# Patient Record
Sex: Female | Born: 1945 | Race: White | Hispanic: No | Marital: Married | Smoking: Former smoker
Health system: Southern US, Community
[De-identification: ages and names within clinical notes are randomized; demographics above are authoritative.]

## PROBLEM LIST (undated history)

## (undated) DIAGNOSIS — E1140 Type 2 diabetes mellitus with diabetic neuropathy, unspecified: Secondary | ICD-10-CM

## (undated) DIAGNOSIS — N390 Urinary tract infection, site not specified: Secondary | ICD-10-CM

## (undated) DIAGNOSIS — F419 Anxiety disorder, unspecified: Secondary | ICD-10-CM

## (undated) DIAGNOSIS — E785 Hyperlipidemia, unspecified: Secondary | ICD-10-CM

## (undated) DIAGNOSIS — E119 Type 2 diabetes mellitus without complications: Secondary | ICD-10-CM

## (undated) DIAGNOSIS — K0889 Other specified disorders of teeth and supporting structures: Secondary | ICD-10-CM

## (undated) DIAGNOSIS — E113591 Type 2 diabetes mellitus with proliferative diabetic retinopathy without macular edema, right eye: Secondary | ICD-10-CM

## (undated) DIAGNOSIS — Z89511 Acquired absence of right leg below knee: Secondary | ICD-10-CM

## (undated) DIAGNOSIS — W010XXA Fall on same level from slipping, tripping and stumbling without subsequent striking against object, initial encounter: Secondary | ICD-10-CM

## (undated) DIAGNOSIS — D649 Anemia, unspecified: Secondary | ICD-10-CM

## (undated) DIAGNOSIS — E049 Nontoxic goiter, unspecified: Secondary | ICD-10-CM

## (undated) DIAGNOSIS — I509 Heart failure, unspecified: Secondary | ICD-10-CM

## (undated) DIAGNOSIS — K5900 Constipation, unspecified: Secondary | ICD-10-CM

## (undated) DIAGNOSIS — K219 Gastro-esophageal reflux disease without esophagitis: Secondary | ICD-10-CM

## (undated) DIAGNOSIS — I10 Essential (primary) hypertension: Secondary | ICD-10-CM

## (undated) DIAGNOSIS — I7300 Raynaud's syndrome without gangrene: Secondary | ICD-10-CM

## (undated) DIAGNOSIS — R112 Nausea with vomiting, unspecified: Secondary | ICD-10-CM

## (undated) DIAGNOSIS — N200 Calculus of kidney: Secondary | ICD-10-CM

## (undated) DIAGNOSIS — K429 Umbilical hernia without obstruction or gangrene: Secondary | ICD-10-CM

## (undated) DIAGNOSIS — I6529 Occlusion and stenosis of unspecified carotid artery: Secondary | ICD-10-CM

## (undated) DIAGNOSIS — E1149 Type 2 diabetes mellitus with other diabetic neurological complication: Secondary | ICD-10-CM

## (undated) DIAGNOSIS — Z9289 Personal history of other medical treatment: Secondary | ICD-10-CM

## (undated) DIAGNOSIS — Z720 Tobacco use: Secondary | ICD-10-CM

## (undated) DIAGNOSIS — E559 Vitamin D deficiency, unspecified: Secondary | ICD-10-CM

## (undated) DIAGNOSIS — I219 Acute myocardial infarction, unspecified: Secondary | ICD-10-CM

## (undated) DIAGNOSIS — I739 Peripheral vascular disease, unspecified: Secondary | ICD-10-CM

## (undated) DIAGNOSIS — G458 Other transient cerebral ischemic attacks and related syndromes: Secondary | ICD-10-CM

## (undated) DIAGNOSIS — I2510 Atherosclerotic heart disease of native coronary artery without angina pectoris: Secondary | ICD-10-CM

## (undated) DIAGNOSIS — N209 Urinary calculus, unspecified: Secondary | ICD-10-CM

## (undated) DIAGNOSIS — Z9889 Other specified postprocedural states: Secondary | ICD-10-CM

## (undated) HISTORY — DX: Essential (primary) hypertension: I10

## (undated) HISTORY — DX: Other transient cerebral ischemic attacks and related syndromes: G45.8

## (undated) HISTORY — PX: CAROTID ENDARTERECTOMY: SUR193

## (undated) HISTORY — DX: Nontoxic goiter, unspecified: E04.9

## (undated) HISTORY — PX: ABDOMINAL HYSTERECTOMY: SHX81

## (undated) HISTORY — DX: Type 2 diabetes mellitus with diabetic neuropathy, unspecified: E11.40

## (undated) HISTORY — DX: Peripheral vascular disease, unspecified: I73.9

## (undated) HISTORY — PX: TONSILLECTOMY AND ADENOIDECTOMY: SUR1326

## (undated) HISTORY — DX: Type 2 diabetes mellitus with other diabetic neurological complication: E11.49

## (undated) HISTORY — DX: Vitamin D deficiency, unspecified: E55.9

## (undated) HISTORY — DX: Fall on same level from slipping, tripping and stumbling without subsequent striking against object, initial encounter: W01.0XXA

## (undated) HISTORY — DX: Raynaud's syndrome without gangrene: I73.00

## (undated) HISTORY — PX: CORONARY ANGIOPLASTY WITH STENT PLACEMENT: SHX49

## (undated) HISTORY — DX: Acquired absence of right leg below knee: Z89.511

## (undated) HISTORY — DX: Tobacco use: Z72.0

## (undated) HISTORY — DX: Hyperlipidemia, unspecified: E78.5

## (undated) HISTORY — DX: Occlusion and stenosis of unspecified carotid artery: I65.29

## (undated) HISTORY — PX: FEMORAL ARTERY - POPLITEAL ARTERY BYPASS GRAFT: SUR180

## (undated) HISTORY — DX: Atherosclerotic heart disease of native coronary artery without angina pectoris: I25.10

## (undated) HISTORY — PX: VESICOVAGINAL FISTULA CLOSURE W/ TAH: SUR271

## (undated) HISTORY — DX: Type 2 diabetes mellitus with proliferative diabetic retinopathy without macular edema, right eye: E11.3591

## (undated) HISTORY — PX: HERNIA REPAIR: SHX51

## (undated) HISTORY — PX: BREAST LUMPECTOMY: SHX2

---

## 1999-05-24 ENCOUNTER — Other Ambulatory Visit: Admission: RE | Admit: 1999-05-24 | Discharge: 1999-05-24 | Payer: Self-pay | Admitting: Obstetrics and Gynecology

## 2000-08-16 ENCOUNTER — Other Ambulatory Visit: Admission: RE | Admit: 2000-08-16 | Discharge: 2000-08-16 | Payer: Self-pay | Admitting: Obstetrics and Gynecology

## 2001-07-05 ENCOUNTER — Encounter: Payer: Self-pay | Admitting: Emergency Medicine

## 2001-07-05 ENCOUNTER — Inpatient Hospital Stay (HOSPITAL_COMMUNITY): Admission: EM | Admit: 2001-07-05 | Discharge: 2001-07-06 | Payer: Self-pay | Admitting: Emergency Medicine

## 2001-07-07 ENCOUNTER — Encounter: Payer: Self-pay | Admitting: Emergency Medicine

## 2001-07-07 ENCOUNTER — Inpatient Hospital Stay (HOSPITAL_COMMUNITY): Admission: EM | Admit: 2001-07-07 | Discharge: 2001-07-11 | Payer: Self-pay | Admitting: Emergency Medicine

## 2001-07-08 ENCOUNTER — Encounter: Payer: Self-pay | Admitting: Internal Medicine

## 2001-07-10 ENCOUNTER — Encounter: Payer: Self-pay | Admitting: Cardiology

## 2001-07-10 ENCOUNTER — Encounter: Payer: Self-pay | Admitting: Internal Medicine

## 2003-09-02 ENCOUNTER — Encounter (INDEPENDENT_AMBULATORY_CARE_PROVIDER_SITE_OTHER): Payer: Self-pay | Admitting: Specialist

## 2003-09-02 ENCOUNTER — Inpatient Hospital Stay (HOSPITAL_COMMUNITY): Admission: RE | Admit: 2003-09-02 | Discharge: 2003-09-03 | Payer: Self-pay | Admitting: Vascular Surgery

## 2004-02-03 ENCOUNTER — Encounter: Admission: RE | Admit: 2004-02-03 | Discharge: 2004-03-30 | Payer: Self-pay | Admitting: Endocrinology

## 2004-06-21 ENCOUNTER — Ambulatory Visit: Payer: Self-pay | Admitting: Internal Medicine

## 2004-06-29 ENCOUNTER — Ambulatory Visit: Payer: Self-pay | Admitting: Internal Medicine

## 2005-05-20 ENCOUNTER — Ambulatory Visit: Payer: Self-pay | Admitting: Internal Medicine

## 2005-06-03 ENCOUNTER — Ambulatory Visit: Payer: Self-pay

## 2005-06-20 ENCOUNTER — Ambulatory Visit: Payer: Self-pay | Admitting: Cardiology

## 2005-09-13 ENCOUNTER — Ambulatory Visit: Payer: Self-pay | Admitting: Cardiology

## 2006-06-28 ENCOUNTER — Ambulatory Visit: Payer: Self-pay | Admitting: Internal Medicine

## 2006-08-15 ENCOUNTER — Ambulatory Visit: Payer: Self-pay | Admitting: Internal Medicine

## 2006-09-04 ENCOUNTER — Ambulatory Visit: Payer: Self-pay | Admitting: Cardiology

## 2006-09-12 ENCOUNTER — Ambulatory Visit: Payer: Self-pay

## 2006-09-20 ENCOUNTER — Ambulatory Visit: Payer: Self-pay | Admitting: Cardiology

## 2006-09-20 LAB — CONVERTED CEMR LAB
BUN: 18 mg/dL (ref 6–23)
Basophils Absolute: 0 10*3/uL (ref 0.0–0.1)
Basophils Relative: 0.4 % (ref 0.0–1.0)
CO2: 32 meq/L (ref 19–32)
Calcium: 9.5 mg/dL (ref 8.4–10.5)
Chloride: 103 meq/L (ref 96–112)
Creatinine, Ser: 0.9 mg/dL (ref 0.4–1.2)
Eosinophils Absolute: 0.1 10*3/uL (ref 0.0–0.6)
Eosinophils Relative: 0.8 % (ref 0.0–5.0)
GFR calc Af Amer: 82 mL/min
GFR calc non Af Amer: 68 mL/min
Glucose, Bld: 141 mg/dL — ABNORMAL HIGH (ref 70–99)
HCT: 38.9 % (ref 36.0–46.0)
Hemoglobin: 13.9 g/dL (ref 12.0–15.0)
INR: 0.9 (ref 0.9–2.0)
Lymphocytes Relative: 23.2 % (ref 12.0–46.0)
MCHC: 35.6 g/dL (ref 30.0–36.0)
MCV: 87.9 fL (ref 78.0–100.0)
Monocytes Absolute: 0.4 10*3/uL (ref 0.2–0.7)
Monocytes Relative: 6.4 % (ref 3.0–11.0)
Neutro Abs: 4.7 10*3/uL (ref 1.4–7.7)
Neutrophils Relative %: 69.2 % (ref 43.0–77.0)
Platelets: 356 10*3/uL (ref 150–400)
Potassium: 3.9 meq/L (ref 3.5–5.1)
Prothrombin Time: 11.4 s (ref 10.0–14.0)
RBC: 4.43 M/uL (ref 3.87–5.11)
RDW: 13.9 % (ref 11.5–14.6)
Sodium: 143 meq/L (ref 135–145)
WBC: 6.8 10*3/uL (ref 4.5–10.5)
aPTT: 29.3 s (ref 26.5–36.5)

## 2006-09-27 ENCOUNTER — Ambulatory Visit: Payer: Self-pay | Admitting: Cardiology

## 2006-09-27 ENCOUNTER — Ambulatory Visit (HOSPITAL_COMMUNITY): Admission: RE | Admit: 2006-09-27 | Discharge: 2006-09-28 | Payer: Self-pay | Admitting: Cardiology

## 2006-10-05 ENCOUNTER — Ambulatory Visit: Payer: Self-pay | Admitting: Cardiology

## 2006-10-06 ENCOUNTER — Ambulatory Visit (HOSPITAL_COMMUNITY): Admission: RE | Admit: 2006-10-06 | Discharge: 2006-10-06 | Payer: Self-pay | Admitting: Cardiology

## 2006-10-11 ENCOUNTER — Ambulatory Visit: Payer: Self-pay

## 2006-10-11 ENCOUNTER — Ambulatory Visit: Payer: Self-pay | Admitting: Cardiology

## 2007-04-11 ENCOUNTER — Encounter (HOSPITAL_BASED_OUTPATIENT_CLINIC_OR_DEPARTMENT_OTHER): Admission: RE | Admit: 2007-04-11 | Discharge: 2007-05-02 | Payer: Self-pay | Admitting: Surgery

## 2007-04-18 ENCOUNTER — Ambulatory Visit: Payer: Self-pay

## 2007-04-19 ENCOUNTER — Ambulatory Visit: Payer: Self-pay | Admitting: Cardiovascular Disease

## 2007-04-25 ENCOUNTER — Encounter: Payer: Self-pay | Admitting: Internal Medicine

## 2007-04-25 ENCOUNTER — Ambulatory Visit: Payer: Self-pay | Admitting: Vascular Surgery

## 2007-05-30 ENCOUNTER — Ambulatory Visit: Payer: Self-pay | Admitting: Vascular Surgery

## 2007-06-04 ENCOUNTER — Ambulatory Visit: Payer: Self-pay | Admitting: Vascular Surgery

## 2007-06-05 ENCOUNTER — Inpatient Hospital Stay (HOSPITAL_COMMUNITY): Admission: RE | Admit: 2007-06-05 | Discharge: 2007-06-08 | Payer: Self-pay | Admitting: Vascular Surgery

## 2007-06-05 ENCOUNTER — Other Ambulatory Visit: Payer: Self-pay | Admitting: Vascular Surgery

## 2007-06-22 ENCOUNTER — Ambulatory Visit: Payer: Self-pay | Admitting: Vascular Surgery

## 2007-07-09 ENCOUNTER — Ambulatory Visit: Payer: Self-pay | Admitting: Vascular Surgery

## 2007-07-09 ENCOUNTER — Inpatient Hospital Stay (HOSPITAL_COMMUNITY): Admission: RE | Admit: 2007-07-09 | Discharge: 2007-07-11 | Payer: Self-pay | Admitting: Vascular Surgery

## 2007-07-10 ENCOUNTER — Encounter: Payer: Self-pay | Admitting: Vascular Surgery

## 2007-08-10 ENCOUNTER — Ambulatory Visit: Payer: Self-pay | Admitting: Vascular Surgery

## 2007-10-05 ENCOUNTER — Ambulatory Visit: Payer: Self-pay | Admitting: Vascular Surgery

## 2007-10-26 ENCOUNTER — Ambulatory Visit: Payer: Self-pay | Admitting: Vascular Surgery

## 2007-10-30 ENCOUNTER — Ambulatory Visit: Payer: Self-pay | Admitting: Vascular Surgery

## 2007-10-30 ENCOUNTER — Inpatient Hospital Stay (HOSPITAL_COMMUNITY): Admission: RE | Admit: 2007-10-30 | Discharge: 2007-11-01 | Payer: Self-pay | Admitting: Vascular Surgery

## 2007-10-31 ENCOUNTER — Encounter: Payer: Self-pay | Admitting: Vascular Surgery

## 2007-11-23 ENCOUNTER — Ambulatory Visit: Payer: Self-pay | Admitting: Vascular Surgery

## 2008-02-01 ENCOUNTER — Ambulatory Visit: Payer: Self-pay | Admitting: Vascular Surgery

## 2008-05-09 ENCOUNTER — Ambulatory Visit: Payer: Self-pay | Admitting: Vascular Surgery

## 2008-05-27 ENCOUNTER — Ambulatory Visit (HOSPITAL_BASED_OUTPATIENT_CLINIC_OR_DEPARTMENT_OTHER): Admission: RE | Admit: 2008-05-27 | Discharge: 2008-05-27 | Payer: Self-pay | Admitting: Orthopedic Surgery

## 2008-08-15 ENCOUNTER — Ambulatory Visit: Payer: Self-pay | Admitting: Vascular Surgery

## 2008-10-11 DIAGNOSIS — I739 Peripheral vascular disease, unspecified: Secondary | ICD-10-CM | POA: Insufficient documentation

## 2008-10-11 DIAGNOSIS — E1149 Type 2 diabetes mellitus with other diabetic neurological complication: Secondary | ICD-10-CM | POA: Insufficient documentation

## 2008-10-11 DIAGNOSIS — I10 Essential (primary) hypertension: Secondary | ICD-10-CM | POA: Insufficient documentation

## 2008-10-11 DIAGNOSIS — E785 Hyperlipidemia, unspecified: Secondary | ICD-10-CM

## 2009-02-25 ENCOUNTER — Ambulatory Visit: Payer: Self-pay | Admitting: *Deleted

## 2009-02-25 ENCOUNTER — Inpatient Hospital Stay (HOSPITAL_COMMUNITY): Admission: EM | Admit: 2009-02-25 | Discharge: 2009-02-27 | Payer: Self-pay | Admitting: Emergency Medicine

## 2009-03-12 ENCOUNTER — Ambulatory Visit: Payer: Self-pay | Admitting: Cardiology

## 2009-03-12 ENCOUNTER — Encounter: Payer: Self-pay | Admitting: Nurse Practitioner

## 2009-03-12 DIAGNOSIS — I2510 Atherosclerotic heart disease of native coronary artery without angina pectoris: Secondary | ICD-10-CM | POA: Insufficient documentation

## 2009-03-12 DIAGNOSIS — I6529 Occlusion and stenosis of unspecified carotid artery: Secondary | ICD-10-CM

## 2009-03-31 ENCOUNTER — Telehealth: Payer: Self-pay | Admitting: Cardiovascular Disease

## 2009-04-10 ENCOUNTER — Encounter: Payer: Self-pay | Admitting: Cardiovascular Disease

## 2009-04-10 ENCOUNTER — Ambulatory Visit: Payer: Self-pay | Admitting: Vascular Surgery

## 2009-05-23 ENCOUNTER — Inpatient Hospital Stay (HOSPITAL_COMMUNITY): Admission: EM | Admit: 2009-05-23 | Discharge: 2009-05-25 | Payer: Self-pay | Admitting: Emergency Medicine

## 2009-05-23 ENCOUNTER — Ambulatory Visit: Payer: Self-pay | Admitting: Cardiology

## 2009-06-04 ENCOUNTER — Encounter (INDEPENDENT_AMBULATORY_CARE_PROVIDER_SITE_OTHER): Payer: Self-pay | Admitting: *Deleted

## 2009-06-05 ENCOUNTER — Ambulatory Visit: Payer: Self-pay | Admitting: Cardiovascular Disease

## 2009-10-16 ENCOUNTER — Ambulatory Visit: Payer: Self-pay | Admitting: Cardiovascular Disease

## 2009-11-04 ENCOUNTER — Inpatient Hospital Stay (HOSPITAL_COMMUNITY): Admission: EM | Admit: 2009-11-04 | Discharge: 2009-11-05 | Payer: Self-pay | Admitting: Emergency Medicine

## 2009-11-04 ENCOUNTER — Encounter: Payer: Self-pay | Admitting: Cardiovascular Disease

## 2009-11-04 ENCOUNTER — Ambulatory Visit: Payer: Self-pay | Admitting: Internal Medicine

## 2009-12-10 ENCOUNTER — Ambulatory Visit: Payer: Self-pay | Admitting: Cardiovascular Disease

## 2010-02-12 ENCOUNTER — Ambulatory Visit: Payer: Self-pay | Admitting: Vascular Surgery

## 2010-03-23 ENCOUNTER — Ambulatory Visit: Payer: Self-pay | Admitting: Vascular Surgery

## 2010-03-30 ENCOUNTER — Ambulatory Visit (HOSPITAL_COMMUNITY): Admission: RE | Admit: 2010-03-30 | Discharge: 2010-03-30 | Payer: Self-pay | Admitting: Surgery

## 2010-03-30 ENCOUNTER — Ambulatory Visit: Payer: Self-pay | Admitting: Surgery

## 2010-04-22 ENCOUNTER — Ambulatory Visit: Payer: Self-pay | Admitting: Vascular Surgery

## 2010-05-04 ENCOUNTER — Ambulatory Visit: Payer: Self-pay | Admitting: Vascular Surgery

## 2010-06-09 ENCOUNTER — Encounter: Payer: Self-pay | Admitting: Cardiovascular Disease

## 2010-06-09 ENCOUNTER — Ambulatory Visit: Payer: Self-pay | Admitting: Cardiovascular Disease

## 2010-06-17 ENCOUNTER — Telehealth (INDEPENDENT_AMBULATORY_CARE_PROVIDER_SITE_OTHER): Payer: Self-pay | Admitting: *Deleted

## 2010-07-06 ENCOUNTER — Ambulatory Visit
Admission: RE | Admit: 2010-07-06 | Discharge: 2010-07-06 | Payer: Self-pay | Source: Home / Self Care | Attending: Vascular Surgery | Admitting: Vascular Surgery

## 2010-07-09 LAB — URINALYSIS, ROUTINE W REFLEX MICROSCOPIC
Bilirubin Urine: NEGATIVE
Hemoglobin, Urine: NEGATIVE
Ketones, ur: NEGATIVE mg/dL
Nitrite: NEGATIVE
Protein, ur: NEGATIVE mg/dL
Specific Gravity, Urine: 1.013 (ref 1.005–1.030)
Urine Glucose, Fasting: NEGATIVE mg/dL
Urobilinogen, UA: 0.2 mg/dL (ref 0.0–1.0)
pH: 5.5 (ref 5.0–8.0)

## 2010-07-09 LAB — COMPREHENSIVE METABOLIC PANEL
ALT: 14 U/L (ref 0–35)
AST: 19 U/L (ref 0–37)
Albumin: 3.5 g/dL (ref 3.5–5.2)
Alkaline Phosphatase: 73 U/L (ref 39–117)
BUN: 19 mg/dL (ref 6–23)
CO2: 30 mEq/L (ref 19–32)
Calcium: 9.6 mg/dL (ref 8.4–10.5)
Chloride: 101 mEq/L (ref 96–112)
Creatinine, Ser: 0.92 mg/dL (ref 0.4–1.2)
GFR calc Af Amer: >60 mL/min (ref >60)
GFR calc non Af Amer: >60 mL/min (ref >60)
Glucose, Bld: 159 mg/dL — ABNORMAL HIGH (ref 70–99)
Potassium: 4.9 mEq/L (ref 3.5–5.1)
Sodium: 138 mEq/L (ref 135–145)
Total Bilirubin: 0.3 mg/dL (ref 0.3–1.2)
Total Protein: 6.4 g/dL (ref 6.0–8.3)

## 2010-07-09 LAB — PROTIME-INR
INR: 0.89 (ref 0.00–1.49)
Prothrombin Time: 12.3 seconds (ref 11.6–15.2)

## 2010-07-09 LAB — CBC
HCT: 35.1 % — ABNORMAL LOW (ref 36.0–46.0)
Hemoglobin: 10.9 g/dL — ABNORMAL LOW (ref 12.0–15.0)
MCH: 25.8 pg — ABNORMAL LOW (ref 26.0–34.0)
MCHC: 31.1 g/dL (ref 30.0–36.0)
MCV: 83 fL (ref 78.0–100.0)
Platelets: 269 10*3/uL (ref 150–400)
RBC: 4.23 MIL/uL (ref 3.87–5.11)
RDW: 15.6 % — ABNORMAL HIGH (ref 11.5–15.5)
WBC: 5.8 10*3/uL (ref 4.0–10.5)

## 2010-07-09 LAB — TYPE AND SCREEN
ABO/RH(D): O POS
Antibody Screen: NEGATIVE

## 2010-07-09 LAB — APTT: aPTT: 29 seconds (ref 24–37)

## 2010-07-12 ENCOUNTER — Encounter: Payer: Self-pay | Admitting: Vascular Surgery

## 2010-07-12 ENCOUNTER — Inpatient Hospital Stay (HOSPITAL_COMMUNITY)
Admission: RE | Admit: 2010-07-12 | Discharge: 2010-07-13 | Payer: Self-pay | Source: Home / Self Care | Attending: Vascular Surgery | Admitting: Vascular Surgery

## 2010-07-19 LAB — GLUCOSE, CAPILLARY
Glucose-Capillary: 156 mg/dL — ABNORMAL HIGH (ref 70–99)
Glucose-Capillary: 134 mg/dL — ABNORMAL HIGH (ref 70–99)
Glucose-Capillary: 148 mg/dL — ABNORMAL HIGH (ref 70–99)
Glucose-Capillary: 183 mg/dL — ABNORMAL HIGH (ref 70–99)
Glucose-Capillary: 250 mg/dL — ABNORMAL HIGH (ref 70–99)
Glucose-Capillary: 213 mg/dL — ABNORMAL HIGH (ref 70–99)

## 2010-07-19 LAB — SURGICAL PCR SCREEN
MRSA, PCR: NEGATIVE
Staphylococcus aureus: POSITIVE — AB

## 2010-07-27 ENCOUNTER — Ambulatory Visit: Admit: 2010-07-27 | Payer: Self-pay | Admitting: Vascular Surgery

## 2010-07-27 ENCOUNTER — Ambulatory Visit
Admission: RE | Admit: 2010-07-27 | Discharge: 2010-07-27 | Payer: Self-pay | Source: Home / Self Care | Attending: Vascular Surgery | Admitting: Vascular Surgery

## 2010-07-28 NOTE — Assessment & Plan Note (Signed)
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  This was done on 07/13/2010.  She did well in the hospital and was discharged home on postoperative day #1.  She reports this has resolved her calf claudication.  Her groin incision has healed quite nicely.  Her ankle-arm index remains stable at 0.76 on the left and 0.55 on the right.  She will continue her walking program and will see [place] again in 3 months for continued follow-up.  Again, I talked with patient and her husband present explaining that she has had diffuse extensive peripheral vascular occlusive disease, and unfortunately I would predict that this may continue to be a lifelong problem.  We will continue her active surveillance.    Larina Earthly, M.D. Electronically Signed  TFE/MEDQ  D:  07/27/2010  T:  07/28/2010  Job:  [id number]

## 2010-08-05 NOTE — Progress Notes (Signed)
Summary: refil meds  Phone Note Refill Request Call back at Home Phone [phone] Message from:  Patient on June 17, 2010 12:41 PM  Refills Requested: Medication #1:  NITROGLYCERIN 0.4 MG SUBL One tablet under tongue every 5 minutes as needed for chest pain---may repeat times three [facility] [address]   Caller: Patient Reason for Call: Talk to Nurse Summary of Call: does pt have any refills left on this meds. Initial call taken by: Lorne Skeens,  June 17, 2010 12:42 PM  Follow-up for Phone Call        Rx faxed to pharmacy. Vikki Ports  June 17, 2010 3:26 PM     Prescriptions: NITROGLYCERIN 0.4 MG SUBL (NITROGLYCERIN) One tablet under tongue every 5 minutes as needed for chest pain---may repeat times three  #25 x 2   Entered by:   Vikki Ports   Authorized by:   Norva Karvonen, MD   Signed by:   Vikki Ports on 06/17/2010   Method used:   Faxed to ...       [facility]  [phone]* (retail)       [address] [place], [place]  [address]       Ph: [phone] or [phone]       Fax: [phone]   RxID:   [id number]

## 2010-08-05 NOTE — Assessment & Plan Note (Signed)
Summary: [name]   Visit Type:  Follow-up Primary [name]:  Dr Evlyn Kanner  CC:  6 month ROV; No complaints; last stents x4 on Sept. 27 and 2011.  History of Present Illness: This is a 65 year old woman with coronary and peripheral arterial disease. She presented with unstable angina and 2010 and underwent PCI with a drug-eluting stent to the left circumflex. She had chest pain later in the year and had a relook catheterization at that time. This demonstrated patency of her stent and no change in her coronary anatomy.  She is doing well at present. No chest pain, dyspnea, palps, edema, or other cardiac complaints. She is limited by left leg claudication. She underwent extensive peripheral stenting by Dr Myra Gianotti and is scheduled to have a left femoral endarterectomy by Dr Arbie Cookey next month. She denies right leg claudication at present.        Current Medications (verified): 1)  Ramipril 2.5 Mg Caps (Ramipril) .... Take One Capsule By Mouth Daily 2)  Dexilant 60 Mg Cpdr (Dexlansoprazole) .... Take 1 By Mouth Two Times A Day 3)  Plavix 75 Mg Tabs (Clopidogrel Bisulfate) .... Take One Tablet By Mouth Daily 4)  Nitroglycerin 0.4 Mg Subl (Nitroglycerin) .... One Tablet Under Tongue Every 5 Minutes As Needed For Chest Pain---May Repeat Times Three 5)  Toprol Xl 25 Mg Xr24h-Tab (Metoprolol Succinate) .... Take 1 Tablet By Mouth Once A Day 6)  Alprazolam 0.25 Mg Tabs (Alprazolam) .... Take 1 Tablet By Mouth Once A Day 7)  Aspirin Ec 325 Mg Tbec (Aspirin) .... Take One Tablet By Mouth Daily 8)  Glyburide 2.5 Mg Tabs (Glyburide) .... Take 1 Tablet By Mouth Once A Day 9)  Hydrochlorothiazide 25 Mg Tabs (Hydrochlorothiazide) .... Take One Tablet By Mouth Daily. 10)  Januvia 100 Mg Tabs (Sitagliptin Phosphate) .... Take 1 Tablet By Mouth Once A Day 11)  Lovaza 1 Gm Caps (Omega-3-Acid Ethyl Esters) .... 2 Capsules Twice A Day 12)  Actoplus Met 15-500 Mg Tabs (Pioglitazone Hcl-Metformin Hcl) .... Take 1 Tablet  By Mouth Once A Day 13)  Isosorbide Mononitrate Cr 30 Mg Xr24h-Tab (Isosorbide Mononitrate) .... Take One-Half  Tablet By Mouth Every Morning and Every Evening 14)  Tums 500 Mg Chew (Calcium Carbonate Antacid) .... As Needed  Allergies: 1)  ! Codeine 2)  ! Erythromycin 3)  ! * Propoxyphene Hcl 4)  ! * Statins Drugs 5)  ! * Hydromorphone 6)  ! * Acetaminophen  Past History:  Past medical history reviewed for relevance to current acute and chronic problems.  Past Medical History: Reviewed history from 03/12/2009 and no changes required. CAD      a.  s/p NSTEMI 02/2009 - PCI LCX with Xience DES.  Otherwise branch vessel and Dist. RCA dzs.  NL EF. HYPERLIPIDEMIA-MIXED (ICD-272.4) HYPERTENSION, UNSPECIFIED (ICD-401.9) DIABETES MELLITUS (ICD-250.00) UNSPECIFIED PERIPHERAL VASCULAR DISEASE (ICD-443.9)      a. s/p L CEA      b. s/p B fem-pop bypass REMOTE TOB ABUSE  Review of Systems       Negative except as per HPI   Vital Signs:  Patient profile:   65 year old female Height:      64.25 inches Weight:      170.75 pounds BMI:     29.19 Pulse rate:   66 / minute Pulse rhythm:   regular Resp:     14 per minute BP sitting:   110 / 54  (left arm) Cuff size:   regular  Vitals Entered By:  Stanton Kidney, EMT-P (June 09, 2010 3:48 PM)  Physical Exam  General:  Pt is alert and oriented, in no acute distress. HEENT: normal Neck: normal carotid upstrokes with bilateral bruits, JVP normal Lungs: CTA CV: RRR without murmur or gallop Abd: soft, NT, positive BS, no bruit, no organomegaly Ext: trace bilateral edema.  Skin: warm and dry without rash    EKG  Procedure date:  06/09/2010  Findings:      NSR within normal limits 69 bpm  Impression & Recommendations:  Problem # 1:  CORONARY ATHEROSCLEROSIS NATIVE CORONARY ARTERY (ICD-414.01) Stable without angina. Reduce ASA to 81 mg in the setting of long-term plavix. Continue ACE and Beta blocker.  Her updated medication  list for this problem includes:    Ramipril 2.5 Mg Caps (Ramipril) .Marland Kitchen... Take one capsule by mouth daily    Plavix 75 Mg Tabs (Clopidogrel bisulfate) .Marland Kitchen... Take one tablet by mouth daily    Nitroglycerin 0.4 Mg Subl (Nitroglycerin) ..... One tablet under tongue every 5 minutes as needed for chest pain---may repeat times three    Toprol Xl 25 Mg Xr24h-tab (Metoprolol succinate) .Marland Kitchen... Take 1 tablet by mouth once a day    Aspirin 81 Mg Tbec (Aspirin) .Marland Kitchen... Take one tablet by mouth daily    Isosorbide Mononitrate Cr 30 Mg Xr24h-tab (Isosorbide mononitrate) .Marland Kitchen... Take one-half  tablet by mouth every morning and every evening  Orders: EKG w/ Interpretation (93000)  Problem # 2:  UNSPECIFIED PERIPHERAL VASCULAR DISEASE (ICD-443.9) Followed by [facility] with upcoming femoral endarterectomy.  Problem # 3:  HYPERTENSION, UNSPECIFIED (ICD-401.9) Well-controlled.  Her updated medication list for this problem includes:    Ramipril 2.5 Mg Caps (Ramipril) .Marland Kitchen... Take one capsule by mouth daily    Toprol Xl 25 Mg Xr24h-tab (Metoprolol succinate) .Marland Kitchen... Take 1 tablet by mouth once a day    Aspirin 81 Mg Tbec (Aspirin) .Marland Kitchen... Take one tablet by mouth daily    Hydrochlorothiazide 25 Mg Tabs (Hydrochlorothiazide) .Marland Kitchen... Take one tablet by mouth daily.  Orders: EKG w/ Interpretation (93000)  BP today: 110/54 Prior BP: 117/73 (12/10/2009)  Labs Reviewed: K+: 3.9 (09/20/2006) Creat: : 0.9 (09/20/2006)     Problem # 4:  HYPERLIPIDEMIA-MIXED (ICD-272.4) Statin-intolerant. Followed by Dr Evlyn Kanner.  Her updated medication list for this problem includes:    Lovaza 1 Gm Caps (Omega-3-acid ethyl esters) .Marland Kitchen... 2 capsules twice a day  Patient Instructions: 1)  Your physician has recommended you make the following change in your medication: DECREASE Aspirin to 81mg  once a day 2)  Your physician wants you to follow-up in:   6 MONTHS. You will receive a reminder letter in the mail two months in advance. If you don't  receive a letter, please call our office to schedule the follow-up appointment.

## 2010-08-05 NOTE — Assessment & Plan Note (Signed)
Summary: eph/ gd   Visit Type:  Follow-up Primary Provider:  Dr Evlyn Kanner  CC:  check up.  History of Present Illness: This is a 65 year old woman with coronary and peripheral arterial disease. She presented with unstable angina and 2010 and underwent PCI with a drug-eluting stent to the left circumflex. She had chest pain later in the year and had a relook catheterization at that time. This demonstrated patency of her stent and no change in her coronary anatomy. She developed recurrent chest pain and I tried to manage her medically, but she again was hospitalized and underwent repeat cath. This showed stable coronary anatomy and documented stent patency.  She feels great over the past month. States chest pains have resolved and denies exeritonal dyspnea. No other complaints at present.    Current Medications (verified): 1)  Ramipril 2.5 Mg Caps (Ramipril) .... Take One Capsule By Mouth Daily 2)  Protonix 40 Mg Tbec (Pantoprazole Sodium) .... Take One Tablet By Mouth Daily 3)  Plavix 75 Mg Tabs (Clopidogrel Bisulfate) .... Take One Tablet By Mouth Daily 4)  Nitroglycerin 0.4 Mg Subl (Nitroglycerin) .... One Tablet Under Tongue Every 5 Minutes As Needed For Chest Pain---May Repeat Times Three 5)  Toprol Xl 25 Mg Xr24h-Tab (Metoprolol Succinate) .... Take 1 Tablet By Mouth Once A Day 6)  Alprazolam 0.25 Mg Tabs (Alprazolam) .... Take 1 Tablet By Mouth Once A Day 7)  Aspirin Ec 325 Mg Tbec (Aspirin) .... Take One Tablet By Mouth Daily 8)  Glyburide 2.5 Mg Tabs (Glyburide) .... Take 1 Tablet By Mouth Once A Day 9)  Hydrochlorothiazide 25 Mg Tabs (Hydrochlorothiazide) .... Take One Tablet By Mouth Daily. 10)  Januvia 100 Mg Tabs (Sitagliptin Phosphate) .... Take 1 Tablet By Mouth Once A Day 11)  Lovaza 1 Gm Caps (Omega-3-Acid Ethyl Esters) .... 2 Capsules Twice A Day 12)  Actoplus Met 15-500 Mg Tabs (Pioglitazone Hcl-Metformin Hcl) .... Take 1 Tablet By Mouth Once A Day 13)  Isosorbide Mononitrate  Cr 30 Mg Xr24h-Tab (Isosorbide Mononitrate) .... Take One-Half  Tablet By Mouth Every Morning and Every Evening 14)  Tums 500 Mg Chew (Calcium Carbonate Antacid) .... As Needed  Allergies: 1)  ! Codeine 2)  ! Erythromycin 3)  ! * Propoxyphene Hcl 4)  ! * Statins Drugs 5)  ! * Hydromorphone 6)  ! * Acetaminophen  Past History:  Past Surgical History: Last updated: 10/11/2008 CABG 2004 Hysterectomy  lumpectomy  Past medical history reviewed for relevance to current acute and chronic problems.  Past Medical History: Reviewed history from 03/12/2009 and no changes required. CAD      a.  s/p NSTEMI 02/2009 - PCI LCX with Xience DES.  Otherwise branch vessel and Dist. RCA dzs.  NL EF. HYPERLIPIDEMIA-MIXED (ICD-272.4) HYPERTENSION, UNSPECIFIED (ICD-401.9) DIABETES MELLITUS (ICD-250.00) UNSPECIFIED PERIPHERAL VASCULAR DISEASE (ICD-443.9)      a. s/p L CEA      b. s/p B fem-pop bypass REMOTE TOB ABUSE  Vital Signs:  Patient profile:   65 year old female Height:      64 inches Weight:      170 pounds BMI:     29.29 Pulse rate:   86 / minute Resp:     18 per minute BP sitting:   117 / 73  (left arm)  Vitals Entered By: Kem Parkinson (December 10, 2009 2:33 PM)  Physical Exam  General:  Pt is alert and oriented, in no acute distress. HEENT: normal Neck: normal carotid upstrokes with  bilateral bruits, JVP normal Lungs: CTA CV: RRR without murmur or gallop Abd: soft, NT, positive BS, no bruit, no organomegaly Ext: trace bilateral edema.  Skin: warm and dry without rash    Impression & Recommendations:  Problem # 1:  CORONARY ATHEROSCLEROSIS NATIVE CORONARY ARTERY (ICD-414.01) Stable without angina. tolerating her current medical therapy well. Continue ASA and plavix. No change in meds today. Will f/u in 6 months.  Her updated medication list for this problem includes:    Ramipril 2.5 Mg Caps (Ramipril) .Marland Kitchen... Take one capsule by mouth daily    Plavix 75 Mg Tabs  (Clopidogrel bisulfate) .Marland Kitchen... Take one tablet by mouth daily    Nitroglycerin 0.4 Mg Subl (Nitroglycerin) ..... One tablet under tongue every 5 minutes as needed for chest pain---may repeat times three    Toprol Xl 25 Mg Xr24h-tab (Metoprolol succinate) .Marland Kitchen... Take 1 tablet by mouth once a day    Aspirin Ec 325 Mg Tbec (Aspirin) .Marland Kitchen... Take one tablet by mouth daily    Isosorbide Mononitrate Cr 30 Mg Xr24h-tab (Isosorbide mononitrate) .Marland Kitchen... Take one-half  tablet by mouth every morning and every evening  Problem # 2:  HYPERLIPIDEMIA-MIXED (ICD-272.4) Followed by Dr Evlyn Kanner. Statin-intolerant.  Her updated medication list for this problem includes:    Lovaza 1 Gm Caps (Omega-3-acid ethyl esters) .Marland Kitchen... 2 capsules twice a day  Problem # 3:  HYPERTENSION, UNSPECIFIED (ICD-401.9) BP well-controlled.  Her updated medication list for this problem includes:    Ramipril 2.5 Mg Caps (Ramipril) .Marland Kitchen... Take one capsule by mouth daily    Toprol Xl 25 Mg Xr24h-tab (Metoprolol succinate) .Marland Kitchen... Take 1 tablet by mouth once a day    Aspirin Ec 325 Mg Tbec (Aspirin) .Marland Kitchen... Take one tablet by mouth daily    Hydrochlorothiazide 25 Mg Tabs (Hydrochlorothiazide) .Marland Kitchen... Take one tablet by mouth daily.  BP today: 117/73 Prior BP: 150/72 (10/16/2009)  Labs Reviewed: K+: 3.9 (09/20/2006) Creat: : 0.9 (09/20/2006)

## 2010-08-05 NOTE — Assessment & Plan Note (Signed)
Summary: pt having shoulder pain/night sweats/tingling in left hand/lg   Visit Type:  Follow-up Primary Provider:  Dr Evlyn Kanner  CC:  some chest discomfort (xanax helps) / night sweats / some tingling in left hand.  History of Present Illness: This is a 65 year old woman with coronary and peripheral arterial disease. She presented with unstable angina and 2010 and underwent PCI with a drug-eluting stent to the left circumflex. She had chest pain later in the year and had a relook catheterization at that time. This demonstrated patency of her stent and no change in her coronary anatomy.  The patient reports some left-sided chest discomfort at present. She also has tingling in her left shoulder and tingling in her left fingers. She has been under a good deal of stress recently and is worried about taxes. She denies exertional symptoms. Her symptoms over the past few weeks are unlike those when she presented with unstable angina last summer. She denies dyspnea, edema, orthopnea, or PND.  Current Medications (verified): 1)  Ramipril 2.5 Mg Caps (Ramipril) .... Take One Capsule By Mouth Daily 2)  Protonix 40 Mg Tbec (Pantoprazole Sodium) .... Take One Tablet By Mouth Daily 3)  Plavix 75 Mg Tabs (Clopidogrel Bisulfate) .... Take One Tablet By Mouth Daily 4)  Nitroglycerin 0.4 Mg Subl (Nitroglycerin) .... One Tablet Under Tongue Every 5 Minutes As Needed For Chest Pain---May Repeat Times Three 5)  Toprol Xl 25 Mg Xr24h-Tab (Metoprolol Succinate) .... Take 1 Tablet By Mouth Once A Day 6)  Alprazolam 0.25 Mg Tabs (Alprazolam) .... Take 1 Tablet By Mouth Once A Day 7)  Aspirin Ec 325 Mg Tbec (Aspirin) .... Take One Tablet By Mouth Daily 8)  Glyburide 2.5 Mg Tabs (Glyburide) .... Take 1 Tablet By Mouth Once A Day 9)  Hydrochlorothiazide 25 Mg Tabs (Hydrochlorothiazide) .... Take One Tablet By Mouth Daily. 10)  Januvia 100 Mg Tabs (Sitagliptin Phosphate) .... Take 1 Tablet By Mouth Once A Day 11)  Lovaza 1  Gm Caps (Omega-3-Acid Ethyl Esters) .... 2 Capsules Twice A Day 12)  Actoplus Met 15-500 Mg Tabs (Pioglitazone Hcl-Metformin Hcl) .... Take 1 Tablet By Mouth Once A Day 13)  Isosorbide Mononitrate Cr 30 Mg Xr24h-Tab (Isosorbide Mononitrate) .... Take One-Half  Tablet By Mouth Every Morning and Every Evening  Allergies: 1)  ! Codeine 2)  ! Erythromycin 3)  ! * Propoxyphene Hcl 4)  ! * Statins Drugs 5)  ! * Hydromorphone 6)  ! * Acetaminophen  Past History:  Past medical history reviewed for relevance to current acute and chronic problems.  Past Medical History: Reviewed history from 03/12/2009 and no changes required. CAD      a.  s/p NSTEMI 02/2009 - PCI LCX with Xience DES.  Otherwise branch vessel and Dist. RCA dzs.  NL EF. HYPERLIPIDEMIA-MIXED (ICD-272.4) HYPERTENSION, UNSPECIFIED (ICD-401.9) DIABETES MELLITUS (ICD-250.00) UNSPECIFIED PERIPHERAL VASCULAR DISEASE (ICD-443.9)      a. s/p L CEA      b. s/p B fem-pop bypass REMOTE TOB ABUSE  Review of Systems       Positive for night sweats and lateral calf claudication. Otherwise negative except as per history of present illness.  Vital Signs:  Patient profile:   65 year old female Height:      64 inches Weight:      173 pounds BMI:     29.80 Pulse rate:   76 / minute BP sitting:   150 / 72  (left arm) Cuff size:   regular  Vitals  Entered By: Hardin Negus, RMA (October 16, 2009 2:34 PM)  Physical Exam  General:  Pt is alert and oriented, in no acute distress. HEENT: normal Neck: normal carotid upstrokes with bilateral bruits, JVP normal Lungs: CTA CV: RRR without murmur or gallop Abd: soft, NT, positive BS, no bruit, no organomegaly Ext: trace bilateral pretibial edema.  Skin: warm and dry without rash    EKG  Procedure date:  10/16/2009  Findings:      Normal sinus rhythm, heart rate 76 beats per minute, within normal limits  Impression & Recommendations:  Problem # 1:  CORONARY ATHEROSCLEROSIS NATIVE  CORONARY ARTERY (ICD-414.01) The patient has chest pain with both typical and atypical features.  She had a re-look cardiac catheterization last Fall after developing recurrent chest pain. She is going through a period of very high stress and I think her pain symptoms are likely related to this. She expressed a good deal of relief with seeing her EKG is normal.  I would favor watching her for the next few weeks as the stress of tax season decreases and if she continues to have chest discomfort, and would move forward with a stress Myoview scan. If symptoms improve or resolve, would continue her current medical program. She will contact the office if she has continued symptoms. I'll plan to see her back in followup in 3 months for Her updated medication list for this problem includes:    Ramipril 2.5 Mg Caps (Ramipril) .Marland Kitchen... Take one capsule by mouth daily    Plavix 75 Mg Tabs (Clopidogrel bisulfate) .Marland Kitchen... Take one tablet by mouth daily    Nitroglycerin 0.4 Mg Subl (Nitroglycerin) ..... One tablet under tongue every 5 minutes as needed for chest pain---may repeat times three    Toprol Xl 25 Mg Xr24h-tab (Metoprolol succinate) .Marland Kitchen... Take 1 tablet by mouth once a day    Aspirin Ec 325 Mg Tbec (Aspirin) .Marland Kitchen... Take one tablet by mouth daily    Isosorbide Mononitrate Cr 30 Mg Xr24h-tab (Isosorbide mononitrate) .Marland Kitchen... Take one-half  tablet by mouth every morning and every evening  Orders: EKG w/ Interpretation (93000)  Patient Instructions: 1)  Your physician recommends that you schedule a follow-up appointment in: 3 months--ms Bord--if you have another episode of chest pain, please call us --[phone]

## 2010-08-10 ENCOUNTER — Other Ambulatory Visit: Payer: Self-pay

## 2010-08-10 ENCOUNTER — Ambulatory Visit: Payer: Self-pay | Admitting: Vascular Surgery

## 2010-08-31 ENCOUNTER — Other Ambulatory Visit: Payer: Self-pay

## 2010-09-01 ENCOUNTER — Encounter (INDEPENDENT_AMBULATORY_CARE_PROVIDER_SITE_OTHER): Payer: Medicare Other

## 2010-09-01 ENCOUNTER — Ambulatory Visit (INDEPENDENT_AMBULATORY_CARE_PROVIDER_SITE_OTHER): Payer: Medicare Other | Admitting: Vascular Surgery

## 2010-09-01 DIAGNOSIS — I739 Peripheral vascular disease, unspecified: Secondary | ICD-10-CM

## 2010-09-01 DIAGNOSIS — Z48812 Encounter for surgical aftercare following surgery on the circulatory system: Secondary | ICD-10-CM

## 2010-09-01 DIAGNOSIS — M79609 Pain in unspecified limb: Secondary | ICD-10-CM

## 2010-09-06 ENCOUNTER — Inpatient Hospital Stay (HOSPITAL_COMMUNITY)
Admission: RE | Admit: 2010-09-06 | Discharge: 2010-09-08 | DRG: 254 | Disposition: A | Discharge status: Home / Self Care | Payer: Medicare Other | Source: Ambulatory Visit | Attending: Vascular Surgery | Admitting: Vascular Surgery

## 2010-09-06 ENCOUNTER — Inpatient Hospital Stay (HOSPITAL_COMMUNITY): Payer: Medicare Other

## 2010-09-06 DIAGNOSIS — I70219 Atherosclerosis of native arteries of extremities with intermittent claudication, unspecified extremity: Secondary | ICD-10-CM

## 2010-09-06 DIAGNOSIS — E119 Type 2 diabetes mellitus without complications: Secondary | ICD-10-CM | POA: Diagnosis present

## 2010-09-06 DIAGNOSIS — Z7982 Long term (current) use of aspirin: Secondary | ICD-10-CM

## 2010-09-06 DIAGNOSIS — I2510 Atherosclerotic heart disease of native coronary artery without angina pectoris: Secondary | ICD-10-CM | POA: Diagnosis present

## 2010-09-06 DIAGNOSIS — I10 Essential (primary) hypertension: Secondary | ICD-10-CM | POA: Diagnosis present

## 2010-09-06 DIAGNOSIS — E7800 Pure hypercholesterolemia, unspecified: Secondary | ICD-10-CM | POA: Diagnosis present

## 2010-09-06 DIAGNOSIS — I70229 Atherosclerosis of native arteries of extremities with rest pain, unspecified extremity: Principal | ICD-10-CM | POA: Diagnosis present

## 2010-09-06 DIAGNOSIS — Z9861 Coronary angioplasty status: Secondary | ICD-10-CM

## 2010-09-06 DIAGNOSIS — Z87891 Personal history of nicotine dependence: Secondary | ICD-10-CM

## 2010-09-06 DIAGNOSIS — D649 Anemia, unspecified: Secondary | ICD-10-CM | POA: Diagnosis not present

## 2010-09-06 DIAGNOSIS — K219 Gastro-esophageal reflux disease without esophagitis: Secondary | ICD-10-CM | POA: Diagnosis present

## 2010-09-06 DIAGNOSIS — Z7902 Long term (current) use of antithrombotics/antiplatelets: Secondary | ICD-10-CM

## 2010-09-06 LAB — COMPREHENSIVE METABOLIC PANEL
ALT: 14 U/L (ref 0–35)
Alkaline Phosphatase: 71 U/L (ref 39–117)
BUN: 19 mg/dL (ref 6–23)
Chloride: 103 mEq/L (ref 96–112)
Glucose, Bld: 185 mg/dL — ABNORMAL HIGH (ref 70–99)
Potassium: 3.9 mEq/L (ref 3.5–5.1)
Sodium: 139 mEq/L (ref 135–145)
Total Bilirubin: 0.4 mg/dL (ref 0.3–1.2)
Total Protein: 6.2 g/dL (ref 6.0–8.3)

## 2010-09-06 LAB — URINALYSIS, ROUTINE W REFLEX MICROSCOPIC
Bilirubin Urine: NEGATIVE
Leukocytes, UA: NEGATIVE
Nitrite: NEGATIVE
Specific Gravity, Urine: 1.011 (ref 1.005–1.030)
Urobilinogen, UA: 0.2 mg/dL (ref 0.0–1.0)
pH: 5.5 (ref 5.0–8.0)

## 2010-09-06 LAB — URINE MICROSCOPIC-ADD ON

## 2010-09-06 LAB — CBC
HCT: 31.7 % — ABNORMAL LOW (ref 36.0–46.0)
Hemoglobin: 10 g/dL — ABNORMAL LOW (ref 12.0–15.0)
MCV: 77.9 fL — ABNORMAL LOW (ref 78.0–100.0)
RBC: 4.07 MIL/uL (ref 3.87–5.11)
RDW: 15.6 % — ABNORMAL HIGH (ref 11.5–15.5)
WBC: 4.4 10*3/uL (ref 4.0–10.5)

## 2010-09-06 LAB — GLUCOSE, CAPILLARY: Glucose-Capillary: 171 mg/dL — ABNORMAL HIGH (ref 70–99)

## 2010-09-06 LAB — SURGICAL PCR SCREEN: Staphylococcus aureus: NEGATIVE

## 2010-09-06 LAB — TYPE AND SCREEN

## 2010-09-06 LAB — PROTIME-INR: Prothrombin Time: 12.9 seconds (ref 11.6–15.2)

## 2010-09-06 NOTE — Assessment & Plan Note (Signed)
OFFICE VISIT  [name], [name] DOB:  19-Oct-1945                                       09/01/2010 EAVWU#:[id number]  The patient presents today for right foot pain.  She has an extensive past history, dating back many years, of lower extremity revascularizations.  She underwent a redo right fem-pop bypass in April 2009.  She subsequently had recurrent ischemic symptoms in September 2011.  Had arteriogram by Dr. Myra Gianotti.  Had irregular tight stenoses in her popliteal artery and had stenting of this.  She has had drop-off in her ankle-arm index since that time, but was tolerating her level of claudication.  She comes in today and has been having rest pain for the last 2 nights and blanched, pale right great toe.  She does have an ankle-arm index of 0.2 now.  She does have patency throughout her graft and has apparently re-occluded or narrowed the below-knee popliteal area.  I have recommended an above-knee graft to below-knee popliteal bypass.  I explained that we would image this with ultrasound to see if she has small saphenous vein, acceptable caliber for jump graft.  If not, we would put Propaten Gore-Tex graft extension to the below-knee position.  She and her husband are going out of town tomorrow to the [employer] and will return Sunday, and then are leaving 2 days later for [place], [place]. for another trip, will return on March 11th.  I explained that with her having rest pain that she may or may not be able to tolerate this level of ischemia.  I do not feel that she has imminent threatened limb loss.  They will contact us should she have worsening claudication.  We have tentatively scheduled her for surgery for 3/12 unless she has worsening symptoms and we will proceed sooner than that.    Larina Earthly, M.D. Electronically Signed  TFE/MEDQ  D:  09/01/2010  T:  09/02/2010  Job:  [id number]

## 2010-09-07 LAB — GLUCOSE, CAPILLARY
Glucose-Capillary: 196 mg/dL — ABNORMAL HIGH (ref 70–99)
Glucose-Capillary: 186 mg/dL — ABNORMAL HIGH (ref 70–99)

## 2010-09-07 LAB — CBC
HCT: 25.4 % — ABNORMAL LOW (ref 36.0–46.0)
Hemoglobin: 8.1 g/dL — ABNORMAL LOW (ref 12.0–15.0)
MCHC: 31.9 g/dL (ref 30.0–36.0)
MCV: 79.4 fL (ref 78.0–100.0)
WBC: 7.5 10*3/uL (ref 4.0–10.5)

## 2010-09-07 LAB — BASIC METABOLIC PANEL
BUN: 10 mg/dL (ref 6–23)
CO2: 28 mEq/L (ref 19–32)
Glucose, Bld: 186 mg/dL — ABNORMAL HIGH (ref 70–99)
Potassium: 3.9 mEq/L (ref 3.5–5.1)
Sodium: 135 mEq/L (ref 135–145)

## 2010-09-07 NOTE — Op Note (Addendum)
NAMEKAMARIA, LUCIA             ACCOUNT NO.:  [id number]  MEDICAL RECORD NO.:  [id number]           PATIENT TYPE:  I  LOCATION:  3309                         FACILITY:  [facility]  PHYSICIAN:  Larina Earthly, M.D.    DATE OF BIRTH:  Feb 25, 1946  DATE OF PROCEDURE:  09/06/2010 DATE OF DISCHARGE:                              OPERATIVE REPORT   PREOPERATIVE DIAGNOSIS:  Right foot ischemia.  POSTOPERATIVE DIAGNOSIS:  Right foot ischemia.  PROCEDURE:  Intraoperative arteriogram and bypass from the right femoral to above-knee popliteal bypass.  The proximal anastomosis to the below- knee popliteal artery is the distal anastomosis with 6-mm thin wall Propaten Gore-Tex graft.  SURGEON:  Larina Earthly, MD  ASSISTANT:  Di Kindle. Edilia Bo, MD  ANESTHESIA:  General endotracheal.  COMPLICATIONS:  None.  DISPOSITION:  Recovery room, stable.  PROCEDURE IN DETAIL:  The patient was taken to the operating room and placed supine in position where the right leg was prepped and draped in usual sterile fashion.  Incision was made from the medial aspect of the above-knee popliteal to the prior scar, carried down to isolate the prior graft.  The graft did have a pulse at this level.  Intraoperative arteriogram was obtained.  This showed the patency of the below-knee popliteal artery and runoff into the peroneal artery.  There was a narrowing at the anastomosis of the prior above-knee popliteal bypass and also in the popliteal artery.  The below-knee popliteal artery was exposed through a separate incision at the medial approach to an old scar from a vein harvest.  The artery was of good caliber, had mild posterior plaquing and had no pulse.  Tunnel was created from the below- knee popliteal artery to the area of the prior above-knee popliteal bypass.  The patient was given 7000 units of intravenous heparin and after adequate circulation time, the Gore-Tex popliteal artery bypass was occluded  above the prior anastomosis and was opened with 11 blade an ellipse of graft was removed.  A new interposition graft of 6-mm Propaten Gore-Tex was brought onto the field, was spatulated and sewn end-to-side to the graft with a running 6-0 Prolene suture.  This was then brought through the prior subcutaneous tunnel down to the level of the below-knee popliteal artery.  The below-knee popliteal artery was occluded proximally and distally, and was opened with an 11 blade and extended longitudinally with Pott scissors.  The patient did have plaque in the posterior popliteal artery but was of good caliber.  A three dilator passed through this without resistance.  The graft was cut to appropriate length, was spatulated and sewn end-to-side to the below- knee popliteal artery with a running 6-0 Prolene suture.  Clamps were removed and a good flow was noted in the below-knee popliteal artery and also peroneal signal was noted at the foot.  This was a graft dependent. The patient was given 100 mg of protamine to reverse the heparin.  The wound was irrigated with saline.  Hemostasis with electrocautery.  The fascia was closed with 2-0 Vicryl suture, and the skin was closed with 3-0 subcuticular Vicryl stitch.  Sterile  dressing was applied.  The patient was taken to the recovery room in stable condition.     Larina Earthly, M.D.     TFE/MEDQ  D:  09/06/2010  T:  09/07/2010  Job:  [id number]  Electronically Signed by Travia Onstad Leavy Heatherly M.D. on 09/07/2010 10:02:20 AM Electronically Signed by Phelan Goers Geet Hosking M.D. on 09/07/2010 10:08:50 AM Electronically Signed by Tawanna Cooler Deyon Chizek M.D. on 09/07/2010 10:17:47 AM Electronically Signed by Tawanna Cooler Kelvon Giannini M.D. on 09/07/2010 10:26:56 AM Electronically Signed by Tawanna Cooler Samariah Hokenson M.D. on 09/07/2010 10:35:52 AM Electronically Signed by Tawanna Cooler Kartel Wolbert M.D. on 09/07/2010 10:45:01 AM Electronically Signed by Tawanna Cooler Ahmoni Edge M.D. on 09/07/2010 10:55:04 AM Electronically Signed by Tawanna Cooler Heena Woodbury M.D. on  09/07/2010 11:05:07 AM Electronically Signed by Tawanna Cooler Stefany Starace M.D. on 09/07/2010 11:15:25 AM Electronically Signed by Tawanna Cooler Kamrynn Melott M.D. on 09/07/2010 11:27:31 AM Electronically Signed by Tawanna Cooler Lendon George M.D. on 09/07/2010 11:40:45 AM Electronically Signed by Tawanna Cooler Landy Mace M.D. on 09/07/2010 11:54:13 AM Electronically Signed by Tawanna Cooler Jyaire Koudelka M.D. on 09/07/2010 12:08:46 PM Electronically Signed by Tawanna Cooler Arleigh Odowd M.D. on 09/07/2010 12:24:16 PM Electronically Signed by Tawanna Cooler Michella Detjen M.D. on 09/07/2010 12:40:31 PM Electronically Signed by Tawanna Cooler Taylan Marez M.D. on 09/07/2010 12:58:40 PM Electronically Signed by Tawanna Cooler Lars Jeziorski M.D. on 09/07/2010 01:17:28 PM Electronically Signed by Tawanna Cooler Najee Manninen M.D. on 09/07/2010 01:39:19 PM Electronically Signed by Tawanna Cooler Tivon Lemoine M.D. on 09/07/2010 01:59:14 PM Electronically Signed by Tawanna Cooler Kanai Hilger M.D. on 09/07/2010 02:19:31 PM Electronically Signed by Tawanna Cooler Fatima Fedie M.D. on 09/07/2010 02:41:41 PM Electronically Signed by Tawanna Cooler Flonnie Wierman M.D. on 09/07/2010 03:05:09 PM Electronically Signed by Tawanna Cooler Kylee Nardozzi M.D. on 09/07/2010 03:29:56 PM Electronically Signed by Tawanna Cooler Stephfon Bovey M.D. on 09/07/2010 03:56:06 PM Electronically Signed by Tawanna Cooler Taym Twist M.D. on 09/07/2010 04:03:53 PM Electronically Signed by Tawanna Cooler Bertice Risse M.D. on 09/07/2010 04:35:10 PM Electronically Signed by Tawanna Cooler Mychal Decarlo M.D. on 09/07/2010 05:06:37 PM Electronically Signed by Tawanna Cooler Curties Conigliaro M.D. on 09/07/2010 05:06:37 PM Electronically Signed by Tawanna Cooler Koi Yarbro M.D. on 09/07/2010 05:34:31 PM Electronically Signed by Tawanna Cooler Tauri Ethington M.D. on 09/07/2010 06:24:20 PM Electronically Signed by Tawanna Cooler Jozlin Bently M.D. on 09/07/2010 07:43:20 PM

## 2010-09-08 LAB — GLUCOSE, CAPILLARY: Glucose-Capillary: 142 mg/dL — ABNORMAL HIGH (ref 70–99)

## 2010-09-08 NOTE — Procedures (Unsigned)
BYPASS GRAFT EVALUATION  INDICATION:  Follow up, right bypass graft.  HISTORY: Diabetes:  Yes. Cardiac:  No. Hypertension:  Yes. Smoking:  Previous. Previous Surgery:  Bilateral common iliac, right external iliac artery, popliteal stents.  Re-do of a right femoral-to-popliteal bypass graft, 10/30/07.  Left femoral-to-popliteal bypass graft, 07/09/07.  A left femoral endarterectomy and Dacron patch angioplasty, 07/13/2010.  SINGLE LEVEL ARTERIAL EXAM                              RIGHT              LEFT Brachial:                    138                121 Anterior tibial:             Inaudible          20 Posterior tibial:            28                 127 Peroneal: Ankle/brachial index:        0.20               0.92  PREVIOUS ABI:  Date: 07/27/10  RIGHT:  0.55  LEFT:  0.76  LOWER EXTREMITY BYPASS GRAFT DUPLEX EXAM:  DUPLEX:  Patent right femoral-to-popliteal bypass graft with elevated velocities at the right inflow at 2.51 m/s.  The remaining body of the graft had monophasic waveforms with decreased velocities.  IMPRESSION: 1. A drop in the ankle brachial index on the right.  Left increased     from previous study. 2. Patent right femoral-to-popliteal bypass graft, as described above.  I discussed these findings with Dr. Arbie Cookey at 1600.  ___________________________________________ Larina Earthly, M.D.  OD/MEDQ  D:  09/02/2010  T:  09/02/2010  Job:  [id number]

## 2010-09-09 NOTE — Discharge Summary (Addendum)
[name], [name]             ACCOUNT NO.:  [id number]  MEDICAL RECORD NO.:  [id number]           PATIENT TYPE:  I  LOCATION:  2021                         FACILITY:  [facility]  PHYSICIAN:  Larina Earthly, M.D.    DATE OF BIRTH:  Jul 07, 1945  DATE OF ADMISSION:  09/06/2010 DATE OF DISCHARGE:  09/08/2010                              DISCHARGE SUMMARY   CHIEF COMPLAINT:  Right foot ischemia.  HISTORY OF PRESENT ILLNESS:  [name] is a 65 year old woman who has had multiple revascularizations in the right lower extremity.  She had a redo right fem-pop bypass in April 2009 and is subsequently having recurrent ischemia symptoms since September 2011.  Angiogram by Dr. Myra Gianotti, she had no regular tight stenosis in her popliteal artery and this was stented.  Post stenting, she is on some claudication, but she was tolerating this well.  However, by late February, she suddenly had some rest pain with a blanched, pale right great toe.  Her ankle brachial index decreased to 0.2.  She had patency throughout her graft and it is apparently re-occluded __________ below-knee popliteal artery. It was recommended that she have an above-knee to below-knee popliteal graft bypass and she was admitted to the hospital for this purpose.  PAST MEDICAL HISTORY: 1. Type 2 diabetes. 2. Peripheral vascular disease. 3. Hypercholesterolemia. 4. Hypertension. 5. Coronary artery disease.  HOSPITAL COURSE:  The patient was taken to the operating room on September 06, 2010, for right femoral to above-knee popliteal bypass with 6-mm thin- walled Propaten Gore-Tex graft.  Postoperatively, the patient did well. Foot was warm and pink.  She had palpable PT pulse in the right lower extremity.  She had some postop anemia with a hematocrit of 25.4 and was started on some iron.  Pain was well controlled with her Demerol p.o. that she takes at home and she was discharged on September 07, 2010, to follow up with Dr. Arbie Cookey in 2-3  weeks.  FINAL DIAGNOSIS:  Right leg ischemia with rest pain status post right femoral to below-knee popliteal artery bypass grafting with return palpable posterior tibial pulse in the right foot.  All of her other chronic medical issues were stable while in-house and controlled with her preoperative medications.  DISPOSITION:  The patient will be discharged to home and she will follow up with Dr. Arbie Cookey in 2-3 weeks.  DISCHARGE MEDICATIONS: 1. Actoplus Met 15/500 mg daily at bedtime often every 4 hours as     needed for congestion. 2. Alprazolam 0.25 mg daily. 3. Altace 2.5 mg daily. 4. Aspirin 325 mg daily. 5. Compazine 10 mg every 6 hours as needed for nausea and prescription     was given for 30 tablets. 6. Demerol 25 mg 1-2 tablets every 4 hours as needed for pain. 7. Dexilant 60 mg twice daily. 8. Glyburide 2.5 mg every morning. 9. Hydrochlorothiazide 25 mg daily. 10.Imdur 30 mg twice daily. 11.Januvia 100 mg daily at bedtime. 12.Lovaza 1 g twice daily. 13.Nitroglycerin 0.4 mg as needed for chest pain. 14.Plavix 75 mg daily. 15.An application daily of triamcinolone ointment as needed. 16.Tums as needed. 17.Toprol-XL 25 mg daily.  Della Goo, PA-C   ______________________________ Larina Earthly, M.D.    RR/MEDQ  D:  09/08/2010  T:  09/08/2010  Job:  [id number]  Electronically Signed by TODD EARLY M.D. on 09/08/2010 02:40:19 PM Electronically Signed by Della Goo PA on 09/11/2010 09:02:11 AM

## 2010-09-16 LAB — GLUCOSE, CAPILLARY
Glucose-Capillary: 115 mg/dL — ABNORMAL HIGH (ref 70–99)
Glucose-Capillary: 146 mg/dL — ABNORMAL HIGH (ref 70–99)
Glucose-Capillary: 155 mg/dL — ABNORMAL HIGH (ref 70–99)

## 2010-09-16 LAB — POCT I-STAT, CHEM 8
Calcium, Ion: 1.18 mmol/L (ref 1.12–1.32)
Chloride: 103 mEq/L (ref 96–112)
HCT: 42 % (ref 36.0–46.0)
Potassium: 3.5 mEq/L (ref 3.5–5.1)
Sodium: 139 mEq/L (ref 135–145)

## 2010-09-21 LAB — URINALYSIS, ROUTINE W REFLEX MICROSCOPIC
Hgb urine dipstick: NEGATIVE
Nitrite: NEGATIVE
Protein, ur: NEGATIVE mg/dL
Urobilinogen, UA: 0.2 mg/dL (ref 0.0–1.0)

## 2010-09-21 LAB — POCT CARDIAC MARKERS
CKMB, poc: <1 ng/mL — ABNORMAL LOW (ref 1.0–8.0)
Myoglobin, poc: 34.2 ng/mL (ref 12–200)
Myoglobin, poc: 43.4 ng/mL (ref 12–200)
Troponin i, poc: <0.05 ng/mL (ref 0.00–0.09)

## 2010-09-21 LAB — COMPREHENSIVE METABOLIC PANEL
ALT: 12 U/L (ref 0–35)
AST: 14 U/L (ref 0–37)
Albumin: 3.3 g/dL — ABNORMAL LOW (ref 3.5–5.2)
Alkaline Phosphatase: 60 U/L (ref 39–117)
CO2: 31 mEq/L (ref 19–32)
Calcium: 9.6 mg/dL (ref 8.4–10.5)
GFR calc Af Amer: >60 mL/min (ref >60)
Glucose, Bld: 169 mg/dL — ABNORMAL HIGH (ref 70–99)
Potassium: 3.6 mEq/L (ref 3.5–5.1)
Sodium: 138 mEq/L (ref 135–145)
Total Protein: 6.5 g/dL (ref 6.0–8.3)

## 2010-09-21 LAB — GLUCOSE, CAPILLARY: Glucose-Capillary: 108 mg/dL — ABNORMAL HIGH (ref 70–99)

## 2010-09-21 LAB — DIFFERENTIAL
Basophils Relative: 0 % (ref 0–1)
Eosinophils Absolute: 0.1 10*3/uL (ref 0.0–0.7)
Eosinophils Relative: 1 % (ref 0–5)
Lymphs Abs: 1 10*3/uL (ref 0.7–4.0)
Monocytes Absolute: 0.4 10*3/uL (ref 0.1–1.0)
Monocytes Relative: 5 % (ref 3–12)

## 2010-09-21 LAB — URINE CULTURE

## 2010-09-21 LAB — CK TOTAL AND CKMB (NOT AT ARMC)
CK, MB: 0.7 ng/mL (ref 0.3–4.0)
Total CK: 33 U/L (ref 7–177)

## 2010-09-21 LAB — CBC
Hemoglobin: 13.2 g/dL (ref 12.0–15.0)
MCHC: 34.8 g/dL (ref 30.0–36.0)
RDW: 14.9 % (ref 11.5–15.5)

## 2010-09-21 LAB — PROTIME-INR: INR: 1 (ref 0.00–1.49)

## 2010-09-28 ENCOUNTER — Ambulatory Visit (INDEPENDENT_AMBULATORY_CARE_PROVIDER_SITE_OTHER): Payer: Medicare Other | Admitting: Vascular Surgery

## 2010-09-28 ENCOUNTER — Encounter (INDEPENDENT_AMBULATORY_CARE_PROVIDER_SITE_OTHER): Payer: Medicare Other

## 2010-09-28 DIAGNOSIS — I70219 Atherosclerosis of native arteries of extremities with intermittent claudication, unspecified extremity: Secondary | ICD-10-CM

## 2010-09-28 DIAGNOSIS — I739 Peripheral vascular disease, unspecified: Secondary | ICD-10-CM

## 2010-09-28 DIAGNOSIS — Z48812 Encounter for surgical aftercare following surgery on the circulatory system: Secondary | ICD-10-CM

## 2010-09-28 NOTE — Assessment & Plan Note (Signed)
OFFICE VISIT  [name], [name] DOB:  1945/08/16                                       09/28/2010 ZOXWR#:[id number]  Patient presents today for follow-up of her recent bypass from her above- knee to below-knee popliteal artery with 6 mm Propaten graft.  This was on 09/06/10.  She had critical limb ischemia prior to this with a very ischemic foot and severe rest pain.  She has completely resolved the rest pain, and her incisions are all healing quite nicely.  She has a well-perfused foot.  I do not palpate pulses.  She does have significant swelling.  Her ankle-arm index is improved to 0.65 on the right and is stable at 0.99 on the left.  She will continue her walking program.  We will see her again in 3 months with repeat ankle-arm indices.    Larina Earthly, M.D. Electronically Signed  TFE/MEDQ  D:  09/28/2010  T:  09/28/2010  Job:  [id number]

## 2010-10-06 LAB — DIFFERENTIAL
Basophils Relative: 0 % (ref 0–1)
Eosinophils Absolute: 0 10*3/uL (ref 0.0–0.7)
Eosinophils Relative: 1 % (ref 0–5)
Monocytes Absolute: 0.4 10*3/uL (ref 0.1–1.0)
Monocytes Relative: 8 % (ref 3–12)

## 2010-10-06 LAB — GLUCOSE, CAPILLARY
Glucose-Capillary: 118 mg/dL — ABNORMAL HIGH (ref 70–99)
Glucose-Capillary: 123 mg/dL — ABNORMAL HIGH (ref 70–99)
Glucose-Capillary: 144 mg/dL — ABNORMAL HIGH (ref 70–99)
Glucose-Capillary: 160 mg/dL — ABNORMAL HIGH (ref 70–99)
Glucose-Capillary: 164 mg/dL — ABNORMAL HIGH (ref 70–99)
Glucose-Capillary: 190 mg/dL — ABNORMAL HIGH (ref 70–99)

## 2010-10-06 LAB — CK TOTAL AND CKMB (NOT AT ARMC)
CK, MB: 1.2 ng/mL (ref 0.3–4.0)
Relative Index: INVALID (ref 0.0–2.5)
Total CK: 42 U/L (ref 7–177)

## 2010-10-06 LAB — LIPID PANEL
Cholesterol: 210 mg/dL — ABNORMAL HIGH (ref 0–200)
HDL: 52 mg/dL (ref >39)
LDL Cholesterol: 136 mg/dL — ABNORMAL HIGH (ref 0–99)
Triglycerides: 109 mg/dL (ref <150)

## 2010-10-06 LAB — URINALYSIS, ROUTINE W REFLEX MICROSCOPIC
Bilirubin Urine: NEGATIVE
Hgb urine dipstick: NEGATIVE
Ketones, ur: NEGATIVE mg/dL
Nitrite: NEGATIVE
Specific Gravity, Urine: 1.02 (ref 1.005–1.030)
Urobilinogen, UA: 0.2 mg/dL (ref 0.0–1.0)

## 2010-10-06 LAB — BASIC METABOLIC PANEL
CO2: 31 mEq/L (ref 19–32)
Calcium: 9.3 mg/dL (ref 8.4–10.5)
Creatinine, Ser: 0.86 mg/dL (ref 0.4–1.2)
GFR calc Af Amer: >60 mL/min (ref >60)
GFR calc non Af Amer: >60 mL/min (ref >60)

## 2010-10-06 LAB — HEMOGLOBIN A1C: Mean Plasma Glucose: 146 mg/dL

## 2010-10-06 LAB — CARDIAC PANEL(CRET KIN+CKTOT+MB+TROPI)
CK, MB: 1.2 ng/mL (ref 0.3–4.0)
CK, MB: 1.3 ng/mL (ref 0.3–4.0)
Relative Index: INVALID (ref 0.0–2.5)
Relative Index: INVALID (ref 0.0–2.5)
Total CK: 45 U/L (ref 7–177)
Troponin I: 0.08 ng/mL — ABNORMAL HIGH (ref 0.00–0.06)
Troponin I: 0.09 ng/mL — ABNORMAL HIGH (ref 0.00–0.06)

## 2010-10-06 LAB — CBC
Hemoglobin: 13.9 g/dL (ref 12.0–15.0)
MCHC: 34.4 g/dL (ref 30.0–36.0)
Platelets: 267 10*3/uL (ref 150–400)
Platelets: 269 10*3/uL (ref 150–400)
RBC: 4.33 MIL/uL (ref 3.87–5.11)
RBC: 4.79 MIL/uL (ref 3.87–5.11)
RDW: 15.6 % — ABNORMAL HIGH (ref 11.5–15.5)
WBC: 4.1 10*3/uL (ref 4.0–10.5)
WBC: 5.5 10*3/uL (ref 4.0–10.5)
WBC: 6.8 10*3/uL (ref 4.0–10.5)

## 2010-10-06 LAB — COMPREHENSIVE METABOLIC PANEL
ALT: 16 U/L (ref 0–35)
Albumin: 3.6 g/dL (ref 3.5–5.2)
Alkaline Phosphatase: 64 U/L (ref 39–117)
Chloride: 99 mEq/L (ref 96–112)
Potassium: 4.6 mEq/L (ref 3.5–5.1)
Sodium: 136 mEq/L (ref 135–145)
Total Bilirubin: 1.5 mg/dL — ABNORMAL HIGH (ref 0.3–1.2)
Total Protein: 6.8 g/dL (ref 6.0–8.3)

## 2010-10-06 LAB — POCT CARDIAC MARKERS
CKMB, poc: <1 ng/mL — ABNORMAL LOW (ref 1.0–8.0)
Troponin i, poc: <0.05 ng/mL (ref 0.00–0.09)

## 2010-10-06 LAB — HEPARIN LEVEL (UNFRACTIONATED)
Heparin Unfractionated: 0.58 IU/mL (ref 0.30–0.70)
Heparin Unfractionated: 0.62 IU/mL (ref 0.30–0.70)

## 2010-10-06 LAB — APTT: aPTT: 96 seconds — ABNORMAL HIGH (ref 24–37)

## 2010-10-09 LAB — BASIC METABOLIC PANEL
BUN: 6 mg/dL (ref 6–23)
BUN: 7 mg/dL (ref 6–23)
CO2: 24 mEq/L (ref 19–32)
Calcium: 8.7 mg/dL (ref 8.4–10.5)
Calcium: 9.3 mg/dL (ref 8.4–10.5)
Chloride: 98 mEq/L (ref 96–112)
Creatinine, Ser: 0.64 mg/dL (ref 0.4–1.2)
Creatinine, Ser: 0.98 mg/dL (ref 0.4–1.2)
GFR calc Af Amer: >60 mL/min (ref >60)
GFR calc Af Amer: >60 mL/min (ref >60)
GFR calc non Af Amer: >60 mL/min (ref >60)
Potassium: 3.6 mEq/L (ref 3.5–5.1)
Sodium: 139 mEq/L (ref 135–145)

## 2010-10-09 LAB — CBC
HCT: 41.2 % (ref 36.0–46.0)
Hemoglobin: 13.9 g/dL (ref 12.0–15.0)
MCHC: 33.8 g/dL (ref 30.0–36.0)
MCHC: 34.2 g/dL (ref 30.0–36.0)
MCV: 88 fL (ref 78.0–100.0)
Platelets: 278 10*3/uL (ref 150–400)
Platelets: 291 10*3/uL (ref 150–400)
Platelets: 296 10*3/uL (ref 150–400)
RBC: 3.97 MIL/uL (ref 3.87–5.11)
RDW: 15.1 % (ref 11.5–15.5)
RDW: 15.1 % (ref 11.5–15.5)
RDW: 15.3 % (ref 11.5–15.5)
WBC: 5.5 10*3/uL (ref 4.0–10.5)

## 2010-10-09 LAB — POCT I-STAT, CHEM 8
Creatinine, Ser: 1 mg/dL (ref 0.4–1.2)
Glucose, Bld: 176 mg/dL — ABNORMAL HIGH (ref 70–99)
Hemoglobin: 15 g/dL (ref 12.0–15.0)
Potassium: 4 mEq/L (ref 3.5–5.1)

## 2010-10-09 LAB — GLUCOSE, CAPILLARY
Glucose-Capillary: 128 mg/dL — ABNORMAL HIGH (ref 70–99)
Glucose-Capillary: 129 mg/dL — ABNORMAL HIGH (ref 70–99)
Glucose-Capillary: 140 mg/dL — ABNORMAL HIGH (ref 70–99)
Glucose-Capillary: 172 mg/dL — ABNORMAL HIGH (ref 70–99)
Glucose-Capillary: 175 mg/dL — ABNORMAL HIGH (ref 70–99)
Glucose-Capillary: 97 mg/dL (ref 70–99)

## 2010-10-09 LAB — POCT CARDIAC MARKERS
Myoglobin, poc: 32 ng/mL (ref 12–200)
Myoglobin, poc: 32.7 ng/mL (ref 12–200)
Troponin i, poc: <0.05 ng/mL (ref 0.00–0.09)

## 2010-10-09 LAB — MAGNESIUM: Magnesium: 2 mg/dL (ref 1.5–2.5)

## 2010-10-09 LAB — HEPARIN LEVEL (UNFRACTIONATED)
Heparin Unfractionated: 0.56 IU/mL (ref 0.30–0.70)
Heparin Unfractionated: 0.62 IU/mL (ref 0.30–0.70)

## 2010-10-09 LAB — CK TOTAL AND CKMB (NOT AT ARMC): Relative Index: INVALID (ref 0.0–2.5)

## 2010-10-09 LAB — DIFFERENTIAL
Basophils Absolute: 0 10*3/uL (ref 0.0–0.1)
Basophils Relative: 1 % (ref 0–1)
Eosinophils Relative: 1 % (ref 0–5)
Monocytes Absolute: 0.4 10*3/uL (ref 0.1–1.0)
Neutro Abs: 3.6 10*3/uL (ref 1.7–7.7)

## 2010-10-09 LAB — PROTIME-INR: Prothrombin Time: 12.3 seconds (ref 11.6–15.2)

## 2010-10-09 LAB — CARDIAC PANEL(CRET KIN+CKTOT+MB+TROPI): CK, MB: 2.7 ng/mL (ref 0.3–4.0)

## 2010-11-02 ENCOUNTER — Ambulatory Visit: Payer: Self-pay | Admitting: Vascular Surgery

## 2010-11-02 ENCOUNTER — Other Ambulatory Visit: Payer: Self-pay

## 2010-11-02 HISTORY — PX: COLONOSCOPY: SHX174

## 2010-11-02 HISTORY — PX: UPPER GASTROINTESTINAL ENDOSCOPY: SHX188

## 2010-11-15 ENCOUNTER — Ambulatory Visit (HOSPITAL_COMMUNITY)
Admission: RE | Admit: 2010-11-15 | Discharge: 2010-11-15 | Disposition: A | Discharge status: Home / Self Care | Payer: Medicare Other | Source: Ambulatory Visit | Attending: Gastroenterology | Admitting: Gastroenterology

## 2010-11-16 NOTE — Op Note (Signed)
[name], [name]             ACCOUNT NO.:  [id number]   MEDICAL RECORD NO.:  [id number]          PATIENT TYPE:  AMB   LOCATION:  SDS                          FACILITY:  [facility]   PHYSICIAN:  Larina Earthly, M.D.    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   PROCEDURE:  Right femoral to above-knee popliteal bypass with composite  saphenous vein graft.   SURGEON:  Larina Earthly, M.D.   ASSISTANT:  Jerold Coombe, P.A.-C   ANESTHESIA:  General endotracheal.   COMPLICATIONS:  None.   DISPOSITION:  To recovery room stable.   PROCEDURE IN DETAIL:  The patient was taken to the operating room,  placed in supine position where the area of the right leg and right  groin were prepped and draped in the usual sterile fashion.  Incision  was made over the femoral pulse and carried down to isolate the common,  superficial femoral, and profunda femoris arteries.  The patient had  extensive calcification of the common femoral artery, but it was widely  patent.  The saphenous vein was exposed at the saphenofemoral junction  and it was of excellent caliber for the first several centimeters.  Separate incisions were made for unroofing the vein.  In the proximal  thigh, the vein branched into three different branches.  All of three of  these were traced and all three of them were of very poor quality.  The  largest was tracked all the way down to the below-knee position and did  remain small.  The vein was divided and attempted to be dilated and was  unusable at this segment.  The other two branches were also traced and  the largest of these three were used and was acceptable to the level of  mid thigh, but then became  unusable.  For this reason, the vein was  harvested from the calf and again was of marginal caliber.  It was 2.5  to 3-cm in diameter.  This vein was harvested and decision was made to  splice this with the usable portion of the thigh vein and go to the  above-knee popliteal artery.  The above-knee popliteal artery was  exposed to the medial approach through the same vein harvest incision  and was of good caliber.  The above-knee popliteal artery had some  calcification, but had a widely patent caliber vessel.  A tunnel was  created from the level of the SFA to the groin.  The saphenofemoral  junction was occluded with a Cooley clamp and the vein was divided from  the saphenofemoral junction.  The saphenofemoral junction was oversewn  with a 5-0 Prolene suture.  Patient was given 8000 units of intravenous  heparin and after adequate circulation time, the common, superficial  femoral and profunda femoris arteries were occluded.  The common femoral  artery was opened  at the superficial femoral artery junction.  The  artery was opened longitudinally with Potts scissors.  The graft was  spatulated and sewn end-to-side to the artery with a running 6-0 Prolene  suture.  The anastomosis was tested and found to be adequate.  Next,  using a Mills valvulotome, the vein valves were lysed, giving flow to  this graft.  Finally, the portion of the vein that had been harvested  from the calf was brought onto the field.  The vein was spatulated  proximally and distally and was sewn end-to-end with a running 6-0  Prolene suture.  This anastomosis was tested and found to be adequate.  The spliced vein graft was then brought through the subcutaneous tunnel  down to the level of the superficial femoral artery.  The artery was  occluded proximally and distally and was opened with a #11 blade  longitudinally with Potts scissors.  The vein graft was cut to the  appropriate length.  It was spatulated and sewn  end-to-side to the  artery with a running 6-0 Prolene suture.  Clamps were removed and graft  dependent Doppler flow was noted to the foot.  The wound was irrigated  with saline.  Hemostasis was with electrocautery.  The patient was given  50 mg of protamine to reverse the heparin.  The wounds were closed with  2-0 Vicryl in several layers in the groin and popliteal incision.  The  skin was closed with a 4-0 subcuticular Vicryl stitch.  The small vein  harvest incisions were closed with 3-0 and 4-0 Vicryl sutures.  A  sterile dressing was applied and the patient was taken to the recovery  room in stable condition.      Larina Earthly, M.D.  Electronically Signed     TFE/MEDQ  D:  06/05/2007  T:  06/06/2007  Job:  [id number]

## 2010-11-16 NOTE — Assessment & Plan Note (Signed)
OFFICE VISIT   [name], [name]  DOB:  Sep 02, 1945                                       03/23/2010  ZOXWR#:[id number]   The patient returns today having been seen by Dr. Arbie Cookey on August 12.  She has a right femoral-popliteal Gore-Tex graft which is a redo ans a  left femoral-popliteal vein graft function.  Over the last several days  she has had some ulceration develop in her right first toe with some  erythema.  She has had no chills and fever or drainage.  She states that  the right calf has also become more symptomatic although it was becoming  symptomatic when she saw Dr. Arbie Cookey in early August.  At that time, the  ABI in the right leg was .042 and the left leg was  0.62.   PAST MEDICAL HISTORY:  Chronic medical problems:  1. Diabetes, non-insulin-dependent.  2. Hypertension.  3. Hyperlipidemia.  4. Negative for coronary artery disease.   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 She is  married,  has not smoked since 2003 and drinks occasional alcohol.   REVIEW OF SYSTEMS:  Negative for chest pain, dyspnea on exertion,  decreased visual acuity discomfort in the legs.  All other systems  negative.   PHYSICAL EXAMINATION:  VITAL SIGNS:  Blood pressure 142/83, heart rate  73, temperature 98.  GENERAL:  Well developed, well nourished female in no apparent distress,  alert and oriented  x3.  HEENT:  Exam is normal.  EOMs intact.  LUNGS:  Clear to auscultation.  ABDOMEN:  Soft, nontender with no masses.  MUSCULOSKELETAL:  Free of major deformities.  Lower extremity exam  reveals 2 to 3+ femoral pulses bilaterally.  Popliteal pulses are  difficult to palpate.  Both feet are adequately perfused.  Right leg has  some mild erythema with an opening about 1.5 cm in length over the first  toe with no purulent drainage.  No fluctuance.  She does have intact  motion sensation.   Today I ordered lower extremity duplex scan of her graft as well  as  ABIs.  ABI on the right leg is 0.50 which is essentially unchanged.  Her  femoral-popliteal Gore-Tex graft on the right is patent although there  is an area of high velocity in the iliac artery proximally at 296 cm per  second and also an area of high velocity distal to the graft in the  popliteal artery at 413 cm per second.   I suspect that her graft is in jeopardy from progressive disease  distally and we will schedule her for angiogram on Tuesday, September 27  by Dr. Myra Gianotti to see what our options are.  If her symptoms worsen she  will be in touch with [place].  In the interim I will start her on Keflex 500  mg t.i.d.  If she develops any drainage or fever, she will be in touch  with [place].     Quita Skye Hart [place], M.D.  Electronically Signed   JDL/MEDQ  D:  03/23/2010  T:  03/24/2010  Job:  [id number]

## 2010-11-16 NOTE — Discharge Summary (Signed)
NAMECHLORIS, MARCOUX             ACCOUNT NO.:  [id number]   MEDICAL RECORD NO.:  [id number]          PATIENT TYPE:  INP   LOCATION:  2003                         FACILITY:  [facility]   PHYSICIAN:  Larina Earthly, M.D.    DATE OF BIRTH:  Jan 18, 1946   DATE OF ADMISSION:  07/09/2007  DATE OF DISCHARGE:  07/11/2007                               DISCHARGE SUMMARY   ADMISSION DIAGNOSIS:  Left-foot ischemia with nonhealing ulceration of  the left toes.   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   SECONDARY DISCHARGE DIAGNOSES:  1. Noninsulin-dependent diabetes.  2. Hypertension.  3. Elevated cholesterol.  4. Daily alcohol consumption.  5. Right femoral popliteal bypass, June 04, 2007.   CONSULTATIONS:  Diabetes treatment program recommendation on July 10, 2007.   PROCEDURES:  Left femoral to above knee popliteal bypass with  translocated, nonreversed saphenous vein on July 09, 2007, by Dr. Gretta Began.   BRIEF HISTORY AND PHYSICAL:  This patient is a 65 year old white female,  who recently underwent right femoral and popliteal bypass with saphenous  vein on June 04, 2007.  She had nonhealing ulcerations, limited to  both toes, and prior failed SFA endovascular treatment by Dr. Geralynn Rile in March 2008.  She had sparing of both superficial arteries and  underwent subsequent arteriograph in the last month, showing occlusion.  She did well with a right femoral bypass from June 04, 2007, and is  now going to be admitted on July 09, 2007, for staged left femoral  popliteal bypass.  The impression of the left foot:  She has some  ischemia and patient will be admitted to have a left fem-pop bypass on  July 09, 2007.  Patient was admitted to [facility] to proceed with  procedure after any consultations.  Procedure had been explained to  patient and patient agreed that she understood all these risks and was  willing  to sign consent for elective surgery.  Patient was scheduled for  elective surgery on July 09, 2007.   HOSPITAL COURSE:  Patient was taken to the OR to proceed with procedure  of a left femoral to popliteal above-knee bypass graft, done by Dr.  Arbie Cookey on July 09, 2007, after signing consent to proceed with  procedure.  Patient, following procedure, remained stable and was  transferred to PACU.  Patient was then transferred from PACU to floor  2000, where patient continued to remain stable overnight.  On July 10, 2007, postoperative day #1, patient continued to remain stable and  afebrile.  Patient did receive labs that day because she said that they  always had trouble getting blood.  Patient states she is feeling great  with some mild soreness of the left extremity.  Patient wants to take  medications from home and we agreed to doing that instead of changing to  what facility had available.  Patient stated that she was ready to go  home.  After discussing this with patient, thought it would be best for  her to wait one more day  after we discontinued the PCA pump.   On physical examination, patient was alert and oriented in bed, normal  rate, regular rhythm of heart.  Lungs were clear bilaterally.  Abdomen  was soft, nontender, nondistended, active bowel sounds.  The left  extremity dressings were intact, dry, with mild drainage.  Plan was to  remove dressing the next day, postoperative day #2.  There was no edema.  The foot was warm and pink and there was Doppler flow.   Assessment and plan was to start ambulating, to consult PT if needed.  Discontinue PCA pump, start all meds, __________ Dilaudid and Demerol  were available to patient as needed for pain.  Go ahead and discontinue  the Foley once the patient was able to ambulate and plan for discharge  to home if pain controlled and able to ambulate without difficulty on  July 11, 2007.   Postoperative day #2, on July 11, 2007, the patient's vitals:  Blood  pressure was 110/60, heart rate 90, respirations 20, temperature 98.3,  and O2 was 95 on room air.  Patient stated that she had no complaints,  that she was ready to go home.  Patient stated that her pain was  controlled with all meds, Demerol.  On physical examination, patient was  alert and oriented times three.  She has normal rate, regular rhythm of  heart.  Lungs are clear bilaterally and abdomen was soft, nontender,  nondistended, active bowel sounds.  The patient's left extremity  incisions, after removing the dressings, were clean and dry with no  signs of infection, edema or redness and they looked great and healing.  Left foot is pink and warm with positive Doppler flow.  Assessment and  plan at this time:  Patient's Foley has been discontinued.  Once she  urinates she is going to be discharged to home.  Patient does have a  walker at home.  The patient and her husband both agreed with this plan  and stated that she is ready to go home as long as her vitals remain  stable and she remains afebrile and patient is able to urinate since  Foley has been discontinued.   DISCHARGE MEDICATIONS:  1. Ramipril 2.5 mg.  2. Zetia 10 mg.  3. Hydrochlorothiazide 25 mg.  4. Glyburide 5 mg.  5. Aspirin 325 mg.  6. Actos Plus.  7. Januvia 100 mg.  8. Pepcid 30 mg.  9. __________  10.Alprazolam.  11.Demerol 100 mg one tablet every 4 hours.  Patient was given Demerol      50 mg and she can take up to two tablets every 4 hours as needed      for pain.   DISCHARGE INSTRUCTIONS:  Patient is going to be discharged on July 11, 2007, if her vitals continue to remain stable and patient remains  afebrile.  Patient agreed with this plan.  I instructed the patient to  clean wounds with soap and water.  Patient is to increase activity  slowly and walk with assistance and no lifting or driving for two weeks.  Patient may shower.  Patient was also instructed to  contact our office  or ER if any redness, drainage or swelling from incisions, any fever  greater than 101, any numbness, coldness or pain to the left extremity.  Patient also was instructed that she would return to Dr. Arbie Cookey in three  weeks for a followup visit and ABIs would be scheduled.  Patient is to  continue home medications per home medication reconciliation sheet.  Patient also was  given a prescription for Demerol 50 mg and she would take up to two  tablets orally every four hours as needed for pain.  Patient stated that  she understood these instructions and stated that she agreed with these  instructions and patient was discharged on July 11, 2007.      Cyndy Freeze, PA      Larina Earthly, M.D.  Electronically Signed    ALW/MEDQ  D:  07/11/2007  T:  07/11/2007  Job:  [id number]

## 2010-11-16 NOTE — Assessment & Plan Note (Signed)
OFFICE VISIT   MILAINA, SHER  DOB:  [birth year]                                       05/09/2008  [id number]#:[id number]   The patient presents today for followup of her extensive lower extremity  peripheral vascular occlusive disease.  She has had an extensive past  with angioplasty of her iliac arteries bilaterally by Dr. Samule Ohm and  also bilateral superficial femoral revascularization and recannulization  with Dr. Samule Ohm as well.  These had early failure and she is undergoing  subsequent staged femoral to popliteal bypasses.  This is with Propaten  on the right above knee fem-pop bypass with Propaten graft on April of  2009 and her left saphenous vein graft femoral to above knee on  07/09/2007.  She is doing extremely well.  She has no claudication  symptoms.  All of the ulcerations on both feet have completely healed.  She reports that she is having re-growth of hair, some of which has not  been present in the past.  Overall, she is quite pleased with her  result.   PHYSICAL EXAMINATION:  She has 2+ femoral pulses and does have palpable  posterior tibial pulses bilaterally.  She has no open ulcerations.  Her  feet are warm and well perfused.   She underwent noninvasive vascular laboratory studies in our office  today.  This revealed a drop in her right ankle/arm index from 0.98 in  July this year to 0.67.  On the left it was 0.98 and has dropped to 75.  Scanning of her graft revealed no evidence of stenoses throughout her  grafts or at her proximal or distal anastamosis.  She did have  suggestion of possible inflow occlusive disease on the right.  She did  have a prior right iliac common iliac artery angioplasty.  I reviewed  her most recent arteriogram in December 2008, and at that time, she did  not have any evidence of stenosis.  I discussed options with the patient  and her husband present.  Since she is having no symptoms related to  this and no  evidence of graft issues, I am comfortable looking at this  again in 3 months.  I explained that she had a very slight risk of  putting her graft at risk for any potential inflow occlusive disease.  With her extremely extensive prior procedures they are quite comfortable  with observation only.  She will notify should she develop any new  clinical difficulties.  Otherwise, we will see her in 3 months for  repeat ankle/arm indices and graft scans.   Larina Earthly, M.D.  Electronically Signed   TFE/MEDQ  D:  05/09/2008  T:  05/12/2008  Job:  [id number]   cc:   Jeannett Senior A. Evlyn Kanner, M.D.  Veverly Fells. Excell Seltzer, MD

## 2010-11-16 NOTE — H&P (Signed)
HISTORY AND PHYSICAL EXAMINATION   June 22, 2007   Re:  RUHI, KOPKE               DOB:  1945-09-16   CHIEF COMPLAINT:  Left leg arterial insufficiency with nonhealing toe  ulcerations.   HISTORY OF PRESENT ILLNESS:  Patient is a 65 year old white female  recently underwent right femoral and popliteal bypass with saphenous  vein on June 04, 2007.  She had nonhealing ulcerations on the tips of  both toes and had prior failed SFA endovascular treatment by Dr. Geralynn Rile in March, 2008.  She had stenting of both superficial arteries  and underwent subsequent arteriography in the last month showing  occlusion.  She did well with her right fem-pop bypass from June 04, 2007 and is now admitted on July 09, 2007 for staged left fem-pop  bypass.   PAST MEDICAL HISTORY:  1. Non-insulin-dependent diabetes.  2. Hypertension.  3. Elevated cholesterol.   She does not have any history of prior myocardial infarction or  congestive heart failure.  She is status post carotid endarterectomy.   FAMILY HISTORY:  Significant for premature atherosclerotic disease in  mother, father, and brother.   SOCIAL HISTORY:  She is married, a homemaker, quit smoking in 2003.  She  does have daily alcohol consumption.   REVIEW OF SYSTEMS:  Negative for weight gain or weight loss.  GI:  Positive for reflux.  PULMONARY:  No difficulties.  GU:  No  difficulties.   MEDICATION ALLERGIES:  DARVON, CODEINE, and MYCIN DRUGS.   MEDICATIONS:  1. Altace 2.5 mg daily.  2. Hydrochlorothiazide 25 mg daily.  3. Zetia 10 mg daily.  4. Glyburide 5 mg daily.  5. Alprazolam 0.25 mg daily.  6. Aspirin 325 mg daily.  7. Actoplus/Metformin 15/500 daily.  8. Januvia 100 mg daily.  9. Prevacid 30 mg daily.   PHYSICAL EXAMINATION:  General:  A well-developed and well-nourished  white female appearing her stated age of 66.  Blood pressure 151/92,  pulse 100, respirations 18.  Carotids  are without bruits bilaterally.  She does have a well-healed left carotid incision.  Her radial pulses  are 2+.  She has 2+ femoral pulses.  She does have well-healing right  leg fem-pop bypass incisions.  She has ulcerations over the clips of  both toes on both feet.   IMPRESSION:  Left foot ischemia, status post right femoral-popliteal  bypass.   PLAN:  Patient will be admitted on January 5 for left fem-pop bypass.   Larina Earthly, M.D.  Electronically Signed   TFE/MEDQ  D:  06/22/2007  T:  06/25/2007  Job:  [id number]

## 2010-11-16 NOTE — Discharge Summary (Signed)
NAMEKELLEY, POLINSKY             ACCOUNT NO.:  [id number]   MEDICAL RECORD NO.:  [id number]          PATIENT TYPE:  INP   LOCATION:  2504                         FACILITY:  [facility]   PHYSICIAN:  Veverly Fells. Excell Seltzer, MD  DATE OF BIRTH:  August 18, 1945   DATE OF ADMISSION:  02/25/2009  DATE OF DISCHARGE:  02/27/2009                               DISCHARGE SUMMARY   PRIMARY CARDIOLOGIST:  Veverly Fells. Excell Seltzer, MD   DISCHARGE DIAGNOSIS:  Non-ST-segment elevation myocardial infarction.   SECONDARY DIAGNOSES:  1. Coronary artery disease, status post successful percutaneous      coronary intervention and stenting of the left circumflex with      Xience V drug-eluting stent, this admission.  2. Peripheral vascular disease, status post left-sided carotid      endarterectomy as well as history of fem-pop bypass.  3. Hypertension.  4. Hyperlipidemia.  5. History of congestive heart failure.  6. Remote tobacco abuse.  7. Raynaud phenomenon.  8. Diabetes mellitus.   ALLERGIES:  1. STATIN cause myalgias.  She is allergic to:  1. CODEINE.  2. DIOVAN.  3. ERYTHROMYCIN.   PROCEDURES:  Left heart rate catheterization on February 26, 2009,  revealing a 90% stenosis in the proximal to mid-left circumflex along  with significant diffuse small vessel branch and the distal right  coronary artery disease.  Otherwise, nonobstructive disease.  Ejection  fraction was approximately 50% with mild inferior hypokinesis.   PROCEDURE:  Successful percutaneous coronary intervention and stenting  of the left circumflex placement of a 3.0- x 23-mm Xience V drug-eluting  stent.   HISTORY OF PRESENT ILLNESS:  A 65 year old Caucasian female with  significant history of peripheral vascular disease who was in her usual  state of health until 12:15 a.m. on February 25, 2009, when she awoke with  7/10 chest pain associated with diaphoresis.  EMS was activated and she  was given nitroglycerin with some relief.  She was taken  to the [facility].  There, she has slight T-wave flattening in D5 and T-wave  inversion in V6.  Her first set of cardiac markers were negative.  She  was admitted for further evaluation.   HOSPITAL COURSE:  The patient ruled in for non-ST-elevation MI with a  troponin of 0.22.  Her CKs and MBs were negative.  She underwent left  heart rate catheterization on February 26, 2009, revealing significant  left circumflex disease as outlined above.  This was successfully  stented with 3.0- x 23-mm Xience V drug-eluting stent.  She otherwise  had significant branch vessel and distal RCA disease as well as  nonobstructive disease in her left coronary tree.  EF was 50% with mild  inferior hypokinesis.  Postprocedure, the patient has not had any  recurrent chest discomfort.  She has been ambulating without symptoms or  limitations.  Plan to discharge her home today in good condition.   DISCHARGE LABORATORIES:  Hemoglobin 11.9, hematocrit 34.7, WBC 5.6, and  platelets 278.  Sodium 142, potassium 3.4, chloride 110, CO2 of 24, BUN  6, creatinine 0.64, glucose 142, and calcium 8.7.  CK  52 and MB 2.7.  Troponin I 0.22.   DISPOSITION:  The patient will be discharged home today in good  condition.   FOLLOWUP PLANS AND APPOINTMENTS:  We have arranged for followup with Dr.  Earmon Phoenix nurse practitioner on March 12, 2009, at 8:45 a.m.   DISCHARGE MEDICATIONS:  1. Plavix 75 mg daily.  2. Nitroglycerin 0.4 mg sublingual p.r.n. chest pain.  3. Pepcid 20 mg b.i.d.  4. Toprol-XL 25 mg daily.  5. Alprazolam 0.25 mg daily.  6. Altace 2.5 mg daily.  7. Enteric-coated aspirin 325 mg daily.  8. Glyburide 5 mg daily.  9. Hydrochlorothiazide 25 mg daily.  10.Januvia 100 mg daily.  11.Lovaza 1 g 2 caps b.i.d.  12.MiraLax 17 g daily.  13.Over-the-counter steroid cream for her feet as previously used.  14.ActoPlus Met 15/500 mg daily.   OUTSTANDING LAB STUDIES:  None.   DURATION OF DISCHARGE ENCOUNTER:   60 minutes including physician time.      Nicolasa Ducking, ANP      Veverly Fells. Excell Seltzer, MD  Electronically Signed    CB/MEDQ  D:  02/27/2009  T:  02/28/2009  Job:  [id number]

## 2010-11-16 NOTE — H&P (Signed)
HISTORY AND PHYSICAL EXAMINATION   May 30, 2007   Re:  [name], [name]               DOB:  1946/06/29   CHIEF COMPLAINT:  Bilateral lower extremity arterial insufficiency with  bilateral tissue loss.   HISTORY OF PRESENT ILLNESS:  The patient is a 65 year old white female  with nonhealing ulcerations on both toes of both feet.  She has an  extensive history of prior peripheral vascular occlusive disease and  treatment.  She is known to me from a prior left carotid end-arterectomy  in 09/2003.  She has not had difficulty since that time.  She underwent  extensive bilateral lower extremity endovascular treatment for SFA  disease by Dr. Geralynn Rile in 09/2006.  She had stenting of both  superficial femoral arteries, and also stenting of her right common  iliac artery.  She suffered early recurrence of disease, and continues  to have ulcerations over her left great toe and right great, and 3rd  toe.  She does have significant pain associated with this, and also has  left calf claudication.  She is seeing me for further discussion.   PAST MEDICAL HISTORY:  Diabetes, non-insulin-dependent.  She is  hypertensive.  Has elevated cholesterol.  Does not have any history of  prior myocardial infarction.  Does have history of congestive failure.   FAMILY HISTORY:  Significant for premature atherosclerotic disease in  mother, father and brothers.   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  She quit  smoking in 2003.  Does have daily alcohol consumption.   REVIEW OF SYSTEMS:  Negative for weight gain or weight loss.  She does  have reflux.  PULMONARY:  No medical problems.  GU:  No medical problems.   MEDICATION ALLERGIES:  Darvon, codeine and mycins.   MEDICATIONS:  1. Altace 2.5 mg daily.  2. Hydrochlorothiazide 25 mg daily.  3. Zetia 10 mg daily.  4. Glyburide 5 mg daily.  5. Alprazolam 0.25 mg daily.  6. Aspirin 325 mg daily.  7.  Actoplus/metformin 15/500 daily.  8. Januvia 100 mg daily.  9. Prevacid 30 mg daily.   PHYSICAL EXAMINATION:  Well-developed, well-nourished white female  appearing stated age, 74.  Blood pressure 143/80, pulse 94, respirations  18.  Her carotid reveals left well-healed incision.  No bruits bilaterally.  She has 2+ radial pulses and 2+ femoral pulses.  She has absent  popliteal distal pulses.  Chest is clear bilaterally.  Heart regular  rate and rhythm.  Abdomen is benign with no masses.  Her lower  extremities were noted for full-thickness ulceration over the tip of her  right and left great toe, more significant on the right than on the  left.  She does have ulceration over the right 3rd toe as well.   I had a long discussion with Ms. Goffredo and her husband present.  She  currently is having intolerable pain and aggressive tissue loss.  She  will undergo arteriography as an outpatient on December 1, with plans  for a right femoral to popliteal bypass on 12/02.  She understands the  procedure including saphenous vein harvest, the incisions involved,  expected postoperative swelling, and also possible failure of the bypass  with expected 5-year patency of 70%.  She will then undergo staged left  fem-pop bypass after recovery from her right bypass on 12/02.   Larina Earthly, M.D.  Electronically Signed   TFE/MEDQ  D:  05/30/2007  T:  06/04/2007  Job:  [id number]

## 2010-11-16 NOTE — Op Note (Signed)
[name], [name]             ACCOUNT NO.:  [id number]   MEDICAL RECORD NO.:  [id number]          PATIENT TYPE:  INP   LOCATION:  3312                         FACILITY:  [facility]   PHYSICIAN:  Larina Earthly, M.D.    DATE OF BIRTH:  [birth year]   DATE OF PROCEDURE:  10/30/2007  DATE OF DISCHARGE:                               OPERATIVE REPORT   PREOPERATIVE DIAGNOSIS:  Right foot ischemia with nonhealing toe  ulceration.   POSTOPERATIVE DIAGNOSIS:  Right foot ischemia with nonhealing toe  ulceration.   PROCEDURE:  Redo right femoral-to-above-knee popliteal bypass with  Propaten Gore-Tex graft, 6-mm ringed.   SURGEON:  Larina Earthly, M.D.   ASSISTANT:  Nurse.   ANESTHESIA:  General endotracheal.   COMPLICATIONS:  None.   DISPOSITION:  To recovery room in stable.   PROCEDURE IN DETAIL:  The patient was taken to the operating room and  placed supine position where the right groin and right leg were prepped  and draped in usual sterile fashion.  Incision was made through the  prior groin incision, carried down to isolate the common superficial  femoral and profunda femoris arteries.  The superficial femoral artery  was chronically occluded.  The patient had 2 large fundal branches and  these were controlled with vessel loops.  The common carotid artery had  a dense posterior plaque and the artery was encircled at the level of  the inguinal ligament and controlled with vessel loops.  The patient had  a prior vein fem-pop and the vein was actually patent with pulse in the  proximal anastomosis.  Next, a separate incision was made over the prior  medial incision at the above-knee popliteal approach, carried down to  isolate the popliteal artery and the old venous anastomosis.  The vein  was very sclerotic at this area.  The artery was controlled proximal and  further distally.  The artery was patent, but it again had diffuse  calcification and disease.  The tunnel was created  from level of the  above-knee popliteal artery to the groin.  The patient was given 7000  units of intravenous heparin.  After adequate circulation time, the  common superficial femoral and profunda femoris arteries were occluded  with vessel loops.  The old vein was excised.  The artery was spatulated  further proximally and a 3-1/2 dilator passed with mild resistance due  to the plaque in the common femoral artery.  The superior femoral artery  was chronically occluded and was ligated and divided.  A 6-mm Propaten  Gore-Tex graft with rings was brought onto the field, it was spatulated,  and sewn into the side to the junction of the common femoral and  profunda femoris artery.  This was a running 6-0 Prolene suture.  After  the usual flushing maneuvers, the anastomosis was completed and the  graft was clamped and flow was restored to the profundus femoris artery.  The graft was then tunneled down to the level of the above-knee  popliteal artery.  The above-knee popliteal artery was occluded  proximally and distally.  The old venous anastomosis  was ligated and the  artery was opened just distal to this.  The arteriotomy was created with  Potts scissors.  A 3-dilator passed through the popliteal artery.  The  graft was cut to the appropriate length, was spatulated and was sewn  into side to the above-knee popliteal artery with a running 6-0 Prolene  suture.  After the usual flush maneuvers, anastomosis was completed.  Intraoperative arteriogram was obtained with a 21-gauge butterfly needle  through the graft near the groin.  This showed technically good distal  anastomosis.  The patient had extensive disease in the runoff with a  runoff primarily through the peroneal artery.  The patient did have  graft-dependent Doppler signal in the foot.  The patient was given 100  mg of protamine to reverse the heparin.  The wound was irrigated with  saline.  Hemostasis was achieved with  electrocautery.  Wounds were  closed with 2-0 Vicryl in the subcutaneous tissue, and the skin was  closed with a 3-0 subcuticular Vicryl stitch.  Sterile dressing was  applied and the patient was taken to the recovery room in stable  condition.      Larina Earthly, M.D.  Electronically Signed     TFE/MEDQ  D:  10/30/2007  T:  10/31/2007  Job:  [id number]

## 2010-11-16 NOTE — Procedures (Signed)
CAROTID DUPLEX EXAM   INDICATION:  Left carotid endarterectomy.   HISTORY:  Diabetes:  Yes.  Cardiac:  No.  Hypertension:  Yes.  Smoking:  Previous.  Previous Surgery:  Left carotid endarterectomy on 09/02/2003.  CV History:  Asymptomatic.  Amaurosis Fugax No, Paresthesias No, Hemiparesis No                                       RIGHT             LEFT  Brachial systolic pressure:         139               125  Brachial Doppler waveforms:         Normal            Normal  Vertebral direction of flow:        Antegrade         Not visualized  DUPLEX VELOCITIES (cm/sec)  CCA peak systolic                   139               134  ECA peak systolic                   386               218  ICA peak systolic                   211               107  ICA end diastolic                   52                33  PLAQUE MORPHOLOGY:                  Mixed             Mixed  PLAQUE AMOUNT:                      Moderate / severe Mild  PLAQUE LOCATION:                    ICA/ECA/CCA       ECA/CCA   IMPRESSION:  1. 60%-79% stenosis of the right internal carotid artery.  2. Patent left carotid endarterectomy site with no evidence of left      internal carotid artery stenosis.  3. Increased velocities of the right internal carotid artery noted      when compared to the previous exam on 04/01/2004 with the left      internal carotid artery remaining stable.   ___________________________________________  Larina Earthly, M.D.   CH/MEDQ  D:  04/10/2009  T:  04/11/2009  Job:  [id number]

## 2010-11-16 NOTE — Consult Note (Signed)
NEW PATIENT CONSULTATION   [name], [name]  DOB:  10/04/1945                                       04/25/2007  [id number]#:[id number]   Patient presents today for discussion of her bilateral lower extremity  arterial insufficiency.  She is well known to me from a prior left  carotid endarterectomy and Dacron patch angioplasty in March, 2005.  I  have not seen her since 2005.  She had developed progressive lower  extremity arterial insufficiency and was treated by Dr. Geralynn Rile of  [facility] in the spring of 2008.  She had nonhealing  ulcerations of her right toes and underwent aortogram revealing diffuse  disease bilaterally.  In March, 2008, she underwent stenting of her  right superficial femoral artery, and in April, 2008, she underwent  stenting of her left superficial femoral artery and also of her right  common iliac artery.  She now has had recurrence of disease.  She has  ulcerations on her left great toe and on her right first, third, and  fifth toes.  She does have mild pain.  This is not intolerable to her.   Past medical history is significant for diabetes.  She is not on  insulin.  She has hypertension, elevated cholesterol.  Does not have any  prior history of heart attack but does have a history of congestive  failure in the past.   FAMILY HISTORY:  Significant for premature atherosclerotic disease in  her mother, father, and brothers.   SOCIAL HISTORY:  She is married.  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  She does not smoke, having quit in 2003.  Does have 4 ounces of alcohol  per day.   REVIEW OF SYSTEMS:  No weight loss or weight gain.  GI:  Significant for reflux.  No pulmonary issues.  No GU issues.  VASCULAR:  With pain and nonhealing ulcers.  NEURO:  No difficulties.   MEDICATION ALLERGIES:  DARVON, CODEINE, MYCINS.   MEDICATIONS:  1. Altace 2.5 mg daily.  2. Hydrochlorothiazide 25 mg daily.  3. Zetia 10 mg daily.  4.  Glyburide 5 mg daily.  5. Alprazolam 0.25 mg daily.  6. Aspirin 325 mg daily.  7. Actoplus Met 15/500 daily.  8. Jenuvia 100 mg daily.  9. Prevacid 30 mg daily.   PHYSICAL EXAMINATION:  A well-developed and well-nourished white female  appearing her stated age of 75.  Her blood pressure is 122/80.  Pulse  64.  Respirations 16.  Her left neck incision is well healed.  She is  grossly intact neurologically.  She has 2+ radial pulses and 2+ femoral  pulses.  She has absent popliteal and distal pulses.  I have a copy of a  recent noninvasive vascular laboratory study done at [facility]  showing ankle-arm index of 2.5 to 0.29 bilaterally.   I had a long discussion with patient and her husband present.  They are  quite upset about the fact that she had this recent treatment in March  and April with stenting of her superficial femoral arteries and has had  an early recurrence.  They do not have insurance and apparently had a  $60,000 bill with this procedure.  I have told them that I was not going  to dwell on what was done in March and April of this year.  I did  explain to them that there is a very high recurrence rate of stenting of  the superficial femoral artery, and this is an expected common outcome.  I explained to them the indications for intervention are progressive  pain or tissue loss.  She currently is not having significant pain and  does have stable tissue loss on both feet.  I explained that her next  option would be arteriography to determine options.  She in all  likelihood would be a candidate for fem-pop bypass, hopefully above-  knee.  I explained that this would require staged procedures in both  lower extremities.  I also explained certain significant failure rate  with fem-pop bypasses at approximately 5% per year.  With the  approximately 5-year primary patency for fem-pop at 70%.  I explained  that with two bypasses, she would have potential failure on either  side.  I also explained that bypass in large part, variability would be due to  the quality of her saphenous vein at the time of bypass.   Both patient and her husband have a great deal of anger regarding this  issue and also understandable financial concerns.  I explained to them  that they would have to talk to [facility] for an estimate of  what staged femoral-to-popliteal bypass graft with associated  hospitalization would cost.  I provided to them information of what our  charge for vein fem-pop bypasses would be.  I again reinforced that the  indications for procedure would be progressive pain or tissue loss.  They had questions regarding whether this was urgent to do at this  point, and I stated as long as her ulcerations are stable and she is not  having any intolerable pain, it is safe to continue to treat these  conservatively.  They will consider their options and notify us if they  wish to proceed with arteriography.   Larina Earthly, M.D.  Electronically Signed   TFE/MEDQ  D:  04/25/2007  T:  04/26/2007  Job:  [id number]   cc:   Veverly Fells. Excell Seltzer, MD  [name]. [name], MD,FACP,FCCP  Theresia Majors Tanda Rockers, M.D.  Maxwell Caul, M.D.  Dr. Rema Fendt

## 2010-11-16 NOTE — Assessment & Plan Note (Signed)
OFFICE VISIT   [name], [name]  DOB:  06/03/1946                                       07/06/2010  [id number]#:[id number]   The patient presents today for discussion of her upcoming surgery on  07/12/2010.  She is here today with her husband.  I again reviewed her  arteriogram from September of 2011.  She does report continued  claudication in her left calf.  No claudication in her right leg.  I  again explained the procedure for the eccentric plaque in her common  femoral artery above the fem-pop bypass and expected 1 day  hospitalization.  I did explain potential risk of infection which would  be quite unlikely.  She understands and wishes to proceed with surgery  as scheduled on 07/12/2010.     Larina Earthly, M.D.  Electronically Signed   TFE/MEDQ  D:  07/06/2010  T:  07/06/2010  Job:  [id number]

## 2010-11-16 NOTE — H&P (Signed)
[name], [name]             ACCOUNT NO.:  [id number]   MEDICAL RECORD NO.:  [id number]          PATIENT TYPE:  EMS   LOCATION:  [facility]                         FACILITY:  [facility]   PHYSICIAN:  Glennie Isle, MD   DATE OF BIRTH:  05-22-1946   DATE OF ADMISSION:  02/25/2009  DATE OF DISCHARGE:                              HISTORY & PHYSICAL   PRIMARY CARDIOLOGIST:  Dr. Excell Seltzer.   CHIEF COMPLAINT:  Chest pain.   HISTORY OF PRESENT ILLNESS:  The patient is a 65 year old with a history  of extensive peripheral vascular disease, diabetes, history of tobacco,  hyperlipidemia, hypertension who currently presents with chest pain.  The patient states that she woke up at 12:15 with chest pain that was  7/10 associated with diaphoresis.  She has also noted intermittent chest  pain for the past 2-3 weeks and treated with aspirin.  She states that  her pain is somewhat relieved with nitroglycerin given by EMS.  The  patient states that the pain could be at rest or with activity.  She  does not check her blood pressure at home.  She endorses pain that lasts  about 5-10 minutes, and relieved with relaxation.  The pain has  increased with frequency and severity, and the patient very concerned  and decided to come in today.   The patient is very worried about the hospital costs as well given that  she did not have insurance.   PAST MEDICAL HISTORY:  1. Diabetes.  2. Peripheral vascular disease status post carotid endarterectomy on      the left side as well as bilateral claudication as well status post      fem-pop bypass.  The patient's most recent ABIs were 0.67 on the      right and 0.65 the left.  3. History of tobacco use.  4. History of congestive heart failure.  5. Hypertension.  6. Hyperlipidemia.  7. Coronary artery disease.  8. Raynaud's phenomenon.   CURRENT MEDICATIONS:  1. Actoplus Met 15/500 mg p.o. daily.  2. Alprazolam 0.25 mg p.o. daily.  3. Altace 2.5 mg p.o.  daily.  4. Aspirin enteric-coated 325 mg p.o. daily.  5. Glyburide 500 mg p.o. daily.  6. Hydrochlorothiazide 25 mg p.o. daily.  7. Januvia 800 mg p.o. daily.  8. Prevacid 30 mg p.o. daily.  9. Lovaza 2 pills p.o. b.i.d.   SOCIAL HISTORY:  Again, the patient has history of tobacco use, two  packs per day.  She denies any IV drug use.  Occasional alcohol.   FAMILY HISTORY:  Father died of MI at age 88.  Brother at age 59 of  coronary artery disease.   ALLERGIES:  QUESTIONABLE BETA-BLOCKER ADVERSE REACTION.  STATINS CAUSE  MYALGIAS.  THE PATIENT IS ALSO ALLERGIC TO CODEINE, DARVON AND  ERYTHROMYCIN.   REVIEW OF SYSTEMS:  Thorough 14-point review of systems was performed  and was negative except as noted per HPI.  Again, the patient endorses  chest pain, shortness of breath, claudication, diaphoresis.   PHYSICAL EXAMINATION:  VITAL SIGNS:  Pulse 92, respirations 18, blood  pressure 150/82.  GENERAL APPEARANCE:  The patient looks anxious.  HEENT:  Normocephalic, atraumatic.  Pupils equal, round, reactive to  light.  Extraocular was intact.  Oropharynx is clear.  NECK:  Supple.  No thyromegaly.  There is a right carotid bruit and a  left carotid bruit appreciated.  CARDIOVASCULAR:  S1, S2 are normal.  The patient is slightly  tachycardiac.  There is a systolic ejection murmur 2/6 at the right  upper sternal border.  SKIN:  No rash.  ABDOMEN:  Soft, nontender.  EXTREMITIES:  No clubbing, cyanosis or edema is noted.  The patient has  1+ dorsalis pedis and posterior tibial pulses bilaterally.  MUSCULOSKELETAL:  No joint deformities.  NEUROLOGIC:  Alert and oriented x3.  Cranial nerves II-XII are intact.  Strength is 5/5 upper and lower extremities bilaterally.   STUDIES:  EKG in the field showed T-wave inversions in one and AVL.  EKG  here, in the ER, the patient has slight T-wave flattening in V5 and T-  wave version in V6.  There is trivial 0.5 mm ST depression in AVL.    LABORATORY DATA:  CK-MB and troponin are negative.  BUN and creatinine  of 19 and 1.0, hemoglobin 13.9.   ASSESSMENT/PLAN:  Unstable angina.  The patient has multiple risk  factors, and given the fact that the patient had a left heart cath in  July 2003 showing some coronary disease, it is likely the patient does  have obstructive coronary artery disease now.  Given her collateral  presentation, will start heparin.  I will continue her aspirin.  I will  also start low dose metoprolol as the patient does state that she had an  adverse reaction in the past.  We will challenge her with a low dose at  this time.  Will hold off on statin as she develops severe myalgias.  We  will discuss catheterization with Dr. Excell Seltzer.  Will hold off on  metformin and the patient is n.p.o.  We will use sliding scale insulin  for diabetes for now.      Glennie Isle, MD  Electronically Signed     SS/MEDQ  D:  02/25/2009  T:  02/25/2009  Job:  [id number]

## 2010-11-16 NOTE — Procedures (Signed)
BYPASS GRAFT EVALUATION   INDICATION:  Bilateral lower extremity bypass grafts.   HISTORY:  Diabetes:  Yes.  Cardiac:  No.  Hypertension:  Yes.  Smoking:  Previous.  Previous Surgery:  Left fem-pop bypass graft on 07/09/2007, right fem-  pop bypass graft on 10/30/2007.   SINGLE LEVEL ARTERIAL EXAM                               RIGHT              LEFT  Brachial:                    139                125  Anterior tibial:             119                124  Posterior tibial:            112                139  Peroneal:  Ankle/brachial index:        0.86               1.0   PREVIOUS ABI:  Date:  08/15/2008  RIGHT:  0.67  LEFT:  0.65   LOWER EXTREMITY BYPASS GRAFT DUPLEX EXAM:   DUPLEX:  Biphasic Doppler waveforms noted throughout the bilateral lower  extremity bypass grafts and their native vessels with increased  velocities of greater than 200 cm/s noted near the right proximal  anastomosis.   IMPRESSION:  1. Patent bilateral femoral-popliteal bypass grafts with increased      velocities noted, as described above.  2. Stable bilateral ankle brachial indices noted when compared to the      previous exams.         ___________________________________________  Larina Earthly, M.D.   CH/MEDQ  D:  04/10/2009  T:  04/11/2009  Job:  [id number]

## 2010-11-16 NOTE — Procedures (Signed)
CAROTID DUPLEX EXAM   INDICATION:  Follow-up known carotid artery disease.   HISTORY:  Diabetes:  Yes  Cardiac:  No  Hypertension:  Yes  Smoking:  Previous  Previous Surgery:  Left carotid endarterectomy on September 02, 2003  CV History:  Asymptomatic  Amaurosis Fugax No, Paresthesias No, Hemiparesis No                                       RIGHT             LEFT  Brachial systolic pressure:         147               130  Brachial Doppler waveforms:         Triphasic         Triphasic  Vertebral direction of flow:        Antegrade         Not visualized  DUPLEX VELOCITIES (cm/sec)  CCA peak systolic                   139               97  ECA peak systolic                   274               184  ICA peak systolic                   211               120  ICA end diastolic                   62                33  PLAQUE MORPHOLOGY:                  Heterogeneous  PLAQUE AMOUNT:                      Moderate  PLAQUE LOCATION:                    ICA/ECA   IMPRESSION:  1. Right internal carotid artery suggesting 60%-79% stenosis.  2. Patent left carotid endarterectomy site with no evidence of      restenosis.  3. Left vertebral not visualized.   ___________________________________________  Larina Earthly, M.D.   CB/MEDQ  D:  02/16/2010  T:  02/16/2010  Job:  [id number]

## 2010-11-16 NOTE — Assessment & Plan Note (Signed)
OFFICE VISIT   ANGENI, CHAUDHURI  DOB:  July 07, 1945                                       06/22/2007  [id number]#:[id number]   The patient presents today for followup of her right fem-pop bypass.  Her incisions are all healing quite nicely.  She does have a well-  perfused foot and has stable gangrenous changes over the tips of her  toes on her right foot.  Her ankle/arm index is improved to 0.85 up from  0.29 preoperatively.  She has the usual amount of peri-incisional  numbness and mild swelling.  I am quite pleased with her initial  results, and she will continue her activity.  She does have severe  ischemia on her left foot with non-healing ulceration on her toes.  She  wishes to proceed with left fem-pop bypass, and we will schedule this  for July 09, 2007.  We underwent imaging of her veins and does have a  better caliber saphenous vein on the left compared to what was found  operatively on the right.   Larina Earthly, M.D.  Electronically Signed   TFE/MEDQ  D:  06/22/2007  T:  06/25/2007  Job:  [id number]   cc:   Veverly Fells. Excell Seltzer, MD

## 2010-11-16 NOTE — H&P (Signed)
HISTORY AND PHYSICAL EXAMINATION   October 26, 2007   Re:  [name], [name]               DOB:  Sep 23, 1945   CHIEF COMPLAINT:  Nonhealing toe ulcerations on right great toe with  arterial insufficiency.   HISTORY OF PRESENT ILLNESS:  The patient is a 65 year old white female  with extensive past peripheral vascular history.  She has had extensive  bilateral superficial artery angioplasty and stenting bilaterally with  Dr. Geralynn Rile.  Did have early failure and she subsequently underwent  studies, right fem-pop bypass on 07-04-07 and left fem-pop bypass on  07/09/2007.  At the time of her right leg bypass, she had a very  marginal vein and actually had slicing of her greater saphenous vein  from 2 segments that allowed her to have an above knee fem-pop bypass.  I saw her on 10/05/2007 with occlusion of her right fem-pop bypass.  At  that time, she was having some discomfort and she was not having any  difficulty with her right foot.  Her ankle/arm index had dropped to 0.3,  and we discussed that, in all likelihood, she would require revision.  She presents today on 04/24 and has had worsening of her ulcerations on  her right great toe.  Her left foot continues to remain stable with  healing of a very small punctate area on the tip of her left great toe.  She does have significant dependent rubor.  She is admitted on 04/28 for  a prosthetic GORE-TEX fem-pop bypass.   PAST MEDICAL HISTORY:  Non-insulin dependent diabetes.  Hypertension.  Elevated cholesterol.  She does not have history of myocardial  infarction, congestive heart failure.   SOCIAL HISTORY:  Married.  Quit smoking in 2003.  Does have daily  alcohol consumption.   FAMILY HISTORY:  Significant for premature atherosclerotic disease in  her mother, father, and brother.   REVIEW OF SYSTEMS:  Negative for weight gain or weight loss.  She does have reflux.  No pulmonary, GI, or GU difficulties.   MEDICATION ALLERGIES:  Darvon, codeine, and Mycin drugs.   MEDICATIONS:  Ramipril 2.5 mg daily.  Zetia 10 mg daily.  Hydrochlorothiazide 25 mg daily.  Glyburide 5 mg daily.  Aspirin 325 mg  daily.  Actoplus Met 15/500 one daily.  Januvia 100 mg daily.  Prevacid  30 mg daily.   PHYSICAL EXAM:  Well-developed, well-nourished white female appearing  stated age of 59.  Blood pressure is 96/66, pulse 100, respirations 16.  Her radial pulses are 2+.  She has 2+ femoral pulses.  I do not palpate  pedal pulses bilaterally.  Her heart is regular rate and rhythm.  Chest  is clear bilaterally.  Abdominal exam reveals no tenderness and no  masses.  Her right foot is noted for dependent rubor.  She does have an  ulcer on the tip of her great toe on the right and also on the plantar  aspect with poor healing and progression in size.   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   PLAN:  The patient will be admitted for prosthetic femoral to above knee  popliteal bypass.  I again discussed the long-term durability and  expectations of prosthetic graft.  I explained that the above knee  position is comparable to vein.  She has a very disadvantaged vein,  which I suspect is the cause of failure for her initial  right fem-pop,  and hopefully will add durability with prosthetic graft.  She will be  admitted on 04/28 for admission day surgery.   Larina Earthly, M.D.  Electronically Signed   TFE/MEDQ  D:  10/26/2007  T:  10/29/2007  Job:  [id number]

## 2010-11-16 NOTE — Discharge Summary (Signed)
[name], [name]             ACCOUNT NO.:  [id number]   MEDICAL RECORD NO.:  [id number]          PATIENT TYPE:  INP   LOCATION:  2023                         FACILITY:  [facility]   PHYSICIAN:  Larina Earthly, M.D.    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   DISCHARGE/SECONDARY DIAGNOSES:  1. 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  2. Diabetes mellitus type 2.  3. Hypertension.  4. Hypercholesterolemia.  5. History of congestive heart failure.  6. History of ECCVOD status post left carotid endarterectomy in 2004.  7. History of total hysterectomy.  8. History of benign cyst removal from right breast.  9. History of gastroesophageal reflux disease.  10.History of nephrolithiasis.  11.History of stenting of the left superficial femoral artery and      right common iliac artery in April 2008 by Dr. Randa Evens.  12.Stenting of two lesions of the right superficial femoral artery in      March 2008 by Dr. Samule Ohm.  13.History of tonsillectomy.  14.ALLERGY TO ERYTHROMYCIN, CODEINE AND DARVON.   PROCEDURES:  On June 05, 2007 right femoral to above-knee popliteal  artery bypass with positive saphenous vein graft in the right lower  extremity by Dr. Gretta Began.   BRIEF HISTORY:  [name] is a 65 year old Caucasian female with  nonhealing ulcerations in both of her feet.  She has an extensive  history of prior peripheral vascular occlusive disease and treatment.  She is status post left carotid endarterectomy in March 2005 by Dr. Tawanna Cooler  Early and has no difficulty from that aspect.  She underwent extensive  bilateral lower extremity  endovascular treatment for SFA disease by Dr.  Randa Evens in March and April of 2008 as described above.  Unfortunately, she has since suffered recurrence of her disease and  continued to have ulcerations over her left great toe and right great  and third toes.  She reports significant pain associated with this and  was reported left calf claudication.  She was referred to Dr. Tawanna Cooler Early  for further evaluation and treatment.  She had ABIs done on the April 18, 2007 which showed 0.29 on the right and 0.26 on the left.  Dr. Arbie Cookey  recommended that she undergo an arteriogram which was done June 04, 2007 which showed bilateral superficial femoral artery occlusion,  previous stented areas occluded and two-vessel runoff bilaterally.  Subsequently, he recommended that she undergo right femoral popliteal  artery bypass grafting.  Would anticipate that she will also eventually  need the contralateral leg done as well.   HOSPITAL COURSE:  [name] was electively admitted to [facility] June 05, 2007.  She underwent the previously mentioned  procedure.  Postoperatively, she was extubated neurologically intact in  the short-stay recovery unit.  After stay in recovery unit was  transferred to the to telemetry, unit 2000, where she remained until  discharge.  She had a relatively uneventful postoperative course.  However, she did have some emesis initially which subsided soon after.  She also was slow to mobilize initially secondary to pain.  She required  Dilaudid PCA initially, but by postoperative day #3, this was  discontinued and pain control started with Demerol p.o.  She was seen by  physical therapy and felt she had no acute needs other than a need for a  rolling walker.  The patient reported that she felt ability had improved  significantly since initial postoperative period.  Her postoperative  ABIs were 0.65 on the right and 0.26 on the left.  By June 08, 2007,   she was felt appropriate for discharge.  Again, she felt she was  mobilizing better.  Her vitals were stable.  She is afebrile.  She was  weaned from supplemental oxygen.  Her blood glucose levels had fair  control on her home regimen and NovoLog insulin sliding scale and have  ranged between 139 and 198 over the last 24 hours.  She will continue  routine monitoring at home and routine follow-up with Dr. Adrian Prince.  She has been able to void following Foley catheter removal, and bowel  function has returned as well.  Incisions have been healing well without  signs of infection.  She subsequently felt appropriate for discharge  home on postoperative day #3, June 08, 2007.  Currently, she remains  in stable condition.   LABORATORY DATA:  Her most recent labs show sodium 135, potassium 4.7,  chloride 99, CO2 27, blood glucose 139, BUN 7, creatinine 0.78, calcium  8.4.  White count 5.9, hemoglobin 11.9, hematocrit 35.1, platelet count  289.  The liver function tests were normal preoperatively.   DISCHARGE MEDICATIONS:  1. Altace 2.5 mg daily.  2. Hydrochlorothiazide 25 mg daily (she has run out of this medication      but is going to follow up with Dr. Evlyn Kanner next week to determine if      she is supposed to remain on this).  3. Zetia 10 mg daily.  4. Glyburide 5 mg daily.  5. Alprazolam 0.25 mg daily.  6. Aspirin 325 mg daily.  7. Actos plus metformin 15/500 mg daily.  8. Januvia 100 mg daily.  9. Prevacid 30 mg daily.  10.Lovaza 2 tablets daily.  11.Acidophilus as needed.  12.Demerol 100 mg 1 tablet p.o. q.4h. p.r.n. pain.   DISCHARGE INSTRUCTION SHEET:  Continue a diabetic appropriate diet with  routine monitoring of her blood sugars.  Increase her activity slowly.  May shower and clean incisions gently with soap and water.  She should  cover incisions as needed with a gauze dressing if she develops drainage  but should call if she develops purulent drainage  or erythema  from her incisions, if she has increased pain or fever  greater than 101.  She will see Dr. Arbie Cookey in the vascular vein  specialist office in approximately 2-3 weeks with postoperative ABIs.  Our office will contact her regarding specific appointment date and  time.      Jerold Coombe, P.A.      Larina Earthly, M.D.  Electronically Signed    AWZ/MEDQ  D:  06/08/2007  T:  06/08/2007  Job:  [id number]   cc:   Larina Earthly, M.D.  [facility]  Tera Mater. Evlyn Kanner, M.D.  Loel Dubonnet, MD  Veverly Fells. Excell Seltzer, MD  [name]. [name], MD,FACP,FCCP  Maxwell Caul, M.D.  Rema Fendt, M.D.

## 2010-11-16 NOTE — Op Note (Signed)
[name], [name]             ACCOUNT NO.:  [id number]   MEDICAL RECORD NO.:  [id number]          PATIENT TYPE:  INP   LOCATION:  2550                         FACILITY:  [facility]   PHYSICIAN:  Larina Earthly, M.D.    DATE OF BIRTH:  Jul 08, 1945   DATE OF PROCEDURE:  07/09/2007  DATE OF DISCHARGE:                               OPERATIVE REPORT   PREOPERATIVE DIAGNOSIS:  Left foot ischemia with a nonhealing ulceration  of the left toes.   POSTOPERATIVE DIAGNOSIS:  Left foot ischemia with a nonhealing  ulceration of the left toes.   PROCEDURE:  Left femoral to above knee popliteal bypass with  translocated nonreversed saphenous vein.   ASSISTANT:  Cyndy Freeze, PA-C   ANESTHESIA:  General endotracheal.   COMPLICATIONS:  None.   DISPOSITION:  To recovery room stable.   PROCEDURE IN DETAIL:  The patient was taken to the operating room and  placed in supine position where the left leg and left groin were prepped  and draped in the usual sterile fashion.  An incision was made over the  left femoral pulse and carried down to isolate the common, superficial,  femoral and profunda femoris arteries.  The patient had extensive  posterior plaque in the common femoral artery.  The saphenous vein was  identified at the saphenofemoral junction and was exposed down towards  the knee with a separate incision in the mid thigh for exposure.  There  was branching in the above-knee position and both of these were traced  and the larger of the two was used for bypass.  The vein was of adequate  caliber.  The above-knee popliteal artery was exposed through the distal  vein harvest incision.  The artery was adequate for anastomosis.  A  tunnel was created between the level of the above-knee popliteal artery  and the groin.  The vein was ligated distally and divided.  The  tributary branches of the saphenous vein were ligated with 3-0 and 4-0  silk ties and divided.  The saphenofemoral junction  was occluded with a  Cooley clamp and the saphenous vein was excised from the saphenofemoral  junction.  The saphenofemoral junction was oversewn with a 5-0 Prolene  suture.  The vein was gently dilated and was found to be of good  caliber.   The patient was then given 8,000 units of intravenous heparin.  After  adequate circulation time, the common, superficial, femoral and profunda  femoris arteries were occluded.  The common femoral artery was opened  with an 11 blade and extended longitudinally with Potts scissors.  The  vein was brought into the field, was spatulated and sewn end-to-side to  the common femoral artery with a running 6-0 Prolene suture.  Anastomosis was tested and found to be adequate.  Next, the vein valves  were lysed with a [employer] valvulotome.  This gave excellent flow through  the vein graft.  The vein had been marked to prevent twisting and was  brought through the subcutaneous prior created tunnel.  The tunnel was  below the level of the sartorius muscle.  The above-knee popliteal  artery was occluded proximally and distally and was opened with an 11  blade and extended longitudinally with Potts scissors.  The vein was cut  to appropriate length, was spatulated and sewn end-to-side to the artery  with a running 6-0 Prolene suture.  Three dilators passed without  resistance through the distal anastomosis.  The anastomosis was  completed and flow was restored with excellent graft to __________  Doppler flow in the foot.   The patient was given 50 mg of Promethazine to reverse heparin.  The  wounds were irrigated saline.  Hemostasis with electrocautery.  The  wounds were closed with 2-0 Vicryl in several layers in the groin and  popliteal incision.  The skin was closed with 3-0 subcuticular Vicryl  stitch.  Sterile dressing was applied.   The patient was taken to the recovery room in stable condition.      Larina Earthly, M.D.  Electronically Signed      TFE/MEDQ  D:  07/09/2007  T:  07/09/2007  Job:  [id number]

## 2010-11-16 NOTE — Procedures (Signed)
BYPASS GRAFT EVALUATION   INDICATION:  Followup evaluation of bilateral lower extremity bypass  grafts.   HISTORY:  Diabetes:  Yes.  Cardiac:  No.  Hypertension:  Yes.  Smoking:  Former smoker.  Previous Surgery:  Left fem-pop bypass graft with nonreverse saphenous  vein on 07/09/2007.  Right fem-pop bypass graft with Propaten on  10/30/2007.   SINGLE LEVEL ARTERIAL EXAM                               RIGHT              LEFT  Brachial:                    126                130  Anterior tibial:             128                116  Posterior tibial:            118                128  Peroneal:  Ankle/brachial index:        0.98               0.98   PREVIOUS ABI:  Date:  11/23/2007  RIGHT:  0.95  LEFT:  0.95   LOWER EXTREMITY BYPASS GRAFT DUPLEX EXAM:   DUPLEX:  Doppler arterial waveforms are triphasic proximal to, within  and distal to the bypass grafts bilaterally.  In the distal native right popliteal artery just past the anastomosis of  the graft there is some calcified plaque with an elevated peak systolic  velocity of 229 cm/second.   IMPRESSION:  1. ABIs are stable from previous study bilaterally.  2. Patent fem-pop bypass grafts bilaterally.   ___________________________________________  Larina Earthly, M.D.   MC/MEDQ  D:  02/01/2008  T:  02/01/2008  Job:  [id number]

## 2010-11-16 NOTE — Discharge Summary (Signed)
[name], [name]             ACCOUNT NO.:  [id number]   MEDICAL RECORD NO.:  [id number]          PATIENT TYPE:  INP   LOCATION:  2001                         FACILITY:  [facility]   PHYSICIAN:  Larina Earthly, M.D.    DATE OF BIRTH:  June 08, 1946   DATE OF ADMISSION:  10/30/2007  DATE OF DISCHARGE:  11/01/2007                               DISCHARGE SUMMARY   DISCHARGE DIAGNOSES:  1. 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  2. Non-insulin dependent diabetes mellitus.  3. Hypertension.  4. Elevated cholesterol.   PROCEDURE PERFORMED:  Redo femoral popliteal bypass grafting with  intraoperative angiogram by Dr. Arbie Cookey, October 30, 2007.   COMPLICATIONS:  None.   DISCHARGE MEDICATIONS:  1. Alprazolam 0.25 mg p.o. p.r.n. anxiety.  2. Altace 2.5 mg p.o. daily.  3. Hydrochlorothiazide 25 mg p.o. daily.  4. Aspirin 325 mg p.o. daily.  5. Glyburide 5 mg p.o. daily.  6. Actos plus metformin 15/500 1 p.o. daily.  7. Januvia 10/100 mg p.o. daily.  8. Prevacid 30 mg p.o. daily.  9. Lovaza p.o. b.i.d.  10.TriCor 145 mg p.o. daily.  11.Demerol 100 mg p.o. q.4 h p.r.n. pain, total #40 given.   DISPOSITION:  She has been discharged home in stable condition with her  wounds healing well.  She is instructed to clean the wounds with soap  and warm water.  She is to observe the wounds for increasing drainage,  swelling, redness, pain, fever greater than 101.2.  She is instructed to  increase her activity slowly.  She can walk with assistance.  She may  walk upstairs.  She may use crutches or a walker.  She may shower on Nov 02, 2007.  She is not to drive for 3 weeks or left objects for 3 weeks.  She is to see Dr. Arbie Cookey in 3 weeks with ABIs.  Office will call with the  appointment.   BRIEF IDENTIFYING STATEMENT:  Complete details, please refer to the  typed history and physical.  Briefly, this very pleasant 65 year old  woman has been followed by Dr. Arbie Cookey for peripheral vascular  disease.  She most recently presented with nonhealing ulcerations on the right  great toe consistent with arterial insufficiency.  Dr. Arbie Cookey evaluated  her and found that she had an occluded femoral popliteal bypass.  Her  ABIs in the extremities were 0.3.  Dr. Arbie Cookey felt that she should  undergo revision of the fem-pop bypass.  She was informed of the risks  and benefits of the procedure and after careful consideration, elected  to proceed with the surgery.   HOSPITAL COURSE:  Preoperative workup was completed as an outpatient.  She was admitted to the same-day surgery and underwent the  aforementioned redo femoral popliteal bypass grafting with  intraoperative angiography.  Complete details, please refer to the typed  operative report.  The procedure was without complications.  She was  returned to the postanesthesia care unit, extubated.  On stabilization,  she was transferred to a bed in a surgical stepdown unit.  She was  observed overnight.  Following morning, she was transferred  to a bed on  the surgical convalescent floor.  She was mobilized.  She was walking  and improving with physical therapy.  Her wounds were healing well.  She  felt stable and was discharged home.      Wilmon Arms, PA      Larina Earthly, M.D.  Electronically Signed    KEL/MEDQ  D:  11/01/2007  T:  11/02/2007  Job:  [id number]

## 2010-11-16 NOTE — Procedures (Signed)
BYPASS GRAFT EVALUATION   INDICATION:  Known PVD with new right lower extremity ulcer.   HISTORY:  Diabetes:  Yes.  Cardiac:  Hypertension:  Yes.  Smoking:  Previous.  Previous Surgery:  Status post right fem-pop artery bypass graft,  06/04/2007.  Left fem-pop artery bypass 07/09/2007.  Re-do right fem-pop  bypass graft with Gore-Tex, 10/30/2007.   SINGLE LEVEL ARTERIAL EXAM                               RIGHT              LEFT  Brachial:                    167                160  Anterior tibial:             79                 101  Posterior tibial:            83                 107  Peroneal:  Ankle/brachial index:        0.50               0.64   PREVIOUS ABI:  Date: 02/12/2010  RIGHT:  0.42  LEFT:  0.62   LOWER EXTREMITY BYPASS GRAFT DUPLEX EXAM:   DUPLEX:  1. Patent right femoropopliteal artery bypass graft.  2. Elevated velocities of 272 in the proximal anastomosis.  3. Elevated velocities of 296 cm/s in the inflow artery and 413 cm/s      in the outflow artery.   IMPRESSION:  1. Stable ankle brachial indices bilaterally.  2. Patent right lower extremity bypass graft with areas of elevated      velocities suggestive of >50% at inflow and proximal anastomosis      and >75% in the native artery distal to bypass graft.   ___________________________________________  Quita Skye. Hart Rochester, M.D.   AS/MEDQ  D:  03/23/2010  T:  03/23/2010  Job:  [id number]

## 2010-11-16 NOTE — Procedures (Signed)
BYPASS GRAFT EVALUATION   INDICATION:  Complaining of right foot feeling cold.   HISTORY:  Diabetes:  Yes.  Cardiac:  Congestive heart failure.  Hypertension:  Yes.  Smoking:  No.  Previous Surgery:  Left carotid endarterectomy with DPA on 09/03/2003 by  Dr. Arbie Cookey.  Right femoral-to-popliteal artery (above-the-knee), bypass  graft with composite vein graft, on 06/04/2007.  Left femoral-to-  popliteal artery bypass graft (above-the-knee) with translocated  nonreversed saphenous vein on 07/09/2007 by Dr. Arbie Cookey.   SINGLE LEVEL ARTERIAL EXAM                               RIGHT              LEFT  Brachial:                    120                110  Anterior tibial:             Inaudible          Inaudible  Posterior tibial:            20                 78  Peroneal:                    38                 60  Ankle/brachial index:        0.32               0.65   PREVIOUS ABI:  Date: 08/10/2007  RIGHT:  0.88  LEFT:  >1.0   LOWER EXTREMITY BYPASS GRAFT DUPLEX EXAM:   DUPLEX:  1. Right femoral-to-popliteal artery bypass graft is occluded in the      mid-thigh portion.  2. Doppler arterial waveforms in the left graft are brisk and      monophasic with elevated velocities noted throughout.  Graft is      patent on the left.   IMPRESSION:  1. Occluded right bypass graft.  2. Patent left bypass graft.  3. ABIs are decreased bilaterally.   ___________________________________________  Larina Earthly, M.D.   DP/MEDQ  D:  10/05/2007  T:  10/05/2007  Job:  [id number]

## 2010-11-16 NOTE — Op Note (Signed)
[name], [name]             ACCOUNT NO.:  [id number]   MEDICAL RECORD NO.:  [id number]          PATIENT TYPE:  AMB   LOCATION:  DSC                          FACILITY:  [facility]   PHYSICIAN:  Katy Fitch. Sypher, M.D. DATE OF BIRTH:  [birth year]   DATE OF PROCEDURE:  05/27/2008  DATE OF DISCHARGE:                               OPERATIVE REPORT   PREOPERATIVE DIAGNOSIS:  A 5-day history of pain, rubor, swelling, and  stiffness of right index finger with background medical problems of  diabetes, severe peripheral vascular disease, and Raynaud phenomenon.   POSTOPERATIVE DIAGNOSIS:  Probable subungual abscess, rule out subungual  and apical infarct with significant small-vessel peripheral vascular  disease and background Raynaud phenomenon, rule out septic embolus, rule  out ischemic finger.   OPERATION:  1. Right index finger nail removal, unroofing a subungual abscess with      identification of a probable nail matrix infarct that was debrided      and cultured for aerobic and anaerobic growth.  2. Drainage of apical abscess.  3. Drainage of pulp to be certain that there was not a run around      felon-type infection followed by placement of a Penrose drain in      the pulp.   OPERATIONS:  Katy Fitch. Sypher, MD   ASSISTANT:  Marveen Reeks Dasnoit, PA-C   ANESTHESIA:  2% lidocaine metacarpal head level block of right index  finger.  This was performed in a minor operating room setting.   INDICATIONS:  [name] is a 65 year old woman referred through  the courtesy of Dr. Adrian Prince for evaluation of a difficult finger  infection predicament.   [name] began to experience pain in her right index finger with rubor  of the nail pulp and periungual regions on Friday, May 23, 2008.  She was seen by Dr. Ace Gins of [facility] and  was thought to have an early perionychial-type infection.  She was  placed on Augmentin 875 mg p.o. b.i.d.   Ms.  Bartolome has severe peripheral vascular disease and status post  multiple bypass procedures by Dr. Gretta Began [facility].  She has background type 2 diabetes, hypertension, and  other medical predicaments.   We were contacted earlier today by Dr. Evlyn Kanner for an urgent hand surgery  consult as she was noted have severe pain of a throbbing nature, marked  swelling of the distal and middle phalangeal segments of the finger,  poor capillary refill, and the concern that she had a very aggressive  infection.   [name] was seen on an urgent basis at the [facility].  Her  examination revealed a pale swollen purplish-appearing fingertip with  clear periungual inflammation, severe turgor increase in the pulp,  throbbing pain, and signs of a probable clinical felon and possible  subungual abscess.  There was greenish-yellow discharge from beneath the  nail plate.   She had been treated by Dr. Larina Bras for 5 days with Augmentin without  relief of her pain, swelling, or discoloration.   My clinical impression was that  this finger had borderline vascularity,  possible peripheral vascular disease due to diabetes and small vessel  disease, and a poorly characterized infectious process.   I advised nail plate removal culture and drainage of the pulp to be  certain she did not have an untreated periapical abscess or a untreated  felon.   After informed consent, she is brought to the operating at this time.   In the holding area and in the OR, Ms. Barhorst requested reassurance that  she would not have tissue loss of her finger.   I clearly and transparently pointed out to her with a history of severe  peripheral vascular disease and the nature of her finger's appearance  that indeed she might have peripheral vascular disease that would place  her finger viability in jeopardy.  I could not provide reassurance that  this would not be a disabling predicament.   At the  present time, we simply did not have enough information as to the  nature of her disease process and to date no culture results are  available to my knowledge.   We advised that we would proceed with a proper incision and drainage  process, proper culture, and pain management as this process evolved.   If this was primarily a infectious process, she should have significant  improvement.  If there is a significant component of small vessel  peripheral vascular disease, this could turn out to be the beginning of  a long march towards a shorter digit.   Questions were invited and answered in detail.   PROCEDURE:  [name] was brought to the operating room and placed  in the supine position upon the operating table.   Following routine time-out, 2% lidocaine was infiltrated at the base of  the right index finger to obtain a digital block.  After 10 minutes,  excellent anesthesia was achieved.  The right arm was prepped with  Betadine soap solution and sterilely draped.  The right index finger was  gently wrapped with a gauze wrap and a 0.5-inch Penrose drain was placed  with limited tension at the base of the fingers with digital tourniquet.  Procedure was commenced with testing for anesthesia, which was noted to  be in satisfactory followed by gentle removal of the nail plate with a  Therapist, nutritional.  Upon removal of nail plate, purulent material was  identified and a whitish area of avascular nail matrix was identified  measuring approximately 6 mm in length, 3-4 mm in width consistent with  a possible infarct.  This was soft and undergoing liquefactive necrosis.  This tissue was curetted to the distal phalanx with a microcurette and  tissue samples and pus material sent for aerobic and anaerobic growth as  well as Gram stain.   The apical abscess area was then opened with a small pair of scissors  and a microcurette was used to debride the apical area of the fingertip.  A  small incision was then fashioned along the ulnar aspect of the finger  tip in the mid lateral line to avoid the ulnar proper digital nerve and  tenotomy scissors was used to spread the pulp.  Serous fluid was  evacuated from the pulp.  The pulp was then drained with a small  fragment of Penrose drain.  The nail plate was not replaced.   On a gross basis, this appears to be a possible infarct or subungual  abscess, leading to liquefactive necrosis.  A disturbing predicament is  the general swelling of the entire finger which could be due to small  vascular ischemia.  The tourniquet was released with immediate capillary  refill to the proximal phalangeal segment of the finger.  However, it  took approximately 2 minutes to have normal filling of the pulp.  This  again reinforced with my concern that there might be small the vessel  disease in this finger.   We will obtain an x-ray of the finger to look for signs of vascular  calcification.   The finger was dressed with Adaptic nonadherent dressing, sterile gauze,  and a loose Coban dressing.   Ms. Poth was advised to elevate her hand to heart level, followed by  use of Demerol as needed for pain.  At discharge, she was awake and  alert with stable vital signs.  Her temperature preoperatively was noted  to be 97.7, pulse 80, respirations 20, and blood pressure 115/60.  There  were no apparent complications.      Katy Fitch Sypher, M.D.  Electronically Signed     RVS/MEDQ  D:  05/27/2008  T:  05/28/2008  Job:  [id number]   cc:   Jeannett Senior A. Evlyn Kanner, M.D.

## 2010-11-16 NOTE — Progress Notes (Signed)
[facility]                        PERIPHERAL VASCULAR OFFICE NOTE   KERYN, NESSLER                      MRN:          [id number]  DATE:04/19/2007                            DOB:          1946/06/04    SUBJECTIVE:  [name] was seen back in followup at the [facility]  Peripheral Vascular Office on April 19, 2007.  Ms. Cheetham is a  delightful 65 year old woman with peripheral arterial disease who has  been previously followed by Dr. Samule Ohm.  She underwent lower extremity  ABI's and duplex scanning yesterday and was found to have ABI's in the  severe range.  I am seeing her in followup today and will be assuming  her care from here forward as Dr. Samule Ohm has left the practice.  Ms.  Liskey initially presented with severe claudication and ischemic  ulcerations on the feet.  Her initial ABI's were 0.38 and 0.31 on the  right and left respectively.  She underwent endovascular treatment by  Dr. Samule Ohm back in March of this year. She had overlapping stents placed  in the left superficial femoral artery as well as separate stents in the  right superficial femoral artery at two sites and a stent placed in the  right common iliac artery as well.  She initially had an excellent  response and her ABI's improved to the normal range.  Her symptoms  improved as well.  Over the past few months she has developed recurrent  severe claudication in both calves and continues to have trouble with  ulcerations on the toes as well as difficulty healing.   Her lower extremities noninvasive study yesterday showed an ABI of 0.29  on the right and 0.26 on the left.  There was no demonstrable flow by  PPG in the toes bilaterally.  There was severe stenosis noted in the  distal superficial femoral artery stent with high velocities and string  sign flow.  On the left there also appeared to be stenosis of the mid  superficial femoral artery stent, although the velocities  were not  severely elevated.   CURRENT MEDICATIONS:  1. Altace 2.5 mg daily.  2. Hydrochlorothiazide 25 mg daily.  3. Alprazolam 0.25 mg daily.  4. Actos plus metformin 15/500 mg daily.  5. Cilostazol 100 mg daily.  6. Januvia 100 mg daily.  7. Prevacid 30 mg daily.  8. Aspirin 325 mg daily.  9. Zetia 10 mg daily.  10.Glyburide 2.5 mg daily.  11.Lovaza daily.   ALLERGIES:  DARVON and CODEINE.   PHYSICAL EXAMINATION:  GENERAL APPEARANCE:  On exam she is alert and  oriented and in no acute distress.  VITAL SIGNS:  Weight 168.  Blood pressure 114/66 on the right and 106/70  on the left.  Heart rate 64.  HEENT:  Normal.  NECK:  Normal carotid upstrokes without bruits.  There is a left carotid  endarterectomy scar that is well healed.  LUNGS:  Clear to auscultation bilaterally.  HEART:  Regular rate and rhythm without murmurs or gallops.  ABDOMEN:  Soft, nontender.  No organomegaly.  No abdominal bruits.  EXTREMITIES:  Femoral  pulses are 2+ and equal.  Dorsalis pedis pulse and  posterior tibial pulses are not palpable.  There is dependent rubor of  the feet.  There are dressings on the right toes and there are some  fissures of the distal toes on the right.  The left foot does not have  any significant ulcerations.   ASSESSMENT:  Ms. Carolan is a 65 year old woman with severe lower  extremity peripheral arterial disease and bilateral superficial femoral  artery restenosis.  Her ankle brachial indices are in the severe range  and she does have evidence of critical limb ischemia with nonhealing  foot ulcerations.  While she does not have rest pain, she certainly is  severely limited with claudication symptoms as well.  This is a very  difficult situation in the setting of the patient's financial situation.  She and her husband inform me that they have no insurance and that their  cumulative hospital bill from these endovascular procedures was greater  than $60,000.00.  Now, within  six months, she has severe restenosis and  clearly requires revascularization once again.  I am afraid that long  term durability in this setting with further endovascular treatment is  clearly questionable at best.  She certainly could be treated with  atherectomy or even with covered stenting, but with her early  restenosis, I think she has a high risk of recurrent restenosis.  Factoring in cost makes this even a less desirable approach.  I am going  to review the situation with Dr. Arbie Cookey who has performed Ms. Vold's  carotid endarterectomy.  I think that surgical bypass, especially if she  can have an above knee bypass with venous conduit, would give her much  better long term patency and likely would be more cost effective,  although I don't know the cost to the patient of the surgical procedure.  I will be in contact with Ms. Joanne Gavel after I review the situation with  Dr. Arbie Cookey.  I have recommended that she continue on her current medical  therapy for now.     Veverly Fells. Excell Seltzer, MD  Electronically Signed    MDC/MedQ  DD: 04/19/2007  DT: 04/20/2007  Job #: [id number]   cc:   [name]. [name], MD,FACP,FCCP  Larina Earthly, M.D.  Maxwell Caul, M.D.

## 2010-11-16 NOTE — Procedures (Signed)
BYPASS GRAFT EVALUATION   INDICATION:  Follow up bilateral lower extremity bypass graft   HISTORY:  Diabetes:  Yes  Cardiac:  No  Hypertension:  Yes  Smoking:  Previous  Previous Surgery:  Left fem-pop, above knee, 07/09/2007; right fem-pop  above knee, 10/30/2007.   SINGLE LEVEL ARTERIAL EXAM                               RIGHT              LEFT  Brachial:                    147                130  Anterior tibial:             62                 86  Posterior tibial:            60                 91  Peroneal:  Ankle/brachial index:        0.42               0.62   PREVIOUS ABI:  Date: 04/10/2009  RIGHT:  0.86  LEFT:  1.0   LOWER EXTREMITY BYPASS GRAFT DUPLEX EXAM:   DUPLEX:  Patent bilateral lower extremity bypass graft.  There are  elevated velocities of 228 cm/sec in the right common femoral artery and  207 cm at right proximal anastomosis.  There are elevated velocities of  282 cm/sec at left proximal anastomosis.   IMPRESSION:  1. Bilateral ankle-brachial indices have dropped.  2. These ankle-brachial indices performed today are more consistent      with the study performed on 08/15/2008.  3. Right ankle-brachial index suggests severe arterial disease, and      left ankle-brachial index suggests moderate arterial disease.  4. Patent bilateral femoral-popliteal bypass graft with elevated      velocities as described above.       ___________________________________________  Larina Earthly, M.D.   CB/MEDQ  D:  02/16/2010  T:  02/16/2010  Job:  [id number]

## 2010-11-16 NOTE — Assessment & Plan Note (Signed)
OFFICE VISIT   TRULY, STANKIEWICZ  DOB:  Nov 09, 1945                                       02/12/2010  WUXLK#:[id number]   [name] presents today for follow-up of her bilateral fem-pops  and also her asymptomatic carotid disease.  She requested to be seen  since she was having increasing right leg claudication symptoms.  She  does not have any rest and she reports that the claudication level is  tolerable.  She underwent noninvasive vascular studies which I have  ordered and reviewed with her.  This shows a slight drop in her ankle  arm indices bilaterally to the 40-60% range.  She does have palpable  popliteal pulses.  Her feet are well-perfused with no tissue loss.  Her  carotid duplexes are unchanged with moderate carotid disease  bilaterally.  She has had no symptoms.  I have recommended continued  vascular lab follow-up.  She does have some chronic changes of narrowing  in her common femoral artery on the right above the bypass.  This is not  changed.  I explained that if she had either rest pain, tissue loss or  intolerable claudication that we would recommend arteriography for  evaluation at that time.  She understands and will see [place] on our  vascular lab protocol.     Larina Earthly, M.D.  Electronically Signed   TFE/MEDQ  D:  02/12/2010  T:  02/15/2010  Job:  [id number]

## 2010-11-16 NOTE — Assessment & Plan Note (Signed)
OFFICE VISIT   [name], [name]  DOB:  Jul 09, 1945                                       10/05/2007  OZHYQ#:[id number]   [name] presents today for concern regarding discomfort in her  right great toe.  She has a protracted history of lower extremity  arterial insufficiency, having undergone extensive endovascular  treatment by Dr. Geralynn Rile.  She had early failure of this and is  undergoing staged bilateral fem-pops by me for limb salvage.  The right  fem-pop was on 06/04/2007, left fem-pop was on 07/09/2007.  Her right  fem-pop was compromised by poor quality of her saphenous vein.  She had  early branching and had harvesting of her saphenous vein from her groin  to her ankle.  I did do a composite saphenous vein graft and at our last  visit with her on 08/10/2007 this was patent.  She reports that over the  past several weeks she had noticed recurrent claudication symptoms and  some pain in her right great toe.  Fortunately, she has nearly  completely  healed the multiple ulcerations over both feet.  She  continues to have a palpable pulse in her left popliteal space with a  patent graft.  On repeat vascular lab studies today she does have  occlusion of her right fem-pop graft and patency of her left fem-pop  graft.  Her ankle arm index has dropped to 0.3 on the right.  I had a  very long discussion with [name], her husband present.  I explained  that the indications for reintervention would be progressive rest pain  or any new tissue loss.  I explained that with her age and activity I do  not suspect that she will be able to tolerate a 0.3 ABI with her current  level of ischemia.  I explained that in all likelihood her failure was  related to qualities of vein.  I explained that she would require  prosthetic graft and that a good saphenous graft is better than  prosthetic but a disadvantaged vein is not appropriate to chase this  to try to  reopen it.  This is especially in light of the fact that it  has been occluded for several weeks.  She understands, does not wish to  proceed with surgery at this time.  She will see me again in 3-4 weeks  and will notify should she develop any new difficulty in the interim.   Larina Earthly, M.D.  Electronically Signed   TFE/MEDQ  D:  10/05/2007  T:  10/08/2007  Job:  [id number]   cc:   Veverly Fells. Excell Seltzer, MD

## 2010-11-16 NOTE — Cardiovascular Report (Signed)
NAMEALLEN, [name]             ACCOUNT NO.:  [id number]   MEDICAL RECORD NO.:  [id number]          PATIENT TYPE:  INP   LOCATION:  2504                         FACILITY:  [facility]   PHYSICIAN:  Arturo Morton. Riley Kill, MD, FACCDATE OF BIRTH:  1946-07-02   DATE OF PROCEDURE:  02/26/2009  DATE OF DISCHARGE:                            CARDIAC CATHETERIZATION   Ms. [name] is a very nice 65 year old patient who has been followed by  Dr. Excell Seltzer.  She quit smoking in 2003.  She has diabetes mellitus.  She  presented with symptoms suggestive of unstable angina.  Enzymes were  borderline.  She was seen by Dr. Excell Seltzer and set up for cardiac  catheterization.  She has known peripheral vascular disease with prior  carotid endarterectomy and also fem-pop bypasses bilaterally with failed  stent in SFA.  The current study was done to assess coronary anatomy.   PROCEDURE:  1. Left heart catheterization.  2. Selective coronary arteriography.  3. Selective left ventriculography.  4. Percutaneous stenting of the circumflex coronary artery.   DESCRIPTION OF PROCEDURE:  The patient was brought to the  catheterization laboratory and prepped and draped in usual fashion.  After careful analysis of the groins, we elected to use the left femoral  which had a slightly better pulse.  Using a Smart needle, we were able  to gain access to the femoral artery, and a 5-French sheath was placed.  Following this, views of the left and right coronary arteries were  obtained.  Central aortic and left ventricular pressures were measured  with pigtail.  Ventriculography was performed in the RAO projection.  I  then reviewed the case with Dr. Excell Seltzer.  The patient has some challenged  finances, but she has diabetes.  He has spoken with the patient and her  husband in some detail, and the consensus opinion was that a drug-  eluting stent would be the optimal given the patient's underlying  diabetes.  With this, the patient was  agreeable.  We then proceeded to  plan for percutaneous intervention of a high-grade mid circumflex that  supplies a dominant territory.  The 5-French sheath was upgraded to a 6-  [place] sheath.  Heparin and Integrilin were given largely secondary to  the clopidogrel navie state.  The patient was given double bolus  Integrilin and heparin.  ACT was checked and appropriate for  percutaneous intervention.  An XB 3.5 guide was utilized.  Predilatation  was done with 2.5 x 15 apex balloon over a [place] wire.  Stenting was  performed with a 3.0 x 23 drug-eluting stent to 14-15 atmospheres.  Post  dilatation was then done with a 3.5 Quantum apex balloon.  There was  marked improvement in the appearance of the artery with no evidence of  obvious edge tear.  The procedure was completed.  The femoral sheath was  sewn into place.  She was not felt to be appropriate for closure.  She  was taken to the holding area in satisfactory clinical condition.   HEMODYNAMIC DATA:  1. Central aortic pressure was 178/78, mean 117.  2. LV pressure 180/23.  3. No gradient on pullback across aortic valve.   ANGIOGRAPHIC DATA:  1. On plain fluoroscopy, there is fairly heavily calcified coronaries.      The left main itself is calcified at the ostium.  2. Ventriculography in the RAO projection reveals overall ejection      fraction slightly in excess of 50%.  There may be some inferobasal      hypokinesis.  3. The left main is without significant focal narrowing, although      there is heavy calcification at the ostium and some mild luminal      irregularity.  4. The LAD courses to the apex.  This also demonstrates a lot of heavy      calcification throughout.  There is a tiny first diagonal with 80%      narrowing and a moderate-sized second diagonal with about 50%      narrowing.  There are scattered areas of irregularity with about      40% in luminal reduction in the both the mid and mid distal       vessels.  No critical stenoses are noted.  5. The circumflex is a dominant vessel.  There is a first marginal      branch which appears largely unchanged from the previous study and      represents an intermediate distribution and has about 60% ostial      narrowing and tandem stenoses of about 50%.  Just after the takeoff      of this vessel in the midvessel is a focal eccentric 90% hazy      stenosis compatible with the acute presentation.  This stenosis      ends proximal to the takeoff of the large second marginal which is      a bifurcating vessel.  There is a posterolateral branch with about      40% ostial narrowing and the posterior descending branch bifurcates      distally.  There is luminal irregularity, but a high-grade stenosis      is not visualized, although visualization is somewhat difficult in      this distal territory.  6. The right coronary artery is a nondominant vessel.  It is tiny in      caliber.  The RV branch has an 80-90% stenosis in the distal      portion of the vessel which supplies a small tiny inferior branch      has 90% narrowing.  This vessel constitutes about 1-1.5 mm artery      at tops.   CONCLUSION:  1. Preserved overall LV function with mild inferior wall motion      abnormality.  2. Critical stenosis of the mid circumflex with successful stenting      percutaneously using Xience V drug-eluting platform with      appropriate post dilatation.  3. Scattered diabetic coronary artery disease as noted above.   DISPOSITION:  The patient presented with an acute coronary syndrome.  She has multiple cardiac problems.  Fortunately, she is a non-insulin  requiring diabetic with low SYNTAX score, and no evidence of critical  LAD disease.  The circumflex territory is in the midvessel after the  first marginal.  The lesion is focal.  The vessel was large.  We elected  to use a drug-eluting stent despite social situation, and this has been  discussed  preprocedurally with the patient and her family by Dr. Excell Seltzer,  and we were in agreement prior to  placement of a drug-eluting platform.  The patient has borderline elevation in troponins, and as such we  utilized IIb/IIIa strategy as she is not clopidogrel naive.  She will  need close followup in the Cardiology Clinic and adherence to strict  guidelines.      Arturo Morton. Riley Kill, MD, [facility]: [facility] Rhonda W  Electronically Signed     TDS/MEDQ  D:  02/26/2009  T:  02/26/2009  Job:  [id number]   cc:   Veverly Fells. Excell Seltzer, MD  CV Laboratory

## 2010-11-16 NOTE — Assessment & Plan Note (Signed)
OFFICE VISIT   Kaylee, [name]  DOB:  Aug 17, 1945                                       11/23/2007  [id number]#:[id number]   The patient presents today for followup of her bilateral femoral  popliteal bypasses.  Most recently this was with right redo fem-pop  bypass with a prosthetic Gore-Tex graft on 10/30/2007.  She has done  quite well, she has no claudication type symptoms.  Her left toe  ulcerations have completely healed.  She does have some continued  healing of her right great toe ulceration but all other ulcerations have  healed.  She does have well perfused feet bilaterally.  She does have a  moderate amount of swelling bilaterally.  She has biphasic tibial  waveforms bilaterally with an ankle arm index of 0.95 bilaterally.  I am  quite pleased with her ongoing progress and plan to see her again in 3  months for continued duplex followup.  She will notify us should she  develop any difficulty in the interim.   Larina Earthly, M.D.  Electronically Signed   TFE/MEDQ  D:  11/23/2007  T:  11/27/2007  Job:  [id number]   cc:   Veverly Fells. Excell Seltzer, MD

## 2010-11-16 NOTE — Assessment & Plan Note (Signed)
OFFICE VISIT   [name], [name]  DOB:  1946/02/25                                       05/04/2010  WNUUV#:[id number]   The patient presents today for continued discussion regarding her  extensive diffuse peripheral vascular occlusive disease.  She has had  multiple endovascular and open bypass procedures.  Most recently she has  presented with a new ulcer on her right great toe and went to the  peripheral vascular suite with Dr. [name] where she underwent an  arteriogram and multiple interventions.  Her arteriogram revealed that  she did have 2 focal eccentric areas of narrowing, one in her external  and one in her right common.  She underwent kissing stents in her aortic  bifurcation for addressing the area in her right common iliac artery  which was proximal.  She also underwent stenting of her external iliac.  She did have a patent right femoral to above-knee popliteal bypass with  prosthetic graft but did have a diffuse extensive  high-grade stenosis  throughout her popliteal artery.  She underwent a self-expanding  stenosis in her popliteal artery on the right with a good cosmetic  result.  She does have an area in her common femoral artery on the left  above her left femoral to popliteal bypass with a very focal,  eccentric  appearing stenosis in this area as well.   PHYSICAL EXAMINATION:  Today she does have healing of her right toe  ulceration.  She does have palpable popliteal pulse on the right.  Her  ankle arm index today is 0.88 on the right up from 0.50 on the left.  It  has dropped slightly from 0.64 down to 0.50.   I had a long discussion with this patient and her husband present.  I  explained that she has clearly had recurrent episodes of progressive  disease despite initially very aggressive endovascular and subsequently  open surgical treatment.  I explained that her popliteal stent would  hopefully at least be temporizing but  that there would be a predictable  moderate risk for restenosis or occlusion of this and she may require  further treatment should this occur.  She does have a left leg  claudication throughout her leg and does have the area of high-grade  stenosis and a focal area in her proximal common femoral artery.  I  discussed endovascular and open treatment options.  I feel that since  her left femoral to popliteal bypass is at risk from  limited flow from  this, I would recommend open repair for better long-term durability.  She understands this and wishes to proceed with left common femoral  endarterectomy.  From a scheduling standpoint, she wishes to wait until  the first of January.  I explained that I felt that there was a slight  risk of occlusion of her fem-pop if we waited this long, but also there  would be a slight risk for surgical treatment.  Therefore I feel that it  is acceptable to schedule her surgery for left common femoral  endarterectomy on 07/12/2009.  She will  notify us should she develop  any difficulty in the interim.     Larina Earthly, M.D.  Electronically Signed   TFE/MEDQ  D:  05/04/2010  T:  05/05/2010  Job:  [id number]   cc:  Tera Mater. Evlyn Kanner, M.D.  Jorge Ny, MD

## 2010-11-16 NOTE — Assessment & Plan Note (Signed)
[facility]   NAME:  Kaylee, [name]             ACCOUNT NO.:  [id number]   MEDICAL RECORD NO.:  [id number]      DATE OF BIRTH:  17-Jul-1945   PHYSICIAN:  Maxwell Caul, M.D. VISIT DATE:  04/13/2007                                   OFFICE VISIT   HISTORY:  [name] is a lady who came here for a consultation with  regard to wounds on her right foot and toes.  She would not sign the  consent for treatment form provided by the clinic.  Therefore, she could  not actually physically be treated there, and so this is strictly a  consultation note.  She tells me she did this out of concern for cost as  she is uninsured.   She tells me that she has had ulcers on her right toe for the last 7  months.  She has been treating these herself with topical antibiotics  and Band-Aids.  She states these started as her skin simply split open.  More recently she has been using nonstick pads, Polysporin.  She  describes these as episodically quite painful.  However, at some points  in time, she is not in any pain.  She has noted to mild drainage.  She  has been on doxycycline for the last 20 days prescribed by her primary  physician.   Also, she was walking in her bear feet 3 months ago at the beach.  She  developed a splinter.  This was removed by her husband but has left her  with a nonhealing wound on the right third toe as well.  This has no  pain and only a mild amount of drainage.  She has been treating this  with the same way as she is treating her right first toe.   The patient is a diabetic for the last 7 years.  She states her control  is good.  Most recently, in the early part of this year, she had several  stents placed by Dr. Randa Evens in both her legs for peripheral  vascular disease.  I have no details on this.  She did see a podiatrist  in the summer who did serial debridements of the wounds on her right  toes.  However, the eschar simply returned and I  believe she stopped  seeing him.  I do not have any of these records either.   EXAMINATION:  Her cardiorespiratory exam is really unremarkable.  In her extremities her peripheral pulses are impossible to feel below  the popliteal.  She has delayed capillary refill time.  She tells me she  has recently, over the last month, redeveloped claudication type  symptoms in her legs.   WOUND EXAM:  There are 3 areas of concern, 2 on the right first toe are  small wounds, 1 at the tip, 1 on the plantar surface laterally.  Both of  these are covered with a white eschar.  There is no obvious drainage.  There is some tenderness to palpation.  On the lateral aspect of her  right third toe is a similar wound.  All of these are less than 0.5 cm  in diameter.  However, they are all covered with the same eschar.   IMPRESSION:  Diabetic foot wounds.  The patient was interested in an  approach to these and some estimate of cost.  I have tried my best to  outline this.  This would include debridement of the existing wounds.  Would probably include some screen for osteomyelitis, probably a plain x-  ray to start, CBC diff, and a sedimentation rate.  She may need serial  debridements, and would definitely need a healing sandal to reduce  pressure.  I also think she will need a further vascular evaluation as  she has developed claudication-type symptoms in both her legs occurring  in the setting of peripheral vascular disease status post stent  placements.  I have told her that I cannot give her exact cost  estimates.  However, I do not think that what she is looking to start is  terribly expensive.  If we needed to do MRIs and/or she needed prolonged  antibiotic treatment for osteomyelitis, the expense could increase.  She  was given the option of making an appointment here again so we could  begin the treatment program outlined above.  Hopefully,  she will give this some serious thought.  I did ask her to  weigh the  risks and expense of non treatment, including more serious infections,  hospitalizations, amputations, and even sepsis, et Karie Soda.  She seemed  all to well aware of this.           ______________________________  Maxwell Caul, M.D.     MGR/MEDQ  D:  04/13/2007  T:  04/13/2007  Job:  [id number]   cc:   Rema Fendt, FNP

## 2010-11-16 NOTE — Assessment & Plan Note (Signed)
OFFICE VISIT   [name], [name]  DOB:  [birth year]                                       04/10/2009  ZOXWR#:[id number]   The patient presents today for evaluation of her diffuse peripheral  vascular occlusive disease.  She is status post staged bilateral femoral  popliteal bypasses for nonhealing lesions of the lower extremities, left  leg in January 2009, right leg in April 2009.  She is also status post  left carotid endarterectomy in March 2005.  She looks quite good today.  She did have a recent coronary stent in August 2010.  She had had  episodes of chest and neck fullness and this is resolved following her  stenting.   PHYSICAL EXAMINATION:  She does have a palpable popliteal pulses  bilaterally.  Carotid arteries without bruits.  She does have a well-  healed neck incision.   She reports that over the past several weeks, she has a feeling of  swallowing difficulty and a hang-up in her throat.  She will discuss  this further with Dr. Evlyn Kanner.   Noninvasive vascular laboratory studies today reveal widely patent left  carotid endarterectomy.  She does have moderate stenosis in her right  carotid.  Will follow this with noninvasives.  Her ankle-arm index is  stable bilaterally at 1.0 on the left and 0.86 on the right.   I am quite pleased with her ongoing progress.  She will continue her  walking program and will see [place] again in routine vascular lab follow-up.   Larina Earthly, M.D.  Electronically Signed   TFE/MEDQ  D:  04/10/2009  T:  04/14/2009  Job:  [id number]   cc:   Jeannett Senior A. Evlyn Kanner, M.D.  Veverly Fells. Excell Seltzer, MD  Arturo Morton. Riley Kill, MD, [facility]

## 2010-11-16 NOTE — Assessment & Plan Note (Signed)
[facility]   NAME:  [name], [name]             ACCOUNT NO.:  [id number]   MEDICAL RECORD NO.:  [id number]      DATE OF BIRTH:  1945/09/26   PHYSICIAN:  Theresia Majors. Tanda Rockers, M.D. VISIT DATE:  04/23/2007                                   OFFICE VISIT   SUBJECTIVE:  [name] is a 65 year old lady who was seen a week ago by  Dr. Leanord Hawking for bilateral ulcerations in her feet.  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  Due to well-documented  reasons per Dr. Leanord Hawking, the patient elected not to be treated at that  time.   She returns for follow-up.  In the interim, she reports that she has  developed an additional ulceration on the right hallux.  Her current  symptomatology includes claudication at less than a block.  She is also  complaining of rest pain at night, which is relieved with sitting and  dangling her legs.  Her diabetes is under reasonable control.  She  denies fever.  She denies chest pain, syncope, transient paralysis or  visual changes.   The patient relates a history of 6 months ago having undergone multiple  stents in the lower extremities which were associated with improvement  in her exercise tolerance.  Over the last several weeks however her  exercise tolerance has decreased to become actually worse than her pre-  stenting symptoms.  In addition, she has developed the ulcerations.  There has been no interim fever, malodorous drainage or trauma.   OBJECTIVE:  Blood pressure is 129/76, respirations 16, pulse rate 93,  temperature 98.6.  Capillary blood glucose is 129 mg percent.  Inspection of the lower extremity shows bilateral dependent rubor with  punched out dry hyper-perfused appearing ulcerations on both great toes.  There are areas of breakdown on the volar aspect of the foot on the  right at the metatarsophalangeal joint.  There is associated 2+ edema  bilaterally.  The pedal pulses are indeterminate.  There  is decreased  sensation to the Wienstein filament.   ASSESSMENT:  Significant occlusive peripheral vascular disease.   PLAN:  The patient is being referred to the vein and vascular surgeons  Dr. Tawanna Cooler Early and associates.  She will be discharged from active  follow-up in the [facility].  We have indicated to the patient that we  would be most happy to reevaluate her once her vascular disposition has  been completed.  We have encouraged her to keep her appointment with Dr.  Arbie Cookey at the earliest convenience.  We have given the patient an  opportunity to ask questions.  She seems to understand our clinical  impression and recommendations and indicates that she will be compliant.      Harold A. Tanda Rockers, M.D.  Electronically Signed     HAN/MEDQ  D:  04/23/2007  T:  04/23/2007  Job:  [id number]   cc:   Larina Earthly, M.D.

## 2010-11-16 NOTE — Assessment & Plan Note (Signed)
OFFICE VISIT   MUSKAAN, SMET  DOB:  Jul 23, 1945                                       08/15/2008  KYHCW#:[id number]   The patient comes in today for routine follow-up of her extensive  peripheral vascular occlusive disease.  She has fortunately healed all  lesions on both of her feet.  She is walking and reports that her usual  activities around the house, grocery stores and usual activities she has  no claudication.  She does report that if she tries to walk quickly,  especially uphill, she will have bilateral calf claudication.  She has  been stable from a medical standpoint.  She claims to be treated for non-  insulin-dependent diabetes, hypertension, elevated cholesterol.  She has  not had any new cardiac difficulties.  She continues to not smoke,  having quit in 2003.   On physical exam her femoral pulses are 2+.  I do not palpate popliteal  or distal pulses.  She does have mild swelling in her feet and has  completely healed the toe ulcerations that she had present bilaterally.   She underwent noninvasive vascular for studies in our office today.  Compared with 3 months ago she has a stable weight ABI on the right at  0.67.  On the left she has dropped from 0.85 to 0.65.  On imaging she  does not have any evidence of stenosis in her grafts bilaterally.  She  does have a question of inflow disease.  I had a long discussion with  the patient and her husband.  I explained that I am pleased with her  healing of her tissue loss and also her claudication only.  I did  explain the option of arteriography to determine the source for inflow  potentially of her disease and I do not feel that she has high risk for  loss of progress related to this.  They have a difficult financial  situation in that they have to pay full cash for all interventions due  to non-insurance.  They wish to defer any active treatment currently and  I am comfortable with this.  We  will see her in 6 months with repeat  vascular lab protocol follow-up.   Larina Earthly, M.D.  Electronically Signed   TFE/MEDQ  D:  08/15/2008  T:  08/19/2008  Job:  [id number]   cc:   Jeannett Senior A. Evlyn Kanner, M.D.  Veverly Fells. Excell Seltzer, MD

## 2010-11-16 NOTE — Assessment & Plan Note (Signed)
OFFICE VISIT   KIELY, COUSAR  DOB:  11-28-1945                                       08/10/2007  GNFAO#:[id number]   The patient presents today for staged bilateral femoral-popliteal  bypasses.  Right leg was on December 2008, left leg January 2009.  She  has good healing on all of her vein harvest and bypass incisions.  She  has 2+ popliteal pulses bilaterally.  She does have some moderate  swelling bilaterally.  I cannot palpate pedal pulses.  Her noninvasive  laboratory studies reveal ankle/arm index of 0.88 on the right and 1.0  on the left.  Her foot ulcerations are healing quite nicely.  She is  pleased with her initial result, as am I.  I will see her again in 3  months with protocol followup.   Larina Earthly, M.D.  Electronically Signed   TFE/MEDQ  D:  08/10/2007  T:  08/13/2007  Job:  [id number]

## 2010-11-16 NOTE — Assessment & Plan Note (Signed)
OFFICE VISIT   [name], [name]  DOB:  1945-11-23                                       04/22/2010  ZOXWR#:[id number]   The patient presented to the office today complaining of worsening of an  ulcer on her right first toe.  She is a patient of Dr. Bosie Helper who has  previously had a right fem above knee pop bypass as well as a left fem-  pop bypass.  She most recently had a right external iliac and right  popliteal artery stent placed by Dr. Myra Gianotti on September 27.  Primary  runoff vessel was the peroneal.   The patient was concerned today that she thought that the ulcer had a  greenish appearance and had gotten larger.   She has some pain on palpation of the ulcer.   She is currently applying triamcinolone cream to this once daily.   PHYSICAL EXAM:  Today blood pressure is 135/77 in the left arm, heart  rate 78 and regular.  She has a 1+ right femoral pulse.  She has a 2+  left femoral pulse.  Popliteal and pedal pulses were difficult to  palpate in the right leg.  Both feet are pink, warm and well-perfused.  She has a 1.5 x 0.5 cm ulceration on the medial aspect of her right  first toe.  This has a neuropathic appearance.  It has a good  granulation base.  There is no surrounding erythema.  There is no  cellulitis.  There is no purulent drainage.  There is no exposed bone.   In summary, the patient states that she feels her foot is much warmer  and improved since her recent stenting procedure.  Her concerns are  primarily of the ulceration on the right foot.  I reassured her  regarding this today.  I told her to stop the triamcinolone cream and we  will place antibiotic ointment on the open ulceration and change this  once daily with soap and water washing between dressing changes.  I also  counseled her today in trying to get some pads to off load the pressure  on her right first toe.  She seemed agreeable to this.  I also offered  her today the  possibility of some diabetic prescription shoes as I am  sure neuropathy is contributing to her ulcerations.  However, she did  not wish to have these at this time.  She will follow up with her  regular scheduled appointment with Dr. Arbie Cookey in early November and she  has a protocol scan scheduled at that time as well.     Janetta Hora. Fields, MD  Electronically Signed   CEF/MEDQ  D:  04/22/2010  T:  04/23/2010  Job:  [id number]

## 2010-11-16 NOTE — Procedures (Signed)
BYPASS GRAFT EVALUATION   INDICATION:  Followup evaluation of bilateral lower extremity bypass  grafts.  The patient complains of bilateral claudication with effort.   HISTORY:  Diabetes:  Yes.  Cardiac:  No.  Hypertension:  Yes.  Smoking:  Former smoker.  Previous Surgery:  Left fem-pop bypass graft on 07/09/2007.  Right fem-  pop bypass graft 10/30/2007.   SINGLE LEVEL ARTERIAL EXAM                               RIGHT              LEFT  Brachial:                    138                110  Anterior tibial:             92  Posterior tibial:            86                 90  Peroneal:                                       88  Ankle/brachial index:        0.67               0.65   PREVIOUS ABI:  Date:  05/09/2008  RIGHT:  0.67  LEFT:  0.85   LOWER EXTREMITY BYPASS GRAFT DUPLEX EXAM:   DUPLEX:  On the right Doppler arterial waveforms are monophasic in the  proximal external iliac artery consistent with proximal obstruction.  Doppler arterial waveforms are biphasic within and distal to the bypass  graft.  On the left Doppler arterial waveforms are biphasic proximal to, within  and distal to the bypass graft with an elevated peak systolic velocity  in the proximal left external iliac artery and the distal native  popliteal artery.   IMPRESSION:  1. Right ABI stable from previous study.  2. Left ABI is lower than previously recorded.  3. Patent fem-pop bypass grafts bilaterally.  4. Right iliac artery occlusive disease.  5. 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  6. Left external iliac artery occlusive disease.   ___________________________________________  Larina Earthly, M.D.   MC/MEDQ  D:  08/15/2008  T:  08/15/2008  Job:  [id number]

## 2010-11-16 NOTE — Op Note (Signed)
[name], [name]             ACCOUNT NO.:  [id number]   MEDICAL RECORD NO.:  [id number]          PATIENT TYPE:  AMB   LOCATION:  SDS                          FACILITY:  [facility]   PHYSICIAN:  Di Kindle. Edilia Bo, M.D.DATE OF BIRTH:  Nov 11, 1945   DATE OF PROCEDURE:  06/04/2007  DATE OF DISCHARGE:                               OPERATIVE REPORT   PREOPERATIVE DIAGNOSIS:  Bilateral infra-inguinal arterial occlusive  disease   POSTOPERATIVE DIAGNOSIS:  Bilateral infra-inguinal arterial occlusive  disease   PROCEDURE:  1. Aortogram.  2. Bilateral iliac arteriogram.  3. Bilateral lower extremity run-off.   ANESTHESIA:  Local with sedation.   PROCEDURE IN DETAIL:  The patient was taken to the PV lab at [facility] and  sedated with 1 milligram of Versed and 50 mcg of fentanyl.  Both groins  were prepped and draped in the usual sterile fashion.  After the skin  was infiltrated with 1% lidocaine, the right common femoral artery was  cannulated and a guidewire introduced into the infrarenal aorta under  fluoroscopic control.  A 5-French sheath was introduced over the wire.  Pigtail catheter was positioned at the L1 vertebral body and flush  aortogram obtained.  The catheter was then repositioned above the aortic  bifurcation and oblique iliac projections were obtained.  The bilateral  lower extremity runoff films were then obtained with spot films of the  popliteal arteries.  Catheter was then removed over wire.  Additional  spot films were obtained in the right leg through the right femoral  sheath.   FINDINGS:  There is accessory renal artery on the right and also  accessory renal artery on the left.  No significant renal artery  stenosis identified.  There is no significant occlusive disease or  stenosis within the infrarenal aorta.  Bilateral common iliac arteries  and external iliac arteries are widely patent.  The previously stented  right common iliac artery proximally is patent.   Hypogastric arteries  are patent bilaterally.   On the left side, there is severe diffuse disease of the proximal  superficial femoral artery.  The previously stented area is occluded and  there is reconstitution of the popliteal artery above the knee.  The  common femoral and deep femoral artery are patent.  There is two-vessel  runoff on the left via the peroneal and posterior tibial arteries.  The  anterior tibial artery is occluded.   On the right side,  the superficial femoral artery is occluded  proximally.  There is reconstitution of the above-knee popliteal artery  with single-vessel runoff via the two-vessel runoff via the peroneal  artery and diseased posterior tibial artery.  The anterior tibial artery  is occluded on the right.   CONCLUSIONS:  1. Bilateral superficial femoral artery occlusions.  Previous stented      areas are occluded.  2. Two-vessel runoff bilaterally as described above.      Di Kindle. Edilia Bo, M.D.  Electronically Signed     CSD/MEDQ  D:  06/04/2007  T:  06/04/2007  Job:  [id number]   cc:   Jake Shark A. Tanda Rockers, M.D.  Casimiro Needle  Karren Burly, MD  [name]. [name], MD,FACP,FCCP  Maxwell Caul, M.D.  Rema Fendt, Dr

## 2010-11-16 NOTE — Procedures (Signed)
BYPASS GRAFT EVALUATION   INDICATION:  Followup evaluation of bilateral lower extremity bypass  grafts.   HISTORY:  Diabetes:  Yes.  Cardiac:  No.  Hypertension:  Yes.  Smoking:  Former smoker.  Previous Surgery:  Left fem-pop bypass graft with nonreverse saphenous  vein, right fem-pop bypass graft with Propaten on 10/30/2007.   SINGLE LEVEL ARTERIAL EXAM                               RIGHT              LEFT  Brachial:                    130                117  Anterior tibial:             69                 98  Posterior tibial:            87                 111  Peroneal:  Ankle/brachial index:        0.67               0.85   PREVIOUS ABI:  Date:  02/01/2008  RIGHT:  0.98  LEFT:  0.98   LOWER EXTREMITY BYPASS GRAFT DUPLEX EXAM:   DUPLEX:  On the right common femoral artery Doppler waveform is  monophasic suggestive of inflow obstruction or stenosis.  Doppler arterial waveforms are biphasic within and distal to the right  fem-pop bypass graft.  On the left Doppler arterial waveforms are biphasic proximal to, within  and distal to the bypass graft with no evidence of vein graft stenosis.   IMPRESSION:  1. Right ABI is lower than previously recorded.  2. Left ABI is stable compared to previous study.  3. Patent fem-pop bypass grafts bilaterally.  4. Right iliac occlusive disease.   ___________________________________________  Larina Earthly, M.D.   MC/MEDQ  D:  05/09/2008  T:  05/09/2008  Job:  [id number]

## 2010-11-17 ENCOUNTER — Ambulatory Visit (HOSPITAL_COMMUNITY)
Admission: RE | Admit: 2010-11-17 | Discharge: 2010-11-17 | Disposition: A | Discharge status: Home / Self Care | Payer: Medicare Other | Source: Ambulatory Visit | Attending: Gastroenterology | Admitting: Gastroenterology

## 2010-11-17 ENCOUNTER — Other Ambulatory Visit: Payer: Self-pay | Admitting: Gastroenterology

## 2010-11-17 DIAGNOSIS — I739 Peripheral vascular disease, unspecified: Secondary | ICD-10-CM | POA: Insufficient documentation

## 2010-11-17 DIAGNOSIS — Z9861 Coronary angioplasty status: Secondary | ICD-10-CM | POA: Insufficient documentation

## 2010-11-17 DIAGNOSIS — I7300 Raynaud's syndrome without gangrene: Secondary | ICD-10-CM | POA: Insufficient documentation

## 2010-11-17 DIAGNOSIS — D509 Iron deficiency anemia, unspecified: Secondary | ICD-10-CM | POA: Insufficient documentation

## 2010-11-17 DIAGNOSIS — I10 Essential (primary) hypertension: Secondary | ICD-10-CM | POA: Insufficient documentation

## 2010-11-17 DIAGNOSIS — I2510 Atherosclerotic heart disease of native coronary artery without angina pectoris: Secondary | ICD-10-CM | POA: Insufficient documentation

## 2010-11-17 DIAGNOSIS — K5730 Diverticulosis of large intestine without perforation or abscess without bleeding: Secondary | ICD-10-CM | POA: Insufficient documentation

## 2010-11-17 DIAGNOSIS — Z01812 Encounter for preprocedural laboratory examination: Secondary | ICD-10-CM | POA: Insufficient documentation

## 2010-11-17 DIAGNOSIS — Z5309 Procedure and treatment not carried out because of other contraindication: Secondary | ICD-10-CM | POA: Insufficient documentation

## 2010-11-17 DIAGNOSIS — E119 Type 2 diabetes mellitus without complications: Secondary | ICD-10-CM | POA: Insufficient documentation

## 2010-11-17 DIAGNOSIS — Z79899 Other long term (current) drug therapy: Secondary | ICD-10-CM | POA: Insufficient documentation

## 2010-11-17 DIAGNOSIS — I252 Old myocardial infarction: Secondary | ICD-10-CM | POA: Insufficient documentation

## 2010-11-17 LAB — GLUCOSE, CAPILLARY

## 2010-11-18 ENCOUNTER — Ambulatory Visit (HOSPITAL_COMMUNITY)
Admission: RE | Admit: 2010-11-18 | Discharge: 2010-11-18 | Disposition: A | Discharge status: Home / Self Care | Payer: Medicare Other | Source: Ambulatory Visit | Attending: Gastroenterology | Admitting: Gastroenterology

## 2010-11-18 DIAGNOSIS — I2510 Atherosclerotic heart disease of native coronary artery without angina pectoris: Secondary | ICD-10-CM | POA: Insufficient documentation

## 2010-11-18 DIAGNOSIS — I739 Peripheral vascular disease, unspecified: Secondary | ICD-10-CM | POA: Insufficient documentation

## 2010-11-18 DIAGNOSIS — K5730 Diverticulosis of large intestine without perforation or abscess without bleeding: Secondary | ICD-10-CM | POA: Insufficient documentation

## 2010-11-18 DIAGNOSIS — Z01812 Encounter for preprocedural laboratory examination: Secondary | ICD-10-CM | POA: Insufficient documentation

## 2010-11-18 DIAGNOSIS — I7300 Raynaud's syndrome without gangrene: Secondary | ICD-10-CM | POA: Insufficient documentation

## 2010-11-18 DIAGNOSIS — E119 Type 2 diabetes mellitus without complications: Secondary | ICD-10-CM | POA: Insufficient documentation

## 2010-11-18 DIAGNOSIS — I10 Essential (primary) hypertension: Secondary | ICD-10-CM | POA: Insufficient documentation

## 2010-11-18 DIAGNOSIS — D509 Iron deficiency anemia, unspecified: Secondary | ICD-10-CM | POA: Insufficient documentation

## 2010-11-18 DIAGNOSIS — I252 Old myocardial infarction: Secondary | ICD-10-CM | POA: Insufficient documentation

## 2010-11-18 LAB — GLUCOSE, CAPILLARY: Glucose-Capillary: 180 mg/dL — ABNORMAL HIGH (ref 70–99)

## 2010-11-19 NOTE — Progress Notes (Signed)
[facility]                        PERIPHERAL VASCULAR OFFICE NOTE   [name], [name]                      MRN:          [id number]  DATE:09/20/2006                            DOB:          [birth year]    PRIMARY CARE PHYSICIAN:  [name]. [name], MD,FACP,FCCP.   HISTORY OF PRESENT ILLNESS:  Ms. [name] is a 65 year old lady I have  been following for what was previously minimally symptomatic  claudication despite abnormal ABIs.  ABIs in December 2006 were 0.73 on  the right and 0.45 on the left.  At that time, I recommended exercise  therapy and Pletal.  With that, she was able to walk 30 minutes at 1.5  miles per hour on the treadmill.  Unfortunately, over the past several  months her exercise tolerance has diminished substantially.  In  addition, she has developed two small ulcers on the distal portion of  her right great toe.  I saw her for this 2 weeks ago.  We obtained  repeat lower extremity vascular studies.  These demonstrated a drop in  her ABI bilaterally, now at 0.38 on the right and 0.31 on the left.  No  demonstrable flow could be found in any toes by PPG.  She appears to  have multilevel arterial disease, with monophasic flow in both common  femoral arteries, suggesting inflow disease, as well as probable  occlusion of the right SFA and a distal occlusion of the left SFA.   She has seen some progress in the healing of her right great toe ulcers.  However, they are clearly not entirely healed.  In addition, she  continues to have very disabling short-distance claudication.   PAST MEDICAL HISTORY:  1. Status post left carotid endarterectomy by Dr. Arbie Cookey in 2005.  2. Congestive heart failure, with preserved systolic function, in the      setting of hypertensive crisis.  3. Hypertension.  4. Status post hysterectomy in 1995.  5. Status post tonsillectomy.  6. Status post removal of benign masses from her right breast.  7. Diabetes  mellitus.   ALLERGIES:  1. DARVON.  2. CODEINE.  3. ERYTHROMYCIN.  4. MORPHINE.   CURRENT MEDICATIONS:  1. Altace 2.5 mg daily.  2. HCTZ 25 mg daily.  3. Alprazolam 25 mg daily.  4. Actos plus metformin 15/500, 1 daily.  5. Pletal 100 mg daily.  6. Januvia 100 mg daily.  7. Prevacid 30 mg daily.  8. Aspirin 325 mg daily.  9. Zetia 10 mg daily.  10.Glyburide 2.5 mg daily.   SOCIAL HISTORY:  The patient works as a Futures trader.  She is married, with  two grown children.  Quit smoking in 2003, after 40 years of a 3 pack  per day habit.  Drinks 2 bourbons per day.  Denies illicit drug use.   FAMILY HISTORY:  Father died of a myocardial infarction at 110.  Mother  died of an MI at 62.  A brother has coronary disease and hypertension.  A half-brother has coronary disease.  Both of these brothers developed  the disease in their 55s.   REVIEW OF  SYSTEMS:  Wears glasses.  Frequent symptoms of heartburn.  Chronic knee pain, and occasional edema in both ankles.  Review of  systems is otherwise negative in detail, except as above.   PHYSICAL EXAMINATION:  GENERAL:  She is generally well appearing, in no  distress.  VITAL SIGNS:  Heart rate 100, blood pressure 134/79, weight 166 pounds.  She is 5 feet 4 inches tall.  HEENT:  Normal.  SKIN:  Normal.  MUSCULOSKELETAL:  Normal.  NECK:  She has no jugular venous distention, thyromegaly, or  lymphadenopathy.  Scar from prior left carotid endarterectomy is well  healed.  LUNGS:  Respiratory effort is normal.  Lungs are clear to auscultation.  CARDIAC:  She has a nondisplaced point of maximal cardiac impulse.  There is a regular rate and rhythm, with normal S1 and S2.  There is no  murmur or S3.  There is a soft S4.  ABDOMEN:  Soft, nondistended, nontender.  There is no abdominal bruit.  There is no hepatosplenomegaly and no mass.  Bowel sounds are normal.  EXTREMITIES:  The feet are cool, without clubbing, cyanosis, or edema.  There is  some dependent rubor, and two small ulcers on the distal  portion of the right great toe.  These look improved from 2 weeks ago.  PULSES:  Carotid pulses are 2+ bilaterally, without bruit.  Radial  pulses are 2+ bilaterally.  Femoral pulses are 1+ bilaterally, without  bruit.  Popliteal, DP, and PT pulses are not palpable on either side.   IMPRESSION/RECOMMENDATIONS:  1. Peripheral arterial disease.  Marked progression of her disease,      with probable aortoiliac disease and certain SFA disease      bilaterally.  Will proceed to angiography.  Right leg has the      ulcerations.  Will therefore plan on proceeding with access from      the left.  She clearly needs revascularization.  The only question      is whether this should better be accomplished with endovascular or      open vascular techniques.  Will continue her aspirin and initiate      Plavix.  2. Diabetes mellitus.  Hold Actos and metformin on the day prior to      the procedure and the day      of the procedure.  Hold Januvia and glyburide on the day of the      procedure.  3. Hypertension.  Continue antihypertensives through the procedure.     Salvadore Farber, MD  Electronically Signed    WED/MedQ  DD: 09/20/2006  DT: 09/21/2006  Job #: [id number]   cc:   [name]. [name], MD,FACP,FCCP

## 2010-11-19 NOTE — Progress Notes (Signed)
[name]                        PERIPHERAL VASCULAR OFFICE NOTE   [name], [name]                      MRN:          [id number]  DATE:10/11/2006                            DOB:          [name], [name]    PRIMARY CARE PHYSICIAN:  [name]. [name], MD,FACP,FCCP   HISTORY OF PRESENT ILLNESS:  [name] is a 65 year old lady, now  status post bilateral SFA stenting.  She is now asymptomatic with  respect to her claudication.  Her feet are much warmer.  Right ABI is  improved from 0.38 to 1.0.  Left ABI is improved from 0.31 to 0.92.  Her  only complaint is of some brief paresthesia in her right groin,  occurring less than once a day for under a second.   CURRENT MEDICATIONS:  1. Aspirin 325 mg daily.  2. Plavix 75 mg daily.  3. Altace 2.5 mg daily.  4. HCTZ 25 mg daily.  5. Alprazolam 25 mg daily.  6. Actos plus Metformin 15/500 one daily.  7. Januvia 100 mg daily.  8. Prevacid 30 mg daily.  9. Zetia 10 mg daily.  10.Glyburide 2.5 mg daily.   PHYSICAL EXAMINATION:  She is generally well-appearing, in no distress,  with heart rate 74, blood pressure 136/80 and equal bilaterally.  She  has no jugular venous distention, thyromegaly or lymphadenopathy.  Lungs  are clear to auscultation.  She has a nondisplaced point of maximal  cardiac impulse.  There is a regular rate and rhythm without murmur, rub  or gallop.  Extremities are warm.  The ulcer on her right great toe is  healing nicely.  There is no surrounding erythema and no purulence.   IMPRESSION AND RECOMMENDATIONS:  Doing nicely after bilateral SFA  stenting.  Continue Plavix for a total of 30 days after the most recent  procedure.  We will plan on repeat duplex study in six months.     Salvadore Farber, MD  Electronically Signed    WED/MedQ  DD: 10/11/2006  DT: 10/11/2006  Job #: [id number]   cc:   [name]. [name], MD,FACP,FCCP

## 2010-11-19 NOTE — Op Note (Signed)
NAMESHADDAI, SHAPLEY             ACCOUNT NO.:  [id number]   MEDICAL RECORD NO.:  [id number]          PATIENT TYPE:  AMB   LOCATION:  SDS                          FACILITY:  [facility]   PHYSICIAN:  Salvadore Farber, MD  DATE OF BIRTH:  1946/05/29   DATE OF PROCEDURE:  10/06/2006  DATE OF DISCHARGE:                               OPERATIVE REPORT   PROCEDURE:  1. Stenting of the left superficial femoral artery.  2. Stenting of right common iliac artery.   INDICATIONS:  Ms. Lavallie is a 65 year old woman who presented with  ischemic ulcer on her right great toe, and bilateral lower extremity  short distance claudication.  ABIs were 0.38 on the right and 0.31 on  the left.  Last week, we brought her to angiography.  At that time I  stented the right superficial femoral artery.  She was also noted to  have a right common iliac artery stenosis, and a left SFA stenosis.  She  returns today for treatment of both of those.  In the interim, her right  leg claudication has improved; and she is now limited by the left.   PROCEDURAL TECHNIQUE:  Informed consent was obtained.  Under 1%  lidocaine local anesthesia, a 6-French Terumo glide sheath was placed in  the right common femoral artery using the modified Seldinger technique.  Using a crossover catheter, a Wholey wire was advanced across the aortic  bifurcation; and down into the left superficial femoral artery.  Sheath  was advanced over the dilator into the left common femoral artery.  I  then advanced an angled Glidewire across into the below-knee popliteal.  I attempted to advance an end-hole catheter, but could not advance it  beyond the most distal of the lesions.  I, therefore, removed this wire  and advanced a whisper wire across.   I then predilated over the whisper wire using a 4 x 20 mm Maverick at 10  atmospheres distally and 6 atmospheres slightly more proximally.  I then  exchanged an end-hole catheter for a Wholey wire.  Over  this I proceeded  to stenting of the SFA.  I placed a 7 x 100 mm Xceed stent, distally;  and a 7 x 80 mm Xceed stent more proximally, overlapping the other stent  by approximately 1 cm.  I then postdilated the entirety of the stented  segment using an 6 x 100 mm Agile tract balloon at 8 atmospheres at both  margins; and 10 atmospheres throughout with the exception of the most  distal and proximal 1 cm.  Repeat angiography demonstrated no residual  stenosis, no dissection, and normal flow.  She remained with a 2-vessel  runoff at the completion of the procedure, as she has had previously.   Attention was then turned to the right common iliac artery.  I pulled  the sheath back across into the aorta, and then performed the  angiography.  I then performed pressure pullback after the  administration of intra-arterial nitroglycerin.  That demonstrated a 20  mmHg peak-to-peak gradient.  I, therefore, decided to treat it with  optimize inflow into  the previously stented portion of the right SFA.  I  predilated using an 8 x 20 mm Agile tract balloon for two inflations at  6 atmospheres.  I then stented using a 10 x 38 mm OMNILINK stent  deployed a 10 atmospheres.  Final angiography demonstrated no residual  stenosis, no dissection, and normal flow distally.   The arteriotomy was then closed using an 8-French Angio-Seal device.  Complete hemostasis was obtained.  The patient was then transferred to  the holding room in stable condition having tolerated the procedure  well.   COMPLICATIONS:  None.   IMPRESSION/RECOMMENDATIONS:  Successful stenting of both the left  superficial femoral artery, and the right common iliac artery.  She will  be maintained on Plavix for another 30 days and aspirin is to be  continued indefinitely.      Salvadore Farber, MD  Electronically Signed     WED/MEDQ  D:  10/06/2006  T:  10/06/2006  Job:  [id number]   cc:   [name]. [name], MD,FACP,FCCP

## 2010-11-19 NOTE — Assessment & Plan Note (Signed)
[facility]                            CARDIOLOGY OFFICE NOTE   [name], [name]                      MRN:          [id number]  DATE:09/04/2006                            DOB:          August 02, 1945    PRIMARY CARE PHYSICIAN:  Marga Melnick.   HISTORY OF PRESENT ILLNESS:  [name] is a 65 year old lady who has  had previously minimally symptomatic claudication. Lower extremity  duplex examination in December 2006, demonstrated an AB on the right of  0.73, and on the left at 0.45. We recommended exercise therapy and  Pletal. With that she was able to walk at mile and a half for 30 minutes  on a treadmill. Unfortunately over the past several months her exercise  has weaned off. She is unclear whether her claudication is worse because  the exercise has backed off or visa versa. In addition, over the past 2  weeks she has noted the development of small ulcers on the distal  portion of her right great toe. She thinks these began after cleaning  her shower with Comet while barefoot. There has been no purulence. She  has been treating them with topical antibiotics and has seen some  improvement. She has not had any fever or surrounding erythema.   CURRENT MEDICATIONS:  1. Aspirin 81 mg daily.  2. Zetia 10 mg daily.  3. Altace 2.5 mg daily.  4. Glyburide 5 mg daily.  5. Hydrochlorothiazide 25 mg daily.  6. Alprazolam 2.5 mg daily.  7. Actos plus metformin 15/500 one daily.  8. Pletal 100 mg once daily.  9. Januvia 100 mg once daily.  10.Prevacid 30 mg once daily.   PHYSICAL EXAMINATION:  She is generally well-appearing in no distress,  heart rate 56, blood pressure 100/60 and equal bilaterally. Weight is  164 pounds which is stable from a year ago, but up 20 pounds from 2003.  She had no jugular venous distension, thyromegaly, or lymphadenopathy.  LUNGS: Clear to auscultation. Respiratory effort is normal. She has a  nondisplaced point of  maximal cardiac impulse. There is a regular rate  and rhythm with normal S1 and S2. There is no murmur or S3. There is an  S4.  ABDOMEN: Soft, nondistended, nontender. There is no hepatosplenomegaly.  There are no abdominal bruits and no pulsatile abdominal mass.  EXTREMITIES: Warm without clubbing, or edema. There is some cyanosis of  both distal feet. Less than 5 mm ulcers on the distal portion of the  right great toe. There are 3 of these. No associated purulence. No  erythema of the toe.  Carotid pulses 2+ bilaterally without bruits. Femoral pulses 2+ on  right, and 1+ on the left without bruits. Popliteal pulses not palpable  on either side. DP and PT pulses not palpable on either side.   IMPRESSION/RECOMMENDATIONS:  Claudication with tissue loss on the right  great toe. Not clear to me that the tissue loss is related to ischemia.  Nonetheless we need to consider this. We will recheck ABIs with toe  brachial index on the right great toe. We will then see  her back in 2  weeks to discuss these results. I have told her that if it appears that  she has significant aortoiliac disease we would consider  revascularization. Otherwise would probable leave below the inguinal  ligament disease alone depending upon the course of the ulcers on her  toe.     Salvadore Farber, MD  Electronically Signed    WED/MedQ  DD: 09/04/2006  DT: 09/04/2006  Job #: [id number]   cc:   [name]. [name], MD,FACP,FCCP

## 2010-11-19 NOTE — Op Note (Signed)
[name], KASPAREK                         ACCOUNT NO.:  [id number]   MEDICAL RECORD NO.:  [id number]                   PATIENT TYPE:  INP   LOCATION:  2899                                 FACILITY:  [facility]   PHYSICIAN:  Larina Earthly, M.D.                 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   PROCEDURE:  Left carotid endarterectomy with Dacron patch angioplasty.   SURGEON:  Larina Earthly, M.D.   ASSISTANT:  Pecola Leisure, P.A.-C.   ANESTHESIA:  General endotracheal.   COMPLICATIONS:  None.   DISPOSITION:  To the recovery room neurologically intact.   DESCRIPTION OF PROCEDURE:  The patient was taken to the operating room and  placed in a position where the area of the left neck was prepped and draped  in the usual sterile fashion.  An incision was made in the anterior  sternocleidomastoid and carried down through the platysma with  electrocautery.  The sternocleidomastoid was reflected posteriorly and the  carotid sheath was opened.  The facial vein was ligated with #2-0 silk ties  and divided.  The common carotid artery was encircled with an umbilical tape  and a Rummel tourniquet.  Dissection extended on to the bifurcation.  The  external carotid was encircled with a blue Vesi-loop.  The superior thyroid  artery was encircled with a #2-0 silk Potts tie, and the internal carotid  was encircled with an umbilical tape and the Rumel tourniquet.  The vagus  and hypoglossal nerves were identified and preserved.  The patient was given  7000 units of intravenous heparin.  After an adequate circulation time, the  internal, external and common carotid arteries were occluded.  The common  carotid artery was opened  with a #11 blade and extended onto Pott scissors.  A #10 shunt was passed up the internal carotid and allowed to backbleed, and  down the common carotid where it was secured with a Rumel tourniquet.  The  endarterectomy was begun on the common carotid artery and the plaque was  divided proximally with Pott scissors.  The endarterectomy was extended on  to the bifurcation.  The external carotid was then endarterectomized with  the eversion technique, and the internal carotid was endarterectomized in an  open fashion.  The remaining atheromatous debris was removed from the  endarterectomy plane.  The Finesse Hemashield Dacron patch was brought onto  the field and was sewn as a patch angioplasty with a running #6-0 Prolene  suture.  Prior to the completion of the anastomosis, the shunt was removed  and the usual flushing maneuvers were undertaken.  The anastomosis was then  completed.  The external, followed  by the common, followed by the internal  carotid artery occlusion clamps were removed.  Excellent flow  characteristics were noted with the hand-held Doppler in the internal and  the external carotid arteries.  The patient was given 50 mg of Protamine to  reverse the heparin.  The wounds were irrigated with saline.  Hemostasis  with electrocautery.  The wounds were closed with several #3-0 Vicryl  sutures to reapproximate the sternocleidomastoid over the carotid sheath.  Next the platysma was closed with running #3-0 Vicryl suture, and finally  the skin was closed with #4-0  subcuticular Vicryl stitch.  Benzoin and Steri-Strips were applied.  The patient was awakened in the operating room neurologically intact, and  was transferred to the recovery room in stable condition.                                               Larina Earthly, M.D.    TFE/MEDQ  D:  09/02/2003  T:  09/02/2003  Job:  [id number]   cc:   [name]. [name], M.D. [facility]

## 2010-11-19 NOTE — H&P (Signed)
NAMEKEYUNA, CUTHRELL                         ACCOUNT NO.:  [id number]   MEDICAL RECORD NO.:  [id number]                   PATIENT TYPE:  INP   LOCATION:  NA                                   FACILITY:  [facility]   PHYSICIAN:  Larina Earthly, M.D.                 DATE OF BIRTH:  09-Apr-1946   DATE OF ADMISSION:  09/02/2003  DATE OF DISCHARGE:                                HISTORY & PHYSICAL   PRIMARY CARE PHYSICIAN:  [name]. [name], M.D.   CHIEF COMPLAINT:  Carotid artery disease.   HISTORY OF PRESENT ILLNESS:  This is a 65 year old female referred by Dr.  [name] for evaluation of carotid artery disease.  The patient was evaluated  for a carotid bruit that included a carotid duplex at Dr. Frederik Pear office on  August 08, 2003.  This revealed severe stenosis in the left internal  carotid artery and mild stenosis in the right system.  The patient has been  asymptomatic with no prior neurological events.  The patient specifically  denies amaurosis fugax.  After evaluation by Dr. Arbie Cookey, it was his opinion  that the patient should have a left carotid endarterectomy for reduction of  stroke risk.  The patient had ABI's performed today at the CVTS office.  The  patient denies headache, nausea, vomiting, vertigo, history of fall,  seizures, numbness and tingling, muscle weakness, speech impairment,  dysphagia, and syncope as well as memory loss or confusion.  The patient  presented to the CVTS office today for history and physical examination  prior to surgery.   PAST MEDICAL HISTORY:  1. Carotid artery disease.  2. Non-insulin dependent diabetes mellitus.  3. Hypertension.  4. Hyperlipidemia.  5. Congestive heart failure in January 2003.  6. Reflux.  7. History of kidney stones.  8. History of lower extremity claudication type symptoms.  9. Peripheral vascular occlusive disease.   PAST SURGICAL HISTORY:  1. Hysterectomy 12 years ago.  2. Lumpectomy x2 in the right breast, both of  which were benign     approximately 20 years ago.  3. Tonsillectomy.   MEDICATIONS:  1. Altace 2.5 mg one p.o. b.i.d.  2. Glyburide 5 mg p.o. daily.  3. HCTZ 25 mg p.o. daily.  4. Alprazolam 0.25 mg one to two p.o. daily.   ALLERGIES:  1. ERYTHROMYCIN TYPE ANTIBIOTICS.  2. CODEINE.  3. DARVON.   REVIEW OF SYSTEMS:  Please see HPI for significant positives and negatives.   FAMILY HISTORY:  Mother:  Myocardial infarction x2.  Father:  Myocardial  infarction.  Brother:  Hypertension.   SOCIAL HISTORY:  This is a married female with two children.  She is  retired.  The patient does drink approximately two bourbons per day.  The  patient quit smoking in January 2003, after smoking 2 to 3 packs per day for  29 years.   PHYSICAL EXAMINATION:  VITAL SIGNS:  Blood  pressure 138/82, pulse 100,  respirations 12.  GENERAL:  This is a 65 year old white female in no acute distress.  She is  alert and oriented x3.  HEENT:  Normocephalic, atraumatic.  Pupils equal, round, reactive to light  and accommodation.  Extraocular movements were intact.  There are no  cataracts, glaucoma, or macular degeneration.  NECK:  Supple.  There is no JVD.  There is a left bruit auscultated, but no  bruit on the right.  There is no lymphadenopathy.  CHEST:  Symmetrical inspiration.  LUNGS:  No wheezes, rhonchi, or rales.  CARDIAC:  Regular rate and rhythm, no murmurs, rubs, or gallops.  ABDOMEN:  Soft, nontender, nondistended, there are bowel sounds x4, no  masses.  GENITOURINARY:  Deferred.  RECTAL:  Deferred.  EXTREMITIES:  No cyanosis, clubbing, edema, or ulcerations.  Temperature is  warm.  PERIPHERAL PULSES:  Carotids 2+ bilaterally, femorals 2+ bilaterally,  popliteal and pedal pulses were not palpable.  NEUROLOGIC:  Nonfocal.  Gait is steady.  Deep tendon reflexes are 2+.  Motor  strength is 5/5.   ASSESSMENT:  Left internal carotid artery stenosis.   PLAN:  Left carotid endarterectomy by Dr.  Arbie Cookey on September 02, 2003, at [facility].  Dr. Arbie Cookey has seen and evaluated the patient prior to this  admission, and has explained the risks and benefits of the procedure, and  the patient has agreed to continue.      Pecola Leisure, [place]                      Larina Earthly, M.D.    AY/MEDQ  D:  08/29/2003  T:  08/29/2003  Job:  [id number]

## 2010-11-19 NOTE — Discharge Summary (Signed)
[facility]  Patient:    [name], [name] Visit Number: [id number] MRN: [id number]          Service Type: MED Location: [phone] Attending Physician:  [name] Dictated by:   [name], M.D. Admit Date:  07/07/2001 Discharge Date: 07/11/2001   CC:         [name]. [name], M.D.  [name]. [name], M.D. [facility]  [name]. [name], M.D. [facility], cardiology   Discharge Summary  DISCHARGE DIAGNOSES:  1. Flash pulmonary edema/congestive heart failure secondary to poorly     controlled hypertension.  No evidence of ischemia by adenosine Cardiolite     this admission.  2. Nonobstructive coronary artery disease.  Coronary angiogram on     July 09, 2001, revealing multivessel diffuse disease with ejection     fraction mildly diminished at 45 to 50% with mild global hypokinesis, no     evidence of renal artery stenosis, cardiac index of 2.2, wedge of 20.     Coronary arteries normal left main, left anterior descending 25 to 30%     stenoses, diagonal I mid 80%, diagonal II mid 70%, circumflex diffuse     disease 60 to 70% stenosis before posterolateral artery, obtuse marginal I     proximal 70%, mid 70%, obtuse marginal II proximal long 50%, right     coronary artery mid 70%, right ventricular branch 80% stenosis.  Adenosine     Cardiolite again confirming an ejection fraction of 45% with no evidence     of ischemia.  3. Poorly controlled hypertension, long-standing with a supposed history of     intolerance to multiple blood pressure medications.  No evidence of     renal artery disease by coronary angiogram this admission.  4. Diabetes mellitus poorly controlled with a recent A1C of 7.9 diagnosed     five years ago, initiated on oral hypoglycemics this admission.  5. Dyslipidemia with a total cholesterol of 218, triglycerides 275, and LDL     of 117.  Initiated on statin this admission.  6. Tobacco use, recent cessation.  7. Anxiety  disorder not otherwise specified.  8. Alcohol use, suspect abuse. No evidence of withdrawal this admission.  9. Medical noncompliance. 10. History of recent bilateral lower lobe pneumonia on Tequin. 11. Status post bilateral breast biopsies. 12. Status post hysterectomy.  DISCHARGE MEDICATIONS: 1. Hydrochlorothiazide 25 mg p.o. q.d. 2. Aspirin 81 mg p.o. q.d. 3. Toprol XL 100 mg p.o. q.d. 4. Altace 2.5 mg p.o. b.i.d. 5. Glyburide 5 mg p.o. q.d. 6. Simvastatin 10 mg p.o. q.d. 7. Xanax 0.25 mg t.i.d. x4 days, then b.i.d. x4 days, then q.d. x4 days,    then off.  DISCHARGE DIET:  The patient instructed to follow a 2 g sodium diet, low fat, ADA, 2000 calorie diet.  DISCHARGE INSTRUCTIONS:  Limit alcohol to one serving a day.  Encouraged to stop smoking.  She was instructed to check CBGs q.a.c. and h.s. and to provide [name]. [name], M.D., with her log.  Also to check blood pressure three times a week and to notify the M.D. if it is persistently greater than 180/100.  FOLLOW-UP:  She will follow up with [name]. [name], M.D., in two to three weeks, and establish care with [name] in two weeks. The patient interested in transitioning care from [name]. [name], M.D., to [name] for insurance reasons.  Would recommend in her follow-up to review her diabetic control and long-term  management issues.  Review blood pressure readings, check BMET and LFTs given recent initiation of ACE inhibitors and statin.  Also will need long-term lipid management.  CONDITION ON DISCHARGE:  The patient is stable, in no acute distress.  Weight is 131 pounds.  Blood pressure in the 150 to 160/80 range. Vitals are otherwise stable.  CONSULTANTS:  [name]. [name], M.D., of cardiology.  REASON FOR ADMISSION:  The patient is a 65 year old white female with a past medical history significant for poorly controlled hypertension and diabetes, she was readmitted to the hospital on July 07, 2001, after  just being discharged for recurrent shortness of breath/pulmonary edema felt secondary to poorly controlled hypertension.  Prior to her discharge, the patient was advised to obtain an inpatient cardiology evaluation; however, the patient declined at the time and had another episode of shortness of breath consistent with acute CHF exacerbation, bringing her back to the hospital for a repeat evaluation.  HOSPITAL COURSE:  #1 - HYPERTENSIVE EMERGENCY/LONGSTANDING POORLY CONTROLLED HYPERTENSION:  With initial blood pressure readings of 270/160, the patient was initially controlled with labetalol and nitroglycerin drips, subsequently transitioned to oral medications.  Her target blood pressure is less than 130/80 and she was still slightly above this target at the time of discharge; however, it was felt that she was in a safe range and further modification in her blood pressure regimen can be made by her primary care [name]. She did report an intolerance to multiple blood pressure medications but could not recall names and records from [name] office were not yet received to review any specific drug intolerance.  So far, there is no evidence of intolerance to any medications that she is currently on including the Toprol, Altace, and hydrochlorothiazide which preceded her admission.  Of note, there was no evidence of renal artery stenosis during her coronary angiogram.  #2 -  FLASH PULMONARY EDEMA/CONGESTIVE HEART FAILURE:  Resolved at the time of discharge. Felt to be secondary to hypertensive emergency.  The patient had no recurrent symptoms after admission.  A 2-D echo was pending at the time of this dictation; however, the patient did undergo coronary angiogram with results noted above.  Diffuse multivessel disease, however, no critical or obstructive lesions.  Additionally, the patient underwent an adenosine  Cardiolite study on July 10, 2001, which did not reveal any areas  of ischemia, confirming that her pulmonary edema was most likely due to her hypertensive emergency.  #3 - DYSLIPIDEMIA:  The patients LDL is 117 which is above her target of less than 100.  I would expect that as her glycemic control improves, that her lipid profile will also improve; however, in the interim given the presence of atherosclerotic disease, I will initiate a statin. She will need further monitoring of her lipids and LFTs as an outpatient.  #4 -  TOBACCO USE:  The patient was strongly and repeatedly encouraged to stop smoking and she seems motivated to do so. She deferred patches.  #5 - DIABETES MELLITUS:  Hemoglobin A1C of 7.9 this month.  The patient initiated on glyburide. The patient was instructed to monitor her CBGs and to review her diary with her primary care Tyquisha Sharps.  #6 - RECENT BILATERAL LOWER LOBE PNEUMONIA:  Treated with Tequin, resolved.  #7 - HISTORY OF ALCOHOL USE, QUESTION DEPENDENCY OR ABUSE:  No evidence of withdrawal this admission; however, she did manifest frequent episodes of anxiety responding to benzodiazepines.  She was strongly encouraged to limit alcohol intake to one drink  a day.  If she was unable to do so, she should seek help for alcohol dependency. She was also provided with a prescription for benzodiazepine taper informing her that it was not my intention to provide a longstanding prescription for these medications.  #8 - ANXIETY DISORDER NOT OTHERWISE SPECIFIED:  See above.  #9 - HISTORY OF MEDICAL NONCOMPLIANCE. Dictated by:   [name], M.D. Attending Physician:  [name] DD:  07/11/01 TD:  07/11/01 Job: [id number]

## 2010-11-19 NOTE — Discharge Summary (Signed)
[name], [name]                         ACCOUNT NO.:  [id number]   MEDICAL RECORD NO.:  [id number]                   PATIENT TYPE:  INP   LOCATION:  3313                                 FACILITY:  [facility]   PHYSICIAN:  Pecola Leisure, PA                DATE OF BIRTH:  [birth year]   DATE OF ADMISSION:  09/02/2003  DATE OF DISCHARGE:  09/03/2003                                 DISCHARGE SUMMARY   ADMISSION DIAGNOSIS:  Carotid artery disease.   PAST MEDICAL HISTORY:  1. Carotid artery disease.  2. Non-insulin-dependent diabetes mellitus.  3. Hypertension.  4. Hyperlipidemia.  5. Congestive heart failure in January of 2003.  6. Reflux.  7. History of kidney stones.  8. History of lower extremity claudication-type symptoms.  9. Peripheral vascular occlusive disease.   PAST SURGICAL HISTORY:  1. Hysterectomy 12 years ago.  2. Lumpectomy x2 in the right breast both of which were benign approximately     20 years ago.  3. Tonsillectomy.   ALLERGIES:  ERYTHROMYCIN TYPE ANTIBIOTICS, CODEINE, DARVON.   DISCHARGE DIAGNOSIS:  Left carotid artery stenosis status post left carotid  endarterectomy with Dacron patch angioplasty.   BRIEF HISTORY:  This is a 65 year old female referred by [name] for  evaluation of carotid artery disease.  The patient was evaluated for a left  carotid bruit that included a carotid duplex at Dr. Frederik Pear office on  August 08, 2003.  This revealed severe stenosis in the left internal  carotid artery and mild stenosis in the right system.  The patient has been  asymptomatic with no prior neurological event.  The patient denies amaurosis  fugax.  After evaluation by Dr. Arbie Cookey it was his opinion that the patient  should have a left carotid endarterectomy for reduction of stroke risk.  The  patient denied symptoms of TIA, CVA, cardiac symptoms, and GI symptoms.   HOSPITAL COURSE:  The patient was admitted and taken to the OR on September 02, 2003 for a  left carotid endarterectomy with Dacron patch angioplasty.  The  patient tolerated the procedure well.  The patient was hemodynamically  stable immediately postoperatively and was taken to the post-anesthesia care  unit in stable condition.  The patient was extubated without problems and  awoke from anesthesia neurologically intact.  The patient's postoperative  course has progressed as expected.  On postoperative day one the patient was  without complaint, however, she was apprehensive about going home.  The  patient was afebrile with a temperature of 99.4, her vital signs were stable  with a blood pressure of 110/65, heart rate of 90 and O2 saturation of 95%  on room air.   On physical exam cardiac regular rate and rhythm with a 1/6 systolic murmur.  Lungs were clear to auscultation bilaterally.  The abdomen was soft,  nontender, nondistended with positive bowel sounds.  The incision was clean,  dry and  intact with no evidence of hematoma, and the patient was  neurologically intact.  The patient needs to ambulate and eat prior to  discharge.  Pending that she is able to do these things without complication  it is felt that the patient will be stable for discharge at this time.   LABORATORY DATA:  On September 03, 2003 CBC revealed a white count of 7200,  hemoglobin 11.9, hematocrit 33.7, platelets 274,000.  On September 03, 2003 BMP  revealed a sodium of 134, potassium 3.5, BUN 9, creatinine 0.8, glucose 236.   CONDITION ON DISCHARGE:  Improved.   DISCHARGE INSTRUCTIONS:  The patient is to resume her home medications which  include:  1. Altace 2.5 mg p.o. b.i.d.  2. Glyburide 5 mg p.o. daily.  3. Hydrochlorothiazide 25 mg p.o. daily.  4. Alprazolam 0.25 mg 1-2 p.o. daily.  5. Demerol 50 mg one p.o. q.3-4h p.r.n. pain.   ACTIVITY:  No driving. No strenuous activity.  1. The patient is to continue daily breathing, walking exercises.   DIET:  Low salt, fat and cholesterol with diabetic  restrictions.   WOUND CARE:  The patient may shower daily and clean the incision with soap  and water.  If the incision becomes red, swollen or painful or drains, or if  the patient has a fever of 101 degrees Fahrenheit she is to call the CVTS  office at [phone].   FOLLOW UP:  The patient has a follow up appointment with Dr. Arbie Cookey on September 25, 2003 at 10:30 a.m.                                                Pecola Leisure, PA    AY/MEDQ  D:  09/03/2003  T:  09/03/2003  Job:  [id number]

## 2010-11-19 NOTE — Discharge Summary (Signed)
Kaylee, [name]             ACCOUNT NO.:  [id number]   MEDICAL RECORD NO.:  [id number]          PATIENT TYPE:  OIB   LOCATION:  6526                         FACILITY:  [facility]   PHYSICIAN:  Salvadore Farber, MD  DATE OF BIRTH:  22-Aug-1945   DATE OF ADMISSION:  09/27/2006  DATE OF DISCHARGE:  09/28/2006                               DISCHARGE SUMMARY   BRIEF HOSPITAL COURSE:  [name] presented on September 27, 2006 for  planned lower extremity angiography and underwent stenting of the right  superficial artery in two places.  We had planned for discharge later  that same day.  However, after 6 hours at bedrest she had rebleed from  her left groin access site.  She was therefore placed in observation  overnight for continued bedrest. She had no additional bleeding after  additional bedrest was able to ambulate without further bleeding.  She  had no hematoma or bruit.  Hematocrit dropped from 39 to 33 which likely  represents a combination of blood loss and dilution with her substantial  hydration prior to and after the procedure.  She has an easily  dopplerable and biphasic posterior tibial pulse on the right.   DISCHARGE MEDICATIONS:  1. Aspirin 325 mg daily.  2. Plavix 75 mg daily.  3. Altace 2.5 mg daily.  4. HCTZ 25 mg daily.  5. Xanax 0.25 mg daily.  6. Pletal 100 mg twice daily.  7. Januvia 100 mg once daily.  8. Glyburide 2.5 mg once daily.  9. Prevacid 30 mg once daily.  10.Zetia 10 mg once daily.   She is not to resume the Actos and metformin as we plan repeat procedure  for revascularization of the left leg next week.  Will resume it  thereafter.   ACTIVITY:  No lifting greater than 10 pounds.  Groin access site  discharge instructions given in writing.   FOLLOW UP:  The patient will present on October 06, 2006 at 6:00 a.m. for  percutaneous revascularization of the left SFA.      Salvadore Farber, MD  Electronically Signed     WED/MEDQ  D:  09/28/2006   T:  09/28/2006  Job:  [id number]

## 2010-11-19 NOTE — Consult Note (Signed)
[place]. [facility]  Patient:    [name], [name] Visit Number: [id number] MRN: [id number]          Service Type: MED Location: [facility] [id number] Attending Physician:  Gracelyn Nurse Dictated by:   [name], M.D. [facility] Proc. Date: 07/07/01 Admit Date:  07/07/2001   CC:         Gracelyn Nurse, M.D., [facility]. [name], M.D. [facility]   Consultation Report  INDICATION FOR CONSULTATION:  Evaluation of pulmonary edema in the setting of a hypertensive emergency.  HISTORY OF PRESENT ILLNESS:  The patient is a very pleasant 65 year old woman with a history of fairly uncontrolled hypertension as well as diabetes.  She has a history of hyperlipidemia as well.  There is a history of medical noncompliance as well.  She was in her usual state of health until two days ago when she awoke and felt suddenly short of breath and eventually went to the emergency department.  She does not remember the events resulting in her coming to the ER.  She was quite hypertensive, and her blood pressure and dyspnea resolved, and she was sent home.  She did well until this a.m. when she awoke at 4 in the morning with worsening shortness of breath, nausea, diaphoresis, and sensation of diarrhea.  She subsequently defecated but continued to feel shortness of breath and diaphoretic and presented to the emergency room.  At that time, she was in sinus tachycardia with a blood pressure by report of 270/130.  She was treated with IV Lasix and IV nitrates with improvement in her symptoms.  The patient specifically denies chest pain. She denies any fevers or chills.  She states that she felt like she was dying.  PAST MEDICAL HISTORY:  As previously noted.  There is a history of hysterectomy as well.  SOCIAL HISTORY:  The patient is married and lives in [place] with two children.  She drinks two drinks a day.  She smokes two packs per day and has done so for nearly 40  years.  FAMILY HISTORY:  Notable for her mother dying of MI at the age of 60 and father dying of MI at the age of 72.  She has a brother who is 64 with coronary artery disease.  MEDICATIONS: 1. Hydrochlorothiazide 25 mg a day. 2. Tequin 400 mg a day. 3. Phenergan. 4. Aspirin.  REVIEW OF SYSTEMS:  Notable for history of chills and sweats with her shortness of breath.  There is no adenopathy.  She denies any vision changes or hearing changes.  She denies any skin problems except for dry skin in the winter time.  She denies chest pain, orthopnea, or syncope. There is no claudication.  She denies any urinary frequency or dysuria.  She denies any focal weakness.  She denies any vomiting or diarrhea.  She does have nausea. She denies any abdominal discomfort.  The rest of her Review of Systems is negative.  PHYSICAL EXAMINATION:  GENERAL:  She is a pleasant, well-appearing, middle-aged woman in no distress.  VITAL SIGNS:  Blood pressure now was 150/60, pulse 87 and regular, respirations 20.  Oxygen saturation was 98% on 2 liters.  Weight was 136 pounds.  HEENT:  Normocephalic, atraumatic.  Pupils equal and round.  Oropharynx was moist.  NECK:  No bruits or thyromegaly.  She had 7 to 8 cm of jugular venous distension.  CARDIOVASCULAR:  Regular rate and rhythm with a grade 2/6 systolic murmur  heard best at the right upper sternal border.  There was an S4 gallop present.  LUNGS:  Rare basilar rales.  ABDOMEN:  Soft, nontender, nondistended.  EXTREMITIES:  No peripheral edema.  There was no cyanosis or clubbing.  NEUROLOGIC:  She was grossly intact with no obvious focal findings.  LABORATORY DATA:  Chest x-ray showed pulmonary edema.  EKG demonstrates sinus tachycardia with LVH and poor R wave progression, nonspecific inferior and lateral ST-T changes.  Initial ABG demonstrated a pH of 7.3, PCO2 15, and a PO2 of 83 on 1005 ventimask.  Her initial troponin was 0.06.  The CK  and MB were 51 and 1.9 and 47 and 2.2, respectively.  Her BNP was 1040.  Hemoglobin A1C was 7.9.  The cholesterol was 218, and the LDL was 117.  IMPRESSION: 1. Congestive heart failure. 2. Severe hypertension. 3. Hyperlipidemia. 4. Diabetes mellitus. 5. Tobacco abuse.  DISCUSSION:  The patients pulmonary edema may be secondary to a hypertensive emergency.  However, it may also be related to coronary disease.  I have recommended that she undergo left and right heart catheterization to clearly define her coronary anatomy, LV function, and right and left heart pressures. In addition, I think she needs to be initiated on diabetic therapy, and this can be done as an inpatient.  We will also plan to adjust her medications for control of blood pressure.  In addition, we will plan on obtaining D-dimer and continue her on IV nitrates and Lasix and low-dose beta blocker. Dictated by:   [name], M.D. [facility] Attending Physician:  Marcelino Duster D DD:  07/07/01 TD:  07/07/01 Job: [id number]

## 2010-11-19 NOTE — Cardiovascular Report (Signed)
[place]. [facility]  Patient:    [name], [name] Visit Number: [id number] MRN: [id number]          Service Type: MED Location: [phone] Attending Physician:  Gracelyn Nurse Dictated by:   Rollene Rotunda, M.D. [facility] Proc. Date: 07/09/01 Admit Date:  07/07/2001   CC:         [name], M.D., [facility]   Cardiac Catheterization  DATE OF BIRTH:  Dec 19, 1945  PROCEDURE:  Left and right heart catheterization/coronary arteriography.  CARDIOLOGIST:  Rollene Rotunda, M.D. [facility]  INDICATION:  Evaluate patient with rapid onset of pulmonary edema.  PROCEDURAL NOTE:  Left heart catheterization was performed via the right femoral artery.  Right heart catheterization was performed via the right femoral vein.   Both vessels were cannulated using an anterior wall puncture. A #6 French arterial sheath and a #8 French venous sheath were inserted via the modified Seldinger technique.  A Swan-Ganz catheter and preformed Judkins catheters were utilized as well as a pigtail catheter.  The patient tolerated the procedure well and left the lab in stable condition.  RESULTS:  HEMODYNAMICS:  LV: 143/17, AO: 142/64.  CORONARIES:  The left main was normal.  Left anterior descending artery:  The LAD had diffuse luminal irregularities with scattered 25 to 30% lesions.  The first diagonal was small with mid 80% stenosis.  A second diagonal was medium size with ostial 70% followed by mid 70% stenosis.  Circumflex:  The circumflex was a large dominant vessel.  It had diffuse luminal irregularities in the A-V groove.  There was a distal 60 to 70% stenosis in the A-V groove before a small to medium size posterolateral.  The first obtuse marginal was a large branching vessel.  It had tandem proximal and mid 70% lesions.  The OM-3 was large with proximal large 50% stenosis.  Right coronary artery:  The right coronary artery was a small  nondominant vessel.  It had diffuse proximal disease with mid 70% stenosis.  The RV branch was small and had a focal 80% stenosis.  PRESSURES:  Right atrium 14, RV 39/16, pulmonary capillary wedge pressure 20, PA 37/23, cardiac output/cardiac index (thermodilution) 3.6/2.2.  LEFT VENTRICULOGRAM:  A left ventriculogram was obtained in the RAO projection.  The EF was approximately 50% with mild global hypokinesis.  DISTAL AORTOGRAM:  A distal aortogram was obtained secondary to the patients refractory hypertension.  There was no significant atherosclerotic disease. In particular, the right renal artery was free of high-grade disease with an ostial 30% stenosis; the left renal artery had luminal irregularities.  CONCLUSION: 1. Mildly reduced left ventricular function. 2. Mildly elevated pulmonary capillary wedge pressure and pulmonary artery    pressure.  Cardiac output and index are lower than I expected based on    the left ventriculogram. 3. Coronary artery disease is moderate, although it could be getting ischemic    in the diagonal or circumflex  distribution.  PLAN:  The patient will have adenosine Cardiolite to look further for ischemia in the distribution of the first obtuse marginal or the second diagonal.  I think this is unlikely.  She will have an echocardiogram to further quantify her LV function and in particular to look at her LV filling for diastolic dysfunction as well to measure the degree of ventricular hypertrophy. Dictated by:   Rollene Rotunda, M.D. [facility] Attending Physician:  Marcelino Duster D DD:  07/09/01 TD:  07/09/01 Job: [id number]

## 2010-11-19 NOTE — Op Note (Signed)
[name], [name]             ACCOUNT NO.:  [id number]   MEDICAL RECORD NO.:  [id number]          PATIENT TYPE:  AMB   LOCATION:  SDS                          FACILITY:  [facility]   PHYSICIAN:  Salvadore Farber, MD  DATE OF BIRTH:  08/27/1945   DATE OF PROCEDURE:  09/27/2006  DATE OF DISCHARGE:                               OPERATIVE REPORT   PROCEDURES:  1. Abdominal aortography with bilateral lower extremity runoff.  2. Selective catheterization of the right popliteal artery via left      common femoral arterial access.  3. Stenting of two lesions in the right superficial femoral artery.   INDICATIONS:  [name] is a 65 year old lady who has had longstanding  peripheral arterial disease.  She presents with very short-segment  claudication which is substantially worse over the past 3 months.  She  has also developed ulcerations on her right great toe which have been  very slow to heal.  She presents for angiography with an eye to  revascularization.   PROCEDURAL TECHNIQUE:  Informed consent was obtained.  Under 1%  lidocaine local anesthesia, a 5-French sheath was placed in the left  common femoral artery using the modified Seldinger technique.  A pigtail  catheter was advanced to the suprarenal abdominal aorta.  Abdominal  aortography was performed by power injection.  The catheter was then  pulled back to the infrarenal abdominal aorta.  Abdominal aortography  with bilateral lower extremity angiography was performed using digital  subtraction and step-table technique.  I then used a Crossover catheter  to engage the right common iliac artery.  I advanced a Wholey wire into  the right common femoral.  I then exchanged for a 6 French Terumo glide  sheath, which I advanced into the right common femoral artery over the  [facility] wire.  I then performed additional angiography via the sheath.   We decided to proceed to percutaneous revascularization of the two  stenoses within  the right superficial femoral artery.  I initially tried  using a stiff angled Glidewire with 5-French end-hole catheter for  backup.  We were unable to penetrate the proximal cap.  I then  reattempted with a Whisper wire but again was unable to penetrate the  proximal cap.  I then made another attempt with the stiff angled  Glidewire and was able to penetrate the proximal cap.  Wire returned to  the true lumen immediately.  I advanced it across the diseased segment  into the below-the-knee popliteal.  I then advanced the end-hole  catheter over this and exchanged for a Wholey wire.  Intraluminal  position was confirmed by angiography.  I then proceeded to balloon  angioplasty of the distal SFA lesion using a 5 x 80 mm Powerflex for two  inflations at 6 and 10 atmospheres.  I then stented this distal lesion  using a 7 x 120 mm Protege self-expanding stent.  I postdilated this  stent using a 5 x 80 mm Powerflex for three at 10 atmospheres.  I then  repeated angiography of the below-the-knee vessels, which demonstrated  improvement in distal flow  with no evidence of embolization.  I then  assessed the lesion of the proximal SFA, pressure pullback after the  administration of intra-arterial nitroglycerin.  This demonstrated a  greater than 20 mm translesional gradient.  I therefore proceeded to  stenting using a 7 x 40 mm Protege stent.  I postdilated this stent  using a 5 x 40 mm Powerflex at 8 atmospheres.  Final angiography  demonstrated 10% residual stenosis in the proximal lesion and 10%  residual stenosis in the distal lesion with excellent flow, no  dissection, and no evidence of embolization.  The patient tolerated the  procedure well and was transferred to the holding room in stable  condition.  Sheaths will be removed there.   COMPLICATIONS:  None.   FINDINGS:  1. Abdominal aorta:  No stenosis or aneurysm.  2. Renal arteries:  There is a single left renal artery which is       normal.  There are two right renal arteries, both of which are      normal.  3. Right leg:  The common iliac has a proximal 60% stenosis which has      30 mm translesional gradient as assessed via 6-French catheter      after the administration of nitroglycerin.  The internal iliac is      patent.  The external iliac and common femoral are normal.  The      profunda has a focal 90% stenosis in a large branch.  The SFA had      an 80% stenosis proximally and total occlusion distally.  These      were both stented to 10% residual.  The popliteal is normal.  The      anterior tibial is occluded along its course.  There is excellent      runoff via a large peroneal.  The posterior tibial is diffusely      diseased with a short-segment occlusion just below the ankle.  4. Left leg:  Normal common iliac, internal iliac, external iliac,      common femoral, and profunda.  The SFA is diffusely diseased with      focal 60, 80, and 100% stenoses.  The total occlusion appears to be      very short in the distal SFA.  The distal popliteal has a stenosis      of questionable severity.  The anterior tibial is totally occluded      along its course.  There is two-vessel runoff to the foot.   IMPRESSION/RECOMMENDATIONS:  Successful revascularization of the right  superficial femoral artery using two self-expanding stents.  Plan will  be for her to return for revascularization of the left leg next week.      Salvadore Farber, MD  Electronically Signed     WED/MEDQ  D:  09/27/2006  T:  09/27/2006  Job:  [id number]

## 2010-12-09 ENCOUNTER — Encounter: Payer: Self-pay | Admitting: Cardiovascular Disease

## 2011-01-11 ENCOUNTER — Ambulatory Visit: Payer: Medicare Other | Admitting: Vascular Surgery

## 2011-01-13 ENCOUNTER — Encounter: Payer: Self-pay | Admitting: Cardiovascular Disease

## 2011-01-18 ENCOUNTER — Ambulatory Visit (INDEPENDENT_AMBULATORY_CARE_PROVIDER_SITE_OTHER): Payer: Medicare Other | Admitting: Cardiovascular Disease

## 2011-01-18 ENCOUNTER — Encounter: Payer: Self-pay | Admitting: Cardiovascular Disease

## 2011-01-18 VITALS — BP 137/63 | HR 88 | Resp 14 | Ht 64.0 in | Wt 165.0 lb

## 2011-01-18 DIAGNOSIS — I2510 Atherosclerotic heart disease of native coronary artery without angina pectoris: Secondary | ICD-10-CM

## 2011-01-18 DIAGNOSIS — I739 Peripheral vascular disease, unspecified: Secondary | ICD-10-CM

## 2011-01-18 DIAGNOSIS — I10 Essential (primary) hypertension: Secondary | ICD-10-CM

## 2011-01-18 NOTE — Progress Notes (Signed)
HPI:  This is a 65 year old woman presenting for followup evaluation. He has extensive coronary and vascular disease. She has undergone prior PCI of the left circumflex with a drug-eluting stent platform. She has had carotid endarterectomy and multiple leg revascularization procedures. Her legs been treated with both stenting and several bypasses. Her most recent bypass was a right leg above knee to below knee popliteal bypass for treatment of critical limb ischemia back in March of this year. Overall she is doing well at present. She denies rest pain in her legs. She has stable claudication symptoms. She denies chest pain or pressure. She denies palpitations, lightheadedness, or syncope.  Outpatient Encounter Prescriptions as of 01/18/2011  Medication Sig Dispense Refill  . ALPRAZolam (XANAX) 0.25 MG tablet Take 0.25 mg by mouth daily.        Marland Kitchen aspirin 81 MG tablet Take 81 mg by mouth daily.        . calcium carbonate (TUMS) 500 MG chewable tablet Chew by mouth as needed.        . clopidogrel (PLAVIX) 75 MG tablet Take 75 mg by mouth daily.        Marland Kitchen dexlansoprazole (DEXILANT) 60 MG capsule Take 60 mg by mouth 2 (two) times daily.        Marland Kitchen glyBURIDE (DIABETA) 2.5 MG tablet Take 2.5 mg by mouth daily.        . hydrochlorothiazide 25 MG tablet Take 25 mg by mouth daily.        . iron polysaccharides (NIFEREX) 150 MG capsule Take 150 mg by mouth 2 (two) times daily.        . isosorbide mononitrate (IMDUR) 30 MG 24 hr tablet Take 1/2 tablet by mouth every morning and every evening       . metoprolol succinate (TOPROL-XL) 25 MG 24 hr tablet Take 25 mg by mouth daily.        . nitroGLYCERIN (NITROSTAT) 0.4 MG SL tablet Place 0.4 mg under the tongue every 5 (five) minutes as needed.        Marland Kitchen omega-3 acid ethyl esters (LOVAZA) 1 G capsule Take 2 g by mouth 2 (two) times daily.        . pioglitazone-metformin (ACTOPLUS MET) 15-500 MG per tablet Take 1 tablet by mouth daily.        . ramipril (ALTACE) 2.5 MG  capsule Take 2.5 mg by mouth daily.        . sitaGLIPtan (JANUVIA) 100 MG tablet Take 100 mg by mouth daily.        Marland Kitchen DISCONTD: Homeopathic Products (FERRUM HOMACCORD PO) Take 1 tablet by mouth 2 (two) times daily.          Allergies  Allergen Reactions  . Acetaminophen   . Codeine   . Erythromycin   . Propoxyphene Hcl   . Statins     Past Medical History  Diagnosis Date  . Hypertension     Unspecified  . Coronary artery disease     a. s/p NSTEMI 02/2009 - PCI LCX with Xience DES. Otherwise branch vessel and Dist. RCA dzs. NL EF.   Marland Kitchen Hyperlipidemia     Mixed  . Diabetes mellitus   . Tobacco abuse     Remote  . Peripheral vascular disease     unspecified, a. s/p L CEA b. s/p B fem-pop bypass    ROS: Negative except as per HPI  BP 137/63  Pulse 88  Resp 14  Ht 5\' 4"  (1.626 m)  Wt 165 lb (74.844 kg)  BMI 28.32 kg/m2  PHYSICAL EXAM: Pt is alert and oriented, NAD HEENT: normal Neck: JVP - normal, carotids 2+= with bilateral bruits Lungs: CTA bilaterally CV: RRR without murmur or gallop Abd: soft, NT, Positive BS, no hepatomegaly Ext: no C/C/E Skin: warm/dry no rash  EKG:  Normal Sinus 88 bpm, nonspecific T wave abnormality.  ASSESSMENT AND PLAN:

## 2011-01-18 NOTE — Patient Instructions (Signed)
Your physician wants you to follow-up in: 1 year with Dr. Cooper.  You will receive a reminder letter in the mail two months in advance. If you don't receive a letter, please call our office to schedule the follow-up appointment.  

## 2011-02-02 ENCOUNTER — Encounter (HOSPITAL_COMMUNITY): Payer: Medicare Other

## 2011-02-07 ENCOUNTER — Encounter: Payer: Self-pay | Admitting: Cardiovascular Disease

## 2011-02-07 NOTE — Assessment & Plan Note (Signed)
The patient is stable without angina. Continue dual antiplatelet therapy with aspirin and Plavix. She is on a good risk reduction program with the use of an ACE inhibitor and beta blocker. The patient is statin intolerant and has been tried on multiple agents.

## 2011-02-07 NOTE — Assessment & Plan Note (Signed)
Appears stable. Followed closely by Dr. Arbie Cookey.

## 2011-02-07 NOTE — Assessment & Plan Note (Signed)
Controlled with current medical therapy.

## 2011-02-08 ENCOUNTER — Ambulatory Visit (INDEPENDENT_AMBULATORY_CARE_PROVIDER_SITE_OTHER): Payer: Medicare Other | Admitting: Vascular Surgery

## 2011-02-08 ENCOUNTER — Encounter (INDEPENDENT_AMBULATORY_CARE_PROVIDER_SITE_OTHER): Payer: Medicare Other

## 2011-02-08 ENCOUNTER — Other Ambulatory Visit (INDEPENDENT_AMBULATORY_CARE_PROVIDER_SITE_OTHER): Payer: Medicare Other

## 2011-02-08 ENCOUNTER — Encounter: Payer: Self-pay | Admitting: Vascular Surgery

## 2011-02-08 VITALS — BP 143/69 | HR 87 | Resp 20 | Ht 64.0 in | Wt 164.3 lb

## 2011-02-08 DIAGNOSIS — I739 Peripheral vascular disease, unspecified: Secondary | ICD-10-CM

## 2011-02-08 DIAGNOSIS — L98499 Non-pressure chronic ulcer of skin of other sites with unspecified severity: Secondary | ICD-10-CM

## 2011-02-08 DIAGNOSIS — Z48812 Encounter for surgical aftercare following surgery on the circulatory system: Secondary | ICD-10-CM

## 2011-02-08 DIAGNOSIS — I70219 Atherosclerosis of native arteries of extremities with intermittent claudication, unspecified extremity: Secondary | ICD-10-CM

## 2011-02-08 DIAGNOSIS — I6529 Occlusion and stenosis of unspecified carotid artery: Secondary | ICD-10-CM

## 2011-02-08 NOTE — Progress Notes (Signed)
Subjective:     Patient ID: [name], female   DOB: May 11, 1946, 65 y.o.   MRN: [id number]  HPI The patient presents today for followup of her left carotid endarterectomy from 2005 and multiple iliac stents bilateral and lower surety bypasses. She looks quite good today and reports that her general health has been very good. She does report bilateral claudication on walking quickly or upstairs. She has no tissue loss. She has no new cardiac difficulties. She does denies any strokes or TIA.s. Past Medical History  Diagnosis Date  . Hypertension     Unspecified  . Coronary artery disease     a. s/p NSTEMI 02/2009 - PCI LCX with Xience DES. Otherwise branch vessel and Dist. RCA dzs. NL EF.   Marland Kitchen Hyperlipidemia     Mixed  . Diabetes mellitus   . Tobacco abuse     Remote  . Peripheral vascular disease     unspecified, a. s/p L CEA b. s/p B fem-pop bypass    History  Substance Use Topics  . Smoking status: Former Smoker    Quit date: 07/05/2001  . Smokeless tobacco: Not on file  . Alcohol Use: 7.0 oz/week    14 drink(s) per week    Family History  Problem Relation Age of Onset  . Heart attack Mother   . Heart attack Father   . Hypertension Brother   . Diabetes Brother   . Hypertension Brother     Allergies  Allergen Reactions  . Acetaminophen   . Codeine   . Erythromycin   . Hydromorphone   . Ibuprofen   . Propoxyphene Hcl   . Statins     Current outpatient prescriptions:ALPRAZolam (XANAX) 0.25 MG tablet, Take 0.25 mg by mouth daily.  , Disp: , Rfl: ;  aspirin 81 MG tablet, Take 81 mg by mouth daily. , Disp: , Rfl: ;  calcium carbonate (TUMS) 500 MG chewable tablet, Chew by mouth as needed.  , Disp: , Rfl: ;  clopidogrel (PLAVIX) 75 MG tablet, Take 75 mg by mouth daily.  , Disp: , Rfl:  dexlansoprazole (DEXILANT) 60 MG capsule, Take 60 mg by mouth 2 (two) times daily.  , Disp: , Rfl: ;  glyBURIDE (DIABETA) 2.5 MG tablet, Take 2.5 mg by mouth daily.  , Disp: , Rfl: ;   hydrochlorothiazide 25 MG tablet, Take 25 mg by mouth daily.  , Disp: , Rfl: ;  iron polysaccharides (NIFEREX) 150 MG capsule, Take 150 mg by mouth 2 (two) times daily.  , Disp: , Rfl:  isosorbide mononitrate (IMDUR) 30 MG 24 hr tablet, Take 1/2 tablet by mouth every morning and every evening , Disp: , Rfl: ;  metoprolol succinate (TOPROL-XL) 25 MG 24 hr tablet, Take 25 mg by mouth daily.  , Disp: , Rfl: ;  nitroGLYCERIN (NITROSTAT) 0.4 MG SL tablet, Place 0.4 mg under the tongue every 5 (five) minutes as needed.  , Disp: , Rfl:  omega-3 acid ethyl esters (LOVAZA) 1 G capsule, Take 2 g by mouth 2 (two) times daily.  , Disp: , Rfl: ;  pioglitazone-metformin (ACTOPLUS MET) 15-500 MG per tablet, Take 1 tablet by mouth daily.  , Disp: , Rfl: ;  ramipril (ALTACE) 2.5 MG capsule, Take 2.5 mg by mouth daily.  , Disp: , Rfl: ;  sitaGLIPtan (JANUVIA) 100 MG tablet, Take 100 mg by mouth daily.  , Disp: , Rfl:   Filed Vitals:   02/08/11 1229  Height: 5\' 4"  (1.626 m)  Weight: 164 lb 4.8 oz (74.526 kg)    Body mass index is 28.20 kg/(m^2).       Review of Systems    no chain Objective:   Physical Exam    well-developed well-nourished white female in no acute distress. Well healed left carotid incision. Both feet are well perfused with no tissue loss. Palpable bilateral femoral pulses with absent distal pulses.  Carotid duplex: Widely patent left endarterectomy and moderate 60-79% stenosis and right internal carotid artery  Lower extremity Doppler: Duplex showing the iliac artery stenoses bilaterally, patent bilateral femoral-popliteal bypasses. Assessment:     Stable carotid artery disease. Evidence of recurrent iliac stenoses.    Plan:     The patient has a minimal lower extremity claudication symptoms. She wishes to continue observation only and will be seen in our office for a followup duplex in 3 months

## 2011-02-21 NOTE — Procedures (Unsigned)
AORTA-ILIAC DUPLEX EVALUATION  INDICATION:  Bilateral common iliac and left external iliac artery stents.  HISTORY: Diabetes:  Yes. Cardiac:  No. Hypertension:  Yes. Smoking:  Previous. Previous Surgery:  Bilateral common iliac, right external iliac, and right popliteal artery stents on 03/30/2010, re-do of right fem-pop bypass graft on 10/30/2007, left fem-pop bypass graft on 07/09/2007, left femoral endarterectomy/angioplasty on 07/13/2010, right fem-pop bypass graft on 09/06/2010.              SINGLE LEVEL ARTERIAL EXAM                             RIGHT                  LEFT Brachial:                  108                    82 Anterior tibial:           Not detected           Not detected Posterior tibial:          36                     91 Peroneal:                  46                     80 Ankle/brachial index:      0.43                   0.81 Previous ABI/date:         09/28/2010, 0.65       09/28/2010, 0.99  AORTA-ILIAC DUPLEX EXAM Aorta - Proximal     81 cm/s Aorta - Mid          60 cm/s Aorta - Distal       92 cm/s  RIGHT                                   LEFT 105 cm/s          CIA-PROXIMAL          167 cm/s 91 cm/s           CIA-DISTAL            179 cm/s Not visualized    HYPOGASTRIC           Not visualized 112 cm/s          EIA-PROXIMAL          193 cm/s 108 cm/s          EIA-MID               149 cm/s 466 cm/s          EIA-DISTAL            176 cm/s  IMPRESSION: 1. Patent bilateral common iliac and left external iliac artery stents     with a velocity of 466 cm/s noted in the right distal external     iliac artery.  Velocities of 264 cm/s and 331 cm/s noted in the     right and left common femoral arteries, respectively. 2. Bilateral ankle brachial indices and  Doppler waveforms suggest     moderate-to-severely decreased perfusion of the right lower     extremity and mildly decreased perfusion of the left lower     extremity.   Significant decrease of the bilateral ankle brachial     indices noted when compared to the previous examination.  ___________________________________________ Larina Earthly, M.D.  CH/MEDQ  D:  02/09/2011  T:  02/09/2011  Job:  [id number]

## 2011-02-21 NOTE — Procedures (Unsigned)
CAROTID DUPLEX EXAM  INDICATION:  Right carotid endarterectomy.  HISTORY: Diabetes:  Yes. Cardiac:  No. Hypertension:  Yes. Smoking:  Previous. Previous Surgery:  Left carotid endarterectomy on 09/02/2003. CV History:  Currently asymptomatic. Amaurosis Fugax No, Paresthesias No, Hemiparesis No.                                      RIGHT             LEFT Brachial systolic pressure:         108               82 Brachial Doppler waveforms:         Normal            Abnormal Vertebral direction of flow:        Antegrade         Not visualized DUPLEX VELOCITIES (cm/sec) CCA peak systolic                   156               82 ECA peak systolic                   287               191 ICA peak systolic                   207               123 ICA end diastolic                   70                36 PLAQUE MORPHOLOGY:                  Mixed             Heterogenous PLAQUE AMOUNT:                      Moderate          Mild PLAQUE LOCATION:                    ICA/ECA/CCA       CCA/ECA  IMPRESSION: 1. Doppler velocities suggest a 60% to 79% stenosis of the right     proximal internal carotid artery. 2. Patent left carotid endarterectomy site with no evidence of left     internal carotid artery stenosis. 3. Monophasic flow is noted in the left subclavian artery, which is     suggestive of a more proximal stenosis.  The left vertebral artery     was not adequately visualized. 4. Significant difference in the bilateral brachial artery pressures     noted. 5. No significant change in the bilateral internal carotid arteries     noted when compared to the previous examination on 02/12/2010.  ___________________________________________ Larina Earthly, M.D.  CH/MEDQ  D:  02/09/2011  T:  02/09/2011  Job:  [id number]

## 2011-02-21 NOTE — Procedures (Unsigned)
BYPASS GRAFT EVALUATION  INDICATION:  Bilateral lower extremity bypass grafts.  HISTORY: Diabetes:  Yes. Cardiac:  No. Hypertension:  Yes. Smoking:  Previous. Previous Surgery:  Bilateral common iliac, right external iliac, and right popliteal artery stents on 03/30/2010.  Re-do of right fem-pop bypass graft on 10/30/2007, left fem-pop bypass graft on 07/09/2007, a left femoral endarterectomy/angioplasty on 07/13/2010, right fem-pop bypass graft on 09/06/2010.  SINGLE LEVEL ARTERIAL EXAM                              RIGHT              LEFT Brachial:                    108                82 Anterior tibial:             Not detected       Not detected Posterior tibial:            36                 91 Peroneal:                    46                 80 Ankle/brachial index:        0.43               0.84  PREVIOUS ABI:  Date: 09/28/2010  RIGHT:  0.65  LEFT:  0.99  LOWER EXTREMITY BYPASS GRAFT DUPLEX EXAM:  DUPLEX: 1. Monophasic Doppler waveforms noted throughout the right lower     extremity bypass graft with velocities of 264 cm/s and 466 cm/s     noted in the right common femoral and distal external iliac     arteries respectively. 2. Biphasic Doppler waveforms noted throughout the left lower     extremity bypass graft with a velocity of 331 cm/s noted in the     left common femoral artery.  IMPRESSION: 1. Patent bilateral femoropopliteal bypass grafts with no evidence of     stenosis. 2. Elevated velocities of the bilateral common femoral arteries noted,     as described above. 3. The bilateral ankle brachial indices and Doppler waveforms suggest     moderate-to-severely decreased perfusion of the right lower     extremity and mildly decreased perfusion of the left lower     extremity. Significant decrease of the bilateral ankle brachial     indices noted when compared to the previous  examination.  ___________________________________________ Larina Earthly, M.D.  CH/MEDQ  D:  02/09/2011  T:  02/09/2011  Job:  [id number]

## 2011-03-24 LAB — TYPE AND SCREEN
ABO/RH(D): O POS
Antibody Screen: NEGATIVE

## 2011-03-29 LAB — COMPREHENSIVE METABOLIC PANEL
Albumin: 3.7
Alkaline Phosphatase: 52
BUN: 22
Creatinine, Ser: 1.14
Glucose, Bld: 138 — ABNORMAL HIGH
Total Bilirubin: 0.6
Total Protein: 7

## 2011-03-29 LAB — URINALYSIS, ROUTINE W REFLEX MICROSCOPIC
Bilirubin Urine: NEGATIVE
Hgb urine dipstick: NEGATIVE
Ketones, ur: NEGATIVE
Nitrite: NEGATIVE
Specific Gravity, Urine: 1.013
pH: 6

## 2011-03-29 LAB — APTT: aPTT: 29

## 2011-03-29 LAB — CBC
HCT: 38.1
Hemoglobin: 12.7
MCV: 85.7
Platelets: 379
RDW: 14.3

## 2011-03-29 LAB — TYPE AND SCREEN: Antibody Screen: NEGATIVE

## 2011-03-29 LAB — PROTIME-INR
INR: 1
Prothrombin Time: 13

## 2011-04-06 LAB — TISSUE CULTURE

## 2011-04-06 LAB — ANAEROBIC CULTURE: Gram Stain: NONE SEEN

## 2011-04-08 LAB — COMPREHENSIVE METABOLIC PANEL
AST: 16
CO2: 26
Chloride: 97
Creatinine, Ser: 0.74
GFR calc Af Amer: >60
GFR calc non Af Amer: >60
Glucose, Bld: 139 — ABNORMAL HIGH
Total Bilirubin: 0.3

## 2011-04-08 LAB — URINALYSIS, ROUTINE W REFLEX MICROSCOPIC
Bilirubin Urine: NEGATIVE
Glucose, UA: NEGATIVE
Hgb urine dipstick: NEGATIVE
Specific Gravity, Urine: 1.021

## 2011-04-08 LAB — BLOOD GAS, ARTERIAL
Acid-Base Excess: 2.8 — ABNORMAL HIGH
Bicarbonate: 26.5 — ABNORMAL HIGH
TCO2: 27.6
pCO2 arterial: 37.9
pH, Arterial: 7.458 — ABNORMAL HIGH
pO2, Arterial: 93.4

## 2011-04-08 LAB — PROTIME-INR: Prothrombin Time: 12.8

## 2011-04-08 LAB — CBC
HCT: 37.7
Hemoglobin: 12.8
MCHC: 33.9
MCV: 87.1
RBC: 4.33
WBC: 6.5

## 2011-04-08 LAB — URINE MICROSCOPIC-ADD ON

## 2011-04-11 LAB — CBC
HCT: 35.1 — ABNORMAL LOW
MCHC: 34
MCV: 89.2
Platelets: 289
RDW: 14.7
WBC: 5.9

## 2011-04-11 LAB — TYPE AND SCREEN
ABO/RH(D): O POS
Antibody Screen: NEGATIVE

## 2011-04-11 LAB — BASIC METABOLIC PANEL
BUN: 7
CO2: 27
Chloride: 99
Creatinine, Ser: 0.78
Glucose, Bld: 139 — ABNORMAL HIGH
Potassium: 4.7

## 2011-04-12 LAB — COMPREHENSIVE METABOLIC PANEL
AST: 18
Albumin: 3.5
Alkaline Phosphatase: 86
BUN: 17
GFR calc Af Amer: >60
Potassium: 3.5
Total Protein: 6.7

## 2011-04-12 LAB — URINALYSIS, ROUTINE W REFLEX MICROSCOPIC
Hgb urine dipstick: NEGATIVE
Nitrite: NEGATIVE
Specific Gravity, Urine: 1.025
Urobilinogen, UA: 0.2

## 2011-04-12 LAB — CBC
HCT: 39.7
Platelets: 320
RDW: 14.1

## 2011-04-12 LAB — PROTIME-INR: INR: 0.9

## 2011-04-12 LAB — URINE MICROSCOPIC-ADD ON

## 2011-04-12 LAB — APTT: aPTT: 30

## 2011-04-29 ENCOUNTER — Encounter (HOSPITAL_COMMUNITY): Payer: Medicare Other | Attending: Endocrinology

## 2011-04-29 DIAGNOSIS — D649 Anemia, unspecified: Secondary | ICD-10-CM | POA: Insufficient documentation

## 2011-05-03 ENCOUNTER — Encounter (HOSPITAL_COMMUNITY): Payer: Medicare Other

## 2011-05-12 ENCOUNTER — Ambulatory Visit (INDEPENDENT_AMBULATORY_CARE_PROVIDER_SITE_OTHER): Payer: Medicare Other | Admitting: Vascular Surgery

## 2011-05-12 ENCOUNTER — Other Ambulatory Visit: Payer: Self-pay | Admitting: *Deleted

## 2011-05-12 ENCOUNTER — Encounter: Payer: Self-pay | Admitting: Vascular Surgery

## 2011-05-12 ENCOUNTER — Encounter (INDEPENDENT_AMBULATORY_CARE_PROVIDER_SITE_OTHER): Payer: Medicare Other | Admitting: *Deleted

## 2011-05-12 ENCOUNTER — Other Ambulatory Visit (INDEPENDENT_AMBULATORY_CARE_PROVIDER_SITE_OTHER): Payer: Medicare Other | Admitting: *Deleted

## 2011-05-12 VITALS — BP 120/69 | HR 80 | Resp 18 | Ht 64.0 in | Wt 163.0 lb

## 2011-05-12 DIAGNOSIS — Z48812 Encounter for surgical aftercare following surgery on the circulatory system: Secondary | ICD-10-CM

## 2011-05-12 DIAGNOSIS — I739 Peripheral vascular disease, unspecified: Secondary | ICD-10-CM

## 2011-05-12 DIAGNOSIS — I70219 Atherosclerosis of native arteries of extremities with intermittent claudication, unspecified extremity: Secondary | ICD-10-CM

## 2011-05-12 NOTE — Progress Notes (Signed)
The patient presents today for followup of her extensive diffuse peripheral vascular occlusive disease. He said that recent difficulty with iron deficiency anemia and is having iron infusions. She does have a small open ulceration over her great toe. He has not had any rest pain and has stable claudication.  Past Medical History  Diagnosis Date  . Hypertension     Unspecified  . Coronary artery disease     a. s/p NSTEMI 02/2009 - PCI LCX with Xience DES. Otherwise branch vessel and Dist. RCA dzs. NL EF.   . Hyperlipidemia     Mixed  . Diabetes mellitus   . Tobacco abuse     Remote  . Peripheral vascular disease     unspecified, a. s/p L CEA b. s/p B fem-pop bypass    History  Substance Use Topics  . Smoking status: Former Smoker    Types: Cigarettes    Quit date: 07/05/2001  . Smokeless tobacco: Not on file  . Alcohol Use: 7.0 oz/week    14 drink(s) per week    Family History  Problem Relation Age of Onset  . Heart attack Mother   . Heart attack Father   . Hypertension Brother   . Diabetes Brother   . Hypertension Brother     Allergies  Allergen Reactions  . Acetaminophen   . Codeine   . Erythromycin   . Hydromorphone   . Ibuprofen   . Propoxyphene Hcl   . Statins     Current outpatient prescriptions:ALPRAZolam (XANAX) 0.25 MG tablet, Take 0.25 mg by mouth daily.  , Disp: , Rfl: ;  aspirin 81 MG tablet, Take 81 mg by mouth daily. , Disp: , Rfl: ;  calcium carbonate (TUMS) 500 MG chewable tablet, Chew by mouth as needed.  , Disp: , Rfl: ;  clopidogrel (PLAVIX) 75 MG tablet, Take 75 mg by mouth daily.  , Disp: , Rfl:  dexlansoprazole (DEXILANT) 60 MG capsule, Take 60 mg by mouth 2 (two) times daily.  , Disp: , Rfl: ;  glyBURIDE (DIABETA) 2.5 MG tablet, Take 2.5 mg by mouth daily.  , Disp: , Rfl: ;  hydrochlorothiazide 25 MG tablet, Take 25 mg by mouth daily.  , Disp: , Rfl: ;  iron polysaccharides (NIFEREX) 150 MG capsule, Take 150 mg by mouth 2 (two) times daily.  ,  Disp: , Rfl:  isosorbide mononitrate (IMDUR) 30 MG 24 hr tablet, Take 1/2 tablet by mouth every morning and every evening , Disp: , Rfl: ;  metoprolol succinate (TOPROL-XL) 25 MG 24 hr tablet, Take 25 mg by mouth daily.  , Disp: , Rfl: ;  nitroGLYCERIN (NITROSTAT) 0.4 MG SL tablet, Place 0.4 mg under the tongue every 5 (five) minutes as needed.  , Disp: , Rfl:  omega-3 acid ethyl esters (LOVAZA) 1 G capsule, Take 2 g by mouth 2 (two) times daily.  , Disp: , Rfl: ;  pioglitazone-metformin (ACTOPLUS MET) 15-500 MG per tablet, Take 1 tablet by mouth daily.  , Disp: , Rfl: ;  ramipril (ALTACE) 2.5 MG capsule, Take 2.5 mg by mouth daily.  , Disp: , Rfl: ;  sitaGLIPtan (JANUVIA) 100 MG tablet, Take 100 mg by mouth daily.  , Disp: , Rfl:   BP 120/69  Pulse 80  Resp 18  Ht 5' 4" (1.626 m)  Wt 163 lb (73.936 kg)  BMI 27.98 kg/m2  Body mass index is 27.98 kg/(m^2).       Physical exam well-developed white female no acute   distress. HEENT normal. She has absent distal pulsation bilaterally. She does have a small clean ulcer on her right great toe.  Noninvasive vascular lab. Drop in her ankle arm index on the right 0.26 down from 0.43 in August of 2012. Stable left ankle arm index at 0.84. She had graft imaging and this revealed patency of her right femoropopliteal bypass but extensive narrowing in her iliac system.  Impression and plan: Aggressive ischemia in her right foot. I have recommended arteriography for further evaluation. She has known iliac disease and has undergone prior iliac stenting. If she has lesions amenable to restenting this would be accomplished if not we would recommend consideration of aortofemoral bypass. She is scheduled for outpatient arteriogram on November 13. 

## 2011-05-13 ENCOUNTER — Encounter (HOSPITAL_COMMUNITY): Payer: Self-pay | Admitting: Pharmacy Technician

## 2011-05-16 MED ORDER — SODIUM CHLORIDE 0.9 % IV SOLN
INTRAVENOUS | Status: DC
  Administered 2011-05-17: 11:00:00 via INTRAVENOUS

## 2011-05-17 ENCOUNTER — Ambulatory Visit (HOSPITAL_COMMUNITY)
Admission: RE | Admit: 2011-05-17 | Discharge: 2011-05-17 | Disposition: A | Discharge status: Home / Self Care | Payer: Medicare Other | Source: Ambulatory Visit | Attending: Surgery | Admitting: Surgery

## 2011-05-17 ENCOUNTER — Encounter (HOSPITAL_COMMUNITY): Payer: Self-pay

## 2011-05-17 ENCOUNTER — Encounter (HOSPITAL_COMMUNITY)
Admission: RE | Disposition: A | Discharge status: Home / Self Care | Payer: Self-pay | Source: Ambulatory Visit | Attending: Surgery

## 2011-05-17 DIAGNOSIS — I739 Peripheral vascular disease, unspecified: Secondary | ICD-10-CM

## 2011-05-17 DIAGNOSIS — L98499 Non-pressure chronic ulcer of skin of other sites with unspecified severity: Secondary | ICD-10-CM

## 2011-05-17 HISTORY — PX: PERCUTANEOUS STENT INTERVENTION: SHX5500

## 2011-05-17 HISTORY — PX: ABDOMINAL AORTAGRAM: SHX5454

## 2011-05-17 HISTORY — PX: LOWER EXTREMITY ANGIOGRAM: SHX5508

## 2011-05-17 SURGERY — ABDOMINAL AORTAGRAM
Anesthesia: LOCAL | Laterality: Right

## 2011-05-17 MED ORDER — MIDAZOLAM HCL 2 MG/2ML IJ SOLN
INTRAMUSCULAR | Status: AC
  Filled 2011-05-17: qty 2

## 2011-05-17 MED ORDER — FENTANYL CITRATE 0.05 MG/ML IJ SOLN
25.0000 ug | INTRAMUSCULAR | Status: DC | PRN
  Administered 2011-05-17: 25 ug via INTRAVENOUS

## 2011-05-17 MED ORDER — FENTANYL CITRATE 0.05 MG/ML IJ SOLN
INTRAMUSCULAR | Status: AC
Start: 2011-05-17 — End: 2011-05-17
  Filled 2011-05-17: qty 2

## 2011-05-17 MED ORDER — HEPARIN (PORCINE) IN NACL 2-0.9 UNIT/ML-% IJ SOLN
INTRAMUSCULAR | Status: AC
  Filled 2011-05-17: qty 1000

## 2011-05-17 MED ORDER — HEPARIN SODIUM (PORCINE) 1000 UNIT/ML IJ SOLN
INTRAMUSCULAR | Status: AC
  Filled 2011-05-17: qty 1

## 2011-05-17 MED ORDER — FENTANYL CITRATE 0.05 MG/ML IJ SOLN
INTRAMUSCULAR | Status: AC
Start: 2011-05-17 — End: 2011-05-17
  Administered 2011-05-17: 25 ug via INTRAVENOUS
  Filled 2011-05-17: qty 2

## 2011-05-17 MED ORDER — LIDOCAINE HCL (PF) 1 % IJ SOLN
INTRAMUSCULAR | Status: AC
  Filled 2011-05-17: qty 30

## 2011-05-17 MED ORDER — SODIUM CHLORIDE 0.9 % IV SOLN: 1.0000 mL/kg/h | INTRAVENOUS | Status: AC

## 2011-05-17 MED ORDER — ONDANSETRON HCL 4 MG/2ML IJ SOLN: 4.0000 mg | Freq: Four times a day (QID) | INTRAMUSCULAR | Status: DC | PRN

## 2011-05-17 NOTE — H&P (View-Only) (Signed)
The patient presents today for followup of her extensive diffuse peripheral vascular occlusive disease. He said that recent difficulty with iron deficiency anemia and is having iron infusions. She does have a small open ulceration over her great toe. He has not had any rest pain and has stable claudication.  Past Medical History  Diagnosis Date  . Hypertension     Unspecified  . Coronary artery disease     a. s/p NSTEMI 02/2009 - PCI LCX with Xience DES. Otherwise branch vessel and Dist. RCA dzs. NL EF.   Marland Kitchen Hyperlipidemia     Mixed  . Diabetes mellitus   . Tobacco abuse     Remote  . Peripheral vascular disease     unspecified, a. s/p L CEA b. s/p B fem-pop bypass    History  Substance Use Topics  . Smoking status: Former Smoker    Types: Cigarettes    Quit date: 07/05/2001  . Smokeless tobacco: Not on file  . Alcohol Use: 7.0 oz/week    14 drink(s) per week    Family History  Problem Relation Age of Onset  . Heart attack Mother   . Heart attack Father   . Hypertension Brother   . Diabetes Brother   . Hypertension Brother     Allergies  Allergen Reactions  . Acetaminophen   . Codeine   . Erythromycin   . Hydromorphone   . Ibuprofen   . Propoxyphene Hcl   . Statins     Current outpatient prescriptions:ALPRAZolam (XANAX) 0.25 MG tablet, Take 0.25 mg by mouth daily.  , Disp: , Rfl: ;  aspirin 81 MG tablet, Take 81 mg by mouth daily. , Disp: , Rfl: ;  calcium carbonate (TUMS) 500 MG chewable tablet, Chew by mouth as needed.  , Disp: , Rfl: ;  clopidogrel (PLAVIX) 75 MG tablet, Take 75 mg by mouth daily.  , Disp: , Rfl:  dexlansoprazole (DEXILANT) 60 MG capsule, Take 60 mg by mouth 2 (two) times daily.  , Disp: , Rfl: ;  glyBURIDE (DIABETA) 2.5 MG tablet, Take 2.5 mg by mouth daily.  , Disp: , Rfl: ;  hydrochlorothiazide 25 MG tablet, Take 25 mg by mouth daily.  , Disp: , Rfl: ;  iron polysaccharides (NIFEREX) 150 MG capsule, Take 150 mg by mouth 2 (two) times daily.  ,  Disp: , Rfl:  isosorbide mononitrate (IMDUR) 30 MG 24 hr tablet, Take 1/2 tablet by mouth every morning and every evening , Disp: , Rfl: ;  metoprolol succinate (TOPROL-XL) 25 MG 24 hr tablet, Take 25 mg by mouth daily.  , Disp: , Rfl: ;  nitroGLYCERIN (NITROSTAT) 0.4 MG SL tablet, Place 0.4 mg under the tongue every 5 (five) minutes as needed.  , Disp: , Rfl:  omega-3 acid ethyl esters (LOVAZA) 1 G capsule, Take 2 g by mouth 2 (two) times daily.  , Disp: , Rfl: ;  pioglitazone-metformin (ACTOPLUS MET) 15-500 MG per tablet, Take 1 tablet by mouth daily.  , Disp: , Rfl: ;  ramipril (ALTACE) 2.5 MG capsule, Take 2.5 mg by mouth daily.  , Disp: , Rfl: ;  sitaGLIPtan (JANUVIA) 100 MG tablet, Take 100 mg by mouth daily.  , Disp: , Rfl:   BP 120/69  Pulse 80  Resp 18  Ht 5\' 4"  (1.626 m)  Wt 163 lb (73.936 kg)  BMI 27.98 kg/m2  Body mass index is 27.98 kg/(m^2).       Physical exam well-developed white female no acute  distress. HEENT normal. She has absent distal pulsation bilaterally. She does have a small clean ulcer on her right great toe.  Noninvasive vascular lab. Drop in her ankle arm index on the right 0.26 down from 0.43 in August of 2012. Stable left ankle arm index at 0.84. She had graft imaging and this revealed patency of her right femoropopliteal bypass but extensive narrowing in her iliac system.  Impression and plan: Aggressive ischemia in her right foot. I have recommended arteriography for further evaluation. She has known iliac disease and has undergone prior iliac stenting. If she has lesions amenable to restenting this would be accomplished if not we would recommend consideration of aortofemoral bypass. She is scheduled for outpatient arteriogram on November 13.

## 2011-05-17 NOTE — Interval H&P Note (Signed)
History and Physical Interval Note:   05/17/2011   12:44 PM   [name]  has presented today for surgery, with the diagnosis of pvd  The various methods of treatment have been discussed with the patient and family. After consideration of risks, benefits and other options for treatment, the patient has consented to  Procedure(s): ABDOMINAL AORTAGRAM, possible intervention as a surgical intervention .  The patients' history has been reviewed, patient examined, no change in status, stable for surgery.  I have reviewed the patients' chart and labs.  Questions were answered to the patient's satisfaction.     Jorge Ny  MD

## 2011-05-17 NOTE — Brief Op Note (Signed)
05/17/2011  2:39 PM  PATIENT:  [name]  65 y.o. female  PRE-OPERATIVE DIAGNOSIS:  pvd  POST-OPERATIVE DIAGNOSIS:  * No post-op diagnosis entered *  PROCEDURE:  Procedure(s): ABDOMINAL AORTAGRAM LOWER EXTREMITY ANGIOGRAM PERCUTANEOUS STENT INTERVENTION  SURGEON:  Surgeon(s): Nada Libman, MD  PHYSICIAN ASSISTANT:   ASSISTANTS: none   ANESTHESIA:   none  EBL:     BLOOD ADMINISTERED:none  DRAINS: none   LOCAL MEDICATIONS USED:  LIDOCAINE 6CC  SPECIMEN:  No Specimen  DISPOSITION OF SPECIMEN:  N/A  COUNTS:  YES  TOURNIQUET:  * No tourniquets in log *  DICTATION: .Note written in EPIC  PLAN OF CARE: Discharge to home after PACU  PATIENT DISPOSITION:  PACU - hemodynamically stable.   Delay start of Pharmacological VTE agent (>24hrs) due to surgical blood loss or risk of bleeding:  {YES/NO/NOT APPLICABLE:20182

## 2011-05-17 NOTE — Procedures (Signed)
IV THERAPY ATTEMPTED X3 , DR. Myra Gianotti NOTIFIED

## 2011-05-18 LAB — POCT I-STAT, CHEM 8
BUN: 16 mg/dL (ref 6–23)
Calcium, Ion: 1.12 mmol/L (ref 1.12–1.32)
Creatinine, Ser: 0.9 mg/dL (ref 0.50–1.10)
Glucose, Bld: 114 mg/dL — ABNORMAL HIGH (ref 70–99)
Hemoglobin: 13.3 g/dL (ref 12.0–15.0)
Sodium: 139 mEq/L (ref 135–145)
TCO2: 26 mmol/L (ref 0–100)

## 2011-05-19 NOTE — Procedures (Unsigned)
AORTA-ILIAC DUPLEX EVALUATION  INDICATION:  Follow up right CIA and EIA stents.  HISTORY: Diabetes:  Yes. Cardiac:  Yes. Hypertension:  Yes. Smoking:  Previous. Previous Surgery:  Multiple bilateral lower extremity revascularizations.   AORTA-ILIAC DUPLEX EXAM Aorta - Proximal Aorta - Mid Aorta - Distal  RIGHT                                   LEFT 85                CIA-PROXIMAL 110               CIA-DISTAL                   HYPOGASTRIC 400 - 97          EIA-PROXIMAL 484               EIA-MID 169               EIA-DISTAL  IMPRESSION: 1. Evidence of significant stenosis of right iliac stents. 2. The remainder of the right lower extremity is detailed on a     separated report.  ___________________________________________ Larina Earthly, M.D.  LT/MEDQ  D:  05/12/2011  T:  05/12/2011  Job:  [id number]

## 2011-05-19 NOTE — Procedures (Unsigned)
BYPASS GRAFT EVALUATION  INDICATION:  Followup right lower extremity graft, history of multiple bilateral lower extremity revascularizations.  HISTORY: Diabetes:  Yes Cardiac:  Yes Hypertension:  Yes Smoking:  Previous Previous Surgery:  Right fem-pop revision 10/30/2007, right above-knee to below-knee popliteal jump graft 09/16/2010, left fem-pop graft 07/09/2007, left femoral endarterectomy 07/13/2010, bilateral CIA, right EIA, right popliteal stents 03/30/2010.  SINGLE LEVEL ARTERIAL EXAM                              RIGHT              LEFT Brachial: Anterior tibial: Posterior tibial: Peroneal: Ankle/brachial index:  PREVIOUS ABI:  Date: 02/08/2011  RIGHT:  0.43  LEFT:  0.81  LOWER EXTREMITY BYPASS GRAFT DUPLEX EXAM:  DUPLEX: 1. Widely patent right femoral popliteal graft and jump graft to the     below-knee popliteal artery. 2. Waveforms are monophasic and dampened within the graft. 3. There is significant inflow disease observed in the common femoral     artery.  The right iliac system is detailed on separate report. 4. Please see attached worksheet for velocity details.  IMPRESSION:  Widely patent right lower extremity grafts with significant inflow disease observed.  ___________________________________________ Larina Earthly, M.D.  LT/MEDQ  D:  05/12/2011  T:  05/12/2011  Job:  [id number]

## 2011-05-21 NOTE — Op Note (Signed)
[facility]  Patient name: [name] MRN: [id number] DOB: 20-Jun-1946 Sex: female  05/17/2011 Pre-operative Diagnosis: Right toe ulcer Post-operative diagnosis:  Same Surgeon:  Jorge Ny Procedure Performed:  1.  ultrasound access left femoral artery  2.  abdominal aortogram  3.  bilateral lower extremity runoff  4.  second order catheterization  5.  stent right external iliac artery (Abbott 7 x 40)    Indications:  The patient comes in today for arteriogram she has had multiple interventions in the past she now has a new sore on her right toe  Procedure:  The patient was identified in the holding area and taken to room 8.  The patient was then placed supine on the table and prepped and draped in the usual sterile fashion.  A time out was called.  Ultrasound was used to evaluate the left common femoral artery.  It was patent .  A digital ultrasound image was acquired.  A micropuncture needle was used to access the left common femoral artery under ultrasound guidance.  An 018 wire was advanced without resistance and a micropuncture sheath was placed.  The 018 wire was removed and a benson wire was placed.  The micropuncture sheath was exchanged for a 5 french sheath.  An omniflush catheter was advanced over the wire to the level of L-1.  An abdominal angiogram was obtained. Next the cath was pulled to the aortic bifurcation and pelvic antrum was performed. This was followed by bilateral lower extremity runoff Findings:   Aortogram:  The visualized portions of the suprarenal abdominal aorta showed no significant disease. There is no evidence of renal artery stenosis. The infrarenal abdominal aorta is small but patent. Bilateral common iliac arteries are widely patent. The left external iliac artery is patent. The right external iliac artery is patent the stent is patent however there is new disease within the distal portion of the stent as well as in  the native external iliac artery distal to the stent approximate stenosis is 80%  Right Lower Extremity:  There is approximately 40% stenosis within the right common femoral artery the right femoral-popliteal bypass is patent. There is a jump graft that is also patent down to the below knee popliteal artery there are areas of calcification within the below knee popliteal artery the dominant runoff into the peroneal artery. The posterior tibial artery is occluded there is a large collateral because down to the ankle.   Left Lower Extremity:   there is significant narrowing in the left common femoral artery. The left profunda femoral artery is diseased but patent without definitive stenosis there is a left femoral to above-knee popliteal artery bypass graft which is patent. The below knee popliteal artery is patent however at its distalmost extent there is focal stenosis of approximately 60% two-vessel runoff via the peroneal and posterior tibial artery is observed.   Intervention:  at this point the decision was made to intervene on the right external iliac artery. Using the Omni flush catheter and a Bentson wire the aortic bifurcation was crossed the wire was placed in the right external iliac artery. A sheath exchange was performed and a 7 [place] cancer 1 sheath was placed into the right external iliac artery. The patient was given systemic heparinization. A long 035 wire was advanced across the stenosis into the common femoral artery. I elected to primarily stent this area. An Abbott 7 x 40 self-expanding stent was deployed within the previous stent extending  distally. The stent was employed embolic confirmation of the 6 mm balloon. A completion arteriogram revealed resolution of the stenosis within the right external iliac artery. At this point the sheath was withdrawn to the left external iliac artery and the wire was removed. The patient taken to the holding area for sheath pull once her coagulation profile  corrects.  Impression:  #1  Successful stenting of a right external iliac artery using an Abbott 7 x 40 self-expanding stent  #2  Bilateral common femoral artery stenosis   #3   distal left below knee popliteal artery stenosis and two-vessel runoff via the posterior tibial and peroneal artery   #4  single-vessel runoff on the right via the peroneal artery      Myra Gianotti IV, Lala Lund [facility] Office: [phone] Pager:  [phone]

## 2011-06-06 ENCOUNTER — Encounter: Payer: Self-pay | Admitting: Vascular Surgery

## 2011-06-07 ENCOUNTER — Encounter: Payer: Self-pay | Admitting: Vascular Surgery

## 2011-06-07 ENCOUNTER — Ambulatory Visit (INDEPENDENT_AMBULATORY_CARE_PROVIDER_SITE_OTHER): Payer: Medicare Other | Admitting: Vascular Surgery

## 2011-06-07 VITALS — BP 101/70 | HR 90 | Resp 16 | Ht 64.0 in | Wt 164.0 lb

## 2011-06-07 DIAGNOSIS — I70219 Atherosclerosis of native arteries of extremities with intermittent claudication, unspecified extremity: Secondary | ICD-10-CM

## 2011-06-07 NOTE — Progress Notes (Signed)
The patient presents today for followup of her right external iliac artery angioplasty and stenting 2 weeks ago. She had no immediate complications. She does have improved healing in her great toe ulceration. The base of this is or superficial there is no drainage and it does have a healing base. She does have a palpable popliteal pulse.  Past Medical History  Diagnosis Date  . Hypertension     Unspecified  . Coronary artery disease     a. s/p NSTEMI 02/2009 - PCI LCX with Xience DES. Otherwise branch vessel and Dist. RCA dzs. NL EF.   Marland Kitchen Hyperlipidemia     Mixed  . Diabetes mellitus   . Tobacco abuse     Remote  . Peripheral vascular disease     unspecified, a. s/p L CEA b. s/p B fem-pop bypass    History  Substance Use Topics  . Smoking status: Former Smoker    Types: Cigarettes    Quit date: 07/05/2001  . Smokeless tobacco: Not on file  . Alcohol Use: 7.0 oz/week    14 drink(s) per week    Family History  Problem Relation Age of Onset  . Heart attack Mother   . Heart attack Father   . Hypertension Brother   . Diabetes Brother   . Hypertension Brother     Allergies  Allergen Reactions  . Acetaminophen Other (See Comments)    Does not tolerate well  . Codeine Nausea And Vomiting  . Erythromycin Nausea And Vomiting  . Hydromorphone Nausea And Vomiting  . Ibuprofen Other (See Comments)    Does not tolerate well  . Propoxyphene Hcl Nausea And Vomiting  . Statins Other (See Comments)    Leg cramps    Current outpatient prescriptions:ALPRAZolam (XANAX) 0.25 MG tablet, Take 0.25 mg by mouth daily. , Disp: , Rfl: ;  aspirin 81 MG tablet, Take 81 mg by mouth daily. , Disp: , Rfl: ;  calcium carbonate (TUMS) 500 MG chewable tablet, Chew 1-4 tablets by mouth as needed. For acid reflux, Disp: , Rfl: ;  clopidogrel (PLAVIX) 75 MG tablet, Take 75 mg by mouth daily. , Disp: , Rfl:  dexlansoprazole (DEXILANT) 60 MG capsule, Take 60 mg by mouth 2 (two) times daily. , Disp: , Rfl: ;   glyBURIDE (DIABETA) 2.5 MG tablet, Take 2.5 mg by mouth daily. , Disp: , Rfl: ;  hydrochlorothiazide 25 MG tablet, Take 25 mg by mouth daily. , Disp: , Rfl: ;  iron polysaccharides (NIFEREX) 150 MG capsule, Take 150 mg by mouth 2 (two) times daily. , Disp: , Rfl:  isosorbide mononitrate (IMDUR) 30 MG 24 hr tablet, Take 15 mg by mouth 2 (two) times daily. Take 1/2 tablet by mouth every morning and every evening, Disp: , Rfl: ;  metoprolol succinate (TOPROL-XL) 25 MG 24 hr tablet, Take 25 mg by mouth daily. , Disp: , Rfl: ;  nitroGLYCERIN (NITROSTAT) 0.4 MG SL tablet, Place 0.4 mg under the tongue every 5 (five) minutes as needed. For chest pain, Disp: , Rfl:  omega-3 acid ethyl esters (LOVAZA) 1 G capsule, Take 2 g by mouth 2 (two) times daily. , Disp: , Rfl: ;  pioglitazone-metformin (ACTOPLUS MET) 15-500 MG per tablet, Take 1 tablet by mouth daily. , Disp: , Rfl: ;  ramipril (ALTACE) 2.5 MG capsule, Take 2.5 mg by mouth daily. , Disp: , Rfl: ;  sitaGLIPtan (JANUVIA) 100 MG tablet, Take 100 mg by mouth daily. , Disp: , Rfl:   BP 101/70  Pulse 90  Resp 16  Ht 5\' 4"  (1.626 m)  Wt 164 lb (74.39 kg)  BMI 28.15 kg/m2  SpO2 97%  Body mass index is 28.15 kg/(m^2).       Review of systems: No change.  Physical exam: Healing right medial toe ulcer no surrounding erythema palpable right popliteal pulse.  Impression and plan : Continued observation. I discussed her arteriogram at length with the patient. She does have extensive common femoral disease bilaterally. He does have small aortoiliac segments. I explained but she and all likelihood will eventually require aortobifemoral bypass but there is certainly no indication currently. Partially her arteriogram did show excellent patency with no problems with her bilateral femoropopliteal bypasses. We will see her again in 6 months with ankle arm index, graft scan in the office visit.

## 2011-07-20 ENCOUNTER — Telehealth: Payer: Self-pay | Admitting: Cardiovascular Disease

## 2011-07-20 MED ORDER — PANTOPRAZOLE SODIUM 40 MG PO TBEC: 40.0000 mg | DELAYED_RELEASE_TABLET | Freq: Every day | ORAL | Status: DC

## 2011-07-20 NOTE — Telephone Encounter (Signed)
Per Dr Excell Seltzer the pt can try Protonix 40mg  daily in the place of Dexilant.  The pt needs to follow-up with PCP for GERD.  I left a message for the pt to call back.

## 2011-07-20 NOTE — Telephone Encounter (Signed)
F/U  Patient returning nurse LB call. She can be reached at hm#

## 2011-07-20 NOTE — Telephone Encounter (Signed)
I spoke with the pt and made her aware that she could try Protonix in the place of Dexilant.  The pt said that she had side effects from Dexilant.  I made the pt aware that I would send in a 30 day supply of Protonix to see if this helps her GERD.  The pt will follow-up with Dr Evlyn Kanner for future prescriptions and treatment for GERD. The pt could also try Pepcid OTC if Protonix does not work.

## 2011-07-20 NOTE — Telephone Encounter (Signed)
Pt calling re acid reflux med and taking plavix which makes it worse, ok to take previsid, or change to xarelto , pls call

## 2011-07-26 ENCOUNTER — Other Ambulatory Visit: Payer: Self-pay | Admitting: Nurse Practitioner

## 2011-08-08 ENCOUNTER — Encounter: Payer: Self-pay | Admitting: Vascular Surgery

## 2011-08-09 ENCOUNTER — Ambulatory Visit (INDEPENDENT_AMBULATORY_CARE_PROVIDER_SITE_OTHER): Payer: Medicare Other | Admitting: Vascular Surgery

## 2011-08-09 ENCOUNTER — Encounter (INDEPENDENT_AMBULATORY_CARE_PROVIDER_SITE_OTHER): Payer: Medicare Other | Admitting: *Deleted

## 2011-08-09 ENCOUNTER — Encounter: Payer: Self-pay | Admitting: Vascular Surgery

## 2011-08-09 ENCOUNTER — Other Ambulatory Visit (INDEPENDENT_AMBULATORY_CARE_PROVIDER_SITE_OTHER): Payer: Medicare Other | Admitting: *Deleted

## 2011-08-09 VITALS — BP 111/65 | HR 78 | Resp 16 | Ht 64.0 in | Wt 165.0 lb

## 2011-08-09 DIAGNOSIS — I6529 Occlusion and stenosis of unspecified carotid artery: Secondary | ICD-10-CM

## 2011-08-09 DIAGNOSIS — I70219 Atherosclerosis of native arteries of extremities with intermittent claudication, unspecified extremity: Secondary | ICD-10-CM

## 2011-08-09 DIAGNOSIS — Z48812 Encounter for surgical aftercare following surgery on the circulatory system: Secondary | ICD-10-CM

## 2011-08-09 DIAGNOSIS — L97509 Non-pressure chronic ulcer of other part of unspecified foot with unspecified severity: Secondary | ICD-10-CM

## 2011-08-09 DIAGNOSIS — I739 Peripheral vascular disease, unspecified: Secondary | ICD-10-CM

## 2011-08-09 NOTE — Progress Notes (Signed)
The patient presents today for a followup of extensive peripheral vascular occlusive disease. She does report stable bilateral lower extremity claudication with no rest pain. She has a ulceration over her right toe which is nearly healed. She denies any neurologic deficits. She is able to walk to the level of claudication reports this is worse in walking uphill or fast. [place] she is able to continue trips with her husband and her husband is present with her today.  Past Medical History  Diagnosis Date  . Hypertension     Unspecified  . Coronary artery disease     a. s/p NSTEMI 02/2009 - PCI LCX with Xience DES. Otherwise branch vessel and Dist. RCA dzs. NL EF.   Marland Kitchen Hyperlipidemia     Mixed  . Diabetes mellitus   . Tobacco abuse     Remote  . Peripheral vascular disease     unspecified, a. s/p L CEA b. s/p B fem-pop bypass    History  Substance Use Topics  . Smoking status: Former Smoker    Types: Cigarettes    Quit date: 07/05/2001  . Smokeless tobacco: Not on file  . Alcohol Use: 7.0 oz/week    14 drink(s) per week    Family History  Problem Relation Age of Onset  . Heart attack Mother   . Heart attack Father   . Hypertension Brother   . Diabetes Brother   . Hypertension Brother     Allergies  Allergen Reactions  . Acetaminophen Other (See Comments)    Does not tolerate well  . Codeine Nausea And Vomiting  . Erythromycin Nausea And Vomiting  . Hydromorphone Nausea And Vomiting  . Ibuprofen Other (See Comments)    Does not tolerate well  . Propoxyphene Hcl Nausea And Vomiting  . Statins Other (See Comments)    Leg cramps    Current outpatient prescriptions:ALPRAZolam (XANAX) 0.25 MG tablet, Take 0.25 mg by mouth daily. , Disp: , Rfl: ;  aspirin 81 MG tablet, Take 81 mg by mouth daily. , Disp: , Rfl: ;  calcium carbonate (TUMS) 500 MG chewable tablet, Chew 1-4 tablets by mouth as needed. For acid reflux, Disp: , Rfl: ;  dexlansoprazole (DEXILANT) 60 MG capsule, Take 60  mg by mouth 2 (two) times daily., Disp: , Rfl:  glyBURIDE (DIABETA) 2.5 MG tablet, Take 2.5 mg by mouth daily. , Disp: , Rfl: ;  hydrochlorothiazide 25 MG tablet, Take 25 mg by mouth daily. , Disp: , Rfl: ;  iron polysaccharides (NIFEREX) 150 MG capsule, Take 150 mg by mouth 2 (two) times daily. , Disp: , Rfl: ;  isosorbide mononitrate (IMDUR) 30 MG 24 hr tablet, Take 15 mg by mouth 2 (two) times daily. Take 1/2 tablet by mouth every morning and every evening, Disp: , Rfl:  metoprolol succinate (TOPROL-XL) 25 MG 24 hr tablet, Take 25 mg by mouth daily. , Disp: , Rfl: ;  nitroGLYCERIN (NITROSTAT) 0.4 MG SL tablet, Place 0.4 mg under the tongue every 5 (five) minutes as needed. For chest pain, Disp: , Rfl: ;  omega-3 acid ethyl esters (LOVAZA) 1 G capsule, Take 2 g by mouth 2 (two) times daily. , Disp: , Rfl: ;  pioglitazone-metformin (ACTOPLUS MET) 15-500 MG per tablet, Take 1 tablet by mouth daily. , Disp: , Rfl:  PLAVIX 75 MG tablet, TAKE 1 TABLET BY MOUTH EVERY DAY WITH A MEAL, Disp: 30 tablet, Rfl: 6;  ramipril (ALTACE) 2.5 MG capsule, Take 1.25 mg by mouth daily. , Disp: ,  Rfl: ;  sitaGLIPtan (JANUVIA) 100 MG tablet, Take 100 mg by mouth daily. , Disp: , Rfl: ;  Vitamin D, Ergocalciferol, (DRISDOL) 50000 UNITS CAPS, Take 50,000 Units by mouth 3 (three) times a week., Disp: , Rfl:  pantoprazole (PROTONIX) 40 MG tablet, Take 1 tablet (40 mg total) by mouth daily., Disp: 30 tablet, Rfl: 0  BP 111/65  Pulse 78  Resp 16  Ht 5\' 4"  (1.626 m)  Wt 165 lb (74.844 kg)  BMI 28.32 kg/m2  Body mass index is 28.32 kg/(m^2).        This exam well-developed well-nourished white female no acute distress. While carotid incision with no evidence of bruits bilaterally. She does have palpable femoral pulses bilaterally. There is a very small 1-2 mm open ulcer on the medial aspect of the pad of her right great toe. There was some callus present which I debrided in the office.  Vascular lab: Stable ankle arm index  at 0.36 on the right and 0.89 on the left  Carotid duplex with widely patent left carotid endarterectomy site and moderate 40-59% stenosis in the right internal carotid artery  Impression and plan: Stable situation in her extensive peripheral vascular occlusive disease. I reviewed her most recent arteriogram where she underwent stenting in her right external iliac artery. She does have some stenosis in the common femoral external iliac junction on the right. She has patent femoropopliteal bypasses bilaterally. I discussed options with Mr. and Ms. Joanne Gavel. I have recommended continued observation. I do not feel that this level of narrowing puts her at increased risk for bypass especially in light of her multiple prior procedures. We will continue graft surveillance with repeat ankle arm indices and graft scan in 6 months and plan to repeat her carotid duplex in one year. She'll notify should she develop any new symptoms

## 2011-08-11 ENCOUNTER — Other Ambulatory Visit: Payer: Medicare Other

## 2011-08-16 ENCOUNTER — Ambulatory Visit: Payer: Medicare Other | Admitting: Vascular Surgery

## 2011-08-16 ENCOUNTER — Other Ambulatory Visit: Payer: Medicare Other

## 2011-08-16 NOTE — Procedures (Unsigned)
CAROTID DUPLEX EXAM  INDICATION:  Carotid endarterectomy.  HISTORY: Diabetes:  Yes. Cardiac:  No. Hypertension:  Yes. Smoking:  Previous. Previous Surgery:  Left carotid endarterectomy on 09/02/2003. CV History:  Currently asymptomatic. Amaurosis Fugax No, Paresthesias No, Hemiparesis No                                      RIGHT             LEFT Brachial systolic pressure:         139               106 Brachial Doppler waveforms:         Normal            Abnormal Vertebral direction of flow:        Antegrade         Antegrade DUPLEX VELOCITIES (cm/sec) CCA peak systolic                   180               87 ECA peak systolic                   150               198 ICA peak systolic                   165               113 ICA end diastolic                   45                25 PLAQUE MORPHOLOGY:                  Mixed             Mixed PLAQUE AMOUNT:                      Moderate          Mild PLAQUE LOCATION:                    ICA/ECA/CCA       ICA/ECA/CCA  IMPRESSION: 1. Doppler velocity suggested 40-59% of the right proximal internal     carotid artery.  There is an elevated velocity of 180 cm/s noted in     the right mid common carotid artery. 2. Patent left carotid endarterectomy site with no hemodynamically     significant stenosis of the left internal carotid artery. 3. There is a significant difference in the bilateral brachial     pressures.  The left vertebral artery appears to have antegrade     flow; however, this could be a patent collateral branch based on     limited visualization. 4. Velocities of the right internal carotid artery appear less than     previously recorded when compared to the previous exam on     02/08/2011.  No significant change in the left internal carotid     artery.  ___________________________________________ Larina Earthly, M.D.  CH/MEDQ  D:  08/11/2011  T:  08/11/2011  Job:  [id number]

## 2011-08-22 ENCOUNTER — Encounter (HOSPITAL_COMMUNITY): Payer: Self-pay | Admitting: Anesthesiology

## 2011-08-22 ENCOUNTER — Other Ambulatory Visit: Payer: Medicare Other

## 2011-08-22 ENCOUNTER — Emergency Department (HOSPITAL_COMMUNITY): Payer: Medicare Other

## 2011-08-22 ENCOUNTER — Encounter (HOSPITAL_COMMUNITY): Payer: Self-pay

## 2011-08-22 ENCOUNTER — Inpatient Hospital Stay (HOSPITAL_COMMUNITY)
Admission: EM | Admit: 2011-08-22 | Discharge: 2011-08-24 | DRG: 238 | Disposition: A | Discharge status: Home / Self Care | Payer: Medicare Other | Attending: Vascular Surgery | Admitting: Vascular Surgery

## 2011-08-22 ENCOUNTER — Other Ambulatory Visit: Payer: Self-pay

## 2011-08-22 ENCOUNTER — Emergency Department (HOSPITAL_COMMUNITY): Payer: Medicare Other | Admitting: Anesthesiology

## 2011-08-22 ENCOUNTER — Encounter (HOSPITAL_COMMUNITY)
Admission: EM | Disposition: A | Discharge status: Home / Self Care | Payer: Self-pay | Source: Home / Self Care | Attending: Vascular Surgery

## 2011-08-22 ENCOUNTER — Encounter (HOSPITAL_COMMUNITY): Payer: Self-pay | Admitting: Certified Registered Nurse Anesthetist

## 2011-08-22 ENCOUNTER — Other Ambulatory Visit: Payer: Self-pay | Admitting: Vascular Surgery

## 2011-08-22 DIAGNOSIS — Z01818 Encounter for other preprocedural examination: Secondary | ICD-10-CM

## 2011-08-22 DIAGNOSIS — I2510 Atherosclerotic heart disease of native coronary artery without angina pectoris: Secondary | ICD-10-CM | POA: Diagnosis present

## 2011-08-22 DIAGNOSIS — Z01810 Encounter for preprocedural cardiovascular examination: Secondary | ICD-10-CM

## 2011-08-22 DIAGNOSIS — I252 Old myocardial infarction: Secondary | ICD-10-CM

## 2011-08-22 DIAGNOSIS — I7092 Chronic total occlusion of artery of the extremities: Secondary | ICD-10-CM | POA: Diagnosis present

## 2011-08-22 DIAGNOSIS — I743 Embolism and thrombosis of arteries of the lower extremities: Secondary | ICD-10-CM

## 2011-08-22 DIAGNOSIS — Z87891 Personal history of nicotine dependence: Secondary | ICD-10-CM

## 2011-08-22 DIAGNOSIS — T82898A Other specified complication of vascular prosthetic devices, implants and grafts, initial encounter: Secondary | ICD-10-CM

## 2011-08-22 DIAGNOSIS — I70209 Unspecified atherosclerosis of native arteries of extremities, unspecified extremity: Secondary | ICD-10-CM

## 2011-08-22 DIAGNOSIS — I70409 Unspecified atherosclerosis of autologous vein bypass graft(s) of the extremities, unspecified extremity: Principal | ICD-10-CM | POA: Diagnosis present

## 2011-08-22 DIAGNOSIS — Z951 Presence of aortocoronary bypass graft: Secondary | ICD-10-CM

## 2011-08-22 DIAGNOSIS — E785 Hyperlipidemia, unspecified: Secondary | ICD-10-CM | POA: Diagnosis present

## 2011-08-22 DIAGNOSIS — Z01812 Encounter for preprocedural laboratory examination: Secondary | ICD-10-CM

## 2011-08-22 DIAGNOSIS — E119 Type 2 diabetes mellitus without complications: Secondary | ICD-10-CM | POA: Diagnosis present

## 2011-08-22 HISTORY — DX: Other specified disorders of teeth and supporting structures: K08.89

## 2011-08-22 HISTORY — PX: INTRAOPERATIVE ARTERIOGRAM: SHX5157

## 2011-08-22 HISTORY — PX: FEMORAL-POPLITEAL BYPASS GRAFT: SHX937

## 2011-08-22 LAB — DIFFERENTIAL
Basophils Absolute: 0 10*3/uL (ref 0.0–0.1)
Basophils Relative: 0 % (ref 0–1)
Eosinophils Absolute: 0.1 10*3/uL (ref 0.0–0.7)
Eosinophils Relative: 1 % (ref 0–5)
Monocytes Absolute: 0.3 10*3/uL (ref 0.1–1.0)

## 2011-08-22 LAB — CBC
HCT: 38.6 % (ref 36.0–46.0)
MCH: 30.1 pg (ref 26.0–34.0)
MCHC: 34.2 g/dL (ref 30.0–36.0)
MCV: 87.9 fL (ref 78.0–100.0)
Platelets: 220 10*3/uL (ref 150–400)
RDW: 13.8 % (ref 11.5–15.5)
WBC: 5.7 10*3/uL (ref 4.0–10.5)

## 2011-08-22 LAB — PROTIME-INR
INR: 0.91 (ref 0.00–1.49)
Prothrombin Time: 12.5 seconds (ref 11.6–15.2)

## 2011-08-22 LAB — BASIC METABOLIC PANEL
CO2: 26 mEq/L (ref 19–32)
Calcium: 9.7 mg/dL (ref 8.4–10.5)
Creatinine, Ser: 0.66 mg/dL (ref 0.50–1.10)
GFR calc non Af Amer: >90 mL/min (ref >90)
Sodium: 142 mEq/L (ref 135–145)

## 2011-08-22 LAB — GLUCOSE, CAPILLARY: Glucose-Capillary: 107 mg/dL — ABNORMAL HIGH (ref 70–99)

## 2011-08-22 SURGERY — BYPASS GRAFT FEMORAL-POPLITEAL ARTERY
Anesthesia: General | Site: Leg Upper | Laterality: Right | Wound class: Clean

## 2011-08-22 MED ORDER — DOCUSATE SODIUM 100 MG PO CAPS
100.0000 mg | ORAL_CAPSULE | Freq: Every day | ORAL | Status: DC
  Filled 2011-08-22: qty 1

## 2011-08-22 MED ORDER — HYDROCHLOROTHIAZIDE 25 MG PO TABS
25.0000 mg | ORAL_TABLET | Freq: Every day | ORAL | Status: DC
  Filled 2011-08-22 (×2): qty 1

## 2011-08-22 MED ORDER — METFORMIN HCL 500 MG PO TABS
500.0000 mg | ORAL_TABLET | Freq: Every day | ORAL | Status: DC
  Filled 2011-08-22 (×3): qty 1

## 2011-08-22 MED ORDER — ACETAMINOPHEN 650 MG RE SUPP: 325.0000 mg | RECTAL | Status: DC | PRN

## 2011-08-22 MED ORDER — HYDROMORPHONE HCL PF 1 MG/ML IJ SOLN: 0.2500 mg | INTRAMUSCULAR | Status: DC | PRN

## 2011-08-22 MED ORDER — FENTANYL CITRATE 0.05 MG/ML IJ SOLN
INTRAMUSCULAR | Status: DC | PRN
  Administered 2011-08-22: 100 ug via INTRAVENOUS
  Administered 2011-08-22: 50 ug via INTRAVENOUS
  Administered 2011-08-22: 150 ug via INTRAVENOUS

## 2011-08-22 MED ORDER — ASPIRIN 81 MG PO TABS: 81.0000 mg | ORAL_TABLET | Freq: Every day | ORAL | Status: DC

## 2011-08-22 MED ORDER — GUAIFENESIN-DM 100-10 MG/5ML PO SYRP: 15.0000 mL | ORAL_SOLUTION | ORAL | Status: DC | PRN

## 2011-08-22 MED ORDER — PROMETHAZINE HCL 25 MG/ML IJ SOLN
6.2500 mg | INTRAMUSCULAR | Status: AC | PRN
  Administered 2011-08-22 (×2): 6.25 mg via INTRAVENOUS

## 2011-08-22 MED ORDER — POTASSIUM CHLORIDE CRYS ER 20 MEQ PO TBCR: 20.0000 meq | EXTENDED_RELEASE_TABLET | Freq: Once | ORAL | Status: AC | PRN

## 2011-08-22 MED ORDER — ACETAMINOPHEN 325 MG PO TABS: 325.0000 mg | ORAL_TABLET | ORAL | Status: DC | PRN

## 2011-08-22 MED ORDER — GLYCOPYRROLATE 0.2 MG/ML IJ SOLN
INTRAMUSCULAR | Status: DC | PRN
  Administered 2011-08-22: .8 mg via INTRAVENOUS

## 2011-08-22 MED ORDER — MAGNESIUM SULFATE 40 MG/ML IJ SOLN
2.0000 g | Freq: Once | INTRAMUSCULAR | Status: AC | PRN
  Filled 2011-08-22: qty 50

## 2011-08-22 MED ORDER — HETASTARCH-ELECTROLYTES 6 % IV SOLN
INTRAVENOUS | Status: DC | PRN
  Administered 2011-08-22: 18:00:00 via INTRAVENOUS

## 2011-08-22 MED ORDER — DEXTROSE 5 % IV SOLN
1.5000 g | INTRAVENOUS | Status: AC
  Administered 2011-08-22: 1.5 g via INTRAVENOUS
  Filled 2011-08-22: qty 1.5

## 2011-08-22 MED ORDER — GLYBURIDE 2.5 MG PO TABS
2.5000 mg | ORAL_TABLET | Freq: Every day | ORAL | Status: DC
  Administered 2011-08-24: 2.5 mg via ORAL
  Filled 2011-08-22 (×3): qty 1

## 2011-08-22 MED ORDER — POTASSIUM CHLORIDE IN NACL 20-0.9 MEQ/L-% IV SOLN
INTRAVENOUS | Status: DC
  Administered 2011-08-22: via INTRAVENOUS
  Filled 2011-08-22 (×4): qty 1000

## 2011-08-22 MED ORDER — MORPHINE SULFATE 4 MG/ML IJ SOLN
4.0000 mg | Freq: Once | INTRAMUSCULAR | Status: AC
  Administered 2011-08-22: 4 mg via INTRAVENOUS

## 2011-08-22 MED ORDER — ALPRAZOLAM 0.25 MG PO TABS
0.2500 mg | ORAL_TABLET | Freq: Every day | ORAL | Status: DC
  Administered 2011-08-22: 0.25 mg via ORAL
  Filled 2011-08-22: qty 1

## 2011-08-22 MED ORDER — HEPARIN SODIUM (PORCINE) 1000 UNIT/ML IJ SOLN
INTRAMUSCULAR | Status: DC | PRN
  Administered 2011-08-22: 2000 [IU] via INTRAVENOUS
  Administered 2011-08-22: 7000 [IU] via INTRAVENOUS

## 2011-08-22 MED ORDER — ROCURONIUM BROMIDE 100 MG/10ML IV SOLN
INTRAVENOUS | Status: DC | PRN
  Administered 2011-08-22 (×3): 10 mg via INTRAVENOUS
  Administered 2011-08-22: 20 mg via INTRAVENOUS
  Administered 2011-08-22: 50 mg via INTRAVENOUS

## 2011-08-22 MED ORDER — POLYSACCHARIDE IRON 150 MG PO CAPS
150.0000 mg | ORAL_CAPSULE | Freq: Two times a day (BID) | ORAL | Status: DC
  Administered 2011-08-23: 150 mg via ORAL
  Filled 2011-08-22 (×6): qty 1

## 2011-08-22 MED ORDER — PROPOFOL 10 MG/ML IV EMUL
INTRAVENOUS | Status: DC | PRN
  Administered 2011-08-22: 70 mg via INTRAVENOUS

## 2011-08-22 MED ORDER — MIDAZOLAM HCL 2 MG/2ML IJ SOLN: 1.0000 mg | INTRAMUSCULAR | Status: DC | PRN

## 2011-08-22 MED ORDER — ISOSORBIDE MONONITRATE 15 MG HALF TABLET
15.0000 mg | ORAL_TABLET | Freq: Two times a day (BID) | ORAL | Status: DC
  Administered 2011-08-22 – 2011-08-23 (×2): 15 mg via ORAL
  Filled 2011-08-22 (×6): qty 1

## 2011-08-22 MED ORDER — SODIUM CHLORIDE 0.9 % IV SOLN: 500.0000 mL | Freq: Once | INTRAVENOUS | Status: AC | PRN

## 2011-08-22 MED ORDER — LORAZEPAM 2 MG/ML IJ SOLN: 1.0000 mg | Freq: Once | INTRAMUSCULAR | Status: DC | PRN

## 2011-08-22 MED ORDER — IOHEXOL 300 MG/ML  SOLN
INTRAMUSCULAR | Status: DC | PRN
  Administered 2011-08-22: 18 mL

## 2011-08-22 MED ORDER — 0.9 % SODIUM CHLORIDE (POUR BTL) OPTIME
TOPICAL | Status: DC | PRN
  Administered 2011-08-22: 1000 mL

## 2011-08-22 MED ORDER — LACTATED RINGERS IV SOLN
INTRAVENOUS | Status: DC | PRN
  Administered 2011-08-22 (×3): via INTRAVENOUS

## 2011-08-22 MED ORDER — PIOGLITAZONE HCL 15 MG PO TABS
15.0000 mg | ORAL_TABLET | Freq: Every day | ORAL | Status: DC
  Filled 2011-08-22 (×3): qty 1

## 2011-08-22 MED ORDER — FENTANYL CITRATE 0.05 MG/ML IJ SOLN: 50.0000 ug | INTRAMUSCULAR | Status: DC | PRN

## 2011-08-22 MED ORDER — LABETALOL HCL 5 MG/ML IV SOLN
10.0000 mg | INTRAVENOUS | Status: DC | PRN
  Filled 2011-08-22: qty 4

## 2011-08-22 MED ORDER — VITAMIN D (ERGOCALCIFEROL) 1.25 MG (50000 UNIT) PO CAPS
50000.0000 [IU] | ORAL_CAPSULE | ORAL | Status: DC
  Filled 2011-08-22: qty 1

## 2011-08-22 MED ORDER — OXYCODONE HCL 5 MG PO TABS: 5.0000 mg | ORAL_TABLET | ORAL | Status: DC | PRN

## 2011-08-22 MED ORDER — PHENOL 1.4 % MT LIQD: 1.0000 | OROMUCOSAL | Status: DC | PRN

## 2011-08-22 MED ORDER — IOHEXOL 350 MG/ML SOLN
100.0000 mL | Freq: Once | INTRAVENOUS | Status: AC | PRN
  Administered 2011-08-22: 100 mL via INTRAVENOUS

## 2011-08-22 MED ORDER — FAMOTIDINE IN NACL 20-0.9 MG/50ML-% IV SOLN
20.0000 mg | Freq: Two times a day (BID) | INTRAVENOUS | Status: DC
  Administered 2011-08-22 – 2011-08-23 (×2): 20 mg via INTRAVENOUS
  Filled 2011-08-22 (×6): qty 50

## 2011-08-22 MED ORDER — MORPHINE SULFATE 4 MG/ML IJ SOLN
4.0000 mg | Freq: Once | INTRAMUSCULAR | Status: AC
  Administered 2011-08-22: 4 mg via INTRAVENOUS
  Filled 2011-08-22: qty 1

## 2011-08-22 MED ORDER — METOPROLOL TARTRATE 1 MG/ML IV SOLN: 2.0000 mg | INTRAVENOUS | Status: DC | PRN

## 2011-08-22 MED ORDER — PHENYLEPHRINE HCL 10 MG/ML IJ SOLN
10.0000 mg | INTRAVENOUS | Status: DC | PRN
  Administered 2011-08-22: 40 ug/min via INTRAVENOUS

## 2011-08-22 MED ORDER — PROTAMINE SULFATE 10 MG/ML IV SOLN
INTRAVENOUS | Status: DC | PRN
  Administered 2011-08-22 (×2): 50 mg via INTRAVENOUS

## 2011-08-22 MED ORDER — ASPIRIN EC 81 MG PO TBEC
81.0000 mg | DELAYED_RELEASE_TABLET | Freq: Every day | ORAL | Status: DC
  Administered 2011-08-22: 81 mg via ORAL
  Filled 2011-08-22 (×3): qty 1

## 2011-08-22 MED ORDER — METOPROLOL SUCCINATE ER 25 MG PO TB24
25.0000 mg | ORAL_TABLET | Freq: Every day | ORAL | Status: DC
  Administered 2011-08-23: 12.5 mg via ORAL
  Filled 2011-08-22 (×2): qty 1

## 2011-08-22 MED ORDER — CLOPIDOGREL BISULFATE 75 MG PO TABS
75.0000 mg | ORAL_TABLET | Freq: Every day | ORAL | Status: DC
  Administered 2011-08-23 – 2011-08-24 (×2): 75 mg via ORAL
  Filled 2011-08-22 (×3): qty 1

## 2011-08-22 MED ORDER — CALCIUM CARBONATE ANTACID 500 MG PO CHEW
1.0000 | CHEWABLE_TABLET | ORAL | Status: DC | PRN
  Administered 2011-08-23: 400 mg via ORAL
  Filled 2011-08-22 (×2): qty 4

## 2011-08-22 MED ORDER — PANTOPRAZOLE SODIUM 40 MG PO TBEC
40.0000 mg | DELAYED_RELEASE_TABLET | Freq: Every day | ORAL | Status: DC
  Administered 2011-08-22: 40 mg via ORAL
  Filled 2011-08-22: qty 1

## 2011-08-22 MED ORDER — RAMIPRIL 1.25 MG PO CAPS
1.2500 mg | ORAL_CAPSULE | Freq: Every day | ORAL | Status: DC
Start: 2011-08-23 — End: 2011-08-24
  Administered 2011-08-23: 1.25 mg via ORAL
  Filled 2011-08-22 (×2): qty 1

## 2011-08-22 MED ORDER — MORPHINE SULFATE 4 MG/ML IJ SOLN
INTRAMUSCULAR | Status: AC
  Filled 2011-08-22: qty 1

## 2011-08-22 MED ORDER — HYDRALAZINE HCL 20 MG/ML IJ SOLN
10.0000 mg | INTRAMUSCULAR | Status: DC | PRN
  Filled 2011-08-22: qty 0.5

## 2011-08-22 MED ORDER — MIDAZOLAM HCL 5 MG/5ML IJ SOLN
INTRAMUSCULAR | Status: DC | PRN
  Administered 2011-08-22 (×2): 1 mg via INTRAVENOUS

## 2011-08-22 MED ORDER — NITROGLYCERIN 0.4 MG SL SUBL: 0.4000 mg | SUBLINGUAL_TABLET | SUBLINGUAL | Status: DC | PRN

## 2011-08-22 MED ORDER — DOPAMINE-DEXTROSE 3.2-5 MG/ML-% IV SOLN: 3.0000 ug/kg/min | INTRAVENOUS | Status: DC

## 2011-08-22 MED ORDER — NEOSTIGMINE METHYLSULFATE 1 MG/ML IJ SOLN
INTRAMUSCULAR | Status: DC | PRN
  Administered 2011-08-22: 4 mg via INTRAVENOUS

## 2011-08-22 MED ORDER — LINAGLIPTIN 5 MG PO TABS
5.0000 mg | ORAL_TABLET | Freq: Every day | ORAL | Status: DC
  Filled 2011-08-22 (×2): qty 1

## 2011-08-22 MED ORDER — SODIUM CHLORIDE 0.9 % IV SOLN
Freq: Once | INTRAVENOUS | Status: AC
  Administered 2011-08-22: 11:00:00 via INTRAVENOUS

## 2011-08-22 MED ORDER — MORPHINE SULFATE 2 MG/ML IJ SOLN
2.0000 mg | INTRAMUSCULAR | Status: DC | PRN
  Administered 2011-08-22 – 2011-08-24 (×11): 2 mg via INTRAVENOUS
  Filled 2011-08-22 (×11): qty 1

## 2011-08-22 MED ORDER — ONDANSETRON HCL 4 MG/2ML IJ SOLN
INTRAMUSCULAR | Status: DC | PRN
  Administered 2011-08-22 (×2): 4 mg via INTRAVENOUS

## 2011-08-22 MED ORDER — ONDANSETRON HCL 4 MG/2ML IJ SOLN
4.0000 mg | Freq: Four times a day (QID) | INTRAMUSCULAR | Status: DC | PRN
  Administered 2011-08-23: 4 mg via INTRAVENOUS
  Filled 2011-08-22: qty 2

## 2011-08-22 MED ORDER — OMEGA-3-ACID ETHYL ESTERS 1 G PO CAPS
2.0000 g | ORAL_CAPSULE | Freq: Two times a day (BID) | ORAL | Status: DC
  Administered 2011-08-22: 2 g via ORAL
  Filled 2011-08-22 (×5): qty 2

## 2011-08-22 MED ORDER — PIOGLITAZONE HCL-METFORMIN HCL 15-500 MG PO TABS: 1.0000 | ORAL_TABLET | Freq: Every day | ORAL | Status: DC

## 2011-08-22 MED ORDER — INSULIN ASPART 100 UNIT/ML ~~LOC~~ SOLN
0.0000 [IU] | Freq: Three times a day (TID) | SUBCUTANEOUS | Status: DC
  Administered 2011-08-23: 5 [IU] via SUBCUTANEOUS
  Administered 2011-08-24 (×2): 3 [IU] via SUBCUTANEOUS
  Filled 2011-08-22: qty 3

## 2011-08-22 MED ORDER — DEXTROSE 5 % IV SOLN
1.5000 g | Freq: Two times a day (BID) | INTRAVENOUS | Status: AC
  Administered 2011-08-23: 1.5 g via INTRAVENOUS
  Filled 2011-08-22 (×2): qty 1.5

## 2011-08-22 MED ORDER — SODIUM CHLORIDE 0.9 % IR SOLN
Status: DC | PRN
  Administered 2011-08-22: 16:00:00

## 2011-08-22 SURGICAL SUPPLY — 62 items
APL SKNCLS STERI-STRIP NONHPOA (GAUZE/BANDAGES/DRESSINGS) ×3
BANDAGE ESMARK 6X9 LF (GAUZE/BANDAGES/DRESSINGS) IMPLANT
BENZOIN TINCTURE PRP APPL 2/3 (GAUZE/BANDAGES/DRESSINGS) ×4 IMPLANT
BNDG CMPR 9X6 STRL LF SNTH (GAUZE/BANDAGES/DRESSINGS)
BNDG ESMARK 6X9 LF (GAUZE/BANDAGES/DRESSINGS)
CANISTER SUCTION 2500CC (MISCELLANEOUS) ×2 IMPLANT
CANNULA VESSEL W/WING WO/VALVE (CANNULA) ×2 IMPLANT
CATH EMB 4FR 80CM (CATHETERS) ×1 IMPLANT
CLIP LIGATING EXTRA MED SLVR (CLIP) ×2 IMPLANT
CLIP LIGATING EXTRA SM BLUE (MISCELLANEOUS) ×2 IMPLANT
CLOSURE STERI STRIP 1/2 X4 (GAUZE/BANDAGES/DRESSINGS) ×2 IMPLANT
CLOTH BEACON ORANGE TIMEOUT ST (SAFETY) ×2 IMPLANT
COVER SURGICAL LIGHT HANDLE (MISCELLANEOUS) ×4 IMPLANT
CUFF TOURNIQUET SINGLE 34IN LL (TOURNIQUET CUFF) IMPLANT
CUFF TOURNIQUET SINGLE 44IN (TOURNIQUET CUFF) IMPLANT
DRAIN SNY 10X20 3/4 PERF (WOUND CARE) IMPLANT
DRAPE WARM FLUID 44X44 (DRAPE) ×2 IMPLANT
DRAPE X-RAY CASS 24X20 (DRAPES) ×1 IMPLANT
DRSG COVADERM 4X10 (GAUZE/BANDAGES/DRESSINGS) ×1 IMPLANT
DRSG COVADERM 4X6 (GAUZE/BANDAGES/DRESSINGS) ×1 IMPLANT
DRSG COVADERM 4X8 (GAUZE/BANDAGES/DRESSINGS) ×2 IMPLANT
ELECT REM PT RETURN 9FT ADLT (ELECTROSURGICAL) ×2
ELECTRODE REM PT RTRN 9FT ADLT (ELECTROSURGICAL) ×1 IMPLANT
EVACUATOR SILICONE 100CC (DRAIN) IMPLANT
GLOVE BIO SURGEON STRL SZ 6.5 (GLOVE) ×3 IMPLANT
GLOVE BIO SURGEON STRL SZ7 (GLOVE) ×2 IMPLANT
GLOVE BIOGEL PI IND STRL 7.0 (GLOVE) IMPLANT
GLOVE BIOGEL PI INDICATOR 7.0 (GLOVE) ×1
GLOVE ECLIPSE 6.5 STRL STRAW (GLOVE) ×1 IMPLANT
GLOVE SS BIOGEL STRL SZ 7.5 (GLOVE) ×1 IMPLANT
GLOVE SUPERSENSE BIOGEL SZ 7.5 (GLOVE) ×1
GOWN STRL NON-REIN LRG LVL3 (GOWN DISPOSABLE) ×5 IMPLANT
GRAFT GORETEX STRT 7X10 (Vascular Products) ×1 IMPLANT
INSERT FOGARTY SM (MISCELLANEOUS) IMPLANT
KIT BASIN OR (CUSTOM PROCEDURE TRAY) ×2 IMPLANT
KIT ROOM TURNOVER OR (KITS) ×2 IMPLANT
NS IRRIG 1000ML POUR BTL (IV SOLUTION) ×4 IMPLANT
PACK PERIPHERAL VASCULAR (CUSTOM PROCEDURE TRAY) ×2 IMPLANT
PACK UNIVERSAL I (CUSTOM PROCEDURE TRAY) ×1 IMPLANT
PAD ARMBOARD 7.5X6 YLW CONV (MISCELLANEOUS) ×4 IMPLANT
PADDING CAST COTTON 6X4 STRL (CAST SUPPLIES) IMPLANT
SET COLLECT BLD 21X3/4 12 (NEEDLE) ×1 IMPLANT
SPONGE GAUZE 4X4 12PLY (GAUZE/BANDAGES/DRESSINGS) ×2 IMPLANT
SPONGE GAUZE 4X4 FOR O.R. (GAUZE/BANDAGES/DRESSINGS) ×2 IMPLANT
SPONGE LAP 18X18 X RAY DECT (DISPOSABLE) ×1 IMPLANT
STAPLER VISISTAT 35W (STAPLE) IMPLANT
STOPCOCK 4 WAY LG BORE MALE ST (IV SETS) ×2 IMPLANT
STRIP CLOSURE SKIN 1/2X4 (GAUZE/BANDAGES/DRESSINGS) ×3 IMPLANT
SUT ETHILON 3 0 PS 1 (SUTURE) IMPLANT
SUT PROLENE 5 0 C 1 24 (SUTURE) ×2 IMPLANT
SUT PROLENE 6 0 CC (SUTURE) ×6 IMPLANT
SUT SILK 2 0 SH (SUTURE) ×2 IMPLANT
SUT VIC AB 2-0 CTX 36 (SUTURE) ×4 IMPLANT
SUT VIC AB 3-0 SH 27 (SUTURE) ×4
SUT VIC AB 3-0 SH 27X BRD (SUTURE) ×2 IMPLANT
SYR 3ML LL SCALE MARK (SYRINGE) ×1 IMPLANT
TOWEL OR 17X24 6PK STRL BLUE (TOWEL DISPOSABLE) ×5 IMPLANT
TOWEL OR 17X26 10 PK STRL BLUE (TOWEL DISPOSABLE) ×4 IMPLANT
TRAY FOLEY CATH 14FRSI W/METER (CATHETERS) ×2 IMPLANT
TUBING EXTENTION W/L.L. (IV SETS) ×1 IMPLANT
UNDERPAD 30X30 INCONTINENT (UNDERPADS AND DIAPERS) ×2 IMPLANT
WATER STERILE IRR 1000ML POUR (IV SOLUTION) ×2 IMPLANT

## 2011-08-22 NOTE — Preoperative (Signed)
Beta Blockers   Reason not to administer Beta Blockers:Hold beta blocker due to other concerns of hypotension.

## 2011-08-22 NOTE — ED Notes (Signed)
Consent signed for central line placement by Dr. Arbie Cookey and patient

## 2011-08-22 NOTE — Anesthesia Postprocedure Evaluation (Signed)
  Anesthesia Post-op Note  Patient: [name]  Procedure(s) Performed: Procedure(s) (LRB): BYPASS GRAFT FEMORAL-POPLITEAL ARTERY (Right) INTRA OPERATIVE ARTERIOGRAM (Right) ENDARTERECTOMY FEMORAL (Right)  Patient Location: PACU  Anesthesia Type: General  Level of Consciousness: awake, alert  and oriented  Airway and Oxygen Therapy: Patient Spontanous Breathing  Post-op Pain: none  Post-op Assessment: Post-op Vital signs reviewed, Patient's Cardiovascular Status Stable, Respiratory Function Stable, Patent Airway, No signs of Nausea or vomiting and Pain level controlled  Post-op Vital Signs: Reviewed and stable  Complications: No apparent anesthesia complications

## 2011-08-22 NOTE — ED Provider Notes (Signed)
History     CSN: [id number]  Arrival date & time 08/22/11  0808   First MD Initiated Contact with Patient 08/22/11 [phone]      Chief Complaint  Patient presents with  . 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 The patient got up and that in a recliner with her leg hanging down which improved her symptoms briefly. She also had improvement when she took a warm shower. She was last seen in Dr. Bosie Helper office for this in the past 2 weeks. At that time she was told that she had bad readings on a Doppler done but they did not want to do surgery at that time. Patient denies any other symptoms. She reports that she did not need anything for pain at this time. Patient does have popliteal pulse intact. There no other associated or modifying factors. Past Medical History  Diagnosis Date  . Hypertension     Unspecified  . Coronary artery disease     a. s/p NSTEMI 02/2009 - PCI LCX with Xience DES. Otherwise branch vessel and Dist. RCA dzs. NL EF.   Marland Kitchen Hyperlipidemia     Mixed  . Diabetes mellitus   . Tobacco abuse     Remote  . Peripheral vascular disease     unspecified, a. s/p L CEA b. s/p B fem-pop bypass  . Loose, teeth     has loose bridge and two loose teeth holding it    Past Surgical History  Procedure Date  . Coronary artery bypass graft 2004  . Vesicovaginal fistula closure w/ tah   . Breast lumpectomy   . Colonoscopy 11/2010  . Upper gastrointestinal endoscopy 11/2010  . Femoral artery - popliteal artery bypass graft   . Tonsillectomy   . Abdominal hysterectomy     complete  . Coronary angioplasty with stent placement     Family History  Problem Relation Age of Onset  . Heart  attack Mother   . Heart attack Father   . Hypertension Brother   . Diabetes Brother   . Hypertension Brother     History  Substance Use Topics  . Smoking status: Former Smoker    Types: Cigarettes    Quit date: 07/05/2001  . Smokeless tobacco: Not on file  . Alcohol Use: 7.0 oz/week    14 drink(s) per week    OB History    Grav Para Term Preterm Abortions TAB SAB Ect Mult Living                  Review of Systems  Constitutional: Negative.   HENT: Negative.   Eyes: Negative.   Respiratory: Negative.   Cardiovascular: Negative.   Gastrointestinal: Negative.   Genitourinary: Negative.   Musculoskeletal: Negative.   Skin: Negative.   Neurological: Positive for numbness.  Hematological: Negative.   Psychiatric/Behavioral: Negative.   All other systems reviewed and are negative.    Allergies  Acetaminophen; Codeine; Erythromycin; Hydromorphone; Ibuprofen; Propoxyphene hcl; and Statins  Home Medications   Current Outpatient Rx  Name Route Sig Dispense Refill  . CLOPIDOGREL BISULFATE 75 MG PO TABS Oral Take 75 mg by mouth daily.    Marland Kitchen PANTOPRAZOLE SODIUM 40 MG PO TBEC Oral Take 40 mg by mouth daily.    Marland Kitchen  ALPRAZOLAM 0.25 MG PO TABS Oral Take 0.25 mg by mouth daily.     . ASPIRIN 81 MG PO TABS Oral Take 81 mg by mouth daily.     Marland Kitchen CALCIUM CARBONATE ANTACID 500 MG PO CHEW Oral Chew 1-4 tablets by mouth as needed. For acid reflux    . DEXLANSOPRAZOLE 60 MG PO CPDR Oral Take 60 mg by mouth 2 (two) times daily.    . GLYBURIDE 2.5 MG PO TABS Oral Take 2.5 mg by mouth daily.     Marland Kitchen HYDROCHLOROTHIAZIDE 25 MG PO TABS Oral Take 25 mg by mouth daily.     Marland Kitchen POLYSACCHARIDE IRON COMPLEX 150 MG PO CAPS Oral Take 150 mg by mouth 2 (two) times daily.     . ISOSORBIDE MONONITRATE ER 30 MG PO TB24 Oral Take 15 mg by mouth 2 (two) times daily. Take 1/2 tablet by mouth every morning and every evening    . METOPROLOL SUCCINATE ER 25 MG PO TB24 Oral Take 25 mg by mouth daily.     Marland Kitchen  NITROGLYCERIN 0.4 MG SL SUBL Sublingual Place 0.4 mg under the tongue every 5 (five) minutes as needed. For chest pain    . OMEGA-3-ACID ETHYL ESTERS 1 G PO CAPS Oral Take 2 g by mouth 2 (two) times daily.     Marland Kitchen PIOGLITAZONE HCL-METFORMIN HCL 15-500 MG PO TABS Oral Take 1 tablet by mouth daily.     Marland Kitchen RAMIPRIL 2.5 MG PO CAPS Oral Take 1.25 mg by mouth daily.     Marland Kitchen SITAGLIPTIN PHOSPHATE 100 MG PO TABS Oral Take 100 mg by mouth daily.     Marland Kitchen VITAMIN D (ERGOCALCIFEROL) 50000 UNITS PO CAPS Oral Take 50,000 Units by mouth 3 (three) times a week.      BP 169/42  Pulse 91  Temp(Src) 98.2 F (36.8 C) (Oral)  Resp 21  Ht 5\' 4"  (1.626 m)  Wt 165 lb (74.844 kg)  BMI 28.32 kg/m2  SpO2 93%  Physical Exam  Nursing note and vitals reviewed. GEN: Well-developed, well-nourished female in no distress HEENT: Atraumatic, normocephalic.  EYES: PERRLA BL, no scleral icterus. NECK: Trachea midline, no meningismus CV: regular rate and rhythm. No murmurs, rubs, or gallops PULM: No respiratory distress.  No crackles, wheezes, or rales. Neuro: cranial nerves 2-12 intact, no abnormalities of strength, A and O x 3 MSK: Patient moves all 4 extremities symmetrically, right foot with absent dorsalis pedis pulse even with Doppler. Posterior tibialis also is difficult to identify. Patient's right foot is cool and pale to the touch compared to the left foot. Psych: no abnormality of mood   ED Course  Procedures (including critical care time)   Labs Reviewed  CBC  DIFFERENTIAL  BASIC METABOLIC PANEL  PROTIME-INR   No results found.   1. Arterial occlusion, lower extremity       MDM  Patient and her family called Dr. Arbie Cookey prior to presentation today. Upon arrival patient was to have basic labs ordered. These were performed. Dr. Arbie Cookey was paged and notified in the OR. Patient did not require pain medication at this time. She will be seen by Dr. Arbie Cookey.  10:32 AM Patient was very difficult vascular  access. She was seen by Dr. Arbie Cookey here in the emergency department. He placed a central line for access and lab draws. Patient will have a CT angiogram performed here and then go directly to the OR with Dr. Arbie Cookey. She remained hemodynamically stable while in the emergency department.  Cyndra Numbers, MD 08/22/11 [phone]

## 2011-08-22 NOTE — Transfer of Care (Signed)
Immediate Anesthesia Transfer of Care Note  Patient: [name]  Procedure(s) Performed: Procedure(s) (LRB): BYPASS GRAFT FEMORAL-POPLITEAL ARTERY (Right) INTRA OPERATIVE ARTERIOGRAM (Right) ENDARTERECTOMY FEMORAL (Right)  Patient Location: PACU  Anesthesia Type: General  Level of Consciousness: awake, alert , oriented and patient cooperative  Airway & Oxygen Therapy: Patient Spontanous Breathing and Patient connected to nasal cannula oxygen  Post-op Assessment: Report given to PACU RN, Post -op Vital signs reviewed and stable and Patient moving all extremities X 4  Post vital signs: Reviewed and stable  Complications: No apparent anesthesia complications

## 2011-08-22 NOTE — ED Notes (Signed)
Attempted to call report and nurse on floor unable to take report at this time.

## 2011-08-22 NOTE — H&P (Signed)
[facility] History and Physical    Patient name: [name] MRN: [id number] DOB: [birth year] Sex: female     Reason for referral: Foot ischemia right leg  HISTORY OF PRESENT ILLNESS: The patient presents today with onset this morning of coolness numbness and pain in her right foot. She has an extensive past history with a 8 year history of superficial femoral artery stenting and subsequent bypass. 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 This was treated on the right with stenting she did have narrowing in the common femoral artery. She had been walking with typical claudication pain until Deshea Pooley this morning when she was awakened with numbness and pain  Past Medical History  Diagnosis Date  . Hypertension     Unspecified  . Coronary artery disease     a. s/p NSTEMI 02/2009 - PCI LCX with Xience DES. Otherwise branch vessel and Dist. RCA dzs. NL EF.   Marland Kitchen Hyperlipidemia     Mixed  . Diabetes mellitus   . Tobacco abuse     Remote  . Peripheral vascular disease     unspecified, a. s/p L CEA b. s/p B fem-pop bypass  . Loose, teeth     has loose bridge and two loose teeth holding it    Past Surgical History  Procedure Date  . Coronary artery bypass graft 2004  . Vesicovaginal fistula closure w/ tah   . Breast lumpectomy   . Colonoscopy 11/2010  . Upper gastrointestinal endoscopy 11/2010  . Femoral artery - popliteal artery bypass graft   . Tonsillectomy   . Abdominal hysterectomy     complete  . Coronary angioplasty with stent placement     History   Social History  . Marital Status: Married    Spouse Name: N/A    Number of Children: N/A  . Years of Education: N/A   Occupational History  . Not on file.   Social History Main Topics  . Smoking status: Former Smoker    Types: Cigarettes    Quit date: 07/05/2001  . Smokeless tobacco: Not on file  . Alcohol Use: 7.0 oz/week    14 drink(s) per  week  . Drug Use: No  . Sexually Active: Not on file   Other Topics Concern  . Not on file   Social History Narrative   Does not get regular exercise    Family History  Problem Relation Age of Onset  . Heart attack Mother   . Heart attack Father   . Hypertension Brother   . Diabetes Brother   . Hypertension Brother     Allergies  Allergen Reactions  . Acetaminophen Other (See Comments)    Does not tolerate well  . Codeine Nausea And Vomiting  . Erythromycin Nausea And Vomiting  . Hydromorphone Nausea And Vomiting  . Ibuprofen Other (See Comments)    Does not tolerate well  . Propoxyphene Hcl Nausea And Vomiting  . Statins Other (See Comments)    Leg cramps    Prior to Admission medications   Medication Sig Start Date End Date Taking? Authorizing Provider  ALPRAZolam (XANAX) 0.25 MG tablet Take 0.25 mg by mouth daily.    Yes Historical Provider, MD  aspirin 81 MG tablet Take 81 mg by mouth daily.    Yes Historical Provider, MD  calcium carbonate (TUMS) 500 MG chewable tablet Chew 1-4 tablets by mouth as needed. For acid reflux   Yes Historical Provider,  MD  clopidogrel (PLAVIX) 75 MG tablet Take 75 mg by mouth daily.   Yes Historical Provider, MD  dexlansoprazole (DEXILANT) 60 MG capsule Take 60 mg by mouth 2 (two) times daily.   Yes Historical Provider, MD  glyBURIDE (DIABETA) 2.5 MG tablet Take 2.5 mg by mouth daily.    Yes Historical Provider, MD  hydrochlorothiazide 25 MG tablet Take 25 mg by mouth daily.    Yes Historical Provider, MD  iron polysaccharides (NIFEREX) 150 MG capsule Take 150 mg by mouth 2 (two) times daily.    Yes Historical Provider, MD  isosorbide mononitrate (IMDUR) 30 MG 24 hr tablet Take 15 mg by mouth 2 (two) times daily. Take 1/2 tablet by mouth every morning and every evening   Yes Historical Provider, MD  metoprolol succinate (TOPROL-XL) 25 MG 24 hr tablet Take 25 mg by mouth daily.    Yes Historical Provider, MD  nitroGLYCERIN (NITROSTAT) 0.4  MG SL tablet Place 0.4 mg under the tongue every 5 (five) minutes as needed. For chest pain   Yes Historical Provider, MD  omega-3 acid ethyl esters (LOVAZA) 1 G capsule Take 2 g by mouth 2 (two) times daily.    Yes Historical Provider, MD  pioglitazone-metformin (ACTOPLUS MET) 15-500 MG per tablet Take 1 tablet by mouth daily. Brand name only-does not work if components are given separately per patient   Yes Historical Provider, MD  ramipril (ALTACE) 2.5 MG capsule Take 1.25 mg by mouth daily.    Yes Historical Provider, MD  sitaGLIPtan (JANUVIA) 100 MG tablet Take 100 mg by mouth daily.    Yes Historical Provider, MD  Vitamin D, Ergocalciferol, (DRISDOL) 50000 UNITS CAPS Take 50,000 Units by mouth every Monday, Wednesday, and Friday.     Historical Provider, MD     REVIEW OF SYSTEMS: No change from prior as in history of present illness  PHYSICAL EXAMINATION:  Filed Vitals:   08/22/11 1115  BP: 148/59  Pulse: 83  Temp:   Resp: 18    General: The patient appears their stated age. Pulmonary: There is a good air exchange bilaterally  Abdomen: Soft and non-tender with normal pitch bowel sounds. Musculoskeletal: There are no major deformities.   Neurologic: No focal weakness or paresthesias are detected, Skin: There are no ulcer or rashes noted. Psychiatric: The patient has normal affect. Extremities: Right foot pale and blanched. Diminished motor and sensory function. 1+ right femoral pulse 2+ left femoral pulse.  Diagnostic Studies: CT angiogram reveals occlusion of right femoropopliteal bypass  Outside Studies/Documentation  None none  Medication Changes: None  Assessment:  Right foot ischemia or related to occlusion of right femoropopliteal bypass  Plan: The patient be taken to the operating room urgently for right femoral endarterectomy and thrombectomy and possible redo right femoropopliteal bypass. Of note she has very difficult peripheral access and while in the  emergency department after obtaining consent she did have placement of a right internal jugular vein catheter by myself with ultrasound visualization  Marcellino Fidalgo 2/18/20132:40 PM

## 2011-08-22 NOTE — ED Notes (Signed)
Pt back from IR with PICC line placement in right arm.

## 2011-08-22 NOTE — Progress Notes (Signed)
ANTIBIOTIC CONSULT NOTE - INITIAL  Pharmacy Consult for Antibiotics to be renally adjusted Indication: post-op  Allergies  Allergen Reactions  . Acetaminophen Other (See Comments)    Does not tolerate well  . Codeine Nausea And Vomiting  . Erythromycin Nausea And Vomiting  . Hydromorphone Nausea And Vomiting  . Ibuprofen Other (See Comments)    Does not tolerate well  . Propoxyphene Hcl Nausea And Vomiting  . Statins Other (See Comments)    Leg cramps    Patient Measurements: Height: 5\' 4"  (162.6 cm) Weight: 165 lb (74.844 kg) IBW/kg (Calculated) : 54.7    Vital Signs: Temp: 98.1 F (36.7 C) (02/18 2050) BP: 93/38 mmHg (02/18 2050) Pulse Rate: 66  (02/18 2050) Intake/Output from previous day:   Intake/Output from this shift: Total I/O In: 800 [I.V.:800] Out: 150 [Urine:150]  Labs:  [facility] 08/22/11 0935  WBC 5.7  HGB 13.2  PLT 220  LABCREA --  CREATININE 0.66   Estimated Creatinine Clearance: 68.5 ml/min (by C-G formula based on Cr of 0.66). No results found for this basename: VANCOTROUGH:2,VANCOPEAK:2,VANCORANDOM:2,GENTTROUGH:2,GENTPEAK:2,GENTRANDOM:2,TOBRATROUGH:2,TOBRAPEAK:2,TOBRARND:2,AMIKACINPEAK:2,AMIKACINTROU:2,AMIKACIN:2, in the last 72 hours   Microbiology: No results found for this or any previous visit (from the past 720 hour(s)).  Medical History: Past Medical History  Diagnosis Date  . Hypertension     Unspecified  . Coronary artery disease     a. s/p NSTEMI 02/2009 - PCI LCX with Xience DES. Otherwise branch vessel and Dist. RCA dzs. NL EF.   Marland Kitchen Hyperlipidemia     Mixed  . Diabetes mellitus   . Tobacco abuse     Remote  . Peripheral vascular disease     unspecified, a. s/p L CEA b. s/p B fem-pop bypass  . Loose, teeth     has loose bridge and two loose teeth holding it    Medications:  See med rec  Assessment: 66 yo F s/p thrombectomy of right femoral to popliteal bypass, right external iliac and femoral endarterectomy, and  revision of proximal anastomosis to receive Zinacef post-op.  Renal function adequate for current dosing.  Plan:  Continue Zinacef 1.5gm IV q 12hr x 2 doses as ordered Pharmacy will sign-off, please re-consult for any other medication needs.  Elonda Husky 08/22/2011,9:31 PM

## 2011-08-22 NOTE — ED Notes (Signed)
Dr. Alto Denver using doppler and unable to find pedal pulse in right foot.

## 2011-08-22 NOTE — ED Notes (Signed)
OR desk called and made aware pt just now going over for CT scan

## 2011-08-22 NOTE — ED Notes (Signed)
IV attempt x1 unsuccessful. Dr. Alto Denver made aware. Charge nurse in to attempt and IV team paged.

## 2011-08-22 NOTE — Anesthesia Preprocedure Evaluation (Addendum)
Anesthesia Evaluation  Patient identified by MRN, date of birth, ID band Patient awake    Reviewed: Allergy & Precautions, H&P , NPO status , Patient's Chart, lab work & pertinent test results  Airway Mallampati: II TM Distance: >3 FB Neck ROM: Full    Dental   Pulmonary    Pulmonary exam normal       Cardiovascular hypertension, + CAD     Neuro/Psych    GI/Hepatic   Endo/Other  Diabetes mellitus-  Renal/GU      Musculoskeletal   Abdominal   Peds  Hematology   Anesthesia Other Findings   Reproductive/Obstetrics                           Anesthesia Physical Anesthesia Plan  ASA: III and Emergent  Anesthesia Plan: General   Post-op Pain Management:    Induction: Intravenous  Airway Management Planned: Oral ETT  Additional Equipment:   Intra-op Plan:   Post-operative Plan: Extubation in OR  Informed Consent: I have reviewed the patients History and Physical, chart, labs and discussed the procedure including the risks, benefits and alternatives for the proposed anesthesia with the patient or authorized representative who has indicated his/her understanding and acceptance.     Plan Discussed with: CRNA and Surgeon  Anesthesia Plan Comments:         Anesthesia Quick Evaluation

## 2011-08-22 NOTE — ED Notes (Signed)
Dr. Arbie Cookey at bedside to place central line

## 2011-08-22 NOTE — Consults (Addendum)
[facility] [place] ADMIT/CONSULT NOTE 08/22/2011 [id number]  CC:This is a 67 year old female with an extensive history of PAD and multiple by pass surgeries.  She woke up this am with right lower extremity pain with activity and at rest.. Referring Physician ER History of Present Illness: This is a 66 year old female with an extensive history of PAD and multiple by pass surgeries.  She woke up this am with right lower extremity pain with activity and at rest.  She has reported decreased motion and decreased sensation of the right foot.  Her toes and foot are cold and white in color.  She took a full strength Asprin this morning and her regular medications with a few sips of water at 7am.     Past Medical History  Diagnosis Date  . Hypertension     Unspecified  . Coronary artery disease     a. s/p NSTEMI 02/2009 - PCI LCX with Xience DES. Otherwise branch vessel and Dist. RCA dzs. NL EF.   Marland Kitchen Hyperlipidemia     Mixed  . Diabetes mellitus   . Tobacco abuse     Remote  . Peripheral vascular disease     unspecified, a. s/p L CEA b. s/p B fem-pop bypass  . Loose, teeth     has loose bridge and two loose teeth holding it    Past Surgical History  Procedure Date  . Coronary artery bypass graft 2004  . Vesicovaginal fistula closure w/ tah   . Breast lumpectomy   . Colonoscopy 11/2010  . Upper gastrointestinal endoscopy 11/2010  . Femoral artery - popliteal artery bypass graft   . Tonsillectomy   . Abdominal hysterectomy     complete  . Coronary angioplasty with stent placement      ROS: [x]  Positive  [ ]  Denies    General: [ ]  Weight loss, [ ]  Fever, [ ]  chills Neurologic: [ ]  Dizziness, [ ]  Blackouts, [ ]  Seizure [ ]  Stroke, [ ]  "Mini stroke", [ ]  Slurred speech, [ ]  Temporary blindness; [ ]  weakness in arms or legs, [ ]  Hoarseness Cardiac: [ ]  Chest pain/pressure, [ ]  Shortness of breath at rest [ ]  Shortness of breath with exertion, [ ]  Atrial  fibrillation or irregular heartbeat Vascular: [ ]  Pain in legs with walking, [ ]  Pain in legs at rest, [ ]  Pain in legs at night,  [ ]  Non-healing ulcer, [ ]  Blood clot in vein/DVT,   Pulmonary: [ ]  Home oxygen, [ ]  Productive cough, [ ]  Coughing up blood, [ ]  Asthma,  [ ]  Wheezing Musculoskeletal:  [ ]  Arthritis, [ ]  Low back pain, [ ]  Joint pain Hematologic: [ ]  Easy Bruising, [ ]  Anemia; [ ]  Hepatitis Gastrointestinal: [ ]  Blood in stool, [ ]  Gastroesophageal Reflux/heartburn, [ ]  Trouble swallowing Urinary: [ ]  chronic Kidney disease, [ ]  on HD - [ ]  MWF or [ ]  TTHS, [ ]  Burning with urination, [ ]  Difficulty urinating Skin: [ ]  Rashes, [ ]  Wounds Psychological: [ ]  Anxiety, [ ]  Depression  Social History History  Substance Use Topics  . Smoking status: Former Smoker    Types: Cigarettes    Quit date: 07/05/2001  . Smokeless tobacco: Not on file  . Alcohol Use: 7.0 oz/week    14 drink(s) per week    Family History Family History  Problem Relation Age of Onset  . Heart attack Mother   . Heart attack Father   .  Hypertension Brother   . Diabetes Brother   . Hypertension Brother     Allergies  Allergen Reactions  . Acetaminophen Other (See Comments)    Does not tolerate well  . Codeine Nausea And Vomiting  . Erythromycin Nausea And Vomiting  . Hydromorphone Nausea And Vomiting  . Ibuprofen Other (See Comments)    Does not tolerate well  . Propoxyphene Hcl Nausea And Vomiting  . Statins Other (See Comments)    Leg cramps    No current facility-administered medications for this encounter.   Current Outpatient Prescriptions  Medication Sig Dispense Refill  . ALPRAZolam (XANAX) 0.25 MG tablet Take 0.25 mg by mouth daily.       Marland Kitchen aspirin 81 MG tablet Take 81 mg by mouth daily.       . calcium carbonate (TUMS) 500 MG chewable tablet Chew 1-4 tablets by mouth as needed. For acid reflux      . clopidogrel (PLAVIX) 75 MG tablet Take 75 mg by mouth daily.      Marland Kitchen  dexlansoprazole (DEXILANT) 60 MG capsule Take 60 mg by mouth 2 (two) times daily.      Marland Kitchen glyBURIDE (DIABETA) 2.5 MG tablet Take 2.5 mg by mouth daily.       . hydrochlorothiazide 25 MG tablet Take 25 mg by mouth daily.       . iron polysaccharides (NIFEREX) 150 MG capsule Take 150 mg by mouth 2 (two) times daily.       . isosorbide mononitrate (IMDUR) 30 MG 24 hr tablet Take 15 mg by mouth 2 (two) times daily. Take 1/2 tablet by mouth every morning and every evening      . metoprolol succinate (TOPROL-XL) 25 MG 24 hr tablet Take 25 mg by mouth daily.       . nitroGLYCERIN (NITROSTAT) 0.4 MG SL tablet Place 0.4 mg under the tongue every 5 (five) minutes as needed. For chest pain      . omega-3 acid ethyl esters (LOVAZA) 1 G capsule Take 2 g by mouth 2 (two) times daily.       . pioglitazone-metformin (ACTOPLUS MET) 15-500 MG per tablet Take 1 tablet by mouth daily. Brand name only-does not work if components are given separately per patient      . ramipril (ALTACE) 2.5 MG capsule Take 1.25 mg by mouth daily.       . sitaGLIPtan (JANUVIA) 100 MG tablet Take 100 mg by mouth daily.       . Vitamin D, Ergocalciferol, (DRISDOL) 50000 UNITS CAPS Take 50,000 Units by mouth every Monday, Wednesday, and Friday.          Imaging: No results found.  Significant Diagnostic Studies: CBC Lab Results  Component Value Date   WBC 7.5 09/07/2010   HGB 13.3 05/17/2011   HCT 39.0 05/17/2011   MCV 79.4 09/07/2010   PLT 229 09/07/2010    BMET    Component Value Date/Time   NA 139 05/17/2011 1109   K 3.8 05/17/2011 1109   CL 103 05/17/2011 1109   CO2 28 09/07/2010 0518   GLUCOSE 114* 05/17/2011 1109   BUN 16 05/17/2011 1109   CREATININE 0.90 05/17/2011 1109   CALCIUM 8.0* 09/07/2010 0518   GFRNONAA >60 09/07/2010 0518   GFRAA  Value: >60        The eGFR has been calculated using the MDRD equation. This calculation has not been validated in all clinical situations. eGFR's persistently <60 mL/min signify possible  Chronic Kidney  Disease. 09/07/2010 0518    COAG Lab Results  Component Value Date   INR 0.95 09/06/2010   INR 0.89 07/09/2010   INR 1.00 11/04/2009   No results found for this basename: PTT     Physical Examination  Patient Vitals for the past 24 hrs:  BP Temp Temp src Pulse Resp SpO2 Height Weight  08/22/11 0815 169/42 mmHg 98.2 F (36.8 C) Oral 91  21  93 % 5\' 4"  (1.626 m) 165 lb (74.844 kg)   Pulse Readings from Last 3 Encounters:  08/22/11 91  08/09/11 78  06/07/11 90    General:  WDWN in NAD Gait: Normal HENT: WNL Eyes: Pupils equal Pulmonary: normal non-labored breathing , without Rales, rhonchi,  wheezing Cardiac: RRRAbdomen: soft, NT, no masses Skin: no rashes, ulcers noted Vascular Exam/Pulses: Palp femoral left pulses and doppler PT left pulse.   Right LE non palp femoral pulse, unable to doppler femoral, popliteal nor DP/PT pulses.  Skin is warm to touch from the knee and up. Right toes and foot are cold to touch with no cap refill.  Decreased motor of toes, intact ankle motor equal bil.   Musculoskeletal: no muscle wasting or atrophy  Neurologic: A&O X 3; Appropriate Affect ;   Speech is fluent/normal  Non-Invasive Vascular Imaging: Labs are pending. ASSESSMENT:  Will discuss operative plan with Dr. Arbie Cookey. PLAN:

## 2011-08-22 NOTE — ED Notes (Addendum)
Pt sts woke up this morning at 0200 and felt numbness in the right leg from knee down to the foot. Hx of 4 fem-pops, doppler 10 days ago in office and sts the numbers were not good but wanted to wait. Talked to Dr. Arbie Cookey

## 2011-08-22 NOTE — Op Note (Signed)
OPERATIVE REPORT  DATE OF SURGERY: 08/22/2011  PATIENT: [name], 66 y.o. female MRN: [id number]  DOB: 12/02/1945  PRE-OPERATIVE DIAGNOSIS: Right foot ischemia with occluded right femoropopliteal bypass  POST-OPERATIVE DIAGNOSIS:  Same  PROCEDURE: Thrombectomy of right femoral to popliteal bypass, right external iliac and femoral endarterectomy, revision of proximal anastomosis  SURGEON:  Gretta Began, M.D.  PHYSICIAN ASSISTANT: [place]  ANESTHESIA:  Gen.  EBL: 300 ml     BLOOD ADMINISTERED: None  DRAINS: None  SPECIMEN: Endarterectomy specimen  COUNTS CORRECT:  YES  PLAN OF CARE: PACU   PATIENT DISPOSITION:  PACU - hemodynamically stable  PROCEDURE DETAILS: Indication for the procedure: Patient is a 66 year old female with multiple prior bilateral revascularizations. She presented the morning of the procedure with profound ischemia of her right foot. She had prior right femoral-popliteal bypass with vein that occluded as we underwent a right femoral to above-knee popliteal bypass. She had recurrent ischemia after that time arteriogram revealed occlusion of her popliteal artery. She underwent revision with a jump femoral bypass to the below-knee popliteal artery. His also had bilateral iliac stenting with extending with extensive stenting in her right common and external iliac. She had known severe plaque in her common femoral artery on the right. She had a diminished right femoral pulse 2+ left femoral pulse. CT angiogram revealed patency of her iliac system but occlusion of her femoropopliteal bypass. She is taken to the operating room for revision.  Procedure in detail: The patient was taken to the operating room placed supine position where the area of the right groin and right leg were prepped in the sterile fashion. An incision was made in the prior scar in the popliteal below knee exposure on the medial aspect. This is carried down to isolate the graft. The incision  was made to the prior groin incision is well and carried down to isolate the common femoral artery. This was extensively calcified and dissection was continued up onto the external iliac above the inguinal ligament. The superficial femoral had been ligated in the past and the profundus femoris was isolated. There were 2 large profunda vessels and these were both isolated. The patient had collateral vessels coming off the common and external iliac artery these worse circle with 20 Potts tie and preserved. The graft was also exposed at the femoral anastomosis. The graft was then opened transversely near the below-knee popliteal anastomosis. A Fogarty catheter was passed up to the level of the groin anastomosis and the clot was removed. There was inflow with arterialized red blood. The patient had been given 7000 units of heparin. The graft was flushed with heparinized and reoccluded. The popliteal anastomosis was thrombectomized as well. To have dilator passed through the anastomosis. The patient had single-vessel runoff via peroneal artery by prior arteriogram. The graft was reoccluded in the incision the graft was closed with a running 6-0 Prolene suture. Clamps were removed from the graft and it was restored. Next attention was turned to the groin incision again. The internal iliac was isolated as far as could be reached under the inguinal ligament was occluded with a Hanley clamp. It was extensively calcified at this level. The old anastomosis and the groin was taken down. There was extensive plaque throughout the common femoral artery extending into the profundus femoris artery. This was endarterectomized including an eversion endarterectomy up under the inguinal ligament. There still was plaque present and therefore a Fogarty catheter was passed with a stopcock. The balloon was inflated and the stopcock  was locked to get control further plaque was removed. Next endarterectomy was accomplished. A 7 mm Gore-Tex  graft was brought onto the field and was spatulated using this as a long patch. This was sewn as a patch angioplasty over the resulting endarterectomy. The toe of the graft was then the profundus and it up onto the external iliac artery. After this was completed the anastomosis completed and found to be adequate with the lesion of the distal graft. This was then brought into approximation with the prior femoropopliteal bypass. This interposition graft was cut to appropriate length and sewn end-to-end to the old femoropopliteal bypass with a running 6-0 Prolene suture. Clamps were removed and Doppler dependent graft dependent flow was noted at the peroneal artery at the ankle. The patient was given 100 mg of protamine to reverse the heparin. Hemostasis was obtained with electrocautery. The wounds were irrigated with saline. Wounds were closed with 2-0 Vicryl in the subcutaneous tissue and fascia the skin was closed with 3-0 subcuticular Vicryl stitch. The patient was extubated and transferred to the recovery room in stable condition   Gretta Began, M.D. 08/22/2011 7:27 PM

## 2011-08-22 NOTE — ED Notes (Signed)
MD at bedside. 

## 2011-08-22 NOTE — Procedures (Signed)
DL power PICC Inserted without complication  34 cm in length Tip in SVC/RA jxn  Ready to use

## 2011-08-23 ENCOUNTER — Encounter (HOSPITAL_COMMUNITY): Payer: Self-pay | Admitting: Vascular Surgery

## 2011-08-23 LAB — GLUCOSE, CAPILLARY
Glucose-Capillary: 146 mg/dL — ABNORMAL HIGH (ref 70–99)
Glucose-Capillary: 233 mg/dL — ABNORMAL HIGH (ref 70–99)
Glucose-Capillary: 210 mg/dL — ABNORMAL HIGH (ref 70–99)

## 2011-08-23 LAB — BASIC METABOLIC PANEL
BUN: 10 mg/dL (ref 6–23)
CO2: 28 mEq/L (ref 19–32)
Chloride: 108 mEq/L (ref 96–112)
Glucose, Bld: 128 mg/dL — ABNORMAL HIGH (ref 70–99)
Potassium: 3.8 mEq/L (ref 3.5–5.1)
Sodium: 142 mEq/L (ref 135–145)

## 2011-08-23 LAB — CBC
HCT: 28.8 % — ABNORMAL LOW (ref 36.0–46.0)
Hemoglobin: 9.4 g/dL — ABNORMAL LOW (ref 12.0–15.0)
RBC: 3.18 MIL/uL — ABNORMAL LOW (ref 3.87–5.11)

## 2011-08-23 LAB — HEMOGLOBIN A1C: Mean Plasma Glucose: 148 mg/dL — ABNORMAL HIGH (ref <117)

## 2011-08-23 MED ORDER — PNEUMOCOCCAL VAC POLYVALENT 25 MCG/0.5ML IJ INJ
0.5000 mL | INJECTION | INTRAMUSCULAR | Status: DC
  Filled 2011-08-23: qty 0.5

## 2011-08-23 MED ORDER — SODIUM CHLORIDE 0.9 % IJ SOLN
INTRAMUSCULAR | Status: AC
  Administered 2011-08-23: 10 mL
  Filled 2011-08-23: qty 20

## 2011-08-23 MED ORDER — PROMETHAZINE HCL 25 MG/ML IJ SOLN: 6.2500 mg | INTRAMUSCULAR | Status: DC | PRN

## 2011-08-23 MED ORDER — TRAMADOL HCL 50 MG PO TABS: 50.0000 mg | ORAL_TABLET | Freq: Four times a day (QID) | ORAL | Status: DC | PRN

## 2011-08-23 NOTE — Progress Notes (Signed)
Dr. Arbie Cookey at pt's bedside. R peroneal and R posterior tibial pulse heard with doppler. Will continue to monitor.  Ralston Venus, [place] Men[place] RN

## 2011-08-23 NOTE — Progress Notes (Signed)
Unable to doppler Pt's R pedal pulse. Dr. Arbie Cookey called. No new orders received. Will continue to monitor.  Leasha Goldberger, [place] Men[place] RN

## 2011-08-23 NOTE — Clinical Documentation Improvement (Signed)
Anemia Blood Loss Clarification  THIS DOCUMENT IS NOT A PERMANENT PART OF THE MEDICAL RECORD  RESPOND TO THE THIS QUERY, FOLLOW THE INSTRUCTIONS BELOW:  1. If needed, update documentation for the patient's encounter via the notes activity.  2. Access this query again and click edit on the In [employer].  3. After updating, or not, click F2 to complete all highlighted (required) fields concerning your review. Select "additional documentation in the medical record" OR "no additional documentation provided".  4. Click Sign note button.  5. The deficiency will fall out of your In Basket *Please let us know if you are not able to complete this workflow by phone or e-mail (listed below).        08/23/11  Dear [place] Marton Redwood  In an effort to better capture your patient's severity of illness, reflect appropriate length of stay and utilization of resources, a review of the patient medical record has revealed the following indicators.    Based on your clinical judgment, please clarify and document in a progress note and/or discharge summary the clinical condition associated with the following supporting information:   Possible Clinical Conditions?   " Expected Acute Blood Loss Anemia  " Acute Blood Loss Anemia  " Acute on chronic blood loss anemia  " Chronic blood loss anemia  " Precipitous drop in Hematocrit  " Other Condition________________  " Cannot Clinically Determine    Supporting Information: EBL: per Anesthesia record.  Pre-OP H&H: 2/18: 13.2/38.6 Post OP H&H: 2/19:  9.4/28.8  IV fluids / plasma expanders: 2/18:  Hetastarch 6% in Lactacted electrolytes(Hextend)  Reviewed:   No additional documentation noted.        Thank You,  Marciano Sequin,  Clinical Documentation Specialist:  Pager: [phone]  Health Information Management [place]

## 2011-08-23 NOTE — Progress Notes (Signed)
Subjective: Interval History: none.Marland Kitchen Up in chair. No pain or numbness Objective: Vital signs in last 24 hours: Temp:  [97.4 F (36.3 C)-98.2 F (36.8 C)] 97.6 F (36.4 C) (02/18 2315) Pulse Rate:  [66-92] 80  (02/18 2146) Resp:  [14-22] 16  (02/19 0449) BP: (93-169)/(38-99) 116/68 mmHg (02/19 0449) SpO2:  [92 %-99 %] 94 % (02/19 0449) Weight:  [165 lb (74.844 kg)-173 lb 8 oz (78.7 kg)] 173 lb 8 oz (78.7 kg) (02/18 2146)  Intake/Output from previous day: 02/18 0701 - 02/19 0700 In: 4780 [P.O.:360; I.V.:3920; IV Piggyback:500] Out: 1650 [Urine:1300; Blood:350] Intake/Output this shift:    General appearance: alert and mild distress Extremities: audible right peroneal,at and pt signal.  Foot pink and warm. Normal motor and sensory  Lab Results:  Basename 08/23/11 0447 08/22/11 0935  WBC 6.2 5.7  HGB 9.4* 13.2  HCT 28.8* 38.6  PLT 190 220   BMET  Basename 08/23/11 0447 08/22/11 0935  NA 142 142  K 3.8 3.8  CL 108 104  CO2 28 26  GLUCOSE 128* 140*  BUN 10 17  CREATININE 0.61 0.66  CALCIUM 8.3* 9.7    Studies/Results: Ct Angio Ao+bifem W/cm &/or Wo/cm  08/22/2011  *RADIOLOGY REPORT*  Clinical Data:  Decreased circulation and severe pain in the right foot.  History of vascular surgery.  CT ANGIOGRAPHY OF ABDOMINAL AORTA WITH ILIOFEMORAL RUNOFF  Technique:  Multidetector CT imaging of the abdomen, pelvis and lower extremities was performed using the standard protocol during bolus administration of intravenous contrast.  Multiplanar CT image reconstructions including MIPs were obtained to evaluate the vascular anatomy.  Contrast: OMNIPAQUE IOHEXOL 350 MG/ML IV SOLN  Comparison:  Abdominal ultrasound 11/04/2009  Findings:  Aorta:  The abdominal aorta is diffusely diseased with calcified and noncalcified plaque.  Plaque and narrowing involving the renal arteries bilaterally.  There is an inferior accessory left renal artery.  Abdominal aorta measures 1.9 cm without  aneurysmal dilatation or dissection.  The inferior mesenteric artery is patent.  The common iliac arteries are heavily calcified but patent.  There is close to 50% narrowing in the left common iliac artery.  Right Lower Extremity:  The right common iliac artery is patent. The right external iliac artery is heavily calcified and there is a stent in the distal external iliac artery. The right external iliac artery stent is patent but narrowed.   The right common femoral artery is heavily calcified and diseased.  The origin of the right femoral bypass graft is patent but it occludes just beyond the origin.  There is flow within the deep femoral arteries.  Limited evaluation of the right runoff vessels due to calcified vessels. There is flow in the narrowed distal popliteal artery and peroneal artery.  Peroneal artery appears to be only runoff in the right lower extremity.  Left Lower Extremity:  Close to 50% stenosis in the left common iliac artery.  The left external iliac artery is heavily calcified and there may be a focal stenosis in the lower pelvis.  There is stenosis involving the left common femoral artery.  The patient has a left femoral bypass graft which is patent.  The native superficial femoral artery is occluded.  There is collateral flow into the deep left femoral branches.  The appears to be stenosis in the popliteal artery near the graft anastomosis.  Limited evaluation of the left runoff vessels due to venous contamination. The left posterior tibial artery appears be patent. Probable flow within the peroneal  artery.  Anterior tibial artery is heavily calcified.  There is flow in the dorsalis pedis artery and posterior tibial artery at the ankle.  Nonvascular structures:  Lung bases are clear.  No evidence of free air.   9 mm low density structure in the lateral left hepatic lobe is nonspecific but could represent a cyst.  No gross abnormality of the liver, spleen, pancreas and right kidney. There is  massive dilatation of the left renal pelvis and collecting system.  This finding is similar to the prior ultrasound.  The left ureter is not dilated and suggestive for a chronic UPJ obstruction.  There are multiple calcifications in the kidney most compatible with vascular calcifications.  There is a 4 mm calcification in the left kidney suggestive for a stone.  The patient has a ventral hernia containing fat.  Uterus has been removed.  Diverticulosis of the colon without acute inflammatory change.  Normal appearance of the appendix. No acute bony abnormalities.  Review of the MIP images confirms the above findings.  IMPRESSION: Diffuse atherosclerotic disease involving the inflow, outflow or runoff vessels bilaterally. The right femoral- popliteal bypass graft is occluded.  There appears to be single vessel runoff in the right calf.   Concern for severe inflow disease involving the right external iliac artery and right common femoral artery.  Left femoral-popliteal graft is patent with segmental areas of stenosis in the left lower extremity.  Atherosclerotic disease in the aorta with stenosis and plaque in the renal arteries.  Chronic dilatation of the left renal collecting system consistent with a UPJ obstruction or stenosis.  4 mm left kidney stone.  Original Report Authenticated By: Richarda Overlie, M.D.   Dg Chest Port 1 View  08/22/2011  *RADIOLOGY REPORT*  Clinical Data: Right-sided central line placement.  PORTABLE CHEST - 1 VIEW  Comparison: 09/06/2010.  Findings: Trachea is midline.  Heart size normal.  Right IJ central line tip projects over the SVC. There may be a calcified granuloma in the right midlung zone.  Lungs are otherwise clear.  No pleural fluid.  No pneumothorax.  IMPRESSION: No acute findings.  Original Report Authenticated By: Reyes Ivan, M.D.   Anti-infectives: Anti-infectives     Start     Dose/Rate Route Frequency Ordered Stop   08/22/11 2200   cefUROXime (ZINACEF) 1.5 g in  dextrose 5 % 50 mL IVPB        1.5 g 100 mL/hr over 30 Minutes Intravenous Every 12 hours 08/22/11 2126 08/23/11 2159   08/22/11 1039   cefUROXime (ZINACEF) 1.5 g in dextrose 5 % 50 mL IVPB        1.5 g 100 mL/hr over 30 Minutes Intravenous On call to O.R. 08/22/11 1041 08/22/11 1529          Assessment/Plan: s/p Procedure(s) (LRB): BYPASS GRAFT FEMORAL-POPLITEAL ARTERY (Right) INTRA OPERATIVE ARTERIOGRAM (Right) ENDARTERECTOMY FEMORAL (Right) Transfer to 2000. dc foley. Keep central line. Mobilize. Possible dc in am   LOS: 1 day   Adrijana Haros 08/23/2011, 7:19 AM

## 2011-08-24 ENCOUNTER — Other Ambulatory Visit: Payer: Self-pay | Admitting: *Deleted

## 2011-08-24 ENCOUNTER — Other Ambulatory Visit: Payer: Self-pay | Admitting: Vascular Surgery

## 2011-08-24 DIAGNOSIS — Z95828 Presence of other vascular implants and grafts: Secondary | ICD-10-CM

## 2011-08-24 DIAGNOSIS — I70219 Atherosclerosis of native arteries of extremities with intermittent claudication, unspecified extremity: Secondary | ICD-10-CM

## 2011-08-24 DIAGNOSIS — Z48812 Encounter for surgical aftercare following surgery on the circulatory system: Secondary | ICD-10-CM

## 2011-08-24 LAB — POCT I-STAT GLUCOSE: Operator id: 173792

## 2011-08-24 MED ORDER — MEPERIDINE HCL 50 MG PO TABS: 50.0000 mg | ORAL_TABLET | Freq: Four times a day (QID) | ORAL | Status: AC | PRN

## 2011-08-24 MED ORDER — MEPERIDINE HCL 50 MG PO TABS: 50.0000 mg | ORAL_TABLET | ORAL | Status: DC | PRN

## 2011-08-24 MED ORDER — PANTOPRAZOLE SODIUM 40 MG PO TBEC: 40.0000 mg | DELAYED_RELEASE_TABLET | Freq: Two times a day (BID) | ORAL | Status: DC

## 2011-08-24 NOTE — Progress Notes (Signed)
Picc dc'cd  Tip intact.  Pressure held x 15 min .  No active bleeding from site.  Vaseline guaze pressure drsg applied.  Pt told to lay flat for 30 min and to leave drsg in place for 24 hr.

## 2011-08-24 NOTE — Progress Notes (Signed)
*  PRELIMINARY RESULTS* Vascular Ultrasound Lower Extremity Arterial Doppler has been completed.    VASCULAR LAB PRELIMINARY  PRELIMINARY  PRELIMINARY  PRELIMINARY  ARTERIAL  ABI completed: Right ABI shows severe disease. Left ABI is in normal range.    RIGHT    LEFT    PRESSURE WAVEFORM  PRESSURE WAVEFORM  BRACHIAL Not obtained due to IV Tri BRACHIAL 155 Tri  DP   DP    PT 71 Dampened mono PT 169 Bi  AT 66 Mono AT 129 Mono  PER   PER    GREAT TOE 50 Abnormal GREAT TOE      RIGHT LEFT  ABI 0.46 1.09     Farrel Demark RDMS 08/24/2011, 11:37 AM

## 2011-08-24 NOTE — Progress Notes (Signed)
Nurse attempted to administer patient's 0700 Metformin and Actos. Patient refused the 0700 dose. Harmon Pier

## 2011-08-24 NOTE — Progress Notes (Signed)
Utilization review completed. Yared Susan, RN, BSN. 08/24/11 

## 2011-08-26 NOTE — Discharge Planning (Signed)
[facility] Discharge Summary   Patient ID:  [name] MRN: [id number] DOB/AGE: July 08, 1945 66 y.o.  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 Hypertension     Unspecified  . Coronary artery disease     a. s/p NSTEMI 02/2009 - PCI LCX with Xience DES. Otherwise branch vessel and Dist. RCA dzs. NL EF.   Marland Kitchen Hyperlipidemia     Mixed  . Diabetes mellitus   . Tobacco abuse     Remote  . Peripheral vascular disease     unspecified, a. s/p L CEA b. s/p B fem-pop bypass  . Loose, teeth     has loose bridge and two loose teeth holding it    Procedure(s): BYPASS GRAFT FEMORAL-POPLITEAL ARTERY INTRA OPERATIVE ARTERIOGRAM ENDARTERECTOMY FEMORAL  Discharged Condition: good  HPI: The patient presents today with onset this morning of coolness numbness and pain in her right foot. She has an extensive past history with a 8 year history of superficial femoral artery stenting and subsequent bypass. 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 This was treated on the right with stenting she did have narrowing in the common femoral artery. She had been walking with typical claudication pain until early this morning when she was awakened with numbness and pain     Hospital Course:  [name] is a 66 y.o. female is S/P Right Procedure(s): BYPASS GRAFT FEMORAL-POPLITEAL ARTERY INTRA OPERATIVE ARTERIOGRAM ENDARTERECTOMY FEMORAL Extubated: POD # 0 Post-op wounds clean, dry, intact or healing well Pt. Ambulating, voiding and taking PO diet without difficulty. Pt pain controlled with PO pain meds. Labs as  below Complications:none  Consults:  Treatment Team:  Gretta Began, MD  Significant Diagnostic Studies: CBC Lab Results  Component Value Date   WBC 6.2 08/23/2011   HGB 9.4* 08/23/2011   HCT 28.8* 08/23/2011   MCV 90.6 08/23/2011   PLT 190 08/23/2011    BMET    Component Value Date/Time   NA 142 08/23/2011 0447   K 3.8 08/23/2011 0447   CL 108 08/23/2011 0447   CO2 28 08/23/2011 0447   GLUCOSE 128* 08/23/2011 0447   BUN 10 08/23/2011 0447   CREATININE 0.61 08/23/2011 0447   CALCIUM 8.3* 08/23/2011 0447   GFRNONAA >90 08/23/2011 0447   GFRAA >90 08/23/2011 0447   COAG Lab Results  Component Value Date   INR 0.91 08/22/2011   INR 0.95 09/06/2010   INR 0.89 07/09/2010     Disposition:  Discharge to :Home Discharge Orders    Future Appointments: Provider: Department: Dept Phone: Center:   09/13/2011 10:30 AM [facility] [phone] [facility]   09/13/2011 11:00 AM [facility] [phone] [facility]   09/13/2011 11:45 AM Gretta Began, MD [facility] [phone] [facility]   01/17/2012 9:30 AM [facility] [phone] [facility]   01/17/2012 10:30 AM [facility] [phone] [facility]   01/17/2012 11:00 AM [facility] [phone] [facility]   01/17/2012 11:30 AM Gretta Began, MD [facility] [phone] [facility]     Future Orders Please Complete By Expires   Resume previous diet      Driving Restrictions      Comments:   No driv4ing for 4  weeks   Lifting restrictions      Comments:   No lifting for 4 weeks   Call MD for:  temperature >100.5      Call MD for:  redness, tenderness, or signs of infection (pain, swelling, bleeding, redness, odor or green/yellow discharge around incision site)      Call MD for:  severe or increased pain, loss or decreased feeling  in affected limb(s)      Discharge instructions      Comments:   Tooth extraction is ok at any time and the patient needs antibiotic coverage for endocarditis type coverage.       [name], [name]  Home Medication Instructions ZOX:[id number]   Printed on:08/26/11 1203  Medication Information                    pioglitazone-metformin (ACTOPLUS MET) 15-500 MG per tablet Take 1 tablet by mouth daily. Brand name only-does not work if components are given separately per patient           ALPRAZolam (XANAX) 0.25 MG tablet Take 0.25 mg by mouth daily.            aspirin 81 MG tablet Take 81 mg by mouth daily.            glyBURIDE (DIABETA) 2.5 MG tablet Take 2.5 mg by mouth daily.            hydrochlorothiazide 25 MG tablet Take 25 mg by mouth daily.            isosorbide mononitrate (IMDUR) 30 MG 24 hr tablet Take 15 mg by mouth 2 (two) times daily. Take 1/2 tablet by mouth every morning and every evening           sitaGLIPtan (JANUVIA) 100 MG tablet Take 100 mg by mouth daily.            omega-3 acid ethyl esters (LOVAZA) 1 G capsule Take 2 g by mouth 2 (two) times daily.            nitroGLYCERIN (NITROSTAT) 0.4 MG SL tablet Place 0.4 mg under the tongue every 5 (five) minutes as needed. For chest pain           ramipril (ALTACE) 2.5 MG capsule Take 1.25 mg by mouth daily.            metoprolol succinate (TOPROL-XL) 25 MG 24 hr tablet Take 25 mg by mouth daily.            calcium carbonate (TUMS) 500 MG chewable tablet Chew 1-4 tablets by mouth as needed. For acid reflux           iron polysaccharides (NIFEREX) 150 MG capsule Take 150 mg by mouth 2 (two) times daily.            dexlansoprazole (DEXILANT) 60 MG capsule Take 60 mg by mouth 2 (two) times daily.           Vitamin D, Ergocalciferol, (DRISDOL) 50000 UNITS CAPS Take 50,000 Units by mouth every Monday, Wednesday, and Friday.            clopidogrel (PLAVIX) 75 MG tablet Take 75 mg by mouth daily.           meperidine (DEMEROL) 50 MG tablet Take 1 tablet (50 mg total) by mouth every 6 (six) hours as needed for pain.            Verbal and written Discharge instructions given to the  patient. Wound  care per Discharge AVS Follow-up Information    Follow up with Julian Hy, MD .         Signed: Clinton Gallant [facility] 08/26/2011, 12:03 PM

## 2011-08-29 HISTORY — PX: MULTIPLE TOOTH EXTRACTIONS: SHX2053

## 2011-09-06 ENCOUNTER — Ambulatory Visit (INDEPENDENT_AMBULATORY_CARE_PROVIDER_SITE_OTHER): Payer: Medicare Other | Admitting: Vascular Surgery

## 2011-09-06 ENCOUNTER — Encounter: Payer: Self-pay | Admitting: Vascular Surgery

## 2011-09-06 VITALS — BP 166/57 | HR 75 | Resp 16 | Ht 64.0 in | Wt 160.0 lb

## 2011-09-06 DIAGNOSIS — I70219 Atherosclerosis of native arteries of extremities with intermittent claudication, unspecified extremity: Secondary | ICD-10-CM | POA: Insufficient documentation

## 2011-09-06 DIAGNOSIS — I6529 Occlusion and stenosis of unspecified carotid artery: Secondary | ICD-10-CM

## 2011-09-06 NOTE — Progress Notes (Signed)
Subjective:     Patient ID: [name], female   DOB: 09-12-1945, 66 y.o.   MRN: [id number]  HPI this 66 year old female had thrombectomy and revision of right femoral-popliteal bypass graft by Dr. early on February 18. Today she noted some slight bloody drainage from the right popliteal wound was seen today for evaluation. She denies any chills fever or purulent drainage.  Review of Systems     Objective:   Physical ExamBP 166/57  Pulse 75  Resp 16  Ht 5\' 4"  (1.626 m)  Wt 160 lb (72.576 kg)  BMI 27.46 kg/m2  SpO2 100% General alert and oriented x3 in no apparent distress Right leg has 3+ femoral and 2+ popliteal graft pulse palpable. Right foot is well-perfused. The right popliteal wound with a subcuticular closure has an area about 1 cm in length where there was a very slight amount of superficial clot but no erythema or purulent drainage. There is no tenderness. This does not appear infected.    Assessment:     Slight wound separation right popliteal wound status post femoral-popliteal thrombectomy and revision    Plan:     Keflex 500 mg by mouth 3 times a day for one week and return to see Dr. early next week as scheduled. If patient develops redness, chills, fever, or purulent drainage she will be in touch with [place]

## 2011-09-12 ENCOUNTER — Encounter: Payer: Self-pay | Admitting: Vascular Surgery

## 2011-09-13 ENCOUNTER — Encounter (INDEPENDENT_AMBULATORY_CARE_PROVIDER_SITE_OTHER): Payer: Medicare Other | Admitting: *Deleted

## 2011-09-13 ENCOUNTER — Encounter: Payer: Self-pay | Admitting: Vascular Surgery

## 2011-09-13 ENCOUNTER — Ambulatory Visit (INDEPENDENT_AMBULATORY_CARE_PROVIDER_SITE_OTHER): Payer: Medicare Other | Admitting: Vascular Surgery

## 2011-09-13 VITALS — BP 172/53 | HR 77 | Resp 16 | Ht 64.0 in | Wt 161.6 lb

## 2011-09-13 DIAGNOSIS — Z95828 Presence of other vascular implants and grafts: Secondary | ICD-10-CM

## 2011-09-13 DIAGNOSIS — I70219 Atherosclerosis of native arteries of extremities with intermittent claudication, unspecified extremity: Secondary | ICD-10-CM

## 2011-09-13 DIAGNOSIS — I6529 Occlusion and stenosis of unspecified carotid artery: Secondary | ICD-10-CM

## 2011-09-13 DIAGNOSIS — Z48812 Encounter for surgical aftercare following surgery on the circulatory system: Secondary | ICD-10-CM

## 2011-09-13 DIAGNOSIS — I739 Peripheral vascular disease, unspecified: Secondary | ICD-10-CM

## 2011-09-13 NOTE — Progress Notes (Signed)
Patient has today for followup of thrombectomy revision of her right femoral to popliteal bypass on 08/22/2011. He reports no rest pain in her right foot. The pain with acute occlusion of her femoropopliteal has resolved. She did have an ulceration on the medial aspect of her right great toe and this is somewhat worse following surgery.  Physical exam: Well-healed right groin incision and popliteal wound. Cannot palpate a popliteal pulse. She does have audible signal at her posterior tibial and peroneal artery. I do not hear an anterior tibial signal.  Ankle arm index 0.33 on the right and 0.8 on the left. Graft scanning reveals patent graft with normal velocities. Difficult to image her proximal anastomosis to 2 scar.  Past Medical History  Diagnosis Date  . Hypertension     Unspecified  . Coronary artery disease     a. s/p NSTEMI 02/2009 - PCI LCX with Xience DES. Otherwise branch vessel and Dist. RCA dzs. NL EF.   Marland Kitchen Hyperlipidemia     Mixed  . Diabetes mellitus   . Tobacco abuse     Remote  . Peripheral vascular disease     unspecified, a. s/p L CEA b. s/p B fem-pop bypass  . Loose, teeth     has loose bridge and two loose teeth holding it    History  Substance Use Topics  . Smoking status: Former Smoker    Types: Cigarettes    Quit date: 07/05/2001  . Smokeless tobacco: Not on file  . Alcohol Use: 7.0 oz/week    14 drink(s) per week    Family History  Problem Relation Age of Onset  . Heart attack Mother   . Heart attack Father   . Hypertension Brother   . Diabetes Brother   . Hypertension Brother     Allergies  Allergen Reactions  . Acetaminophen Other (See Comments)    Does not tolerate well  . Codeine Nausea And Vomiting  . Erythromycin Nausea And Vomiting  . Hydromorphone Nausea And Vomiting  . Ibuprofen Other (See Comments)    Does not tolerate well  . Propoxyphene Hcl Nausea And Vomiting  . Statins Other (See Comments)    Leg cramps    Current  outpatient prescriptions:ALPRAZolam (XANAX) 0.25 MG tablet, Take 0.25 mg by mouth daily. , Disp: , Rfl: ;  aspirin 81 MG tablet, Take 81 mg by mouth daily. , Disp: , Rfl: ;  calcium carbonate (TUMS) 500 MG chewable tablet, Chew 1-4 tablets by mouth as needed. For acid reflux, Disp: , Rfl: ;  clopidogrel (PLAVIX) 75 MG tablet, Take 75 mg by mouth daily., Disp: , Rfl:  dexlansoprazole (DEXILANT) 60 MG capsule, Take 60 mg by mouth 2 (two) times daily., Disp: , Rfl: ;  glyBURIDE (DIABETA) 2.5 MG tablet, Take 2.5 mg by mouth daily. , Disp: , Rfl: ;  hydrochlorothiazide 25 MG tablet, Take 25 mg by mouth daily. , Disp: , Rfl: ;  iron polysaccharides (NIFEREX) 150 MG capsule, Take 150 mg by mouth 2 (two) times daily. , Disp: , Rfl:  isosorbide mononitrate (IMDUR) 30 MG 24 hr tablet, Take 15 mg by mouth 2 (two) times daily. Take 1/2 tablet by mouth every morning and every evening, Disp: , Rfl: ;  metoprolol succinate (TOPROL-XL) 25 MG 24 hr tablet, Take 25 mg by mouth daily. , Disp: , Rfl: ;  nitroGLYCERIN (NITROSTAT) 0.4 MG SL tablet, Place 0.4 mg under the tongue every 5 (five) minutes as needed. For chest pain, Disp: , Rfl:  omega-3 acid ethyl esters (LOVAZA) 1 G capsule, Take 2 g by mouth 2 (two) times daily. , Disp: , Rfl: ;  pioglitazone-metformin (ACTOPLUS MET) 15-500 MG per tablet, Take 1 tablet by mouth daily. Brand name only-does not work if components are given separately per patient, Disp: , Rfl: ;  ramipril (ALTACE) 2.5 MG capsule, Take 1.25 mg by mouth daily. , Disp: , Rfl: ;  sitaGLIPtan (JANUVIA) 100 MG tablet, Take 100 mg by mouth daily. , Disp: , Rfl:  Vitamin D, Ergocalciferol, (DRISDOL) 50000 UNITS CAPS, Take 50,000 Units by mouth every Monday, Wednesday, and Friday. , Disp: , Rfl:   BP 172/53  Pulse 77  Resp 16  Ht 5\' 4"  (1.626 m)  Wt 161 lb 9.6 oz (73.301 kg)  BMI 27.74 kg/m2  Body mass index is 27.74 kg/(m^2).       Impression and plan patent right femoropopliteal bypass at  persistent poor flow. Her recent thrombectomy included endarterectomy of her external iliac and common femoral artery with agent from this onto her prior femoropopliteal bypass. She had an above-knee femoral-popliteal bypass and a vein bypass from the above-knee graft to the below-knee popliteal artery. This has been after occlusion of her popliteal artery with a patent possible graft. I have recommended formal arteriography for further evaluation the 2 the ongoing ischemia neck incision that she may have proximal cause for her poor flow. She is extremely difficult IV access and difficulty with blood draws. The fascia Z. have either a PICC line in her central line simply for IV access. We will be prepared for a PICC line if needed at the time of her arteriography. This is been scheduled for March 20 and she understands that we'll be Dr. Myra Gianotti or myself performing the procedure. They also understand that if percutaneous treatment is indicated will be accomplished at this same setting

## 2011-09-14 ENCOUNTER — Other Ambulatory Visit: Payer: Self-pay

## 2011-09-20 ENCOUNTER — Encounter (HOSPITAL_COMMUNITY): Payer: Self-pay | Admitting: Pharmacy Technician

## 2011-09-21 ENCOUNTER — Encounter (HOSPITAL_COMMUNITY)
Admission: RE | Disposition: A | Discharge status: Home / Self Care | Payer: Self-pay | Source: Ambulatory Visit | Attending: Surgery

## 2011-09-21 ENCOUNTER — Ambulatory Visit (HOSPITAL_COMMUNITY)
Admission: RE | Admit: 2011-09-21 | Discharge: 2011-09-21 | Disposition: A | Discharge status: Home / Self Care | Payer: Medicare Other | Source: Ambulatory Visit | Attending: Surgery | Admitting: Surgery

## 2011-09-21 ENCOUNTER — Other Ambulatory Visit: Payer: Self-pay | Admitting: *Deleted

## 2011-09-21 DIAGNOSIS — I739 Peripheral vascular disease, unspecified: Secondary | ICD-10-CM

## 2011-09-21 DIAGNOSIS — I10 Essential (primary) hypertension: Secondary | ICD-10-CM | POA: Insufficient documentation

## 2011-09-21 DIAGNOSIS — Y831 Surgical operation with implant of artificial internal device as the cause of abnormal reaction of the patient, or of later complication, without mention of misadventure at the time of the procedure: Secondary | ICD-10-CM | POA: Insufficient documentation

## 2011-09-21 DIAGNOSIS — E785 Hyperlipidemia, unspecified: Secondary | ICD-10-CM | POA: Insufficient documentation

## 2011-09-21 DIAGNOSIS — E119 Type 2 diabetes mellitus without complications: Secondary | ICD-10-CM | POA: Insufficient documentation

## 2011-09-21 DIAGNOSIS — F17200 Nicotine dependence, unspecified, uncomplicated: Secondary | ICD-10-CM | POA: Insufficient documentation

## 2011-09-21 DIAGNOSIS — L98499 Non-pressure chronic ulcer of skin of other sites with unspecified severity: Secondary | ICD-10-CM

## 2011-09-21 DIAGNOSIS — L97509 Non-pressure chronic ulcer of other part of unspecified foot with unspecified severity: Secondary | ICD-10-CM | POA: Insufficient documentation

## 2011-09-21 DIAGNOSIS — I2510 Atherosclerotic heart disease of native coronary artery without angina pectoris: Secondary | ICD-10-CM | POA: Insufficient documentation

## 2011-09-21 DIAGNOSIS — T82898A Other specified complication of vascular prosthetic devices, implants and grafts, initial encounter: Secondary | ICD-10-CM | POA: Insufficient documentation

## 2011-09-21 HISTORY — PX: ABDOMINAL ANGIOGRAM: SHX5499

## 2011-09-21 HISTORY — PX: LOWER EXTREMITY ANGIOGRAM: SHX5508

## 2011-09-21 LAB — POCT I-STAT, CHEM 8
Calcium, Ion: 1.1 mmol/L — ABNORMAL LOW (ref 1.12–1.32)
Chloride: 103 mEq/L (ref 96–112)
Glucose, Bld: 199 mg/dL — ABNORMAL HIGH (ref 70–99)
HCT: 31 % — ABNORMAL LOW (ref 36.0–46.0)
Hemoglobin: 10.5 g/dL — ABNORMAL LOW (ref 12.0–15.0)
TCO2: 25 mmol/L (ref 0–100)

## 2011-09-21 LAB — POCT ACTIVATED CLOTTING TIME
Activated Clotting Time: 232 seconds
Activated Clotting Time: 210 seconds
Activated Clotting Time: 177 seconds

## 2011-09-21 LAB — GLUCOSE, CAPILLARY
Glucose-Capillary: 133 mg/dL — ABNORMAL HIGH (ref 70–99)
Glucose-Capillary: 111 mg/dL — ABNORMAL HIGH (ref 70–99)

## 2011-09-21 SURGERY — ANGIOGRAM, LOWER EXTREMITY
Anesthesia: LOCAL

## 2011-09-21 MED ORDER — HEPARIN SODIUM (PORCINE) 1000 UNIT/ML IJ SOLN
INTRAMUSCULAR | Status: AC
  Filled 2011-09-21: qty 1

## 2011-09-21 MED ORDER — ONDANSETRON HCL 4 MG/2ML IJ SOLN: 4.0000 mg | Freq: Four times a day (QID) | INTRAMUSCULAR | Status: DC | PRN

## 2011-09-21 MED ORDER — MORPHINE SULFATE 4 MG/ML IJ SOLN: 2.0000 mg | INTRAMUSCULAR | Status: DC | PRN

## 2011-09-21 MED ORDER — ACETAMINOPHEN 325 MG PO TABS: 650.0000 mg | ORAL_TABLET | ORAL | Status: DC | PRN

## 2011-09-21 MED ORDER — HEPARIN (PORCINE) IN NACL 2-0.9 UNIT/ML-% IJ SOLN
INTRAMUSCULAR | Status: AC
  Filled 2011-09-21: qty 1000

## 2011-09-21 MED ORDER — FENTANYL CITRATE 0.05 MG/ML IJ SOLN
INTRAMUSCULAR | Status: AC
  Filled 2011-09-21: qty 2

## 2011-09-21 MED ORDER — LIDOCAINE HCL (PF) 1 % IJ SOLN
INTRAMUSCULAR | Status: AC
  Filled 2011-09-21: qty 30

## 2011-09-21 MED ORDER — SODIUM CHLORIDE 0.9 % IV SOLN: 1.0000 mL/kg/h | INTRAVENOUS | Status: DC

## 2011-09-21 MED ORDER — SODIUM CHLORIDE 0.9 % IV SOLN
INTRAVENOUS | Status: DC
  Administered 2011-09-21: 07:00:00 via INTRAVENOUS

## 2011-09-21 MED ORDER — MIDAZOLAM HCL 2 MG/2ML IJ SOLN
INTRAMUSCULAR | Status: AC
  Filled 2011-09-21: qty 2

## 2011-09-21 NOTE — Progress Notes (Signed)
[place], [place] returned call and instructed to hold pressure to left groin x 20 minutes

## 2011-09-21 NOTE — Progress Notes (Signed)
Pressure held to left groin x 20 minutes, no further oozing noted, will monitor.

## 2011-09-21 NOTE — Progress Notes (Signed)
D/C instructions given, pt verbalized understanding 

## 2011-09-21 NOTE — Progress Notes (Signed)
Pt had additional oozing on left groin dressing. [place], [place] paged.

## 2011-09-21 NOTE — Progress Notes (Signed)
Pt up and walked in hallway, left groin stable, no further oozing.

## 2011-09-21 NOTE — Op Note (Signed)
[facility] [place]  Patient name: [name] MRN: [id number] DOB: June 21, 1946 Sex: female  09/21/2011 Pre-operative Diagnosis: Right foot ulcer Post-operative diagnosis:  Same Surgeon:  Jorge Ny Procedure Performed:  1.  ultrasound access left femoral artery  2.  abdominal aortogram  3.  right leg runoff  4.  second order catheterization  5.  angioplasty right external iliac artery  6.  angioplasty right femoral artery     Indications:  The patient has had multiple percutaneous and surgical interventions to restore blood flow to her right leg. She recently underwent thrombectomy of a occluded right femoral-popliteal bypass graft. Postoperatively her ABI is still diminished and she has a foot wound that is not improving. She comes in today for angiogram and possible intervention.  Procedure:  The patient was identified in the holding area and taken to room 8.  The patient was then placed supine on the table and prepped and draped in the usual sterile fashion.  A time out was called.  Ultrasound was used to evaluate the left common femoral artery.  It was patent .  A digital ultrasound image was acquired.  A micropuncture needle was used to access the left common femoral artery under ultrasound guidance.  An 018 wire was advanced without resistance and a micropuncture sheath was placed.  The 018 wire was removed and a benson wire was placed.  The micropuncture sheath was exchanged for a 5 french sheath.  An omniflush catheter was advanced over the wire to the level of L-1.  An abdominal angiogram was obtained.  Next, using the omniflush catheter and a benson wire, the aortic bifurcation was crossed and the catheter was placed into theright external iliac artery and right runoff was obtained.   Findings:   Aortogram:  The visualized portions of the suprarenal aorta showed no significant disease. There is no evidence of renal artery stenosis. The infrarenal  aorta is widely patent. The right common iliac artery is widely patent. The right hypogastric artery is patent there is a stent within the right external iliac artery with a high-grade in-stent stenosis. There is also disease extending down into the proximal common femoral artery. Left common external and internal iliac arteries are patent throughout their course.  Right Lower Extremity:  There is a stenosis within the proximal right common femoral artery. The femoral-popliteal bypass graft is widely patent. The single-vessel runoff is via the peroneal artery. There is significant disease up onto the foot.  Left Lower Extremity:  Severe stenosis and calcification within the left common femoral artery on the order of 60%. Formal evaluation of the left leg was not performed today do to the volume of contrast used for the intervention.  Intervention:  After the above images were obtained the decision was made to proceed with intervention. Over a bursa core wire a 6 [place] Ansel 1 sheath was placed. This was advanced into the right external iliac artery. The patient was fully heparinized. I proceeded with balloon angioplasty of the in-stent stenosis using a 6 x 40 balloon. This was taken to the radial pressure which was 16 atmospheres and held for 90 seconds. I then performed primary balloon angioplasty within the native proximal common femoral artery. I was concerned about the possibility of dissection and therefore just took this balloon to profile and held for 90 seconds. Completion angiogram revealed significantly improved results with him the external iliac stent as well as improve flow across the area of common femoral disease.  At this point the decision was made to terminate the procedure. Catheters and wires were removed. The patient taken the holding area for sheath pull.  Impression:  #1  successful balloon angioplasty of right external iliac and in stent stenosis with a 6 mm balloon  #2  successful  balloon angioplasty of proximal right common femoral artery stenosis with a 6 mm balloon  #3  left common femoral artery plaque and narrowing of approximately 60%  #4  formal left leg runoff was not performed due to to contrast restriction     V. [name], M.D. [facility] [place] Office: [phone] Pager:  [phone]

## 2011-09-21 NOTE — Discharge Instructions (Signed)

## 2011-09-21 NOTE — H&P (View-Only) (Signed)
Patient has today for followup of thrombectomy revision of her right femoral to popliteal bypass on 08/22/2011. He reports no rest pain in her right foot. The pain with acute occlusion of her femoropopliteal has resolved. She did have an ulceration on the medial aspect of her right great toe and this is somewhat worse following surgery.  Physical exam: Well-healed right groin incision and popliteal wound. Cannot palpate a popliteal pulse. She does have audible signal at her posterior tibial and peroneal artery. I do not hear an anterior tibial signal.  Ankle arm index 0.33 on the right and 0.8 on the left. Graft scanning reveals patent graft with normal velocities. Difficult to image her proximal anastomosis to 2 scar.  Past Medical History  Diagnosis Date  . Hypertension     Unspecified  . Coronary artery disease     a. s/p NSTEMI 02/2009 - PCI LCX with Xience DES. Otherwise branch vessel and Dist. RCA dzs. NL EF.   . Hyperlipidemia     Mixed  . Diabetes mellitus   . Tobacco abuse     Remote  . Peripheral vascular disease     unspecified, a. s/p L CEA b. s/p B fem-pop bypass  . Loose, teeth     has loose bridge and two loose teeth holding it    History  Substance Use Topics  . Smoking status: Former Smoker    Types: Cigarettes    Quit date: 07/05/2001  . Smokeless tobacco: Not on file  . Alcohol Use: 7.0 oz/week    14 drink(s) per week    Family History  Problem Relation Age of Onset  . Heart attack Mother   . Heart attack Father   . Hypertension Brother   . Diabetes Brother   . Hypertension Brother     Allergies  Allergen Reactions  . Acetaminophen Other (See Comments)    Does not tolerate well  . Codeine Nausea And Vomiting  . Erythromycin Nausea And Vomiting  . Hydromorphone Nausea And Vomiting  . Ibuprofen Other (See Comments)    Does not tolerate well  . Propoxyphene Hcl Nausea And Vomiting  . Statins Other (See Comments)    Leg cramps    Current  outpatient prescriptions:ALPRAZolam (XANAX) 0.25 MG tablet, Take 0.25 mg by mouth daily. , Disp: , Rfl: ;  aspirin 81 MG tablet, Take 81 mg by mouth daily. , Disp: , Rfl: ;  calcium carbonate (TUMS) 500 MG chewable tablet, Chew 1-4 tablets by mouth as needed. For acid reflux, Disp: , Rfl: ;  clopidogrel (PLAVIX) 75 MG tablet, Take 75 mg by mouth daily., Disp: , Rfl:  dexlansoprazole (DEXILANT) 60 MG capsule, Take 60 mg by mouth 2 (two) times daily., Disp: , Rfl: ;  glyBURIDE (DIABETA) 2.5 MG tablet, Take 2.5 mg by mouth daily. , Disp: , Rfl: ;  hydrochlorothiazide 25 MG tablet, Take 25 mg by mouth daily. , Disp: , Rfl: ;  iron polysaccharides (NIFEREX) 150 MG capsule, Take 150 mg by mouth 2 (two) times daily. , Disp: , Rfl:  isosorbide mononitrate (IMDUR) 30 MG 24 hr tablet, Take 15 mg by mouth 2 (two) times daily. Take 1/2 tablet by mouth every morning and every evening, Disp: , Rfl: ;  metoprolol succinate (TOPROL-XL) 25 MG 24 hr tablet, Take 25 mg by mouth daily. , Disp: , Rfl: ;  nitroGLYCERIN (NITROSTAT) 0.4 MG SL tablet, Place 0.4 mg under the tongue every 5 (five) minutes as needed. For chest pain, Disp: , Rfl:    omega-3 acid ethyl esters (LOVAZA) 1 G capsule, Take 2 g by mouth 2 (two) times daily. , Disp: , Rfl: ;  pioglitazone-metformin (ACTOPLUS MET) 15-500 MG per tablet, Take 1 tablet by mouth daily. Brand name only-does not work if components are given separately per patient, Disp: , Rfl: ;  ramipril (ALTACE) 2.5 MG capsule, Take 1.25 mg by mouth daily. , Disp: , Rfl: ;  sitaGLIPtan (JANUVIA) 100 MG tablet, Take 100 mg by mouth daily. , Disp: , Rfl:  Vitamin D, Ergocalciferol, (DRISDOL) 50000 UNITS CAPS, Take 50,000 Units by mouth every Monday, Wednesday, and Friday. , Disp: , Rfl:   BP 172/53  Pulse 77  Resp 16  Ht 5' 4" (1.626 m)  Wt 161 lb 9.6 oz (73.301 kg)  BMI 27.74 kg/m2  Body mass index is 27.74 kg/(m^2).       Impression and plan patent right femoropopliteal bypass at  persistent poor flow. Her recent thrombectomy included endarterectomy of her external iliac and common femoral artery with agent from this onto her prior femoropopliteal bypass. She had an above-knee femoral-popliteal bypass and a vein bypass from the above-knee graft to the below-knee popliteal artery. This has been after occlusion of her popliteal artery with a patent possible graft. I have recommended formal arteriography for further evaluation the 2 the ongoing ischemia neck incision that she may have proximal cause for her poor flow. She is extremely difficult IV access and difficulty with blood draws. The fascia Z. have either a PICC line in her central line simply for IV access. We will be prepared for a PICC line if needed at the time of her arteriography. This is been scheduled for March 20 and she understands that we'll be Dr. Brabham or myself performing the procedure. They also understand that if percutaneous treatment is indicated will be accomplished at this same setting 

## 2011-09-21 NOTE — Progress Notes (Signed)
Oozing from left groin site noted. Dressing changed. Will monitor

## 2011-09-21 NOTE — Interval H&P Note (Signed)
History and Physical Interval Note:  09/21/2011 8:15 AM  [name]  has presented today for surgery, with the diagnosis of r/o PVD  The various methods of treatment have been discussed with the patient and family. After consideration of risks, benefits and other options for treatment, the patient has consented to  Procedure(s) (LRB): LOWER EXTREMITY ANGIOGRAM (N/A) as a surgical intervention .  The patients' history has been reviewed, patient examined, no change in status, stable for surgery.  I have reviewed the patients' chart and labs.  Questions were answered to the patient's satisfaction.     Kristofer Schaffert IV, V. WELLS

## 2011-09-22 NOTE — Procedures (Unsigned)
BYPASS GRAFT EVALUATION  INDICATION:  Followup most recent intervention  HISTORY: Diabetes:  yes Cardiac:  no Hypertension:  yes Smoking:  previous Previous Surgery:  Bilateral CIA, right EIA, and right popliteal stents, 03/30/2010. Left femoral to popliteal bypass graft 07/09/2007, left femoral endarterectomy 07/13/2010. Right femoral to popliteal graft revision 10/30/2007, with below knee jump graft 09/16/2010. Thrombectomy and revision of right femoral popliteal bypass graft 08/22/2011.  SINGLE LEVEL ARTERIAL EXAM                              RIGHT              LEFT Brachial:                    139                106 Anterior tibial:             Inaudible          89 Posterior tibial:            86                 111 Peroneal: Ankle/brachial index:        0.33               0.80  PREVIOUS ABI:  Date: 08/09/2011  RIGHT:  0.33  LEFT:  0.80  LOWER EXTREMITY BYPASS GRAFT DUPLEX EXAM:  DUPLEX:  Widely patent femoral to popliteal bypass graft. Wave forms are monophasic and dampened within the graft. Turbulent flow noted in the right groin with a velocity of 176 cm/sec noted in the distal external iliac artery. Visualization in the right groin is somewhat limited due to overlying scar tissue from most recent surgery.  IMPRESSION:  Widely patent right lower extremity graft with probable inflow disease present. Evidence of critical arterial insufficiency in the right lower extremity. Evidence of moderate arterial insufficiency in the left lower extremity at rest which is stable.  ___________________________________________ Larina Earthly, M.D.  EM/MEDQ  D:  09/14/2011  T:  09/14/2011  Job:  [id number]

## 2011-10-10 ENCOUNTER — Other Ambulatory Visit: Payer: Self-pay | Admitting: *Deleted

## 2011-10-10 ENCOUNTER — Encounter: Payer: Self-pay | Admitting: Vascular Surgery

## 2011-10-10 DIAGNOSIS — Z48812 Encounter for surgical aftercare following surgery on the circulatory system: Secondary | ICD-10-CM

## 2011-10-10 DIAGNOSIS — I739 Peripheral vascular disease, unspecified: Secondary | ICD-10-CM

## 2011-10-11 ENCOUNTER — Encounter (INDEPENDENT_AMBULATORY_CARE_PROVIDER_SITE_OTHER): Payer: Medicare Other | Admitting: *Deleted

## 2011-10-11 ENCOUNTER — Ambulatory Visit: Payer: Medicare Other | Admitting: Vascular Surgery

## 2011-10-11 ENCOUNTER — Ambulatory Visit (INDEPENDENT_AMBULATORY_CARE_PROVIDER_SITE_OTHER): Payer: Medicare Other | Admitting: Vascular Surgery

## 2011-10-11 ENCOUNTER — Encounter: Payer: Self-pay | Admitting: Vascular Surgery

## 2011-10-11 VITALS — BP 94/61 | HR 83 | Resp 16 | Ht 64.0 in | Wt 161.0 lb

## 2011-10-11 DIAGNOSIS — I739 Peripheral vascular disease, unspecified: Secondary | ICD-10-CM

## 2011-10-11 DIAGNOSIS — L98499 Non-pressure chronic ulcer of skin of other sites with unspecified severity: Secondary | ICD-10-CM

## 2011-10-11 DIAGNOSIS — Z48812 Encounter for surgical aftercare following surgery on the circulatory system: Secondary | ICD-10-CM

## 2011-10-11 NOTE — Progress Notes (Signed)
The patient presents today for followup of her recent re\re angioplasty of right external iliac stenosis by Dr. Myra Gianotti on 09/21/2011. She's had a good result with markedly improved walking ability. She was able to go on a cruise with her family and grandchildren atgood time  Past Medical History  Diagnosis Date  . Hypertension     Unspecified  . Coronary artery disease     a. s/p NSTEMI 02/2009 - PCI LCX with Xience DES. Otherwise branch vessel and Dist. RCA dzs. NL EF.   Marland Kitchen Hyperlipidemia     Mixed  . Diabetes mellitus   . Tobacco abuse     Remote  . Peripheral vascular disease     unspecified, a. s/p L CEA b. s/p B fem-pop bypass  . Loose, teeth     has loose bridge and two loose teeth holding it    History  Substance Use Topics  . Smoking status: Former Smoker    Types: Cigarettes    Quit date: 07/05/2001  . Smokeless tobacco: Not on file  . Alcohol Use: 7.0 oz/week    14 drink(s) per week    Family History  Problem Relation Age of Onset  . Heart attack Mother   . Heart attack Father   . Hypertension Brother   . Diabetes Brother   . Hypertension Brother     Allergies  Allergen Reactions  . Acetaminophen Other (See Comments)    Does not tolerate well  . Codeine Nausea And Vomiting  . Erythromycin Nausea And Vomiting  . Hydrocodone-Acetaminophen Nausea And Vomiting  . Hydromorphone Nausea And Vomiting  . Ibuprofen Other (See Comments)    Does not tolerate well  . Propoxyphene Hcl Nausea And Vomiting  . Statins Other (See Comments)    Leg cramps    Current outpatient prescriptions:ALPRAZolam (XANAX) 0.25 MG tablet, Take 0.25 mg by mouth daily. , Disp: , Rfl: ;  aspirin 81 MG tablet, Take 81 mg by mouth daily. , Disp: , Rfl: ;  calcium carbonate (TUMS) 500 MG chewable tablet, Chew 1-4 tablets by mouth daily as needed. For acid reflux. Pt can take 1 to 4 tabs as needed for reflux, Disp: , Rfl:  ciprofloxacin (CIPRO) 250 MG tablet, Take 250 mg by mouth 2 (two) times  daily. For 7 days started on 09-19-11, Disp: , Rfl: ;  clopidogrel (PLAVIX) 75 MG tablet, Take 75 mg by mouth daily., Disp: , Rfl: ;  dexlansoprazole (DEXILANT) 60 MG capsule, Take 60 mg by mouth 2 (two) times daily., Disp: , Rfl: ;  glyBURIDE (DIABETA) 2.5 MG tablet, Take 2.5 mg by mouth daily. , Disp: , Rfl:  hydrochlorothiazide 25 MG tablet, Take 25 mg by mouth daily. , Disp: , Rfl: ;  iron polysaccharides (NIFEREX) 150 MG capsule, Take 150 mg by mouth 2 (two) times daily. , Disp: , Rfl: ;  isosorbide mononitrate (IMDUR) 30 MG 24 hr tablet, Take 15 mg by mouth 2 (two) times daily. , Disp: , Rfl: ;  metoprolol succinate (TOPROL-XL) 25 MG 24 hr tablet, Take 25 mg by mouth daily. , Disp: , Rfl:  nitroGLYCERIN (NITROSTAT) 0.4 MG SL tablet, Place 0.4 mg under the tongue every 5 (five) minutes as needed. For chest pain, Disp: , Rfl: ;  omega-3 acid ethyl esters (LOVAZA) 1 G capsule, Take 2 g by mouth 2 (two) times daily. , Disp: , Rfl: ;  pioglitazone-metformin (ACTOPLUS MET) 15-500 MG per tablet, Take 1 tablet by mouth daily. Brand name only-does not work  if components are given separately per patient, Disp: , Rfl:  prochlorperazine (COMPAZINE) 10 MG tablet, Take 10 mg by mouth every 8 (eight) hours as needed. For nausea, Disp: , Rfl: ;  ramipril (ALTACE) 1.25 MG capsule, Take 1.25 mg by mouth daily., Disp: , Rfl: ;  sitaGLIPtan (JANUVIA) 100 MG tablet, Take 100 mg by mouth daily. , Disp: , Rfl: ;  Vitamin D, Ergocalciferol, (DRISDOL) 50000 UNITS CAPS, Take 50,000 Units by mouth every Monday, Wednesday, and Friday. , Disp: , Rfl:   BP 94/61  Pulse 83  Resp 16  Ht 5\' 4"  (1.626 m)  Wt 161 lb (73.029 kg)  BMI 27.64 kg/m2  SpO2 99%  Body mass index is 27.64 kg/(m^2).       Physical exam: 2+ femoral pulses bilaterally. She does have palpable popliteal pulses bilaterally. I do not palpate pedal pulses. She does have good healing of the ulcer on the medial aspect of her right great toe.  Vascular lab  study today ankle arm index is improved from 0.33 to 0.65 on the right  Impression and plan: Stable lower extremity arterial situation with diffuse peripheral vascular occlusive disease. She will continue her walking program we will see her again in 3 months for continued vascular lab followup

## 2011-10-11 NOTE — Progress Notes (Signed)
Addended by: Sharee Pimple on: 10/11/2011 01:51 PM   Modules accepted: Orders

## 2011-11-11 ENCOUNTER — Ambulatory Visit (INDEPENDENT_AMBULATORY_CARE_PROVIDER_SITE_OTHER): Payer: Medicare Other | Admitting: Vascular Surgery

## 2011-11-11 ENCOUNTER — Telehealth: Payer: Self-pay

## 2011-11-11 ENCOUNTER — Encounter: Payer: Self-pay | Admitting: Vascular Surgery

## 2011-11-11 VITALS — BP 133/65 | HR 79 | Temp 98.6°F | Ht 64.0 in | Wt 162.0 lb

## 2011-11-11 DIAGNOSIS — I7025 Atherosclerosis of native arteries of other extremities with ulceration: Secondary | ICD-10-CM | POA: Insufficient documentation

## 2011-11-11 DIAGNOSIS — L0390 Cellulitis, unspecified: Secondary | ICD-10-CM

## 2011-11-11 DIAGNOSIS — I739 Peripheral vascular disease, unspecified: Secondary | ICD-10-CM

## 2011-11-11 DIAGNOSIS — L98499 Non-pressure chronic ulcer of skin of other sites with unspecified severity: Secondary | ICD-10-CM | POA: Insufficient documentation

## 2011-11-11 MED ORDER — SULFAMETHOXAZOLE-TMP DS 800-160 MG PO TABS: 1.0000 | ORAL_TABLET | Freq: Two times a day (BID) | ORAL | Status: DC

## 2011-11-11 NOTE — Progress Notes (Signed)
[facility] [place]  Postoperative Visit  History of Present Illness  [name] is a 66 y.o. year old female who presents for postoperative follow-up for: Thrombectomy of right femoral to popliteal bypass, right external iliac and femoral endarterectomy, revision of proximal anastomosis By Dr. Arbie Cookey (Date: 08/22/11).  The patient's notes her right popliteal wound has opened up further and is painful.  The patient denies fever or chills.  She notes serosang. drainage from a new "hole" in her popliteal exposure incision.  Her previous sx are improved otherwise  Physical Examination  Filed Vitals:   11/11/11 1551  BP: 133/65  Pulse: 79  Temp: 98.6 F (37 C)   RLE: popliteal incision with superficial breakdown, 3 punctate holes: most superior is draining serosang. Fluid, +TTP light touch with erythema, R foot warm and well perfused  Medical Decision Making  Chistina [name] is a 66 y.o. year old female who presents s/p Thrombectomy of right femoral to popliteal bypass, right external iliac and femoral endarterectomy, revision of proximal anastomosis  The patient's exam is c/w w/ spont. decompressing seroma/hematoma and cellulitis I prescribed Bactrim DS 1 po BID x 10 days I gave her local wound care instructions: wash the wound twice a day with soap and water and applied new bandages twice a day.  The patient was scheduled to follow up w/ Dr. Arbie Cookey in the next two weeks.  Leonides Sake, MD [facility] [place] Office: [phone] Pager: [phone]

## 2011-11-11 NOTE — Telephone Encounter (Signed)
Pt. Called with c/o increased inflammation, and episode of bleeding from right lower extremity medial incision.  States it never has completely healed-up.  Denies fever/ chills.  C/o of this area being sore and tender.  Requests to have incision checked today.  States at [facility] and cannot get to office prior to 2:00pm.  Appt. given for pt. to be seen at 4:00 PM.

## 2011-11-21 ENCOUNTER — Encounter: Payer: Self-pay | Admitting: Vascular Surgery

## 2011-11-22 ENCOUNTER — Ambulatory Visit (INDEPENDENT_AMBULATORY_CARE_PROVIDER_SITE_OTHER): Payer: Medicare Other | Admitting: Vascular Surgery

## 2011-11-22 ENCOUNTER — Encounter: Payer: Self-pay | Admitting: Vascular Surgery

## 2011-11-22 VITALS — BP 154/53 | HR 74 | Resp 18 | Ht 64.0 in | Wt 158.8 lb

## 2011-11-22 DIAGNOSIS — I739 Peripheral vascular disease, unspecified: Secondary | ICD-10-CM

## 2011-11-22 DIAGNOSIS — L98499 Non-pressure chronic ulcer of skin of other sites with unspecified severity: Secondary | ICD-10-CM

## 2011-11-22 NOTE — Progress Notes (Signed)
Patient here today for followup of the right medial popliteal incision. She had undergone redo right femoral-popliteal bypass on February of 2013 and successfully underwent a recurrent dilatation of the external iliac stenosis above this several months later. She is quite good today she reports that she is walking without claudication symptoms. Her foot is well-perfused and the toe ulcer is now completely healed. She does have a small punctate area in the medial aspect of her popliteal incision. This is does not track and is not erythematous. She has been on Bactrim for 2 weeks and will continue this for one additional week and then discontinue this. We will see her again in 3 months for a routine followup vascular lab

## 2012-01-09 ENCOUNTER — Other Ambulatory Visit: Payer: Self-pay | Admitting: *Deleted

## 2012-01-09 ENCOUNTER — Telehealth: Payer: Self-pay

## 2012-01-09 DIAGNOSIS — Z48812 Encounter for surgical aftercare following surgery on the circulatory system: Secondary | ICD-10-CM

## 2012-01-09 DIAGNOSIS — I70219 Atherosclerosis of native arteries of extremities with intermittent claudication, unspecified extremity: Secondary | ICD-10-CM

## 2012-01-09 NOTE — Telephone Encounter (Signed)
Pt. Called with c/o right foot turning red and stated the ball of right foot, and the right heel are very sore to walk on.  Also reports that the right foot feels cool.  States she also has some soreness on ball of left foot and noticed that area starting to get cool.  Stated that the right foot symptoms are worse than the left.  Denies rest pain.  Discussed w/ Dr. Myra Gianotti.  Rec'd v.o. For bilateral lower arterial duplex and ABI's, and add to nurse practitioner schedule on 01/10/12.  Pt. Will be scheduled and notified per the front desk schedulers.

## 2012-01-10 ENCOUNTER — Encounter (INDEPENDENT_AMBULATORY_CARE_PROVIDER_SITE_OTHER): Payer: Medicare Other | Admitting: *Deleted

## 2012-01-10 ENCOUNTER — Ambulatory Visit (INDEPENDENT_AMBULATORY_CARE_PROVIDER_SITE_OTHER): Payer: Medicare Other | Admitting: *Deleted

## 2012-01-10 ENCOUNTER — Encounter: Payer: Self-pay | Admitting: Neurosurgery

## 2012-01-10 ENCOUNTER — Ambulatory Visit (INDEPENDENT_AMBULATORY_CARE_PROVIDER_SITE_OTHER): Payer: Medicare Other | Admitting: Neurosurgery

## 2012-01-10 ENCOUNTER — Other Ambulatory Visit: Payer: Self-pay | Admitting: Cardiovascular Disease

## 2012-01-10 VITALS — BP 82/62 | HR 80 | Resp 14 | Ht 64.0 in | Wt 158.0 lb

## 2012-01-10 DIAGNOSIS — I739 Peripheral vascular disease, unspecified: Secondary | ICD-10-CM

## 2012-01-10 DIAGNOSIS — Z48812 Encounter for surgical aftercare following surgery on the circulatory system: Secondary | ICD-10-CM

## 2012-01-10 DIAGNOSIS — I70219 Atherosclerosis of native arteries of extremities with intermittent claudication, unspecified extremity: Secondary | ICD-10-CM

## 2012-01-10 NOTE — Progress Notes (Signed)
[facility] [place] [facility] Office Note  CC: [name] with complaints of right lower extremity/foot coolness Referring Physician: Early  History of Present Illness: 66-year-old female [name] of Dr. Early that is an extensive peripheral vascular history. The [name] called stating that her right foot is beginning to hurt and feels "cool" and over the past few days she's starting to have the same symptoms on the left side. The ABIs today have dropped on the right from her previous study in April. The [name] does not indicate any particular event that may have been a precursor to her current problems, she states the symptoms have slowly worsened over the past 4-5 weeks.  Past Medical History  Diagnosis Date  . Hypertension     Unspecified  . Coronary artery disease     a. s/p NSTEMI 02/2009 - PCI LCX with Xience DES. Otherwise branch vessel and Dist. RCA dzs. NL EF.   . Hyperlipidemia     Mixed  . Diabetes mellitus   . Tobacco abuse     Remote  . Peripheral vascular disease     unspecified, a. s/p L CEA b. s/p B fem-pop bypass  . Loose, teeth     has loose bridge and two loose teeth holding it    ROS: [x] Positive   [ ] Denies    General: [ ] Weight loss, [ ] Fever, [ ] chills Neurologic: [ ] Dizziness, [ ] Blackouts, [ ] Seizure [ ] Stroke, [ ] "Mini stroke", [ ] Slurred speech, [ ] Temporary blindness; [ ] weakness in arms or legs, [ ] Hoarseness Cardiac: [ ] Chest pain/pressure, [ ] Shortness of breath at rest [ ] Shortness of breath with exertion, [ ] Atrial fibrillation or irregular heartbeat Vascular: [ ] Pain in legs with walking, [ ] Pain in legs at rest, [ ] Pain in legs at night,  [ ] Non-healing ulcer, [ ] Blood clot in vein/DVT,   Pulmonary: [ ] Home oxygen, [ ] Productive cough, [ ] Coughing up blood, [ ] Asthma,  [ ] Wheezing Musculoskeletal:  [ ] Arthritis, [ ] Low back pain, [ ] Joint pain Hematologic: [ ] Easy Bruising, [ ] Anemia; [ ]  Hepatitis Gastrointestinal: [ ] Blood in stool, [ ] Gastroesophageal Reflux/heartburn, [ ] Trouble swallowing Urinary: [ ] chronic Kidney disease, [ ] on HD - [ ] MWF or [ ] TTHS, [ ] Burning with urination, [ ] Difficulty urinating Skin: [ ] Rashes, [ ] Wounds Psychological: [ ] Anxiety, [ ] Depression   Social History History  Substance Use Topics  . Smoking status: Former Smoker    Types: Cigarettes    Quit date: 07/05/2001  . Smokeless tobacco: Not on file  . Alcohol Use: 7.0 oz/week    14 drink(s) per week    Family History Family History  Problem Relation Age of Onset  . Heart attack Mother   . Heart attack Father   . Hypertension Brother   . Diabetes Brother   . Hypertension Brother     Allergies  Allergen Reactions  . Acetaminophen Other (See Comments)    Does not tolerate well  . Codeine Nausea And Vomiting  . Erythromycin Nausea And Vomiting  . Hydrocodone-Acetaminophen Nausea And Vomiting  . Hydromorphone Nausea And Vomiting  . Ibuprofen Other (See Comments)    Does not tolerate well  . Propoxyphene Hcl Nausea And Vomiting  . Statins Other (See Comments)      Leg cramps    Current Outpatient Prescriptions  Medication Sig Dispense Refill  . ALPRAZolam (XANAX) 0.25 MG tablet Take 0.25 mg by mouth daily.       . aspirin 81 MG tablet Take 81 mg by mouth daily.       . calcium carbonate (TUMS) 500 MG chewable tablet Chew 1-4 tablets by mouth daily as needed. For acid reflux. Pt can take 1 to 4 tabs as needed for reflux      . clopidogrel (PLAVIX) 75 MG tablet Take 75 mg by mouth daily.      . dexlansoprazole (DEXILANT) 60 MG capsule Take 60 mg by mouth 2 (two) times daily.      . glyBURIDE (DIABETA) 2.5 MG tablet Take 2.5 mg by mouth daily.       . hydrochlorothiazide 25 MG tablet Take 25 mg by mouth daily.       . isosorbide mononitrate (IMDUR) 30 MG 24 hr tablet Take 15 mg by mouth 2 (two) times daily.       . metoprolol succinate (TOPROL-XL) 25 MG 24 hr  tablet Take 25 mg by mouth daily.       . nitroGLYCERIN (NITROSTAT) 0.4 MG SL tablet Place 0.4 mg under the tongue every 5 (five) minutes as needed. For chest pain      . omega-3 acid ethyl esters (LOVAZA) 1 G capsule Take 2 g by mouth 2 (two) times daily.       . pioglitazone-metformin (ACTOPLUS MET) 15-500 MG per tablet Take 1 tablet by mouth daily. Brand name only-does not work if components are given separately per [name]      . ramipril (ALTACE) 1.25 MG capsule Take 1.25 mg by mouth daily.      . sitaGLIPtan (JANUVIA) 100 MG tablet Take 100 mg by mouth daily.       . amoxicillin (AMOXIL) 500 MG capsule       . cephALEXin (KEFLEX) 500 MG capsule       . ciprofloxacin (CIPRO) 250 MG tablet Take 250 mg by mouth 2 (two) times daily. For 7 days started on 09-19-11      . iron polysaccharides (NIFEREX) 150 MG capsule Take 150 mg by mouth 2 (two) times daily.       . prochlorperazine (COMPAZINE) 10 MG tablet Take 10 mg by mouth every 8 (eight) hours as needed. For nausea      . sulfamethoxazole-trimethoprim (BACTRIM DS) 800-160 MG per tablet Take 2 tablets by mouth 2 (two) times daily.      . Vitamin D, Ergocalciferol, (DRISDOL) 50000 UNITS CAPS Take 50,000 Units by mouth every Monday, Wednesday, and Friday.         Physical Examination  Filed Vitals:   01/10/12 1318  BP: 82/62  Pulse: 80  Resp: 14    Body mass index is 27.12 kg/(m^2).  General:  WDWN in NAD Gait: Normal HEENT: WNL Eyes: Pupils equal Pulmonary: normal non-labored breathing , without Rales, rhonchi,  wheezing Cardiac: RRR, without  Murmurs, rubs or gallops; No carotid bruits Abdomen: soft, NT, no masses Skin: no rashes, ulcers noted Vascular Exam/Pulses: 1+ femoral pulse on the right, 3+ femoral pulse on the left lower extremity pulses on the right are not palpable  Extremities without ischemic changes, no Gangrene , no cellulitis; no open wounds;  Musculoskeletal: no muscle wasting or atrophy  Neurologic: A&O X  3; Appropriate Affect ; SENSATION: normal; MOTOR FUNCTION:  moving all extremities equally. Speech is fluent/normal  Non-Invasive   Vascular Imaging: ABI today is 0.30 monophasic PT on the right, no wave on the DP. The [name] is 0.72 and biphasic on the left which is unchanged from April  ASSESSMENT/PLAN: I discussed current situation with Dr. Lawson who recommends the [name] undergo an aortic iliac study with runoff and possible intervention with Dr. Brabham or Dr. Early next week, the [name]'s husband has requested Dr. Brabham to do the study sooner than next week, Carol our triage nurse is working with them to get this scheduled soon as possible. The [name]'s followup after the study will be pending which physician  can do the procedure and when.  Juanmanuel Marohl ANP  Clinic M.D.: [facility]  

## 2012-01-12 ENCOUNTER — Encounter (HOSPITAL_COMMUNITY): Payer: Self-pay | Admitting: Pharmacy Technician

## 2012-01-13 ENCOUNTER — Other Ambulatory Visit: Payer: Self-pay

## 2012-01-17 ENCOUNTER — Ambulatory Visit (HOSPITAL_COMMUNITY)
Admission: RE | Admit: 2012-01-17 | Discharge: 2012-01-18 | Disposition: A | Discharge status: Home / Self Care | Payer: Medicare Other | Source: Ambulatory Visit | Attending: Surgery | Admitting: Surgery

## 2012-01-17 ENCOUNTER — Other Ambulatory Visit: Payer: Medicare Other

## 2012-01-17 ENCOUNTER — Other Ambulatory Visit: Payer: Self-pay | Admitting: *Deleted

## 2012-01-17 ENCOUNTER — Ambulatory Visit: Payer: Medicare Other | Admitting: Neurosurgery

## 2012-01-17 ENCOUNTER — Encounter (HOSPITAL_COMMUNITY): Payer: Self-pay | Admitting: General Practice

## 2012-01-17 ENCOUNTER — Telehealth: Payer: Self-pay | Admitting: Vascular Surgery

## 2012-01-17 ENCOUNTER — Encounter (HOSPITAL_COMMUNITY)
Admission: RE | Disposition: A | Discharge status: Home / Self Care | Payer: Self-pay | Source: Ambulatory Visit | Attending: Surgery

## 2012-01-17 DIAGNOSIS — I70219 Atherosclerosis of native arteries of extremities with intermittent claudication, unspecified extremity: Secondary | ICD-10-CM

## 2012-01-17 DIAGNOSIS — I10 Essential (primary) hypertension: Secondary | ICD-10-CM | POA: Insufficient documentation

## 2012-01-17 DIAGNOSIS — Z48812 Encounter for surgical aftercare following surgery on the circulatory system: Secondary | ICD-10-CM

## 2012-01-17 DIAGNOSIS — E785 Hyperlipidemia, unspecified: Secondary | ICD-10-CM | POA: Insufficient documentation

## 2012-01-17 DIAGNOSIS — K219 Gastro-esophageal reflux disease without esophagitis: Secondary | ICD-10-CM | POA: Insufficient documentation

## 2012-01-17 DIAGNOSIS — I70229 Atherosclerosis of native arteries of extremities with rest pain, unspecified extremity: Secondary | ICD-10-CM | POA: Insufficient documentation

## 2012-01-17 DIAGNOSIS — E119 Type 2 diabetes mellitus without complications: Secondary | ICD-10-CM | POA: Insufficient documentation

## 2012-01-17 DIAGNOSIS — I2510 Atherosclerotic heart disease of native coronary artery without angina pectoris: Secondary | ICD-10-CM | POA: Insufficient documentation

## 2012-01-17 HISTORY — DX: Type 2 diabetes mellitus without complications: E11.9

## 2012-01-17 HISTORY — PX: ABDOMINAL AORTAGRAM: SHX5454

## 2012-01-17 HISTORY — DX: Other specified postprocedural states: R11.2

## 2012-01-17 HISTORY — DX: Anxiety disorder, unspecified: F41.9

## 2012-01-17 HISTORY — PX: OTHER SURGICAL HISTORY: SHX169

## 2012-01-17 HISTORY — DX: Other specified postprocedural states: Z98.890

## 2012-01-17 HISTORY — DX: Gastro-esophageal reflux disease without esophagitis: K21.9

## 2012-01-17 LAB — GLUCOSE, CAPILLARY: Glucose-Capillary: 189 mg/dL — ABNORMAL HIGH (ref 70–99)

## 2012-01-17 LAB — POCT I-STAT, CHEM 8
BUN: 14 mg/dL (ref 6–23)
Calcium, Ion: 1.09 mmol/L — ABNORMAL LOW (ref 1.13–1.30)
Chloride: 103 mEq/L (ref 96–112)
Glucose, Bld: 162 mg/dL — ABNORMAL HIGH (ref 70–99)
TCO2: 25 mmol/L (ref 0–100)

## 2012-01-17 LAB — POCT ACTIVATED CLOTTING TIME: Activated Clotting Time: 180 seconds

## 2012-01-17 SURGERY — ABDOMINAL AORTAGRAM
Anesthesia: LOCAL

## 2012-01-17 MED ORDER — ALUM & MAG HYDROXIDE-SIMETH 200-200-20 MG/5ML PO SUSP: 15.0000 mL | ORAL | Status: DC | PRN

## 2012-01-17 MED ORDER — MIDAZOLAM HCL 2 MG/2ML IJ SOLN
INTRAMUSCULAR | Status: AC
  Filled 2012-01-17: qty 2

## 2012-01-17 MED ORDER — MORPHINE SULFATE 10 MG/ML IJ SOLN: 2.0000 mg | INTRAMUSCULAR | Status: DC | PRN

## 2012-01-17 MED ORDER — LABETALOL HCL 5 MG/ML IV SOLN
10.0000 mg | INTRAVENOUS | Status: DC | PRN
  Filled 2012-01-17: qty 4

## 2012-01-17 MED ORDER — FENTANYL CITRATE 0.05 MG/ML IJ SOLN
INTRAMUSCULAR | Status: AC
  Filled 2012-01-17: qty 2

## 2012-01-17 MED ORDER — ONDANSETRON HCL 4 MG/2ML IJ SOLN: 4.0000 mg | Freq: Four times a day (QID) | INTRAMUSCULAR | Status: DC | PRN

## 2012-01-17 MED ORDER — SODIUM CHLORIDE 0.9 % IV SOLN: 1.0000 mL/kg/h | INTRAVENOUS | Status: DC

## 2012-01-17 MED ORDER — HEPARIN SODIUM (PORCINE) 1000 UNIT/ML IJ SOLN
INTRAMUSCULAR | Status: AC
  Filled 2012-01-17: qty 1

## 2012-01-17 MED ORDER — METOPROLOL TARTRATE 1 MG/ML IV SOLN: 2.0000 mg | INTRAVENOUS | Status: DC | PRN

## 2012-01-17 MED ORDER — MEPERIDINE HCL 50 MG PO TABS
50.0000 mg | ORAL_TABLET | ORAL | Status: DC | PRN
  Administered 2012-01-17 – 2012-01-18 (×3): 50 mg via ORAL
  Filled 2012-01-17 (×3): qty 1

## 2012-01-17 MED ORDER — PROCHLORPERAZINE MALEATE 10 MG PO TABS
10.0000 mg | ORAL_TABLET | Freq: Three times a day (TID) | ORAL | Status: DC | PRN
  Filled 2012-01-17: qty 1

## 2012-01-17 MED ORDER — PHENOL 1.4 % MT LIQD
1.0000 | OROMUCOSAL | Status: DC | PRN
  Filled 2012-01-17: qty 177

## 2012-01-17 MED ORDER — HYDRALAZINE HCL 20 MG/ML IJ SOLN
10.0000 mg | INTRAMUSCULAR | Status: DC | PRN
  Filled 2012-01-17: qty 0.5

## 2012-01-17 MED ORDER — SODIUM CHLORIDE 0.9 % IV SOLN: INTRAVENOUS | Status: DC

## 2012-01-17 MED ORDER — GUAIFENESIN-DM 100-10 MG/5ML PO SYRP: 15.0000 mL | ORAL_SOLUTION | ORAL | Status: DC | PRN

## 2012-01-17 MED ORDER — HEPARIN (PORCINE) IN NACL 2-0.9 UNIT/ML-% IJ SOLN
INTRAMUSCULAR | Status: AC
  Filled 2012-01-17: qty 1000

## 2012-01-17 MED ORDER — LIDOCAINE HCL (PF) 1 % IJ SOLN
INTRAMUSCULAR | Status: AC
  Filled 2012-01-17: qty 30

## 2012-01-17 NOTE — H&P (View-Only) (Signed)
[facility] [place] [facility] Office Note  CC: Patient with complaints of right lower extremity/foot coolness Referring Physician: Early  History of Present Illness: 66 year old female patient of Dr. Arbie Cookey that is an extensive peripheral vascular history. The patient called stating that her right foot is beginning to hurt and feels "cool" and over the past few days she's starting to have the same symptoms on the left side. The ABIs today have dropped on the right from her previous study in April. The patient does not indicate any particular event that may have been a precursor to her current problems, she states the symptoms have slowly worsened over the past 4-5 weeks.  Past Medical History  Diagnosis Date  . Hypertension     Unspecified  . Coronary artery disease     a. s/p NSTEMI 02/2009 - PCI LCX with Xience DES. Otherwise branch vessel and Dist. RCA dzs. NL EF.   Marland Kitchen Hyperlipidemia     Mixed  . Diabetes mellitus   . Tobacco abuse     Remote  . Peripheral vascular disease     unspecified, a. s/p L CEA b. s/p B fem-pop bypass  . Loose, teeth     has loose bridge and two loose teeth holding it    ROS: [x]  Positive   [ ]  Denies    General: [ ]  Weight loss, [ ]  Fever, [ ]  chills Neurologic: [ ]  Dizziness, [ ]  Blackouts, [ ]  Seizure [ ]  Stroke, [ ]  "Mini stroke", [ ]  Slurred speech, [ ]  Temporary blindness; [ ]  weakness in arms or legs, [ ]  Hoarseness Cardiac: [ ]  Chest pain/pressure, [ ]  Shortness of breath at rest [ ]  Shortness of breath with exertion, [ ]  Atrial fibrillation or irregular heartbeat Vascular: [ ]  Pain in legs with walking, [ ]  Pain in legs at rest, [ ]  Pain in legs at night,  [ ]  Non-healing ulcer, [ ]  Blood clot in vein/DVT,   Pulmonary: [ ]  Home oxygen, [ ]  Productive cough, [ ]  Coughing up blood, [ ]  Asthma,  [ ]  Wheezing Musculoskeletal:  [ ]  Arthritis, [ ]  Low back pain, [ ]  Joint pain Hematologic: [ ]  Easy Bruising, [ ]  Anemia; [ ]   Hepatitis Gastrointestinal: [ ]  Blood in stool, [ ]  Gastroesophageal Reflux/heartburn, [ ]  Trouble swallowing Urinary: [ ]  chronic Kidney disease, [ ]  on HD - [ ]  MWF or [ ]  TTHS, [ ]  Burning with urination, [ ]  Difficulty urinating Skin: [ ]  Rashes, [ ]  Wounds Psychological: [ ]  Anxiety, [ ]  Depression   Social History History  Substance Use Topics  . Smoking status: Former Smoker    Types: Cigarettes    Quit date: 07/05/2001  . Smokeless tobacco: Not on file  . Alcohol Use: 7.0 oz/week    14 drink(s) per week    Family History Family History  Problem Relation Age of Onset  . Heart attack Mother   . Heart attack Father   . Hypertension Brother   . Diabetes Brother   . Hypertension Brother     Allergies  Allergen Reactions  . Acetaminophen Other (See Comments)    Does not tolerate well  . Codeine Nausea And Vomiting  . Erythromycin Nausea And Vomiting  . Hydrocodone-Acetaminophen Nausea And Vomiting  . Hydromorphone Nausea And Vomiting  . Ibuprofen Other (See Comments)    Does not tolerate well  . Propoxyphene Hcl Nausea And Vomiting  . Statins Other (See Comments)  Leg cramps    Current Outpatient Prescriptions  Medication Sig Dispense Refill  . ALPRAZolam (XANAX) 0.25 MG tablet Take 0.25 mg by mouth daily.       Marland Kitchen aspirin 81 MG tablet Take 81 mg by mouth daily.       . calcium carbonate (TUMS) 500 MG chewable tablet Chew 1-4 tablets by mouth daily as needed. For acid reflux. Pt can take 1 to 4 tabs as needed for reflux      . clopidogrel (PLAVIX) 75 MG tablet Take 75 mg by mouth daily.      Marland Kitchen dexlansoprazole (DEXILANT) 60 MG capsule Take 60 mg by mouth 2 (two) times daily.      Marland Kitchen glyBURIDE (DIABETA) 2.5 MG tablet Take 2.5 mg by mouth daily.       . hydrochlorothiazide 25 MG tablet Take 25 mg by mouth daily.       . isosorbide mononitrate (IMDUR) 30 MG 24 hr tablet Take 15 mg by mouth 2 (two) times daily.       . metoprolol succinate (TOPROL-XL) 25 MG 24 hr  tablet Take 25 mg by mouth daily.       . nitroGLYCERIN (NITROSTAT) 0.4 MG SL tablet Place 0.4 mg under the tongue every 5 (five) minutes as needed. For chest pain      . omega-3 acid ethyl esters (LOVAZA) 1 G capsule Take 2 g by mouth 2 (two) times daily.       . pioglitazone-metformin (ACTOPLUS MET) 15-500 MG per tablet Take 1 tablet by mouth daily. Brand name only-does not work if components are given separately per patient      . ramipril (ALTACE) 1.25 MG capsule Take 1.25 mg by mouth daily.      . sitaGLIPtan (JANUVIA) 100 MG tablet Take 100 mg by mouth daily.       Marland Kitchen amoxicillin (AMOXIL) 500 MG capsule       . cephALEXin (KEFLEX) 500 MG capsule       . ciprofloxacin (CIPRO) 250 MG tablet Take 250 mg by mouth 2 (two) times daily. For 7 days started on 09-19-11      . iron polysaccharides (NIFEREX) 150 MG capsule Take 150 mg by mouth 2 (two) times daily.       . prochlorperazine (COMPAZINE) 10 MG tablet Take 10 mg by mouth every 8 (eight) hours as needed. For nausea      . sulfamethoxazole-trimethoprim (BACTRIM DS) 800-160 MG per tablet Take 2 tablets by mouth 2 (two) times daily.      . Vitamin D, Ergocalciferol, (DRISDOL) 50000 UNITS CAPS Take 50,000 Units by mouth every Monday, Wednesday, and Friday.         Physical Examination  Filed Vitals:   01/10/12 1318  BP: 82/62  Pulse: 80  Resp: 14    Body mass index is 27.12 kg/(m^2).  General:  WDWN in NAD Gait: Normal HEENT: WNL Eyes: Pupils equal Pulmonary: normal non-labored breathing , without Rales, rhonchi,  wheezing Cardiac: RRR, without  Murmurs, rubs or gallops; No carotid bruits Abdomen: soft, NT, no masses Skin: no rashes, ulcers noted Vascular Exam/Pulses: 1+ femoral pulse on the right, 3+ femoral pulse on the left lower extremity pulses on the right are not palpable  Extremities without ischemic changes, no Gangrene , no cellulitis; no open wounds;  Musculoskeletal: no muscle wasting or atrophy  Neurologic: A&O X  3; Appropriate Affect ; SENSATION: normal; MOTOR FUNCTION:  moving all extremities equally. Speech is fluent/normal  Non-Invasive  Vascular Imaging: ABI today is 0.30 monophasic PT on the right, no wave on the DP. The patient is 0.72 and biphasic on the left which is unchanged from April  ASSESSMENT/PLAN: I discussed current situation with Dr. Hart Rochester who recommends the patient undergo an aortic iliac study with runoff and possible intervention with Dr. Myra Gianotti or Dr. Arbie Cookey next week, the patient's husband has requested Dr. Myra Gianotti to do the study sooner than next week, Okey Regal our triage nurse is working with them to get this scheduled soon as possible. The patient's followup after the study will be pending which physician  can do the procedure and when.  Lauree Chandler ANP  Clinic M.D.: Hart Rochester

## 2012-01-17 NOTE — Progress Notes (Signed)
Dr. Darrick Penna aware of patient refusal for IV start.

## 2012-01-17 NOTE — Progress Notes (Addendum)
REPORT CALLED TO TINA RN FOR [phone] AND TRANSFERRED VIA BED

## 2012-01-17 NOTE — Progress Notes (Signed)
Patient arrived via bed from short stay for overnight observation.Patient refusing to have IV started and patient asking that demerol be given every 4 hours.Explained to patient and husband that IV needed in event of emergency .Patient continues to refuse .Explained to patient order for demerol is every 4 hours as needed and patient would need to ask for medication and that this nurse would have to get medication from pharmacy not kept on floor.Patient husband yelled at me then go get it then.Explained that I had to call pharmacy to get it ready and that they would give me a pick up time.  me

## 2012-01-17 NOTE — Interval H&P Note (Signed)
History and Physical Interval Note:  01/17/2012 8:05 AM  [name]  has presented today for surgery, with the diagnosis of PVD  The various methods of treatment have been discussed with the patient and family. After consideration of risks, benefits and other options for treatment, the patient has consented to  Procedure(s) (LRB): ABDOMINAL AORTAGRAM (N/A) as a surgical intervention .  The patient's history has been reviewed, patient examined, no change in status, stable for surgery.  I have reviewed the patients' chart and labs.  Questions were answered to the patient's satisfaction.     [name] IV, V. WELLS

## 2012-01-17 NOTE — Telephone Encounter (Addendum)
Message copied by Rosalyn Charters on Tue Jan 17, 2012  3:40 PM ------      Message from: Sharee Pimple      Created: Tue Jan 17, 2012  2:32 PM      Regarding: schedule                   ----- Message -----         From: Melene Plan, RN         Sent: 01/17/2012   2:17 PM           To: Sharee Pimple, CMA, [facility]                        ----- Message -----         From: Nada Libman, MD         Sent: 01/17/2012  12:51 PM           To: Reuel Derby, Melene Plan, RN            01/17/2012, the patient had the following procedures:       1.  ultrasound access and placement of  Left common                       femoral vein central line       2.  ultrasound access left common femoral artery       3.  abdominal aortogram       4.  bilateral lower extremity runoff       5.  Angioplasty right external iliac artery       6.  angioplasty right common femoral artery             Please have the patient come back to see Dr. Arbie Cookey in one month with a duplex ultrasound and ankle-brachial indices.  notified patient of fu appt. with dr. Myra Gianotti on 02-28-12 9:30 l/v/m

## 2012-01-17 NOTE — Progress Notes (Signed)
DR FIELDS NOTIFIED OF CLIENT'S GROIN SOFTER AFTER PRESSURE HELD AND BRUITS BILAT GROINS; ORDERS NOTED

## 2012-01-17 NOTE — Progress Notes (Signed)
C/O LEFT GROIN HURTING 6/10 AND PRESSURE DRESSING REMOVED AND 3X5CM AREA OF FIRMNESS NOTED AND DR FIELDS NOTIFIED AND ORDER TO KEEP CLIENT OVERNIGHT; PRESSURE HELD LEFT GROIN FOR AND GROIN SOFT AFTER HOLDING PRESSURE; CLIENT STATES PAIN RELIEVED LEFT GROIN AFTER PRESSURE HELD; BRUIT HEARD BILAT GROINS

## 2012-01-17 NOTE — Op Note (Signed)
[facility]  Patient name: [name] MRN: [id number] DOB: 1946-06-27 Sex: female  01/17/2012 Pre-operative Diagnosis: Right leg pain Post-operative diagnosis:  Same Surgeon:  Jorge Ny Procedure Performed:  1.  ultrasound access and placement of  Left common femoral vein central line  2.  ultrasound access left common femoral artery  3.  abdominal aortogram  4.  bilateral lower extremity runoff  5.  Angioplasty right external iliac artery  6.  angioplasty right common femoral artery     Indications:  The patient undergone multiple percutaneous and surgical revascularization procedures in the past. She comes in with a several day history of redness and pain in her right foot. Ultrasound revealed decreased ankle brachial indices. She is here today for further evaluation and intervention  Procedure:  The patient did not have IV access and therefore I elected to place a left femoral vein central line. The left common femoral vein was evaluated with ultrasound and found to be widely patent and easily compressible. It was accessed under ultrasound guidance with an 18-gauge needle. The 035 wire was advanced into the inferior vena cava under fluoroscopic visualization and a 5 French sheath was placed as a central venous catheter.   The patient was identified in the holding area and taken to room 8.  The patient was then placed supine on the table and prepped and draped in the usual sterile fashion.  A time out was called.  Ultrasound was used to evaluate the left common femoral artery.  It was patent .  A digital ultrasound image was acquired.  A micropuncture needle was used to access the left common femoral artery under ultrasound guidance.  An 018 wire was advanced without resistance and a micropuncture sheath was placed.  The 018 wire was removed and a benson wire was placed.  The micropuncture sheath was exchanged for a 5 french sheath.  An omniflush  catheter was advanced over the wire to the level of L-1.  An abdominal angiogram was obtained.  Next, using the omniflush catheter and a benson wire, the aortic bifurcation was crossed and the catheter was placed into theright external iliac artery and right runoff was obtained.  left runoff was performed via retrograde sheath injections.  Findings:   Aortogram:  The visualized portions of the suprarenal abdominal aorta showed no significant disease. There is no evidence of renal artery stenosis. The infrarenal abdominal aorta is widely patent the bilateral common iliac arteries are patent throughout her course. There is a high-grade stenosis within the right external iliac stent which extends into the right common femoral artery.  Right Lower Extremity:  There is a patulous dilatation of the right femoral artery in its midportion correlating with previous surgical repair. The proximal common femoral artery has diffuse disease and narrowing of approximately 60%. The profunda femoral artery is patent. The bypass graft in the is patent without evidence of stenosis. They're single vessel runoff via the peroneal artery. Collaterals opacify the foot.right leg  Left Lower Extremity:   the left common femoral artery has diffuse stenosis proximally which is nearly sheath occlusive. There is a high-grade stenosis at the origin of the left profunda femoral artery. The left femoral to above-knee popliteal artery bypass graft is widely patent. There is two-vessel runoff via the posterior tibial and peroneal artery.   Intervention:   based on the above images the decision was made to proceed with intervention. Over a Amplatz superstiff wire a 5 [place] Ansel  1 sheath was advanced into the right external iliac artery. The patient was then fully heparinized. I then used a 014 wire to cross the stenosis. A 5 x 40 balloon was used to perform balloon angioplasty of the in-stent stenosis in the right external iliac artery. The  balloon was taken 14 atmospheres for 2 minutes. A followup study revealed significantly and proved results across the stenosis. There was a segment of the common femoral artery distal to the iliac stent and proximal to the patch repair the common femoral artery which also was stenotic. I elected to proceed with angioplasty of the common femoral artery. A 5 x 40 balloon was taken to profile across this area which was 6 atmospheres. It was held up for 2 minutes. A completion study revealed significantly improved blood flow to the right external iliac and common femoral artery. At this point the decision was made to terminate the procedure. The sheath was withdrawn to the left external iliac artery and the patient will be taken to the holding area for sheath pull once her correct relation profile corrects.   Impression:  #1  successful balloon angioplasty of in-stent stenosis within the right external iliac artery using a 5 mm balloon.  #2  successful angioplasty of the right common femoral artery using a 5 mm balloon.  #3  high-grade left proximal profundofemoral artery stenosis    V. [name], M.D. [facility] [place] [facility] Office: [phone] Pager:  [phone]

## 2012-01-18 MED ORDER — MEPERIDINE HCL 50 MG PO TABS: 50.0000 mg | ORAL_TABLET | Freq: Four times a day (QID) | ORAL | Status: AC | PRN

## 2012-01-18 NOTE — Progress Notes (Signed)
[facility]  Subjective  - POD # 1  Procedure Performed: Right leg 1. ultrasound access and placement of Left common femoral vein central line  2. ultrasound access left common femoral artery  3. abdominal aortogram  4. bilateral lower extremity runoff  5. Angioplasty right external iliac artery  6. angioplasty right common femoral artery She had post-op hematoma formation and was admitted for observation.  Objective 144/49 72 97.7 F (36.5 C) (Oral) 16 95%  Intake/Output Summary (Last 24 hours) at 01/18/12 0834 Last data filed at 01/18/12 0500  Gross per 24 hour  Intake      0 ml  Output   1475 ml  Net  -1475 ml      Assessment/Planning: POD #1  Left groin is soft without active bleeding or hematoma. Doppler PT bilateral monophasic  Skin is clean, warm and dry bilateral lower extremities.  [facility] 01/18/2012 8:34 AM --  Laboratory Lab Results:  Basename 01/17/12 1112  WBC --  HGB 13.3  HCT 39.0  PLT --   BMET  Basename 01/17/12 1112  NA 138  K 3.9  CL 103  CO2 --  GLUCOSE 162*  BUN 14  CREATININE 0.80  CALCIUM --    COAG Lab Results  Component Value Date   INR 0.91 08/22/2011   INR 0.95 09/06/2010   INR 0.89 07/09/2010   No results found for this basename: PTT    Antibiotics Anti-infectives    None

## 2012-01-18 NOTE — Discharge Summary (Signed)
[facility] Discharge Summary   Patient ID:  [name] MRN: [id number] DOB/AGE: 02/24/1946 66 y.o.  Admit date: 01/17/2012 Discharge date: 01/18/2012 Date of Surgery: 01/17/2012 Surgeon: Surgeon(s): Nada Libman, MD  Admission Diagnosis: PVD  Discharge Diagnoses:  PVD  Secondary Diagnoses: Past Medical History  Diagnosis Date  . Hypertension     Unspecified  . Coronary artery disease     a. s/p NSTEMI 02/2009 - PCI LCX with Xience DES. Otherwise branch vessel and Dist. RCA dzs. NL EF.   Marland Kitchen Hyperlipidemia     Mixed  . Tobacco abuse     Remote  . Peripheral vascular disease     unspecified, a. s/p L CEA b. s/p B fem-pop bypass  . Loose, teeth     has loose bridge and two loose teeth holding it  . Raynaud disease   . PONV (postoperative nausea and vomiting)   . Type II diabetes mellitus   . GERD (gastroesophageal reflux disease)   . Anxiety     Procedure(s): ABDOMINAL AORTAGRAM PTA PERIPHERAL ARTERY  Discharged Condition: good  HPI:  CC: Patient with complaints of right lower extremity/foot coolness  Referring Physician: Early  History of Present Illness: 66 year old female patient of Dr. Arbie Cookey that is an extensive peripheral vascular history. The patient called stating that her right foot is beginning to hurt and feels "cool" and over the past few days she's starting to have the same symptoms on the left side. The ABIs today have dropped on the right from her previous study in April. The patient does not indicate any particular event that may have been a precursor to her current problems, she states the symptoms have slowly worsened over the past 4-5 weeks.   Hospital Course:  [name] is a 66 y.o. female is S/P Right Procedure(s): ABDOMINAL AORTAGRAM PTA PERIPHERAL ARTERY Extubated: POD # 0 Post-op wounds healing well Pt. Ambulating, voiding and taking PO diet without difficulty. Pt pain controlled with PO pain meds. Labs as  below Complications:Hematoma formation  Consults:     Significant Diagnostic Studies: CBC Lab Results  Component Value Date   WBC 6.2 08/23/2011   HGB 13.3 01/17/2012   HCT 39.0 01/17/2012   MCV 90.6 08/23/2011   PLT 190 08/23/2011    BMET    Component Value Date/Time   NA 138 01/17/2012 1112   K 3.9 01/17/2012 1112   CL 103 01/17/2012 1112   CO2 28 08/23/2011 0447   GLUCOSE 162* 01/17/2012 1112   BUN 14 01/17/2012 1112   CREATININE 0.80 01/17/2012 1112   CALCIUM 8.3* 08/23/2011 0447   GFRNONAA >90 08/23/2011 0447   GFRAA >90 08/23/2011 0447   COAG Lab Results  Component Value Date   INR 0.91 08/22/2011   INR 0.95 09/06/2010   INR 0.89 07/09/2010     Disposition:  Discharge to :Home Discharge Orders    Future Appointments: Provider: Department: Dept Phone: Center:   02/28/2012 9:30 AM [facility] [phone] [facility]   02/28/2012 10:30 AM [facility] [phone] [facility]   02/28/2012 11:00 AM Larina Earthly, MD [facility] [phone] [facility]      [place], [place] [facility] Medication Instructions [id number]   Printed on:01/18/12 0840  Medication Information                    pioglitazone-metformin (ACTOPLUS MET) 15-500 MG per tablet Take 1 tablet by mouth daily. Brand name only-does not work if components are given  separately per patient           ALPRAZolam (XANAX) 0.25 MG tablet Take 0.25 mg by mouth daily.            aspirin 81 MG tablet Take 81 mg by mouth daily.            glyBURIDE (DIABETA) 2.5 MG tablet Take 2.5 mg by mouth daily.            hydrochlorothiazide 25 MG tablet Take 25 mg by mouth daily.            sitaGLIPtan (JANUVIA) 100 MG tablet Take 100 mg by mouth daily.            omega-3 acid ethyl esters (LOVAZA) 1 G capsule Take 2 g by mouth 2 (two) times daily.            nitroGLYCERIN (NITROSTAT) 0.4 MG SL tablet Place 0.4 mg under the tongue every 5 (five) minutes as needed. For chest pain           metoprolol succinate  (TOPROL-XL) 25 MG 24 hr tablet Take 25 mg by mouth daily.            calcium carbonate (TUMS) 500 MG chewable tablet Chew 1-4 tablets by mouth daily as needed. For acid reflux. Pt can take 1 to 4 tabs as needed for reflux           dexlansoprazole (DEXILANT) 60 MG capsule Take 60 mg by mouth 2 (two) times daily.           clopidogrel (PLAVIX) 75 MG tablet Take 75 mg by mouth daily.           ramipril (ALTACE) 1.25 MG capsule Take 1.25 mg by mouth daily.           prochlorperazine (COMPAZINE) 10 MG tablet Take 10 mg by mouth every 8 (eight) hours as needed. For nausea           Cholecalciferol (VITAMIN D) 2000 UNITS CAPS Take by mouth. Takes 3 caps in the morning and 2 at night           Probiotic Product (ACIDOPHILUS PEARLS PO) Take 1 capsule by mouth daily.           isosorbide mononitrate (IMDUR) 30 MG 24 hr tablet Take 15 mg by mouth 2 (two) times daily.            Verbal and written Discharge instructions given to the patient. Wound care per Discharge AVS Call office for appointment with Dr. Arbie Cookey  Signed: Thomasena Edis, EMMA [facility] 01/18/2012, 8:40 AM

## 2012-01-20 NOTE — Procedures (Unsigned)
BYPASS GRAFT EVALUATION  INDICATION:  Coldness and numbness at the bottom of feet.  HISTORY: Diabetes:  Yes Cardiac:  No Hypertension:  Yes Smoking:  Previous Previous Surgery:  Bilateral CIA, right EIA, and right popliteal stents on 03/30/2010. Left femoral to popliteal bypass graft, 07/09/2007; left femoral endarterectomy, 07/23/2010. Right femoral to popliteal graft revision, 10/30/2007, with BK jump graft, 09/15/2010. Thrombectomy and revision of right femoral to popliteal BPG, August 22, 2011. Angioplasty of right iliac, 09/21/2011.  SINGLE LEVEL ARTERIAL EXAM                              RIGHT              LEFT Brachial: Anterior tibial: Posterior tibial: Peroneal: Ankle/brachial index:        0.30               0.72  PREVIOUS ABI:  Date: 10/11/2011  RIGHT:  0.65  LEFT:  0.69  LOWER EXTREMITY BYPASS GRAFT DUPLEX EXAM:  DUPLEX:  Stenosis with elevated velocities noted in the right external iliac artery. Patent right femoral to popliteal bypass graft with below-knee jump graft with no evidence of stenosis within the graft. Patent left femoral to popliteal bypass graft without evidence of stenosis within the graft. Elevated velocities noted in the left external iliac artery.  IMPRESSION: 1. Patent right and left femoral to popliteal grafts. 2. Elevated velocities with turbulent flow noted in the bilateral     external iliac arteries. 3. Right ankle/brachial indices are significantly declined in     comparison to the previous exam while the left ABI remains stable.  ___________________________________________ Larina Earthly, M.D.  EM/MEDQ  D:  01/10/2012  T:  01/10/2012  Job:  [id number]

## 2012-01-23 NOTE — Discharge Summary (Signed)
Agree with above  [name] 

## 2012-01-31 ENCOUNTER — Telehealth: Payer: Self-pay | Admitting: Cardiovascular Disease

## 2012-01-31 ENCOUNTER — Other Ambulatory Visit: Payer: Self-pay | Admitting: Cardiovascular Disease

## 2012-01-31 NOTE — Telephone Encounter (Signed)
Scheduled 1 year appointment for patient

## 2012-01-31 NOTE — Telephone Encounter (Signed)
New msg °Pt wants to discuss her meds. Please call °

## 2012-02-14 ENCOUNTER — Ambulatory Visit: Payer: Medicare Other | Admitting: Vascular Surgery

## 2012-02-20 ENCOUNTER — Other Ambulatory Visit: Payer: Self-pay | Admitting: *Deleted

## 2012-02-20 ENCOUNTER — Encounter: Payer: Self-pay | Admitting: Vascular Surgery

## 2012-02-20 ENCOUNTER — Encounter (INDEPENDENT_AMBULATORY_CARE_PROVIDER_SITE_OTHER): Payer: Medicare Other | Admitting: *Deleted

## 2012-02-20 ENCOUNTER — Other Ambulatory Visit: Payer: Self-pay | Admitting: Cardiovascular Disease

## 2012-02-20 DIAGNOSIS — I70219 Atherosclerosis of native arteries of extremities with intermittent claudication, unspecified extremity: Secondary | ICD-10-CM

## 2012-02-20 DIAGNOSIS — I739 Peripheral vascular disease, unspecified: Secondary | ICD-10-CM

## 2012-02-20 DIAGNOSIS — Z48812 Encounter for surgical aftercare following surgery on the circulatory system: Secondary | ICD-10-CM

## 2012-02-21 ENCOUNTER — Encounter: Payer: Self-pay | Admitting: Vascular Surgery

## 2012-02-21 ENCOUNTER — Ambulatory Visit (INDEPENDENT_AMBULATORY_CARE_PROVIDER_SITE_OTHER): Payer: Medicare Other | Admitting: Vascular Surgery

## 2012-02-21 VITALS — BP 99/74 | HR 84 | Ht 64.0 in | Wt 158.0 lb

## 2012-02-21 DIAGNOSIS — Z48812 Encounter for surgical aftercare following surgery on the circulatory system: Secondary | ICD-10-CM

## 2012-02-21 DIAGNOSIS — I70219 Atherosclerosis of native arteries of extremities with intermittent claudication, unspecified extremity: Secondary | ICD-10-CM

## 2012-02-21 MED ORDER — CLOPIDOGREL BISULFATE 75 MG PO TABS: 75.0000 mg | ORAL_TABLET | Freq: Every day | ORAL | Status: DC

## 2012-02-21 NOTE — Progress Notes (Signed)
Patient has today as an add-on after duplex followup showed up in her right ankle arm index. He does have an extensive past history to include the lateral femoral popliteal bypasses and multiple lateral iliac artery stents. Most recently she had the stenting of her right iliac artery in March of this year. She does have right calf claudication but no tissue loss and no rest pain. She is leaving for a trip to [place] in approximately one week. She is here today with her husband.  Past Medical History  Diagnosis Date  . Hypertension     Unspecified  . Coronary artery disease     a. s/p NSTEMI 02/2009 - PCI LCX with Xience DES. Otherwise branch vessel and Dist. RCA dzs. NL EF.   Marland Kitchen Hyperlipidemia     Mixed  . Tobacco abuse     Remote  . Peripheral vascular disease     unspecified, a. s/p L CEA b. s/p B fem-pop bypass  . Loose, teeth     has loose bridge and two loose teeth holding it  . Raynaud disease   . PONV (postoperative nausea and vomiting)   . Type II diabetes mellitus   . GERD (gastroesophageal reflux disease)   . Anxiety     History  Substance Use Topics  . Smoking status: Former Smoker -- 2.0 packs/day for 39 years    Types: Cigarettes    Quit date: 07/05/2001  . Smokeless tobacco: Never Used  . Alcohol Use: 7.0 oz/week    14 drink(s) per week    Family History  Problem Relation Age of Onset  . Heart attack Mother   . Heart attack Father   . Hypertension Brother   . Diabetes Brother   . Hypertension Brother     Allergies  Allergen Reactions  . Codeine Nausea And Vomiting  . Erythromycin Nausea And Vomiting  . Hydrocodone-Acetaminophen Nausea And Vomiting  . Hydromorphone Nausea And Vomiting  . Propoxyphene Hcl Nausea And Vomiting  . Statins Other (See Comments)    Leg cramps  . Acetaminophen Other (See Comments)    Does not tolerate well  . Ibuprofen Other (See Comments)    Does not tolerate well    Current outpatient prescriptions:ALPRAZolam (XANAX) 0.25  MG tablet, Take 0.25 mg by mouth daily. , Disp: , Rfl: ;  aspirin 81 MG tablet, Take 81 mg by mouth daily. , Disp: , Rfl: ;  calcium carbonate (TUMS) 500 MG chewable tablet, Chew 1-4 tablets by mouth daily as needed. For acid reflux. Pt can take 1 to 4 tabs as needed for reflux, Disp: , Rfl:  Cholecalciferol (VITAMIN D) 2000 UNITS CAPS, Take by mouth. Takes 3 caps in the morning and 2 at night, Disp: , Rfl: ;  dexlansoprazole (DEXILANT) 60 MG capsule, Take 60 mg by mouth 2 (two) times daily., Disp: , Rfl: ;  glyBURIDE (DIABETA) 2.5 MG tablet, Take 2.5 mg by mouth daily. , Disp: , Rfl: ;  hydrochlorothiazide 25 MG tablet, Take 25 mg by mouth daily. , Disp: , Rfl:  isosorbide mononitrate (IMDUR) 30 MG 24 hr tablet, Take 15 mg by mouth 2 (two) times daily., Disp: , Rfl: ;  metoprolol succinate (TOPROL-XL) 25 MG 24 hr tablet, TAKE 1 TABLET EVERY DAY, Disp: 30 tablet, Rfl: 1;  nitroGLYCERIN (NITROSTAT) 0.4 MG SL tablet, Place 0.4 mg under the tongue every 5 (five) minutes as needed. For chest pain, Disp: , Rfl:  omega-3 acid ethyl esters (LOVAZA) 1 G capsule, Take 2  g by mouth 2 (two) times daily. , Disp: , Rfl: ;  pioglitazone-metformin (ACTOPLUS MET) 15-500 MG per tablet, Take 1 tablet by mouth daily. Brand name only-does not work if components are given separately per patient, Disp: , Rfl: ;  Probiotic Product (ACIDOPHILUS PEARLS PO), Take 1 capsule by mouth daily., Disp: , Rfl:  prochlorperazine (COMPAZINE) 10 MG tablet, Take 10 mg by mouth every 8 (eight) hours as needed. For nausea, Disp: , Rfl: ;  ramipril (ALTACE) 1.25 MG capsule, Take 1.25 mg by mouth daily., Disp: , Rfl: ;  sitaGLIPtan (JANUVIA) 100 MG tablet, Take 100 mg by mouth daily. , Disp: , Rfl: ;  clopidogrel (PLAVIX) 75 MG tablet, Take 1 tablet (75 mg total) by mouth daily., Disp: 30 tablet, Rfl: 9  BP 99/74  Pulse 84  Ht 5\' 4"  (1.626 m)  Wt 158 lb (71.668 kg)  BMI 27.12 kg/m2  Body mass index is 27.12 kg/(m^2).       Physical exam  well-developed white female no acute distress Feet are warm and well perfused bilaterally She does have palpable femoral pulses bilaterally  Vascular lab shows reduction in her right side down to 0.35 ankle arm index where it had been 0.6 in April  Impression and plan: Recurrent narrowing of her iliac angioplasty site. I had a long discussion with the patient and her husband present. I really would not personally cancel upcoming trip [place] based on the noninvasive studies. She does not have any increase in her claudication does not have any rest pain does not have any tissue loss. I did explain that certainly outside chance that she could have occlusion of her thigh graft while on a trip but this could be treated if it occurs. I did explain options of observation versus recurrent angioplasty versus aortofemoral bypass grafting. They're comfortable with this discussion will see me again in 3 months for repeat ankle arm index in reevaluation

## 2012-02-22 ENCOUNTER — Encounter: Payer: Self-pay | Admitting: Cardiovascular Disease

## 2012-02-22 ENCOUNTER — Ambulatory Visit (INDEPENDENT_AMBULATORY_CARE_PROVIDER_SITE_OTHER): Payer: Medicare Other | Admitting: Cardiovascular Disease

## 2012-02-22 VITALS — BP 156/56 | HR 83 | Ht 64.0 in | Wt 156.0 lb

## 2012-02-22 DIAGNOSIS — I6529 Occlusion and stenosis of unspecified carotid artery: Secondary | ICD-10-CM

## 2012-02-22 DIAGNOSIS — E785 Hyperlipidemia, unspecified: Secondary | ICD-10-CM

## 2012-02-22 DIAGNOSIS — I10 Essential (primary) hypertension: Secondary | ICD-10-CM

## 2012-02-22 DIAGNOSIS — I2510 Atherosclerotic heart disease of native coronary artery without angina pectoris: Secondary | ICD-10-CM

## 2012-02-22 NOTE — Assessment & Plan Note (Signed)
Statin intolerant. Followed by Dr Evlyn Kanner.

## 2012-02-22 NOTE — Assessment & Plan Note (Signed)
Surveillance duplex studies per [facility].

## 2012-02-22 NOTE — Patient Instructions (Addendum)
Your physician wants you to follow-up in: 1 year with Dr. Cooper.  You will receive a reminder letter in the mail two months in advance. If you don't receive a letter, please call our office to schedule the follow-up appointment.  

## 2012-02-22 NOTE — Assessment & Plan Note (Addendum)
Stable without anginal symptoms. Remains on DAPT with ASA and plavix, reasonable considering her extensive PAD. Will continue same Rx and follow-up in 12 months.  Addendum 03/20/2012: Chart reviewed and patient is scheduled for aortobifemoral bypass surgery. She was just recently seen in the office and was stable from a cardiac perspective. She has had no anginal symptoms since her PCI procedure a few years ago. Her 12-lead EKG is normal. There is no indication for preoperative stress testing. She can proceed with surgery with continuation of her medical program. She is greater than 12 months out from PCI so is acceptable to hold Plavix prior to surgery. I would be happy to see her if any problems arise postoperatively.  Tonny Bollman 03/20/2012 1:39 PM

## 2012-02-22 NOTE — Progress Notes (Signed)
HPI:  66 year old woman presenting for followup evaluation. She is followed for coronary disease. She has extensive vascular disease and is followed closely by Dr. early. She has a history of carotid endarterectomy and multiple lower extremity revascularization procedures. Her last coronary procedure was in 2010 when she underwent stenting of the left circumflex with a drug-eluting stent after presenting with non-ST elevation infarction. Her left ventricular ejection fraction has been normal. Her comorbidities include diabetes, hyperlipidemia, and hypertension.  Overall she is doing well. She denies chest pain or pressure. No dyspnea, edema, orthopnea, or PND. She has stable symptoms of intermittent calf claudication and continues to follow closely with Dr Arbie Cookey.  Outpatient Encounter Prescriptions as of 02/22/2012  Medication Sig Dispense Refill  . ALPRAZolam (XANAX) 0.25 MG tablet Take 0.25 mg by mouth daily.       Marland Kitchen aspirin 81 MG tablet Take 81 mg by mouth daily.       . calcium carbonate (TUMS) 500 MG chewable tablet Chew 1-4 tablets by mouth daily as needed. For acid reflux. Pt can take 1 to 4 tabs as needed for reflux      . Cholecalciferol (VITAMIN D) 2000 UNITS CAPS Take by mouth. Takes 3 caps in the morning and 2 at night      . clopidogrel (PLAVIX) 75 MG tablet Take 1 tablet (75 mg total) by mouth daily.  30 tablet  9  . dexlansoprazole (DEXILANT) 60 MG capsule Take 60 mg by mouth 2 (two) times daily.      Marland Kitchen glyBURIDE (DIABETA) 2.5 MG tablet Take 2.5 mg by mouth daily.       . hydrochlorothiazide 25 MG tablet Take 25 mg by mouth daily.       . isosorbide mononitrate (IMDUR) 30 MG 24 hr tablet Take 15 mg by mouth 2 (two) times daily.      . metoprolol succinate (TOPROL-XL) 25 MG 24 hr tablet TAKE 1 TABLET EVERY DAY  30 tablet  1  . nitroGLYCERIN (NITROSTAT) 0.4 MG SL tablet Place 0.4 mg under the tongue every 5 (five) minutes as needed. For chest pain      . omega-3 acid ethyl esters  (LOVAZA) 1 G capsule Take 2 g by mouth 2 (two) times daily.       . pioglitazone-metformin (ACTOPLUS MET) 15-500 MG per tablet Take 1 tablet by mouth daily. Brand name only-does not work if components are given separately per patient      . Probiotic Product (ACIDOPHILUS PEARLS PO) Take 1 capsule by mouth daily.      . prochlorperazine (COMPAZINE) 10 MG tablet Take 10 mg by mouth every 8 (eight) hours as needed. For nausea      . ramipril (ALTACE) 1.25 MG capsule Take 1.25 mg by mouth daily.      . sitaGLIPtan (JANUVIA) 100 MG tablet Take 100 mg by mouth daily.         Allergies  Allergen Reactions  . Codeine Nausea And Vomiting  . Erythromycin Nausea And Vomiting  . Hydrocodone-Acetaminophen Nausea And Vomiting  . Hydromorphone Nausea And Vomiting  . Propoxyphene Hcl Nausea And Vomiting  . Statins Other (See Comments)    Leg cramps  . Acetaminophen Other (See Comments)    Does not tolerate well  . Ibuprofen Other (See Comments)    Does not tolerate well    Past Medical History  Diagnosis Date  . Hypertension     Unspecified  . Coronary artery disease  a. s/p NSTEMI 02/2009 - PCI LCX with Xience DES. Otherwise branch vessel and Dist. RCA dzs. NL EF.   Marland Kitchen Hyperlipidemia     Mixed  . Tobacco abuse     Remote  . Peripheral vascular disease     unspecified, a. s/p L CEA b. s/p B fem-pop bypass  . Loose, teeth     has loose bridge and two loose teeth holding it  . Raynaud disease   . PONV (postoperative nausea and vomiting)   . Type II diabetes mellitus   . GERD (gastroesophageal reflux disease)   . Anxiety     ROS: Negative except as per HPI  BP 156/56  Pulse 83  Ht 5\' 4"  (1.626 m)  Wt 70.761 kg (156 lb)  BMI 26.78 kg/m2  PHYSICAL EXAM: Pt is alert and oriented, NAD HEENT: normal Neck: JVP - normal, carotids 2+= with bilateral bruits Lungs: CTA bilaterally CV: RRR without murmur or gallop Abd: soft, NT, Positive BS, no hepatomegaly Ext: no C/C/E Skin: warm/dry  no rash  EKG:  NSR 83 bpm, within nl limits  ASSESSMENT AND PLAN:

## 2012-02-22 NOTE — Assessment & Plan Note (Signed)
BP increased today but was normal in Dr Bosie Helper office yesterday and she reports good readings at home. Continue to follow.

## 2012-02-23 ENCOUNTER — Encounter: Payer: Self-pay | Admitting: Cardiovascular Disease

## 2012-02-23 NOTE — Telephone Encounter (Signed)
Pt was seen yesterday, [facility] will  not pay for a vacation rx for plavix, will be going out of the country and afraid if something happens will run out  Before she gets back, requesting samples to take with her , pls call [phone] by two if poss

## 2012-02-23 NOTE — Telephone Encounter (Signed)
This encounter was created in error - please disregard.

## 2012-02-23 NOTE — Telephone Encounter (Signed)
Left message to call back  

## 2012-02-28 ENCOUNTER — Ambulatory Visit: Payer: Medicare Other | Admitting: Vascular Surgery

## 2012-03-16 ENCOUNTER — Telehealth: Payer: Self-pay | Admitting: *Deleted

## 2012-03-16 NOTE — Telephone Encounter (Signed)
Patient [place] while at beach c/o worsening calf pain and tingling of limb. Stated she will be travelling back to [place] today and wanted to see Dr Arbie Cookey as soon as possible. An appointment was made for Tuesday, Sept 17, 2013 @ 9:30 and explained to her she may have a little wait due to working her into the schedule. Also she was told to go to ED with severe pain, coldness of limb or discoloration. I also explained to patient the need for elevation of legs when possible and walking at intervals while travelling. Patient understood these instructions.

## 2012-03-19 ENCOUNTER — Encounter: Payer: Self-pay | Admitting: Vascular Surgery

## 2012-03-20 ENCOUNTER — Telehealth: Payer: Self-pay | Admitting: Cardiovascular Disease

## 2012-03-20 ENCOUNTER — Encounter: Payer: Self-pay | Admitting: Vascular Surgery

## 2012-03-20 ENCOUNTER — Encounter: Payer: Self-pay | Admitting: *Deleted

## 2012-03-20 ENCOUNTER — Encounter (HOSPITAL_COMMUNITY): Payer: Self-pay | Admitting: Pharmacy Technician

## 2012-03-20 ENCOUNTER — Ambulatory Visit (INDEPENDENT_AMBULATORY_CARE_PROVIDER_SITE_OTHER): Payer: Medicare Other

## 2012-03-20 ENCOUNTER — Other Ambulatory Visit: Payer: Self-pay | Admitting: *Deleted

## 2012-03-20 ENCOUNTER — Ambulatory Visit (INDEPENDENT_AMBULATORY_CARE_PROVIDER_SITE_OTHER): Payer: Medicare Other | Admitting: Vascular Surgery

## 2012-03-20 VITALS — BP 120/77 | HR 89 | Resp 18 | Ht 64.0 in | Wt 156.7 lb

## 2012-03-20 DIAGNOSIS — M79609 Pain in unspecified limb: Secondary | ICD-10-CM

## 2012-03-20 DIAGNOSIS — Z01810 Encounter for preprocedural cardiovascular examination: Secondary | ICD-10-CM

## 2012-03-20 DIAGNOSIS — I739 Peripheral vascular disease, unspecified: Secondary | ICD-10-CM

## 2012-03-20 NOTE — Progress Notes (Signed)
The patient presents today for continued discussion of her lower surety arterial insufficiency. She has an extremely extensive past history. She has undergone staged bilateral femoral-popliteal bypasses and multiple interventions in her iliac arteries with the angioplasty and stenting bilaterally. She had a right external iliac artery plasty and stenting in March and July of this year. She's had very Brigett Estell recurrent stenoses in these. He underwent noninvasive studies and my last visit with her in our office on 02/20/2012 showing systolic velocities at in the 161 cm/s and greater in both external iliac arteries above her femoropopliteal bypasses. That time her right arm index on the right was 0.38 and was 0.65 on the left. She has returned from a trip with her husband to [place] and reports she is having progressively severe difficulty with walking and has severe right leg rest pain. She does have some very superficial ulceration on her right great toe but no other tissue loss.  Past Medical History  Diagnosis Date  . Hypertension     Unspecified  . Coronary artery disease     a. s/p NSTEMI 02/2009 - PCI LCX with Xience DES. Otherwise branch vessel and Dist. RCA dzs. NL EF.   Marland Kitchen Hyperlipidemia     Mixed  . Tobacco abuse     Remote  . Peripheral vascular disease     unspecified, a. s/p L CEA b. s/p B fem-pop bypass  . Loose, teeth     has loose bridge and two loose teeth holding it  . Raynaud disease   . PONV (postoperative nausea and vomiting)   . Type II diabetes mellitus   . GERD (gastroesophageal reflux disease)   . Anxiety     History  Substance Use Topics  . Smoking status: Former Smoker -- 2.0 packs/day for 39 years    Types: Cigarettes    Quit date: 07/05/2001  . Smokeless tobacco: Never Used  . Alcohol Use: 7.0 oz/week    14 drink(s) per week    Family History  Problem Relation Age of Onset  . Heart attack Mother   . Heart attack Father   . Hypertension Brother   . Diabetes  Brother   . Hypertension Brother     Allergies  Allergen Reactions  . Codeine Nausea And Vomiting  . Erythromycin Nausea And Vomiting  . Hydrocodone-Acetaminophen Nausea And Vomiting  . Hydromorphone Nausea And Vomiting  . Propoxyphene Hcl Nausea And Vomiting  . Statins Other (See Comments)    Leg cramps  . Acetaminophen Other (See Comments)    Does not tolerate well  . Ibuprofen Other (See Comments)    Does not tolerate well    Current outpatient prescriptions:ALPRAZolam (XANAX) 0.25 MG tablet, Take 0.25 mg by mouth daily. , Disp: , Rfl: ;  aspirin 81 MG tablet, Take 81 mg by mouth daily. , Disp: , Rfl: ;  calcium carbonate (TUMS) 500 MG chewable tablet, Chew 1-4 tablets by mouth daily as needed. For acid reflux. Pt can take 1 to 4 tabs as needed for reflux, Disp: , Rfl:  Cholecalciferol (VITAMIN D) 2000 UNITS CAPS, Take by mouth. Takes 25,000 units three times weekly, Disp: , Rfl: ;  clopidogrel (PLAVIX) 75 MG tablet, Take 1 tablet (75 mg total) by mouth daily., Disp: 30 tablet, Rfl: 9;  dexlansoprazole (DEXILANT) 60 MG capsule, Take 60 mg by mouth 2 (two) times daily., Disp: , Rfl: ;  glyBURIDE (DIABETA) 2.5 MG tablet, Take 2.5 mg by mouth daily. , Disp: , Rfl:  hydrochlorothiazide  25 MG tablet, Take 25 mg by mouth daily. , Disp: , Rfl: ;  iron polysaccharides (NIFEREX) 150 MG capsule, Take 150 mg by mouth 2 (two) times daily., Disp: , Rfl: ;  isosorbide mononitrate (IMDUR) 30 MG 24 hr tablet, Take 15 mg by mouth 2 (two) times daily., Disp: , Rfl: ;  metoprolol succinate (TOPROL-XL) 25 MG 24 hr tablet, TAKE 1 TABLET EVERY DAY, Disp: 30 tablet, Rfl: 1 nitroGLYCERIN (NITROSTAT) 0.4 MG SL tablet, Place 0.4 mg under the tongue every 5 (five) minutes as needed. For chest pain, Disp: , Rfl: ;  omega-3 acid ethyl esters (LOVAZA) 1 G capsule, Take 2 g by mouth 2 (two) times daily. , Disp: , Rfl: ;  pioglitazone-metformin (ACTOPLUS MET) 15-500 MG per tablet, Take 1 tablet by mouth daily. Brand name  only-does not work if components are given separately per patient, Disp: , Rfl:  PLAVIX 75 MG tablet, TAKE 1 TABLET BY MOUTH EVERY DAY WITH A MEAL, Disp: 30 tablet, Rfl: 11;  Probiotic Product (ACIDOPHILUS PEARLS PO), Take 1 capsule by mouth daily., Disp: , Rfl: ;  prochlorperazine (COMPAZINE) 10 MG tablet, Take 10 mg by mouth every 8 (eight) hours as needed. For nausea, Disp: , Rfl: ;  ramipril (ALTACE) 1.25 MG capsule, Take 1.25 mg by mouth daily., Disp: , Rfl:  sitaGLIPtan (JANUVIA) 100 MG tablet, Take 100 mg by mouth daily. , Disp: , Rfl:   BP 120/77  Pulse 89  Resp 18  Ht 5\' 4"  (1.626 m)  Wt 156 lb 11.2 oz (71.079 kg)  BMI 26.90 kg/m2  Body mass index is 26.90 kg/(m^2).       Physical exam: Well-developed well-nourished white female appearing stated age Heart regular rate and rhythm Chest clear bilaterally Radial pulses 2+ radial she has a 1-2+ left femoral and absent right femoral pulses. I do not palpate distal pulses bilaterally. She does have dependent rubor in her right foot and no significant tissue loss of the and superficial ulceration the tip of her right great toe Neurologically she is grossly intact  I reviewed her most recent angiograms and discuss these length again with the patient and her husband present. I explained the options of stenting versus bifemoral bypass. I have been telling her for approximately one to 2 years felt that she would be best served with her to femoral bypass due to her Starleen Trussell recurrent disease or external iliac. 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 I discussed the magnitude of aortofemoral bypass grafting including a 2-3% chance of mortality and a 10-15% major morbidity to include cardiac pulmonary renal or other. I explained the low risk of infection and a devastating consequences of this. They understand and wish to proceed with surgery. We have scheduled this for 03/26/2012. She will see  Dr. Excell Seltzer for cardiac clearance prior to this. I discussed the typical 1-2 day intensive care unit stay and 5-7 day total hospitalization assuming no complications

## 2012-03-20 NOTE — Telephone Encounter (Signed)
I spoke with [name] and made her aware that Dr Excell Seltzer did an addendum on 02/22/12 office note in regards to surgical clearance.

## 2012-03-20 NOTE — Telephone Encounter (Signed)
Aorta Bifemoral Bypass Graft Surgery scheduled on 03/26/12.  I will forward this message to Dr Excell Seltzer to address surgical clearance.

## 2012-03-20 NOTE — Telephone Encounter (Signed)
New Problem:    Called in needing a surgical clearance for the patient for Aorta Bifem surgery on Monday.  Please call back.

## 2012-03-21 ENCOUNTER — Encounter (HOSPITAL_COMMUNITY): Payer: Self-pay

## 2012-03-21 ENCOUNTER — Encounter (HOSPITAL_COMMUNITY)
Admission: RE | Admit: 2012-03-21 | Discharge: 2012-03-21 | Disposition: A | Discharge status: Home / Self Care | Payer: Medicare Other | Source: Ambulatory Visit | Attending: Vascular Surgery | Admitting: Vascular Surgery

## 2012-03-21 ENCOUNTER — Ambulatory Visit (HOSPITAL_COMMUNITY)
Admission: RE | Admit: 2012-03-21 | Discharge: 2012-03-21 | Disposition: A | Discharge status: Home / Self Care | Payer: Medicare Other | Source: Ambulatory Visit | Attending: Vascular Surgery | Admitting: Vascular Surgery

## 2012-03-21 DIAGNOSIS — Z01812 Encounter for preprocedural laboratory examination: Secondary | ICD-10-CM | POA: Insufficient documentation

## 2012-03-21 DIAGNOSIS — Z01818 Encounter for other preprocedural examination: Secondary | ICD-10-CM | POA: Insufficient documentation

## 2012-03-21 DIAGNOSIS — J8410 Pulmonary fibrosis, unspecified: Secondary | ICD-10-CM | POA: Insufficient documentation

## 2012-03-21 HISTORY — DX: Urinary calculus, unspecified: N20.9

## 2012-03-21 HISTORY — DX: Urinary tract infection, site not specified: N39.0

## 2012-03-21 HISTORY — DX: Anemia, unspecified: D64.9

## 2012-03-21 LAB — URINALYSIS, ROUTINE W REFLEX MICROSCOPIC
Leukocytes, UA: NEGATIVE
Nitrite: NEGATIVE
Specific Gravity, Urine: 1.011 (ref 1.005–1.030)
Urobilinogen, UA: 0.2 mg/dL (ref 0.0–1.0)
pH: 5.5 (ref 5.0–8.0)

## 2012-03-21 LAB — CBC
HCT: 33.4 % — ABNORMAL LOW (ref 36.0–46.0)
Hemoglobin: 10.6 g/dL — ABNORMAL LOW (ref 12.0–15.0)
RDW: 16.7 % — ABNORMAL HIGH (ref 11.5–15.5)
WBC: 6.1 10*3/uL (ref 4.0–10.5)

## 2012-03-21 LAB — COMPREHENSIVE METABOLIC PANEL
ALT: 9 U/L (ref 0–35)
Albumin: 3.5 g/dL (ref 3.5–5.2)
Alkaline Phosphatase: 71 U/L (ref 39–117)
BUN: 16 mg/dL (ref 6–23)
Chloride: 98 mEq/L (ref 96–112)
Glucose, Bld: 142 mg/dL — ABNORMAL HIGH (ref 70–99)
Potassium: 3.7 mEq/L (ref 3.5–5.1)
Sodium: 138 mEq/L (ref 135–145)
Total Bilirubin: 0.1 mg/dL — ABNORMAL LOW (ref 0.3–1.2)

## 2012-03-21 LAB — SURGICAL PCR SCREEN: MRSA, PCR: NEGATIVE

## 2012-03-21 LAB — BLOOD GAS, ARTERIAL
Bicarbonate: 25.2 mEq/L — ABNORMAL HIGH (ref 20.0–24.0)
TCO2: 26.4 mmol/L (ref 0–100)
pCO2 arterial: 38 mmHg (ref 35.0–45.0)
pH, Arterial: 7.438 (ref 7.350–7.450)

## 2012-03-21 NOTE — Pre-Procedure Instructions (Addendum)
[address] [name]  03/21/2012   Your procedure is scheduled on:  03/26/12  Report to [facility] at530 AM.  Call this number if you have problems the morning of surgery: [phone]   Remember:   Do not eat food or drink:After Midnight.  .  Take these medicines the morning of surgery with A SIP OF WATER: xanax, dexilant, imdur, metoprolol, STOP plavix, omega3   Do not wear jewelry, make-up or nail polish.  Do not wear lotions, powders, or perfumes.   Do not shave 48 hours prior to surgery. Men may shave face and neck.  Do not bring valuables to the hospital.  Contacts, dentures or bridgework may not be worn into surgery.  Leave suitcase in the car. After surgery it may be brought to your room.  For patients admitted to the hospital, checkout time is 11:00 AM the day of discharge.   Patients discharged the day of surgery will not be allowed to drive home.  Name and phone number of your driver: alan [phone]  Special Instructions: CHG Shower Use Special Wash: 1/2 bottle night before surgery and 1/2 bottle morning of surgery.   Please read over the following fact sheets that you were given: Pain Booklet, Coughing and Deep Breathing, Blood Transfusion Information, MRSA Information and Surgical Site Infection Prevention

## 2012-03-21 NOTE — Progress Notes (Signed)
Note from dr cooper 8/13, ekg/8/13, coronary stent 2010, echo 03

## 2012-03-22 ENCOUNTER — Encounter (HOSPITAL_COMMUNITY): Payer: Self-pay | Admitting: Vascular Surgery

## 2012-03-22 NOTE — Consult Note (Signed)
Anesthesia Chart Review:  Patient 66 year old female scheduled for AFBG on 03/26/12 by Dr. Arbie Cookey.  History includes former smoker, CAD/NSTEMI s/p DES LCx '10, HTN, HLD, GERD, PAD, anemia, Raynaud's disease, DM2, post-operative N/V.  She is s/p staged bilateral FPBG (last on 08/22/11) and multiple percutaneous intervention in her iliac arteries.  PCP is Dr. Evlyn Kanner.  Her Cardiologist is Dr. Excell Seltzer.  He is aware of her upcoming surgery.  According to his most recent note, "Chart reviewed and patient is scheduled for aortobifemoral bypass surgery. She was just recently seen in the office and was stable from a cardiac perspective. She has had no anginal symptoms since her PCI procedure a few years ago. Her 12-lead EKG [from 02/22/12] is normal. There is no indication for preoperative stress testing. She can proceed with surgery with continuation of her medical program. She is greater than 12 months out from PCI so is acceptable to hold Plavix prior to surgery. I would be happy to see her if any problems arise postoperatively."  Cardiac cath on 02/26/09 showed (See Notes tab): 1. Preserved overall LV function with mild inferior wall motion  abnormality.  2. Critical stenosis of the mid circumflex with successful stenting percutaneously using Xience V drug-eluting platform with appropriate post dilatation.  3. Scattered diabetic coronary artery disease as noted above.   The last echo I see is from 2003.  CXR on 03/21/12 showed no active cardiopulmonary disease. Old granulomatous disease.  Labs noted.  If not new CV symptoms then anticipate she can proceed as planned.  Shonna Chock, PA-C

## 2012-03-25 MED ORDER — DEXTROSE 5 % IV SOLN
1.5000 g | INTRAVENOUS | Status: AC
  Administered 2012-03-26: 1.5 g via INTRAVENOUS
  Administered 2012-03-26: .75 g via INTRAVENOUS
  Filled 2012-03-25: qty 1.5

## 2012-03-26 ENCOUNTER — Encounter (HOSPITAL_COMMUNITY)
Admission: RE | Disposition: A | Discharge status: Home / Self Care | Payer: Self-pay | Source: Ambulatory Visit | Attending: Vascular Surgery

## 2012-03-26 ENCOUNTER — Encounter (HOSPITAL_COMMUNITY): Payer: Self-pay | Admitting: Vascular Surgery

## 2012-03-26 ENCOUNTER — Inpatient Hospital Stay (HOSPITAL_COMMUNITY)
Admission: RE | Admit: 2012-03-26 | Discharge: 2012-04-09 | DRG: 237 | Disposition: A | Discharge status: Home / Self Care | Payer: Medicare Other | Source: Ambulatory Visit | Attending: Vascular Surgery | Admitting: Vascular Surgery

## 2012-03-26 ENCOUNTER — Encounter (HOSPITAL_COMMUNITY): Payer: Self-pay | Admitting: *Deleted

## 2012-03-26 ENCOUNTER — Inpatient Hospital Stay (HOSPITAL_COMMUNITY): Payer: Medicare Other

## 2012-03-26 ENCOUNTER — Ambulatory Visit (HOSPITAL_COMMUNITY): Payer: Medicare Other | Admitting: Vascular Surgery

## 2012-03-26 DIAGNOSIS — K219 Gastro-esophageal reflux disease without esophagitis: Secondary | ICD-10-CM | POA: Diagnosis present

## 2012-03-26 DIAGNOSIS — J95821 Acute postprocedural respiratory failure: Secondary | ICD-10-CM

## 2012-03-26 DIAGNOSIS — L0291 Cutaneous abscess, unspecified: Secondary | ICD-10-CM

## 2012-03-26 DIAGNOSIS — K429 Umbilical hernia without obstruction or gangrene: Secondary | ICD-10-CM

## 2012-03-26 DIAGNOSIS — I70219 Atherosclerosis of native arteries of extremities with intermittent claudication, unspecified extremity: Secondary | ICD-10-CM

## 2012-03-26 DIAGNOSIS — I739 Peripheral vascular disease, unspecified: Secondary | ICD-10-CM

## 2012-03-26 DIAGNOSIS — L0390 Cellulitis, unspecified: Secondary | ICD-10-CM

## 2012-03-26 DIAGNOSIS — I5043 Acute on chronic combined systolic (congestive) and diastolic (congestive) heart failure: Secondary | ICD-10-CM | POA: Diagnosis not present

## 2012-03-26 DIAGNOSIS — E876 Hypokalemia: Secondary | ICD-10-CM | POA: Diagnosis not present

## 2012-03-26 DIAGNOSIS — I70229 Atherosclerosis of native arteries of extremities with rest pain, unspecified extremity: Principal | ICD-10-CM | POA: Diagnosis present

## 2012-03-26 DIAGNOSIS — I214 Non-ST elevation (NSTEMI) myocardial infarction: Secondary | ICD-10-CM

## 2012-03-26 DIAGNOSIS — E119 Type 2 diabetes mellitus without complications: Secondary | ICD-10-CM

## 2012-03-26 DIAGNOSIS — I498 Other specified cardiac arrhythmias: Secondary | ICD-10-CM | POA: Diagnosis present

## 2012-03-26 DIAGNOSIS — L98499 Non-pressure chronic ulcer of skin of other sites with unspecified severity: Secondary | ICD-10-CM

## 2012-03-26 DIAGNOSIS — J9589 Other postprocedural complications and disorders of respiratory system, not elsewhere classified: Secondary | ICD-10-CM

## 2012-03-26 DIAGNOSIS — I2510 Atherosclerotic heart disease of native coronary artery without angina pectoris: Secondary | ICD-10-CM

## 2012-03-26 DIAGNOSIS — I509 Heart failure, unspecified: Secondary | ICD-10-CM | POA: Diagnosis present

## 2012-03-26 DIAGNOSIS — D649 Anemia, unspecified: Secondary | ICD-10-CM | POA: Diagnosis present

## 2012-03-26 DIAGNOSIS — I5041 Acute combined systolic (congestive) and diastolic (congestive) heart failure: Secondary | ICD-10-CM

## 2012-03-26 DIAGNOSIS — B370 Candidal stomatitis: Secondary | ICD-10-CM | POA: Diagnosis not present

## 2012-03-26 DIAGNOSIS — I7409 Other arterial embolism and thrombosis of abdominal aorta: Secondary | ICD-10-CM | POA: Diagnosis present

## 2012-03-26 DIAGNOSIS — J9600 Acute respiratory failure, unspecified whether with hypoxia or hypercapnia: Secondary | ICD-10-CM | POA: Diagnosis not present

## 2012-03-26 DIAGNOSIS — J810 Acute pulmonary edema: Secondary | ICD-10-CM

## 2012-03-26 DIAGNOSIS — J9601 Acute respiratory failure with hypoxia: Secondary | ICD-10-CM

## 2012-03-26 DIAGNOSIS — T82898A Other specified complication of vascular prosthetic devices, implants and grafts, initial encounter: Secondary | ICD-10-CM

## 2012-03-26 DIAGNOSIS — I10 Essential (primary) hypertension: Secondary | ICD-10-CM

## 2012-03-26 HISTORY — PX: AORTA - BILATERAL FEMORAL ARTERY BYPASS GRAFT: SHX1175

## 2012-03-26 HISTORY — PX: UMBILICAL HERNIA REPAIR: SHX196

## 2012-03-26 HISTORY — PX: FEMORAL ARTERY EXPLORATION: SHX5160

## 2012-03-26 LAB — POCT I-STAT 7, (LYTES, BLD GAS, ICA,H+H)
Bicarbonate: 25.5 mEq/L — ABNORMAL HIGH (ref 20.0–24.0)
Bicarbonate: 26.3 mEq/L — ABNORMAL HIGH (ref 20.0–24.0)
O2 Saturation: 100 %
O2 Saturation: 100 %
Patient temperature: 36.4
Patient temperature: 37.3
TCO2: 27 mmol/L (ref 0–100)
TCO2: 28 mmol/L (ref 0–100)
pCO2 arterial: 42.6 mmHg (ref 35.0–45.0)
pCO2 arterial: 49.4 mmHg — ABNORMAL HIGH (ref 35.0–45.0)
pH, Arterial: 7.382 (ref 7.350–7.450)
pH, Arterial: 7.336 — ABNORMAL LOW (ref 7.350–7.450)
pO2, Arterial: 200 mmHg — ABNORMAL HIGH (ref 80.0–100.0)

## 2012-03-26 LAB — CBC
HCT: 29.1 % — ABNORMAL LOW (ref 36.0–46.0)
MCH: 28.4 pg (ref 26.0–34.0)
MCHC: 34 g/dL (ref 30.0–36.0)
MCV: 83.4 fL (ref 78.0–100.0)
Platelets: 172 10*3/uL (ref 150–400)
RDW: 16.1 % — ABNORMAL HIGH (ref 11.5–15.5)

## 2012-03-26 LAB — GLUCOSE, CAPILLARY
Glucose-Capillary: 149 mg/dL — ABNORMAL HIGH (ref 70–99)
Glucose-Capillary: 161 mg/dL — ABNORMAL HIGH (ref 70–99)

## 2012-03-26 LAB — POCT I-STAT 4, (NA,K, GLUC, HGB,HCT)
Glucose, Bld: 166 mg/dL — ABNORMAL HIGH (ref 70–99)
HCT: 28 % — ABNORMAL LOW (ref 36.0–46.0)
Potassium: 4.1 mEq/L (ref 3.5–5.1)
Sodium: 137 mEq/L (ref 135–145)

## 2012-03-26 LAB — BASIC METABOLIC PANEL
BUN: 12 mg/dL (ref 6–23)
Calcium: 7.6 mg/dL — ABNORMAL LOW (ref 8.4–10.5)
Creatinine, Ser: 0.66 mg/dL (ref 0.50–1.10)
GFR calc Af Amer: >90 mL/min (ref >90)
GFR calc non Af Amer: 89 mL/min — ABNORMAL LOW (ref >90)

## 2012-03-26 LAB — POCT I-STAT 3, ART BLOOD GAS (G3+)
Bicarbonate: 24.4 mEq/L — ABNORMAL HIGH (ref 20.0–24.0)
Patient temperature: 37.6
TCO2: 26 mmol/L (ref 0–100)
pH, Arterial: 7.335 — ABNORMAL LOW (ref 7.350–7.450)

## 2012-03-26 LAB — MAGNESIUM: Magnesium: 1.2 mg/dL — ABNORMAL LOW (ref 1.5–2.5)

## 2012-03-26 SURGERY — CREATION, BYPASS, ARTERIAL, AORTA TO FEMORAL, BILATERAL, USING GRAFT
Anesthesia: General | Site: Leg Upper | Laterality: Right | Wound class: Clean

## 2012-03-26 MED ORDER — ROCURONIUM BROMIDE 100 MG/10ML IV SOLN
INTRAVENOUS | Status: DC | PRN
  Administered 2012-03-26: 50 mg via INTRAVENOUS

## 2012-03-26 MED ORDER — PHENYLEPHRINE HCL 10 MG/ML IJ SOLN
10.0000 mg | INTRAVENOUS | Status: DC | PRN
  Administered 2012-03-26: 25 ug/min via INTRAVENOUS

## 2012-03-26 MED ORDER — SODIUM CHLORIDE 0.9 % IJ SOLN: 9.0000 mL | INTRAMUSCULAR | Status: DC | PRN

## 2012-03-26 MED ORDER — POTASSIUM CHLORIDE CRYS ER 20 MEQ PO TBCR: 20.0000 meq | EXTENDED_RELEASE_TABLET | Freq: Once | ORAL | Status: AC | PRN

## 2012-03-26 MED ORDER — PROTAMINE SULFATE 10 MG/ML IV SOLN
INTRAVENOUS | Status: DC | PRN
  Administered 2012-03-26: 50 mg via INTRAVENOUS

## 2012-03-26 MED ORDER — INSULIN ASPART 100 UNIT/ML ~~LOC~~ SOLN
0.0000 [IU] | Freq: Three times a day (TID) | SUBCUTANEOUS | Status: DC
  Administered 2012-03-26: 3 [IU] via SUBCUTANEOUS

## 2012-03-26 MED ORDER — MORPHINE SULFATE 10 MG/ML IJ SOLN
INTRAMUSCULAR | Status: DC | PRN
  Administered 2012-03-26 (×2): 5 mg via INTRAVENOUS

## 2012-03-26 MED ORDER — NEOSTIGMINE METHYLSULFATE 1 MG/ML IJ SOLN
INTRAMUSCULAR | Status: DC | PRN
  Administered 2012-03-26: 4 mg via INTRAVENOUS

## 2012-03-26 MED ORDER — MANNITOL 25 % IV SOLN
INTRAVENOUS | Status: DC | PRN
  Administered 2012-03-26: 25 g via INTRAVENOUS

## 2012-03-26 MED ORDER — POTASSIUM CHLORIDE IN NACL 20-0.9 MEQ/L-% IV SOLN
INTRAVENOUS | Status: DC
  Administered 2012-03-26 – 2012-03-28 (×3): via INTRAVENOUS
  Filled 2012-03-26 (×4): qty 1000

## 2012-03-26 MED ORDER — INSULIN ASPART 100 UNIT/ML ~~LOC~~ SOLN
0.0000 [IU] | SUBCUTANEOUS | Status: DC
  Administered 2012-03-26: 2 [IU] via SUBCUTANEOUS
  Administered 2012-03-27 (×3): 1 [IU] via SUBCUTANEOUS
  Administered 2012-03-27: 2 [IU] via SUBCUTANEOUS
  Administered 2012-03-28: 1 [IU] via SUBCUTANEOUS
  Administered 2012-03-28: 2 [IU] via SUBCUTANEOUS
  Administered 2012-03-28 – 2012-03-31 (×8): 1 [IU] via SUBCUTANEOUS
  Administered 2012-03-31 (×3): 2 [IU] via SUBCUTANEOUS
  Administered 2012-03-31: 1 [IU] via SUBCUTANEOUS
  Administered 2012-04-01: 2 [IU] via SUBCUTANEOUS
  Administered 2012-04-01 (×2): 1 [IU] via SUBCUTANEOUS
  Administered 2012-04-01: 3 [IU] via SUBCUTANEOUS
  Administered 2012-04-01: 1 [IU] via SUBCUTANEOUS
  Administered 2012-04-02 (×2): 2 [IU] via SUBCUTANEOUS
  Administered 2012-04-02 (×2): 1 [IU] via SUBCUTANEOUS
  Administered 2012-04-03 (×2): 3 [IU] via SUBCUTANEOUS
  Administered 2012-04-03 (×2): 2 [IU] via SUBCUTANEOUS
  Administered 2012-04-03 (×2): 3 [IU] via SUBCUTANEOUS
  Administered 2012-04-04: 2 [IU] via SUBCUTANEOUS
  Administered 2012-04-04: 1 [IU] via SUBCUTANEOUS
  Administered 2012-04-04 (×3): 2 [IU] via SUBCUTANEOUS
  Administered 2012-04-05: 1 [IU] via SUBCUTANEOUS
  Administered 2012-04-06 (×2): 3 [IU] via SUBCUTANEOUS
  Administered 2012-04-06 (×3): 2 [IU] via SUBCUTANEOUS

## 2012-03-26 MED ORDER — PROMETHAZINE HCL 25 MG/ML IJ SOLN: 6.2500 mg | INTRAMUSCULAR | Status: DC | PRN

## 2012-03-26 MED ORDER — HEPARIN SODIUM (PORCINE) 1000 UNIT/ML IJ SOLN
INTRAMUSCULAR | Status: AC
  Filled 2012-03-26: qty 1

## 2012-03-26 MED ORDER — PHENOL 1.4 % MT LIQD
1.0000 | OROMUCOSAL | Status: DC | PRN
  Filled 2012-03-26: qty 177

## 2012-03-26 MED ORDER — GUAIFENESIN-DM 100-10 MG/5ML PO SYRP: 15.0000 mL | ORAL_SOLUTION | ORAL | Status: DC | PRN

## 2012-03-26 MED ORDER — 0.9 % SODIUM CHLORIDE (POUR BTL) OPTIME
TOPICAL | Status: DC | PRN
  Administered 2012-03-26: 3000 mL

## 2012-03-26 MED ORDER — HYDRALAZINE HCL 20 MG/ML IJ SOLN
10.0000 mg | INTRAMUSCULAR | Status: DC | PRN
  Filled 2012-03-26: qty 0.5

## 2012-03-26 MED ORDER — CEFUROXIME SODIUM 1.5 G IJ SOLR
1.5000 g | Freq: Two times a day (BID) | INTRAMUSCULAR | Status: AC
  Administered 2012-03-26 – 2012-03-27 (×2): 1.5 g via INTRAVENOUS
  Filled 2012-03-26 (×2): qty 1.5

## 2012-03-26 MED ORDER — DOPAMINE-DEXTROSE 3.2-5 MG/ML-% IV SOLN: 3.0000 ug/kg/min | INTRAVENOUS | Status: DC

## 2012-03-26 MED ORDER — EPHEDRINE SULFATE 50 MG/ML IJ SOLN
INTRAMUSCULAR | Status: DC | PRN
Start: 2012-03-26 — End: 2012-03-26
  Administered 2012-03-26: 5 mg via INTRAVENOUS

## 2012-03-26 MED ORDER — DIPHENHYDRAMINE HCL 50 MG/ML IJ SOLN: 12.5000 mg | Freq: Four times a day (QID) | INTRAMUSCULAR | Status: DC | PRN

## 2012-03-26 MED ORDER — NALOXONE HCL 0.4 MG/ML IJ SOLN: 0.4000 mg | INTRAMUSCULAR | Status: DC | PRN

## 2012-03-26 MED ORDER — SCOPOLAMINE 1 MG/3DAYS TD PT72
MEDICATED_PATCH | TRANSDERMAL | Status: DC | PRN
  Administered 2012-03-26: 1 via TRANSDERMAL

## 2012-03-26 MED ORDER — FENTANYL CITRATE 0.05 MG/ML IJ SOLN
50.0000 ug | INTRAMUSCULAR | Status: DC | PRN
  Administered 2012-03-27 – 2012-03-29 (×2): 100 ug via INTRAVENOUS
  Filled 2012-03-26 (×3): qty 2

## 2012-03-26 MED ORDER — LABETALOL HCL 5 MG/ML IV SOLN
10.0000 mg | INTRAVENOUS | Status: DC | PRN
  Filled 2012-03-26: qty 4

## 2012-03-26 MED ORDER — HYDROMORPHONE HCL PF 1 MG/ML IJ SOLN: 0.2500 mg | INTRAMUSCULAR | Status: DC | PRN

## 2012-03-26 MED ORDER — GLYCOPYRROLATE 0.2 MG/ML IJ SOLN
INTRAMUSCULAR | Status: DC | PRN
  Administered 2012-03-26: .6 mg via INTRAVENOUS

## 2012-03-26 MED ORDER — SODIUM CHLORIDE 0.9 % IV SOLN: INTRAVENOUS | Status: DC

## 2012-03-26 MED ORDER — DEXTROSE 5 % IV SOLN
750.0000 mg | Freq: Once | INTRAVENOUS | Status: DC
  Filled 2012-03-26: qty 750

## 2012-03-26 MED ORDER — MAGNESIUM SULFATE 40 MG/ML IJ SOLN
2.0000 g | Freq: Once | INTRAMUSCULAR | Status: AC | PRN
  Filled 2012-03-26: qty 100

## 2012-03-26 MED ORDER — ACETAMINOPHEN 650 MG RE SUPP: 325.0000 mg | RECTAL | Status: DC | PRN

## 2012-03-26 MED ORDER — PROCHLORPERAZINE EDISYLATE 5 MG/ML IJ SOLN
10.0000 mg | Freq: Four times a day (QID) | INTRAMUSCULAR | Status: DC | PRN
  Filled 2012-03-26 (×2): qty 2

## 2012-03-26 MED ORDER — METOPROLOL TARTRATE 1 MG/ML IV SOLN
2.0000 mg | INTRAVENOUS | Status: DC | PRN
  Filled 2012-03-26 (×5): qty 5

## 2012-03-26 MED ORDER — MIDAZOLAM HCL 2 MG/2ML IJ SOLN: 0.5000 mg | Freq: Once | INTRAMUSCULAR | Status: DC | PRN

## 2012-03-26 MED ORDER — PROPOFOL 10 MG/ML IV EMUL
INTRAVENOUS | Status: DC | PRN
  Administered 2012-03-26: 30 mg via INTRAVENOUS
  Administered 2012-03-26: 90 mg via INTRAVENOUS

## 2012-03-26 MED ORDER — VECURONIUM BROMIDE 10 MG IV SOLR
INTRAVENOUS | Status: DC | PRN
  Administered 2012-03-26 (×5): 2 mg via INTRAVENOUS

## 2012-03-26 MED ORDER — MIDAZOLAM HCL 5 MG/5ML IJ SOLN
INTRAMUSCULAR | Status: DC | PRN
  Administered 2012-03-26 (×2): 2 mg via INTRAVENOUS

## 2012-03-26 MED ORDER — ONDANSETRON HCL 4 MG/2ML IJ SOLN
INTRAMUSCULAR | Status: DC | PRN
  Administered 2012-03-26: 4 mg via INTRAVENOUS

## 2012-03-26 MED ORDER — PROPOFOL 10 MG/ML IV EMUL
5.0000 ug/kg/min | INTRAVENOUS | Status: DC
  Administered 2012-03-26: 10 ug/kg/min via INTRAVENOUS
  Administered 2012-03-27: 20 ug/kg/min via INTRAVENOUS
  Filled 2012-03-26 (×2): qty 100

## 2012-03-26 MED ORDER — DIPHENHYDRAMINE HCL 12.5 MG/5ML PO ELIX
12.5000 mg | ORAL_SOLUTION | Freq: Four times a day (QID) | ORAL | Status: DC | PRN
  Filled 2012-03-26: qty 5

## 2012-03-26 MED ORDER — FENTANYL CITRATE 0.05 MG/ML IJ SOLN
INTRAMUSCULAR | Status: DC | PRN
  Administered 2012-03-26: 100 ug via INTRAVENOUS
  Administered 2012-03-26 (×3): 50 ug via INTRAVENOUS
  Administered 2012-03-26 (×2): 100 ug via INTRAVENOUS
  Administered 2012-03-26 (×3): 50 ug via INTRAVENOUS
  Administered 2012-03-26: 400 ug via INTRAVENOUS

## 2012-03-26 MED ORDER — CHLORHEXIDINE GLUCONATE 0.12 % MT SOLN
15.0000 mL | Freq: Two times a day (BID) | OROMUCOSAL | Status: DC
  Administered 2012-03-26 – 2012-03-27 (×3): 15 mL via OROMUCOSAL
  Filled 2012-03-26 (×5): qty 15

## 2012-03-26 MED ORDER — ALBUMIN HUMAN 5 % IV SOLN
INTRAVENOUS | Status: DC | PRN
  Administered 2012-03-26 (×2): via INTRAVENOUS

## 2012-03-26 MED ORDER — SODIUM CHLORIDE 0.9 % IV SOLN
INTRAVENOUS | Status: DC | PRN
  Administered 2012-03-26: 12:00:00 via INTRAVENOUS

## 2012-03-26 MED ORDER — MAGNESIUM SULFATE 50 % IJ SOLN
4.0000 g | Freq: Once | INTRAVENOUS | Status: AC
  Administered 2012-03-26: 4 g via INTRAVENOUS
  Filled 2012-03-26: qty 8

## 2012-03-26 MED ORDER — DOCUSATE SODIUM 100 MG PO CAPS: 100.0000 mg | ORAL_CAPSULE | Freq: Every day | ORAL | Status: DC

## 2012-03-26 MED ORDER — SODIUM CHLORIDE 0.9 % IV SOLN: 500.0000 mL | Freq: Once | INTRAVENOUS | Status: AC | PRN

## 2012-03-26 MED ORDER — MEPERIDINE HCL 25 MG/ML IJ SOLN: 6.2500 mg | INTRAMUSCULAR | Status: DC | PRN

## 2012-03-26 MED ORDER — HEPARIN SODIUM (PORCINE) 1000 UNIT/ML IJ SOLN
INTRAMUSCULAR | Status: DC | PRN
  Administered 2012-03-26: 8000 [IU] via INTRAVENOUS
  Administered 2012-03-26: 5000 [IU] via INTRAVENOUS

## 2012-03-26 MED ORDER — MORPHINE SULFATE (PF) 1 MG/ML IV SOLN: INTRAVENOUS | Status: DC

## 2012-03-26 MED ORDER — LACTATED RINGERS IV SOLN
INTRAVENOUS | Status: DC | PRN
  Administered 2012-03-26 (×6): via INTRAVENOUS

## 2012-03-26 MED ORDER — SCOPOLAMINE 1 MG/3DAYS TD PT72
MEDICATED_PATCH | TRANSDERMAL | Status: AC
  Filled 2012-03-26: qty 1

## 2012-03-26 MED ORDER — METOPROLOL TARTRATE 1 MG/ML IV SOLN
2.5000 mg | Freq: Four times a day (QID) | INTRAVENOUS | Status: DC
  Administered 2012-03-27: 2.5 mg via INTRAVENOUS
  Filled 2012-03-26 (×8): qty 5

## 2012-03-26 MED ORDER — PANTOPRAZOLE SODIUM 40 MG IV SOLR: 40.0000 mg | INTRAVENOUS | Status: DC

## 2012-03-26 MED ORDER — SODIUM CHLORIDE 0.9 % IR SOLN
Status: DC | PRN
  Administered 2012-03-26: 09:00:00

## 2012-03-26 MED ORDER — ALUM & MAG HYDROXIDE-SIMETH 200-200-20 MG/5ML PO SUSP: 15.0000 mL | ORAL | Status: DC | PRN

## 2012-03-26 MED ORDER — PANTOPRAZOLE SODIUM 40 MG PO TBEC: 40.0000 mg | DELAYED_RELEASE_TABLET | Freq: Every day | ORAL | Status: DC

## 2012-03-26 SURGICAL SUPPLY — 73 items
APL SKNCLS STERI-STRIP NONHPOA (GAUZE/BANDAGES/DRESSINGS) ×8
BAG ISL DRAPE 18X18 STRL (DRAPES) ×2
BAG ISOLATION DRAPE 18X18 (DRAPES) IMPLANT
BENZOIN TINCTURE PRP APPL 2/3 (GAUZE/BANDAGES/DRESSINGS) ×4 IMPLANT
CANISTER SUCTION 2500CC (MISCELLANEOUS) ×3 IMPLANT
CANNULA VESSEL W/WING WO/VALVE (CANNULA) ×3 IMPLANT
CATH EMB 3FR 40CM (CATHETERS) ×1 IMPLANT
CATH EMB 4FR 80CM (CATHETERS) ×2 IMPLANT
CLIP LIGATING EXTRA MED SLVR (CLIP) ×3 IMPLANT
CLIP LIGATING EXTRA SM BLUE (MISCELLANEOUS) ×3 IMPLANT
CLOTH BEACON ORANGE TIMEOUT ST (SAFETY) ×3 IMPLANT
CLSR STERI-STRIP ANTIMIC 1/2X4 (GAUZE/BANDAGES/DRESSINGS) ×4 IMPLANT
COVER PROBE W GEL 5X96 (DRAPES) ×1 IMPLANT
COVER SURGICAL LIGHT HANDLE (MISCELLANEOUS) ×3 IMPLANT
COVER TABLE BACK 60X90 (DRAPES) ×3 IMPLANT
DRAPE BILATERAL SPLIT (DRAPES) IMPLANT
DRAPE CV SPLIT W-CLR ANES SCRN (DRAPES) IMPLANT
DRAPE INCISE IOBAN 85X60 (DRAPES) ×1 IMPLANT
DRAPE ISOLATION BAG 18X18 (DRAPES) ×1
DRAPE WARM FLUID 44X44 (DRAPE) ×3 IMPLANT
DRSG COVADERM 4X6 (GAUZE/BANDAGES/DRESSINGS) ×2 IMPLANT
DRSG COVADERM 4X8 (GAUZE/BANDAGES/DRESSINGS) ×2 IMPLANT
ELECT BLADE 4.0 EZ CLEAN MEGAD (MISCELLANEOUS)
ELECT REM PT RETURN 9FT ADLT (ELECTROSURGICAL) ×3
ELECTRODE BLDE 4.0 EZ CLN MEGD (MISCELLANEOUS) IMPLANT
ELECTRODE REM PT RTRN 9FT ADLT (ELECTROSURGICAL) ×2 IMPLANT
GLOVE BIO SURGEON STRL SZ 6.5 (GLOVE) ×4 IMPLANT
GLOVE BIOGEL PI IND STRL 6.5 (GLOVE) IMPLANT
GLOVE BIOGEL PI IND STRL 7.0 (GLOVE) IMPLANT
GLOVE BIOGEL PI INDICATOR 6.5 (GLOVE) ×3
GLOVE BIOGEL PI INDICATOR 7.0 (GLOVE) ×5
GLOVE ECLIPSE 7.0 STRL STRAW (GLOVE) ×1 IMPLANT
GLOVE SS BIOGEL STRL SZ 7.5 (GLOVE) ×2 IMPLANT
GLOVE SUPERSENSE BIOGEL SZ 7.5 (GLOVE) ×2
GLOVE SURG SS PI 6.0 STRL IVOR (GLOVE) ×3 IMPLANT
GOWN STRL NON-REIN LRG LVL3 (GOWN DISPOSABLE) ×16 IMPLANT
GRAFT GORETEX T/W 6X10 (Vascular Products) ×1 IMPLANT
GRAFT HEMASHIELD 14X8MM (Vascular Products) ×1 IMPLANT
INSERT FOGARTY 61MM (MISCELLANEOUS) IMPLANT
INSERT FOGARTY SM (MISCELLANEOUS) IMPLANT
KIT BASIN OR (CUSTOM PROCEDURE TRAY) ×3 IMPLANT
KIT ROOM TURNOVER OR (KITS) ×3 IMPLANT
NS IRRIG 1000ML POUR BTL (IV SOLUTION) ×8 IMPLANT
PACK AORTA (CUSTOM PROCEDURE TRAY) ×3 IMPLANT
PACK UNIVERSAL I (CUSTOM PROCEDURE TRAY) ×1 IMPLANT
PAD ARMBOARD 7.5X6 YLW CONV (MISCELLANEOUS) ×6 IMPLANT
PENCIL BUTTON HOLSTER BLD 10FT (ELECTRODE) ×1 IMPLANT
PUNCH AORTIC ROT 5.0MM RCL 50 (MISCELLANEOUS) ×1 IMPLANT
SPECIMEN JAR MEDIUM (MISCELLANEOUS) ×2 IMPLANT
SPONGE LAP 18X18 X RAY DECT (DISPOSABLE) ×1 IMPLANT
STAPLER VISISTAT 35W (STAPLE) ×4 IMPLANT
SUT ETHIBOND 5 LR DA (SUTURE) IMPLANT
SUT PDS AB 1 TP1 54 (SUTURE) ×6 IMPLANT
SUT PROLENE 3 0 SH1 36 (SUTURE) ×4 IMPLANT
SUT PROLENE 5 0 C 1 24 (SUTURE) ×8 IMPLANT
SUT PROLENE 5 0 C 1 36 (SUTURE) IMPLANT
SUT PROLENE 6 0 CC (SUTURE) ×6 IMPLANT
SUT SILK 2 0 SH CR/8 (SUTURE) ×3 IMPLANT
SUT SILK 2 0 TIES 17X18 (SUTURE)
SUT SILK 2-0 18XBRD TIE BLK (SUTURE) ×2 IMPLANT
SUT SILK 3 0 TIES 17X18 (SUTURE)
SUT SILK 3-0 18XBRD TIE BLK (SUTURE) ×2 IMPLANT
SUT VIC AB 2-0 CT1 36 (SUTURE) ×9 IMPLANT
SUT VIC AB 3-0 SH 27 (SUTURE) ×12
SUT VIC AB 3-0 SH 27X BRD (SUTURE) ×4 IMPLANT
SUT VICRYL 4-0 PS2 18IN ABS (SUTURE) ×2 IMPLANT
SYR 3ML LL SCALE MARK (SYRINGE) ×1 IMPLANT
TOWEL BLUE STERILE X RAY DET (MISCELLANEOUS) ×6 IMPLANT
TOWEL OR 17X24 6PK STRL BLUE (TOWEL DISPOSABLE) ×7 IMPLANT
TOWEL OR 17X26 10 PK STRL BLUE (TOWEL DISPOSABLE) ×7 IMPLANT
TRAY FOLEY CATH 14FRSI W/METER (CATHETERS) ×3 IMPLANT
TUBE CONNECTING 12X1/4 (SUCTIONS) ×1 IMPLANT
WATER STERILE IRR 1000ML POUR (IV SOLUTION) ×6 IMPLANT

## 2012-03-26 NOTE — Progress Notes (Signed)
Vent changes made per ABG, awaiting CCM orders.

## 2012-03-26 NOTE — OR Nursing (Signed)
Dr. Arbie Cookey asked [name] to update the family in the surgical waiting room @[phone] 

## 2012-03-26 NOTE — Transfer of Care (Signed)
Immediate Anesthesia Transfer of Care Note  Patient: [name]  Procedure(s) Performed: Procedure(s) (LRB) with comments: AORTA BIFEMORAL BYPASS GRAFT (N/A) - Aortic-bifemoral bypass using 14x65mm Hemashield graft .  HERNIA REPAIR UMBILICAL ADULT (N/A) - Removal of Umbilical hernia sac FEMORAL ARTERY EXPLORATION (Right) - with Revision of Popliteal-Peroneal bypass graft using 6mm x 10cm thin wall goretex graft  Patient Location: SICU  Anesthesia Type: General  Level of Consciousness: Patient remains intubated per anesthesia plan  Airway & Oxygen Therapy: Patient remains intubated per anesthesia plan and Patient placed on Ventilator (see vital sign flow sheet for setting)  Post-op Assessment: Report given to PACU RN and Post -op Vital signs reviewed and stable  Post vital signs: Reviewed and stable  Complications: No apparent anesthesia complications

## 2012-03-26 NOTE — Op Note (Signed)
OPERATIVE REPORT  DATE OF SURGERY: 03/26/2012  PATIENT: [name], 66 y.o. female MRN: [id number]  DOB: Jun 01, 1946  PRE-OPERATIVE DIAGNOSIS: Bilateral lower extremity arterial insufficiency  POST-OPERATIVE DIAGNOSIS:  Same  PROCEDURE: 1. Aortobifemoral bypass with 14 x 18 the shield graft with reimplantation of accessory left renal artery 2. Thrombectomy of right femoral to below knee popliteal Gore-Tex graft extension to the peroneal artery with 6 mm Gore-Tex 3. Umbilical hernia repair SURGEON:  Gretta Began, M.D.  PHYSICIAN ASSISTANT: Rhyne  ANESTHESIA:  Gen.  EBL: 1000 ml  Total I/O In: 8250 [I.V.:6500; Blood:1000; IV Piggyback:750] Out: 2100 [Urine:1100; Blood:1000]  BLOOD ADMINISTERED: Cell saver and one unit packed cells  DRAINS: None  SPECIMEN: Umbilical hernia sac  COUNTS CORRECT:  YES  PLAN OF CARE: SICU intubated   PATIENT DISPOSITION:  PACU - hemodynamically stable  PROCEDURE DETAILS: Patient is a 66 year old white female with an extensive past history of peripheral vascular occlusive disease. She has multiple bilateral infrainguinal and iliac angioplasties. She is also status post bilateral femoral-popliteal bypasses with multiple revisions of both of these. 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 She is extremely small external iliac arteries. She is having rest pain more so on the right and left. I discussed options. I do not feel that ongoing interventions in her external iliac artery going to be durable. I have recommended aortobifemoral bypass for improvement of her inflow issues. Her most recent arteriogram in July revealed patent femoral popliteal bypasses bilaterally. This is a Gore-Tex graft on the right and the vein graft on the left. She's had patch angioplasty revision of the  proximal anastomosis on the left. She has single-vessel peroneal runoff on the right.  Procedure in detail the patient was taken up replacing that is where the area of the abdomen and both groins were prepped and draped in usual sterile fashion. A right groin incision was made through prior scar. There was extensive scarring and this was carried down to isolate the prior hood of the prosthetic femoropopliteal bypass. There was minimal pulsation at this level. The external iliac artery was encircled at the level of the inguinal ligament. This was extremely small and completely calcified. The stent a large medial profundus branches and this was preserved. The lateral main profunda was densely scarred and this was not exposed. The Gore-Tex graft was encircled.  Attention was then turned to the left groin. An incision was made through prior scar and carried down to isolate the external iliac artery up under the inguinal ligament. Again there was dense scarring. The external iliac was extremely calcified as well and was encircled with a vessel loop. There was a markedly diminished pulse in this side as well. The profundus femoris artery was exposed for control. The vein femoropopliteal bypass was encircled with a vessel loop. The patient had had a prior Finesse Dacron patch angioplasty over this area and had an extremely calcified common femoral artery.   Next an incision was made from the level of the xiphoid to below the umbilicus. The incision was deepened down to the level of the linea alba. The midline fascia was opened electrocautery and was extended from the xiphoid to below the umbilicus. The patient had a large umbilical hernia. The hernia sac was dissected free and was sent for pathology. The fascia was freshened up this area for  eventual closure. The peritoneum was entered the stomach small bowel colon and liver were all normal. The patient great deal of stool in the colon. The Omni-Tract retractor was  used for exposure. The aorta was small and extremely calcified. The aorta was encircled below the level of renal arteries and was adequate first anastomosis. The patient had a large lower pole accessory renal artery on the left. This was encircled and controlled for eventual re\re anastomosis. The artery was circumferentially calcified at the bifurcation but was somewhat softer above this. The intermesenteric artery appeared to be patent , as on the iliac vessels down towards the groins taking care to pass behind the level of the ureter bilaterally. The tunnel was created from the level of the groin to the bifurcation. Umbilical tapes were placed in these 4 eventual graft passage. The patient was given 25 g of mannitol and 6000 units of intravenous heparin. After adequate circulation time the aorta was occluded below the level of the renal arteries with a Harken clamp. The distal aorta was occluded with a DeBakey clamp. The left lower pole accessory renal artery was occluded with a serrefine clamp. The aorta was transected below the level of clamp and then a segment of aorta was removed for closure. The distal aorta was endarterectomized and was oversewn with 3-0 Prolene suture. A DeBakey clamp was removed and the 14 x 8 Hemashield graft was brought onto the field cut to appropriate length and was using a felt strip for reinforcement was sewn into into the aorta below the level renal arteries with a running 3-0 Prolene suture. Anastomosis was tested and found to be adequate. The right and left limbs were brought to the respective groins due to prior created tunnel. The ellipse of graft was removed on the left lateral edge of the aortic graft and the lower pole accessory renal artery was reimplanted with a running 6-0 Prolene suture. Anastomosis was tested and found to be adequate. Next the right external iliac artery was occluded with a Hanley clamp and the distal common femoral artery was occluded to include the  hood of the prior existing right femoral-popliteal bypass. The hood of the old femoropopliteal was opened and the lumen was widely patent at this area. The right limb of the graft was brought onto this area and was cut to appropriate length and spatulated and sewn end-to-side onto the junction of the hood of the prior right femoral-popliteal bypass. After the usual flushing maneuvers anastomosis completed and was adequate. Next attention was turned to the left groin. The external iliac artery was occluded with a Hanley clamp the profundus femoris artery was occluded with DeBakey clamp and the vein graft was occluded with a vessel loop. The prior Dacron patch was opened and extended longitudinally. There was extensive calcification posteriorly but this was adequate for anastomosis. The left limb of the graft was spatulated and sewn end-to-side to the junction of this old patch with a running 5-0 Prolene suture. After complete ablation anastomosis and the usual flushing maneuvers anastomosis was completed. Good flows noted through the vein graft. The patient was given 50 mg of protamine to reverse heparin. Wound irrigated with saline. Hemostasis obtained electrocautery. The groin was occluded closed with several layers of 2-0 Vicryl the subcutaneous tissue the skin was closed through subcutaneous to right stitch. The abdominal contents were explored the small bowel was run its entirety and be without injury was placed back in the pelvis. The transverse colon omentum were placed over this.  Midline fascia was closed with #1 PDS suture beginning proximally and distally. This was including the closure of the prior umbilical hernia. The skin was closed with 30 septic Vicryl stitch. Sterile dressings were applied. The left fever then examined. The left foot was well-perfused with a biphasic posterior tibial signal and a palpable popliteal pulse. On the right there was popliteal signal but no pulse and very poor signal at  the level of the foot with a faintly audible peroneal signal. For this reason decision was made to reexplore the right groin with presumed graft occlusion.    of note the patient had had duplex 6 days earlier suggesting patency of both femoropopliteal bypass. This was [place] the patient had such severe ischemia. On every opening the groin the Gore-Tex graft was opened transversely and there was organized thrombus present. Sclerae this was not fresh clot. The junction to the femoral artery was thrombectomized and an organized arterialized plug was removed with good inflow. This was reoccluded. The patient had been given 5000 units with this additional heparin. A Fogarty catheter was passed down the Gore-Tex graft. Organized clot and intimal was removed the TA-30 would not pass to the distal anastomosis. For this reason the below knee popliteal anastomosis was exposed. The popliteal artery was extremely small and extensively calcified. The graft was opened after exposing the below knee popliteal artery. The graft was opened transversely near the distal arterial anastomosis. Again the third cath was attempted be passed through this anastomosis and this was not possible. The graft was opened onto the below-knee popliteal artery. There was no backbleeding and a 3 Fogarty would not pass through any lumen. The artery was exposed down onto the anterior tibial takeoff of the junction with the tibioperoneal trunk. Again was extremely calcified and did not have an adequate lumen for sewing. For this reason the artery was exposed further down onto the junction of the posterior tibial and peroneal arteries. The artery was opened longitudinally onto the tibioperoneal trunk and indeed there was no acceptable lumen patent and no backbleeding. As was extended down to the junction of the posterior tibial and peroneal. The posterior tibial was chronically occluded. There was some backbleeding from the very diseased small peroneal  artery this was a subtle runoff vessel. The distal peroneal was controlled with a red vessel loop Potts tie. We didn't damage to see if there was any small saphenous vein for jumped from the existing Gore-Tex graft. Small saphenous was visualized and was extremely small and not adequate for a bypass. For this reason a 6 mm thinwall Gore-Tex graft brought onto the field and was spatulated and sewn end to end to the peroneal artery with a running 6-0 Prolene suture. A one half dilator did pass through this into a very diseased peroneal artery. This was flushed with heparinized and reoccluded. Next the inner position graft was sewn end-to-end to the old Gore-Tex graft at the below-knee position with a running 6-0 Prolene suture. Clamps removed and the Doppler flow was noted in the peroneal artery below the anastomosis. The foot continued to have a poor flow. It was felt that no other option was present this was into a sole runoff vessel the peroneal artery with no other runoff vessels into the foot. The patient was not given protamine for reversal of the heparin. The groin and popliteal wounds were closed with 2-0 Vicryl in the subcutaneous tissue skin was closed with a 3 septic Vicryl stitch. The patient was intubated and remained  intubated and was transferred to the surgical intensive care unit critically  condition but hemodynamically stable.   Gretta Began, M.D. 03/26/2012 5:07 PM

## 2012-03-26 NOTE — Progress Notes (Signed)
MEDICATION RELATED CONSULT NOTE - INITIAL   Pharmacy consulted for renal antibiotic adjustment.  32GMW s/p aorta bifemoral bypass graft to receive Zinacef for post surgical prophylaxis. Wt 73kg, CrCl 69 ml/min, no reported allergies to PCN/Cephalosporins. Patient has Zinacef 1.5g IV q12h x 2 doses ordered - doses appropriate. Pharmacy will sign off. Please reconsult if additional assistance is needed. Thanks!!  Cleon Dew, PharmD [phone] 03/26/2012

## 2012-03-26 NOTE — Consult Note (Signed)
Name: [name] MRN: [id number] DOB: 1946-01-01    LOS: 0  Referring Provider:  Dr Brantley Fling Reason for Referral:  Post op resp failure  PULMONARY / CRITICAL CARE MEDICINE  HPI:  66 year old h/o multiple vascular bypass procedures, PVC underwent elective Aortobifemoral bypass with 14 x 18 the shield graft with reimplantation of accessory left renal artery and thrombectomy of right femoral to below knee popliteal Gore-Tex graft extension to the peroneal artery with 6 mm Gore-Tex.  The surgery time was prolonged as complicated. Still with some concerns hypoperfused rt foot. Presents with post op resp failure.   Past Medical History  Diagnosis Date  . Hypertension     Unspecified  . Hyperlipidemia     Mixed  . Tobacco abuse     Remote  . Loose, teeth     has loose bridge and two loose teeth holding it  . PONV (postoperative nausea and vomiting)   . Type II diabetes mellitus   . GERD (gastroesophageal reflux disease)   . Anxiety   . Peripheral vascular disease     unspecified, a. s/p L CEA b. s/p B fem-pop bypass  . Raynaud disease   . Stones in the urinary tract   . UTI (urinary tract infection)   . Anemia     hx  . Coronary artery disease     a. s/p NSTEMI 02/2009 - PCI LCX with Xience DES. Otherwise branch vessel and Dist. RCA dzs. NL EF.    Past Surgical History  Procedure Date  . Vesicovaginal fistula closure w/ tah   . Colonoscopy 11/2010  . Upper gastrointestinal endoscopy 11/2010  . Femoral artery - popliteal artery bypass graft     left  . Abdominal hysterectomy     complete  . Femoral-popliteal bypass graft 08/22/2011    Procedure: BYPASS GRAFT FEMORAL-POPLITEAL ARTERY;  Surgeon: Gretta Began, MD;  Location: [facility] OR;  Service: Vascular;  Laterality: Right;  Thrombectomy and Revision using 7mm x 10cm stretch goretex graft  . Intraoperative arteriogram 08/22/2011    Procedure: INTRA OPERATIVE ARTERIOGRAM;  Surgeon: Gretta Began, MD;  Location: [facility] OR;  Service: Vascular;   Laterality: Right;  to lower leg  . Multiple tooth extractions 08-29-2011    5 teeth extracted   . Pci 01/17/12    RLE  . Breast lumpectomy     right  . Tonsillectomy and adenoidectomy   . Coronary angioplasty with stent placement   . Carotid endarterectomy ~ 2005    left   Prior to Admission medications   Medication Sig Start Date End Date Taking? Authorizing Provider  ALPRAZolam (XANAX) 0.25 MG tablet Take 0.25 mg by mouth daily.    Yes Historical Provider, MD  aspirin 325 MG EC tablet Take 325 mg by mouth daily.   Yes Historical Provider, MD  aspirin 81 MG tablet Take 81 mg by mouth daily.    Yes Historical Provider, MD  calcium carbonate (TUMS) 500 MG chewable tablet Chew 1-4 tablets by mouth daily as needed. For acid reflux. Pt can take 1 to 4 tabs as needed for reflux   Yes Historical Provider, MD  Cholecalciferol 25000 UNITS CAPS Take 25,000 Units by mouth 3 (three) times a week. Monday, Wednesday, friday   Yes Historical Provider, MD  clopidogrel (PLAVIX) 75 MG tablet Take 1 tablet (75 mg total) by mouth daily. 02/20/12  Yes Dyann Kief, PA  dexlansoprazole (DEXILANT) 60 MG capsule Take 60 mg by mouth 2 (two) times daily.  Yes Historical Provider, MD  glyBURIDE (DIABETA) 2.5 MG tablet Take 2.5 mg by mouth every morning.    Yes Historical Provider, MD  hydrochlorothiazide 25 MG tablet Take 25 mg by mouth daily.    Yes Historical Provider, MD  iron polysaccharides (NIFEREX) 150 MG capsule Take 150 mg by mouth 2 (two) times daily.   Yes Historical Provider, MD  isosorbide mononitrate (IMDUR) 30 MG 24 hr tablet Take 15 mg by mouth 2 (two) times daily.   Yes Historical Provider, MD  metoprolol succinate (TOPROL-XL) 25 MG 24 hr tablet TAKE 1 TABLET EVERY DAY 01/31/12  Yes Tonny Bollman, MD  omega-3 acid ethyl esters (LOVAZA) 1 G capsule Take 2 g by mouth 2 (two) times daily.    Yes Historical Provider, MD  pioglitazone-metformin (ACTOPLUS MET) 15-500 MG per tablet Take 1 tablet by  mouth daily. Brand name only-does not work if components are given separately per patient   Yes Historical Provider, MD  Probiotic Product (ACIDOPHILUS PEARLS PO) Take 1 capsule by mouth daily.   Yes Historical Provider, MD  prochlorperazine (COMPAZINE) 10 MG tablet Take 10 mg by mouth every 6 (six) hours as needed. For nausea   Yes Historical Provider, MD  ramipril (ALTACE) 1.25 MG capsule Take 1.25 mg by mouth daily.   Yes Historical Provider, MD  sitaGLIPtan (JANUVIA) 100 MG tablet Take 100 mg by mouth at bedtime.    Yes Historical Provider, MD  meperidine (DEMEROL) 50 MG tablet Take 25-50 mg by mouth every 4 (four) hours as needed. For pain    Historical Provider, MD  nitroGLYCERIN (NITROSTAT) 0.4 MG SL tablet Place 0.4 mg under the tongue every 5 (five) minutes as needed. For chest pain    Historical Provider, MD   Allergies Allergies  Allergen Reactions  . Codeine Nausea And Vomiting  . Erythromycin Nausea And Vomiting  . Hydrocodone-Acetaminophen Nausea And Vomiting  . Hydromorphone Nausea And Vomiting  . Propoxyphene Hcl Nausea And Vomiting  . Statins Other (See Comments)    Leg cramps  . Acetaminophen Other (See Comments)    Does not tolerate well, nausea  . Eggs Or Egg-Derived Products     "pt does not eat"  . Ibuprofen Other (See Comments)    Does not tolerate well  . Shellfish-Derived Products     "pt does not eat"    Family History Family History  Problem Relation Age of Onset  . Heart attack Mother   . Heart attack Father   . Hypertension Brother   . Diabetes Brother   . Hypertension Brother    Social History  reports that she quit smoking about 10 years ago. Her smoking use included Cigarettes. She has a 78 pack-year smoking history. She has never used smokeless tobacco. She reports that she drinks about 7 ounces of alcohol per week. She reports that she does not use illicit drugs.  Review Of Systems:  Unobtainable, sedated vent  Brief patient description:  25  s/p aorto-bifem, Post op reso failure. Concerns hypoperfused rt foot.  Events Since Admission: 9/23- Aorto BiFem, post op vent  Current Status:  Vital Signs: Temp:  [98.1 F (36.7 C)-99.9 F (37.7 C)] 99.2 F (37.3 C) (09/23 2013) Pulse Rate:  [76-87] 87  (09/23 2000) Resp:  [11-18] 11  (09/23 2000) BP: (98-150)/(51-74) 103/64 mmHg (09/23 2000) SpO2:  [95 %-100 %] 100 % (09/23 2000) Arterial Line BP: (104-141)/(58-67) 114/60 mmHg (09/23 2000) FiO2 (%):  [40 %-50 %] 40 % (09/23 1943) Weight:  [  72.848 kg (160 lb 9.6 oz)] 72.848 kg (160 lb 9.6 oz) (09/23 1752)  Physical Examination: Gen: sedated on vent HEENT: NCAT, PERRL, ETT in place PULM: CTA B CV: RRR, no mgr, no JVD AB: BS infrequent, midline scar well healed, soft, nontender, no hsm Ext: L leg warm, R cool, mottled no edema, no clubbing, no cyanosis Derm: no rash or skin breakdown Neuro: sedated on vent, arouses easily, follows commands   Active Problems:  CORONARY ATHEROSCLEROSIS NATIVE CORONARY ARTERY  UNSPECIFIED PERIPHERAL VASCULAR DISEASE  Atherosclerotic PVD with intermittent claudication  Respiratory failure, post-operative   ASSESSMENT AND PLAN  PULMONARY  Lab 03/26/12 1801 03/26/12 1511 03/26/12 1209 03/21/12 1443  PHART 7.335* 7.336* 7.382 7.438  PCO2ART 46.1* 49.4* 42.6 38.0  PO2ART 151.0* 356.0* 200.0* 95.1  HCO3 24.4* 26.3* 25.5* 25.2*  O2SAT 99.0 100.0 100.0 98.3   Ventilator Settings: Vent Mode:  [-] PRVC FiO2 (%):  [40 %-50 %] 40 % Set Rate:  [12 bmp-14 bmp] 14 bmp Vt Set:  [450 mL] 450 mL PEEP:  [5 cmH20] 5 cmH20 Pressure Support:  [10 cmH20] 10 cmH20 Plateau Pressure:  [16 cmH20] 16 cmH20 CXR:  ETT slight high ( wil advance), no infiltrates, swan ETT:  9/23>>>  A:  Post op resp failure P:   Advance ETT ABg reviewed on prior SIMV setgins Transition to [facility] Maintain similar TV  pcxr in am  Likely will sbt in am  To goal 40% , successful  CARDIOVASCULAR No results found for this  basename: TROPONINI:5,LATICACIDVEN:5, O2SATVEN:5,PROBNP:5 in the last 168 hours ECG:  pending Lines: rt IJ swan 9/23>>> A: Normotension, r/o peri ischemia P:  MAP goals 70 in setting grafts Treat if MAP greater 105 No lasix Volume ECG  RENAL  Lab 03/26/12 1755 03/26/12 1522 03/26/12 1511 03/26/12 1313 03/26/12 1209 03/21/12 1443  NA 136 137 137 139 136 --  K 4.4 4.1 -- -- -- --  CL 103 -- -- -- -- 98  CO2 24 -- -- -- -- 29  BUN 12 -- -- -- -- 16  CREATININE 0.66 -- -- -- -- 0.80  CALCIUM 7.6* -- -- -- -- 9.6  MG 1.2* -- -- -- -- --  PHOS -- -- -- -- -- --   Intake/Output      09/23 0701 - 09/24 0700   I.V. (mL/kg) 6523.2 (89.5)   Blood 1000   NG/GT 30   IV Piggyback 750   Total Intake(mL/kg) 8303.2 (114)   Urine (mL/kg/hr) 1200 (1.2)   Blood 1000   Total Output 2200   Net +6103.2        Foley:  9/23>>>  A:  Hypomagnesemia P:   replace mag 6 grams, recheck in am  Maintenance at 75  GASTROINTESTINAL  Lab 03/21/12 1443  AST 15  ALT 9  ALKPHOS 71  BILITOT 0.1*  PROT 7.0  ALBUMIN 3.5    A:  NPO, at risk Ileus, GERD P:   ppi Npo lft in am   HEMATOLOGIC  Lab 03/26/12 1755 03/26/12 1522 03/26/12 1511 03/26/12 1313 03/26/12 1209 03/21/12 1443  HGB 9.9* 9.5* 9.5* 9.2* 8.2* --  HCT 29.1* 28.0* 28.0* 27.0* 24.0* --  PLT 172 -- -- -- -- 291  INR 1.18 -- -- -- -- 0.93  APTT 40* -- -- -- -- 30   A:  Anemia P:  Post op cbc reviewed Limit scd with poor perfusion lower ext  INFECTIOUS  Lab 03/26/12 1755 03/21/12 1443  WBC 8.9 6.1  PROCALCITON -- --   Cultures:  Antibiotics: Peri op cefuoxime  A:  Peri op abx, at risk rt lower ext infection P:   Per Dr Arbie Cookey recs Follow fever curve , clinically lower  ENDOCRINE  Lab 03/26/12 2035 03/26/12 1805 03/26/12 0622  GLUCAP 161* 162* 149*   A:  hyperglycemia   P:   SSI q4h  NEUROLOGIC  A:  Dyschrony, agitation, pain P:   Daily WUA Propofol, monitor BO fent addition  BEST PRACTICE /  DISPOSITION Level of Care:  ICU Primary Service:  Vasc Consultants:  PCCM Code Status:  Full Diet:  npo DVT Px:  GI Px:  ppi Skin Integrity:  normal Social / Family:  None at bedside  CC 40 minutes.  [name] PCCM Pager: [phone] Cell: [phone] If no response, call [phone]

## 2012-03-26 NOTE — OR Nursing (Signed)
Patient stated no allergy to shellfish or iodine.

## 2012-03-26 NOTE — H&P (View-Only) (Signed)
The patient presents today for continued discussion of her lower surety arterial insufficiency. She has an extremely extensive past history. She has undergone staged bilateral femoral-popliteal bypasses and multiple interventions in her iliac arteries with the angioplasty and stenting bilaterally. She had a right external iliac artery plasty and stenting in March and July of this year. She's had very early recurrent stenoses in these. He underwent noninvasive studies and my last visit with her in our office on 02/20/2012 showing systolic velocities at in the 450 cm/s and greater in both external iliac arteries above her femoropopliteal bypasses. That time her right arm index on the right was 0.38 and was 0.65 on the left. She has returned from a trip with her husband to [place] and reports she is having progressively severe difficulty with walking and has severe right leg rest pain. She does have some very superficial ulceration on her right great toe but no other tissue loss.  Past Medical History  Diagnosis Date  . Hypertension     Unspecified  . Coronary artery disease     a. s/p NSTEMI 02/2009 - PCI LCX with Xience DES. Otherwise branch vessel and Dist. RCA dzs. NL EF.   . Hyperlipidemia     Mixed  . Tobacco abuse     Remote  . Peripheral vascular disease     unspecified, a. s/p L CEA b. s/p B fem-pop bypass  . Loose, teeth     has loose bridge and two loose teeth holding it  . Raynaud disease   . PONV (postoperative nausea and vomiting)   . Type II diabetes mellitus   . GERD (gastroesophageal reflux disease)   . Anxiety     History  Substance Use Topics  . Smoking status: Former Smoker -- 2.0 packs/day for 39 years    Types: Cigarettes    Quit date: 07/05/2001  . Smokeless tobacco: Never Used  . Alcohol Use: 7.0 oz/week    14 drink(s) per week    Family History  Problem Relation Age of Onset  . Heart attack Mother   . Heart attack Father   . Hypertension Brother   . Diabetes  Brother   . Hypertension Brother     Allergies  Allergen Reactions  . Codeine Nausea And Vomiting  . Erythromycin Nausea And Vomiting  . Hydrocodone-Acetaminophen Nausea And Vomiting  . Hydromorphone Nausea And Vomiting  . Propoxyphene Hcl Nausea And Vomiting  . Statins Other (See Comments)    Leg cramps  . Acetaminophen Other (See Comments)    Does not tolerate well  . Ibuprofen Other (See Comments)    Does not tolerate well    Current outpatient prescriptions:ALPRAZolam (XANAX) 0.25 MG tablet, Take 0.25 mg by mouth daily. , Disp: , Rfl: ;  aspirin 81 MG tablet, Take 81 mg by mouth daily. , Disp: , Rfl: ;  calcium carbonate (TUMS) 500 MG chewable tablet, Chew 1-4 tablets by mouth daily as needed. For acid reflux. Pt can take 1 to 4 tabs as needed for reflux, Disp: , Rfl:  Cholecalciferol (VITAMIN D) 2000 UNITS CAPS, Take by mouth. Takes 25,000 units three times weekly, Disp: , Rfl: ;  clopidogrel (PLAVIX) 75 MG tablet, Take 1 tablet (75 mg total) by mouth daily., Disp: 30 tablet, Rfl: 9;  dexlansoprazole (DEXILANT) 60 MG capsule, Take 60 mg by mouth 2 (two) times daily., Disp: , Rfl: ;  glyBURIDE (DIABETA) 2.5 MG tablet, Take 2.5 mg by mouth daily. , Disp: , Rfl:  hydrochlorothiazide   25 MG tablet, Take 25 mg by mouth daily. , Disp: , Rfl: ;  iron polysaccharides (NIFEREX) 150 MG capsule, Take 150 mg by mouth 2 (two) times daily., Disp: , Rfl: ;  isosorbide mononitrate (IMDUR) 30 MG 24 hr tablet, Take 15 mg by mouth 2 (two) times daily., Disp: , Rfl: ;  metoprolol succinate (TOPROL-XL) 25 MG 24 hr tablet, TAKE 1 TABLET EVERY DAY, Disp: 30 tablet, Rfl: 1 nitroGLYCERIN (NITROSTAT) 0.4 MG SL tablet, Place 0.4 mg under the tongue every 5 (five) minutes as needed. For chest pain, Disp: , Rfl: ;  omega-3 acid ethyl esters (LOVAZA) 1 G capsule, Take 2 g by mouth 2 (two) times daily. , Disp: , Rfl: ;  pioglitazone-metformin (ACTOPLUS MET) 15-500 MG per tablet, Take 1 tablet by mouth daily. Brand name  only-does not work if components are given separately per patient, Disp: , Rfl:  PLAVIX 75 MG tablet, TAKE 1 TABLET BY MOUTH EVERY DAY WITH A MEAL, Disp: 30 tablet, Rfl: 11;  Probiotic Product (ACIDOPHILUS PEARLS PO), Take 1 capsule by mouth daily., Disp: , Rfl: ;  prochlorperazine (COMPAZINE) 10 MG tablet, Take 10 mg by mouth every 8 (eight) hours as needed. For nausea, Disp: , Rfl: ;  ramipril (ALTACE) 1.25 MG capsule, Take 1.25 mg by mouth daily., Disp: , Rfl:  sitaGLIPtan (JANUVIA) 100 MG tablet, Take 100 mg by mouth daily. , Disp: , Rfl:   BP 120/77  Pulse 89  Resp 18  Ht 5' 4" (1.626 m)  Wt 156 lb 11.2 oz (71.079 kg)  BMI 26.90 kg/m2  Body mass index is 26.90 kg/(m^2).       Physical exam: Well-developed well-nourished white female appearing stated age Heart regular rate and rhythm Chest clear bilaterally Radial pulses 2+ radial she has a 1-2+ left femoral and absent right femoral pulses. I do not palpate distal pulses bilaterally. She does have dependent rubor in her right foot and no significant tissue loss of the and superficial ulceration the tip of her right great toe Neurologically she is grossly intact  I reviewed her most recent angiograms and discuss these length again with the patient and her husband present. I explained the options of stenting versus bifemoral bypass. I have been telling her for approximately one to 2 years felt that she would be best served with her to femoral bypass due to her early recurrent disease or external iliac. 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 I discussed the magnitude of aortofemoral bypass grafting including a 2-3% chance of mortality and a 10-15% major morbidity to include cardiac pulmonary renal or other. I explained the low risk of infection and a devastating consequences of this. They understand and wish to proceed with surgery. We have scheduled this for 03/26/2012. She will see  Dr. Cooper for cardiac clearance prior to this. I discussed the typical 1-2 day intensive care unit stay and 5-7 day total hospitalization assuming no complications 

## 2012-03-26 NOTE — Anesthesia Preprocedure Evaluation (Signed)
Anesthesia Evaluation  Patient identified by MRN, date of birth, ID band Patient awake    Reviewed: Allergy & Precautions, H&P , NPO status , Patient's Chart, lab work & pertinent test results  History of Anesthesia Complications (+) PONV  Airway Mallampati: II TM Distance: >3 FB Neck ROM: Full    Dental  (+) Edentulous Upper, Missing and Dental Advisory Given   Pulmonary COPDformer smoker,  breath sounds clear to auscultation  Pulmonary exam normal       Cardiovascular hypertension, Pt. on medications and Pt. on home beta blockers + CAD, + Cardiac Stents (DES 2010, preserved LVF) and + Peripheral Vascular Disease (mult iliac and fem vasc procedures, aorto-iliac occlusive disease) Rhythm:Regular Rate:Normal     Neuro/Psych PSYCHIATRIC DISORDERS Anxiety negative neurological ROS     GI/Hepatic Neg liver ROS, GERD-  Medicated and Poorly Controlled,  Endo/Other  diabetes (glu 149), Well Controlled, Type 2, Oral Hypoglycemic Agents  Renal/GU negative Renal ROS     Musculoskeletal   Abdominal   Peds  Hematology   Anesthesia Other Findings   Reproductive/Obstetrics                           Anesthesia Physical Anesthesia Plan  ASA: III  Anesthesia Plan: General   Post-op Pain Management:    Induction: Intravenous  Airway Management Planned: Oral ETT  Additional Equipment: Arterial line, PA Cath and Ultrasound Guidance Line Placement  Intra-op Plan:   Post-operative Plan: Possible Post-op intubation/ventilation  Informed Consent: I have reviewed the patients History and Physical, chart, labs and discussed the procedure including the risks, benefits and alternatives for the proposed anesthesia with the patient or authorized representative who has indicated his/her understanding and acceptance.   Dental advisory given  Plan Discussed with: CRNA and Surgeon  Anesthesia Plan Comments:  (Plan routine monitors, A-line, PA cath, GETA)        Anesthesia Quick Evaluation

## 2012-03-26 NOTE — OR Nursing (Signed)
Procedure 1 AAA start at 0832, end at 1308. Procedure 2 right femoral exploration start at 1332

## 2012-03-26 NOTE — Interval H&P Note (Signed)
History and Physical Interval Note:  03/26/2012 7:18 AM  [name]  has presented today for surgery, with the diagnosis of PVD  The various methods of treatment have been discussed with the patient and family. After consideration of risks, benefits and other options for treatment, the patient has consented to  Procedure(s) (LRB) with comments: AORTA BIFEMORAL BYPASS GRAFT (N/A) as a surgical intervention .  The patient's history has been reviewed, patient examined, no change in status, stable for surgery.  I have reviewed the patient's chart and labs.  Questions were answered to the patient's satisfaction.     Dakia Schifano

## 2012-03-26 NOTE — OR Nursing (Addendum)
SICU notification @1650  1st call SICU on transport time @1715 

## 2012-03-26 NOTE — Progress Notes (Signed)
Orders confirmed with Dr. Tyson Alias, vent rate decreased to 12 again and ETT repositioned and secured at 23.

## 2012-03-27 ENCOUNTER — Inpatient Hospital Stay (HOSPITAL_COMMUNITY): Payer: Medicare Other

## 2012-03-27 ENCOUNTER — Encounter (HOSPITAL_COMMUNITY): Payer: Self-pay | Admitting: Vascular Surgery

## 2012-03-27 DIAGNOSIS — E119 Type 2 diabetes mellitus without complications: Secondary | ICD-10-CM

## 2012-03-27 DIAGNOSIS — I10 Essential (primary) hypertension: Secondary | ICD-10-CM

## 2012-03-27 LAB — COMPREHENSIVE METABOLIC PANEL
AST: 26 U/L (ref 0–37)
BUN: 11 mg/dL (ref 6–23)
CO2: 24 mEq/L (ref 19–32)
Chloride: 102 mEq/L (ref 96–112)
Creatinine, Ser: 0.61 mg/dL (ref 0.50–1.10)
GFR calc Af Amer: >90 mL/min (ref >90)
GFR calc non Af Amer: >90 mL/min (ref >90)
Glucose, Bld: 209 mg/dL — ABNORMAL HIGH (ref 70–99)
Total Bilirubin: 0.3 mg/dL (ref 0.3–1.2)

## 2012-03-27 LAB — CBC WITH DIFFERENTIAL/PLATELET
Basophils Absolute: 0 10*3/uL (ref 0.0–0.1)
Eosinophils Absolute: 0 10*3/uL (ref 0.0–0.7)
Eosinophils Relative: 0 % (ref 0–5)
MCH: 27.1 pg (ref 26.0–34.0)
MCV: 82.1 fL (ref 78.0–100.0)
Monocytes Absolute: 0.6 10*3/uL (ref 0.1–1.0)
Platelets: 187 10*3/uL (ref 150–400)
RDW: 16.1 % — ABNORMAL HIGH (ref 11.5–15.5)

## 2012-03-27 LAB — POCT I-STAT 3, ART BLOOD GAS (G3+)
Patient temperature: 37.4
pH, Arterial: 7.381 (ref 7.350–7.450)

## 2012-03-27 LAB — GLUCOSE, CAPILLARY
Glucose-Capillary: 88 mg/dL (ref 70–99)
Glucose-Capillary: 91 mg/dL (ref 70–99)
Glucose-Capillary: 128 mg/dL — ABNORMAL HIGH (ref 70–99)

## 2012-03-27 LAB — AMYLASE: Amylase: 28 U/L (ref 0–105)

## 2012-03-27 MED ORDER — MORPHINE SULFATE (PF) 1 MG/ML IV SOLN
INTRAVENOUS | Status: DC
  Administered 2012-03-27: 13.5 mg via INTRAVENOUS
  Administered 2012-03-27: 6 mg via INTRAVENOUS
  Administered 2012-03-27: 12 mg via INTRAVENOUS
  Administered 2012-03-27: 11:00:00 via INTRAVENOUS
  Administered 2012-03-28: 2.7 mg via INTRAVENOUS
  Administered 2012-03-28: 13:00:00 via INTRAVENOUS
  Administered 2012-03-28: 12 mg via INTRAVENOUS
  Administered 2012-03-28: 9.51 mg via INTRAVENOUS
  Administered 2012-03-28: 03:00:00 via INTRAVENOUS
  Administered 2012-03-28: 7.5 mg via INTRAVENOUS
  Administered 2012-03-28: 10.5 mg via INTRAVENOUS
  Administered 2012-03-28: 3 mg via INTRAVENOUS
  Administered 2012-03-28: 20.5 mg via INTRAVENOUS
  Filled 2012-03-27 (×5): qty 25

## 2012-03-27 MED ORDER — HETASTARCH-ELECTROLYTES 6 % IV SOLN
INTRAVENOUS | Status: AC
  Administered 2012-03-27: 500 mL
  Filled 2012-03-27: qty 500

## 2012-03-27 MED ORDER — PANTOPRAZOLE SODIUM 40 MG IV SOLR
40.0000 mg | INTRAVENOUS | Status: DC
  Administered 2012-03-27: 40 mg via INTRAVENOUS
  Filled 2012-03-27 (×3): qty 40

## 2012-03-27 MED ORDER — HETASTARCH-ELECTROLYTES 6 % IV SOLN
500.0000 mL | Freq: Once | INTRAVENOUS | Status: AC
  Administered 2012-03-27: 500 mL via INTRAVENOUS

## 2012-03-27 MED ORDER — INFLUENZA VIRUS VACC SPLIT PF IM SUSP: 0.5000 mL | INTRAMUSCULAR | Status: DC | PRN

## 2012-03-27 MED ORDER — SODIUM CHLORIDE 0.9 % IV BOLUS (SEPSIS)
500.0000 mL | Freq: Once | INTRAVENOUS | Status: AC
  Administered 2012-03-27: 500 mL via INTRAVENOUS

## 2012-03-27 NOTE — Care Management Note (Signed)
    Page 1 of 2   04/09/2012     2:20:44 PM   CARE MANAGEMENT NOTE 04/09/2012  Patient:  [name], [name]   Account Number:  [id number]  Date Initiated:  03/27/2012  Documentation initiated by:  Zhane Bluitt  Subjective/Objective Assessment:   PT S/P AORTOBIFEMORAL BYPASS GRAFT, THROMBECTOMY OF RT FEM-POP GRAFT, AND UMBILICAL HERNIA REPAIR ON 03/26/12. PTA, PT INDEPENDENT, LIVES WITH SPOUSE.     Action/Plan:   HUSBAND TO PROVIDE CARE AT DISCHARGE.  WILL FOLLOW FOR HOME NEEDS AS PT PROGRESSES.   Anticipated DC Date:  04/09/2012   Anticipated DC Plan:  HOME W HOME HEALTH SERVICES      DC Planning Services  CM consult      Choice offered to / List presented to:             Status of service:  Completed, signed off Medicare Important Message given?   (If response is "NO", the following Medicare IM given date fields will be blank) Date Medicare IM given:   Date Additional Medicare IM given:    Discharge Disposition:  HOME/SELF CARE  Per UR Regulation:  Reviewed for med. necessity/level of care/duration of stay  If discussed at Long Length of Stay Meetings, dates discussed:   04/03/2012  04/05/2012    Comments:  04/09/12 Martine Trageser,RN,BSN [phone] PT GIVEN RX FOR WHEELCHAIR, SO THAT SHE MAY OBTAIN ON HER OWN UPON DC.  DOES NOT WANT TO WAIT FOR DME TO BE DELIVERED TO HOSPITAL ROOM.  04/05/12  1306  CRYSTAL SIMMONS RN, BSN [phone] PT CURRENTLY IN CATH LAB;  NCM WILL FOLLOW.  04/03/12 1102  CRYSTAL SIMMONS RN, BSN [phone] S/P NSTEMI POST FEM BYPASS; LASIX CHANGED TO PO,  METOPRLOL INCREASED- PLAN FOR CATH ON THURSDAY 04/05/12. NCM WILL FOLLOW.

## 2012-03-27 NOTE — Progress Notes (Signed)
eLink Physician-Brief Progress Note Patient Name: [name] DOB: [birth year] MRN: [id number]  Date of Service  03/27/2012   HPI/Events of Note   Progressive tachy, sys borderline, no distress  eICU Interventions  Cvp, bolus x 1 R/o beta blcoker WD, may also have xanax WD May require low dose benzo Unable to use metop now   Intervention Category Major Interventions: Arrhythmia - evaluation and management  FEINSTEIN,DANIEL J. 03/27/2012, 8:53 PM

## 2012-03-27 NOTE — Evaluation (Signed)
Physical Therapy Evaluation Patient Details Name: [name] MRN: [id number] DOB: 10-11-1945 Today's Date: 03/27/2012 Time: 7829-5621 PT Time Calculation (min): 22 min  PT Assessment / Plan / Recommendation Clinical Impression  Pt s/p aortobifemoral bypass with post op respiratory failure.  Pt without pulses in rt foot and with possible further surgery needed.  Pt needed a lot of encouragement to sit in chair.  Anxious about sitting in chair even after benefits explained.    PT Assessment  Patient needs continued PT services    Follow Up Recommendations  Home health PT;Supervision/Assistance - 24 hour    Barriers to Discharge        Equipment Recommendations  None recommended by PT    Recommendations for Other Services     Frequency Min 3X/week    Precautions / Restrictions Precautions Precautions: Fall   Pertinent Vitals/Pain VSS      Mobility  Bed Mobility Bed Mobility: Supine to Sit Supine to Sit: 3: Mod assist Details for Bed Mobility Assistance: Pt has a very specific technique she and her husband use for assisting her to EOB. Transfers Transfers: Stand Pivot Transfers Sit to Stand: With upper extremity assist;From bed;1: +2 Total assist Sit to Stand: Patient Percentage: 80% Stand to Sit: 1: +2 Total assist;To chair/3-in-1;With upper extremity assist Stand to Sit: Patient Percentage: 80% Stand Pivot Transfers: 1: +2 Total assist (using RW) Stand Pivot Transfers: Patient Percentage: 80% Details for Transfer Assistance: Pt requiring therapists hold her hands in a specific way to assist with sit to stand.    Exercises     PT Diagnosis: Difficulty walking;Generalized weakness  PT Problem List: Decreased strength;Decreased activity tolerance;Decreased balance;Decreased mobility;Decreased knowledge of use of DME PT Treatment Interventions: DME instruction;Gait training;Functional mobility training;Therapeutic activities;Stair training;Therapeutic exercise;Balance  training;Patient/family education   PT Goals Acute Rehab PT Goals PT Goal Formulation: With patient Time For Goal Achievement: 03/27/12 Potential to Achieve Goals: Good Pt will go Supine/Side to Sit: with supervision PT Goal: Supine/Side to Sit - Progress: Goal set today Pt will go Sit to Supine/Side: with supervision PT Goal: Sit to Supine/Side - Progress: Goal set today Pt will go Sit to Stand: with supervision PT Goal: Sit to Stand - Progress: Goal set today Pt will go Stand to Sit: with supervision PT Goal: Stand to Sit - Progress: Goal set today Pt will Ambulate: 51 - 150 feet;with supervision;with least restrictive assistive device PT Goal: Ambulate - Progress: Goal set today Pt will Go Up / Down Stairs: 3-5 stairs;with min assist;with rail(s) PT Goal: Up/Down Stairs - Progress: Goal set today  Visit Information  Last PT Received On: 03/27/12 Assistance Needed: +2    Subjective Data  Subjective: "How long is enough?" pt asked about sitting in the chair. Patient Stated Goal: Return home with family.   Prior Functioning  Home Living Lives With: Spouse Available Help at Discharge: Family Type of Home: House Home Access: Stairs to enter Secretary/administrator of Steps: 3 Entrance Stairs-Rails: Right Home Layout: One level Bathroom Shower/Tub: Walk-in shower;Door [employer] Toilet: Standard Home Adaptive Equipment: Environmental consultant - rolling;Built-in shower seat Prior Function Level of Independence: Independent Able to Take Stairs?: Yes Driving: Yes Communication Communication: No difficulties Dominant Hand: Right    Cognition  Overall Cognitive Status: Impaired Area of Impairment: Other (comment) (pt with difficulty understanding the benefits of OOB activit) Arousal/Alertness: Awake/alert Orientation Level: Appears intact for tasks assessed Behavior During Session: Anxious Cognition - Other Comments: husband was quick to answer questions of the patient.  pt  noted to begin  to escalate in her anxiety about sitting up in chair.  Repeatedly asked to be told each step of what she was being asked to do.    Extremity/Trunk Assessment Right Upper Extremity Assessment RUE ROM/Strength/Tone: WFL for tasks assessed (mild edema) Left Upper Extremity Assessment LUE ROM/Strength/Tone: WFL for tasks assessed (mild edema) Right Lower Extremity Assessment RLE ROM/Strength/Tone: Deficits RLE ROM/Strength/Tone Deficits: grossly 4/5 Left Lower Extremity Assessment LLE ROM/Strength/Tone: Deficits LLE ROM/Strength/Tone Deficits: grossly 4/5 Trunk Assessment Trunk Assessment: Normal   Balance Balance Balance Assessed: Yes Static Sitting Balance Static Sitting - Balance Support: Bilateral upper extremity supported (on walker) Static Sitting - Level of Assistance: 4: Min assist Static Sitting - Comment/# of Minutes: 5  End of Session PT - End of Session Equipment Utilized During Treatment: Gait belt Activity Tolerance: Patient tolerated treatment well Patient left: in chair;with call bell/phone within reach Nurse Communication: Mobility status (nurse tech)  GP     Skip Mayer 03/27/2012, 4:04 PM  Saulo Anthis PT [phone]

## 2012-03-27 NOTE — Anesthesia Postprocedure Evaluation (Signed)
  Anesthesia Post-op Note  Patient: [name]  Procedure(s) Performed: Procedure(s) (LRB) with comments: AORTA BIFEMORAL BYPASS GRAFT (N/A) - Aortic-bifemoral bypass using 14x23mm Hemashield graft .  HERNIA REPAIR UMBILICAL ADULT (N/A) - Removal of Umbilical hernia sac FEMORAL ARTERY EXPLORATION (Right) - with Revision of Popliteal-Peroneal bypass graft using 6mm x 10cm thin wall goretex graft  Patient Location: ICU  Anesthesia Type: General  Level of Consciousness: awake, alert  and oriented  Airway and Oxygen Therapy: Patient Spontanous Breathing and Patient connected to nasal cannula oxygen  Post-op Pain: none  Post-op Assessment: Post-op Vital signs reviewed, Patient's Cardiovascular Status Stable, Respiratory Function Stable, Patent Airway, No signs of Nausea or vomiting, Adequate PO intake and Pain level controlled  Post-op Vital Signs: Reviewed and stable  Complications: No apparent anesthesia complications

## 2012-03-27 NOTE — Progress Notes (Signed)
Subjective: Interval History: none..Alert on vent. Reports good pain control   Objective: Vital signs in last 24 hours: Temp:  [99.2 F (37.3 C)-100 F (37.8 C)] 99.3 F (37.4 C) (09/24 0600) Pulse Rate:  [76-95] 90  (09/24 0600) Resp:  [8-19] 13  (09/24 0600) BP: (91-150)/(50-74) 95/71 mmHg (09/24 0600) SpO2:  [96 %-100 %] 100 % (09/24 0600) Arterial Line BP: (82-141)/(49-75) 121/61 mmHg (09/24 0600) FiO2 (%):  [40 %-50 %] 40 % (09/24 0331) Weight:  [160 lb 9.6 oz (72.848 kg)] 160 lb 9.6 oz (72.848 kg) (09/23 1752)  Intake/Output from previous day: 09/23 0701 - 09/24 0700 In: 9405.4 [I.V.:7570.4; Blood:1000; NG/GT:30; IV Piggyback:805] Out: 2990 [Urine:1990; Blood:1000] Intake/Output this shift: Total I/O In: 1102.2 [I.V.:1047.2; IV Piggyback:55] Out: 790 [Urine:790]  Abd soft. 3+ fem pulse bilat. Left foot warm with good pt signal.  Right foot warm but dusky in toes.  No doppler flow in R foot  Lab Results:  [facility] 03/27/12 0341 03/26/12 1755  WBC 9.6 8.9  HGB 9.4* 9.9*  HCT 28.5* 29.1*  PLT 187 172   BMET  Basename 03/27/12 0341 03/26/12 1755  NA 136 136  K 3.9 4.4  CL 102 103  CO2 24 24  GLUCOSE 209* 180*  BUN 11 12  CREATININE 0.61 0.66  CALCIUM 8.2* 7.6*    Studies/Results: Dg Chest 2 View  03/21/2012  *RADIOLOGY REPORT*  Clinical Data: Preadmission chest radiograph.  No complaints.  CHEST - 2 VIEW  Comparison: None.  Findings: Cardiopericardial silhouette is within normal limits for projection.  Aortic arch atherosclerosis.  Symmetric bilateral pleural apical thickening.  No airspace disease.  No effusion. Small calcified granuloma at the right apex interposed between the right posterior fourth and fifth ribs.  Similar calcified granuloma is present at the left apex.  No definite calcified lymph nodes are identified.  IMPRESSION: No active cardiopulmonary disease.  Old granulomatous disease.  The   Original Report Authenticated By: Andreas Newport, M.D.     Dg Chest Portable 1 View  03/26/2012  *RADIOLOGY REPORT*  Clinical Data: Postop central line placement.  PORTABLE CHEST - 1 VIEW  Comparison: 03/21/2012  Findings: A right jugular Swan-Ganz catheter is seen with tip in the proximal right pulmonary artery.  Endotracheal tube and nasogastric tube are seen in appropriate position.  No pneumothorax identified.  Both lungs are clear.  Heart size is stable.  IMPRESSION:  No active lung disease.  No pneumothorax identified.   Original Report Authenticated By: Danae Orleans, M.D.    Dg Abd Portable 1v  03/26/2012  *RADIOLOGY REPORT*  Clinical Data: 66 year old female status post stent placement.  PORTABLE ABDOMEN - 1 VIEW  Comparison: CT 02/03/1812.  Findings: Portable supine view 1746 hours.  Enteric tube partially visible tip at the level of the gastric body.  Mild motion artifact.  Calcified atherosclerosis, severe in the pelvis and about the proximal lower extremities.  Bilateral common iliac artery stent re-identified.  Right external iliac artery stent re- identified.  Partial visualization of a stent in the proximal right lower extremity.  Paucity of bowel gas, blurred by motion. Questionable gas in the bladder, may reflect recent catheter placement.  IMPRESSION: 1.  Advanced pelvic and proximal femoral atherosclerosis with multiple vascular stent in place. 2.  Suggestion of gas within the bladder, query recent Foley placement/removal. 3.  Motion artifact affecting the visualized bowel gas.   Original Report Authenticated By: Harley Hallmark, M.D.    Anti-infectives: Anti-infectives  Start     Dose/Rate Route Frequency Ordered Stop   03/26/12 1745   cefUROXime (ZINACEF) 1.5 g in dextrose 5 % 50 mL IVPB        1.5 g 100 mL/hr over 30 Minutes Intravenous Every 12 hours 03/26/12 1732 03/27/12 1744   03/26/12 1345   cefUROXime (ZINACEF) 750 mg in dextrose 5 % 50 mL IVPB        750 mg 100 mL/hr over 30 Minutes Intravenous  Once 03/26/12 1342      03/25/12 1452   cefUROXime (ZINACEF) 1.5 g in dextrose 5 % 50 mL IVPB        1.5 g 100 mL/hr over 30 Minutes Intravenous 30 min pre-op 03/25/12 1452 03/26/12 1345          Assessment/Plan: s/p Procedure(s) (LRB) with comments: AORTA BIFEMORAL BYPASS GRAFT (N/A) - Aortic-bifemoral bypass using 14x53mm Hemashield graft .  HERNIA REPAIR UMBILICAL ADULT (N/A) - Removal of Umbilical hernia sac FEMORAL ARTERY EXPLORATION (Right) - with Revision of Popliteal-Peroneal bypass graft using 6mm x 10cm thin wall goretex graft Wean and extubate per CCM.  Doubt Right foot is viable with  no runoff in tibials.  Cont to watch.  Discussed with pt and will discuss with husband this AM   LOS: 1 day   [name] 03/27/2012, 6:41 AM

## 2012-03-27 NOTE — Procedures (Signed)
Extubation Procedure Note  Patient Details:   Name: [name] DOB: [birth year] MRN: [id number]   Airway Documentation:     Evaluation  O2 sats: stable throughout Complications: No apparent complications Patient did tolerate procedure well. Bilateral Breath Sounds: Fine crackles Suctioning: Airway Yes  Newt Lukes 03/27/2012, 9:10 AM

## 2012-03-27 NOTE — Evaluation (Signed)
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  Pt demonstrating increased anxiety and attempted to direct therapy session.  Required encouragement to remain up in chair.  Expect pt will progress to being able to return home with assist of her husband.  Will follow to address ADL mobility for self care.    OT Assessment  Patient needs continued OT Services    Follow Up Recommendations  Home health OT;Supervision/Assistance - 24 hour    Barriers to Discharge      Equipment Recommendations  None recommended by OT    Recommendations for Other Services    Frequency  Min 2X/week    Precautions / Restrictions Precautions Precautions: Fall   Pertinent Vitals/Pain     ADL  Eating/Feeding: NPO Where Assessed - Eating/Feeding: Chair Grooming: Simulated;Minimal assistance Where Assessed - Grooming: Unsupported sitting Upper Body Bathing: Simulated;Minimal assistance Where Assessed - Upper Body Bathing: Unsupported sitting Lower Body Bathing: Simulated;+1 Total assistance Where Assessed - Lower Body Bathing: Supported sit to stand Upper Body Dressing: Simulated;Minimal assistance Where Assessed - Upper Body Dressing: Unsupported sitting Lower Body Dressing: Simulated;+1 Total assistance Where Assessed - Lower Body Dressing: Supported sit to stand Toilet Transfer: Simulated;+2 Total assistance Toilet Transfer: Patient Percentage: 80% Statistician Method: Sit to Barista: Other (comment) (bed to recliner) Equipment Used: Rolling walker Transfers/Ambulation Related to ADLs: +2 total assist, pt 47% for safety and lines    OT Diagnosis: Generalized  weakness;Acute pain  OT Problem List: Decreased activity tolerance;Impaired balance (sitting and/or standing);Decreased knowledge of use of DME or AE;Pain OT Treatment Interventions: Self-care/ADL training;DME and/or AE instruction;Patient/family education;Balance training   OT Goals Acute Rehab OT Goals OT Goal Formulation: With patient Time For Goal Achievement: 04/10/12 Potential to Achieve Goals: Good ADL Goals Pt Will Perform Grooming: with supervision;Standing at sink ADL Goal: Grooming - Progress: Goal set today Pt Will Perform Upper Body Bathing: with set-up;Sitting, edge of bed ADL Goal: Upper Body Bathing - Progress: Goal set today Pt Will Perform Lower Body Bathing: with supervision;Sit to stand from bed ADL Goal: Lower Body Bathing - Progress: Goal set today Pt Will Perform Upper Body Dressing: with set-up;Sitting, bed ADL Goal: Upper Body Dressing - Progress: Goal set today Pt Will Perform Lower Body Dressing: with supervision;Sit to stand from bed ADL Goal: Lower Body Dressing - Progress: Goal set today Pt Will Transfer to Toilet: with supervision;Ambulation;with DME ADL Goal: Toilet Transfer - Progress: Goal set today Pt Will Perform Toileting - Clothing Manipulation: with supervision;Standing ADL Goal: Toileting - Clothing Manipulation - Progress: Goal set today Pt Will Perform Toileting - Hygiene: with supervision;Leaning right and/or left on 3-in-1/toilet ADL Goal: Toileting - Hygiene - Progress: Goal set today Pt Will Perform Tub/Shower Transfer: Shower transfer;Ambulation;with min assist ADL Goal: Tub/Shower Transfer - Progress: Goal set today  Visit Information  Last OT Received On: 03/27/12 Assistance Needed: +2 PT/OT Co-Evaluation/Treatment: Yes    Subjective Data  Subjective: "I could do everything before, but slowly." Patient Stated Goal: Home with assist of her family.   Prior Functioning  Vision/Perception  Home Living Lives With: Spouse Available  Help at Discharge: Family Type of Home: House Home Access: Stairs to enter [employer] of Steps: 3 Entrance Stairs-Rails: Right Home Layout: One  level Bathroom Shower/Tub: Walk-in shower;Door [employer] Toilet: Standard Home Adaptive Equipment: Environmental consultant - rolling;Built-in shower seat Prior Function Level of Independence: Independent Able to Take Stairs?: Yes Driving: Yes Communication Communication: No difficulties Dominant Hand: Right      Cognition  Overall Cognitive Status: Impaired Area of Impairment: Other (comment) (pt with difficulty understanding the benefits of OOB activit) Arousal/Alertness: Awake/alert Orientation Level: Appears intact for tasks assessed Behavior During Session: Anxious Cognition - Other Comments: husband was quick to answer questions of the patient.  pt noted to begin to escalate in her anxiety about sitting up in chair.  Repeatedly asked to be told each step of what she was being asked to do.    Extremity/Trunk Assessment Right Upper Extremity Assessment RUE ROM/Strength/Tone: [facility] for tasks assessed (mild edema) Left Upper Extremity Assessment LUE ROM/Strength/Tone: WFL for tasks assessed (mild edema) Trunk Assessment Trunk Assessment: Normal   Mobility  Shoulder Instructions  Bed Mobility Bed Mobility: Supine to Sit Supine to Sit: 3: Mod assist Details for Bed Mobility Assistance: Pt has a very specific technique she and her husband use for assisting her to EOB. Transfers Transfers: Sit to Stand;Stand to Sit Sit to Stand: With upper extremity assist;From bed;1: +2 Total assist Sit to Stand: Patient Percentage: 80% Stand to Sit: 1: +2 Total assist;To chair/3-in-1;With upper extremity assist Stand to Sit: Patient Percentage: 80% Details for Transfer Assistance: Pt requiring therapists hold her hands in a specific way to assist with sit to stand.       Exercise     Balance Balance Balance Assessed: Yes Static Sitting  Balance Static Sitting - Balance Support: Feet supported Static Sitting - Level of Assistance: 5: Stand by assistance Static Sitting - Comment/# of Minutes: 5   End of Session OT - End of Session Activity Tolerance: Patient tolerated treatment well Patient left: in chair;with call bell/phone within reach Nurse Communication: Other (comment);Mobility status (pt's anxiety level)  GO     Evern Bio 03/27/2012, 3:10 PM [phone]

## 2012-03-27 NOTE — Progress Notes (Signed)
Name: [name] MRN: [id number] DOB: Aug 02, 1945    LOS: 1  Referring Provider:  Dr Brantley Fling Reason for Referral:  Post op resp failure  PULMONARY / CRITICAL CARE MEDICINE  HPI:  66 year old h/o multiple vascular bypass procedures, PVC underwent elective Aortobifemoral bypass with 14 x 18 the shield graft with reimplantation of accessory left renal artery and thrombectomy of right femoral to below knee popliteal Gore-Tex graft extension to the peroneal artery with 6 mm Gore-Tex.  The surgery time was prolonged as complicated. Still with some concerns hypoperfused rt foot. Presents with post op resp failure.    Events Since Admission: 9/23- Aorto BiFem, post op vent  Current Status: Awake and alert, passed SBT.   Vital Signs: Temp:  [99 F (37.2 C)-100 F (37.8 C)] 99 F (37.2 C) (09/24 0800) Pulse Rate:  [76-95] 95  (09/24 0800) Resp:  [8-19] 16  (09/24 0800) BP: (91-150)/(30-74) 98/30 mmHg (09/24 0800) SpO2:  [93 %-100 %] 100 % (09/24 0800) Arterial Line BP: (81-141)/(49-75) 81/64 mmHg (09/24 0800) FiO2 (%):  [40 %-50 %] 40 % (09/24 0850) Weight:  [72.848 kg (160 lb 9.6 oz)-80 kg (176 lb 5.9 oz)] 80 kg (176 lb 5.9 oz) (09/24 0600)  Physical Examination: Gen: awake on vent HEENT: NCAT, PERRL, ETT in place PULM: CTA B CV: RRR, no mgr, no JVD AB: BS infrequent, midline scar well healed, soft, nontender, no hsm Ext: L leg warm, R cool, mottled no edema, no clubbing, no cyanosis Derm: no rash or skin breakdown Neuro: awake on vent,  follows commands   Active Problems:  CORONARY ATHEROSCLEROSIS NATIVE CORONARY ARTERY  UNSPECIFIED PERIPHERAL VASCULAR DISEASE  Atherosclerotic PVD with intermittent claudication  Respiratory failure, post-operative   ASSESSMENT AND PLAN  PULMONARY  Lab 03/27/12 0344 03/26/12 1801 03/26/12 1511 03/26/12 1209 03/21/12 1443  PHART 7.381 7.335* 7.336* 7.382 7.438  PCO2ART 41.8 46.1* 49.4* 42.6 38.0  PO2ART 75.0* 151.0* 356.0* 200.0*  95.1  HCO3 24.7* 24.4* 26.3* 25.5* 25.2*  O2SAT 94.0 99.0 100.0 100.0 98.3   Ventilator Settings: Vent Mode:  [-] PSV;CPAP FiO2 (%):  [40 %-50 %] 40 % Set Rate:  [12 bmp-14 bmp] 14 bmp Vt Set:  [450 mL] 450 mL PEEP:  [5 cmH20] 5 cmH20 Pressure Support:  [5 cmH20-10 cmH20] 5 cmH20 Plateau Pressure:  [16 cmH20-18 cmH20] 16 cmH20 CXR:  ETT slight high ( wil advance), no infiltrates, swan ETT:  9/23>>>  A:  Post op resp failure P:   Extubate IS  Mobilize     CARDIOVASCULAR No results found for this basename: TROPONINI:5,LATICACIDVEN:5, O2SATVEN:5,PROBNP:5 in the last 168 hours ECG: 9/23 NSR, no acute ST Seg changes Lines: rt IJ swan 9/23>>>   A: Normotension,no evident coronary ischemia postop.   S/p  Aortobifem and R popliteal to perineal artery graft 9/23.  Still no perfusion R foot 9/24  P:  Monitor Per [facility] Dr Early    RENAL  Lab 03/27/12 0341 03/26/12 1755 03/26/12 1522 03/26/12 1511 03/26/12 1313 03/21/12 1443  NA 136 136 137 137 139 --  K 3.9 4.4 -- -- -- --  CL 102 103 -- -- -- 98  CO2 24 24 -- -- -- 29  BUN 11 12 -- -- -- 16  CREATININE 0.61 0.66 -- -- -- 0.80  CALCIUM 8.2* 7.6* -- -- -- 9.6  MG 2.5 1.2* -- -- -- --  PHOS -- -- -- -- -- --   Intake/Output      09/23 0701 -  09/24 0700 09/24 0701 - 09/25 0700   I.V. (mL/kg) 7654.1 (95.7) 79.4 (1)   Blood 1000    NG/GT 30 30   IV Piggyback 805    Total Intake(mL/kg) 9489.1 (118.6) 109.4 (1.4)   Urine (mL/kg/hr) 1990 (1) 30   Blood 1000    Total Output 2990 30   Net +6499.1 +79.4         Foley:  9/23>>> Good UOP 9/24  A:  Hypomagnesemia>>repleted. No renal issues postop. Good UOP P:   Monitor bmet daily  GASTROINTESTINAL  Lab 03/27/12 0341 03/21/12 1443  AST 26 15  ALT 10 9  ALKPHOS 50 71  BILITOT 0.3 0.1*  PROT 5.0* 7.0  ALBUMIN 2.5* 3.5    A:  NPO, at risk Ileus, GERD, LFTs ok P:   ppi IV Npo    HEMATOLOGIC  Lab 03/27/12 0341 03/26/12 1755 03/26/12 1522 03/26/12 1511 03/26/12  1313 03/21/12 1443  HGB 9.4* 9.9* 9.5* 9.5* 9.2* --  HCT 28.5* 29.1* 28.0* 28.0* 27.0* --  PLT 187 172 -- -- -- 291  INR -- 1.18 -- -- -- 0.93  APTT -- 40* -- -- -- 30   A:  Anemia P:  tfx trigger for Hgb</= 7.0     INFECTIOUS  Lab 03/27/12 0341 03/26/12 1755 03/21/12 1443  WBC 9.6 8.9 6.1  PROCALCITON -- -- --   Cultures: None  Antibiotics: Peri op cefuoxime  A:  Peri op abx, at risk rt lower ext infection P:   Per Dr Arbie Cookey recs Follow fever curve    ENDOCRINE  Lab 03/27/12 0815 03/27/12 0342 03/27/12 0023 03/26/12 2035 03/26/12 1805  GLUCAP 88 199* 135* 161* 162*   A:  hyperglycemia   P:   SSI q4h per protocol  NEUROLOGIC  A:  No neuro deficits. Pain control good.  P:   D/c propofol with extubation. PCA morphine ordered.   BEST PRACTICE / DISPOSITION Level of Care:  ICU Primary Service:  Vasc Consultants:  PCCM Code Status:  Full Diet:  npo DVT Px:  GI Px:  ppi Skin Integrity:  normal Social / Family: husband updated at bedside 9/24 AM  CC 40 minutes.  Dorcas Carrow  PCCM [facility] Beeper  [phone]  Cell  [phone]  If no response or cell goes to voicemail, call beeper [phone]

## 2012-03-28 ENCOUNTER — Inpatient Hospital Stay (HOSPITAL_COMMUNITY): Payer: Medicare Other

## 2012-03-28 DIAGNOSIS — I2510 Atherosclerotic heart disease of native coronary artery without angina pectoris: Secondary | ICD-10-CM

## 2012-03-28 DIAGNOSIS — J9600 Acute respiratory failure, unspecified whether with hypoxia or hypercapnia: Secondary | ICD-10-CM

## 2012-03-28 DIAGNOSIS — I739 Peripheral vascular disease, unspecified: Secondary | ICD-10-CM

## 2012-03-28 DIAGNOSIS — I214 Non-ST elevation (NSTEMI) myocardial infarction: Secondary | ICD-10-CM

## 2012-03-28 DIAGNOSIS — J9601 Acute respiratory failure with hypoxia: Secondary | ICD-10-CM | POA: Diagnosis present

## 2012-03-28 DIAGNOSIS — L98499 Non-pressure chronic ulcer of skin of other sites with unspecified severity: Secondary | ICD-10-CM

## 2012-03-28 LAB — CBC
MCH: 27.4 pg (ref 26.0–34.0)
MCV: 85.7 fL (ref 78.0–100.0)
Platelets: 162 10*3/uL (ref 150–400)
RBC: 2.66 MIL/uL — ABNORMAL LOW (ref 3.87–5.11)
RDW: 17 % — ABNORMAL HIGH (ref 11.5–15.5)

## 2012-03-28 LAB — TROPONIN I: Troponin I: 2.28 ng/mL (ref <0.30)

## 2012-03-28 LAB — BASIC METABOLIC PANEL
Calcium: 8.2 mg/dL — ABNORMAL LOW (ref 8.4–10.5)
Creatinine, Ser: 0.55 mg/dL (ref 0.50–1.10)
GFR calc non Af Amer: >90 mL/min (ref >90)
Glucose, Bld: 146 mg/dL — ABNORMAL HIGH (ref 70–99)
Sodium: 140 mEq/L (ref 135–145)

## 2012-03-28 LAB — GLUCOSE, CAPILLARY
Glucose-Capillary: 127 mg/dL — ABNORMAL HIGH (ref 70–99)
Glucose-Capillary: 152 mg/dL — ABNORMAL HIGH (ref 70–99)

## 2012-03-28 MED ORDER — CHOLECALCIFEROL 625 MCG (25000 UT) PO CAPS: 25000.0000 [IU] | ORAL_CAPSULE | ORAL | Status: DC

## 2012-03-28 MED ORDER — FENTANYL BOLUS VIA INFUSION
50.0000 ug | Freq: Four times a day (QID) | INTRAVENOUS | Status: DC | PRN
  Filled 2012-03-28: qty 100

## 2012-03-28 MED ORDER — SODIUM CHLORIDE 0.9 % IV SOLN
INTRAVENOUS | Status: DC
  Administered 2012-03-28 – 2012-04-05 (×5): via INTRAVENOUS

## 2012-03-28 MED ORDER — ROCURONIUM BROMIDE 50 MG/5ML IV SOLN
50.0000 mg | Freq: Once | INTRAVENOUS | Status: AC
  Administered 2012-03-28: 10 mg via INTRAVENOUS

## 2012-03-28 MED ORDER — BISACODYL 10 MG RE SUPP
10.0000 mg | Freq: Once | RECTAL | Status: AC
  Administered 2012-03-28: 10 mg via RECTAL
  Filled 2012-03-28: qty 1

## 2012-03-28 MED ORDER — NITROGLYCERIN 2 % TD OINT
1.0000 [in_us] | TOPICAL_OINTMENT | Freq: Four times a day (QID) | TRANSDERMAL | Status: DC | PRN
  Filled 2012-03-28: qty 30

## 2012-03-28 MED ORDER — METOPROLOL TARTRATE 25 MG PO TABS
25.0000 mg | ORAL_TABLET | Freq: Two times a day (BID) | ORAL | Status: DC
  Administered 2012-03-29: 25 mg via ORAL
  Filled 2012-03-28 (×3): qty 1

## 2012-03-28 MED ORDER — CHLORHEXIDINE GLUCONATE 0.12 % MT SOLN
OROMUCOSAL | Status: AC
  Administered 2012-03-28: 15 mL
  Filled 2012-03-28: qty 15

## 2012-03-28 MED ORDER — METOPROLOL TARTRATE 25 MG PO TABS
25.0000 mg | ORAL_TABLET | Freq: Two times a day (BID) | ORAL | Status: DC
  Administered 2012-03-28: 25 mg via ORAL
  Filled 2012-03-28 (×3): qty 1

## 2012-03-28 MED ORDER — SODIUM CHLORIDE 0.9 % IV SOLN
50.0000 ug/h | INTRAVENOUS | Status: DC
  Administered 2012-03-29: 50 ug/h via INTRAVENOUS
  Filled 2012-03-28 (×3): qty 50

## 2012-03-28 MED ORDER — VITAMIN D (ERGOCALCIFEROL) 1.25 MG (50000 UNIT) PO CAPS
50000.0000 [IU] | ORAL_CAPSULE | ORAL | Status: DC
  Filled 2012-03-28 (×2): qty 1

## 2012-03-28 MED ORDER — DEXLANSOPRAZOLE 60 MG PO CPDR
60.0000 mg | DELAYED_RELEASE_CAPSULE | Freq: Two times a day (BID) | ORAL | Status: DC
  Administered 2012-03-28: 60 mg via ORAL
  Filled 2012-03-28 (×7): qty 1

## 2012-03-28 MED ORDER — FUROSEMIDE 10 MG/ML IJ SOLN
40.0000 mg | Freq: Once | INTRAMUSCULAR | Status: AC
  Administered 2012-03-28: 40 mg via INTRAVENOUS
  Filled 2012-03-28: qty 4

## 2012-03-28 MED ORDER — ETOMIDATE 2 MG/ML IV SOLN
INTRAVENOUS | Status: AC
  Administered 2012-03-28: 20 mg
  Filled 2012-03-28: qty 10

## 2012-03-28 MED ORDER — MIDAZOLAM HCL 2 MG/2ML IJ SOLN
1.0000 mg | INTRAMUSCULAR | Status: DC | PRN
  Administered 2012-03-28: 2 mg via INTRAVENOUS
  Administered 2012-03-29: 1 mg via INTRAVENOUS
  Administered 2012-03-29: 2 mg via INTRAVENOUS
  Administered 2012-03-29: 1 mg via INTRAVENOUS
  Administered 2012-03-29: 2 mg via INTRAVENOUS
  Administered 2012-03-29: 1 mg via INTRAVENOUS
  Administered 2012-03-29 (×3): 2 mg via INTRAVENOUS
  Administered 2012-03-29: 1 mg via INTRAVENOUS
  Administered 2012-03-30: 2 mg via INTRAVENOUS
  Filled 2012-03-28 (×9): qty 2

## 2012-03-28 MED ORDER — ASPIRIN EC 325 MG PO TBEC
325.0000 mg | DELAYED_RELEASE_TABLET | Freq: Every day | ORAL | Status: DC
  Filled 2012-03-28: qty 1

## 2012-03-28 NOTE — Procedures (Signed)
Intubation Procedure Note [name] [id number] [birth year]  Procedure: Intubation Indications: Respiratory insufficiency  Procedure Details Consent: Unable to obtain consent because of emergent medical necessity. Time Out: Verified patient identification, verified procedure, site/side was marked, verified correct patient position, special equipment/implants available, medications/allergies/relevent history reviewed, required imaging and test results available.  Performed  Drugs: etomidate 20mg , rocuronium 50mg  DLx1 with MAC and 3; grade 1 view, 7.5 ETT passed between cords under direct visualization; secured at 23cm at teeth, placement confirmed with pos ETT CO2 change, smoke in tube, bilat breath sounds   Evaluation Hemodynamic Status: BP stable throughout; O2 sats: stable throughout Patient's Current Condition: stable Complications: No apparent complications Patient did tolerate procedure well. Chest X-ray ordered to verify placement.  CXR: pending.   Max Fickle 03/28/2012

## 2012-03-28 NOTE — Progress Notes (Signed)
[facility] PCCM  I was paged emergently to the bedside for acute hypoxemic respiratory failure.  Apparently [name] was feeling well until around 2230 this evening when she developed hypertension and hypoxemia.  She decompensated rapidly.  I intubated her, see note.  Filed Vitals:   03/28/12 1900 03/28/12 1923 03/28/12 2000 03/28/12 2100  BP:   128/56 145/65  Pulse: 118  93 100  Temp:  97.8 F (36.6 C)    TempSrc:  Oral    Resp: 22  18 16   Height:      Weight:      SpO2: 90%  95% 93%   Gen: acutely ill appearing, marked respiratory distress  HEENT: NCAT, EOMi, NRB mask in place PULM: Rhonchi, crackles bilaterally CV: Tachy, regular AB: BS+, soft, nontender, no hsm Ext: R leg cool, L warm, well perfused Neuro: Anxious  EKG: no acute ischaemic changes  Impression: 1) Acute respiratory failure likely due to flash pulmonary edema, ddx also includes acute coronary ischaemia 2) s/p aorto-bi-fem  Plan: 1) full vent support, requiring high peep/fiO2 now likely due to flash pulm edema 2) wean peep 3) lasix now 4) nitropaste now 5) abg in one hour 6) cont fentanyl 7) prn versed  CC time 40 minutes.  [name] PCCM Pager: [phone] Cell: [phone] If no response, call [phone]

## 2012-03-28 NOTE — Progress Notes (Signed)
Pt HR-130s with SBP-80s.  Dr. Tyson Alias notified.  Orders received.  Will monitor pt.

## 2012-03-28 NOTE — Progress Notes (Signed)
24ml PCA morphine wasted in sink.  Witnessed by Cato Mulligan RN

## 2012-03-28 NOTE — Progress Notes (Signed)
Name: [name] MRN: [id number] DOB: 02/26/1946    LOS: 2  Referring Provider:  Dr Brantley Fling Reason for Referral:  Post op resp failure  PULMONARY / CRITICAL CARE MEDICINE  HPI:  66 year old h/o multiple vascular bypass procedures, PVC underwent elective Aortobifemoral bypass with 14 x 18 the shield graft with reimplantation of accessory left renal artery and thrombectomy of right femoral to below knee popliteal Gore-Tex graft extension to the peroneal artery with 6 mm Gore-Tex.  The surgery time was prolonged as complicated. Still with some concerns hypoperfused rt foot. Presents with post op resp failure.    Events Since Admission: 9/23- Aorto BiFem, post op vent  Current Status: Sinus tachy, HTNive, edematous.  Tol extubation well.   Vital Signs: Temp:  [98.5 F (36.9 C)-99.5 F (37.5 C)] 98.6 F (37 C) (09/25 0800) Pulse Rate:  [100-133] 116  (09/25 0800) Resp:  [7-28] 17  (09/25 0800) BP: (58-109)/(30-91) 73/62 mmHg (09/25 0700) SpO2:  [89 %-100 %] 92 % (09/25 0400) Arterial Line BP: (93)/(53) 93/53 mmHg (09/24 1000)  Physical Examination: Gen: awake HEENT: NCAT, PERRL,  PULM: CTA B CV: RRR, no mgr, no JVD AB: BS infrequent, midline scar well healed, soft, nontender, no hsm Ext: L leg warm, R cool, mottled no edema, no clubbing, no cyanosis Derm: no rash or skin breakdown Neuro: awake on vent,  follows commands   Active Problems:  CORONARY ATHEROSCLEROSIS NATIVE CORONARY ARTERY  UNSPECIFIED PERIPHERAL VASCULAR DISEASE  Atherosclerotic PVD with intermittent claudication  Respiratory failure, post-operative   ASSESSMENT AND PLAN  PULMONARY  Lab 03/27/12 0344 03/26/12 1801 03/26/12 1511 03/26/12 1209 03/21/12 1443  PHART 7.381 7.335* 7.336* 7.382 7.438  PCO2ART 41.8 46.1* 49.4* 42.6 38.0  PO2ART 75.0* 151.0* 356.0* 200.0* 95.1  HCO3 24.7* 24.4* 26.3* 25.5* 25.2*  O2SAT 94.0 99.0 100.0 100.0 98.3      CXR: less edema, ETT out ETT:  9/23>>>9/24  A:   Post op resp failure>>>failed P:   IS Diurese Titrate oxygen  CARDIOVASCULAR No results found for this basename: TROPONINI:5,LATICACIDVEN:5, O2SATVEN:5,PROBNP:5 in the last 168 hours ECG: 9/23 NSR, no acute ST Seg changes Lines: rt IJ swan 9/23>>>9/24, still with R IJ sleeve>>   A: HTN, sinus tachy S/p  Aortobifem and R popliteal to perineal artery graft 9/23.  Still no perfusion R foot 9/24  P:  Lasix 40mg  x 2 doses Increase beta blocker Monitor Per [facility] Dr Early    RENAL  Lab 03/28/12 0415 03/27/12 0341 03/26/12 1755 03/26/12 1522 03/26/12 1511 03/21/12 1443  NA 140 136 136 137 137 --  K 4.9 3.9 -- -- -- --  CL 108 102 103 -- -- 98  CO2 26 24 24  -- -- 29  BUN 11 11 12  -- -- 16  CREATININE 0.55 0.61 0.66 -- -- 0.80  CALCIUM 8.2* 8.2* 7.6* -- -- 9.6  MG -- 2.5 1.2* -- -- --  PHOS -- -- -- -- -- --   Intake/Output      09/24 0701 - 09/25 0700 09/25 0701 - 09/26 0700   I.V. (mL/kg) 1818.4 (22.7) 87 (1.1)   Blood     NG/GT 30    IV Piggyback 1000    Total Intake(mL/kg) 2848.4 (35.6) 87 (1.1)   Urine (mL/kg/hr) 1135 (0.6) 275   Blood     Total Output 1135 275   Net +1713.4 -188         Foley:  9/23>>> Good UOP 9/25  A:  Hypomagnesemia>>repleted.  No renal issues postop. Good UOP P:   Monitor bmet daily Give lasix  GASTROINTESTINAL  Lab 03/27/12 0341 03/21/12 1443  AST 26 15  ALT 10 9  ALKPHOS 50 71  BILITOT 0.3 0.1*  PROT 5.0* 7.0  ALBUMIN 2.5* 3.5    A: GERD, cannot take /tolerate Protonix.  Can only use dexilant P:   Advance diet per [facility] Ok to use dexilant from home     HEMATOLOGIC  Lab 03/28/12 0415 03/27/12 0341 03/26/12 1755 03/26/12 1522 03/26/12 1511 03/21/12 1443  HGB 7.3* 9.4* 9.9* 9.5* 9.5* --  HCT 22.8* 28.5* 29.1* 28.0* 28.0* --  PLT 162 187 172 -- -- 291  INR -- -- 1.18 -- -- 0.93  APTT -- -- 40* -- -- 30   A:  Anemia P:  tfx trigger for Hgb</= 7.0     INFECTIOUS  Lab 03/28/12 0415 03/27/12 0341 03/26/12 1755 03/21/12  1443  WBC 10.2 9.6 8.9 6.1  PROCALCITON -- -- -- --   Cultures: None  Antibiotics: Peri op cefuoxime  A:  Peri op abx, at risk rt lower ext infection P:   Per Dr Arbie Cookey recs Follow fever curve    ENDOCRINE  Lab 03/28/12 0800 03/28/12 0359 03/28/12 0040 03/27/12 2004 03/27/12 1532  GLUCAP 116* 138* 127* 128* 91   A:  DM 2  Ok control. On januvia at home and actos/met  P:   SSI q4h per protocol  NEUROLOGIC  A:  No neuro deficits. Pain control good.  P:   PCA morphine   BEST PRACTICE / DISPOSITION Level of Care:  ICU Primary Service:  Vasc Consultants:  PCCM Code Status:  Full Diet: clear liquids DVT Px:  GI Px:  ppi Skin Integrity:  normal Social / Family: husband updated at bedside 9/25 AM  CC 30 minutes.  Dorcas Carrow  PCCM [facility]  [phone]  Cell  [phone]  If no response or cell goes to voicemail, call beeper [phone]

## 2012-03-28 NOTE — Progress Notes (Signed)
Subjective: Interval History: none. Comfortable. Alert and oriented. No nausea or vomiting..   Objective: Vital signs in last 24 hours: Temp:  [98 F (36.7 C)-99.5 F (37.5 C)] 98.2 F (36.8 C) (09/25 1230) Pulse Rate:  [109-133] 124  (09/25 1230) Resp:  [9-28] 19  (09/25 1230) BP: (58-175)/(30-99) 170/64 mmHg (09/25 1200) SpO2:  [89 %-99 %] 92 % (09/25 0400)  Intake/Output from previous day: 09/24 0701 - 09/25 0700 In: 2848.4 [I.V.:1818.4; NG/GT:30; IV Piggyback:1000] Out: 1135 [Urine:1135] Intake/Output this shift: Total I/O In: 716 [I.V.:87; Blood:629] Out: 275 [Urine:275]  Abdomen soft nontender incision in her abdomen groin and popliteal space the right her all healing well. 2+ left posterior tibial pulse. No palpable popliteal or pedal pulses on the right. Her foot is cyanotic. She is warm to the level of the cath with no evidence of skin breakdown  Lab Results:  [facility] 03/28/12 0415 03/27/12 0341  WBC 10.2 9.6  HGB 7.3* 9.4*  HCT 22.8* 28.5*  PLT 162 187   BMET  Basename 03/28/12 0415 03/27/12 0341  NA 140 136  K 4.9 3.9  CL 108 102  CO2 26 24  GLUCOSE 146* 209*  BUN 11 11  CREATININE 0.55 0.61  CALCIUM 8.2* 8.2*    Studies/Results: Dg Chest 2 View  03/21/2012  *RADIOLOGY REPORT*  Clinical Data: Preadmission chest radiograph.  No complaints.  CHEST - 2 VIEW  Comparison: None.  Findings: Cardiopericardial silhouette is within normal limits for projection.  Aortic arch atherosclerosis.  Symmetric bilateral pleural apical thickening.  No airspace disease.  No effusion. Small calcified granuloma at the right apex interposed between the right posterior fourth and fifth ribs.  Similar calcified granuloma is present at the left apex.  No definite calcified lymph nodes are identified.  IMPRESSION: No active cardiopulmonary disease.  Old granulomatous disease.  The   Original Report Authenticated By: Andreas Newport, M.D.    Dg Chest Port 1 View  03/28/2012   *RADIOLOGY REPORT*  Clinical Data: Postop.  Atelectasis.  PORTABLE CHEST - 1 VIEW  Comparison: 03/27/2012  Findings: Interval extubation and removal of Swan-Ganz catheter and NG tube.  Improving bibasilar aeration with decreasing atelectasis. Heart is borderline in size.  Diffuse interstitial prominence throughout the lungs, possible mild interstitial edema.  IMPRESSION: Improving bibasilar aeration following extubation.  Continued interstitial prominence may reflect interstitial edema.   Original Report Authenticated By: Cyndie Chime, M.D.    Dg Chest Port 1 View  03/27/2012  *RADIOLOGY REPORT*  Clinical Data: Status post aorto bifemoral bypass and umbilical hernia repair.  PORTABLE CHEST - 1 VIEW  Comparison: 03/26/2012 and 03/21/2012.  Findings: 0649 hours.  The endotracheal tube, nasogastric tube and Swan-Ganz catheter are unchanged.  There is increased left lower lobe atelectasis and a small left pleural effusion.  No pneumothorax or pulmonary edema is seen.  Heart size and mediastinal contours are stable.  Calcifications are noted adjacent to the left shoulder suggesting hydroxyapatite deposition.  IMPRESSION:  1.  Increased left lower lobe atelectasis and small left pleural effusion. 2.  Otherwise stable postoperative chest with stable support system.   Original Report Authenticated By: Gerrianne Scale, M.D.    Dg Chest Portable 1 View  03/26/2012  *RADIOLOGY REPORT*  Clinical Data: Postop central line placement.  PORTABLE CHEST - 1 VIEW  Comparison: 03/21/2012  Findings: A right jugular Swan-Ganz catheter is seen with tip in the proximal right pulmonary artery.  Endotracheal tube and nasogastric tube are seen in appropriate position.  No pneumothorax identified.  Both lungs are clear.  Heart size is stable.  IMPRESSION:  No active lung disease.  No pneumothorax identified.   Original Report Authenticated By: Danae Orleans, M.D.    Dg Abd Portable 1v  03/26/2012  *RADIOLOGY REPORT*  Clinical  Data: 66 year old female status post stent placement.  PORTABLE ABDOMEN - 1 VIEW  Comparison: CT 02/03/1812.  Findings: Portable supine view 1746 hours.  Enteric tube partially visible tip at the level of the gastric body.  Mild motion artifact.  Calcified atherosclerosis, severe in the pelvis and about the proximal lower extremities.  Bilateral common iliac artery stent re-identified.  Right external iliac artery stent re- identified.  Partial visualization of a stent in the proximal right lower extremity.  Paucity of bowel gas, blurred by motion. Questionable gas in the bladder, may reflect recent catheter placement.  IMPRESSION: 1.  Advanced pelvic and proximal femoral atherosclerosis with multiple vascular stent in place. 2.  Suggestion of gas within the bladder, query recent Foley placement/removal. 3.  Motion artifact affecting the visualized bowel gas.   Original Report Authenticated By: Harley Hallmark, M.D.    Anti-infectives: Anti-infectives     Start     Dose/Rate Route Frequency Ordered Stop   03/26/12 1745   cefUROXime (ZINACEF) 1.5 g in dextrose 5 % 50 mL IVPB        1.5 g 100 mL/hr over 30 Minutes Intravenous Every 12 hours 03/26/12 1732 03/27/12 0830   03/26/12 1345   cefUROXime (ZINACEF) 750 mg in dextrose 5 % 50 mL IVPB        750 mg 100 mL/hr over 30 Minutes Intravenous  Once 03/26/12 1342     03/25/12 1452   cefUROXime (ZINACEF) 1.5 g in dextrose 5 % 50 mL IVPB        1.5 g 100 mL/hr over 30 Minutes Intravenous 30 min pre-op 03/25/12 1452 03/26/12 1345          Assessment/Plan: s/p Procedure(s) (LRB) with comments: AORTA BIFEMORAL BYPASS GRAFT (N/A) - Aortic-bifemoral bypass using 14x35mm Hemashield graft .  HERNIA REPAIR UMBILICAL ADULT (N/A) - Removal of Umbilical hernia sac FEMORAL ARTERY EXPLORATION (Right) - with Revision of Popliteal-Peroneal bypass graft using 6mm x 10cm thin wall goretex graft Stable postop day 2. We will begin liquids today and advance her diet as  tolerated. Begin the mobilize the patient. A long discussion with the patient and her husband and son present. Again explained the option for further observed at revascularization of the right leg. Explain that probable need for eventual amputation. We will continue to watch this area demarcated as we progressed from her aortic surgery.   LOS: 2 days   EARLY, TODD 03/28/2012, 1:07 PM

## 2012-03-28 NOTE — Progress Notes (Signed)
eLink Physician-Brief Progress Note Patient Name: [name] DOB: January 24, 1946 MRN: [id number]  Date of Service  03/28/2012   HPI/Events of Note  Pt requests her vit d, no constipation, calcium wnl    eICU Interventions  Assess ion ca , dc if no BM next 24 hrs Add home vit d   Intervention Category Minor Interventions: Routine modifications to care plan (e.g. PRN medications for pain, fever)  Nelda Bucks. 03/28/2012, 6:15 PM

## 2012-03-29 ENCOUNTER — Inpatient Hospital Stay (HOSPITAL_COMMUNITY): Payer: Medicare Other

## 2012-03-29 DIAGNOSIS — I369 Nonrheumatic tricuspid valve disorder, unspecified: Secondary | ICD-10-CM

## 2012-03-29 DIAGNOSIS — J810 Acute pulmonary edema: Secondary | ICD-10-CM

## 2012-03-29 LAB — GLUCOSE, CAPILLARY
Glucose-Capillary: 116 mg/dL — ABNORMAL HIGH (ref 70–99)
Glucose-Capillary: 117 mg/dL — ABNORMAL HIGH (ref 70–99)
Glucose-Capillary: 122 mg/dL — ABNORMAL HIGH (ref 70–99)
Glucose-Capillary: 148 mg/dL — ABNORMAL HIGH (ref 70–99)

## 2012-03-29 LAB — CBC
HCT: 33 % — ABNORMAL LOW (ref 36.0–46.0)
Hemoglobin: 9.7 g/dL — ABNORMAL LOW (ref 12.0–15.0)
Hemoglobin: 10.7 g/dL — ABNORMAL LOW (ref 12.0–15.0)
MCH: 27.5 pg (ref 26.0–34.0)
MCH: 28.2 pg (ref 26.0–34.0)
MCV: 86.7 fL (ref 78.0–100.0)
MCV: 86.8 fL (ref 78.0–100.0)
Platelets: 191 10*3/uL (ref 150–400)
RBC: 3.53 MIL/uL — ABNORMAL LOW (ref 3.87–5.11)
RBC: 3.8 MIL/uL — ABNORMAL LOW (ref 3.87–5.11)

## 2012-03-29 LAB — HEPARIN LEVEL (UNFRACTIONATED): Heparin Unfractionated: <0.1 IU/mL — ABNORMAL LOW (ref 0.30–0.70)

## 2012-03-29 LAB — POCT I-STAT 3, ART BLOOD GAS (G3+)
Acid-base deficit: 1 mmol/L (ref 0.0–2.0)
Bicarbonate: 26.5 mEq/L — ABNORMAL HIGH (ref 20.0–24.0)
Patient temperature: 98.7
Patient temperature: 98.7
TCO2: 29 mmol/L (ref 0–100)
pH, Arterial: 7.219 — ABNORMAL LOW (ref 7.350–7.450)

## 2012-03-29 LAB — TYPE AND SCREEN
ABO/RH(D): O POS
Unit division: 0
Unit division: 0
Unit division: 0

## 2012-03-29 LAB — BASIC METABOLIC PANEL
BUN: 16 mg/dL (ref 6–23)
CO2: 28 mEq/L (ref 19–32)
CO2: 27 mEq/L (ref 19–32)
Chloride: 100 mEq/L (ref 96–112)
Chloride: 99 mEq/L (ref 96–112)
Creatinine, Ser: 0.56 mg/dL (ref 0.50–1.10)
GFR calc non Af Amer: >90 mL/min (ref >90)
Glucose, Bld: 131 mg/dL — ABNORMAL HIGH (ref 70–99)
Potassium: 4.1 mEq/L (ref 3.5–5.1)
Sodium: 137 mEq/L (ref 135–145)

## 2012-03-29 LAB — TRIGLYCERIDES: Triglycerides: 113 mg/dL (ref <150)

## 2012-03-29 MED ORDER — LEVALBUTEROL HCL 0.63 MG/3ML IN NEBU
0.6300 mg | INHALATION_SOLUTION | Freq: Four times a day (QID) | RESPIRATORY_TRACT | Status: DC
  Administered 2012-03-29 – 2012-04-02 (×15): 0.63 mg via RESPIRATORY_TRACT
  Filled 2012-03-29 (×21): qty 3

## 2012-03-29 MED ORDER — POTASSIUM CHLORIDE 10 MEQ/50ML IV SOLN
10.0000 meq | INTRAVENOUS | Status: AC
  Administered 2012-03-29 (×2): 10 meq via INTRAVENOUS
  Filled 2012-03-29: qty 100

## 2012-03-29 MED ORDER — FUROSEMIDE 10 MG/ML IJ SOLN
40.0000 mg | Freq: Four times a day (QID) | INTRAMUSCULAR | Status: AC
  Administered 2012-03-29 (×3): 40 mg via INTRAVENOUS
  Filled 2012-03-29 (×3): qty 4

## 2012-03-29 MED ORDER — HEPARIN (PORCINE) IN NACL 100-0.45 UNIT/ML-% IJ SOLN
1750.0000 [IU]/h | INTRAMUSCULAR | Status: DC
  Administered 2012-03-29: 1150 [IU]/h via INTRAVENOUS
  Administered 2012-03-29: 1000 [IU]/h via INTRAVENOUS
  Administered 2012-03-31: 1650 [IU]/h via INTRAVENOUS
  Administered 2012-04-01: 1800 [IU]/h via INTRAVENOUS
  Administered 2012-04-01: 1650 [IU]/h via INTRAVENOUS
  Filled 2012-03-29 (×12): qty 250

## 2012-03-29 MED ORDER — FUROSEMIDE 10 MG/ML IJ SOLN
40.0000 mg | Freq: Once | INTRAMUSCULAR | Status: AC
  Administered 2012-03-29: 40 mg via INTRAVENOUS
  Filled 2012-03-29: qty 4

## 2012-03-29 MED ORDER — METOPROLOL TARTRATE 50 MG PO TABS
50.0000 mg | ORAL_TABLET | Freq: Two times a day (BID) | ORAL | Status: DC
  Administered 2012-03-29 (×2): 50 mg via ORAL
  Filled 2012-03-29 (×4): qty 1

## 2012-03-29 MED ORDER — POTASSIUM CHLORIDE 10 MEQ/100ML IV SOLN: 10.0000 meq | INTRAVENOUS | Status: DC

## 2012-03-29 MED ORDER — FAMOTIDINE IN NACL 20-0.9 MG/50ML-% IV SOLN
20.0000 mg | INTRAVENOUS | Status: DC
  Administered 2012-03-29 – 2012-04-01 (×4): 20 mg via INTRAVENOUS
  Filled 2012-03-29 (×3): qty 50

## 2012-03-29 MED ORDER — CHLORHEXIDINE GLUCONATE 0.12 % MT SOLN
OROMUCOSAL | Status: AC
  Administered 2012-03-29: 15 mL
  Filled 2012-03-29: qty 15

## 2012-03-29 MED ORDER — ASPIRIN 325 MG PO TABS
325.0000 mg | ORAL_TABLET | Freq: Every day | ORAL | Status: DC
  Administered 2012-03-29 – 2012-04-04 (×6): 325 mg via ORAL
  Filled 2012-03-29 (×8): qty 1

## 2012-03-29 MED FILL — Heparin Sodium (Porcine) Inj 1000 Unit/ML: INTRAMUSCULAR | Qty: 30 | Status: AC

## 2012-03-29 MED FILL — Sodium Chloride IV Soln 0.9%: INTRAVENOUS | Qty: 2000 | Status: AC

## 2012-03-29 NOTE — Progress Notes (Signed)
Pt sats began to drop to 89%, oxygen per nasal cannula increased from 2 to 5 liters per minute.  Pt rapidly (5-7mins) became more short of breath, lips became cyanotic and patient became diaphoretic, she became tachycardic (rate 120s) and her bp was elevated into the 170-180 range systolic.  At this time I called the E-MD (Dr. Bary Richard) and expressed my concern that [name] was in acute respiratory distress and would need prompt intervention. Within 5 minutes Dr. Kendrick Fries was at the bedside preparing to intubate.  Throughout this entire course Mrs. Persky remained conscious and oriented, until sedative medications were administered just prior to intubation.  Post intubation Mrs. Lariviere's bp and heart rate stabilized. ECG was performed as well as cardiac enzymes.  Will continue to closely monitor.

## 2012-03-29 NOTE — Progress Notes (Signed)
eLink Physician-Brief Progress Note Patient Name: [name] DOB: April 14, 1946 MRN: [id number]  Date of Service  03/29/2012   HPI/Events of Note   Pos trops  eICU Interventions  Heparin IV Cards input - called    Intervention Category Intermediate Interventions: Diagnostic test evaluation  Raniya Golembeski V. 03/29/2012, 1:13 AM

## 2012-03-29 NOTE — Progress Notes (Signed)
INITIAL ADULT NUTRITION ASSESSMENT Date: 03/29/2012   Time: 1:08 PM  INTERVENTION:  If EN warranted, recommend initiation of Jevity 1.2 formula at 15 ml/hr and increase by 10 ml every 4 hours to goal rate of 45 ml/hr with Prostat liquid protein 30 ml twice daily via tube to provide 1496 total kcals, 90 gm protein, 872 ml of free water  RD to follow for nutrition care plan  Reason for Assessment: VDRF  ASSESSMENT: Female 66 y.o.  Dx: bilateral lower extremity arterial insufficiency  Hx:  Past Medical History  Diagnosis Date  . Hypertension     Unspecified  . Hyperlipidemia     Mixed  . Tobacco abuse     Remote  . Loose, teeth     has loose bridge and two loose teeth holding it  . PONV (postoperative nausea and vomiting)   . Type II diabetes mellitus   . GERD (gastroesophageal reflux disease)   . Anxiety   . Peripheral vascular disease     unspecified, a. s/p L CEA b. s/p B fem-pop bypass  . Raynaud disease   . Stones in the urinary tract   . UTI (urinary tract infection)   . Anemia     hx  . Coronary artery disease     a. s/p NSTEMI 02/2009 - PCI LCX with Xience DES. Otherwise branch vessel and Dist. RCA dzs. NL EF.     Past Surgical History  Procedure Date  . Vesicovaginal fistula closure w/ tah   . Colonoscopy 11/2010  . Upper gastrointestinal endoscopy 11/2010  . Femoral artery - popliteal artery bypass graft     left  . Abdominal hysterectomy     complete  . Femoral-popliteal bypass graft 08/22/2011    Procedure: BYPASS GRAFT FEMORAL-POPLITEAL ARTERY;  Surgeon: Gretta Began, MD;  Location: [facility] OR;  Service: Vascular;  Laterality: Right;  Thrombectomy and Revision using 7mm x 10cm stretch goretex graft  . Intraoperative arteriogram 08/22/2011    Procedure: INTRA OPERATIVE ARTERIOGRAM;  Surgeon: Gretta Began, MD;  Location: [facility] OR;  Service: Vascular;  Laterality: Right;  to lower leg  . Multiple tooth extractions 08-29-2011    5 teeth extracted   . Pci 01/17/12    RLE    . Breast lumpectomy     right  . Tonsillectomy and adenoidectomy   . Coronary angioplasty with stent placement   . Carotid endarterectomy ~ 2005    left  . Aorta - bilateral femoral artery bypass graft 03/26/2012    Procedure: AORTA BIFEMORAL BYPASS GRAFT;  Surgeon: Larina Earthly, MD;  Location: [facility] OR;  Service: Vascular;  Laterality: N/A;  Aortic-bifemoral bypass using 14x47mm Hemashield graft .   . Umbilical hernia repair 03/26/2012    Procedure: HERNIA REPAIR UMBILICAL ADULT;  Surgeon: Larina Earthly, MD;  Location: [facility] OR;  Service: Vascular;  Laterality: N/A;  Removal of Umbilical hernia sac  . Femoral artery exploration 03/26/2012    Procedure: FEMORAL ARTERY EXPLORATION;  Surgeon: Larina Earthly, MD;  Location: [facility] OR;  Service: Vascular;  Laterality: Right;  with Revision of Popliteal-Peroneal bypass graft using 6mm x 10cm thin wall goretex graft    Related Meds:     . aspirin  325 mg Oral Daily  . chlorhexidine      . chlorhexidine      . etomidate      . famotidine (PEPCID) IV  20 mg Intravenous Q24H  . furosemide  40 mg Intravenous Once  . furosemide  40 mg Intravenous Q6H  . insulin aspart  0-9 Units Subcutaneous Q4H  . levalbuterol  0.63 mg Nebulization Q6H  . metoprolol tartrate  50 mg Oral BID  . rocuronium  50 mg Intravenous Once  . Vitamin D (Ergocalciferol)  50,000 Units Oral Q7 days  . DISCONTD: aspirin EC  325 mg Oral Daily  . DISCONTD: cefUROXime (ZINACEF)  IV  750 mg Intravenous Once  . DISCONTD: chlorhexidine  15 mL Mouth/Throat BID  . DISCONTD: Cholecalciferol  25,000 Units Oral 3 times weekly  . DISCONTD: dexlansoprazole  60 mg Oral BID  . DISCONTD: metoprolol tartrate  25 mg Oral BID  . DISCONTD: metoprolol tartrate  25 mg Oral BID  . DISCONTD: morphine   Intravenous Q4H    Ht: 5\' 4"  (162.6 cm)  Wt: 176 lb 5.9 oz (80 kg)  Ideal Wt: 54.5 kg % Ideal Wt: 147%  Usual Wt: 162 lb -- per office visit records % Usual Wt: 110%  Body mass index is 30.27  kg/(m^2). -- falsely high, usual BMI = 27.1  Food/Nutrition Related Hx: no triggers per admission nutrition screen  Labs:  CMP     Component Value Date/Time   NA 137 03/29/2012 0522   K 4.1 03/29/2012 0522   CL 100 03/29/2012 0522   CO2 28 03/29/2012 0522   GLUCOSE 131* 03/29/2012 0522   BUN 15 03/29/2012 0522   CREATININE 0.61 03/29/2012 0522   CALCIUM 8.6 03/29/2012 0522   PROT 5.0* 03/27/2012 0341   ALBUMIN 2.5* 03/27/2012 0341   AST 26 03/27/2012 0341   ALT 10 03/27/2012 0341   ALKPHOS 50 03/27/2012 0341   BILITOT 0.3 03/27/2012 0341   GFRNONAA >90 03/29/2012 0522   GFRAA >90 03/29/2012 0522    Magnesium  Date Value Range Status  03/27/2012 2.5  1.5 - 2.5 mg/dL Final     Intake/Output Summary (Last 24 hours) at 03/29/12 1310 Last data filed at 03/29/12 1200  Gross per 24 hour  Intake  956.5 ml  Output   2030 ml  Net -1073.5 ml    CBG (last 3)   Basename 03/29/12 1154 03/29/12 0743 03/29/12 0421  GLUCAP 111* 116* 117*    Diet Order: NPO  Supplements/Tube Feeding: N/A  IVF:    sodium chloride Last Rate: 20 mL/hr at 03/29/12 1200  fentaNYL infusion INTRAVENOUS Last Rate: 150 mcg/hr (03/29/12 1200)  heparin Last Rate: 1,150 Units/hr (03/29/12 1200)    Estimated Nutritional Needs:   Kcal: 1500-1600 Protein: 85-95 gm Fluid: 1.5-1.6 L  Patient is currently intubated on ventilator support MV: 8.3 Temp: 37.7  Patient s/p aortobifemoral bypass, thrombectomy and umbilical hernia repair 9/23; extubated post-op 9/24 and re-intubated 9/25; NGT in place; no renal issues post-op; good UOP; diffuse edema to extremities.   NUTRITION DIAGNOSIS: -Inadequate oral intake (NI-2.1).  Status: Ongoing  RELATED TO: inability to eat  AS EVIDENCE BY: NPO status  MONITORING/EVALUATION(Goals): Goal: Initiate nutrition support within 48 hours if prolonged intubation expected Monitor: Nutrition support initiation, respiratory status, weight, labs, I/O's  EDUCATION NEEDS: -No  education needs identified at this time  Dietitian #: [phone]  DOCUMENTATION CODES Per approved criteria  -Not Applicable    Alger Memos 03/29/2012, 1:08 PM

## 2012-03-29 NOTE — Progress Notes (Addendum)
ANTICOAGULATION CONSULT NOTE - Follow-Up Consult  Pharmacy Consult for heparin Indication: chest pain/ACS  Allergies  Allergen Reactions  . Codeine Nausea And Vomiting  . Erythromycin Nausea And Vomiting  . Hydrocodone-Acetaminophen Nausea And Vomiting  . Hydromorphone Nausea And Vomiting  . Propoxyphene Hcl Nausea And Vomiting  . Statins Other (See Comments)    Leg cramps  . Acetaminophen Other (See Comments)    Does not tolerate well, nausea  . Eggs Or Egg-Derived Products     "pt does not eat"  . Ibuprofen Other (See Comments)    Does not tolerate well  . Shellfish-Derived Products     "pt does not eat"    Patient Measurements: Height: 5\' 4"  (162.6 cm) Weight: 176 lb 5.9 oz (80 kg) IBW/kg (Calculated) : 54.7  Heparin Dosing Weight: 73kg  Vital Signs: Temp: 100.4 F (38 C) (09/26 1543) Temp src: Axillary (09/26 1543) BP: 165/67 mmHg (09/26 1800) Pulse Rate: 102  (09/26 1800)  Labs:  Basename 03/29/12 1800 03/29/12 1130 03/29/12 0800 03/29/12 0524 03/29/12 0522 03/28/12 2300 03/28/12 0415  HGB -- -- 10.7* -- 9.7* -- --  HCT -- -- 33.0* -- 30.6* -- 22.8*  PLT -- -- 191 -- 187 -- 162  APTT -- -- -- -- -- -- --  LABPROT -- -- -- -- -- -- --  INR -- -- -- -- -- -- --  HEPARINUNFRC -- -- 0.17* -- -- -- --  CREATININE 0.56 -- -- -- 0.61 -- 0.55  CKTOTAL -- -- -- -- -- -- --  CKMB -- -- -- -- -- -- --  TROPONINI -- 1.66* -- 2.21* -- 2.28* --    Estimated Creatinine Clearance: 69.8 ml/min (by C-G formula based on Cr of 0.56).  Heparin level reported as <0.10 with heparin at 1150 units/hr.   Assessment: 66yo female s/p aortic bifemoral bypass 9/23. Intubated due to acute hypoxemic respiratory with rapid decompensation. Pt now on heparin for elevated troponin.   Heparin level subtherapeutic with heparin at 1150 units/hr. Noted plan to keep at lower end of goal for now given recent vascular surgery. CBC stable. No bleeding noted.  Per RN, no trouble with infusion.      Goal of Therapy:  Heparin level 0.3-0.5 units/ml Monitor platelets by anticoagulation protocol: Yes   Plan:  1) Increase heparin to 1400 units/hr (~20 units/kg/hr) 2) Check daily heparin level and CBC 3) check 8h Heparin level after rate change  Vyncent Overby L. Illene Bolus, PharmD, BCPS Clinical Pharmacist Pager: [phone] Pharmacy: [phone] 03/29/2012 7:07 PM

## 2012-03-29 NOTE — Progress Notes (Signed)
  Echocardiogram 2D Echocardiogram has been performed.  Doyle Kunath 03/29/2012, 10:47 AM

## 2012-03-29 NOTE — Progress Notes (Signed)
Physical Therapy Discharge Patient Details Name: [name] MRN: [id number] DOB: 23-Apr-1946 Today's Date: 03/29/2012 Time:  -     Patient discharged from PT services secondary to medical decline - will need to re-order PT to resume therapy services.  Please see latest therapy progress note for current level of functioning and progress toward goals.    Progress and discharge plan discussed with patient and/or caregiver: Patient unable to participate in discharge planning and no caregivers available  GP   [facility] PT [phone]   [facility] 03/29/2012, 8:20 AM

## 2012-03-29 NOTE — Clinical Documentation Improvement (Signed)
Anemia Blood Loss Clarification  THIS DOCUMENT IS NOT A PERMANENT PART OF THE MEDICAL RECORD          03/29/12  Dr. Arbie Cookey and/or Associates,  In an effort to better capture your patient's severity of illness, reflect appropriate length of stay and utilization of resources, a review of the patient medical record has revealed the following indicators:  Per Dictated Op Note and Anesthesia Record: Intraoperative Fluids:  Lactated Ringers   6000 mls                                        Normal Saline          500 mls                                        PRBCs                      700 mls                                        Cell Saver                300 mls                                        Albumin                    750 mls                                        Total                        8250 mls  EBL     1000 mls Urine   1100 mls    H&H Results Preop H&H 03/21/12 at 1443pm -  10.6/33.4 Intraop H&H 03/26/12 at 1123am -  7.1/21.0 Postop H&H 03/28/12 at 0415am - 7.3/22.8   Based on your clinical judgment, please document in the progress notes and discharge summary if a condition below provides greater specificity regarding the patient's CBC results:    - Expected Acute Blood Loss Anemia   - Precipitous Drop in Hematocrit   - Combined Expected Acute Blood Loss Anemia and Precipitous Drop in Hematocrit   - Other Condition   - Unable to Clinically Determine    In responding to this query please exercise your independent judgment.    The fact that a query is asked, does not imply that any particular answer is desired or expected.   Reviewed: 04/02/12 - query not addressed - [email or web address].  Mathis Dad RN  Thank You,  Jerral Ralph  RN BSN CCDS Certified Clinical Documentation Specialist: Cell   [phone]  [facility] [employer] [facility]  RESPOND TO THE THIS QUERY, FOLLOW THE INSTRUCTIONS BELOW:  1. If needed, update documentation for the  patient's encounter via the notes activity.  2. Access this query again and click edit on the  In [employer].  3. After updating, or not, click F2 to complete all highlighted (required) fields concerning your review. Select "additional documentation in the medical record" OR "no additional documentation provided".  4. Click Sign note button.  5. The deficiency will fall out of your In Basket *Please let us know if you are not able to complete this workflow by phone or e-mail (listed below).

## 2012-03-29 NOTE — Progress Notes (Signed)
Name: [name] MRN: [id number] DOB: 1946/02/19    LOS: 3  Referring Provider:  Dr Brantley Fling Reason for Referral:  Post op resp failure  PULMONARY / CRITICAL CARE MEDICINE  HPI:  66 year old h/o multiple vascular bypass procedures, PVC underwent elective Aortobifemoral bypass with 14 x 18 the shield graft with reimplantation of accessory left renal artery and thrombectomy of right femoral to below knee popliteal Gore-Tex graft extension to the peroneal artery with 6 mm Gore-Tex.  The surgery time was prolonged as complicated. Still with some concerns hypoperfused rt foot. Presents with post op resp failure.    Events Since Admission: 9/23- Aorto BiFem, post op vent  Current Status: Required reintubation for acute pulmonary edema 11PM 9/25 Awake and alert this am on vent  Vital Signs: Temp:  [97.8 F (36.6 C)-99.9 F (37.7 C)] 99.9 F (37.7 C) (09/26 0751) Pulse Rate:  [83-124] 98  (09/26 0737) Resp:  [0-26] 24  (09/26 0737) BP: (117-177)/(45-138) 135/62 mmHg (09/26 0737) SpO2:  [87 %-100 %] 100 % (09/26 0737) FiO2 (%):  [60 %] 60 % (09/26 0737)  Physical Examination: Gen: awake, ETT in place  HEENT: NCAT, PERRL,  PULM: rales CV: RRR, no mgr, no JVD AB: BS infrequent, midline scar well healed, soft, nontender, no hsm Ext: L leg warm, R cool, mottled no edema, no clubbing, no cyanosis Derm: no rash or skin breakdown Neuro: awake on vent,  follows commands   Active Problems:  CORONARY ATHEROSCLEROSIS NATIVE CORONARY ARTERY  UNSPECIFIED PERIPHERAL VASCULAR DISEASE  Atherosclerotic PVD with intermittent claudication  Respiratory failure, post-operative  Acute respiratory failure with hypoxia  Pulmonary edema, acute   ASSESSMENT AND PLAN  PULMONARY  Lab 03/29/12 0049 03/29/12 0029 03/27/12 0344 03/26/12 1801 03/26/12 1511  PHART 7.264* 7.219* 7.381 7.335* 7.336*  PCO2ART 58.5* 67.1* 41.8 46.1* 49.4*  PO2ART 76.0* 24.0* 75.0* 151.0* 356.0*  HCO3 26.5* 27.3*  24.7* 24.4* 26.3*  O2SAT 92.0 32.0 94.0 99.0 100.0    Vent Mode:  [-] PRVC FiO2 (%):  [60 %] 60 % Set Rate:  [18 bmp-24 bmp] 24 bmp Vt Set:  [330 mL-400 mL] 330 mL PEEP:  [8 cmH20-12 cmH20] 12 cmH20 Plateau Pressure:  [24 cmH20-32 cmH20] 24 cmH20 CXR: pulmonary edema ETT:  9/23>>>9/24 ETT: 9/25>>  A:  Post op resp failure, failed extubation d/t acute pulm edema P:   Full vent Diurese further    CARDIOVASCULAR  Lab 03/29/12 0524 03/28/12 2300  TROPONINI 2.21* 2.28*  LATICACIDVEN -- --  PROBNP -- --   ECG: T wave changes inferior, anterior leads Lines: rt IJ swan 9/23>>>9/24, still with R IJ sleeve>>   A: HTN, sinus tachy.  NONSTEMI post op S/p  Aortobifem and R popliteal to perineal artery graft 9/23.  Still no perfusion R foot 9/24  P:  Lasix 40mg  x 3 doses Increase beta blocker to 50mg  bid Cardiology f/u Check echo   RENAL  Lab 03/29/12 0522 03/28/12 0415 03/27/12 0341 03/26/12 1755 03/26/12 1522  NA 137 140 136 136 137  K 4.1 4.9 -- -- --  CL 100 108 102 103 --  CO2 28 26 24 24  --  BUN 15 11 11 12  --  CREATININE 0.61 0.55 0.61 0.66 --  CALCIUM 8.6 8.2* 8.2* 7.6* --  MG -- -- 2.5 1.2* --  PHOS -- -- -- -- --   Intake/Output      09/25 0701 - 09/26 0700 09/26 0701 - 09/27 0700   P.O. 600  I.V. (mL/kg) 645 (8.1)    Blood 629    NG/GT     IV Piggyback     Total Intake(mL/kg) 1874 (23.4)    Urine (mL/kg/hr) 3105 (1.6)    Total Output 3105    Net -1231          Foley:  9/23>>> Good UOP 9/25  A:  Hypomagnesemia>>repleted. No renal issues postop. Good UOP P:   Monitor bmet daily Give more  lasix  GASTROINTESTINAL  Lab 03/27/12 0341  AST 26  ALT 10  ALKPHOS 50  BILITOT 0.3  PROT 5.0*  ALBUMIN 2.5*    A: GERD, cannot take /tolerate Protonix.  Can only use dexilant P:   IV pepcid NPO    HEMATOLOGIC  Lab 03/29/12 0522 03/28/12 0415 03/27/12 0341 03/26/12 1755 03/26/12 1522  HGB 9.7* 7.3* 9.4* 9.9* 9.5*  HCT 30.6* 22.8* 28.5*  29.1* 28.0*  PLT 187 162 187 172 --  INR -- -- -- 1.18 --  APTT -- -- -- 40* --   A:  Anemia P:  tfx trigger for Hgb</= 7.0     INFECTIOUS  Lab 03/29/12 0522 03/28/12 0415 03/27/12 0341 03/26/12 1755  WBC 10.7* 10.2 9.6 8.9  PROCALCITON -- -- -- --   Cultures: None  Antibiotics: Peri op cefuoxime  A:  Peri op abx, at risk rt lower ext infection P:   Per Dr Arbie Cookey recs Follow fever curve    ENDOCRINE  Lab 03/29/12 0421 03/28/12 2348 03/28/12 1924 03/28/12 1615 03/28/12 1204  GLUCAP 117* 148* 128* 152* 142*   A:  DM 2  Ok control. On januvia at home and actos/met  P:   SSI q4h per protocol  NEUROLOGIC  A:  No neuro deficits. Pain control good.  P:   Sedation protocol  BEST PRACTICE / DISPOSITION Level of Care:  ICU Primary Service:  Vasc Consultants:  PCCM Code Status:  Full Diet: NPO DVT Px:  GI Px:  IV H2, cannot take protonix Skin Integrity:  normal Social / Family: husband updated at bedside 9/26 AM  CC 30 minutes.  Dorcas Carrow  PCCM [facility] Beeper  [phone]  Cell  [phone]  If no response or cell goes to voicemail, call beeper [phone]

## 2012-03-29 NOTE — Progress Notes (Signed)
ANTICOAGULATION CONSULT NOTE - Initial Consult  Pharmacy Consult for heparin Indication: chest pain/ACS  Allergies  Allergen Reactions  . Codeine Nausea And Vomiting  . Erythromycin Nausea And Vomiting  . Hydrocodone-Acetaminophen Nausea And Vomiting  . Hydromorphone Nausea And Vomiting  . Propoxyphene Hcl Nausea And Vomiting  . Statins Other (See Comments)    Leg cramps  . Acetaminophen Other (See Comments)    Does not tolerate well, nausea  . Eggs Or Egg-Derived Products     "pt does not eat"  . Ibuprofen Other (See Comments)    Does not tolerate well  . Shellfish-Derived Products     "pt does not eat"    Patient Measurements: Height: 5\' 4"  (162.6 cm) Weight: 176 lb 5.9 oz (80 kg) IBW/kg (Calculated) : 54.7  Heparin Dosing Weight: 73kg  Vital Signs: Temp: 98.7 F (37.1 C) (09/26 0000) Temp src: Oral (09/26 0000) BP: 159/87 mmHg (09/25 2315) Pulse Rate: 111  (09/25 2315)  Labs:  Basename 03/28/12 2300 03/28/12 0415 03/27/12 0341 03/26/12 1755  HGB -- 7.3* 9.4* --  HCT -- 22.8* 28.5* 29.1*  PLT -- 162 187 172  APTT -- -- -- 40*  LABPROT -- -- -- 14.8  INR -- -- -- 1.18  HEPARINUNFRC -- -- -- --  CREATININE -- 0.55 0.61 0.66  CKTOTAL -- -- -- --  CKMB -- -- -- --  TROPONINI 2.28* -- -- --    Estimated Creatinine Clearance: 69.8 ml/min (by C-G formula based on Cr of 0.55).   Medical History: Past Medical History  Diagnosis Date  . Hypertension     Unspecified  . Hyperlipidemia     Mixed  . Tobacco abuse     Remote  . Loose, teeth     has loose bridge and two loose teeth holding it  . PONV (postoperative nausea and vomiting)   . Type II diabetes mellitus   . GERD (gastroesophageal reflux disease)   . Anxiety   . Peripheral vascular disease     unspecified, a. s/p L CEA b. s/p B fem-pop bypass  . Raynaud disease   . Stones in the urinary tract   . UTI (urinary tract infection)   . Anemia     hx  . Coronary artery disease     a. s/p NSTEMI  02/2009 - PCI LCX with Xience DES. Otherwise branch vessel and Dist. RCA dzs. NL EF.     Medications:  Prescriptions prior to admission  Medication Sig Dispense Refill  . ALPRAZolam (XANAX) 0.25 MG tablet Take 0.25 mg by mouth daily.       Marland Kitchen aspirin 325 MG EC tablet Take 325 mg by mouth daily.      Marland Kitchen aspirin 81 MG tablet Take 81 mg by mouth daily.       . calcium carbonate (TUMS) 500 MG chewable tablet Chew 1-4 tablets by mouth daily as needed. For acid reflux. Pt can take 1 to 4 tabs as needed for reflux      . Cholecalciferol 25000 UNITS CAPS Take 25,000 Units by mouth 3 (three) times a week. Monday, Wednesday, friday      . clopidogrel (PLAVIX) 75 MG tablet Take 1 tablet (75 mg total) by mouth daily.  30 tablet  9  . dexlansoprazole (DEXILANT) 60 MG capsule Take 60 mg by mouth 2 (two) times daily.      Marland Kitchen glyBURIDE (DIABETA) 2.5 MG tablet Take 2.5 mg by mouth every morning.       Marland Kitchen  hydrochlorothiazide 25 MG tablet Take 25 mg by mouth daily.       . iron polysaccharides (NIFEREX) 150 MG capsule Take 150 mg by mouth 2 (two) times daily.      . isosorbide mononitrate (IMDUR) 30 MG 24 hr tablet Take 15 mg by mouth 2 (two) times daily.      . metoprolol succinate (TOPROL-XL) 25 MG 24 hr tablet TAKE 1 TABLET EVERY DAY  30 tablet  1  . omega-3 acid ethyl esters (LOVAZA) 1 G capsule Take 2 g by mouth 2 (two) times daily.       . pioglitazone-metformin (ACTOPLUS MET) 15-500 MG per tablet Take 1 tablet by mouth daily. Brand name only-does not work if components are given separately per patient      . Probiotic Product (ACIDOPHILUS PEARLS PO) Take 1 capsule by mouth daily.      . prochlorperazine (COMPAZINE) 10 MG tablet Take 10 mg by mouth every 6 (six) hours as needed. For nausea      . ramipril (ALTACE) 1.25 MG capsule Take 1.25 mg by mouth daily.      . sitaGLIPtan (JANUVIA) 100 MG tablet Take 100 mg by mouth at bedtime.       . meperidine (DEMEROL) 50 MG tablet Take 25-50 mg by mouth every 4  (four) hours as needed. For pain      . nitroGLYCERIN (NITROSTAT) 0.4 MG SL tablet Place 0.4 mg under the tongue every 5 (five) minutes as needed. For chest pain       Scheduled:    . aspirin  325 mg Oral Daily  . bisacodyl  10 mg Rectal Once  . cefUROXime (ZINACEF)  IV  750 mg Intravenous Once  . chlorhexidine      . dexlansoprazole  60 mg Oral BID  . etomidate      . furosemide  40 mg Intravenous Once  . furosemide  40 mg Intravenous Once  . insulin aspart  0-9 Units Subcutaneous Q4H  . metoprolol tartrate  25 mg Oral BID  . morphine   Intravenous Q4H  . rocuronium  50 mg Intravenous Once  . Vitamin D (Ergocalciferol)  50,000 Units Oral Q7 days  . DISCONTD: aspirin EC  325 mg Oral Daily  . DISCONTD: chlorhexidine  15 mL Mouth/Throat BID  . DISCONTD: Cholecalciferol  25,000 Units Oral 3 times weekly  . DISCONTD: metoprolol  2.5 mg Intravenous Q6H  . DISCONTD: metoprolol tartrate  25 mg Oral BID  . DISCONTD: pantoprazole (PROTONIX) IV  40 mg Intravenous Q24H   Infusions:    . sodium chloride 20 mL/hr at 03/28/12 2300  . fentaNYL infusion INTRAVENOUS 50 mcg/hr (03/29/12 0014)  . heparin    . DISCONTD: 0.9 % NaCl with KCl 20 mEq / L 75 mL/hr at 03/28/12 0700    Assessment: 66yo female two days s/p aortic bifemoral bypass intubated due to acute hypoxemic respiratory with rapid decompensation, CE drawn subsequently and troponin found to be elevated, to begin heparin; will keep at lower end of goal for now given recent vascular surgery and progress cautiously.  Goal of Therapy:  Heparin level 0.3-0.5 units/ml Monitor platelets by anticoagulation protocol: Yes   Plan:  Will begin heparin gtt at 1000 units/hr without bolus and monitor heparin levels and CBC.  Colleen Can PharmD BCPS 03/29/2012,1:32 AM

## 2012-03-29 NOTE — Progress Notes (Signed)
ANTICOAGULATION CONSULT NOTE - Follow-Up Consult  Pharmacy Consult for heparin Indication: chest pain/ACS  Allergies  Allergen Reactions  . Codeine Nausea And Vomiting  . Erythromycin Nausea And Vomiting  . Hydrocodone-Acetaminophen Nausea And Vomiting  . Hydromorphone Nausea And Vomiting  . Propoxyphene Hcl Nausea And Vomiting  . Statins Other (See Comments)    Leg cramps  . Acetaminophen Other (See Comments)    Does not tolerate well, nausea  . Eggs Or Egg-Derived Products     "pt does not eat"  . Ibuprofen Other (See Comments)    Does not tolerate well  . Shellfish-Derived Products     "pt does not eat"    Patient Measurements: Height: 5\' 4"  (162.6 cm) Weight: 176 lb 5.9 oz (80 kg) IBW/kg (Calculated) : 54.7  Heparin Dosing Weight: 73kg  Vital Signs: Temp: 99.9 F (37.7 C) (09/26 0751) Temp src: Axillary (09/26 0751) BP: 142/60 mmHg (09/26 0928) Pulse Rate: 100  (09/26 0928)  Labs:  Basename 03/29/12 0800 03/29/12 0524 03/29/12 0522 03/28/12 2300 03/28/12 0415 03/27/12 0341 03/26/12 1755  HGB 10.7* -- 9.7* -- -- -- --  HCT 33.0* -- 30.6* -- 22.8* -- --  PLT 191 -- 187 -- 162 -- --  APTT -- -- -- -- -- -- 40*  LABPROT -- -- -- -- -- -- 14.8  INR -- -- -- -- -- -- 1.18  HEPARINUNFRC 0.17* -- -- -- -- -- --  CREATININE -- -- 0.61 -- 0.55 0.61 --  CKTOTAL -- -- -- -- -- -- --  CKMB -- -- -- -- -- -- --  TROPONINI -- 2.21* -- 2.28* -- -- --    Estimated Creatinine Clearance: 69.8 ml/min (by C-G formula based on Cr of 0.61).  Assessment: 66yo female s/p aortic bifemoral bypass 9/23. Intubated due to acute hypoxemic respiratory with rapid decompensation. Pt now on heparin for elevated troponin. Heparin level 0.17 (subtherapeutic) on 1000 units/hr. Noted plan to keep at lower end of goal for now given recent vascular surgery. CBC stable. No bleeding noted.  Goal of Therapy:  Heparin level 0.3-0.5 units/ml Monitor platelets by anticoagulation protocol: Yes     Plan:  1) Increase heparin to 1150 units/hr. 2) Check 6 hour heparin level  Christoper Fabian, PharmD, BCPS Clinical pharmacist, pager [phone] 03/29/2012,11:30 AM

## 2012-03-29 NOTE — Consult Note (Signed)
Reason for Consult: Elevated Troponin/NSTEMI Referring Physician: Dr. Mabeline Caras Higley is an 66 y.o. female.  HPI: [name] is a 66 yo woman with PMH of HTN, HLD, T2DM, prior tobacco use, CAD s/p NSTEMI and PCI to LCx in 2010, last known EF 45-50%, prior Left CEA with recent aortobifemoral bypass, reimplantation of accessory left renal artery, thrombectomy of RFA to below knee popliteal gore-tex graft extension to peroneal artery who has had continued RLE ischemia who had flash pulmonary edema late yesterday evening requiring emergent reintubation. Cardiology consulted for positive troponin.  On my evaluation, Mrs. [name] was comfortable and alert but intubated and her husband was with her. We spoke about the overnight events, the positive troponin and likely NSTEMI to be evaluated in the context of her ongoing stress from peripheral vascular disease and ongoing right foot ischemia. She denied chest pain but she is intubated - per notes and nursing, she had no chest pain around her reintubation/hypoxic event. She had her plavix stopped before this procedure (last PCI/DES 2010 for LCx). EKGs reviewed with some ischemic changes.    Past Medical History  Diagnosis Date  . Hypertension     Unspecified  . Hyperlipidemia     Mixed  . Tobacco abuse     Remote  . Loose, teeth     has loose bridge and two loose teeth holding it  . PONV (postoperative nausea and vomiting)   . Type II diabetes mellitus   . GERD (gastroesophageal reflux disease)   . Anxiety   . Peripheral vascular disease     unspecified, a. s/p L CEA b. s/p B fem-pop bypass  . Raynaud disease   . Stones in the urinary tract   . UTI (urinary tract infection)   . Anemia     hx  . Coronary artery disease     a. s/p NSTEMI 02/2009 - PCI LCX with Xience DES. Otherwise branch vessel and Dist. RCA dzs. NL EF.     Past Surgical History  Procedure Date  . Vesicovaginal fistula closure w/ tah   . Colonoscopy 11/2010  . Upper  gastrointestinal endoscopy 11/2010  . Femoral artery - popliteal artery bypass graft     left  . Abdominal hysterectomy     complete  . Femoral-popliteal bypass graft 08/22/2011    Procedure: BYPASS GRAFT FEMORAL-POPLITEAL ARTERY;  Surgeon: Gretta Began, MD;  Location: [facility] OR;  Service: Vascular;  Laterality: Right;  Thrombectomy and Revision using 7mm x 10cm stretch goretex graft  . Intraoperative arteriogram 08/22/2011    Procedure: INTRA OPERATIVE ARTERIOGRAM;  Surgeon: Gretta Began, MD;  Location: [facility] OR;  Service: Vascular;  Laterality: Right;  to lower leg  . Multiple tooth extractions 08-29-2011    5 teeth extracted   . Pci 01/17/12    RLE  . Breast lumpectomy     right  . Tonsillectomy and adenoidectomy   . Coronary angioplasty with stent placement   . Carotid endarterectomy ~ 2005    left  . Aorta - bilateral femoral artery bypass graft 03/26/2012    Procedure: AORTA BIFEMORAL BYPASS GRAFT;  Surgeon: Larina Earthly, MD;  Location: [facility] OR;  Service: Vascular;  Laterality: N/A;  Aortic-bifemoral bypass using 14x14mm Hemashield graft .   . Umbilical hernia repair 03/26/2012    Procedure: HERNIA REPAIR UMBILICAL ADULT;  Surgeon: Larina Earthly, MD;  Location: [facility] OR;  Service: Vascular;  Laterality: N/A;  Removal of Umbilical hernia sac  . Femoral artery exploration 03/26/2012  Procedure: FEMORAL ARTERY EXPLORATION;  Surgeon: Larina Earthly, MD;  Location: [facility] OR;  Service: Vascular;  Laterality: Right;  with Revision of Popliteal-Peroneal bypass graft using 6mm x 10cm thin wall goretex graft    Family History  Problem Relation Age of Onset  . Heart attack Mother   . Heart attack Father   . Hypertension Brother   . Diabetes Brother   . Hypertension Brother     Social History:  reports that she quit smoking about 10 years ago. Her smoking use included Cigarettes. She has a 78 pack-year smoking history. She has never used smokeless tobacco. She reports that she drinks about 7 ounces of alcohol per  week. She reports that she does not use illicit drugs.  Allergies:  Allergies  Allergen Reactions  . Codeine Nausea And Vomiting  . Erythromycin Nausea And Vomiting  . Hydrocodone-Acetaminophen Nausea And Vomiting  . Hydromorphone Nausea And Vomiting  . Propoxyphene Hcl Nausea And Vomiting  . Statins Other (See Comments)    Leg cramps  . Acetaminophen Other (See Comments)    Does not tolerate well, nausea  . Eggs Or Egg-Derived Products     "pt does not eat"  . Ibuprofen Other (See Comments)    Does not tolerate well  . Shellfish-Derived Products     "pt does not eat"    Medications:  I have reviewed the patient's current medications. Prior to Admission:  Prescriptions prior to admission  Medication Sig Dispense Refill  . ALPRAZolam (XANAX) 0.25 MG tablet Take 0.25 mg by mouth daily.       Marland Kitchen aspirin 325 MG EC tablet Take 325 mg by mouth daily.      Marland Kitchen aspirin 81 MG tablet Take 81 mg by mouth daily.       . calcium carbonate (TUMS) 500 MG chewable tablet Chew 1-4 tablets by mouth daily as needed. For acid reflux. Pt can take 1 to 4 tabs as needed for reflux      . Cholecalciferol 25000 UNITS CAPS Take 25,000 Units by mouth 3 (three) times a week. Monday, Wednesday, friday      . clopidogrel (PLAVIX) 75 MG tablet Take 1 tablet (75 mg total) by mouth daily.  30 tablet  9  . dexlansoprazole (DEXILANT) 60 MG capsule Take 60 mg by mouth 2 (two) times daily.      Marland Kitchen glyBURIDE (DIABETA) 2.5 MG tablet Take 2.5 mg by mouth every morning.       . hydrochlorothiazide 25 MG tablet Take 25 mg by mouth daily.       . iron polysaccharides (NIFEREX) 150 MG capsule Take 150 mg by mouth 2 (two) times daily.      . isosorbide mononitrate (IMDUR) 30 MG 24 hr tablet Take 15 mg by mouth 2 (two) times daily.      . metoprolol succinate (TOPROL-XL) 25 MG 24 hr tablet TAKE 1 TABLET EVERY DAY  30 tablet  1  . omega-3 acid ethyl esters (LOVAZA) 1 G capsule Take 2 g by mouth 2 (two) times daily.       .  pioglitazone-metformin (ACTOPLUS MET) 15-500 MG per tablet Take 1 tablet by mouth daily. Brand name only-does not work if components are given separately per patient      . Probiotic Product (ACIDOPHILUS PEARLS PO) Take 1 capsule by mouth daily.      . prochlorperazine (COMPAZINE) 10 MG tablet Take 10 mg by mouth every 6 (six) hours as needed. For nausea      .  ramipril (ALTACE) 1.25 MG capsule Take 1.25 mg by mouth daily.      . sitaGLIPtan (JANUVIA) 100 MG tablet Take 100 mg by mouth at bedtime.       . meperidine (DEMEROL) 50 MG tablet Take 25-50 mg by mouth every 4 (four) hours as needed. For pain      . nitroGLYCERIN (NITROSTAT) 0.4 MG SL tablet Place 0.4 mg under the tongue every 5 (five) minutes as needed. For chest pain       Scheduled:   . aspirin  325 mg Oral Daily  . bisacodyl  10 mg Rectal Once  . cefUROXime (ZINACEF)  IV  750 mg Intravenous Once  . chlorhexidine      . dexlansoprazole  60 mg Oral BID  . etomidate      . furosemide  40 mg Intravenous Once  . furosemide  40 mg Intravenous Once  . insulin aspart  0-9 Units Subcutaneous Q4H  . metoprolol tartrate  25 mg Oral BID  . rocuronium  50 mg Intravenous Once  . Vitamin D (Ergocalciferol)  50,000 Units Oral Q7 days  . DISCONTD: aspirin EC  325 mg Oral Daily  . DISCONTD: chlorhexidine  15 mL Mouth/Throat BID  . DISCONTD: Cholecalciferol  25,000 Units Oral 3 times weekly  . DISCONTD: metoprolol  2.5 mg Intravenous Q6H  . DISCONTD: metoprolol tartrate  25 mg Oral BID  . DISCONTD: morphine   Intravenous Q4H  . DISCONTD: pantoprazole (PROTONIX) IV  40 mg Intravenous Q24H   Review of Systems  Unable to perform ROS: intubated   Blood pressure 117/45, pulse 85, temperature 98.7 F (37.1 C), temperature source Oral, resp. rate 24, height 5\' 4"  (1.626 m), weight 80 kg (176 lb 5.9 oz), SpO2 100.00%. Physical Exam  Constitutional: She appears well-developed and well-nourished.  HENT:  Mouth/Throat: No oropharyngeal exudate.         Intubated, no exudate or thrush in mouth; minimal clear secretions  Eyes: Conjunctivae normal and EOM are normal. Pupils are equal, round, and reactive to light. No scleral icterus.  Neck: Normal range of motion. Neck supple. No tracheal deviation present.       Right IJ in place.   Cardiovascular: Normal rate and regular rhythm.   Murmur heard.      Soft systolic murmur @ LSB. Left leg warm. Distal pulse present. Right leg warm to thigh, cool 1/2 way past calf, no popliteal, DP or PT on right. Radial pulses present  Respiratory: Effort normal. She has no wheezes. She has rales. She exhibits no tenderness.       Scattered occasional rale but fairly clear anteriorly/superiorly  GI: Soft. She exhibits no distension. There is no tenderness. There is no rebound.  Musculoskeletal: She exhibits edema.       Trace LEE bilaterally  Neurological: She is alert.       Intubated; sedated; but alert to name  Skin:       Skin warm except right LE   Labs reviewed; na 140, K 4.9, cr 0.55, trop I 2.3, plt 162, h/h 7.3/22.8, wbc 10.2 abg 7.26/59/76 Albumin 2.5  Multiple EKGs reviewed; 9/25 PM with some ST changes anteriorly and laterally (lateral ischemia) and repeat 03:xx with more subtle ST changes but persistent ST changes laterally (ischemia) Chest x-ray reviewed; bilateral interstitial edema, questionable right hilar infiltrate  Problem List Ischemic Right Lower extremity S/p  aortobifemoral bypass Elevated Troponin/NSTEMI T2DM Flash Pulmonary Edema Peripheral vascular Disease Acidosis Anemia Hypertension Dyslipidemia  Assessment/Plan:  65 yo woman with prior CAD and PCI, PVD, HLD, HTN s/p aortobifemoral bypass with ongoing right foot ischemia who was re-intubated for flash pulmonary edema and subsequently found to have elevated troponin and nonspecific EKG changes of ischemia. I favor diagnosis of NSTEMI in setting of significant stress of surgery and ongoing leg ischemia while patient  is off plavix (although patient completed her necessary 12 months of plavix). Treat medically as able and pursue cardiac catheterization when patient can be initiated on P2Y12 s/p surgery and no urgent need for further surgery (right foot amputation?).  - heparin bolus and gtt initiated - she's had 325 mg ASA and on asa 81 mg daily - Vascular team to discuss with Cardiology in AM regarding possible need for right foot amputation and timing of left heart catheterization/coronary angiography and if/when re-initiation of plavix can occur - continue metoprolol 25 mg bid as able - make sure asa 81 mg daily - re-challenge with low dose crestor 2.5 mg given statin allergy if patient/family amenable - Echocardiogram ordered  Kinzi Frediani 03/29/2012, 4:15 AM

## 2012-03-29 NOTE — Progress Notes (Signed)
Dr. Vassie Loll advised of critical troponin level 2.28 ng/dl

## 2012-03-29 NOTE — Progress Notes (Signed)
Subjective: Interval History: The patient had rapid pulmonary compromise at approximately 11 PM and was intubated with diagnosis of flash pulmonary edema. She did have elevated troponins associated with this event. I certainly appreciate the excellent care of critical care medicine and cardiology evaluation and 4 AM. I have reviewed these. Currently the patient is alert oriented on the vent. She denies any chest pain and denies any other pain.  Objective: Vital signs in last 24 hours: Temp:  [97.8 F (36.6 C)-99.9 F (37.7 C)] 99.9 F (37.7 C) (09/26 0751) Pulse Rate:  [83-124] 98  (09/26 0737) Resp:  [0-26] 24  (09/26 0737) BP: (117-177)/(45-138) 135/62 mmHg (09/26 0737) SpO2:  [87 %-100 %] 100 % (09/26 0737) FiO2 (%):  [60 %] 60 % (09/26 0737)  Intake/Output from previous day: 09/25 0701 - 09/26 0700 In: 1874 [P.O.:600; I.V.:645; Blood:629] Out: 3105 [Urine:3105] Intake/Output this shift:    Abdominal wound is healing nicely as her groin incisions with no evidence of erythema. She has 2+ femoral pulses bilaterally. She does have a palpable left posterior tibial pulse. There is no significant change in her right foot. It is quite cool and cyanotic from her ankle distally. She is warm to the level of her calf. There is some tenderness in her incision in her right calf but does not appear to have any tenderness in the calf and ankle below this.  Lab Results:  Basename 03/29/12 0522 03/28/12 0415  WBC 10.7* 10.2  HGB 9.7* 7.3*  HCT 30.6* 22.8*  PLT 187 162   BMET  Basename 03/29/12 0522 03/28/12 0415  NA 137 140  K 4.1 4.9  CL 100 108  CO2 28 26  GLUCOSE 131* 146*  BUN 15 11  CREATININE 0.61 0.55  CALCIUM 8.6 8.2*    Studies/Results: Dg Chest 2 View  03/21/2012  *RADIOLOGY REPORT*  Clinical Data: Preadmission chest radiograph.  No complaints.  CHEST - 2 VIEW  Comparison: None.  Findings: Cardiopericardial silhouette is within normal limits for projection.  Aortic arch  atherosclerosis.  Symmetric bilateral pleural apical thickening.  No airspace disease.  No effusion. Small calcified granuloma at the right apex interposed between the right posterior fourth and fifth ribs.  Similar calcified granuloma is present at the left apex.  No definite calcified lymph nodes are identified.  IMPRESSION: No active cardiopulmonary disease.  Old granulomatous disease.  The   Original Report Authenticated By: Andreas Newport, M.D.    Portable Chest Xray  03/28/2012  *RADIOLOGY REPORT*  Clinical Data: 66 year old female intubated.  PORTABLE CHEST - 1 VIEW  Comparison: 0640 hours the same day and earlier.  Findings: Portable semi upright AP view 2338 hours.  And tracheal tube now in place, tip in good position between level of clavicles and carina.  Enteric tube now in position, courses to the left upper quadrant and tip not included.  Stable right IJ introducer sheath.   Stable cardiac size and mediastinal contours.  Increased confluent opacity at the right lung base superimposed on diffuse increased interstitial markings.  No pneumothorax or large effusion.  IMPRESSION: 1.  Lines and tubes in good position as above. 2.  New confluent right infrahilar opacity could reflect aspiration or pneumonia.   Original Report Authenticated By: Harley Hallmark, M.D.    Dg Chest Port 1 View  03/28/2012  *RADIOLOGY REPORT*  Clinical Data: Postop.  Atelectasis.  PORTABLE CHEST - 1 VIEW  Comparison: 03/27/2012  Findings: Interval extubation and removal of Swan-Ganz catheter and NG  tube.  Improving bibasilar aeration with decreasing atelectasis. Heart is borderline in size.  Diffuse interstitial prominence throughout the lungs, possible mild interstitial edema.  IMPRESSION: Improving bibasilar aeration following extubation.  Continued interstitial prominence may reflect interstitial edema.   Original Report Authenticated By: Cyndie Chime, M.D.    Dg Chest Port 1 View  03/27/2012  *RADIOLOGY REPORT*   Clinical Data: Status post aorto bifemoral bypass and umbilical hernia repair.  PORTABLE CHEST - 1 VIEW  Comparison: 03/26/2012 and 03/21/2012.  Findings: 0649 hours.  The endotracheal tube, nasogastric tube and Swan-Ganz catheter are unchanged.  There is increased left lower lobe atelectasis and a small left pleural effusion.  No pneumothorax or pulmonary edema is seen.  Heart size and mediastinal contours are stable.  Calcifications are noted adjacent to the left shoulder suggesting hydroxyapatite deposition.  IMPRESSION:  1.  Increased left lower lobe atelectasis and small left pleural effusion. 2.  Otherwise stable postoperative chest with stable support system.   Original Report Authenticated By: Gerrianne Scale, M.D.    Dg Chest Portable 1 View  03/26/2012  *RADIOLOGY REPORT*  Clinical Data: Postop central line placement.  PORTABLE CHEST - 1 VIEW  Comparison: 03/21/2012  Findings: A right jugular Swan-Ganz catheter is seen with tip in the proximal right pulmonary artery.  Endotracheal tube and nasogastric tube are seen in appropriate position.  No pneumothorax identified.  Both lungs are clear.  Heart size is stable.  IMPRESSION:  No active lung disease.  No pneumothorax identified.   Original Report Authenticated By: Danae Orleans, M.D.    Dg Abd Portable 1v  03/26/2012  *RADIOLOGY REPORT*  Clinical Data: 66 year old female status post stent placement.  PORTABLE ABDOMEN - 1 VIEW  Comparison: CT 02/03/1812.  Findings: Portable supine view 1746 hours.  Enteric tube partially visible tip at the level of the gastric body.  Mild motion artifact.  Calcified atherosclerosis, severe in the pelvis and about the proximal lower extremities.  Bilateral common iliac artery stent re-identified.  Right external iliac artery stent re- identified.  Partial visualization of a stent in the proximal right lower extremity.  Paucity of bowel gas, blurred by motion. Questionable gas in the bladder, may reflect recent catheter  placement.  IMPRESSION: 1.  Advanced pelvic and proximal femoral atherosclerosis with multiple vascular stent in place. 2.  Suggestion of gas within the bladder, query recent Foley placement/removal. 3.  Motion artifact affecting the visualized bowel gas.   Original Report Authenticated By: Harley Hallmark, M.D.    Anti-infectives: Anti-infectives     Start     Dose/Rate Route Frequency Ordered Stop   03/26/12 1745   cefUROXime (ZINACEF) 1.5 g in dextrose 5 % 50 mL IVPB        1.5 g 100 mL/hr over 30 Minutes Intravenous Every 12 hours 03/26/12 1732 03/27/12 0830   03/26/12 1345   cefUROXime (ZINACEF) 750 mg in dextrose 5 % 50 mL IVPB  Status:  Discontinued        750 mg 100 mL/hr over 30 Minutes Intravenous  Once 03/26/12 1342 03/29/12 0746   03/25/12 1452   cefUROXime (ZINACEF) 1.5 g in dextrose 5 % 50 mL IVPB        1.5 g 100 mL/hr over 30 Minutes Intravenous 30 min pre-op 03/25/12 1452 03/26/12 1345          Assessment/Plan: s/p Procedure(s) (LRB) with comments: AORTA BIFEMORAL BYPASS GRAFT (N/A) - Aortic-bifemoral bypass using 14x65mm Hemashield graft .  HERNIA  REPAIR UMBILICAL ADULT (N/A) - Removal of Umbilical hernia sac FEMORAL ARTERY EXPLORATION (Right) - with Revision of Popliteal-Peroneal bypass graft using 6mm x 10cm thin wall goretex graft Postop day #3 from aorto bifemoral bypass and attempted revision of right femoral to peroneal bypass. New complication of pulmonary edema and reintubation at 11 PM yesterday. Patient is hemodynamically stable. I appreciate pulmonary and cardiology evaluation. Had a long discussion with the patient and her husband this morning regarding this setback. Hopefully with diuresis this will be a bili to have extubation soon hopefully tomorrow. Regarding her right foot. I do not feel this will be viable but she currently does not have any evidence of progressive muscle death or any other indication for Ethelyne Erich amputation. I did discuss with the patient  and husband this is almost certain T. and their concern is regarding timing of this. I explained again that the indication for amputation would be pain or progressive tissue loss in either of these are currently present. Continue to support regarding cardiopulmonary issues.   LOS: 3 days   Fransisca Shawn 03/29/2012, 8:06 AM

## 2012-03-29 NOTE — Progress Notes (Signed)
    Subjective:  Pt intubated and awake. Denies chest pain.   Objective:  Vital Signs in the last 24 hours: Temp:  [97.8 F (36.6 C)-99.9 F (37.7 C)] 98.6 F (37 C) (09/26 1155) Pulse Rate:  [82-124] 82  (09/26 1200) Resp:  [0-26] 25  (09/26 1200) BP: (111-177)/(45-138) 111/48 mmHg (09/26 1200) SpO2:  [87 %-100 %] 100 % (09/26 1200) FiO2 (%):  [50 %-60 %] 50 % (09/26 1131)  Intake/Output from previous day: 09/25 0701 - 09/26 0700 In: 1874 [P.O.:600; I.V.:645; Blood:629] Out: 3105 [Urine:3105]  Physical Exam: Pt is alert and alert. Intubated. NAD HEENT: normal Neck: JVP - normal Lungs: coarse bilaterally CV: RRR with soft systolic murmur at the LSB Abd: soft, diffusely tender Ext: diffuse edema  Lab Results:  [facility] 03/29/12 0800 03/29/12 0522  WBC 12.4* 10.7*  HGB 10.7* 9.7*  PLT 191 187    Basename 03/29/12 0522 03/28/12 0415  NA 137 140  K 4.1 4.9  CL 100 108  CO2 28 26  GLUCOSE 131* 146*  BUN 15 11  CREATININE 0.61 0.55    Basename 03/29/12 0524 03/28/12 2300  TROPONINI 2.21* 2.28*    Cardiac Studies: 2D Echo: Study Conclusions  - Left ventricle: Poor endocardial definiion but septum and apex appear hypokinetic The cavity size was mildly dilated. Wall thickness was normal. Systolic function was mildly to moderately reduced. The estimated ejection fraction was in the range of 40% to 45%. - Mitral valve: Calcified annulus. Mildly thickened leaflets  Tele: sinus rhythm  Assessment/Plan:  1. Severe PAD with ischemic right foot, s/p Ao Bifem surgery 2. NSTEMI with acute pulmonary edema and respiratory failure 3. HTN  Tough situation. I'm concerned about cardiac ischemia as a potential cause of pulmonary edema, especially considering new LV dysfunction and elevated troponin. Discussed her case with Dr Delford Field and Dr Early and reviewed with the patient and her husband. She is on appropriate med Rx with ASA, lasix IV, and metoprolol with a good early  response to lasix. I think she should have a cardiac cath after she stabilizes and is extubated. If BP elevates, would start IV NTG. Vascular access may be an issue for cath, but will try to do her cath radially. Will follow with you, thx.  Tonny Bollman, M.D. 03/29/2012, 12:18 PM

## 2012-03-30 ENCOUNTER — Inpatient Hospital Stay (HOSPITAL_COMMUNITY): Payer: Medicare Other

## 2012-03-30 DIAGNOSIS — I5041 Acute combined systolic (congestive) and diastolic (congestive) heart failure: Secondary | ICD-10-CM

## 2012-03-30 LAB — BASIC METABOLIC PANEL
CO2: 29 mEq/L (ref 19–32)
CO2: 27 mEq/L (ref 19–32)
Chloride: 101 mEq/L (ref 96–112)
GFR calc non Af Amer: >90 mL/min (ref >90)
Glucose, Bld: 129 mg/dL — ABNORMAL HIGH (ref 70–99)
Potassium: 3.7 mEq/L (ref 3.5–5.1)
Potassium: 3.4 mEq/L — ABNORMAL LOW (ref 3.5–5.1)
Sodium: 142 mEq/L (ref 135–145)
Sodium: 141 mEq/L (ref 135–145)

## 2012-03-30 LAB — CBC
HCT: 27.4 % — ABNORMAL LOW (ref 36.0–46.0)
Hemoglobin: 9.2 g/dL — ABNORMAL LOW (ref 12.0–15.0)
MCH: 27.7 pg (ref 26.0–34.0)
MCHC: 32.1 g/dL (ref 30.0–36.0)
MCV: 86.7 fL (ref 78.0–100.0)
MCV: 86.4 fL (ref 78.0–100.0)
RBC: 3.16 MIL/uL — ABNORMAL LOW (ref 3.87–5.11)
RBC: 3.32 MIL/uL — ABNORMAL LOW (ref 3.87–5.11)
WBC: 7.7 10*3/uL (ref 4.0–10.5)

## 2012-03-30 LAB — GLUCOSE, CAPILLARY
Glucose-Capillary: 112 mg/dL — ABNORMAL HIGH (ref 70–99)
Glucose-Capillary: 110 mg/dL — ABNORMAL HIGH (ref 70–99)
Glucose-Capillary: 120 mg/dL — ABNORMAL HIGH (ref 70–99)
Glucose-Capillary: 133 mg/dL — ABNORMAL HIGH (ref 70–99)

## 2012-03-30 MED ORDER — FUROSEMIDE 10 MG/ML IJ SOLN
INTRAMUSCULAR | Status: AC
  Filled 2012-03-30: qty 4

## 2012-03-30 MED ORDER — NALOXONE HCL 0.4 MG/ML IJ SOLN: 0.4000 mg | INTRAMUSCULAR | Status: DC | PRN

## 2012-03-30 MED ORDER — METOPROLOL TARTRATE 1 MG/ML IV SOLN
5.0000 mg | INTRAVENOUS | Status: DC
  Administered 2012-03-30 – 2012-04-01 (×11): 5 mg via INTRAVENOUS
  Filled 2012-03-30 (×13): qty 5

## 2012-03-30 MED ORDER — SODIUM CHLORIDE 0.9 % IJ SOLN: 9.0000 mL | INTRAMUSCULAR | Status: DC | PRN

## 2012-03-30 MED ORDER — POTASSIUM CHLORIDE CRYS ER 20 MEQ PO TBCR
40.0000 meq | EXTENDED_RELEASE_TABLET | Freq: Once | ORAL | Status: AC
  Administered 2012-03-30: 40 meq via ORAL
  Filled 2012-03-30: qty 2

## 2012-03-30 MED ORDER — FUROSEMIDE 10 MG/ML IJ SOLN
40.0000 mg | Freq: Four times a day (QID) | INTRAMUSCULAR | Status: AC
  Administered 2012-03-30 (×2): 40 mg via INTRAVENOUS
  Filled 2012-03-30 (×2): qty 4

## 2012-03-30 MED ORDER — METOPROLOL TARTRATE 1 MG/ML IV SOLN: 5.0000 mg | Freq: Four times a day (QID) | INTRAVENOUS | Status: DC

## 2012-03-30 MED ORDER — MORPHINE SULFATE (PF) 1 MG/ML IV SOLN
INTRAVENOUS | Status: DC
  Administered 2012-03-30: 10:00:00 via INTRAVENOUS
  Administered 2012-03-30: 10.99 mg via INTRAVENOUS
  Administered 2012-03-31: 11:00:00 via INTRAVENOUS
  Administered 2012-03-31: 7.81 mg via INTRAVENOUS
  Administered 2012-03-31: 1.5 mg via INTRAVENOUS
  Administered 2012-03-31: 3 mg via INTRAVENOUS
  Administered 2012-03-31: 7 mg via INTRAVENOUS
  Administered 2012-03-31: 6 mg via INTRAVENOUS
  Administered 2012-04-01: 2.8 mg via INTRAVENOUS
  Filled 2012-03-30 (×3): qty 25

## 2012-03-30 MED ORDER — FUROSEMIDE 10 MG/ML IJ SOLN
40.0000 mg | Freq: Four times a day (QID) | INTRAMUSCULAR | Status: AC
  Administered 2012-03-30 – 2012-03-31 (×2): 40 mg via INTRAVENOUS

## 2012-03-30 MED ORDER — ALPRAZOLAM 0.25 MG PO TABS
0.2500 mg | ORAL_TABLET | Freq: Once | ORAL | Status: AC
  Administered 2012-03-30: 0.25 mg via ORAL
  Filled 2012-03-30: qty 1

## 2012-03-30 MED ORDER — DIPHENHYDRAMINE HCL 12.5 MG/5ML PO ELIX
12.5000 mg | ORAL_SOLUTION | Freq: Four times a day (QID) | ORAL | Status: DC | PRN
  Filled 2012-03-30: qty 5

## 2012-03-30 MED ORDER — DIPHENHYDRAMINE HCL 50 MG/ML IJ SOLN: 12.5000 mg | Freq: Four times a day (QID) | INTRAMUSCULAR | Status: DC | PRN

## 2012-03-30 MED ORDER — ONDANSETRON HCL 4 MG/2ML IJ SOLN
4.0000 mg | Freq: Four times a day (QID) | INTRAMUSCULAR | Status: DC | PRN
  Administered 2012-04-01: 4 mg via INTRAVENOUS
  Filled 2012-03-30: qty 2

## 2012-03-30 NOTE — Procedures (Signed)
Extubation Procedure Note  Patient Details:   Name: [name] DOB: [birth year] MRN: [id number]   Airway Documentation:     Evaluation  O2 sats: stable throughout Complications: No apparent complications Patient did tolerate procedure well. Bilateral Breath Sounds: Clear;Diminished Suctioning: Airway;Oral Yes  Pt tolerated wean, okay to extubate per MD. Pt positive for small cuff leak, and was extubated to 5lpm [place]. RT will continue to monitor.   Parke Poisson 03/30/2012, 9:30 AM

## 2012-03-30 NOTE — Progress Notes (Signed)
Pt's heart rate increased from mid 90's to 120's  Pt complains of feeling hot, SOB, attempting to remove gown and BP cuff.  Sats decreased to 87-88%.  RR 25.  BP 119/79 (89).  Oxygen increased, HOB raised to >45 degrees, pt coached on taking slow, deep breaths.  Cooled room, fanned patient.  Lopressor given.  After several minutes, patient's vitals returned to baseline, pt experiencing some relief.  Coached on IS (able to pull 500 on IS).  MD Deterding made aware.  Orders received.  Will continue to monitor.    Ulis Rias, RN

## 2012-03-30 NOTE — Progress Notes (Signed)
    Subjective:  No chest pain. Breathing ok on vent.  Objective:  Vital Signs in the last 24 hours: Temp:  [98.6 F (37 C)-100.4 F (38 C)] 100.1 F (37.8 C) (09/27 0801) Pulse Rate:  [82-110] 107  (09/27 0739) Resp:  [17-25] 24  (09/27 0739) BP: (111-165)/(42-96) 113/59 mmHg (09/27 0739) SpO2:  [97 %-100 %] 100 % (09/27 0739) FiO2 (%):  [40 %-60 %] 40 % (09/27 0739) Weight:  [80.7 kg (177 lb 14.6 oz)] 80.7 kg (177 lb 14.6 oz) (09/27 0600)  Intake/Output from previous day: 09/26 0701 - 09/27 0700 In: 1256 [I.V.:1156; IV Piggyback:100] Out: 3855 [Urine:3855]  Physical Exam: Pt is alert, on vent, NAD HEENT: normal Neck: JVP - normal Lungs: CTA bilaterally CV: tachy and regular without murmur or gallop Abd: deferred Ext: diffuse edema with more marked edema of the right leg  Lab Results:  Basename 03/30/12 0415 03/29/12 0800  WBC 7.7 12.4*  HGB 8.7* 10.7*  PLT 189 191    Basename 03/30/12 0415 03/29/12 1800  NA 142 139  K 3.7 3.7  CL 101 99  CO2 29 27  GLUCOSE 111* 120*  BUN 16 16  CREATININE 0.58 0.56    Basename 03/29/12 1130 03/29/12 0524  TROPONINI 1.66* 2.21*   Tele: personally reviewed. Sinus rhythm, sinus tachycardia.  Assessment/Plan:  1. Acute systolic/diastolic CHF/Acute pulmonary edema. Suspect due to volume shifts/HTN/myocardial ischemia. Diuresing well (negative 2600 yesterday) with IV lasix. CXR images reviewed and looks much better! Plans to extubate as per CCM.  2. NSTEMI - likely type 2. Consider cardiac cath as she recovers, especially considering need for further surgery. No chest pain. Continue ASA and heparin, beta-blocker.  Tonny Bollman, M.D. 03/30/2012, 8:49 AM

## 2012-03-30 NOTE — Progress Notes (Signed)
Name: [name] MRN: [id number] DOB: 11/04/1945    LOS: 4  Referring Provider:  Dr Brantley Fling Reason for Referral:  Post op resp failure  PULMONARY / CRITICAL CARE MEDICINE  HPI:  66 year old h/o multiple vascular bypass procedures, PVC underwent elective Aortobifemoral bypass with 14 x 18 the shield graft with reimplantation of accessory left renal artery and thrombectomy of right femoral to below knee popliteal Gore-Tex graft extension to the peroneal artery with 6 mm Gore-Tex.  The surgery time was prolonged as complicated. Still with some concerns hypoperfused rt foot. Presents with post op resp failure.    Events Since Admission: 9/23- Aorto BiFem, post op vent  Current Status: Diuresed well. Ok for extubation 9/27 AM  Vital Signs: Temp:  [98.6 F (37 C)-100.4 F (38 C)] 100.1 F (37.8 C) (09/27 0801) Pulse Rate:  [82-117] 117  (09/27 0925) Resp:  [17-25] 21  (09/27 0925) BP: (111-165)/(42-96) 125/90 mmHg (09/27 0925) SpO2:  [97 %-100 %] 97 % (09/27 0925) FiO2 (%):  [40 %-50 %] 40 % (09/27 0739) Weight:  [80.7 kg (177 lb 14.6 oz)] 80.7 kg (177 lb 14.6 oz) (09/27 0600)  Physical Examination: Gen: awake, ETT in place  HEENT: NCAT, PERRL,  PULM:clearer CV: RRR, no mgr, no JVD AB: BS infrequent, midline scar well healed, soft, nontender, no hsm Ext: L leg warm, R cool, mottled no edema, no clubbing, no cyanosis Derm: no rash or skin breakdown Neuro: awake on vent,  follows commands   Active Problems:  CORONARY ATHEROSCLEROSIS NATIVE CORONARY ARTERY  UNSPECIFIED PERIPHERAL VASCULAR DISEASE  Atherosclerotic PVD with intermittent claudication  Respiratory failure, post-operative  Acute respiratory failure with hypoxia  Pulmonary edema, acute  Acute combined systolic and diastolic heart failure   ASSESSMENT AND PLAN  PULMONARY  Lab 03/29/12 0049 03/29/12 0029 03/27/12 0344 03/26/12 1801 03/26/12 1511  PHART 7.264* 7.219* 7.381 7.335* 7.336*  PCO2ART 58.5*  67.1* 41.8 46.1* 49.4*  PO2ART 76.0* 24.0* 75.0* 151.0* 356.0*  HCO3 26.5* 27.3* 24.7* 24.4* 26.3*  O2SAT 92.0 32.0 94.0 99.0 100.0    Vent Mode:  [-] PSV FiO2 (%):  [40 %-50 %] 40 % Set Rate:  [24 bmp] 24 bmp Vt Set:  [330 mL] 330 mL PEEP:  [5 cmH20-8 cmH20] 5 cmH20 Pressure Support:  [5 cmH20] 5 cmH20 Plateau Pressure:  [14 cmH20-17 cmH20] 16 cmH20 CXR: pulmonary edema ETT:  9/23>>>9/24 ETT: 9/25>>9/27  A:  Post op resp failure, acute pulm edema>>improved P:   Extubate, passed SBT Diurese further    CARDIOVASCULAR  Lab 03/29/12 1130 03/29/12 0524 03/28/12 2300  TROPONINI 1.66* 2.21* 2.28*  LATICACIDVEN -- -- --  PROBNP -- -- --   ECG: None 9/27 Lines: rt IJ swan 9/23>>>9/24, still with R IJ sleeve>> ECHO 9/26:  EF 45%    A: HTN, sinus tachy.  NONSTEMI post op S/p  Aortobifem and R popliteal to perineal artery graft 9/23.  Still no perfusion R foot 9/24  P:  Lasix 40mg  x 2 doses Change  beta blocker to 5mg  IV q4H    RENAL  Lab 03/30/12 0415 03/29/12 1800 03/29/12 0522 03/28/12 0415 03/27/12 0341 03/26/12 1755  NA 142 139 137 140 136 --  K 3.7 3.7 -- -- -- --  CL 101 99 100 108 102 --  CO2 29 27 28 26 24  --  BUN 16 16 15 11 11  --  CREATININE 0.58 0.56 0.61 0.55 0.61 --  CALCIUM 8.6 8.6 8.6 8.2* 8.2* --  MG -- -- -- -- 2.5 1.2*  PHOS -- -- -- -- -- --   Intake/Output      09/26 0701 - 09/27 0700 09/27 0701 - 09/28 0700   P.O.     I.V. (mL/kg) 1156 (14.3)    Blood     IV Piggyback 100    Total Intake(mL/kg) 1256 (15.6)    Urine (mL/kg/hr) 3855 (2)    Total Output 3855    Net -2599          Foley:  9/23>>> Good UOP 9/25  A:  Hypomagnesemia>>repleted. No renal issues postop. Good UOP P:   Monitor bmet daily and chk bmet at 4pm 9/27 Give more  lasix  GASTROINTESTINAL  Lab 03/27/12 0341  AST 26  ALT 10  ALKPHOS 50  BILITOT 0.3  PROT 5.0*  ALBUMIN 2.5*    A: GERD, cannot take /tolerate Protonix.  Can only use dexilant P:   IV  pepcid NPO>>advance as tolerated    HEMATOLOGIC  Lab 03/30/12 0415 03/29/12 0800 03/29/12 0522 03/28/12 0415 03/27/12 0341 03/26/12 1755  HGB 8.7* 10.7* 9.7* 7.3* 9.4* --  HCT 27.4* 33.0* 30.6* 22.8* 28.5* --  PLT 189 191 187 162 187 --  INR -- -- -- -- -- 1.18  APTT -- -- -- -- -- 40*   A:  Anemia P:  tfx trigger for Hgb</= 7.0   Recheck cbc 2pm   INFECTIOUS  Lab 03/30/12 0415 03/29/12 0800 03/29/12 0522 03/28/12 0415 03/27/12 0341  WBC 7.7 12.4* 10.7* 10.2 9.6  PROCALCITON -- -- -- -- --   Cultures: None  Antibiotics: Peri op cefuoxime  A:  Peri op abx, at risk rt lower ext infection P:   Per Dr Arbie Cookey recs Follow fever curve    ENDOCRINE  Lab 03/30/12 0800 03/30/12 0355 03/30/12 0031 03/29/12 1959 03/29/12 1541  GLUCAP 98 110* 112* 122* 85   A:  DM 2  Ok control. On januvia at home and actos/met  P:   SSI q4h per protocol  NEUROLOGIC  A:  No neuro deficits. Pain control good.  P:   PCA morphine.     BEST PRACTICE / DISPOSITION Level of Care:  ICU Primary Service:  Vasc Consultants:  PCCM Code Status:  Full Diet: NPO DVT Px:  GI Px:  IV H2, cannot take protonix Skin Integrity:  normal Social / Family: husband updated at bedside 9/27 AM  CC 40 minutes.  Dorcas Carrow  PCCM [facility] Beeper  [phone]  Cell  [phone]  If no response or cell goes to voicemail, call beeper [phone]

## 2012-03-30 NOTE — Progress Notes (Signed)
Subjective: Interval History: none.. Comfortable on vent  Objective: Vital signs in last 24 hours: Temp:  [98.6 F (37 C)-100.4 F (38 C)] 99.7 F (37.6 C) (09/27 0356) Pulse Rate:  [82-110] 102  (09/27 0700) Resp:  [17-25] 21  (09/27 0700) BP: (111-165)/(42-96) 113/59 mmHg (09/27 0700) SpO2:  [97 %-100 %] 99 % (09/27 0700) FiO2 (%):  [40 %-60 %] 40 % (09/27 0300) Weight:  [177 lb 14.6 oz (80.7 kg)] 177 lb 14.6 oz (80.7 kg) (09/27 0600)  Intake/Output from previous day: 09/26 0701 - 09/27 0700 In: 1256 [I.V.:1156; IV Piggyback:100] Out: 3855 [Urine:3855] Intake/Output this shift:    Abd and groin wds healing well. R foot with no change. Cold and cyanotic foot  Lab Results:  Basename 03/30/12 0415 03/29/12 0800  WBC 7.7 12.4*  HGB 8.7* 10.7*  HCT 27.4* 33.0*  PLT 189 191   BMET  Basename 03/30/12 0415 03/29/12 1800  NA 142 139  K 3.7 3.7  CL 101 99  CO2 29 27  GLUCOSE 111* 120*  BUN 16 16  CREATININE 0.58 0.56  CALCIUM 8.6 8.6    Studies/Results: Dg Chest 2 View  03/21/2012  *RADIOLOGY REPORT*  Clinical Data: Preadmission chest radiograph.  No complaints.  CHEST - 2 VIEW  Comparison: None.  Findings: Cardiopericardial silhouette is within normal limits for projection.  Aortic arch atherosclerosis.  Symmetric bilateral pleural apical thickening.  No airspace disease.  No effusion. Small calcified granuloma at the right apex interposed between the right posterior fourth and fifth ribs.  Similar calcified granuloma is present at the left apex.  No definite calcified lymph nodes are identified.  IMPRESSION: No active cardiopulmonary disease.  Old granulomatous disease.  The   Original Report Authenticated By: Andreas Newport, M.D.    Dg Chest Port 1 View  03/29/2012  *RADIOLOGY REPORT*  Clinical Data: Pulmonary edema  PORTABLE CHEST - 1 VIEW  Comparison: 03/28/2012  Findings: Endotracheal tube tip projects 5.5 cm proximal to the carina.  NG tube descends below the level  of the image.  Heart size upper normal to mildly enlarged.  Central vascular congestion. Interstitial and more confluent airspace opacities within the lung bases, mildly increased in the interval.  Right IJ sheath.  No pneumothorax.  Small effusion not excluded on the left.  No interval osseous change.  IMPRESSION: Interstitial and right greater than left airspace opacities, in keeping with pulmonary edema (favored) versus multifocal pneumonia. The airspace opacities have increased mildly in the interval.  Appropriately positioned support devices as above.   Original Report Authenticated By: Waneta Martins, M.D.    Portable Chest Xray  03/28/2012  *RADIOLOGY REPORT*  Clinical Data: 66 year old female intubated.  PORTABLE CHEST - 1 VIEW  Comparison: 0640 hours the same day and earlier.  Findings: Portable semi upright AP view 2338 hours.  And tracheal tube now in place, tip in good position between level of clavicles and carina.  Enteric tube now in position, courses to the left upper quadrant and tip not included.  Stable right IJ introducer sheath.   Stable cardiac size and mediastinal contours.  Increased confluent opacity at the right lung base superimposed on diffuse increased interstitial markings.  No pneumothorax or large effusion.  IMPRESSION: 1.  Lines and tubes in good position as above. 2.  New confluent right infrahilar opacity could reflect aspiration or pneumonia.   Original Report Authenticated By: Harley Hallmark, M.D.    Dg Chest Port 1 View  03/28/2012  *RADIOLOGY  REPORT*  Clinical Data: Postop.  Atelectasis.  PORTABLE CHEST - 1 VIEW  Comparison: 03/27/2012  Findings: Interval extubation and removal of Swan-Ganz catheter and NG tube.  Improving bibasilar aeration with decreasing atelectasis. Heart is borderline in size.  Diffuse interstitial prominence throughout the lungs, possible mild interstitial edema.  IMPRESSION: Improving bibasilar aeration following extubation.  Continued  interstitial prominence may reflect interstitial edema.   Original Report Authenticated By: Cyndie Chime, M.D.    Dg Chest Port 1 View  03/27/2012  *RADIOLOGY REPORT*  Clinical Data: Status post aorto bifemoral bypass and umbilical hernia repair.  PORTABLE CHEST - 1 VIEW  Comparison: 03/26/2012 and 03/21/2012.  Findings: 0649 hours.  The endotracheal tube, nasogastric tube and Swan-Ganz catheter are unchanged.  There is increased left lower lobe atelectasis and a small left pleural effusion.  No pneumothorax or pulmonary edema is seen.  Heart size and mediastinal contours are stable.  Calcifications are noted adjacent to the left shoulder suggesting hydroxyapatite deposition.  IMPRESSION:  1.  Increased left lower lobe atelectasis and small left pleural effusion. 2.  Otherwise stable postoperative chest with stable support system.   Original Report Authenticated By: Gerrianne Scale, M.D.    Dg Chest Portable 1 View  03/26/2012  *RADIOLOGY REPORT*  Clinical Data: Postop central line placement.  PORTABLE CHEST - 1 VIEW  Comparison: 03/21/2012  Findings: A right jugular Swan-Ganz catheter is seen with tip in the proximal right pulmonary artery.  Endotracheal tube and nasogastric tube are seen in appropriate position.  No pneumothorax identified.  Both lungs are clear.  Heart size is stable.  IMPRESSION:  No active lung disease.  No pneumothorax identified.   Original Report Authenticated By: Danae Orleans, M.D.    Dg Abd Portable 1v  03/26/2012  *RADIOLOGY REPORT*  Clinical Data: 66 year old female status post stent placement.  PORTABLE ABDOMEN - 1 VIEW  Comparison: CT 02/03/1812.  Findings: Portable supine view 1746 hours.  Enteric tube partially visible tip at the level of the gastric body.  Mild motion artifact.  Calcified atherosclerosis, severe in the pelvis and about the proximal lower extremities.  Bilateral common iliac artery stent re-identified.  Right external iliac artery stent re- identified.   Partial visualization of a stent in the proximal right lower extremity.  Paucity of bowel gas, blurred by motion. Questionable gas in the bladder, may reflect recent catheter placement.  IMPRESSION: 1.  Advanced pelvic and proximal femoral atherosclerosis with multiple vascular stent in place. 2.  Suggestion of gas within the bladder, query recent Foley placement/removal. 3.  Motion artifact affecting the visualized bowel gas.   Original Report Authenticated By: Harley Hallmark, M.D.    Anti-infectives: Anti-infectives     Start     Dose/Rate Route Frequency Ordered Stop   03/26/12 1745   cefUROXime (ZINACEF) 1.5 g in dextrose 5 % 50 mL IVPB        1.5 g 100 mL/hr over 30 Minutes Intravenous Every 12 hours 03/26/12 1732 03/27/12 0830   03/26/12 1345   cefUROXime (ZINACEF) 750 mg in dextrose 5 % 50 mL IVPB  Status:  Discontinued        750 mg 100 mL/hr over 30 Minutes Intravenous  Once 03/26/12 1342 03/29/12 0746   03/25/12 1452   cefUROXime (ZINACEF) 1.5 g in dextrose 5 % 50 mL IVPB        1.5 g 100 mL/hr over 30 Minutes Intravenous 30 min pre-op 03/25/12 1452 03/26/12 1345  Assessment/Plan: s/p Procedure(s) (LRB) with comments: AORTA BIFEMORAL BYPASS GRAFT (N/A) - Aortic-bifemoral bypass using 14x23mm Hemashield graft .  HERNIA REPAIR UMBILICAL ADULT (N/A) - Removal of Umbilical hernia sac FEMORAL ARTERY EXPLORATION (Right) - with Revision of Popliteal-Peroneal bypass graft using 6mm x 10cm thin wall goretex graft Stable on vent.  Spont breathing on vent.  Hopefully extubate this am.  Appreciate CCM and Cardiology help   LOS: 4 days   EARLY, TODD 03/30/2012, 7:41 AM

## 2012-03-30 NOTE — Progress Notes (Signed)
ANTICOAGULATION CONSULT NOTE - Follow-Up Consult  Pharmacy Consult for heparin Indication: NSTEMI  Allergies  Allergen Reactions  . Codeine Nausea And Vomiting  . Erythromycin Nausea And Vomiting  . Hydrocodone-Acetaminophen Nausea And Vomiting  . Hydromorphone Nausea And Vomiting  . Propoxyphene Hcl Nausea And Vomiting  . Statins Other (See Comments)    Leg cramps  . Acetaminophen Other (See Comments)    Does not tolerate well, nausea  . Eggs Or Egg-Derived Products     "pt does not eat"  . Ibuprofen Other (See Comments)    Does not tolerate well  . Shellfish-Derived Products     "pt does not eat"    Patient Measurements: Height: 5\' 4"  (162.6 cm) Weight: 176 lb 5.9 oz (80 kg) IBW/kg (Calculated) : 54.7  Heparin Dosing Weight: 73kg  Vital Signs: Temp: 99.7 F (37.6 C) (09/27 0356) Temp src: Oral (09/27 0356) BP: 149/58 mmHg (09/27 0500) Pulse Rate: 100  (09/27 0500)  Labs:  Basename 03/30/12 0500 03/30/12 0415 03/29/12 1800 03/29/12 1130 03/29/12 0800 03/29/12 0524 03/29/12 0522 03/28/12 2300  HGB -- 8.7* -- -- 10.7* -- -- --  HCT -- 27.4* -- -- 33.0* -- 30.6* --  PLT -- 189 -- -- 191 -- 187 --  APTT -- -- -- -- -- -- -- --  LABPROT -- -- -- -- -- -- -- --  INR -- -- -- -- -- -- -- --  HEPARINUNFRC 0.13* -- <0.10* -- 0.17* -- -- --  CREATININE -- 0.58 0.56 -- -- -- 0.61 --  CKTOTAL -- -- -- -- -- -- -- --  CKMB -- -- -- -- -- -- -- --  TROPONINI -- -- -- 1.66* -- 2.21* -- 2.28*    Estimated Creatinine Clearance: 69.8 ml/min (by C-G formula based on Cr of 0.58).   Assessment: 66yo female s/p aortic bifemoral bypass 9/23. Intubated due to acute hypoxemic respiratory with rapid decompensation. Pt now on heparin for elevated troponin.   Heparin level remains subtherapeutic , will increase gtt further. No bleeding reported.  Goal of Therapy:  Heparin level 0.3-0.5 units/ml Monitor platelets by anticoagulation protocol: Yes   Plan:  1) Increase heparin to  1650 units/hr 2) Check daily heparin level and CBC 3) check 8h Heparin level after rate change  Verlene Mayer, PharmD, [place] Pager [phone] 03/30/2012 5:52 AM

## 2012-03-30 NOTE — Progress Notes (Signed)
ANTICOAGULATION CONSULT NOTE - Follow-Up Consult  Pharmacy Consult for heparin Indication: NSTEMI  Allergies  Allergen Reactions  . Codeine Nausea And Vomiting  . Erythromycin Nausea And Vomiting  . Hydrocodone-Acetaminophen Nausea And Vomiting  . Hydromorphone Nausea And Vomiting  . Propoxyphene Hcl Nausea And Vomiting  . Statins Other (See Comments)    Leg cramps  . Acetaminophen Other (See Comments)    Does not tolerate well, nausea  . Eggs Or Egg-Derived Products     "pt does not eat"  . Ibuprofen Other (See Comments)    Does not tolerate well  . Shellfish-Derived Products     "pt does not eat"    Patient Measurements: Height: 5\' 4"  (162.6 cm) Weight: 177 lb 14.6 oz (80.7 kg) IBW/kg (Calculated) : 54.7  Heparin Dosing Weight: 73kg  Vital Signs: Temp: 100.7 F (38.2 C) (09/27 1222) Temp src: Axillary (09/27 1222) BP: 127/75 mmHg (09/27 1400) Pulse Rate: 96  (09/27 1400)  Labs:  Basename 03/30/12 1400 03/30/12 0500 03/30/12 0415 03/29/12 1800 03/29/12 1130 03/29/12 0800 03/29/12 0524 03/29/12 0522 03/28/12 2300  HGB 9.2* -- 8.7* -- -- -- -- -- --  HCT 28.7* -- 27.4* -- -- 33.0* -- -- --  PLT 222 -- 189 -- -- 191 -- -- --  APTT -- -- -- -- -- -- -- -- --  LABPROT -- -- -- -- -- -- -- -- --  INR -- -- -- -- -- -- -- -- --  HEPARINUNFRC 0.42 0.13* -- <0.10* -- -- -- -- --  CREATININE -- -- 0.58 0.56 -- -- -- 0.61 --  CKTOTAL -- -- -- -- -- -- -- -- --  CKMB -- -- -- -- -- -- -- -- --  TROPONINI -- -- -- -- 1.66* -- 2.21* -- 2.28*    Estimated Creatinine Clearance: 70.1 ml/min (by C-G formula based on Cr of 0.58).   Assessment: 66yo female s/p aortic bifemoral bypass 9/23. Pt on heparin for elevated troponin.   Heparin level therapeutic (0.42). No bleeding reported.  Goal of Therapy:  Heparin level 0.3-0.5 units/ml Monitor platelets by anticoagulation protocol: Yes   Plan:  1) Continue heparin to 1650 units/hr 2) Check daily heparin level and  CBC  Christoper Fabian, PharmD, BCPS Clinical pharmacist, pager [phone] 03/30/2012 3:08 PM

## 2012-03-31 ENCOUNTER — Inpatient Hospital Stay (HOSPITAL_COMMUNITY): Payer: Medicare Other

## 2012-03-31 LAB — BASIC METABOLIC PANEL
CO2: 30 mEq/L (ref 19–32)
Calcium: 8.9 mg/dL (ref 8.4–10.5)
GFR calc Af Amer: >90 mL/min (ref >90)
Sodium: 142 mEq/L (ref 135–145)

## 2012-03-31 LAB — GLUCOSE, CAPILLARY
Glucose-Capillary: 160 mg/dL — ABNORMAL HIGH (ref 70–99)
Glucose-Capillary: 194 mg/dL — ABNORMAL HIGH (ref 70–99)
Glucose-Capillary: 133 mg/dL — ABNORMAL HIGH (ref 70–99)

## 2012-03-31 LAB — CBC
MCH: 28 pg (ref 26.0–34.0)
Platelets: 256 10*3/uL (ref 150–400)
RBC: 3.61 MIL/uL — ABNORMAL LOW (ref 3.87–5.11)
RDW: 15.9 % — ABNORMAL HIGH (ref 11.5–15.5)
WBC: 11.1 10*3/uL — ABNORMAL HIGH (ref 4.0–10.5)

## 2012-03-31 LAB — POCT I-STAT 3, ART BLOOD GAS (G3+)
Acid-Base Excess: 4 mmol/L — ABNORMAL HIGH (ref 0.0–2.0)
Bicarbonate: 29.8 mEq/L — ABNORMAL HIGH (ref 20.0–24.0)
Bicarbonate: 31.9 mEq/L — ABNORMAL HIGH (ref 20.0–24.0)
O2 Saturation: 100 %
O2 Saturation: 87 %
TCO2: 34 mmol/L (ref 0–100)
pCO2 arterial: 75 mmHg (ref 35.0–45.0)
pO2, Arterial: 210 mmHg — ABNORMAL HIGH (ref 80.0–100.0)
pO2, Arterial: 65 mmHg — ABNORMAL LOW (ref 80.0–100.0)

## 2012-03-31 MED ORDER — FUROSEMIDE 10 MG/ML IJ SOLN
40.0000 mg | Freq: Two times a day (BID) | INTRAMUSCULAR | Status: DC
  Administered 2012-03-31 – 2012-04-02 (×6): 40 mg via INTRAVENOUS
  Filled 2012-03-31 (×8): qty 4

## 2012-03-31 MED ORDER — ALPRAZOLAM 0.25 MG PO TABS
0.2500 mg | ORAL_TABLET | Freq: Every day | ORAL | Status: DC
  Administered 2012-03-31 – 2012-04-09 (×10): 0.25 mg via ORAL
  Filled 2012-03-31 (×10): qty 1

## 2012-03-31 MED ORDER — POTASSIUM CHLORIDE 10 MEQ/50ML IV SOLN
10.0000 meq | INTRAVENOUS | Status: AC
  Administered 2012-03-31 (×6): 10 meq via INTRAVENOUS
  Filled 2012-03-31 (×2): qty 150

## 2012-03-31 MED ORDER — FUROSEMIDE 10 MG/ML IJ SOLN
INTRAMUSCULAR | Status: AC
  Administered 2012-03-31: 40 mg
  Filled 2012-03-31: qty 4

## 2012-03-31 NOTE — Progress Notes (Signed)
ANTICOAGULATION CONSULT NOTE - Follow Up Consult  Pharmacy Consult for Heparin Indication: NSTEMI  Allergies  Allergen Reactions  . Codeine Nausea And Vomiting  . Erythromycin Nausea And Vomiting  . Hydrocodone-Acetaminophen Nausea And Vomiting  . Hydromorphone Nausea And Vomiting  . Propoxyphene Hcl Nausea And Vomiting  . Statins Other (See Comments)    Leg cramps  . Acetaminophen Other (See Comments)    Does not tolerate well, nausea  . Eggs Or Egg-Derived Products     "pt does not eat"  . Ibuprofen Other (See Comments)    Does not tolerate well  . Shellfish-Derived Products     "pt does not eat"    Patient Measurements: Height: 5\' 4"  (162.6 cm) Weight: 177 lb 14.6 oz (80.7 kg) IBW/kg (Calculated) : 54.7    Vital Signs: Temp: 97.4 F (36.3 C) (09/28 0800) Temp src: Axillary (09/28 0800) BP: 125/76 mmHg (09/28 0900) Pulse Rate: 97  (09/28 0900)  Labs:  Basename 03/31/12 0350 03/30/12 1530 03/30/12 1400 03/30/12 0500 03/30/12 0415 03/29/12 1130 03/29/12 0524 03/28/12 2300  HGB 10.1* -- 9.2* -- -- -- -- --  HCT 31.2* -- 28.7* -- 27.4* -- -- --  PLT 256 -- 222 -- 189 -- -- --  APTT -- -- -- -- -- -- -- --  LABPROT -- -- -- -- -- -- -- --  INR -- -- -- -- -- -- -- --  HEPARINUNFRC 0.50 -- 0.42 0.13* -- -- -- --  CREATININE 0.56 0.54 -- -- 0.58 -- -- --  CKTOTAL -- -- -- -- -- -- -- --  CKMB -- -- -- -- -- -- -- --  TROPONINI -- -- -- -- -- 1.66* 2.21* 2.28*    Estimated Creatinine Clearance: 70.1 ml/min (by C-G formula based on Cr of 0.56).  Assessment: Patient is a 66 y.o F on heparin for NSTEMI with plan for possible cardiac cath once patient is more stable.  Heparin level is at goal.  No bleeding noted.  Goal of Therapy:  Heparin level 0.3-0.5 units/ml Monitor platelets by anticoagulation protocol: Yes   Plan:  1) no change for heparin  Stevie Charter P 03/31/2012,10:47 AM

## 2012-03-31 NOTE — Progress Notes (Signed)
VASCULAR PROGRESS NOTE  SUBJECTIVE: currently on BiPAP.  PHYSICAL EXAM: Filed Vitals:   03/31/12 0415 03/31/12 0500 03/31/12 0600 03/31/12 0700  BP: 117/75 152/72 144/101 142/109  Pulse: 92 89 92 94  Temp:      TempSrc:      Resp: 21 21 17 23   Height:      Weight:      SpO2: 96% 99% 100% 100%   Abdominal incision looks fine. Groin incisions look fine.  Some ischemic toes on the right foot.  Left foot warm.  LABS: Lab Results  Component Value Date   WBC 11.1* 03/31/2012   HGB 10.1* 03/31/2012   HCT 31.2* 03/31/2012   MCV 86.4 03/31/2012   PLT 256 03/31/2012   Lab Results  Component Value Date   CREATININE 0.56 03/31/2012   Lab Results  Component Value Date   INR 1.18 03/26/2012   CBG (last 3)   Basename 03/31/12 0358 03/30/12 2354 03/30/12 1952  GLUCAP 152* 110* 133*    ASSESSMENT/PLAN: 1. Pulmonary edema being addressed by critical care medicine. She has responded nicely to Lasix. (I/O = - 3100 yesterday). 2. The patient continued intravenous heparin. 3. Start diet when pulmonary status stable. 4. In addition, she has acute systolic and diastolic congestive heart failure and is being followed by cardiology. She had a non-ST MI and is being considered for cardiac catheterization.  Waverly Ferrari, MD, FACS Beeper: [phone] 03/31/2012

## 2012-03-31 NOTE — Progress Notes (Signed)
eLink Physician-Brief Progress Note Patient Name: [name] DOB: Aug 24, 1945 MRN: [id number]  Date of Service  03/31/2012   HPI/Events of Note  Additional calls from bedside nurse reporting onging issues with anxiety, resp distress and drop in sats.  Per nurse physical exam ongoing coarse breath sounds bilaterally.  Drop in sats to the mid 80s with tachycardia.  Is in negative balance today but pulm edema on CXR this AM.  eICU Interventions  Plan: 1. Additional dose of lasix IV 2. BiPAP as tolerated    Intervention Category Intermediate Interventions: Respiratory distress - evaluation and management  DETERDING,ELIZABETH 03/31/2012, 12:42 AM

## 2012-03-31 NOTE — Progress Notes (Signed)
Increased HR, increased BP increased RR, decreased sats, pt complaining of feeling hot, is diaphoretic.  Denies any pain.  Placed on non-rebreather mask.  Still sats 85-89%, labored breathing.  Pt uncomfortable, still feeling hot. Fidgety.  ABG and CXR obtained.  eLink called; MD Deterding made aware.  Pt placed on BiPap per RT.  MD Bice at bedside.  Pt appears more comfortable.  Sats in 90s.  Vitals slightly more stable (see "vital signs" for numbers).  Will continue to monitor closely.  Ulis Rias, RN

## 2012-03-31 NOTE — Progress Notes (Signed)
    Subjective:  No chest pain. Denies dyspnea on mask  Objective:  Vital Signs in the last 24 hours: Temp:  [97.4 F (36.3 C)-100.7 F (38.2 C)] 97.4 F (36.3 C) (09/28 0800) Pulse Rate:  [88-136] 97  (09/28 0900) Resp:  [11-40] 20  (09/28 0900) BP: (105-184)/(58-134) 125/76 mmHg (09/28 0900) SpO2:  [83 %-100 %] 100 % (09/28 0900) FiO2 (%):  [40 %-96 %] 50 % (09/28 0800)  Intake/Output from previous day: 09/27 0701 - 09/28 0700 In: 987.7 [I.V.:883.7; IV Piggyback:104] Out: 4160 [Urine:4160]  Physical Exam: Pt is alert, on vent, NAD HEENT: normal Neck:supple Lungs: CTA bilaterally CV: tachy and regular without murmur or gallop Abd: NT, ND Ext: 1+ edema with more marked edema of the right leg  Lab Results:  Basename 03/31/12 0350 03/30/12 1400  WBC 11.1* 8.4  HGB 10.1* 9.2*  PLT 256 222    Basename 03/31/12 0350 03/30/12 1530  NA 142 141  K 3.2* 3.4*  CL 96 99  CO2 30 27  GLUCOSE 161* 129*  BUN 19 17  CREATININE 0.56 0.54    Basename 03/29/12 1130 03/29/12 0524  TROPONINI 1.66* 2.21*   Tele: Sinus rhythm, sinus tachycardia.  Assessment/Plan:  1. Acute systolic/diastolic CHF/Acute pulmonary edema. Suspect due to volume shifts/HTN/myocardial ischemia. Diuresing well (negative 2600 yesterday) with IV lasix. Plan to continue lasix at 40 mg IV BID and follow renal function.  2. NSTEMI - likely type 2. Consider cardiac cath as she recovers, especially considering need for further surgery. No chest pain. Continue ASA and heparin, beta-blocker. Apparently intolerant to statins; readdress as outpatient.  Olga Millers, M.D. 03/31/2012, 9:57 AM

## 2012-03-31 NOTE — Progress Notes (Signed)
Pt was placed on BiPAP due to increased heart rate from 90's to 120's. ABG was drawn prior to pt being placed on BIPAP Pt was feeling SOB, and respiratory rate was in the 40's. Sat's decreased to mid 80's. Pt breath sounds were coarse crackles. MD Bice was at bedside. Bipap settings 12/6 at 70%. Pt vitals are stable will continue to monitor.

## 2012-03-31 NOTE — Progress Notes (Signed)
Name: [name] MRN: [id number] DOB: 09/26/1945    LOS: 5  Referring Provider:  Dr Brantley Fling Reason for Referral:  Post op resp failure  PULMONARY / CRITICAL CARE MEDICINE  HPI:  66 year old h/o multiple vascular bypass procedures, PVC underwent elective Aortobifemoral bypass with 14 x 18 the shield graft with reimplantation of accessory left renal artery and thrombectomy of right femoral to below knee popliteal Gore-Tex graft extension to the peroneal artery with 6 mm Gore-Tex.  The surgery time was prolonged as complicated. Still with some concerns hypoperfused rt foot. Presents with post op resp failure due to NSTEMI post op & pulmonary edema    Events Since Admission: 9/23- Aorto BiFem, post op vent  Current Status: Diuresed well. Off bipap this am  Vital Signs: Temp:  [97.4 F (36.3 C)-100.7 F (38.2 C)] 97.4 F (36.3 C) (09/28 0800) Pulse Rate:  [88-136] 93  (09/28 1100) Resp:  [11-40] 18  (09/28 1100) BP: (105-184)/(58-134) 125/80 mmHg (09/28 1100) SpO2:  [83 %-100 %] 98 % (09/28 1100) FiO2 (%):  [40 %-96 %] 50 % (09/28 0800)  Physical Examination: Gen: awake, interactive HEENT: NCAT, PERRL,  PULM:clearer CV: RRR, no mgr, no JVD AB: BS infrequent, midline scar well healed, soft, nontender, no hsm Ext: L leg warm, R cool, mottled no edema, no clubbing, no cyanosis Derm: no rash or skin breakdown Neuro: non focal   Active Problems:  CORONARY ATHEROSCLEROSIS NATIVE CORONARY ARTERY  UNSPECIFIED PERIPHERAL VASCULAR DISEASE  Atherosclerotic PVD with intermittent claudication  Respiratory failure, post-operative  Acute respiratory failure with hypoxia  Pulmonary edema, acute  Acute combined systolic and diastolic heart failure   ASSESSMENT AND PLAN  PULMONARY  Lab 03/31/12 0306 03/31/12 0009 03/29/12 0049 03/29/12 0029 03/27/12 0344  PHART 7.401 7.238* 7.264* 7.219* 7.381  PCO2ART 48.1* 75.0* 58.5* 67.1* 41.8  PO2ART 210.0* 65.0* 76.0* 24.0* 75.0*  HCO3  29.8* 31.9* 26.5* 27.3* 24.7*  O2SAT 100.0 87.0 92.0 32.0 94.0    Vent Mode:  [-]  FiO2 (%):  [40 %-96 %] 50 % CXR: pulmonary edema ETT:  9/23>>>9/24 ETT: 9/25>>9/27 9/28 CXR mild edema A:  Post op resp failure, acute pulm edema>>improved P:   bipap prn Diurese further    CARDIOVASCULAR  Lab 03/29/12 1130 03/29/12 0524 03/28/12 2300  TROPONINI 1.66* 2.21* 2.28*  LATICACIDVEN -- -- --  PROBNP -- -- --   ECG: None 9/27 Lines: rt IJ swan 9/23>>>9/24, still with R IJ sleeve>> ECHO 9/26:  EF 45%    A: HTN, sinus tachy.  NONSTEMI post op S/p  Aortobifem and R popliteal to perineal artery graft 9/23.  Still no perfusion R foot 9/24  P:  Lasix 40mg  q 12h Lopressor 5mg  IV q4H Cath at some point    RENAL  Lab 03/31/12 0350 03/30/12 1530 03/30/12 0415 03/29/12 1800 03/29/12 0522 03/27/12 0341 03/26/12 1755  NA 142 141 142 139 137 -- --  K 3.2* 3.4* -- -- -- -- --  CL 96 99 101 99 100 -- --  CO2 30 27 29 27 28  -- --  BUN 19 17 16 16 15  -- --  CREATININE 0.56 0.54 0.58 0.56 0.61 -- --  CALCIUM 8.9 8.6 8.6 8.6 8.6 -- --  MG -- -- -- -- -- 2.5 1.2*  PHOS -- -- -- -- -- -- --   Intake/Output      09/27 0701 - 09/28 0700 09/28 0701 - 09/29 0700   I.V. (mL/kg) 883.7 (11) 34.5 (  0.4)   IV Piggyback 104 150   Total Intake(mL/kg) 987.7 (12.2) 184.5 (2.3)   Urine (mL/kg/hr) 4160 (2.1) 325 (0.8)   Total Output 4160 325   Net -3172.3 -140.5         Foley:  9/23>>> Good UOP 9/25  A:  Hypomagnesemia>>repleted. No renal issues postop. Good UOP Hypokalemia P:   Monitor bmet daily  While on lasix  GASTROINTESTINAL  Lab 03/27/12 0341  AST 26  ALT 10  ALKPHOS 50  BILITOT 0.3  PROT 5.0*  ALBUMIN 2.5*    A: GERD, cannot take /tolerate Protonix.  Can only use dexilant P:   IV pepcid NPO>>advance to clears    HEMATOLOGIC  Lab 03/31/12 0350 03/30/12 1400 03/30/12 0415 03/29/12 0800 03/29/12 0522 03/26/12 1755  HGB 10.1* 9.2* 8.7* 10.7* 9.7* --  HCT 31.2* 28.7*  27.4* 33.0* 30.6* --  PLT 256 222 189 191 187 --  INR -- -- -- -- -- 1.18  APTT -- -- -- -- -- 40*   A:  Anemia P:  tfx trigger for Hgb</= 7.0   Recheck cbc 2pm   INFECTIOUS  Lab 03/31/12 0350 03/30/12 1400 03/30/12 0415 03/29/12 0800 03/29/12 0522  WBC 11.1* 8.4 7.7 12.4* 10.7*  PROCALCITON -- -- -- -- --   Cultures: None  Antibiotics: Peri op cefuoxime  A:  Peri op abx, at risk rt lower ext infection P:   Per Dr Arbie Cookey recs - afebrile   ENDOCRINE  Lab 03/31/12 0746 03/31/12 0358 03/30/12 2354 03/30/12 1952 03/30/12 1558  GLUCAP 160* 152* 110* 133* 120*   A:  DM 2  Ok control. On januvia at home and actos/met  P:   SSI q4h per protocol  NEUROLOGIC  A:  No neuro deficits. Pain control good.  P:   PCA morphine.   ET co2    BEST PRACTICE / DISPOSITION Level of Care:  ICU Primary Service:  Vasc Consultants:  PCCM Code Status:  Full Diet: NPO DVT Px:  GI Px:  IV H2, cannot take protonix Skin Integrity:  normal Social / Family: husband updated at bedside 9/29 AM    [facility] V.MD   230 2526

## 2012-04-01 DIAGNOSIS — I214 Non-ST elevation (NSTEMI) myocardial infarction: Secondary | ICD-10-CM

## 2012-04-01 LAB — BASIC METABOLIC PANEL
Calcium: 8.8 mg/dL (ref 8.4–10.5)
GFR calc non Af Amer: >90 mL/min (ref >90)
Glucose, Bld: 125 mg/dL — ABNORMAL HIGH (ref 70–99)
Sodium: 140 mEq/L (ref 135–145)

## 2012-04-01 LAB — CBC
MCH: 27.8 pg (ref 26.0–34.0)
MCHC: 32.5 g/dL (ref 30.0–36.0)
Platelets: 276 10*3/uL (ref 150–400)
RBC: 3.38 MIL/uL — ABNORMAL LOW (ref 3.87–5.11)

## 2012-04-01 LAB — HEPARIN LEVEL (UNFRACTIONATED): Heparin Unfractionated: 0.22 IU/mL — ABNORMAL LOW (ref 0.30–0.70)

## 2012-04-01 LAB — GLUCOSE, CAPILLARY
Glucose-Capillary: 139 mg/dL — ABNORMAL HIGH (ref 70–99)
Glucose-Capillary: 219 mg/dL — ABNORMAL HIGH (ref 70–99)

## 2012-04-01 MED ORDER — SODIUM CHLORIDE 0.9 % IJ SOLN
10.0000 mL | Freq: Two times a day (BID) | INTRAMUSCULAR | Status: DC
  Administered 2012-04-01 – 2012-04-02 (×3): 10 mL

## 2012-04-01 MED ORDER — METOPROLOL TARTRATE 25 MG PO TABS
25.0000 mg | ORAL_TABLET | Freq: Two times a day (BID) | ORAL | Status: DC
  Administered 2012-04-01 – 2012-04-02 (×3): 25 mg via ORAL
  Filled 2012-04-01 (×6): qty 1

## 2012-04-01 MED ORDER — POTASSIUM CHLORIDE CRYS ER 20 MEQ PO TBCR
40.0000 meq | EXTENDED_RELEASE_TABLET | Freq: Once | ORAL | Status: AC
  Administered 2012-04-01: 40 meq via ORAL
  Filled 2012-04-01: qty 2

## 2012-04-01 MED ORDER — TRAMADOL HCL 50 MG PO TABS
50.0000 mg | ORAL_TABLET | Freq: Four times a day (QID) | ORAL | Status: DC | PRN
  Administered 2012-04-01 – 2012-04-09 (×6): 50 mg via ORAL
  Filled 2012-04-01 (×8): qty 1

## 2012-04-01 MED ORDER — METOPROLOL TARTRATE 12.5 MG HALF TABLET
12.5000 mg | ORAL_TABLET | Freq: Two times a day (BID) | ORAL | Status: DC
  Filled 2012-04-01 (×2): qty 1

## 2012-04-01 MED ORDER — SODIUM CHLORIDE 0.9 % IJ SOLN: 10.0000 mL | INTRAMUSCULAR | Status: DC | PRN

## 2012-04-01 NOTE — Progress Notes (Signed)
Name: [name] MRN: [id number] DOB: March 25, 1946    LOS: 6  Referring Provider:  Dr Brantley Fling Reason for Referral:  Post op resp failure  PULMONARY / CRITICAL CARE MEDICINE  HPI:  66 year old h/o multiple vascular bypass procedures, PVC underwent elective Aortobifemoral bypass with 14 x 18 the shield graft with reimplantation of accessory left renal artery and thrombectomy of right femoral to below knee popliteal Gore-Tex graft extension to the peroneal artery with 6 mm Gore-Tex.  The surgery time was prolonged as complicated. Still with some concerns hypoperfused rt foot. Presents with post op resp failure due to NSTEMI post op & pulmonary edema    Events Since Admission: 9/23- Aorto BiFem, post op vent  Current Status: Diuresed well. Remained Off bipap   Vital Signs: Temp:  [97.7 F (36.5 C)-98.2 F (36.8 C)] 97.7 F (36.5 C) (09/29 1200) Pulse Rate:  [87-113] 100  (09/29 0800) Resp:  [15-31] 31  (09/29 0800) BP: (91-145)/(58-91) 140/79 mmHg (09/29 0800) SpO2:  [92 %-100 %] 100 % (09/29 0849) Weight:  [74 kg (163 lb 2.3 oz)] 74 kg (163 lb 2.3 oz) (09/29 0500)  Physical Examination: Gen: awake, interactive HEENT: NCAT, PERRL,  PULM:clearer CV: RRR, no mgr, no JVD AB: BS infrequent, midline scar well healed, soft, nontender, no hsm Ext: L leg warm, R cool, mottled no edema, no clubbing, no cyanosis Derm: no rash or skin breakdown Neuro: non focal   Active Problems:  CORONARY ATHEROSCLEROSIS NATIVE CORONARY ARTERY  UNSPECIFIED PERIPHERAL VASCULAR DISEASE  Atherosclerotic PVD with intermittent claudication  Respiratory failure, post-operative  Acute respiratory failure with hypoxia  Pulmonary edema, acute  Acute combined systolic and diastolic heart failure  Acute myocardial infarction, subendocardial infarction, initial episode of care   ASSESSMENT AND PLAN  PULMONARY  Lab 03/31/12 0306 03/31/12 0009 03/29/12 0049 03/29/12 0029 03/27/12 0344  PHART 7.401  7.238* 7.264* 7.219* 7.381  PCO2ART 48.1* 75.0* 58.5* 67.1* 41.8  PO2ART 210.0* 65.0* 76.0* 24.0* 75.0*  HCO3 29.8* 31.9* 26.5* 27.3* 24.7*  O2SAT 100.0 87.0 92.0 32.0 94.0      CXR: pulmonary edema ETT:  9/23>>>9/24 ETT: 9/25>>9/27 9/28 CXR mild edema A:  Post op resp failure, acute pulm edema>>improved P:   Dc bipap  Diurese   CARDIOVASCULAR  Lab 03/29/12 1130 03/29/12 0524 03/28/12 2300  TROPONINI 1.66* 2.21* 2.28*  LATICACIDVEN -- -- --  PROBNP -- -- --   ECG: None 9/27 Lines: rt IJ swan 9/23>>>9/24, still with R IJ sleeve>> ECHO 9/26:  EF 45%    A: HTN, sinus tachy.  NONSTEMI post op S/p  Aortobifem and R popliteal to perineal artery graft 9/23.  Still no perfusion R foot 9/24  P:  Lasix 40mg  q 12h Lopressor po Cath at some point    RENAL  Lab 04/01/12 0320 03/31/12 0350 03/30/12 1530 03/30/12 0415 03/29/12 1800 03/27/12 0341 03/26/12 1755  NA 140 142 141 142 139 -- --  K 3.2* 3.2* -- -- -- -- --  CL 98 96 99 101 99 -- --  CO2 34* 30 27 29 27  -- --  BUN 13 19 17 16 16  -- --  CREATININE 0.48* 0.56 0.54 0.58 0.56 -- --  CALCIUM 8.8 8.9 8.6 8.6 8.6 -- --  MG -- -- -- -- -- 2.5 1.2*  PHOS -- -- -- -- -- -- --   Intake/Output      09/28 0701 - 09/29 0700 09/29 0701 - 09/30 0700   P.O. 640  I.V. (mL/kg) 841.1 (11.4) 36.5 (0.5)   IV Piggyback 154    Total Intake(mL/kg) 1635.1 (22.1) 36.5 (0.5)   Urine (mL/kg/hr) 2550 (1.4) 75 (0.2)   Total Output 2550 75   Net -914.9 -38.5         Foley:  9/23>>> Good UOP 9/25  A:  Hypomagnesemia>>repleted. No renal issues postop. Good UOP Hypokalemia P:   Monitor bmet daily  While on lasix  GASTROINTESTINAL  Lab 03/27/12 0341  AST 26  ALT 10  ALKPHOS 50  BILITOT 0.3  PROT 5.0*  ALBUMIN 2.5*    A: GERD, cannot take /tolerate Protonix.  Can only use dexilant P:   Dc  pepcid advance to full liquids    HEMATOLOGIC  Lab 04/01/12 0320 03/31/12 0350 03/30/12 1400 03/30/12 0415 03/29/12 0800 03/26/12  1755  HGB 9.4* 10.1* 9.2* 8.7* 10.7* --  HCT 28.9* 31.2* 28.7* 27.4* 33.0* --  PLT 276 256 222 189 191 --  INR -- -- -- -- -- 1.18  APTT -- -- -- -- -- 40*   A:  Anemia P:  tfx trigger for Hgb</= 7.0      INFECTIOUS  Lab 04/01/12 0320 03/31/12 0350 03/30/12 1400 03/30/12 0415 03/29/12 0800  WBC 8.7 11.1* 8.4 7.7 12.4*  PROCALCITON -- -- -- -- --   Cultures: None  Antibiotics: Peri op cefuoxime  A:  Peri op abx, at risk rt lower ext infection P:   Per Dr Arbie Cookey recs - afebrile   ENDOCRINE  Lab 04/01/12 0749 04/01/12 0338 03/31/12 2329 03/31/12 1942 03/31/12 1545  GLUCAP 142* 129* 163* 133* 194*   A:  DM 2  Ok control. On januvia at home and actos/met  P:   SSI q4h per protocol  NEUROLOGIC  A:  No neuro deficits. Pain control good.  P:   PCA morphine.   ET co2   PCCM to sign off , pl dc RIJ sleeve in 1-2 days  BEST PRACTICE / DISPOSITION Level of Care:  ICU - tr to sdu planned Primary Service:  Vasc  Social / Family: husband updated at bedside 9/29 AM    [facility] V.MD   [phone]

## 2012-04-01 NOTE — Progress Notes (Signed)
ANTICOAGULATION CONSULT NOTE - Follow Up Consult  Pharmacy Consult for heparin Indication: NSTEMI  Labs:  Basename 04/01/12 1530 04/01/12 0320 03/31/12 0350 03/30/12 1530 03/30/12 1400  HGB -- 9.4* 10.1* -- --  HCT -- 28.9* 31.2* -- 28.7*  PLT -- 276 256 -- 222  APTT -- -- -- -- --  LABPROT -- -- -- -- --  INR -- -- -- -- --  HEPARINUNFRC 0.22* 0.29* 0.50 -- --  CREATININE -- 0.48* 0.56 0.54 --  CKTOTAL -- -- -- -- --  CKMB -- -- -- -- --  TROPONINI -- -- -- -- --      . sodium chloride 20 mL/hr at 04/01/12 0030  . heparin 1,650 Units/hr (04/01/12 1200)     Assessment:  NSTEMI:  66yo female who remains slightly subtherapeutic on heparin.  Her heparin infusion was moved to another IV site this morning to allow for uninterrupted flow.  Plan: Increase Heparin to 1800 units/hr Recheck Heparin level in 6 hours  Estella Husk, [place].D., BCPS Clinical Pharmacist  Phone [phone] Pager [phone] 04/01/2012, 4:01 PM

## 2012-04-01 NOTE — Progress Notes (Signed)
ANTICOAGULATION CONSULT NOTE - Follow Up Consult  Pharmacy Consult for heparin Indication: NSTEMI  Labs:  Basename 04/01/12 0320 03/31/12 0350 03/30/12 1530 03/30/12 1400 03/30/12 0415 03/29/12 1130 03/29/12 0524  HGB 9.4* 10.1* -- -- -- -- --  HCT 28.9* 31.2* -- 28.7* -- -- --  PLT 276 256 -- 222 -- -- --  APTT -- -- -- -- -- -- --  LABPROT -- -- -- -- -- -- --  INR -- -- -- -- -- -- --  HEPARINUNFRC 0.29* 0.50 -- 0.42 -- -- --  CREATININE -- 0.56 0.54 -- 0.58 -- --  CKTOTAL -- -- -- -- -- -- --  CKMB -- -- -- -- -- -- --  TROPONINI -- -- -- -- -- 1.66* 2.21*    Assessment/Plan:  66yo female now slightly subtherapeutic on heparin.  Per RN pt has been confused and moves her IV site, RN has had to fix it multiple times throughout the night, which is likely the cause of the lower heparin level.  Plan for PICC placement today for better access.  Will continue gtt at current rate and consider additional level to confirm after PICC placed.  Colleen Can PharmD BCPS 04/01/2012,4:33 AM

## 2012-04-01 NOTE — Progress Notes (Signed)
NURSING NOTE - MORPHINE PCA SYRINGE D/C'D @ 1200 NOON. 11 cc WASTED IN SINK.                           WITNESSED BY Orvis Brill, RN

## 2012-04-01 NOTE — Progress Notes (Signed)
VASCULAR PROGRESS NOTE  SUBJECTIVE: Comfortable. Passing some flatus. Tolerating liquids.  PHYSICAL EXAM: Filed Vitals:   04/01/12 0400 04/01/12 0446 04/01/12 0500 04/01/12 0600  BP: 138/74  145/86 139/72  Pulse: 102  96 90  Temp: 97.9 F (36.6 C)     TempSrc: Oral     Resp: 18 18 24 19   Height:      Weight:   163 lb 2.3 oz (74 kg)   SpO2: 100% 100% 100% 100%   Toes on right foot starting to demarcate. Left foot is warm. Abdomen soft and nontender with normal pitched bowel sounds.  LABS: Lab Results  Component Value Date   WBC 8.7 04/01/2012   HGB 9.4* 04/01/2012   HCT 28.9* 04/01/2012   MCV 85.5 04/01/2012   PLT 276 04/01/2012   Lab Results  Component Value Date   CREATININE 0.48* 04/01/2012   Lab Results  Component Value Date   INR 1.18 03/26/2012   CBG (last 3)   Basename 04/01/12 0338 03/31/12 2329 03/31/12 1942  GLUCAP 129* 163* 133*     ASSESSMENT/PLAN: 1. Pulmonary status much improved. 2. Out of bed to chair today. 3. Consult physical therapy tomorrow. 4. She reportedly had some confusion last night likely related to morphine. Will DC morphine and use po pain medication. 5. Transfer to step down if bed available.  Waverly Ferrari, MD, FACS Beeper: [phone] 04/01/2012

## 2012-04-01 NOTE — Progress Notes (Signed)
    Subjective:  No chest pain. Denies dyspnea on mask  Objective:  Vital Signs in the last 24 hours: Temp:  [97.7 F (36.5 C)-98.2 F (36.8 C)] 97.7 F (36.5 C) (09/29 0800) Pulse Rate:  [87-113] 100  (09/29 0800) Resp:  [15-31] 31  (09/29 0800) BP: (91-145)/(58-91) 140/79 mmHg (09/29 0800) SpO2:  [92 %-100 %] 100 % (09/29 0849) FiO2 (%):  [5 %] 5 % (09/28 1200) Weight:  [163 lb 2.3 oz (74 kg)] 163 lb 2.3 oz (74 kg) (09/29 0500)  Intake/Output from previous day: 09/28 0701 - 09/29 0700 In: 1635.1 [P.O.:640; I.V.:841.1; IV Piggyback:154] Out: 2550 [Urine:2550]  Physical Exam: Pt is alert, on vent, NAD HEENT: normal Neck:supple Lungs: CTA bilaterally CV: tachy and regular without murmur or gallop Abd: NT, ND Ext: 1+ edema with more marked edema of the right leg  Lab Results:  Basename 04/01/12 0320 03/31/12 0350  WBC 8.7 11.1*  HGB 9.4* 10.1*  PLT 276 256    Basename 04/01/12 0320 03/31/12 0350  NA 140 142  K 3.2* 3.2*  CL 98 96  CO2 34* 30  GLUCOSE 125* 161*  BUN 13 19  CREATININE 0.48* 0.56    Basename 03/29/12 1130  TROPONINI 1.66*   Tele: Sinus rhythm, sinus tachycardia.  Assessment/Plan:  1. Acute systolic/diastolic CHF/Acute pulmonary edema. Suspect due to volume shifts/HTN/myocardial ischemia. Diuresing well (negative 2600 yesterday) with IV lasix. Plan to continue lasix at 40 mg IV BID and follow renal function. She is clinically improving.  2. NSTEMI - likely type 2. Plan cardiac cath as she recovers, especially considering need for further surgery. No chest pain. Continue ASA and heparin; increase beta-blocker. She is intolerant to statins.  3. Hyokalemia - supplement  Olga Millers, M.D. 04/01/2012, 9:52 AM

## 2012-04-02 LAB — GLUCOSE, CAPILLARY: Glucose-Capillary: 157 mg/dL — ABNORMAL HIGH (ref 70–99)

## 2012-04-02 LAB — BASIC METABOLIC PANEL
BUN: 9 mg/dL (ref 6–23)
Calcium: 9.2 mg/dL (ref 8.4–10.5)
GFR calc Af Amer: >90 mL/min (ref >90)
GFR calc non Af Amer: >90 mL/min (ref >90)
Potassium: 3 mEq/L — ABNORMAL LOW (ref 3.5–5.1)
Sodium: 137 mEq/L (ref 135–145)

## 2012-04-02 LAB — CBC
Hemoglobin: 10 g/dL — ABNORMAL LOW (ref 12.0–15.0)
MCHC: 33.1 g/dL (ref 30.0–36.0)
Platelets: 317 10*3/uL (ref 150–400)

## 2012-04-02 LAB — HEPARIN LEVEL (UNFRACTIONATED): Heparin Unfractionated: 0.78 IU/mL — ABNORMAL HIGH (ref 0.30–0.70)

## 2012-04-02 MED ORDER — ENOXAPARIN SODIUM 40 MG/0.4ML ~~LOC~~ SOLN
40.0000 mg | SUBCUTANEOUS | Status: DC
  Administered 2012-04-02 – 2012-04-08 (×6): 40 mg via SUBCUTANEOUS
  Filled 2012-04-02 (×8): qty 0.4

## 2012-04-02 MED ORDER — POTASSIUM CHLORIDE CRYS ER 20 MEQ PO TBCR
40.0000 meq | EXTENDED_RELEASE_TABLET | Freq: Two times a day (BID) | ORAL | Status: DC
  Administered 2012-04-02 – 2012-04-04 (×5): 40 meq via ORAL
  Administered 2012-04-05: 21:00:00 10 meq via ORAL
  Administered 2012-04-05 – 2012-04-06 (×2): 40 meq via ORAL
  Filled 2012-04-02 (×13): qty 2

## 2012-04-02 MED ORDER — CALCIUM CARBONATE ANTACID 500 MG PO CHEW
1.0000 | CHEWABLE_TABLET | Freq: Three times a day (TID) | ORAL | Status: DC | PRN
  Administered 2012-04-02: 200 mg via ORAL
  Filled 2012-04-02: qty 1

## 2012-04-02 MED ORDER — PIOGLITAZONE HCL-METFORMIN HCL 15-500 MG PO TABS
1.0000 | ORAL_TABLET | Freq: Every day | ORAL | Status: DC
  Filled 2012-04-02 (×2): qty 1

## 2012-04-02 MED ORDER — MORPHINE SULFATE 2 MG/ML IJ SOLN
2.0000 mg | INTRAMUSCULAR | Status: DC | PRN
  Administered 2012-04-02 – 2012-04-05 (×11): 2 mg via INTRAVENOUS
  Filled 2012-04-02 (×13): qty 1

## 2012-04-02 MED ORDER — LEVALBUTEROL HCL 0.63 MG/3ML IN NEBU
0.6300 mg | INHALATION_SOLUTION | Freq: Four times a day (QID) | RESPIRATORY_TRACT | Status: DC | PRN
  Administered 2012-04-05 – 2012-04-06 (×2): 0.63 mg via RESPIRATORY_TRACT
  Filled 2012-04-02: qty 3

## 2012-04-02 MED ORDER — SODIUM CHLORIDE 0.9 % IJ SOLN
10.0000 mL | INTRAMUSCULAR | Status: DC | PRN
  Administered 2012-04-02: 10 mL
  Administered 2012-04-04: 20 mL
  Administered 2012-04-05 – 2012-04-06 (×2): 10 mL

## 2012-04-02 MED ORDER — DEXLANSOPRAZOLE 60 MG PO CPDR
60.0000 mg | DELAYED_RELEASE_CAPSULE | Freq: Two times a day (BID) | ORAL | Status: DC
  Administered 2012-04-02 – 2012-04-09 (×14): 60 mg via ORAL
  Filled 2012-04-02 (×2): qty 1

## 2012-04-02 MED ORDER — LEVALBUTEROL HCL 0.63 MG/3ML IN NEBU
0.6300 mg | INHALATION_SOLUTION | Freq: Four times a day (QID) | RESPIRATORY_TRACT | Status: DC
  Administered 2012-04-02 – 2012-04-09 (×21): 0.63 mg via RESPIRATORY_TRACT
  Filled 2012-04-02 (×32): qty 3

## 2012-04-02 NOTE — Progress Notes (Signed)
Subjective: Interval History: none.. Comfortable  Objective: Vital signs in last 24 hours: Temp:  [97.7 F (36.5 C)-98.2 F (36.8 C)] 98.1 F (36.7 C) (09/30 0400) Pulse Rate:  [99-118] 106  (09/30 0600) Resp:  [18-31] 26  (09/30 0600) BP: (110-149)/(60-104) 148/93 mmHg (09/30 0600) SpO2:  [96 %-100 %] 100 % (09/30 0600) Weight:  [157 lb 6.5 oz (71.4 kg)] 157 lb 6.5 oz (71.4 kg) (09/30 0600)  Intake/Output from previous day: 09/29 0701 - 09/30 0700 In: 1463 [P.O.:800; I.V.:603; IV Piggyback:60] Out: 2745 [Urine:2745] Intake/Output this shift:    Alert oriented, abdomen soft. Abdominal and both groin and popliteal incisions are all healing without any erythema. 2-3+ femoral pulses bilaterally. Right foot is cold over the distal foot and toes with cyanosis. Warm down to the level of the ankle. She does have some calf and lateral anterior tibial compartment tenderness.  Lab Results:  Basename 04/02/12 0500 04/01/12 0320  WBC 11.7* 8.7  HGB 10.0* 9.4*  HCT 30.2* 28.9*  PLT 317 276   BMET  Basename 04/02/12 0500 04/01/12 0320  NA 137 140  K 3.0* 3.2*  CL 92* 98  CO2 33* 34*  GLUCOSE 163* 125*  BUN 9 13  CREATININE 0.45* 0.48*  CALCIUM 9.2 8.8    Studies/Results: Dg Chest 2 View  03/21/2012  *RADIOLOGY REPORT*  Clinical Data: Preadmission chest radiograph.  No complaints.  CHEST - 2 VIEW  Comparison: None.  Findings: Cardiopericardial silhouette is within normal limits for projection.  Aortic arch atherosclerosis.  Symmetric bilateral pleural apical thickening.  No airspace disease.  No effusion. Small calcified granuloma at the right apex interposed between the right posterior fourth and fifth ribs.  Similar calcified granuloma is present at the left apex.  No definite calcified lymph nodes are identified.  IMPRESSION: No active cardiopulmonary disease.  Old granulomatous disease.  The   Original Report Authenticated By: Andreas Newport, M.D.    Dg Chest Port 1  View  03/31/2012  *RADIOLOGY REPORT*  Clinical Data: Pulmonary edema.  PORTABLE CHEST - 1 VIEW  Comparison: Chest x-ray 03/31/2012.  Findings: Right IJ Cordis in place with tip in the distal internal jugular vein.  Lung volumes are normal. There is cephalization of the pulmonary vasculature and slight indistinctness of the interstitial markings suggestive of mild pulmonary edema.  No consolidative airspace disease.  No pleural effusions.  Heart size is borderline to mildly enlarged.  Atherosclerosis in the thoracic aorta.  IMPRESSION: 1.  Borderline to mildly enlarged cardiac silhouette with mild interstitial pulmonary edema. 2.  Atherosclerosis.   Original Report Authenticated By: Florencia Reasons, M.D.    Dg Chest Port 1 View  03/31/2012  *RADIOLOGY REPORT*  Clinical Data: Difficulty breathing.  PORTABLE CHEST - 1 VIEW  Comparison: 03/30/2012  Findings: Endotracheal tube and nasogastric tube have been removed. Again noted is a right jugular central line with the tip in the right jugular vein region.  There are increased interstitial densities and increased basilar densities.  Findings are concerning for interstitial edema and pleural effusions.  Heart size is stable.  IMPRESSION: Increasing interstitial pulmonary edema.  Increased basilar densities are suggestive for atelectasis and pleural fluid.   Original Report Authenticated By: Richarda Overlie, M.D.    Dg Chest Port 1 View  03/30/2012  *RADIOLOGY REPORT*  Clinical Data: Evaluate endotracheal tube position  PORTABLE CHEST - 1 VIEW  Comparison: 03/29/2012.  Findings: A right internal jugular introducer sheath is in place. An orogastric tube is in place  with the tip located projecting over the region of the gastric fundus.  The side port is positioned overlying the distal esophagus and this could be advanced up to 5 cm to ensure intragastric positioning of the side port if desired. The endotracheal tube tip is located in the proximal trachea, 6.5 cm above the  level of the carina.  Heart and mediastinal contours are unchanged with mild ectasia of the thoracic aorta with intraaortic calcification.  Pulmonary vascular congestion with interstitial prominence, small left pleural effusion and interstitial septal lines is again most suspicious for interstitial pulmonary edema.  More focal basilar density is seen today at the left lung base with relative clearing of the density seen previously at the right lung base and I suspect that this represents shifting basilar positional alveolar edema combined with atelectasis.  Bony structures remain intact.  IMPRESSION: Lines and tubes as above.  Findings most compatible with interstitial edema with shifting basilar edema pattern and probable left lower lobe volume loss.   Original Report Authenticated By: Bertha Stakes, M.D.    Dg Chest Port 1 View  03/29/2012  *RADIOLOGY REPORT*  Clinical Data: Pulmonary edema  PORTABLE CHEST - 1 VIEW  Comparison: 03/28/2012  Findings: Endotracheal tube tip projects 5.5 cm proximal to the carina.  NG tube descends below the level of the image.  Heart size upper normal to mildly enlarged.  Central vascular congestion. Interstitial and more confluent airspace opacities within the lung bases, mildly increased in the interval.  Right IJ sheath.  No pneumothorax.  Small effusion not excluded on the left.  No interval osseous change.  IMPRESSION: Interstitial and right greater than left airspace opacities, in keeping with pulmonary edema (favored) versus multifocal pneumonia. The airspace opacities have increased mildly in the interval.  Appropriately positioned support devices as above.   Original Report Authenticated By: Waneta Martins, M.D.    Portable Chest Xray  03/28/2012  *RADIOLOGY REPORT*  Clinical Data: 66 year old female intubated.  PORTABLE CHEST - 1 VIEW  Comparison: 0640 hours the same day and earlier.  Findings: Portable semi upright AP view 2338 hours.  And tracheal tube now  in place, tip in good position between level of clavicles and carina.  Enteric tube now in position, courses to the left upper quadrant and tip not included.  Stable right IJ introducer sheath.   Stable cardiac size and mediastinal contours.  Increased confluent opacity at the right lung base superimposed on diffuse increased interstitial markings.  No pneumothorax or large effusion.  IMPRESSION: 1.  Lines and tubes in good position as above. 2.  New confluent right infrahilar opacity could reflect aspiration or pneumonia.   Original Report Authenticated By: Harley Hallmark, M.D.    Dg Chest Port 1 View  03/28/2012  *RADIOLOGY REPORT*  Clinical Data: Postop.  Atelectasis.  PORTABLE CHEST - 1 VIEW  Comparison: 03/27/2012  Findings: Interval extubation and removal of Swan-Ganz catheter and NG tube.  Improving bibasilar aeration with decreasing atelectasis. Heart is borderline in size.  Diffuse interstitial prominence throughout the lungs, possible mild interstitial edema.  IMPRESSION: Improving bibasilar aeration following extubation.  Continued interstitial prominence may reflect interstitial edema.   Original Report Authenticated By: Cyndie Chime, M.D.    Dg Chest Port 1 View  03/27/2012  *RADIOLOGY REPORT*  Clinical Data: Status post aorto bifemoral bypass and umbilical hernia repair.  PORTABLE CHEST - 1 VIEW  Comparison: 03/26/2012 and 03/21/2012.  Findings: 0649 hours.  The endotracheal tube, nasogastric tube  and Swan-Ganz catheter are unchanged.  There is increased left lower lobe atelectasis and a small left pleural effusion.  No pneumothorax or pulmonary edema is seen.  Heart size and mediastinal contours are stable.  Calcifications are noted adjacent to the left shoulder suggesting hydroxyapatite deposition.  IMPRESSION:  1.  Increased left lower lobe atelectasis and small left pleural effusion. 2.  Otherwise stable postoperative chest with stable support system.   Original Report Authenticated By:  Gerrianne Scale, M.D.    Dg Chest Portable 1 View  03/26/2012  *RADIOLOGY REPORT*  Clinical Data: Postop central line placement.  PORTABLE CHEST - 1 VIEW  Comparison: 03/21/2012  Findings: A right jugular Swan-Ganz catheter is seen with tip in the proximal right pulmonary artery.  Endotracheal tube and nasogastric tube are seen in appropriate position.  No pneumothorax identified.  Both lungs are clear.  Heart size is stable.  IMPRESSION:  No active lung disease.  No pneumothorax identified.   Original Report Authenticated By: Danae Orleans, M.D.    Dg Abd Portable 1v  03/26/2012  *RADIOLOGY REPORT*  Clinical Data: 66 year old female status post stent placement.  PORTABLE ABDOMEN - 1 VIEW  Comparison: CT 02/03/1812.  Findings: Portable supine view 1746 hours.  Enteric tube partially visible tip at the level of the gastric body.  Mild motion artifact.  Calcified atherosclerosis, severe in the pelvis and about the proximal lower extremities.  Bilateral common iliac artery stent re-identified.  Right external iliac artery stent re- identified.  Partial visualization of a stent in the proximal right lower extremity.  Paucity of bowel gas, blurred by motion. Questionable gas in the bladder, may reflect recent catheter placement.  IMPRESSION: 1.  Advanced pelvic and proximal femoral atherosclerosis with multiple vascular stent in place. 2.  Suggestion of gas within the bladder, query recent Foley placement/removal. 3.  Motion artifact affecting the visualized bowel gas.   Original Report Authenticated By: Harley Hallmark, M.D.    Anti-infectives: Anti-infectives     Start     Dose/Rate Route Frequency Ordered Stop   03/26/12 1745   cefUROXime (ZINACEF) 1.5 g in dextrose 5 % 50 mL IVPB        1.5 g 100 mL/hr over 30 Minutes Intravenous Every 12 hours 03/26/12 1732 03/27/12 0830   03/26/12 1345   cefUROXime (ZINACEF) 750 mg in dextrose 5 % 50 mL IVPB  Status:  Discontinued        750 mg 100 mL/hr over 30  Minutes Intravenous  Once 03/26/12 1342 03/29/12 0746   03/25/12 1452   cefUROXime (ZINACEF) 1.5 g in dextrose 5 % 50 mL IVPB        1.5 g 100 mL/hr over 30 Minutes Intravenous 30 min pre-op 03/25/12 1452 03/26/12 1345          Assessment/Plan: s/p Procedure(s) (LRB) with comments: AORTA BIFEMORAL BYPASS GRAFT (N/A) - Aortic-bifemoral bypass using 14x43mm Hemashield graft .  HERNIA REPAIR UMBILICAL ADULT (N/A) - Removal of Umbilical hernia sac FEMORAL ARTERY EXPLORATION (Right) - with Revision of Popliteal-Peroneal bypass graft using 6mm x 10cm thin wall goretex graft Stable overall. No new cardiac or pulmonary difficulties. Will DC Foley, Swan sleeve today. Will DC heparin and switched to DVT prophylaxis Lovenox. Transfer to unit 2000 and continue to mobilize.   LOS: 7 days   Gayathri Futrell 04/02/2012, 7:50 AM

## 2012-04-02 NOTE — Progress Notes (Signed)
ANTICOAGULATION CONSULT NOTE - Follow Up Consult  Pharmacy Consult for heparin Indication: NSTEMI  Labs:  Basename 04/02/12 0135 04/01/12 1530 04/01/12 0320 03/31/12 0350 03/30/12 1530 03/30/12 1400  HGB -- -- 9.4* 10.1* -- --  HCT -- -- 28.9* 31.2* -- 28.7*  PLT -- -- 276 256 -- 222  APTT -- -- -- -- -- --  LABPROT -- -- -- -- -- --  INR -- -- -- -- -- --  HEPARINUNFRC 0.78* 0.22* 0.29* -- -- --  CREATININE -- -- 0.48* 0.56 0.54 --  CKTOTAL -- -- -- -- -- --  CKMB -- -- -- -- -- --  TROPONINI -- -- -- -- -- --    Assessment: 66yo female now slightly supratherapeutic on heparin after rate increase.  Goal of Therapy:  Heparin level 0.3-0.7 units/ml   Plan:  Will decrease heparin gtt slightly to 1750 units/hr and confirm with additional level.  Colleen Can PharmD BCPS 04/02/2012,2:10 AM

## 2012-04-02 NOTE — Progress Notes (Signed)
    Subjective:  Feels a lot better but still some chest congestion. No chest pain.   Objective:  Vital Signs in the last 24 hours: Temp:  [97.7 F (36.5 C)-98.2 F (36.8 C)] 98.1 F (36.7 C) (09/30 0400) Pulse Rate:  [99-118] 106  (09/30 0600) Resp:  [18-31] 26  (09/30 0600) BP: (110-149)/(60-104) 148/93 mmHg (09/30 0600) SpO2:  [96 %-100 %] 100 % (09/30 0600) Weight:  [71.4 kg (157 lb 6.5 oz)] 71.4 kg (157 lb 6.5 oz) (09/30 0600)  Intake/Output from previous day: 09/29 0701 - 09/30 0700 In: 1463 [P.O.:800; I.V.:603; IV Piggyback:60] Out: 2745 [Urine:2745]  Physical Exam: Pt is alert and oriented, NAD HEENT: normal Neck: JVP - normal, carotids 2+= without bruits Lungs: CTA bilaterally CV: tachy and regular without murmur or gallop Abd: soft, NT, Positive BS, no hepatomegaly Ext: diffuse edema Skin: warm/dry no rash  Lab Results:  Basename 04/02/12 0500 04/01/12 0320  WBC 11.7* 8.7  HGB 10.0* 9.4*  PLT 317 276    Basename 04/02/12 0500 04/01/12 0320  NA 137 140  K 3.0* 3.2*  CL 92* 98  CO2 33* 34*  GLUCOSE 163* 125*  BUN 9 13  CREATININE 0.45* 0.48*   No results found for this basename: TROPONINI:2,CK,MB:2 in the last 72 hours  Tele: sinus tach  Assessment/Plan:  1. Acute mixed systolic and diastolic HF. Continues to improve with IV diuresis. Will replete potassium today and continue IV lasix another 24 hours. Probably switch to PO tomorrow.   2. NSTEMI - Type 2 in setting pulmonary edema. Pt stable without CP. Enzyme trend was flat. OK to stop heparin today from cardiac standpoint and switch to DVT prophylaxis-dose anticoagulation. Continue ASA and beta-blocker. I still think cardiac cath is indicated. I'm out of cath lab until Thursday and could do then if that does not delay discharge. I suspect she is at least 3 days away from discharge at this point anyway. Discussed plan with patient. Continue ASA and beta-blocker at current dose. She is  statin-intolerant.  3. Hypokalemia - related to diuresis. Replete K.  Tonny Bollman, M.D. 04/02/2012, 7:08 AM

## 2012-04-02 NOTE — Progress Notes (Signed)
Dr.Early notified of decrease in B/P, Hespan bolus ordered, will continue to monitor.

## 2012-04-02 NOTE — Progress Notes (Signed)
Patient was transferred from Unit 2300 to Unit 2000, after report was given to receiving RN.  Patient's medications, chart, and personal belongings were all sent with patient.  Family notified of transfer.  Keitha Butte, RN

## 2012-04-03 ENCOUNTER — Telehealth: Payer: Self-pay | Admitting: Cardiovascular Disease

## 2012-04-03 DIAGNOSIS — I214 Non-ST elevation (NSTEMI) myocardial infarction: Secondary | ICD-10-CM

## 2012-04-03 LAB — BASIC METABOLIC PANEL
BUN: 11 mg/dL (ref 6–23)
CO2: 34 mEq/L — ABNORMAL HIGH (ref 19–32)
Chloride: 94 mEq/L — ABNORMAL LOW (ref 96–112)
Creatinine, Ser: 0.42 mg/dL — ABNORMAL LOW (ref 0.50–1.10)
GFR calc Af Amer: >90 mL/min (ref >90)
Glucose, Bld: 199 mg/dL — ABNORMAL HIGH (ref 70–99)

## 2012-04-03 LAB — GLUCOSE, CAPILLARY
Glucose-Capillary: 197 mg/dL — ABNORMAL HIGH (ref 70–99)
Glucose-Capillary: 207 mg/dL — ABNORMAL HIGH (ref 70–99)
Glucose-Capillary: 230 mg/dL — ABNORMAL HIGH (ref 70–99)
Glucose-Capillary: 239 mg/dL — ABNORMAL HIGH (ref 70–99)

## 2012-04-03 MED ORDER — BISACODYL 10 MG RE SUPP
10.0000 mg | Freq: Once | RECTAL | Status: AC
  Administered 2012-04-03: 10 mg via RECTAL
  Filled 2012-04-03: qty 1

## 2012-04-03 MED ORDER — PIOGLITAZONE HCL-METFORMIN HCL 15-500 MG PO TABS
1.0000 | ORAL_TABLET | Freq: Every day | ORAL | Status: DC
  Administered 2012-04-03 – 2012-04-04 (×2): 1 via ORAL

## 2012-04-03 MED ORDER — FUROSEMIDE 40 MG PO TABS
40.0000 mg | ORAL_TABLET | Freq: Two times a day (BID) | ORAL | Status: DC
  Administered 2012-04-03 – 2012-04-04 (×3): 40 mg via ORAL
  Filled 2012-04-03 (×5): qty 1

## 2012-04-03 MED ORDER — METOPROLOL TARTRATE 50 MG PO TABS
50.0000 mg | ORAL_TABLET | Freq: Two times a day (BID) | ORAL | Status: DC
  Administered 2012-04-03 – 2012-04-09 (×12): 50 mg via ORAL
  Filled 2012-04-03 (×16): qty 1

## 2012-04-03 MED ORDER — PIOGLITAZONE HCL-METFORMIN HCL 15-500 MG PO TABS: 1.0000 | ORAL_TABLET | Freq: Every day | ORAL | Status: DC

## 2012-04-03 NOTE — Telephone Encounter (Signed)
Plz return call to patient daughter [name]  [phone] regarding status of pt in hospital!!

## 2012-04-03 NOTE — Progress Notes (Signed)
PROGRESS NOTE  Subjective:   Ms. [name] is a 66 yo with HTN, DM, CAD, PVD who has had a NSTEMI following vascular surgery.  Feeling better.  The plan is for cath on Thursday with Dr. Excell Seltzer.  Objective:    Vital Signs:   Temp:  [98.2 F (36.8 C)-98.5 F (36.9 C)] 98.2 F (36.8 C) (10/01 0525) Pulse Rate:  [105-116] 105  (10/01 0525) Resp:  [18-22] 18  (10/01 0525) BP: (136-150)/(71-93) 145/93 mmHg (10/01 0525) SpO2:  [93 %-99 %] 97 % (10/01 0728)  Last BM Date:  (pre-op)   24-hour weight change: Weight change:   Weight trends: Filed Weights   03/30/12 0600 04/01/12 0500 04/02/12 0600  Weight: 177 lb 14.6 oz (80.7 kg) 163 lb 2.3 oz (74 kg) 157 lb 6.5 oz (71.4 kg)    Intake/Output:  09/30 0701 - 10/01 0700 In: 964 [P.O.:960; IV Piggyback:4] Out: 1100 [Urine:950; Emesis/NG output:150]     Physical Exam: BP 145/93  Pulse 105  Temp 98.2 F (36.8 C) (Oral)  Resp 18  Ht 5\' 4"  (1.626 m)  Wt 157 lb 6.5 oz (71.4 kg)  BMI 27.02 kg/m2  SpO2 97%  General: Vital signs reviewed and noted. Well-developed, well-nourished, in no acute distress; alert, appropriate and cooperative .  Head: Normocephalic, atraumatic.  Eyes: conjunctivae/corneas clear.  EOM's intact.   Throat: normal  Neck: Supple. Normal carotids. No JVD  Lungs:  Clear to auscultation  Heart: Regular rate,  With normal  S1 S2. No murmurs, gallops or rubs. Tachycardic.  Abdomen:  Soft, non-tender, non-distended with normoactive bowel sounds. No hepatomegaly. No rebound/guarding. No abdominal masses.  Extremities: Distal pedal pulses are 2+ .  No edema.    Neurologic: A&O X3, CN II - XII are grossly intact. Generally weak.  Psych: Responds to questions appropriately with normal affect.    Labs: BMET:  Basename 04/03/12 0512 04/02/12 0500  NA 141 137  K 3.2* 3.0*  CL 94* 92*  CO2 34* 33*  GLUCOSE 199* 163*  BUN 11 9  CREATININE 0.42* 0.45*  CALCIUM 9.4 9.2  MG -- --  PHOS -- --    Liver function  tests: No results found for this basename: AST:2,ALT:2,ALKPHOS:2,BILITOT:2,PROT:2,ALBUMIN:2 in the last 72 hours No results found for this basename: LIPASE:2,AMYLASE:2 in the last 72 hours  CBC:  Basename 04/02/12 0500 04/01/12 0320  WBC 11.7* 8.7  NEUTROABS -- --  HGB 10.0* 9.4*  HCT 30.2* 28.9*  MCV 86.0 85.5  PLT 317 276    Cardiac Enzymes: No results found for this basename: CKTOTAL:4,CKMB:4,TROPONINI:4 in the last 72 hours  Coagulation Studies: No results found for this basename: LABPROT:5,INR:5 in the last 72 hours   Tele: sinus tachy  Medications:    Infusions:    . sodium chloride 75 mL/hr at 04/02/12 1606  . DISCONTD: heparin 1,750 Units/hr (04/02/12 0209)    Scheduled Medications:    . ALPRAZolam  0.25 mg Oral Daily  . aspirin  325 mg Oral Daily  . bisacodyl  10 mg Rectal Once  . dexlansoprazole  60 mg Oral BID  . enoxaparin (LOVENOX) injection  40 mg Subcutaneous Q24H  . furosemide  40 mg Intravenous Q12H  . insulin aspart  0-9 Units Subcutaneous Q4H  . levalbuterol  0.63 mg Nebulization QID  . metoprolol tartrate  25 mg Oral BID  . pioglitazone-metformin  1 tablet Oral Q breakfast  . potassium chloride  40 mEq Oral BID  . Vitamin D (Ergocalciferol)  50,000 Units Oral Q7 days  . DISCONTD: levalbuterol  0.63 mg Nebulization Q6H  . DISCONTD: sodium chloride  10-40 mL Intracatheter Q12H    Assessment/ Plan:    CORONARY ATHEROSCLEROSIS NATIVE CORONARY ARTERY (03/12/2009) For cath Thursday. No angina .   Sinus tachy:  Will increase metoprolol to 50 BID.  Change lasix to PO, check BMP tomorrow.   Disposition:  Length of Stay: 8  [name], Montez Hageman., MD, [facility] 04/03/2012, 8:23 AM Office [phone] Pager [phone]

## 2012-04-03 NOTE — Progress Notes (Signed)
Nutrition Follow-up  Intervention:    No nutrition intervention at this time -- pt declined RD to follow for nutrition care plan  Assessment:   Patient extubated 9/27. States her appetite is poor. C/o nausea and vomiting. PO intake 0-50% per flowsheet records. Declining addition of supplements.  Diet Order:  Full Liquids  Meds: Scheduled Meds:   . ALPRAZolam  0.25 mg Oral Daily  . aspirin  325 mg Oral Daily  . bisacodyl  10 mg Rectal Once  . dexlansoprazole  60 mg Oral BID  . enoxaparin (LOVENOX) injection  40 mg Subcutaneous Q24H  . furosemide  40 mg Oral BID  . insulin aspart  0-9 Units Subcutaneous Q4H  . levalbuterol  0.63 mg Nebulization QID  . metoprolol tartrate  50 mg Oral BID  . pioglitazone-metformin  1 tablet Oral Q breakfast  . potassium chloride  40 mEq Oral BID  . Vitamin D (Ergocalciferol)  50,000 Units Oral Q7 days  . DISCONTD: furosemide  40 mg Intravenous Q12H  . DISCONTD: levalbuterol  0.63 mg Nebulization Q6H  . DISCONTD: metoprolol tartrate  25 mg Oral BID  . DISCONTD: pioglitazone-metformin  1 tablet Oral Q breakfast  . DISCONTD: sodium chloride  10-40 mL Intracatheter Q12H   Continuous Infusions:   . sodium chloride 75 mL/hr at 04/02/12 1606  . DISCONTD: heparin 1,750 Units/hr (04/02/12 0209)   PRN Meds:.alum & mag hydroxide-simeth, calcium carbonate, hydrALAZINE, influenza  inactive virus vaccine, labetalol, levalbuterol, morphine injection, nitroGLYCERIN, phenol, prochlorperazine, sodium chloride, traMADol, DISCONTD: fentaNYL, DISCONTD: sodium chloride  Labs:  CMP     Component Value Date/Time   NA 141 04/03/2012 0512   K 3.2* 04/03/2012 0512   CL 94* 04/03/2012 0512   CO2 34* 04/03/2012 0512   GLUCOSE 199* 04/03/2012 0512   BUN 11 04/03/2012 0512   CREATININE 0.42* 04/03/2012 0512   CALCIUM 9.4 04/03/2012 0512   PROT 5.0* 03/27/2012 0341   ALBUMIN 2.5* 03/27/2012 0341   AST 26 03/27/2012 0341   ALT 10 03/27/2012 0341   ALKPHOS 50 03/27/2012 0341   BILITOT 0.3 03/27/2012 0341   GFRNONAA >90 04/03/2012 0512   GFRAA >90 04/03/2012 0512     Intake/Output Summary (Last 24 hours) at 04/03/12 1001 Last data filed at 04/03/12 0900  Gross per 24 hour  Intake    840 ml  Output    775 ml  Net     65 ml    CBG (last 3)   Basename 04/03/12 0756 04/03/12 0423 04/03/12 0009  GLUCAP 207* 197* 230*    Weight Status:  71.4 kg (9/30) -- trending down (diuresis)  Re-estimated needs:  1600-1800 kcals, 85-95 gm protein  Nutrition Dx:  Inadequate Oral Intake now r/t poor appetite as evidenced by PO intake 0-50%, ongoing  New Goal: Oral intake with meals to meet >/= 90% of estimated nutrition needs, unmet  Monitor:  PO intake, weight, labs, I/O's  Kirkland Hun, RD, LDN Pager #: [phone] After-Hours Pager #: [phone]

## 2012-04-03 NOTE — Progress Notes (Signed)
Subjective: Interval History: none.. with some nausea. Little flatus.  Objective: Vital signs in last 24 hours: Temp:  [97.2 F (36.2 C)-98.5 F (36.9 C)] 98.2 F (36.8 C) (10/01 0525) Pulse Rate:  [105-116] 105  (10/01 0525) Resp:  [18-23] 18  (10/01 0525) BP: (136-155)/(71-93) 145/93 mmHg (10/01 0525) SpO2:  [93 %-99 %] 93 % (10/01 0525)  Intake/Output from previous day: 09/30 0701 - 10/01 0700 In: 964 [P.O.:960; IV Piggyback:4] Out: 1100 [Urine:950; Emesis/NG output:150] Intake/Output this shift:    Abdomen and both groin incisions healing quite nicely. No evidence of erythema. 2+ femoral pulses bilaterally. Left foot well-perfused. Right foot demarcating at the distal forefoot onto the toes with cyanosis. She does have some tenderness in the lateral aspect and anterior tibial muscle compartment and her distal calf  Lab Results:  Basename 04/02/12 0500 04/01/12 0320  WBC 11.7* 8.7  HGB 10.0* 9.4*  HCT 30.2* 28.9*  PLT 317 276   BMET  Basename 04/03/12 0512 04/02/12 0500  NA 141 137  K 3.2* 3.0*  CL 94* 92*  CO2 34* 33*  GLUCOSE 199* 163*  BUN 11 9  CREATININE 0.42* 0.45*  CALCIUM 9.4 9.2    Studies/Results: Dg Chest 2 View  03/21/2012  *RADIOLOGY REPORT*  Clinical Data: Preadmission chest radiograph.  No complaints.  CHEST - 2 VIEW  Comparison: None.  Findings: Cardiopericardial silhouette is within normal limits for projection.  Aortic arch atherosclerosis.  Symmetric bilateral pleural apical thickening.  No airspace disease.  No effusion. Small calcified granuloma at the right apex interposed between the right posterior fourth and fifth ribs.  Similar calcified granuloma is present at the left apex.  No definite calcified lymph nodes are identified.  IMPRESSION: No active cardiopulmonary disease.  Old granulomatous disease.  The   Original Report Authenticated By: Andreas Newport, M.D.    Dg Chest Port 1 View  03/31/2012  *RADIOLOGY REPORT*  Clinical Data:  Pulmonary edema.  PORTABLE CHEST - 1 VIEW  Comparison: Chest x-ray 03/31/2012.  Findings: Right IJ Cordis in place with tip in the distal internal jugular vein.  Lung volumes are normal. There is cephalization of the pulmonary vasculature and slight indistinctness of the interstitial markings suggestive of mild pulmonary edema.  No consolidative airspace disease.  No pleural effusions.  Heart size is borderline to mildly enlarged.  Atherosclerosis in the thoracic aorta.  IMPRESSION: 1.  Borderline to mildly enlarged cardiac silhouette with mild interstitial pulmonary edema. 2.  Atherosclerosis.   Original Report Authenticated By: Florencia Reasons, M.D.    Dg Chest Port 1 View  03/31/2012  *RADIOLOGY REPORT*  Clinical Data: Difficulty breathing.  PORTABLE CHEST - 1 VIEW  Comparison: 03/30/2012  Findings: Endotracheal tube and nasogastric tube have been removed. Again noted is a right jugular central line with the tip in the right jugular vein region.  There are increased interstitial densities and increased basilar densities.  Findings are concerning for interstitial edema and pleural effusions.  Heart size is stable.  IMPRESSION: Increasing interstitial pulmonary edema.  Increased basilar densities are suggestive for atelectasis and pleural fluid.   Original Report Authenticated By: Richarda Overlie, M.D.    Dg Chest Port 1 View  03/30/2012  *RADIOLOGY REPORT*  Clinical Data: Evaluate endotracheal tube position  PORTABLE CHEST - 1 VIEW  Comparison: 03/29/2012.  Findings: A right internal jugular introducer sheath is in place. An orogastric tube is in place with the tip located projecting over the region of the gastric fundus.  The  side port is positioned overlying the distal esophagus and this could be advanced up to 5 cm to ensure intragastric positioning of the side port if desired. The endotracheal tube tip is located in the proximal trachea, 6.5 cm above the level of the carina.  Heart and mediastinal contours  are unchanged with mild ectasia of the thoracic aorta with intraaortic calcification.  Pulmonary vascular congestion with interstitial prominence, small left pleural effusion and interstitial septal lines is again most suspicious for interstitial pulmonary edema.  More focal basilar density is seen today at the left lung base with relative clearing of the density seen previously at the right lung base and I suspect that this represents shifting basilar positional alveolar edema combined with atelectasis.  Bony structures remain intact.  IMPRESSION: Lines and tubes as above.  Findings most compatible with interstitial edema with shifting basilar edema pattern and probable left lower lobe volume loss.   Original Report Authenticated By: Bertha Stakes, M.D.    Dg Chest Port 1 View  03/29/2012  *RADIOLOGY REPORT*  Clinical Data: Pulmonary edema  PORTABLE CHEST - 1 VIEW  Comparison: 03/28/2012  Findings: Endotracheal tube tip projects 5.5 cm proximal to the carina.  NG tube descends below the level of the image.  Heart size upper normal to mildly enlarged.  Central vascular congestion. Interstitial and more confluent airspace opacities within the lung bases, mildly increased in the interval.  Right IJ sheath.  No pneumothorax.  Small effusion not excluded on the left.  No interval osseous change.  IMPRESSION: Interstitial and right greater than left airspace opacities, in keeping with pulmonary edema (favored) versus multifocal pneumonia. The airspace opacities have increased mildly in the interval.  Appropriately positioned support devices as above.   Original Report Authenticated By: Waneta Martins, M.D.    Portable Chest Xray  03/28/2012  *RADIOLOGY REPORT*  Clinical Data: 66 year old female intubated.  PORTABLE CHEST - 1 VIEW  Comparison: 0640 hours the same day and earlier.  Findings: Portable semi upright AP view 2338 hours.  And tracheal tube now in place, tip in good position between level of  clavicles and carina.  Enteric tube now in position, courses to the left upper quadrant and tip not included.  Stable right IJ introducer sheath.   Stable cardiac size and mediastinal contours.  Increased confluent opacity at the right lung base superimposed on diffuse increased interstitial markings.  No pneumothorax or large effusion.  IMPRESSION: 1.  Lines and tubes in good position as above. 2.  New confluent right infrahilar opacity could reflect aspiration or pneumonia.   Original Report Authenticated By: Harley Hallmark, M.D.    Dg Chest Port 1 View  03/28/2012  *RADIOLOGY REPORT*  Clinical Data: Postop.  Atelectasis.  PORTABLE CHEST - 1 VIEW  Comparison: 03/27/2012  Findings: Interval extubation and removal of Swan-Ganz catheter and NG tube.  Improving bibasilar aeration with decreasing atelectasis. Heart is borderline in size.  Diffuse interstitial prominence throughout the lungs, possible mild interstitial edema.  IMPRESSION: Improving bibasilar aeration following extubation.  Continued interstitial prominence may reflect interstitial edema.   Original Report Authenticated By: Cyndie Chime, M.D.    Dg Chest Port 1 View  03/27/2012  *RADIOLOGY REPORT*  Clinical Data: Status post aorto bifemoral bypass and umbilical hernia repair.  PORTABLE CHEST - 1 VIEW  Comparison: 03/26/2012 and 03/21/2012.  Findings: 0649 hours.  The endotracheal tube, nasogastric tube and Swan-Ganz catheter are unchanged.  There is increased left lower lobe atelectasis and  a small left pleural effusion.  No pneumothorax or pulmonary edema is seen.  Heart size and mediastinal contours are stable.  Calcifications are noted adjacent to the left shoulder suggesting hydroxyapatite deposition.  IMPRESSION:  1.  Increased left lower lobe atelectasis and small left pleural effusion. 2.  Otherwise stable postoperative chest with stable support system.   Original Report Authenticated By: Gerrianne Scale, M.D.    Dg Chest Portable 1  View  03/26/2012  *RADIOLOGY REPORT*  Clinical Data: Postop central line placement.  PORTABLE CHEST - 1 VIEW  Comparison: 03/21/2012  Findings: A right jugular Swan-Ganz catheter is seen with tip in the proximal right pulmonary artery.  Endotracheal tube and nasogastric tube are seen in appropriate position.  No pneumothorax identified.  Both lungs are clear.  Heart size is stable.  IMPRESSION:  No active lung disease.  No pneumothorax identified.   Original Report Authenticated By: Danae Orleans, M.D.    Dg Abd Portable 1v  03/26/2012  *RADIOLOGY REPORT*  Clinical Data: 66 year old female status post stent placement.  PORTABLE ABDOMEN - 1 VIEW  Comparison: CT 02/03/1812.  Findings: Portable supine view 1746 hours.  Enteric tube partially visible tip at the level of the gastric body.  Mild motion artifact.  Calcified atherosclerosis, severe in the pelvis and about the proximal lower extremities.  Bilateral common iliac artery stent re-identified.  Right external iliac artery stent re- identified.  Partial visualization of a stent in the proximal right lower extremity.  Paucity of bowel gas, blurred by motion. Questionable gas in the bladder, may reflect recent catheter placement.  IMPRESSION: 1.  Advanced pelvic and proximal femoral atherosclerosis with multiple vascular stent in place. 2.  Suggestion of gas within the bladder, query recent Foley placement/removal. 3.  Motion artifact affecting the visualized bowel gas.   Original Report Authenticated By: Harley Hallmark, M.D.    Anti-infectives: Anti-infectives     Start     Dose/Rate Route Frequency Ordered Stop   03/26/12 1745   cefUROXime (ZINACEF) 1.5 g in dextrose 5 % 50 mL IVPB        1.5 g 100 mL/hr over 30 Minutes Intravenous Every 12 hours 03/26/12 1732 03/27/12 0830   03/26/12 1345   cefUROXime (ZINACEF) 750 mg in dextrose 5 % 50 mL IVPB  Status:  Discontinued        750 mg 100 mL/hr over 30 Minutes Intravenous  Once 03/26/12 1342 03/29/12  0746   03/25/12 1452   cefUROXime (ZINACEF) 1.5 g in dextrose 5 % 50 mL IVPB        1.5 g 100 mL/hr over 30 Minutes Intravenous 30 min pre-op 03/25/12 1452 03/26/12 1345          Assessment/Plan: s/p Procedure(s) (LRB) with comments: AORTA BIFEMORAL BYPASS GRAFT (N/A) - Aortic-bifemoral bypass using 14x75mm Hemashield graft .  HERNIA REPAIR UMBILICAL ADULT (N/A) - Removal of Umbilical hernia sac FEMORAL ARTERY EXPLORATION (Right) - with Revision of Popliteal-Peroneal bypass graft using 6mm x 10cm thin wall goretex graft Continued resolution of ileus. Did have a great deal of stool in her colon at the time of surgery last week. We'll try Dulcolax suppository today   LOS: 8 days   [name] 04/03/2012, 7:23 AM

## 2012-04-03 NOTE — Telephone Encounter (Signed)
Left message to call back  

## 2012-04-04 LAB — BASIC METABOLIC PANEL
BUN: 10 mg/dL (ref 6–23)
Creatinine, Ser: 0.5 mg/dL (ref 0.50–1.10)
GFR calc Af Amer: >90 mL/min (ref >90)
GFR calc non Af Amer: >90 mL/min (ref >90)
Glucose, Bld: 141 mg/dL — ABNORMAL HIGH (ref 70–99)
Potassium: 2.9 mEq/L — ABNORMAL LOW (ref 3.5–5.1)

## 2012-04-04 LAB — GLUCOSE, CAPILLARY
Glucose-Capillary: 144 mg/dL — ABNORMAL HIGH (ref 70–99)
Glucose-Capillary: 185 mg/dL — ABNORMAL HIGH (ref 70–99)
Glucose-Capillary: 159 mg/dL — ABNORMAL HIGH (ref 70–99)
Glucose-Capillary: 155 mg/dL — ABNORMAL HIGH (ref 70–99)

## 2012-04-04 MED ORDER — SODIUM CHLORIDE 0.9 % IJ SOLN: 3.0000 mL | INTRAMUSCULAR | Status: DC | PRN

## 2012-04-04 MED ORDER — ASPIRIN 81 MG PO CHEW
324.0000 mg | CHEWABLE_TABLET | ORAL | Status: AC
  Administered 2012-04-05: 324 mg via ORAL
  Filled 2012-04-04: qty 4

## 2012-04-04 MED ORDER — SODIUM CHLORIDE 0.9 % IV SOLN: 250.0000 mL | INTRAVENOUS | Status: DC | PRN

## 2012-04-04 MED ORDER — DIAZEPAM 5 MG PO TABS
5.0000 mg | ORAL_TABLET | ORAL | Status: AC
  Administered 2012-04-05: 5 mg via ORAL
  Filled 2012-04-04: qty 1

## 2012-04-04 MED ORDER — FUROSEMIDE 40 MG PO TABS
40.0000 mg | ORAL_TABLET | Freq: Every day | ORAL | Status: DC
  Filled 2012-04-04 (×2): qty 1

## 2012-04-04 MED ORDER — ASPIRIN 325 MG PO TABS
325.0000 mg | ORAL_TABLET | Freq: Every day | ORAL | Status: DC
  Administered 2012-04-06 – 2012-04-09 (×4): 325 mg via ORAL
  Filled 2012-04-04 (×4): qty 1

## 2012-04-04 MED ORDER — SODIUM CHLORIDE 0.9 % IJ SOLN: 3.0000 mL | Freq: Two times a day (BID) | INTRAMUSCULAR | Status: DC

## 2012-04-04 MED ORDER — SODIUM CHLORIDE 0.9 % IV SOLN: 1.0000 mL/kg/h | INTRAVENOUS | Status: DC

## 2012-04-04 NOTE — Progress Notes (Signed)
    Subjective:  Feeling better. No chest pain or dyspnea.  Objective:  Vital Signs in the last 24 hours: Temp:  [98.2 F (36.8 C)-98.7 F (37.1 C)] 98.3 F (36.8 C) (10/02 0437) Pulse Rate:  [95-109] 95  (10/02 0437) Resp:  [18-20] 18  (10/02 0437) BP: (117-142)/(67-88) 117/67 mmHg (10/02 0437) SpO2:  [87 %-98 %] 98 % (10/02 0437)  Intake/Output from previous day: 10/01 0701 - 10/02 0700 In: 1110 [P.O.:360; I.V.:750] Out: 550 [Urine:550]  Physical Exam: Pt is alert and oriented, NAD HEENT: normal Neck: JVP - normal Lungs: CTA bilaterally CV: RRR without murmur or gallop Abd: soft, NT, Positive BS Ext: left greater than right leg edema  Lab Results:  Basename 04/02/12 0500  WBC 11.7*  HGB 10.0*  PLT 317    Basename 04/04/12 0525 04/03/12 0512  NA 141 141  K 2.9* 3.2*  CL 96 94*  CO2 36* 34*  GLUCOSE 141* 199*  BUN 10 11  CREATININE 0.50 0.42*   No results found for this basename: TROPONINI:2,CK,MB:2 in the last 72 hours  Tele: personally reviewed. Sinus rhythm.  Assessment/Plan:  1. NSTEMI, Type 2. Continue same medication for now. She is stable. CArdiac catheterization tomorrow. Risks, indication reviewed with patient who understands and agrees to proceed. Will aim for left radial access.  2. Acute mixed CHF. Remains stable. Decrease lasix to 40 mg daily. LVEF greater than 40%, but consider add ace/arb. Will await cath tomorrow.  Jacquie Lukes, M.D. 04/04/2012, 8:35 AM     

## 2012-04-04 NOTE — Progress Notes (Signed)
Subjective: Interval History: none.. Nausea resolved. Tolerating liquids. Has been up walking  Objective: Vital signs in last 24 hours: Temp:  [98.2 F (36.8 C)-98.7 F (37.1 C)] 98.3 F (36.8 C) (10/02 0437) Pulse Rate:  [95-105] 95  (10/02 0437) Resp:  [18] 18  (10/02 0437) BP: (117-138)/(67-88) 117/67 mmHg (10/02 0437) SpO2:  [87 %-98 %] 98 % (10/02 0437)  Intake/Output from previous day: 10/01 0701 - 10/02 0700 In: 1110 [P.O.:360; I.V.:750] Out: 550 [Urine:550] Intake/Output this shift:    Physical exam: Abdomen both groin incisions healing well no erythema. 2+ femoral pulses bilaterally. Left foot well perfused. Right foot demarcating around the transmetatarsal level with a sinus cystoscopy this. She does have tenderness over her distal anterior tibial muscle bodies.  Lab Results:  Basename 04/09/12 0500  WBC 11.7*  HGB 10.0*  HCT 30.2*  PLT 317   BMET  Basename 04/04/12 0525 04/03/12 0512  NA 141 141  K 2.9* 3.2*  CL 96 94*  CO2 36* 34*  GLUCOSE 141* 199*  BUN 10 11  CREATININE 0.50 0.42*  CALCIUM 9.0 9.4    Studies/Results: Dg Chest 2 View  03/21/2012  *RADIOLOGY REPORT*  Clinical Data: Preadmission chest radiograph.  No complaints.  CHEST - 2 VIEW  Comparison: None.  Findings: Cardiopericardial silhouette is within normal limits for projection.  Aortic arch atherosclerosis.  Symmetric bilateral pleural apical thickening.  No airspace disease.  No effusion. Small calcified granuloma at the right apex interposed between the right posterior fourth and fifth ribs.  Similar calcified granuloma is present at the left apex.  No definite calcified lymph nodes are identified.  IMPRESSION: No active cardiopulmonary disease.  Old granulomatous disease.  The   Original Report Authenticated By: Andreas Newport, M.D.    Dg Chest Port 1 View  03/31/2012  *RADIOLOGY REPORT*  Clinical Data: Pulmonary edema.  PORTABLE CHEST - 1 VIEW  Comparison: Chest x-ray 03/31/2012.   Findings: Right IJ Cordis in place with tip in the distal internal jugular vein.  Lung volumes are normal. There is cephalization of the pulmonary vasculature and slight indistinctness of the interstitial markings suggestive of mild pulmonary edema.  No consolidative airspace disease.  No pleural effusions.  Heart size is borderline to mildly enlarged.  Atherosclerosis in the thoracic aorta.  IMPRESSION: 1.  Borderline to mildly enlarged cardiac silhouette with mild interstitial pulmonary edema. 2.  Atherosclerosis.   Original Report Authenticated By: Florencia Reasons, M.D.    Dg Chest Port 1 View  03/31/2012  *RADIOLOGY REPORT*  Clinical Data: Difficulty breathing.  PORTABLE CHEST - 1 VIEW  Comparison: 03/30/2012  Findings: Endotracheal tube and nasogastric tube have been removed. Again noted is a right jugular central line with the tip in the right jugular vein region.  There are increased interstitial densities and increased basilar densities.  Findings are concerning for interstitial edema and pleural effusions.  Heart size is stable.  IMPRESSION: Increasing interstitial pulmonary edema.  Increased basilar densities are suggestive for atelectasis and pleural fluid.   Original Report Authenticated By: Richarda Overlie, M.D.    Dg Chest Port 1 View  03/30/2012  *RADIOLOGY REPORT*  Clinical Data: Evaluate endotracheal tube position  PORTABLE CHEST - 1 VIEW  Comparison: 03/29/2012.  Findings: A right internal jugular introducer sheath is in place. An orogastric tube is in place with the tip located projecting over the region of the gastric fundus.  The side port is positioned overlying the distal esophagus and this could be advanced up  to 5 cm to ensure intragastric positioning of the side port if desired. The endotracheal tube tip is located in the proximal trachea, 6.5 cm above the level of the carina.  Heart and mediastinal contours are unchanged with mild ectasia of the thoracic aorta with intraaortic  calcification.  Pulmonary vascular congestion with interstitial prominence, small left pleural effusion and interstitial septal lines is again most suspicious for interstitial pulmonary edema.  More focal basilar density is seen today at the left lung base with relative clearing of the density seen previously at the right lung base and I suspect that this represents shifting basilar positional alveolar edema combined with atelectasis.  Bony structures remain intact.  IMPRESSION: Lines and tubes as above.  Findings most compatible with interstitial edema with shifting basilar edema pattern and probable left lower lobe volume loss.   Original Report Authenticated By: Bertha Stakes, M.D.    Dg Chest Port 1 View  03/29/2012  *RADIOLOGY REPORT*  Clinical Data: Pulmonary edema  PORTABLE CHEST - 1 VIEW  Comparison: 03/28/2012  Findings: Endotracheal tube tip projects 5.5 cm proximal to the carina.  NG tube descends below the level of the image.  Heart size upper normal to mildly enlarged.  Central vascular congestion. Interstitial and more confluent airspace opacities within the lung bases, mildly increased in the interval.  Right IJ sheath.  No pneumothorax.  Small effusion not excluded on the left.  No interval osseous change.  IMPRESSION: Interstitial and right greater than left airspace opacities, in keeping with pulmonary edema (favored) versus multifocal pneumonia. The airspace opacities have increased mildly in the interval.  Appropriately positioned support devices as above.   Original Report Authenticated By: Waneta Martins, M.D.    Portable Chest Xray  03/28/2012  *RADIOLOGY REPORT*  Clinical Data: 66 year old female intubated.  PORTABLE CHEST - 1 VIEW  Comparison: 0640 hours the same day and earlier.  Findings: Portable semi upright AP view 2338 hours.  And tracheal tube now in place, tip in good position between level of clavicles and carina.  Enteric tube now in position, courses to the left  upper quadrant and tip not included.  Stable right IJ introducer sheath.   Stable cardiac size and mediastinal contours.  Increased confluent opacity at the right lung base superimposed on diffuse increased interstitial markings.  No pneumothorax or large effusion.  IMPRESSION: 1.  Lines and tubes in good position as above. 2.  New confluent right infrahilar opacity could reflect aspiration or pneumonia.   Original Report Authenticated By: Harley Hallmark, M.D.    Dg Chest Port 1 View  03/28/2012  *RADIOLOGY REPORT*  Clinical Data: Postop.  Atelectasis.  PORTABLE CHEST - 1 VIEW  Comparison: 03/27/2012  Findings: Interval extubation and removal of Swan-Ganz catheter and NG tube.  Improving bibasilar aeration with decreasing atelectasis. Heart is borderline in size.  Diffuse interstitial prominence throughout the lungs, possible mild interstitial edema.  IMPRESSION: Improving bibasilar aeration following extubation.  Continued interstitial prominence may reflect interstitial edema.   Original Report Authenticated By: Cyndie Chime, M.D.    Dg Chest Port 1 View  03/27/2012  *RADIOLOGY REPORT*  Clinical Data: Status post aorto bifemoral bypass and umbilical hernia repair.  PORTABLE CHEST - 1 VIEW  Comparison: 03/26/2012 and 03/21/2012.  Findings: 0649 hours.  The endotracheal tube, nasogastric tube and Swan-Ganz catheter are unchanged.  There is increased left lower lobe atelectasis and a small left pleural effusion.  No pneumothorax or pulmonary edema is seen.  Heart size and mediastinal contours are stable.  Calcifications are noted adjacent to the left shoulder suggesting hydroxyapatite deposition.  IMPRESSION:  1.  Increased left lower lobe atelectasis and small left pleural effusion. 2.  Otherwise stable postoperative chest with stable support system.   Original Report Authenticated By: Gerrianne Scale, M.D.    Dg Chest Portable 1 View  03/26/2012  *RADIOLOGY REPORT*  Clinical Data: Postop central line  placement.  PORTABLE CHEST - 1 VIEW  Comparison: 03/21/2012  Findings: A right jugular Swan-Ganz catheter is seen with tip in the proximal right pulmonary artery.  Endotracheal tube and nasogastric tube are seen in appropriate position.  No pneumothorax identified.  Both lungs are clear.  Heart size is stable.  IMPRESSION:  No active lung disease.  No pneumothorax identified.   Original Report Authenticated By: Danae Orleans, M.D.    Dg Abd Portable 1v  03/26/2012  *RADIOLOGY REPORT*  Clinical Data: 66 year old female status post stent placement.  PORTABLE ABDOMEN - 1 VIEW  Comparison: CT 02/03/1812.  Findings: Portable supine view 1746 hours.  Enteric tube partially visible tip at the level of the gastric body.  Mild motion artifact.  Calcified atherosclerosis, severe in the pelvis and about the proximal lower extremities.  Bilateral common iliac artery stent re-identified.  Right external iliac artery stent re- identified.  Partial visualization of a stent in the proximal right lower extremity.  Paucity of bowel gas, blurred by motion. Questionable gas in the bladder, may reflect recent catheter placement.  IMPRESSION: 1.  Advanced pelvic and proximal femoral atherosclerosis with multiple vascular stent in place. 2.  Suggestion of gas within the bladder, query recent Foley placement/removal. 3.  Motion artifact affecting the visualized bowel gas.   Original Report Authenticated By: Harley Hallmark, M.D.    Anti-infectives: Anti-infectives     Start     Dose/Rate Route Frequency Ordered Stop   03/26/12 1745   cefUROXime (ZINACEF) 1.5 g in dextrose 5 % 50 mL IVPB        1.5 g 100 mL/hr over 30 Minutes Intravenous Every 12 hours 03/26/12 1732 03/27/12 0830   03/26/12 1345   cefUROXime (ZINACEF) 750 mg in dextrose 5 % 50 mL IVPB  Status:  Discontinued        750 mg 100 mL/hr over 30 Minutes Intravenous  Once 03/26/12 1342 03/29/12 0746   03/25/12 1452   cefUROXime (ZINACEF) 1.5 g in dextrose 5 % 50 mL  IVPB        1.5 g 100 mL/hr over 30 Minutes Intravenous 30 min pre-op 03/25/12 1452 03/26/12 1345          Assessment/Plan: s/p Procedure(s) (LRB) with comments: AORTA BIFEMORAL BYPASS GRAFT (N/A) - Aortic-bifemoral bypass using 14x50mm Hemashield graft .  HERNIA REPAIR UMBILICAL ADULT (N/A) - Removal of Umbilical hernia sac FEMORAL ARTERY EXPLORATION (Right) - with Revision of Popliteal-Peroneal bypass graft using 6mm x 10cm thin wall goretex graft discussed with Dr. Excell Seltzer. For cardiac catheter tomorrow. We'll advance diet. Patient is having bowel movements and mobilizing well. Had a very long discussion with the patient and her husband present. I explained that amputation is inevitable with a below-knee or above-knee position. Feel that it is safe for her to be discharged to home on Friday assuming she continues her usual progress and cardiac issues are uncovered. She will have close followup as an outpatient regarding her right foot.   LOS: 9 days   Vicente Weidler 04/04/2012, 10:54 AM

## 2012-04-05 ENCOUNTER — Encounter (HOSPITAL_COMMUNITY)
Admission: RE | Disposition: A | Discharge status: Home / Self Care | Payer: Self-pay | Source: Ambulatory Visit | Attending: Vascular Surgery

## 2012-04-05 ENCOUNTER — Inpatient Hospital Stay (HOSPITAL_COMMUNITY): Payer: Medicare Other

## 2012-04-05 DIAGNOSIS — I2510 Atherosclerotic heart disease of native coronary artery without angina pectoris: Secondary | ICD-10-CM

## 2012-04-05 HISTORY — PX: LEFT HEART CATHETERIZATION WITH CORONARY ANGIOGRAM: SHX5451

## 2012-04-05 LAB — GLUCOSE, CAPILLARY
Glucose-Capillary: 135 mg/dL — ABNORMAL HIGH (ref 70–99)
Glucose-Capillary: 150 mg/dL — ABNORMAL HIGH (ref 70–99)
Glucose-Capillary: 169 mg/dL — ABNORMAL HIGH (ref 70–99)
Glucose-Capillary: 239 mg/dL — ABNORMAL HIGH (ref 70–99)

## 2012-04-05 LAB — BASIC METABOLIC PANEL
CO2: 34 mEq/L — ABNORMAL HIGH (ref 19–32)
Calcium: 8.4 mg/dL (ref 8.4–10.5)
Potassium: 2.7 mEq/L — CL (ref 3.5–5.1)
Sodium: 139 mEq/L (ref 135–145)

## 2012-04-05 SURGERY — LEFT HEART CATHETERIZATION WITH CORONARY ANGIOGRAM
Anesthesia: LOCAL

## 2012-04-05 MED ORDER — SODIUM CHLORIDE 0.9 % IJ SOLN
3.0000 mL | Freq: Two times a day (BID) | INTRAMUSCULAR | Status: DC
  Administered 2012-04-06: 3 mL via INTRAVENOUS

## 2012-04-05 MED ORDER — POTASSIUM CHLORIDE 10 MEQ/50ML IV SOLN
10.0000 meq | INTRAVENOUS | Status: AC
  Administered 2012-04-06 (×2): 10 meq via INTRAVENOUS
  Filled 2012-04-05 (×2): qty 50

## 2012-04-05 MED ORDER — POTASSIUM CHLORIDE 10 MEQ/100ML IV SOLN
10.0000 meq | INTRAVENOUS | Status: AC
  Administered 2012-04-05 (×3): 10 meq via INTRAVENOUS
  Filled 2012-04-05 (×4): qty 100

## 2012-04-05 MED ORDER — ACETAMINOPHEN 325 MG PO TABS: 650.0000 mg | ORAL_TABLET | ORAL | Status: DC | PRN

## 2012-04-05 MED ORDER — PIOGLITAZONE HCL-METFORMIN HCL 15-500 MG PO TABS
1.0000 | ORAL_TABLET | Freq: Every day | ORAL | Status: DC
  Administered 2012-04-08 – 2012-04-09 (×2): 1 via ORAL

## 2012-04-05 MED ORDER — CEFUROXIME SODIUM 1.5 G IJ SOLR
1.5000 g | Freq: Three times a day (TID) | INTRAMUSCULAR | Status: DC
  Filled 2012-04-05 (×2): qty 1.5

## 2012-04-05 MED ORDER — NITROGLYCERIN 0.2 MG/ML ON CALL CATH LAB
INTRAVENOUS | Status: AC
  Filled 2012-04-05: qty 1

## 2012-04-05 MED ORDER — PIOGLITAZONE HCL 15 MG PO TABS
15.0000 mg | ORAL_TABLET | Freq: Every day | ORAL | Status: AC
  Administered 2012-04-06 – 2012-04-07 (×2): 15 mg via ORAL
  Filled 2012-04-05 (×3): qty 1

## 2012-04-05 MED ORDER — MORPHINE SULFATE 2 MG/ML IJ SOLN
2.0000 mg | INTRAMUSCULAR | Status: DC | PRN
  Administered 2012-04-05: 20:00:00 2 mg via INTRAVENOUS
  Filled 2012-04-05 (×2): qty 1

## 2012-04-05 MED ORDER — DEXTROSE 5 % IV SOLN
1.5000 g | Freq: Three times a day (TID) | INTRAVENOUS | Status: DC
  Administered 2012-04-06 – 2012-04-09 (×11): 1.5 g via INTRAVENOUS
  Filled 2012-04-05 (×12): qty 1.5

## 2012-04-05 MED ORDER — ONDANSETRON HCL 4 MG/2ML IJ SOLN: 4.0000 mg | Freq: Four times a day (QID) | INTRAMUSCULAR | Status: DC | PRN

## 2012-04-05 MED ORDER — HEPARIN (PORCINE) IN NACL 2-0.9 UNIT/ML-% IJ SOLN
INTRAMUSCULAR | Status: AC
  Filled 2012-04-05: qty 1000

## 2012-04-05 MED ORDER — LIDOCAINE HCL (PF) 1 % IJ SOLN
INTRAMUSCULAR | Status: AC
  Filled 2012-04-05: qty 30

## 2012-04-05 MED ORDER — SODIUM CHLORIDE 0.9 % IJ SOLN: 3.0000 mL | INTRAMUSCULAR | Status: DC | PRN

## 2012-04-05 MED ORDER — VERAPAMIL HCL 2.5 MG/ML IV SOLN
INTRAVENOUS | Status: AC
  Filled 2012-04-05: qty 2

## 2012-04-05 MED ORDER — FUROSEMIDE 10 MG/ML IJ SOLN
40.0000 mg | Freq: Once | INTRAMUSCULAR | Status: AC
  Administered 2012-04-05: 40 mg via INTRAVENOUS
  Filled 2012-04-05: qty 4

## 2012-04-05 MED ORDER — SODIUM CHLORIDE 0.9 % IV SOLN
250.0000 mL | INTRAVENOUS | Status: DC | PRN
  Administered 2012-04-06: 250 mL via INTRAVENOUS

## 2012-04-05 MED ORDER — LABETALOL HCL 5 MG/ML IV SOLN
INTRAVENOUS | Status: AC
  Filled 2012-04-05: qty 4

## 2012-04-05 MED ORDER — HEPARIN SODIUM (PORCINE) 1000 UNIT/ML IJ SOLN
INTRAMUSCULAR | Status: AC
  Filled 2012-04-05: qty 1

## 2012-04-05 MED ORDER — MIDAZOLAM HCL 2 MG/2ML IJ SOLN
INTRAMUSCULAR | Status: AC
  Filled 2012-04-05: qty 2

## 2012-04-05 MED ORDER — FENTANYL CITRATE 0.05 MG/ML IJ SOLN
INTRAMUSCULAR | Status: AC
  Filled 2012-04-05: qty 2

## 2012-04-05 NOTE — CV Procedure (Signed)
   Cardiac Catheterization Procedure Note  Name: [name] MRN: [id number] DOB: [birth year]  Procedure: Left Heart Cath, Selective Coronary Angiography, LV angiography, left subclavian angiogram.  Indication: NSTEMI, postoperative   Procedural Details: The left wrist was prepped, draped, and anesthetized with 1% lidocaine. Using the modified Seldinger technique, a 5 French sheath was introduced into the left radial artery. Prior to the procedure, the left-sided Allen's test was negative. However, there were no other access options so I proceeded (fresh Ao-bifem, absent right radial pulse). 3 mg of verapamil was administered through the sheath, weight-based unfractionated heparin was administered intravenously. Standard Judkins catheters were used for selective coronary angiography and left ventriculography. The left subclavian was difficult to navigate and the JR4 catheter was used to take a selective subclavian angiogram. Catheter exchanges were performed over an exchange length guidewire. There were no immediate procedural complications. A TR band was used for radial hemostasis at the completion of the procedure.  The patient was transferred to the post catheterization recovery area for further monitoring.  Procedural Findings: Hemodynamics: AO 185/74 LV 187/27 Left subclavian 135/69  Coronary angiography: Coronary dominance: right  Left mainstem: Calcified but widely patent.  Left anterior descending (LAD): Patent to the left ventricular apex. There is heavy calcification of the proximal LAD with mild nonobstructive diffuse 30-40% plaque. At the distal apical portion of the LAD there is an 80% stenosis. The diagonal branches are widely patent.  Left circumflex (LCx): The left circumflex is moderately calcified. The vessel is widely patent. There is a small intermediate branch with a 75% stenosis noted. The AV groove circumflex is widely patent at the previous stent site. The obtuse  marginal branches and left PDA branch are patent.  Right coronary artery (RCA): The right coronary artery is small and nondominant. The vessel supplies 2 RV marginal branches, both have high grade lesions in the 90% range.  Left ventriculography: Left ventricular systolic function is normal, LVEF is estimated at 65%, there is no significant mitral regurgitation  Left subclavian artery: There is severe stenosis of the proximal left subclavian artery of 75% with associated calcification. The mammary artery is widely patent. The vertebral artery is not well-visualized.   Final Conclusions:   1. Continued patency of the left circumflex stent 2. Mild nonobstructive stenosis of the proximal LAD and proximal left circumflex. 3. Normal LV function with elevated LVEDP 4. 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  Tonny Bollman 04/05/2012, 2:40 PM

## 2012-04-05 NOTE — H&P (View-Only) (Signed)
    Subjective:  Feeling better. No chest pain or dyspnea.  Objective:  Vital Signs in the last 24 hours: Temp:  [98.2 F (36.8 C)-98.7 F (37.1 C)] 98.3 F (36.8 C) (10/02 0437) Pulse Rate:  [95-109] 95  (10/02 0437) Resp:  [18-20] 18  (10/02 0437) BP: (117-142)/(67-88) 117/67 mmHg (10/02 0437) SpO2:  [87 %-98 %] 98 % (10/02 0437)  Intake/Output from previous day: 10/01 0701 - 10/02 0700 In: 1110 [P.O.:360; I.V.:750] Out: 550 [Urine:550]  Physical Exam: Pt is alert and oriented, NAD HEENT: normal Neck: JVP - normal Lungs: CTA bilaterally CV: RRR without murmur or gallop Abd: soft, NT, Positive BS Ext: left greater than right leg edema  Lab Results:  Basename 04/02/12 0500  WBC 11.7*  HGB 10.0*  PLT 317    Basename 04/04/12 0525 04/03/12 0512  NA 141 141  K 2.9* 3.2*  CL 96 94*  CO2 36* 34*  GLUCOSE 141* 199*  BUN 10 11  CREATININE 0.50 0.42*   No results found for this basename: TROPONINI:2,CK,MB:2 in the last 72 hours  Tele: personally reviewed. Sinus rhythm.  Assessment/Plan:  1. NSTEMI, Type 2. Continue same medication for now. She is stable. CArdiac catheterization tomorrow. Risks, indication reviewed with patient who understands and agrees to proceed. Will aim for left radial access.  2. Acute mixed CHF. Remains stable. Decrease lasix to 40 mg daily. LVEF greater than 40%, but consider add ace/arb. Will await cath tomorrow.  Tonny Bollman, M.D. 04/04/2012, 8:35 AM

## 2012-04-05 NOTE — Progress Notes (Signed)
CRITICAL VALUE ALERT  Critical value received:  Potassium 2.9  Date of notification:  04/05/2012  Time of notification:  0620  Critical value read back:yes  Nurse who received alert:  Carlyle Lipa  MD notified (1st page):  Leonides Sake  Time of first page:  0624  MD notified (2nd page):  Time of second page:  Responding MD:  Leonides Sake  Time MD responded:  [phone]

## 2012-04-05 NOTE — Progress Notes (Signed)
Critical lab received for a Potassium of 2.9, Dr. Imogene Burn was on call  for Dr. Arbie Cookey and ordered a repeat lab. No other orders were given. Will continue to monitor.   [name]

## 2012-04-05 NOTE — Progress Notes (Signed)
Pt considerably sob w/ getting up to bsc, HR up to 110 and RR up to 30.  O2 sat drop to 85% on RA when pt removed O2. Pt assisted back to bed and recovered within 5 minutes with O2 sat up to 95% on 3L. This happened twice due to loose black stool (pt states received suppository today).  Received morphine at 2030 for Rt foot pain.  Lungs were clear to [place] in all fields at 2030.  Pt up to bsc third time, pt began to be in resp distress, 02 sat 85% on 3L, HR 110's.  Head and neck drenched in sweat.  Denies CP. Given Xopenex neb.  Very little air movement aus but harsh rhonchi in all fields, RR 34.  Assisted back to bed.  Pt not recovering.  RT and Rapid Response RN called to evaluate.  Placed on facemask.  Dr. Edilia Bo called, orders received.  IV lasix given, IVF turned from 75 cc/hr to 10 cc/hr.  Foley inserted using sterile technique, amber urine return.  CXR completed and results called to Dr. Edilia Bo.  Advised him of K+ 2.7 today and received order for IV replacement due to lasix and because Pt was unable to swallow PO KCL tonight due to gagging and fear of vomiting.   IV team called to draw BMET.  Pt resting more comfortably, RR 30, 100% on 6L per [place], using less accessory muscles, SR 90's.  Close monitoring continues.

## 2012-04-05 NOTE — Interval H&P Note (Signed)
History and Physical Interval Note:  04/05/2012 12:57 PM  [name]  has presented today for surgery, with the diagnosis of Chest pain  The various methods of treatment have been discussed with the patient and family. After consideration of risks, benefits and other options for treatment, the patient has consented to  Procedure(s) (LRB) with comments: LEFT HEART CATHETERIZATION WITH CORONARY ANGIOGRAM (N/A) as a surgical intervention .  The patient's history has been reviewed, patient examined, no change in status, stable for surgery.  I have reviewed the patient's chart and labs.  Questions were answered to the patient's satisfaction.    Procedure discussed with patient and husband. She is hypokalemic and I will replete potassium prior to catheterization.  Tonny Bollman

## 2012-04-05 NOTE — Progress Notes (Signed)
[facility]  Subjective  -  No complaints today   Physical Exam:  abd soft Right foot demarcating       Assessment/Plan:    For heart cath today  [place] IV, V. WELLS 04/05/2012 12:20 PM --  Filed Vitals:   04/05/12 1214  BP: 149/78  Pulse: 83  Temp: 98.1 F (36.7 C)  Resp: 18    Intake/Output Summary (Last 24 hours) at 04/05/12 1220 Last data filed at 04/04/12 2239  Gross per 24 hour  Intake    120 ml  Output      0 ml  Net    120 ml     Laboratory CBC    Component Value Date/Time   WBC 11.7* 04/02/2012 0500   HGB 10.0* 04/02/2012 0500   HCT 30.2* 04/02/2012 0500   PLT 317 04/02/2012 0500    BMET    Component Value Date/Time   NA 139 04/05/2012 0450   K 2.7* 04/05/2012 0450   CL 97 04/05/2012 0450   CO2 34* 04/05/2012 0450   GLUCOSE 149* 04/05/2012 0450   BUN 8 04/05/2012 0450   CREATININE 0.44* 04/05/2012 0450   CALCIUM 8.4 04/05/2012 0450   GFRNONAA >90 04/05/2012 0450   GFRAA >90 04/05/2012 0450    COAG Lab Results  Component Value Date   INR 1.18 03/26/2012   INR 0.93 03/21/2012   INR 0.91 08/22/2011   No results found for this basename: PTT    Antibiotics Anti-infectives     Start     Dose/Rate Route Frequency Ordered Stop   03/26/12 1745   cefUROXime (ZINACEF) 1.5 g in dextrose 5 % 50 mL IVPB        1.5 g 100 mL/hr over 30 Minutes Intravenous Every 12 hours 03/26/12 1732 03/27/12 0830   03/26/12 1345   cefUROXime (ZINACEF) 750 mg in dextrose 5 % 50 mL IVPB  Status:  Discontinued        750 mg 100 mL/hr over 30 Minutes Intravenous  Once 03/26/12 1342 03/29/12 0746   03/25/12 1452   cefUROXime (ZINACEF) 1.5 g in dextrose 5 % 50 mL IVPB        1.5 g 100 mL/hr over 30 Minutes Intravenous 30 min pre-op 03/25/12 1452 03/26/12 1345           V. Charlena Cross, M.D. [facility] Office: [phone] Pager:  [phone]

## 2012-04-06 LAB — BASIC METABOLIC PANEL
Calcium: 8.9 mg/dL (ref 8.4–10.5)
Calcium: 8.4 mg/dL (ref 8.4–10.5)
Creatinine, Ser: 0.48 mg/dL — ABNORMAL LOW (ref 0.50–1.10)
GFR calc Af Amer: >90 mL/min (ref >90)
GFR calc Af Amer: >90 mL/min (ref >90)
GFR calc non Af Amer: >90 mL/min (ref >90)
GFR calc non Af Amer: >90 mL/min (ref >90)
Glucose, Bld: 239 mg/dL — ABNORMAL HIGH (ref 70–99)
Sodium: 136 mEq/L (ref 135–145)
Sodium: 137 mEq/L (ref 135–145)

## 2012-04-06 LAB — GLUCOSE, CAPILLARY
Glucose-Capillary: 181 mg/dL — ABNORMAL HIGH (ref 70–99)
Glucose-Capillary: 242 mg/dL — ABNORMAL HIGH (ref 70–99)
Glucose-Capillary: 206 mg/dL — ABNORMAL HIGH (ref 70–99)

## 2012-04-06 LAB — CBC
HCT: 28.2 % — ABNORMAL LOW (ref 36.0–46.0)
Hemoglobin: 9 g/dL — ABNORMAL LOW (ref 12.0–15.0)
MCHC: 31.9 g/dL (ref 30.0–36.0)
MCV: 85.5 fL (ref 78.0–100.0)
Platelets: 429 10*3/uL — ABNORMAL HIGH (ref 150–400)
RBC: 3.3 MIL/uL — ABNORMAL LOW (ref 3.87–5.11)
RDW: 16.5 % — ABNORMAL HIGH (ref 11.5–15.5)
WBC: 15.9 10*3/uL — ABNORMAL HIGH (ref 4.0–10.5)

## 2012-04-06 MED ORDER — ISOSORBIDE MONONITRATE ER 30 MG PO TB24
30.0000 mg | ORAL_TABLET | Freq: Every day | ORAL | Status: DC
  Administered 2012-04-06 – 2012-04-07 (×2): 30 mg via ORAL
  Filled 2012-04-06 (×4): qty 1

## 2012-04-06 MED ORDER — INSULIN ASPART 100 UNIT/ML ~~LOC~~ SOLN
0.0000 [IU] | Freq: Three times a day (TID) | SUBCUTANEOUS | Status: DC
  Administered 2012-04-07: 3 [IU] via SUBCUTANEOUS
  Administered 2012-04-07: 2 [IU] via SUBCUTANEOUS
  Administered 2012-04-07 (×2): 3 [IU] via SUBCUTANEOUS
  Administered 2012-04-08: 2 [IU] via SUBCUTANEOUS
  Administered 2012-04-08 (×2): 1 [IU] via SUBCUTANEOUS
  Administered 2012-04-08: 3 [IU] via SUBCUTANEOUS
  Administered 2012-04-09: 1 [IU] via SUBCUTANEOUS

## 2012-04-06 MED ORDER — RAMIPRIL 2.5 MG PO CAPS
2.5000 mg | ORAL_CAPSULE | Freq: Every day | ORAL | Status: DC
  Administered 2012-04-06 – 2012-04-09 (×4): 2.5 mg via ORAL
  Filled 2012-04-06 (×5): qty 1

## 2012-04-06 MED ORDER — FUROSEMIDE 10 MG/ML IJ SOLN
40.0000 mg | Freq: Two times a day (BID) | INTRAMUSCULAR | Status: DC
  Administered 2012-04-06 (×2): 40 mg via INTRAVENOUS
  Filled 2012-04-06 (×6): qty 4

## 2012-04-06 MED ORDER — POTASSIUM CHLORIDE 10 MEQ/100ML IV SOLN
10.0000 meq | INTRAVENOUS | Status: AC
  Administered 2012-04-06 (×4): 10 meq via INTRAVENOUS
  Filled 2012-04-06 (×4): qty 100

## 2012-04-06 MED ORDER — POTASSIUM CHLORIDE 10 MEQ/100ML IV SOLN
INTRAVENOUS | Status: AC
  Administered 2012-04-06: 10 meq
  Filled 2012-04-06: qty 100

## 2012-04-06 NOTE — Progress Notes (Signed)
Called around 2300 for patient with shortness of breath. Upon assessment patient in bed short of breath with audible rhonchi. HR 100s RR 30 95% on 50% Venturi mask. Patient has been out of bed to bedside commode and developed shortness of breath, patient assisted back to bed by nursing staff, xopenex treatment given by RT, MD notified. Orders received for IV lasix, foley placement, and chest x-ray. Results called to Dr. Edilia Bo by bedside RN. Patient resting more comfortably and titrated O2 to 6L [place], RR 25. Will continue to monitor, advised bedside RN to call if needed further assistance

## 2012-04-06 NOTE — Progress Notes (Signed)
Dr. Imogene Burn made aware of pt's inability to swallow the [place] 40 Meq. Made aware of previous K 3.5 and that she has been receiving KCL boluses IV. Ordered to hold PO for tonight.

## 2012-04-06 NOTE — Telephone Encounter (Signed)
Will close encounter.  The pt is still hospitalized and I cannot release information to the pt's daughter at this time.

## 2012-04-06 NOTE — Progress Notes (Signed)
    Subjective:  Feels a lot better this am. Had a rough night with shortness of breath and diarrhea. Events of last night reviewed and discussed with RN.  Objective:  Vital Signs in the last 24 hours: Temp:  [97.4 F (36.3 C)-98.1 F (36.7 C)] 97.8 F (36.6 C) (10/04 0810) Pulse Rate:  [83-117] 99  (10/04 0810) Resp:  [16-31] 17  (10/04 0810) BP: (148-152)/(45-78) 149/45 mmHg (10/04 0810) SpO2:  [82 %-100 %] 99 % (10/04 0810)  Intake/Output from previous day: 10/03 0701 - 10/04 0700 In: 979 [P.O.:520; I.V.:305; IV Piggyback:154] Out: 1256 [Urine:1253; Stool:3]  Physical Exam: Pt is alert and oriented, NAD HEENT: normal Neck: JVP - normal Lungs: CTA bilaterally CV: RRR without murmur or gallop Abd: soft, NT, Positive BS Ext: left radial site ok. Mild diffuse edema noted.  Lab Results:  Basename 04/06/12 0500  WBC 15.9*  HGB 9.0*  PLT 429*    Basename 04/06/12 0500 04/05/12 2336  NA 137 136  K 3.5 3.7  CL 97 96  CO2 29 28  GLUCOSE 239* 255*  BUN 8 9  CREATININE 0.48* 0.48*   No results found for this basename: TROPONINI:2,CK,MB:2 in the last 72 hours  Cardiac Studies: CXR: IMPRESSION:  Cardiomegaly with interstitial and patchy areas of asymmetric  airspace disease. Features suggest evolving asymmetric pulmonary  edema. Diffuse infection is also a possibility  Tele: sinus rhythm  Assessment/Plan:  1. Recurrent pulmonary edema (acute diastolic CHF) 2. NSTEMI, Type 2 3. Severe PAD 4. HTN  Pt with diastolic CHF. Important to measure BP in left thigh - this will give most accurate BP. She has significant left subclavian stenosis. Will continue IV furosemide and KCL repletion today and would convert to PO lasix in the am. Will resume imdur and ramipril as she was taking at home. I think she should remain int he hospital at least until tomorrow and would consider another 48 hours to make sure cardiac is stable. She is eager to go home but not ready yet in my  opinion.  Tonny Bollman, M.D. 04/06/2012, 8:37 AM

## 2012-04-06 NOTE — Progress Notes (Signed)
Pt resting quietly, denies complaints.  Resp even & unlabored, 98-100% on 3L per [place].  950 cc u.o. Per foley.  Looks and feels so much more comfortable.

## 2012-04-06 NOTE — Progress Notes (Signed)
CARDIAC REHAB PHASE I   PRE:  Rate/Rhythm: 89 SR  BP:  Supine: 143/52  Sitting:   Standing:    SaO2: 100 5L 98 2L  MODE:  Ambulation: 48 ft   POST:  Rate/Rhythem: 110 ST  BP:  Supine:   Sitting: 152/54  Standing:    SaO2: 94 2L 1015-1125  Assisted X 2 used O2 2L and walker to ambulate. Gait steady with walker. Pt able to walk 48 feet, states that her leg is painful but walking has made it feel better. Pt to recliner after walk with call light in reach. Completed MI and CHF education with pt. She voices understanding. Pt is not appropriate for Outpt. CRP due to impending amputation. Pt uncomfortable in chair and ask to go back to bed. States that she can get comfortable in bed. Placed her in bed with call light in reach.O2 decreased to 1L after walk O2 sats stayed 94-95%. O2 d/c prior to leaving room, reported to RN.    Beatrix Fetters

## 2012-04-06 NOTE — Progress Notes (Signed)
Watery black bm negative for blood per guiac card.

## 2012-04-06 NOTE — Progress Notes (Signed)
Noted order for wheelchair, this CM contacted [facility] rep to confirm that order had been received. Was unable to confirm, with [facility] , stating that they will deliver chair prior to d/c.  Johny Shock RN MPH Case Manager [phone]

## 2012-04-06 NOTE — Progress Notes (Signed)
Cardiac Rehab [phone] Order received for Cardiac Rehab. Events noted from last pm. Please advise when to begin ambulation. Thank you.

## 2012-04-07 LAB — BASIC METABOLIC PANEL
BUN: 6 mg/dL (ref 6–23)
CO2: 32 mEq/L (ref 19–32)
Calcium: 8.7 mg/dL (ref 8.4–10.5)
Chloride: 93 mEq/L — ABNORMAL LOW (ref 96–112)
Creatinine, Ser: 0.44 mg/dL — ABNORMAL LOW (ref 0.50–1.10)
GFR calc Af Amer: >90 mL/min (ref >90)
GFR calc non Af Amer: >90 mL/min (ref >90)
Glucose, Bld: 218 mg/dL — ABNORMAL HIGH (ref 70–99)
Potassium: 3.1 mEq/L — ABNORMAL LOW (ref 3.5–5.1)
Sodium: 135 mEq/L (ref 135–145)

## 2012-04-07 LAB — GLUCOSE, CAPILLARY
Glucose-Capillary: 203 mg/dL — ABNORMAL HIGH (ref 70–99)
Glucose-Capillary: 164 mg/dL — ABNORMAL HIGH (ref 70–99)
Glucose-Capillary: 218 mg/dL — ABNORMAL HIGH (ref 70–99)

## 2012-04-07 MED ORDER — POTASSIUM CHLORIDE 10 MEQ/100ML IV SOLN: 10.0000 meq | INTRAVENOUS | Status: DC

## 2012-04-07 MED ORDER — POTASSIUM CHLORIDE CRYS ER 10 MEQ PO TBCR
10.0000 meq | EXTENDED_RELEASE_TABLET | ORAL | Status: AC
  Administered 2012-04-07 (×6): 10 meq via ORAL
  Filled 2012-04-07 (×7): qty 1

## 2012-04-07 MED ORDER — FUROSEMIDE 40 MG PO TABS
40.0000 mg | ORAL_TABLET | Freq: Every day | ORAL | Status: DC
  Administered 2012-04-07 – 2012-04-09 (×3): 40 mg via ORAL
  Filled 2012-04-07 (×3): qty 1

## 2012-04-07 MED ORDER — POTASSIUM CHLORIDE CRYS ER 10 MEQ PO TBCR
10.0000 meq | EXTENDED_RELEASE_TABLET | Freq: Four times a day (QID) | ORAL | Status: DC
  Filled 2012-04-07 (×4): qty 1

## 2012-04-07 MED ORDER — SODIUM CHLORIDE 0.9 % IJ SOLN
10.0000 mL | INTRAMUSCULAR | Status: DC | PRN
  Administered 2012-04-07 – 2012-04-08 (×2): 10 mL

## 2012-04-07 NOTE — Progress Notes (Addendum)
Subjective:  She feels some better.  Still moderately short of breath.    Objective:  Vital Signs in the last 24 hours: Temp:  [97.6 F (36.4 C)-98.9 F (37.2 C)] 98.8 F (37.1 C) (10/05 0758) Pulse Rate:  [85-104] 95  (10/05 0758) Resp:  [17-24] 24  (10/05 0758) BP: (143-166)/(42-63) 161/62 mmHg (10/05 0758) SpO2:  [92 %-100 %] 100 % (10/05 0445) Weight:  [162 lb 0.6 oz (73.5 kg)] 162 lb 0.6 oz (73.5 kg) (10/05 0445)  Intake/Output from previous day: 10/04 0701 - 10/05 0700 In: 170 [P.O.:100; IV Piggyback:50] Out: 2626 [Urine:2625; Stool:1]   Physical Exam: General: Well developed, well nourished, in no acute distress. Head:  Normocephalic and atraumatic. Lungs:mild exp prolongation, and minimal basilar crackles Heart: Normal S1 and S2.  No murmur, rubs or gallops. Faster rate.   Pulses:   SCD in place.   Extremities: See VVS notes Neurologic: Alert and oriented x 3.    Lab Results:  Basename 04/06/12 0500  WBC 15.9*  HGB 9.0*  PLT 429*    Basename 04/07/12 0600 04/06/12 0500  NA 135 137  K 3.1* 3.5  CL 93* 97  CO2 32 29  GLUCOSE 218* 239*  BUN 6 8  CREATININE 0.44* 0.48*   No results found for this basename: TROPONINI:2,CK,MB:2 in the last 72 hours Hepatic Function Panel No results found for this basename: PROT,ALBUMIN,AST,ALT,ALKPHOS,BILITOT,BILIDIR,IBILI in the last 72 hours No results found for this basename: CHOL in the last 72 hours No results found for this basename: PROTIME in the last 72 hours  Imaging: Dg Chest Port 1 View  04/05/2012  *RADIOLOGY REPORT*  Clinical Data: Shortness of breath with low O2 sats.  PORTABLE CHEST - 1 VIEW  Comparison: 03/31/2012  Findings: 2304 hours. The cardiopericardial silhouette is enlarged. Interval development of patchy asymmetric airspace disease in the left upper lobe and both bases. The cardiopericardial silhouette is enlarged.  Vascular congestion persist.  Right PICC line tip is noted overlying the mid SVC  level. Telemetry leads overlie the chest.  IMPRESSION: Cardiomegaly with interstitial and patchy areas of asymmetric airspace disease.  Features suggest evolving asymmetric pulmonary edema.  Diffuse infection is also a possibility.   Original Report Authenticated By: ERIC A. MANSELL, M.D.       Assessment/Plan:  Patient Active Hospital Problem List: CORONARY ATHEROSCLEROSIS NATIVE CORONARY ARTERY (03/12/2009)   Assessment: see cath report.  Patent stent.  Medical therapy   Plan: medical therapy UNSPECIFIED PERIPHERAL VASCULAR DISEASE (10/11/2008)   Assessment: patient unsure as to short term plan   Plan: continue to follow  Respiratory failure, post-operative (03/26/2012)   Assessment: improved but see xray from Dr. Excell Seltzer    Plan: stable  Hypokalemia    Replace. She will not take PO very well but has agreed to po.  K is 3.1.  Will give four times per day.  Furosemide has changed to oral.  She will likely need an additional day or two in the hospital.       [name], MD, [facility], [facility] 04/07/2012, 8:10 AM

## 2012-04-07 NOTE — Progress Notes (Signed)
[facility] [place]  Daily Progress Note  Assessment/Planning: POD #12 s/p ABF, TE R fem-BK pop, jump to peroneal, UHR, POD #POD #2 s/p Cardiac cath   Cardiac status will keep patient here another day or two, ok to d/c from vascular viewpoint once cardiac status improves  Tsfr to 2000  Already ordered some K runs earlier: will cancel and use PO KDur as rec. By Cardiology  KDur adjustments per Cardiology  Subjective  - 2 Days Post-Op  Pain unchanged, +BM/+F  Objective Filed Vitals:   04/07/12 0050 04/07/12 0200 04/07/12 0445 04/07/12 0758  BP: 159/52  154/61 161/62  Pulse: 96   95  Temp: 98 F (36.7 C)  98.9 F (37.2 C) 98.8 F (37.1 C)  TempSrc: Oral  Oral Oral  Resp: 24  24 24   Height:      Weight:   162 lb 0.6 oz (73.5 kg)   SpO2: 92% 93% 100%     Intake/Output Summary (Last 24 hours) at 04/07/12 0912 Last data filed at 04/07/12 1610  Gross per 24 hour  Intake    390 ml  Output   2026 ml  Net  -1636 ml    PULM  CTAB CV  RRR GI  soft, ND, appropriately TTP, -G/R, +BS VASC  R foot demarcating distal to TMA, all incisions c/d/i  Laboratory CBC    Component Value Date/Time   WBC 15.9* 04/06/2012 0500   HGB 9.0* 04/06/2012 0500   HCT 28.2* 04/06/2012 0500   PLT 429* 04/06/2012 0500    BMET    Component Value Date/Time   NA 135 04/07/2012 0600   K 3.1* 04/07/2012 0600   CL 93* 04/07/2012 0600   CO2 32 04/07/2012 0600   GLUCOSE 218* 04/07/2012 0600   BUN 6 04/07/2012 0600   CREATININE 0.44* 04/07/2012 0600   CALCIUM 8.7 04/07/2012 0600   GFRNONAA >90 04/07/2012 0600   GFRAA >90 04/07/2012 0600    Leonides Sake, MD [facility] [place] Office: [phone] Pager: [phone]  04/07/2012, 9:12 AM

## 2012-04-07 NOTE — Progress Notes (Signed)
CARDIAC REHAB PHASE I   PRE:  Rate/Rhythm: 100SR  BP:  Supine: 154/68  Sitting:   Standing:    SaO2: 100%3L  MODE:  Ambulation: 40 ft   POST:  Rate/Rhythem: 107ST  BP:  Supine: 161/62  Sitting:   Standing:    SaO2: 89-100% RA during walk 0730-0800 Pt only able to walk short distance with rolling walker and asst x 2. Could not increase distance. Leg pain getting a little better. No CP. Tried RA and monitored sats whole walk. Pt was 100% part of walk and only dropped to 89% briefly. Left off oxygen in room to see how pt tolerates. C/o slight SOB during walk. Wanted to see if pt qualified for home oxygen which she did not with walk. Reviewed NTG use. Back to bed at pt's request as she states the chair is uncomfortable. Pt may benefit from PT consult.  Duanne Limerick

## 2012-04-08 ENCOUNTER — Inpatient Hospital Stay (HOSPITAL_COMMUNITY): Payer: Medicare Other

## 2012-04-08 LAB — BASIC METABOLIC PANEL
CO2: 32 mEq/L (ref 19–32)
Chloride: 96 mEq/L (ref 96–112)
Creatinine, Ser: 0.45 mg/dL — ABNORMAL LOW (ref 0.50–1.10)
GFR calc Af Amer: >90 mL/min (ref >90)
Potassium: 3.7 mEq/L (ref 3.5–5.1)

## 2012-04-08 LAB — GLUCOSE, CAPILLARY
Glucose-Capillary: 146 mg/dL — ABNORMAL HIGH (ref 70–99)
Glucose-Capillary: 173 mg/dL — ABNORMAL HIGH (ref 70–99)
Glucose-Capillary: 145 mg/dL — ABNORMAL HIGH (ref 70–99)
Glucose-Capillary: 215 mg/dL — ABNORMAL HIGH (ref 70–99)

## 2012-04-08 LAB — PRO B NATRIURETIC PEPTIDE: Pro B Natriuretic peptide (BNP): 3916 pg/mL — ABNORMAL HIGH (ref 0–125)

## 2012-04-08 LAB — CBC
HCT: 27 % — ABNORMAL LOW (ref 36.0–46.0)
Hemoglobin: 8.8 g/dL — ABNORMAL LOW (ref 12.0–15.0)
MCV: 85.2 fL (ref 78.0–100.0)
RBC: 3.17 MIL/uL — ABNORMAL LOW (ref 3.87–5.11)
RDW: 16.5 % — ABNORMAL HIGH (ref 11.5–15.5)
WBC: 12.3 10*3/uL — ABNORMAL HIGH (ref 4.0–10.5)

## 2012-04-08 MED ORDER — ISOSORBIDE MONONITRATE ER 60 MG PO TB24
60.0000 mg | ORAL_TABLET | Freq: Every day | ORAL | Status: DC
  Administered 2012-04-08 – 2012-04-09 (×2): 60 mg via ORAL
  Filled 2012-04-08 (×2): qty 1

## 2012-04-08 MED ORDER — POTASSIUM CHLORIDE CRYS ER 20 MEQ PO TBCR
20.0000 meq | EXTENDED_RELEASE_TABLET | Freq: Two times a day (BID) | ORAL | Status: DC
  Administered 2012-04-08 (×2): 20 meq via ORAL
  Filled 2012-04-08 (×7): qty 1

## 2012-04-08 NOTE — Progress Notes (Addendum)
[facility] [place]  Daily Progress Note  Assessment/Planning: POD #13 s/p ABF, TE R fem-BK pop, jump to peroneal, UHR, POD #3 s/p Cardiac cath  Cardiac status will keep patient here another day: need to verify K will stabilize on new diuretic and KDur regimen Wean off oxygen D/C foley Home probably tomorrow  Subjective   "want to wash up", +BM/+F  Objective Filed Vitals:   04/07/12 2112 04/07/12 2142 04/08/12 0411 04/08/12 0814  BP: 116/68  109/64   Pulse: 93 89 71   Temp:   97.6 F (36.4 C)   TempSrc:   Oral   Resp:  18 20   Height:      Weight:      SpO2:  96% 97% 94%    Intake/Output Summary (Last 24 hours) at 04/08/12 0841 Last data filed at 04/08/12 0412  Gross per 24 hour  Intake    720 ml  Output    950 ml  Net   -230 ml   PULM CTAB , Center Oxygen CV RRR  GI soft, ND, appropriately TTP, -G/R, +BS , inc c/d/i, foley in place VASC R foot demarcating distal to TMA, all incisions c/d/i  Laboratory CBC    Component Value Date/Time   WBC 12.3* 04/08/2012 0555   HGB 8.8* 04/08/2012 0555   HCT 27.0* 04/08/2012 0555   PLT 433* 04/08/2012 0555    BMET    Component Value Date/Time   NA 136 04/08/2012 0555   K 3.7 04/08/2012 0555   CL 96 04/08/2012 0555   CO2 32 04/08/2012 0555   GLUCOSE 155* 04/08/2012 0555   BUN 7 04/08/2012 0555   CREATININE 0.45* 04/08/2012 0555   CALCIUM 8.5 04/08/2012 0555   GFRNONAA >90 04/08/2012 0555   GFRAA >90 04/08/2012 0555    Leonides Sake, MD [facility] [place] Office: [phone] Pager: [phone]  04/08/2012, 8:41 AM

## 2012-04-08 NOTE — Progress Notes (Signed)
[name]  66 y.o.  female  Subjective: Mild productive cough; no dyspnea; no chest discomfort; continued discomfort in right foot.  Allergy: Codeine; Erythromycin; Hydrocodone-acetaminophen; Hydromorphone; Propoxyphene hcl; Statins; Acetaminophen; Eggs or egg-derived products; Ibuprofen; and Shellfish-derived products  Objective: Vital signs in last 24 hours: Temp:  [97.6 F (36.4 C)-98.3 F (36.8 C)] 97.6 F (36.4 C) (10/06 0411) Pulse Rate:  [71-95] 71  (10/06 0411) Resp:  [18-20] 20  (10/06 0411) BP: (109-137)/(52-68) 109/64 mmHg (10/06 0411) SpO2:  [94 %-97 %] 94 % (10/06 0814)  73.5 kg (162 lb 0.6 oz) Body mass index is 27.81 kg/(m^2).  Weight change:  Last BM Date: 04/05/12  Intake/Output from previous day: 10/05 0701 - 10/06 0700 In: 720 [P.O.:720] Out: 950 [Urine:950]  Total of -3 L over the past 72 hours  Weight: Initial increase of 7 kg; now up approximately back to baseline  General- Well developed; no acute distress Neck- No JVD, bilateral carotid bruits Lungs- clear lung fields except minimal rales; mildly prolonged expiratory phase Cardiovascular- normal PMI; normal S1 and S2 Abdomen- decreased bowel sounds; soft and non-tender without masses or organomegaly Skin- Warm, no significant lesions Extremities- trace edema  Lab Results: Cardiac Markers:  No results found for this basename: TROPONINI:2,CK,MB:2 in the last 72 hours CBC:   Basename 04/08/12 0555 04/06/12 0500  WBC 12.3* 15.9*  HGB 8.8* 9.0*  HCT 27.0* 28.2*  PLT 433* 429*   BMET:  Basename 04/08/12 0555 04/07/12 0600  NA 136 135  K 3.7 3.1*  CL 96 93*  CO2 32 32  GLUCOSE 155* 218*  BUN 7 6  CREATININE 0.45* 0.44*  CALCIUM 8.5 8.7   Hepatic Function:  No results found for this basename: PROT,ALBUMIN,AST,ALT,ALKPHOS,BILITOT,BILIDIR,IBILI in the last 72 hours GFR:  Estimated Creatinine Clearance: 67 ml/min (by C-G formula based on Cr of 0.45). Lipids:  No results found for this  basename: CHOL,TRIG,HDL,LDL in the last 72 hours  EKG: 04/06/12: Normal sinus rhythm; left atrial abnormality; ST-T wave abnormalities consistent with anterolateral ischemia or infarction; QT prolongation.  Imaging Studies/Results: CXR 04/05/12: Patchy asymmetric airspace disease, cardiomegaly, vascular congestion  Imaging: Imaging results have been reviewed  Medications:  I have reviewed the patient's current medications. Scheduled:   . ALPRAZolam  0.25 mg Oral Daily  . aspirin  325 mg Oral Daily  . cefUROXime (ZINACEF)  IV  1.5 g Intravenous Q8H  . dexlansoprazole  60 mg Oral BID  . enoxaparin (LOVENOX) injection  40 mg Subcutaneous Q24H  . furosemide  40 mg Oral Daily  . insulin aspart  0-9 Units Subcutaneous TID AC & HS  . isosorbide mononitrate  30 mg Oral Daily  . levalbuterol  0.63 mg Nebulization QID  . metoprolol tartrate  50 mg Oral BID  . pioglitazone-metformin  1 tablet Oral Q breakfast  . potassium chloride  10 mEq Oral Q2H  . potassium chloride  20 mEq Oral BID  . ramipril  2.5 mg Oral Daily  . Vitamin D (Ergocalciferol)  50,000 Units Oral Q7 days  . DISCONTD: potassium chloride  10 mEq Oral QID    Assessment/Plan: Hypokalemia-corrected Congestive heart failure-attributable to diastolic dysfunction and fluid overload; origin of her impressive EKG abnormalities not clear. Chest x-ray and BNP level will be repeated. Daily weights requested.  Minimal dose of nitrates will be titrated upwards although benefit is uncertain. Peripheral vascular disease: Patient told that amputation is likely; she will need close preoperative followup, but cardiac status should tolerate this surgery. Hypertension:  Blood pressure initially mildly elevated; now control is good.    LOS: 13 days   [place] Bing 04/08/2012, 9:12 AM

## 2012-04-09 LAB — BASIC METABOLIC PANEL
Calcium: 9 mg/dL (ref 8.4–10.5)
GFR calc Af Amer: >90 mL/min (ref >90)
GFR calc non Af Amer: >90 mL/min (ref >90)
Potassium: 4.1 mEq/L (ref 3.5–5.1)
Sodium: 134 mEq/L — ABNORMAL LOW (ref 135–145)

## 2012-04-09 LAB — GLUCOSE, CAPILLARY

## 2012-04-09 MED ORDER — TRAMADOL HCL 50 MG PO TABS: 50.0000 mg | ORAL_TABLET | Freq: Four times a day (QID) | ORAL | Status: DC | PRN

## 2012-04-09 MED ORDER — POTASSIUM CHLORIDE CRYS ER 20 MEQ PO TBCR: 10.0000 meq | EXTENDED_RELEASE_TABLET | Freq: Two times a day (BID) | ORAL | Status: DC

## 2012-04-09 MED ORDER — ISOSORBIDE MONONITRATE ER 60 MG PO TB24: 60.0000 mg | ORAL_TABLET | Freq: Every day | ORAL | Status: DC

## 2012-04-09 MED ORDER — LEVOFLOXACIN 500 MG PO TABS: 500.0000 mg | ORAL_TABLET | Freq: Every day | ORAL | Status: DC

## 2012-04-09 MED ORDER — FUROSEMIDE 40 MG PO TABS: 40.0000 mg | ORAL_TABLET | Freq: Every day | ORAL | Status: DC

## 2012-04-09 MED ORDER — POTASSIUM CHLORIDE CRYS ER 20 MEQ PO TBCR: 20.0000 meq | EXTENDED_RELEASE_TABLET | Freq: Every day | ORAL | Status: DC

## 2012-04-09 MED ORDER — METOPROLOL TARTRATE 50 MG PO TABS: 50.0000 mg | ORAL_TABLET | Freq: Two times a day (BID) | ORAL | Status: DC

## 2012-04-09 MED ORDER — POTASSIUM CHLORIDE ER 10 MEQ PO TBCR: 10.0000 meq | EXTENDED_RELEASE_TABLET | Freq: Two times a day (BID) | ORAL | Status: DC

## 2012-04-09 MED ORDER — RAMIPRIL 1.25 MG PO CAPS: 2.5000 mg | ORAL_CAPSULE | Freq: Every day | ORAL | Status: DC

## 2012-04-09 NOTE — Discharge Summary (Signed)
[facility] Discharge Summary   Patient ID:  [name] MRN: [id number] DOB/AGE: August 10, 1945 66 y.o.  Admit date: 03/26/2012 Discharge date: 04/09/2012 Date of Surgery: 03/26/2012 - Dr. Tawanna Cooler Early 1. Aortobifemoral bypass with 14 x 18 the shield graft with reimplantation of accessory left renal artery  2. Thrombectomy of right femoral to below knee popliteal Gore-Tex graft extension to the peroneal artery with 6 mm Gore-Tex  3. Umbilical hernia repair   04/05/2012 Left Heart Cath, Selective Coronary Angiography, LV angiography, left subclavian angiogram.  : Surgeon(s): Tonny Bollman, MD  Admission Diagnosis: PVD Chest pain  Discharge Diagnoses:  PVD Chest pain  Secondary Diagnoses: Past Medical History  Diagnosis Date  . Hypertension     Unspecified  . Hyperlipidemia     Mixed  . Tobacco abuse     Remote  . Loose, teeth     has loose bridge and two loose teeth holding it  . PONV (postoperative nausea and vomiting)   . Type II diabetes mellitus   . GERD (gastroesophageal reflux disease)   . Anxiety   . Peripheral vascular disease     unspecified, a. s/p L CEA b. s/p B fem-pop bypass  . Raynaud disease   . Stones in the urinary tract   . UTI (urinary tract infection)   . Anemia     hx  . Coronary artery disease     a. s/p NSTEMI 02/2009 - PCI LCX with Xience DES. Otherwise branch vessel and Dist. RCA dzs. NL EF.     Procedure(s):  03/26/2012 - Dr. Tawanna Cooler Early 1. Aortobifemoral bypass with 14 x 18 the shield graft with reimplantation of accessory left renal artery  2. Thrombectomy of right femoral to below knee popliteal Gore-Tex graft extension to the peroneal artery with 6 mm Gore-Tex  3. Umbilical hernia repair  04/05/2012 LEFT HEART CATHETERIZATION WITH CORONARY ANGIOGRAM  Discharged Condition: good  HPI:  [name] is a 66 y.o. female patient presented to office for continued discussion of her lower surety arterial insufficiency.  She has an extremely extensive past history. She has undergone staged bilateral femoral-popliteal bypasses and multiple interventions in her iliac arteries with the angioplasty and stenting bilaterally. She had a right external iliac artery plasty and stenting in March and July of this year. She's had very early recurrent stenoses in these. He underwent noninvasive studies and my last visit with her in our office on 02/20/2012 showing systolic velocities at in the 409 cm/s and greater in both external iliac arteries above her femoropopliteal bypasses. That time her right arm index on the right was 0.38 and was 0.65 on the left. She has returned from a trip with her husband to [place] and reports she is having progressively severe difficulty with walking and has severe right leg rest pain. She does have some very superficial ulceration on her right great toe but no other tissue loss. 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  Dr Arbie Cookey discussed the magnitude of aortofemoral bypass grafting including a 2-3% chance of mortality and a 10-15% major morbidity to include cardiac pulmonary renal or other. Dr Early explained the low risk of infection and a devastating consequences of this. They understand and wish to proceed with surgery. We have scheduled this for 03/26/2012. She will see Dr. Excell Seltzer for cardiac clearance prior to this   Hospital Course:  [name] is a 66 y.o. female is S/P . Aortobifemoral bypass  and Thrombectomy of right femoral to below knee popliteal Gore-Tex graft extension to the peroneal artery with 6 mm Gore-Tex. There was a concern regarding hypoperfusion to the right foot secondary to severe tibial occlusive disease and minimal runoff to the foot. Extubated: POD # 03/27/12 On 9/25 pt was re intubated for Resp insufficiency/ flash pulm edema Pt Troponin was high and she was seen by cardiology for Elevated Troponin/NSTEMI She had a  2D Echo 9/26: Study Conclusions  - Left ventricle: Poor endocardial definiion but septum and apex appear hypokinetic The cavity size was mildly dilated. Wall thickness was normal. Systolic function was mildly to moderately reduced. The estimated ejection fraction was in the range of 40% to 45%. - Mitral valve: Calcified annulus. Mildly thickened leaflets  On 03/30/12 she was again extubated and was stable without CP or SOB On 04/06/11 pt had LEFT HEART CATHETERIZATION WITH CORONARY ANGIOGRAM Final Conclusions:  1. Continued patency of the left circumflex stent  2. Mild nonobstructive stenosis of the proximal LAD and proximal left circumflex.  3. Normal LV function with elevated LVEDP  4. 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   Post-op wounds healing well Pt. voiding and taking PO diet without difficulty. Ambulating with walker Right distal foot demarcating with weak PT pulse per doppler Pt pain controlled with PO pain meds. Labs as below Complications:Resp failure with Pulm edema NSTEMI  Consults:  CCM  Treatment Team:  Rounding Lbcardiology, MD Nada Libman, MD  Significant Diagnostic Studies: CBC Lab Results  Component Value Date   WBC 12.3* 04/08/2012   HGB 8.8* 04/08/2012   HCT 27.0* 04/08/2012   MCV 85.2 04/08/2012   PLT 433* 04/08/2012    BMET    Component Value Date/Time   NA 134* 04/09/2012 0400   K 4.1 04/09/2012 0400   CL 94* 04/09/2012 0400   CO2 31 04/09/2012 0400   GLUCOSE 130* 04/09/2012 0400   BUN 6 04/09/2012 0400   CREATININE 0.50 04/09/2012 0400   CALCIUM 9.0 04/09/2012 0400   GFRNONAA >90 04/09/2012 0400   GFRAA >90 04/09/2012 0400   COAG Lab Results  Component Value Date   INR 1.18 03/26/2012   INR 0.93 03/21/2012   INR 0.91 08/22/2011     Disposition:  Discharge to :Home Discharge Orders    Future Appointments: Provider: Department: Dept Phone: Center:   05/22/2012  10:00 AM [facility] [place] [phone] [facility]   05/22/2012 10:30 AM [facility] [phone] [facility]   05/22/2012 11:00 AM Larina Earthly, MD [facility] [phone] [facility]     Future Orders Please Complete By Expires   Resume previous diet      Call MD for:  temperature >100.5      Call MD for:  redness, tenderness, or signs of infection (pain, swelling, bleeding, redness, odor or green/yellow discharge around incision site)      Call MD for:  severe or increased pain, loss or decreased feeling  in affected limb(s)      Increase activity slowly      Comments:   Walk with assistance use walker or cane as needed   Walk with assistance      Walker       May shower       No dressing needed      may wash over wound with mild soap and water      ABDOMINAL PROCEDURE/ANEURYSM REPAIR/AORTO-BIFEMORAL BYPASS:  Call MD  for increased abdominal pain; cramping diarrhea; nausea/vomiting         [place], [place] Medication Instructions ZOX:[id number]   Printed on:04/09/12 0951  Medication Information                    pioglitazone-metformin (ACTOPLUS MET) 15-500 MG per tablet Take 1 tablet by mouth daily. Brand name only-does not work if components are given separately per patient           ALPRAZolam (XANAX) 0.25 MG tablet Take 0.25 mg by mouth daily.            glyBURIDE (DIABETA) 2.5 MG tablet Take 2.5 mg by mouth every morning.            sitaGLIPtan (JANUVIA) 100 MG tablet Take 100 mg by mouth at bedtime.            omega-3 acid ethyl esters (LOVAZA) 1 G capsule Take 2 g by mouth 2 (two) times daily.            nitroGLYCERIN (NITROSTAT) 0.4 MG SL tablet Place 0.4 mg under the tongue every 5 (five) minutes as needed. For chest pain           calcium carbonate (TUMS) 500 MG chewable tablet Chew 1-4 tablets by mouth daily as needed. For acid reflux. Pt can take 1 to 4 tabs as needed for reflux           dexlansoprazole (DEXILANT) 60 MG capsule Take 60 mg by  mouth 2 (two) times daily.           prochlorperazine (COMPAZINE) 10 MG tablet Take 10 mg by mouth every 6 (six) hours as needed. For nausea           Probiotic Product (ACIDOPHILUS PEARLS PO) Take 1 capsule by mouth daily.           iron polysaccharides (NIFEREX) 150 MG capsule Take 150 mg by mouth 2 (two) times daily.           meperidine (DEMEROL) 50 MG tablet Take 25-50 mg by mouth every 4 (four) hours as needed. For pain           Cholecalciferol 25000 UNITS CAPS Take 25,000 Units by mouth 3 (three) times a week. Monday, Wednesday, friday           aspirin 325 MG EC tablet Take 325 mg by mouth daily.           furosemide (LASIX) 40 MG tablet Take 1 tablet (40 mg total) by mouth daily.           isosorbide mononitrate (IMDUR) 60 MG 24 hr tablet Take 1 tablet (60 mg total) by mouth daily.           metoprolol (LOPRESSOR) 50 MG tablet Take 1 tablet (50 mg total) by mouth 2 (two) times daily.           potassium chloride SA (K-DUR,KLOR-CON) 10 MEQ tablet Take 1 tablets (10 mEq total) by mouth 2 (two) times daily.           ramipril (ALTACE) 1.25 MG capsule Take 2 capsules (2.5 mg total) by mouth daily.           traMADol (ULTRAM) 50 MG tablet Take 1 tablet (50 mg total) by mouth every 6 (six) hours as needed.           levofloxacin (LEVAQUIN) 500 MG tablet Take 1 tablet (500 mg total) by mouth daily.  Verbal and written Discharge instructions given to the patient. Wound care per Discharge AVS Follow-up Information    Follow up with EARLY, TODD, MD. In 1 week. (office will arrange-sent)    Contact information:   [address] [place] [address] [phone]       Follow up with Tonny Bollman, MD. (office will arrange appt)    Contact information:   [address] [place] [address] [phone]          Signed: Marlowe [place] 04/09/2012, 9:51 AM

## 2012-04-09 NOTE — Progress Notes (Signed)
    Subjective:  Feels much better. No chest pain or dyspnea.  Objective:  Vital Signs in the last 24 hours: Temp:  [97.2 F (36.2 C)-98.7 F (37.1 C)] 98.5 F (36.9 C) (10/07 0421) Pulse Rate:  [81-106] 98  (10/07 0421) Resp:  [18-19] 18  (10/07 0421) BP: (95-130)/(61-75) 103/64 mmHg (10/07 0421) SpO2:  [87 %-97 %] 97 % (10/07 0820) Weight:  [70.2 kg (154 lb 12.2 oz)] 70.2 kg (154 lb 12.2 oz) (10/07 0421)  Intake/Output from previous day: 10/06 0701 - 10/07 0700 In: 480 [P.O.:480] Out: 651 [Urine:650; Stool:1]  Physical Exam: Pt is alert and oriented, NAD HEENT: normal Neck: JVP - normal Lungs: CTA bilaterally CV: RRR without murmur or gallop Abd: soft, NT, Positive BS Ext: mild lower extremity edema  Lab Results:  Basename 04/08/12 0555  WBC 12.3*  HGB 8.8*  PLT 433*    Basename 04/09/12 0400 04/08/12 0555  NA 134* 136  K 4.1 3.7  CL 94* 96  CO2 31 32  GLUCOSE 130* 155*  BUN 6 7  CREATININE 0.50 0.45*   No results found for this basename: TROPONINI:2,CK,MB:2 in the last 72 hours  Tele: sinus rhythm, no significant arrhythmia  Assessment/Plan:  1. Acute pulmonary edema - resolved 2. CAD, nonobstructive by cath this admission 3. PAD s/p aortobifem, residual limb ischemia per vasc surgery service 4. HTN 5. Hypokalemia  Stable for discharge from cardiac standpoint. Would decrease K-Dur to 20 meq daily. Otherwise same cardiac meds at discharge. Will arrange follow-up in next few weeks.  Tonny Bollman, M.D. 04/09/2012, 8:33 AM

## 2012-04-09 NOTE — Progress Notes (Signed)
[facility]  Subjective  -   No complaints this morning   Physical Exam:  Midline incision is well-healed as are both groin incisions in the right below knee incision. Right foot is demarcating at the base of the toes.       Assessment/Plan:   The patient was kept over the weekend secondary to her cardiopulmonary status. She is been cleared for discharge per cardiology. We will try to wean her off of her oxygen and anticipate sending her home later today. She will followup with Dr. early in the office in 2 weeks  BRABHAM IV, V. WELLS 04/09/2012 10:05 AM --  Ceasar Mons Vitals:   04/09/12 0421  BP: 103/64  Pulse: 98  Temp: 98.5 F (36.9 C)  Resp: 18    Intake/Output Summary (Last 24 hours) at 04/09/12 1005 Last data filed at 04/09/12 0830  Gross per 24 hour  Intake    440 ml  Output    851 ml  Net   -411 ml     Laboratory CBC    Component Value Date/Time   WBC 12.3* 04/08/2012 0555   HGB 8.8* 04/08/2012 0555   HCT 27.0* 04/08/2012 0555   PLT 433* 04/08/2012 0555    BMET    Component Value Date/Time   NA 134* 04/09/2012 0400   K 4.1 04/09/2012 0400   CL 94* 04/09/2012 0400   CO2 31 04/09/2012 0400   GLUCOSE 130* 04/09/2012 0400   BUN 6 04/09/2012 0400   CREATININE 0.50 04/09/2012 0400   CALCIUM 9.0 04/09/2012 0400   GFRNONAA >90 04/09/2012 0400   GFRAA >90 04/09/2012 0400    COAG Lab Results  Component Value Date   INR 1.18 03/26/2012   INR 0.93 03/21/2012   INR 0.91 08/22/2011   No results found for this basename: PTT    Antibiotics Anti-infectives     Start     Dose/Rate Route Frequency Ordered Stop   04/09/12 0000   levofloxacin (LEVAQUIN) 500 MG tablet        500 mg Oral Daily 04/09/12 0950     04/06/12 0000   cefUROXime (ZINACEF) 1.5 g in dextrose 5 % 50 mL IVPB        1.5 g 100 mL/hr over 30 Minutes Intravenous 3 times per day 04/05/12 2353     04/05/12 2359   cefUROXime (ZINACEF) injection 1.5 g  Status:   Discontinued        1.5 g Intravenous 3 times per day 04/05/12 2338 04/05/12 2353   03/26/12 1745   cefUROXime (ZINACEF) 1.5 g in dextrose 5 % 50 mL IVPB        1.5 g 100 mL/hr over 30 Minutes Intravenous Every 12 hours 03/26/12 1732 03/27/12 0830   03/26/12 1345   cefUROXime (ZINACEF) 750 mg in dextrose 5 % 50 mL IVPB  Status:  Discontinued        750 mg 100 mL/hr over 30 Minutes Intravenous  Once 03/26/12 1342 03/29/12 0746   03/25/12 1452   cefUROXime (ZINACEF) 1.5 g in dextrose 5 % 50 mL IVPB        1.5 g 100 mL/hr over 30 Minutes Intravenous 30 min pre-op 03/25/12 1452 03/26/12 1345           V. Charlena Cross, M.D. [facility] Office: [phone] Pager:  [phone]

## 2012-04-16 ENCOUNTER — Encounter: Payer: Self-pay | Admitting: Vascular Surgery

## 2012-04-17 ENCOUNTER — Ambulatory Visit (INDEPENDENT_AMBULATORY_CARE_PROVIDER_SITE_OTHER): Payer: Medicare Other | Admitting: Vascular Surgery

## 2012-04-17 ENCOUNTER — Encounter: Payer: Self-pay | Admitting: Vascular Surgery

## 2012-04-17 VITALS — BP 163/76 | HR 92 | Resp 16 | Ht 64.0 in | Wt 149.5 lb

## 2012-04-17 DIAGNOSIS — I70219 Atherosclerosis of native arteries of extremities with intermittent claudication, unspecified extremity: Secondary | ICD-10-CM

## 2012-04-17 NOTE — Progress Notes (Signed)
The patient has today for followup of a recent extensive hospitalization for attempted limb salvage. She underwent aortobifemoral bypass and attempted revascularization into her peroneal artery on the right leg. She had a postoperative complication with pulmonary edema and reintubation and elevated cardiac enzymes. She underwent a cardiac catheterization prior to discharge showing no evidence of triple coronary disease. She did have ongoing profound ischemia of her right foot with no outflow into her foot with discussion of need for amputation. The patient I wanted to be discharged with some time interval. I explained prior to discharge at the 2 factors that would necessitate a dictation would be progressive tissue loss or intolerable pain. She is having significant right foot rest pain and is treating this with oral Demerol.  Past Medical History  Diagnosis Date  . Hypertension     Unspecified  . Hyperlipidemia     Mixed  . Tobacco abuse     Remote  . Loose, teeth     has loose bridge and two loose teeth holding it  . PONV (postoperative nausea and vomiting)   . Type II diabetes mellitus   . GERD (gastroesophageal reflux disease)   . Anxiety   . Peripheral vascular disease     unspecified, a. s/p L CEA b. s/p B fem-pop bypass  . Raynaud disease   . Stones in the urinary tract   . UTI (urinary tract infection)   . Anemia     hx  . Coronary artery disease     a. s/p NSTEMI 02/2009 - PCI LCX with Xience DES. Otherwise branch vessel and Dist. RCA dzs. NL EF.     History  Substance Use Topics  . Smoking status: Former Smoker -- 2.0 packs/day for 39 years    Types: Cigarettes    Quit date: 07/05/2001  . Smokeless tobacco: Never Used  . Alcohol Use: 7.0 oz/week    14 drink(s) per week    Family History  Problem Relation Age of Onset  . Heart attack Mother   . Heart attack Father   . Hypertension Brother   . Diabetes Brother   . Hypertension Brother     Allergies  Allergen  Reactions  . Codeine Nausea And Vomiting  . Erythromycin Nausea And Vomiting  . Hydrocodone-Acetaminophen Nausea And Vomiting  . Hydromorphone Nausea And Vomiting  . Propoxyphene Hcl Nausea And Vomiting  . Statins Other (See Comments)    Leg cramps  . Acetaminophen Other (See Comments)    Does not tolerate well, nausea  . Eggs Or Egg-Derived Products     "pt does not eat"  . Ibuprofen Other (See Comments)    Does not tolerate well  . Shellfish-Derived Products     "pt does not eat"    Current outpatient prescriptions:ALPRAZolam (XANAX) 0.25 MG tablet, Take 0.25 mg by mouth daily. , Disp: , Rfl: ;  aspirin 325 MG EC tablet, Take 325 mg by mouth daily., Disp: , Rfl: ;  calcium carbonate (TUMS) 500 MG chewable tablet, Chew 1-4 tablets by mouth daily as needed. For acid reflux. Pt can take 1 to 4 tabs as needed for reflux, Disp: , Rfl:  Cholecalciferol 25000 UNITS CAPS, Take 25,000 Units by mouth 3 (three) times a week. Monday, Wednesday, friday, Disp: , Rfl: ;  dexlansoprazole (DEXILANT) 60 MG capsule, Take 60 mg by mouth 2 (two) times daily., Disp: , Rfl: ;  furosemide (LASIX) 40 MG tablet, Take 1 tablet (40 mg total) by mouth daily., Disp: 30 tablet,   Rfl: 1;  glyBURIDE (DIABETA) 2.5 MG tablet, Take 2.5 mg by mouth every morning. , Disp: , Rfl:  iron polysaccharides (NIFEREX) 150 MG capsule, Take 150 mg by mouth 2 (two) times daily., Disp: , Rfl: ;  isosorbide mononitrate (IMDUR) 60 MG 24 hr tablet, Take 1 tablet (60 mg total) by mouth daily., Disp: 30 tablet, Rfl: 1;  levofloxacin (LEVAQUIN) 500 MG tablet, Take 1 tablet (500 mg total) by mouth daily., Disp: 7 tablet, Rfl: 0;  meperidine (DEMEROL) 50 MG tablet, Take 25-50 mg by mouth every 4 (four) hours as needed. For pain, Disp: , Rfl:  metoprolol (LOPRESSOR) 50 MG tablet, Take 1 tablet (50 mg total) by mouth 2 (two) times daily., Disp: 60 tablet, Rfl: 1;  nitroGLYCERIN (NITROSTAT) 0.4 MG SL tablet, Place 0.4 mg under the tongue every 5 (five)  minutes as needed. For chest pain, Disp: , Rfl: ;  omega-3 acid ethyl esters (LOVAZA) 1 G capsule, Take 2 g by mouth 2 (two) times daily. , Disp: , Rfl:  pioglitazone-metformin (ACTOPLUS MET) 15-500 MG per tablet, Take 1 tablet by mouth daily. Brand name only-does not work if components are given separately per patient, Disp: , Rfl: ;  potassium chloride (K-DUR) 10 MEQ tablet, Take 1 tablet (10 mEq total) by mouth 2 (two) times daily., Disp: 60 tablet, Rfl: 1;  Probiotic Product (ACIDOPHILUS PEARLS PO), Take 1 capsule by mouth daily., Disp: , Rfl:  prochlorperazine (COMPAZINE) 10 MG tablet, Take 10 mg by mouth every 6 (six) hours as needed. For nausea, Disp: , Rfl: ;  ramipril (ALTACE) 1.25 MG capsule, Take 2 capsules (2.5 mg total) by mouth daily., Disp: , Rfl: ;  sitaGLIPtan (JANUVIA) 100 MG tablet, Take 100 mg by mouth at bedtime. , Disp: , Rfl: ;  traMADol (ULTRAM) 50 MG tablet, Take 1 tablet (50 mg total) by mouth every 6 (six) hours as needed., Disp: 40 tablet, Rfl: 0  BP 163/76  Pulse 92  Resp 16  Ht 5' 4" (1.626 m)  Wt 149 lb 8 oz (67.813 kg)  BMI 25.66 kg/m2  Body mass index is 25.66 kg/(m^2).       Physical exam a well-developed alert white female no acute distress. Respirations nonlabored Abdomen with a well-healed midline incision with no evidence of erythema and no evidence of hernia Femoral incisions healing nicely bilaterally with 2+ femoral pulses bilaterally Left foot well-perfused with 2+ posterior tibial pulse Right foot with the obvious progressive dry gangrene of her toes: Onto her foot. Her popliteal incision is healing completely and she does not have any calf tenderness.  Impression and plan a nonviable right foot with unreconstructable tibial disease. I have recommended amputation. I had an extremely long discussion with the patient and her husband present. I explained that from an anatomic standpoint she certainly is borderline for healing of below-knee amputation.  From physical exam standpoint she is completely healed her popliteal incision at this level and has no discomfort or evidence of necrotic tissue at this level. I explained the option of below-knee amputation with the estimated at 50% chance of healing versus nonhealing. Also discussed the option of an above-knee amputation with a very high degree of certainty of healing. They do understand the significant rehabilitation difference between above-knee versus below-knee and are quite active and enjoy travel. We have decided to proceed with attempted below-knee amputation. The patient understands that if at the time of surgery we discovered that the dictation at the site is nonviable that we would   proceed with above-knee amputation at that time. I did offer surgery for tomorrow the patient wishes to defer until the first of the week which we scheduled for 1021 at [facility] 

## 2012-04-18 ENCOUNTER — Encounter (HOSPITAL_COMMUNITY): Payer: Self-pay | Admitting: Pharmacy Technician

## 2012-04-18 ENCOUNTER — Other Ambulatory Visit: Payer: Self-pay | Admitting: Vascular Surgery

## 2012-04-18 DIAGNOSIS — L97909 Non-pressure chronic ulcer of unspecified part of unspecified lower leg with unspecified severity: Secondary | ICD-10-CM

## 2012-04-20 ENCOUNTER — Encounter (HOSPITAL_COMMUNITY): Payer: Self-pay | Admitting: *Deleted

## 2012-04-20 ENCOUNTER — Telehealth: Payer: Self-pay | Admitting: *Deleted

## 2012-04-20 ENCOUNTER — Other Ambulatory Visit: Payer: Self-pay

## 2012-04-20 NOTE — Telephone Encounter (Signed)
Patient was cleared for surgery in last office visit by Dr. Excell Seltzer in August.  He subsequently did a cardiac catheterization with no intervention needed (medical management).  I have been asked today to clear the patient for surgery by phone.  I have not seen this patient in the past. The patient is supposed to have surgery on Monday and we have been contacted on Friday afternoon.  Dr. Excell Seltzer is not available today.  I would refer the surgeon/anesthesiologist to Dr. Earmon Phoenix note from 8/13.  To my review of the patient's record, it appears that no further cardiac workup is needed unless symptoms have significantly change in the last few days.

## 2012-04-20 NOTE — Progress Notes (Signed)
I spoke with Okey Regal at Dr Arbie Cookey; pt is to arrive at [facility] on Mon to go to IR for PICC insertion.

## 2012-04-20 NOTE — Telephone Encounter (Signed)
Received a call from [name] at [facility] [phone].  Pt is scheduled for Bk/AK surgery by  Dr Arbie Cookey Monday October 21,2013. This is Dr Earmon Phoenix patient and he is not available to for consultation. Okey Regal is requesting DOD review records and clear pt for surgery on Monday.   I will forward to Dr Shirlee Latch for review and recommendations.

## 2012-04-20 NOTE — Consult Note (Signed)
Anesthesia chart review: Patient is a 66 year old female scheduled for right below the indications versus above knee amputation by Dr. Arbie Cookey on 04/23/2012. She is scheduled to be a same-day workup.  History includes former smoker, CAD/NSTEMI s/p DES LCx 02/2009, HTN, HLD, GERD, PAD, anemia, Raynaud's disease, DM2, post-operative N/V. She is s/p staged bilateral FPBG (last on 08/22/11) and multiple percutaneous intervention in her iliac arteries.  She is s/p AFBG with thrombectomy of right FPBG on 03/26/12.  This was complicated by post-operative acute respiratory failure with flash pulmonary edema requiring intubation on postoperative day 2. Her troponin was also elevated. This lead to cardiology consultation was subsequent echocardiogram and cardiac catheterization. Ultimately continued medical therapy was recommended.  She was hospitalized from 03/26/12-04/09/12.  Her Cardiologist is Dr. Excell Seltzer.  He last saw her on 04/09/12 prior to her discharge from the hospital following her AFBG.  In her 04/06/12 progress note he recommended using her left thigh for BP checks, as it felt it was the most accurate.  EKG on 04/06/12 showed NSR, possible LAE, prolonged QT, diffuse T wave abnormality with new marked T wave inversion in V3-5.    Cardiac cath on 04/05/12 showed: Final Conclusions:  1. Continued patency of the left circumflex stent.  2. Mild nonobstructive stenosis of the proximal LAD and proximal left circumflex. (There is heavy calcification of the proximal LAD with mild nonobstructive diffuse 30-40% plaque.  At the distal apical portion of the LAD there is an 80% stenosis. The diagonal branches are widely patent. RCA a small and nondominant. The vessel supplies 2 RV marginal branches both of high-grade lesions in the 90% range.)  3. Normal LV function with elevated LVEDP. EF 65%. 4. Severe left subclavian stenosis with a 50 mm translesional gradient.  Proximal left subclavian artery with 75% stenosis  associated with calcification. Recommendations: Medical therapy for CAD.   Echo on 03/29/12 showed: - Left ventricle: Poor endocardial definiion but septum and apex appear hypokinetic The cavity size was mildly dilated. Wall thickness was normal. Systolic function was mildly to moderately reduced. The estimated ejection fraction was in the range of 40% to 45%. - Mitral valve: Calcified annulus. Mildly thickened leaflets. - Trivial mitral and pulmonic valve regurgitation. Mild tricuspid regurgitation.   Her last CXR on 04/08/12 showed: 1. Near complete resolution of pulmonary edema.  2. Left greater than right pleural effusions with associated  bibasilar atelectasis.  3. Question left shoulder calcific tendonitis/bursitis.   Will get labs, EKG, and CXR on arrival for a more recent baseline.    I reviewed above with Anesthesiologist Dr. Gelene Mink.  Due to patients multiple comorbidities and post-operative complications of pulmonary edema and NSTEMI type 2 following her surgery in September, we will need further cardiology input.  I called to speak with Dr. Excell Seltzer, but he is on vacation today.  I contacted [name], RN at [facility].  Dr. Arbie Cookey is also out of the office today.  She will contact [facility] to see if one of Dr. Earmon Phoenix partners will provide any pre-operative input.    Kaylee Chock, PA-C 04/20/12 1300  Update: 04/20/12 1700 Dr. Shirlee Latch provided the following input: "Patient was cleared for surgery in last office visit by Dr. Excell Seltzer in August. He subsequently did a cardiac catheterization with no intervention needed (medical management). I have been asked today to clear the patient for surgery by phone. I have not seen this patient in the past. The patient is supposed to have surgery on Monday and we  have been contacted on Friday afternoon. Dr. Excell Seltzer is not available today. I would refer the surgeon/anesthesiologist to Dr. Earmon Phoenix note from 8/13. To my review of the  patient's record, it appears that no further cardiac workup is needed unless symptoms have significantly change in the last few days."   She will be evaluated by her anesthesiologist on the day of surgery.  They can also have further discussion with Dr. Arbie Cookey.  If further cardiology input felt warranted, [facility] will have to be contacted at that time.

## 2012-04-20 NOTE — Progress Notes (Signed)
I spoke with patient today, pt states that the only pain that she is having is in her foot and that she is taking Demerol for that and Compazine for nausea with the Demerol.  Pt denies chest pain or chest pain.

## 2012-04-20 NOTE — Progress Notes (Signed)
I spoke with Dr Michelle Piper regarding patients recent hx of Resp Failure and MI post surgery in Sept.  Pt had Echo on 03/29/12 and cardiac cath on 04/05/12.  Dr Michelle Piper instructed me to had [name] see the chart.

## 2012-04-22 MED ORDER — DEXTROSE 5 % IV SOLN
1.5000 g | INTRAVENOUS | Status: AC
  Administered 2012-04-23: 1.5 g via INTRAVENOUS
  Filled 2012-04-22: qty 1.5

## 2012-04-22 NOTE — Telephone Encounter (Signed)
Pt well-known to me. I have managed her recently during a prolonged hospitalization. She can proceed with surgery at low cardiac risk. I'll be happy to follow her if needed.  Tonny Bollman 04/22/2012 11:33 PM

## 2012-04-23 ENCOUNTER — Ambulatory Visit (HOSPITAL_COMMUNITY)
Admission: RE | Admit: 2012-04-23 | Discharge: 2012-04-23 | Disposition: A | Discharge status: Home / Self Care | Payer: Medicare Other | Source: Ambulatory Visit | Attending: Vascular Surgery | Admitting: Vascular Surgery

## 2012-04-23 ENCOUNTER — Inpatient Hospital Stay (HOSPITAL_COMMUNITY)
Admission: RE | Admit: 2012-04-23 | Discharge: 2012-04-26 | DRG: 240 | Disposition: A | Discharge status: Rehabilitation Facility | Payer: Medicare Other | Source: Ambulatory Visit | Attending: Vascular Surgery | Admitting: Vascular Surgery

## 2012-04-23 ENCOUNTER — Inpatient Hospital Stay (HOSPITAL_COMMUNITY): Payer: Medicare Other | Admitting: Vascular Surgery

## 2012-04-23 ENCOUNTER — Other Ambulatory Visit: Payer: Self-pay | Admitting: Vascular Surgery

## 2012-04-23 ENCOUNTER — Encounter (HOSPITAL_COMMUNITY): Payer: Self-pay | Admitting: Vascular Surgery

## 2012-04-23 ENCOUNTER — Other Ambulatory Visit: Payer: Medicare Other

## 2012-04-23 ENCOUNTER — Encounter (HOSPITAL_COMMUNITY)
Admission: RE | Disposition: A | Discharge status: Rehabilitation Facility | Payer: Self-pay | Source: Ambulatory Visit | Attending: Vascular Surgery

## 2012-04-23 ENCOUNTER — Encounter (HOSPITAL_COMMUNITY): Payer: Self-pay | Admitting: *Deleted

## 2012-04-23 DIAGNOSIS — E1149 Type 2 diabetes mellitus with other diabetic neurological complication: Secondary | ICD-10-CM | POA: Diagnosis present

## 2012-04-23 DIAGNOSIS — E1142 Type 2 diabetes mellitus with diabetic polyneuropathy: Secondary | ICD-10-CM | POA: Diagnosis present

## 2012-04-23 DIAGNOSIS — I509 Heart failure, unspecified: Secondary | ICD-10-CM | POA: Diagnosis present

## 2012-04-23 DIAGNOSIS — I70209 Unspecified atherosclerosis of native arteries of extremities, unspecified extremity: Secondary | ICD-10-CM | POA: Insufficient documentation

## 2012-04-23 DIAGNOSIS — E782 Mixed hyperlipidemia: Secondary | ICD-10-CM | POA: Diagnosis present

## 2012-04-23 DIAGNOSIS — Z7902 Long term (current) use of antithrombotics/antiplatelets: Secondary | ICD-10-CM

## 2012-04-23 DIAGNOSIS — E440 Moderate protein-calorie malnutrition: Secondary | ICD-10-CM | POA: Diagnosis present

## 2012-04-23 DIAGNOSIS — I10 Essential (primary) hypertension: Secondary | ICD-10-CM | POA: Diagnosis present

## 2012-04-23 DIAGNOSIS — K219 Gastro-esophageal reflux disease without esophagitis: Secondary | ICD-10-CM | POA: Diagnosis present

## 2012-04-23 DIAGNOSIS — Z9861 Coronary angioplasty status: Secondary | ICD-10-CM

## 2012-04-23 DIAGNOSIS — I2510 Atherosclerotic heart disease of native coronary artery without angina pectoris: Secondary | ICD-10-CM | POA: Diagnosis present

## 2012-04-23 DIAGNOSIS — Z7982 Long term (current) use of aspirin: Secondary | ICD-10-CM

## 2012-04-23 DIAGNOSIS — Z87891 Personal history of nicotine dependence: Secondary | ICD-10-CM

## 2012-04-23 DIAGNOSIS — L97909 Non-pressure chronic ulcer of unspecified part of unspecified lower leg with unspecified severity: Secondary | ICD-10-CM

## 2012-04-23 DIAGNOSIS — I252 Old myocardial infarction: Secondary | ICD-10-CM

## 2012-04-23 DIAGNOSIS — I739 Peripheral vascular disease, unspecified: Principal | ICD-10-CM | POA: Diagnosis present

## 2012-04-23 DIAGNOSIS — Z79899 Other long term (current) drug therapy: Secondary | ICD-10-CM

## 2012-04-23 DIAGNOSIS — I5022 Chronic systolic (congestive) heart failure: Secondary | ICD-10-CM | POA: Diagnosis present

## 2012-04-23 DIAGNOSIS — I7300 Raynaud's syndrome without gangrene: Secondary | ICD-10-CM | POA: Diagnosis present

## 2012-04-23 DIAGNOSIS — F411 Generalized anxiety disorder: Secondary | ICD-10-CM | POA: Diagnosis present

## 2012-04-23 DIAGNOSIS — I70269 Atherosclerosis of native arteries of extremities with gangrene, unspecified extremity: Secondary | ICD-10-CM

## 2012-04-23 DIAGNOSIS — D649 Anemia, unspecified: Secondary | ICD-10-CM | POA: Diagnosis present

## 2012-04-23 HISTORY — DX: Constipation, unspecified: K59.00

## 2012-04-23 HISTORY — PX: AMPUTATION: SHX166

## 2012-04-23 HISTORY — DX: Heart failure, unspecified: I50.9

## 2012-04-23 HISTORY — DX: Umbilical hernia without obstruction or gangrene: K42.9

## 2012-04-23 HISTORY — DX: Personal history of other medical treatment: Z92.89

## 2012-04-23 HISTORY — DX: Acute myocardial infarction, unspecified: I21.9

## 2012-04-23 HISTORY — DX: Calculus of kidney: N20.0

## 2012-04-23 HISTORY — DX: Urinary tract infection, site not specified: N39.0

## 2012-04-23 LAB — GLUCOSE, CAPILLARY
Glucose-Capillary: 144 mg/dL — ABNORMAL HIGH (ref 70–99)
Glucose-Capillary: 94 mg/dL (ref 70–99)
Glucose-Capillary: 94 mg/dL (ref 70–99)
Glucose-Capillary: 131 mg/dL — ABNORMAL HIGH (ref 70–99)

## 2012-04-23 LAB — BASIC METABOLIC PANEL
Calcium: 8.7 mg/dL (ref 8.4–10.5)
Chloride: 96 mEq/L (ref 96–112)
Creatinine, Ser: 0.55 mg/dL (ref 0.50–1.10)
GFR calc Af Amer: >90 mL/min (ref >90)
GFR calc non Af Amer: >90 mL/min (ref >90)

## 2012-04-23 LAB — CREATININE, SERUM
GFR calc Af Amer: >90 mL/min (ref >90)
GFR calc non Af Amer: >90 mL/min (ref >90)

## 2012-04-23 LAB — CBC
HCT: 25.6 % — ABNORMAL LOW (ref 36.0–46.0)
HCT: 24.9 % — ABNORMAL LOW (ref 36.0–46.0)
Hemoglobin: 8 g/dL — ABNORMAL LOW (ref 12.0–15.0)
MCV: 83 fL (ref 78.0–100.0)
WBC: 12.3 10*3/uL — ABNORMAL HIGH (ref 4.0–10.5)
WBC: 8.3 10*3/uL (ref 4.0–10.5)

## 2012-04-23 SURGERY — AMPUTATION BELOW KNEE
Anesthesia: General | Site: Leg Lower | Laterality: Right | Wound class: Clean

## 2012-04-23 MED ORDER — PROMETHAZINE HCL 25 MG/ML IJ SOLN
12.5000 mg | Freq: Four times a day (QID) | INTRAMUSCULAR | Status: DC | PRN
  Administered 2012-04-25 (×2): 12.5 mg via INTRAVENOUS
  Filled 2012-04-23 (×2): qty 1

## 2012-04-23 MED ORDER — DEXLANSOPRAZOLE 60 MG PO CPDR
60.0000 mg | DELAYED_RELEASE_CAPSULE | Freq: Two times a day (BID) | ORAL | Status: DC
  Administered 2012-04-23 – 2012-04-26 (×6): 60 mg via ORAL
  Filled 2012-04-23: qty 1

## 2012-04-23 MED ORDER — MAGNESIUM SULFATE 40 MG/ML IJ SOLN
2.0000 g | Freq: Once | INTRAMUSCULAR | Status: AC | PRN
  Filled 2012-04-23: qty 50

## 2012-04-23 MED ORDER — PHENYLEPHRINE HCL 10 MG/ML IJ SOLN
INTRAMUSCULAR | Status: DC | PRN
  Administered 2012-04-23 (×2): 80 ug via INTRAVENOUS
  Administered 2012-04-23: 40 ug via INTRAVENOUS
  Administered 2012-04-23: 120 ug via INTRAVENOUS
  Administered 2012-04-23: 40 ug via INTRAVENOUS
  Administered 2012-04-23: 120 ug via INTRAVENOUS
  Administered 2012-04-23: 40 ug via INTRAVENOUS

## 2012-04-23 MED ORDER — INSULIN ASPART 100 UNIT/ML ~~LOC~~ SOLN
0.0000 [IU] | Freq: Three times a day (TID) | SUBCUTANEOUS | Status: DC
  Administered 2012-04-24: 2 [IU] via SUBCUTANEOUS
  Administered 2012-04-24: 3 [IU] via SUBCUTANEOUS
  Administered 2012-04-25 – 2012-04-26 (×3): 2 [IU] via SUBCUTANEOUS

## 2012-04-23 MED ORDER — TRAMADOL HCL 50 MG PO TABS
50.0000 mg | ORAL_TABLET | Freq: Four times a day (QID) | ORAL | Status: DC | PRN
  Administered 2012-04-23 – 2012-04-24 (×3): 50 mg via ORAL
  Filled 2012-04-23 (×3): qty 1

## 2012-04-23 MED ORDER — METOPROLOL TARTRATE 1 MG/ML IV SOLN: 2.0000 mg | INTRAVENOUS | Status: DC | PRN

## 2012-04-23 MED ORDER — ISOSORBIDE MONONITRATE ER 60 MG PO TB24
60.0000 mg | ORAL_TABLET | Freq: Every day | ORAL | Status: DC
  Administered 2012-04-24 – 2012-04-26 (×3): 60 mg via ORAL
  Filled 2012-04-23 (×3): qty 1

## 2012-04-23 MED ORDER — PROCHLORPERAZINE MALEATE 10 MG PO TABS
10.0000 mg | ORAL_TABLET | Freq: Four times a day (QID) | ORAL | Status: DC | PRN
  Administered 2012-04-25 – 2012-04-26 (×3): 10 mg via ORAL
  Filled 2012-04-23 (×5): qty 1

## 2012-04-23 MED ORDER — SCOPOLAMINE 1 MG/3DAYS TD PT72
MEDICATED_PATCH | TRANSDERMAL | Status: DC | PRN
  Administered 2012-04-23: 1 via TRANSDERMAL

## 2012-04-23 MED ORDER — DEXTROSE 5 % IV SOLN
1.5000 g | Freq: Two times a day (BID) | INTRAVENOUS | Status: AC
  Administered 2012-04-24 (×2): 1.5 g via INTRAVENOUS
  Filled 2012-04-23 (×3): qty 1.5

## 2012-04-23 MED ORDER — ENOXAPARIN SODIUM 40 MG/0.4ML ~~LOC~~ SOLN
40.0000 mg | SUBCUTANEOUS | Status: DC
  Administered 2012-04-24 – 2012-04-25 (×2): 40 mg via SUBCUTANEOUS
  Filled 2012-04-23 (×4): qty 0.4

## 2012-04-23 MED ORDER — HYDROMORPHONE HCL PF 1 MG/ML IJ SOLN: 0.2500 mg | INTRAMUSCULAR | Status: DC | PRN

## 2012-04-23 MED ORDER — ONDANSETRON HCL 4 MG/2ML IJ SOLN
INTRAMUSCULAR | Status: DC | PRN
  Administered 2012-04-23: 4 mg via INTRAVENOUS

## 2012-04-23 MED ORDER — 0.9 % SODIUM CHLORIDE (POUR BTL) OPTIME
TOPICAL | Status: DC | PRN
  Administered 2012-04-23: 1000 mL

## 2012-04-23 MED ORDER — LIDOCAINE HCL (CARDIAC) 20 MG/ML IV SOLN
INTRAVENOUS | Status: DC | PRN
  Administered 2012-04-23: 60 mg via INTRAVENOUS

## 2012-04-23 MED ORDER — POLYETHYLENE GLYCOL 3350 17 G PO PACK
17.0000 g | PACK | Freq: Every day | ORAL | Status: DC | PRN
  Filled 2012-04-23: qty 1

## 2012-04-23 MED ORDER — CLOPIDOGREL BISULFATE 75 MG PO TABS
75.0000 mg | ORAL_TABLET | Freq: Every day | ORAL | Status: DC
  Administered 2012-04-24 – 2012-04-26 (×3): 75 mg via ORAL
  Filled 2012-04-23 (×3): qty 1

## 2012-04-23 MED ORDER — HYDROMORPHONE HCL PF 1 MG/ML IJ SOLN
0.5000 mg | INTRAMUSCULAR | Status: DC | PRN
  Administered 2012-04-23 – 2012-04-24 (×12): 1 mg via INTRAVENOUS
  Administered 2012-04-25: 0.5 mg via INTRAVENOUS
  Filled 2012-04-23 (×14): qty 1

## 2012-04-23 MED ORDER — SODIUM CHLORIDE 0.9 % IV SOLN
INTRAVENOUS | Status: DC
  Administered 2012-04-23 – 2012-04-24 (×2): via INTRAVENOUS
  Administered 2012-04-25: 75 mL/h via INTRAVENOUS

## 2012-04-23 MED ORDER — ASPIRIN EC 325 MG PO TBEC
325.0000 mg | DELAYED_RELEASE_TABLET | Freq: Every morning | ORAL | Status: DC
  Administered 2012-04-24 – 2012-04-26 (×3): 325 mg via ORAL
  Filled 2012-04-23 (×3): qty 1

## 2012-04-23 MED ORDER — GLYBURIDE 2.5 MG PO TABS
2.5000 mg | ORAL_TABLET | Freq: Every morning | ORAL | Status: DC
  Administered 2012-04-24 – 2012-04-26 (×2): 2.5 mg via ORAL
  Filled 2012-04-23 (×3): qty 1

## 2012-04-23 MED ORDER — POLYSACCHARIDE IRON COMPLEX 150 MG PO CAPS
150.0000 mg | ORAL_CAPSULE | Freq: Two times a day (BID) | ORAL | Status: DC
Start: 2012-04-24 — End: 2012-04-26
  Administered 2012-04-24 – 2012-04-26 (×4): 150 mg via ORAL
  Filled 2012-04-23 (×6): qty 1

## 2012-04-23 MED ORDER — PIOGLITAZONE HCL 15 MG PO TABS
15.0000 mg | ORAL_TABLET | Freq: Every day | ORAL | Status: DC
  Filled 2012-04-23: qty 1

## 2012-04-23 MED ORDER — PIOGLITAZONE HCL-METFORMIN HCL 15-500 MG PO TABS: 1.0000 | ORAL_TABLET | Freq: Two times a day (BID) | ORAL | Status: DC

## 2012-04-23 MED ORDER — SODIUM CHLORIDE 0.9 % IJ SOLN: 10.0000 mL | Freq: Two times a day (BID) | INTRAMUSCULAR | Status: DC

## 2012-04-23 MED ORDER — FENTANYL CITRATE 0.05 MG/ML IJ SOLN
INTRAMUSCULAR | Status: DC | PRN
  Administered 2012-04-23 (×3): 50 ug via INTRAVENOUS

## 2012-04-23 MED ORDER — HYDROMORPHONE HCL PF 1 MG/ML IJ SOLN
INTRAMUSCULAR | Status: AC
  Administered 2012-04-23: 16:00:00
  Filled 2012-04-23: qty 1

## 2012-04-23 MED ORDER — CEFUROXIME SODIUM 1.5 G IJ SOLR
INTRAMUSCULAR | Status: AC
  Filled 2012-04-23: qty 1.5

## 2012-04-23 MED ORDER — PIOGLITAZONE HCL-METFORMIN HCL 15-500 MG PO TABS: 1.0000 | ORAL_TABLET | Freq: Every day | ORAL | Status: DC

## 2012-04-23 MED ORDER — LACTATED RINGERS IV SOLN
INTRAVENOUS | Status: DC
  Administered 2012-04-23: 12:00:00 via INTRAVENOUS

## 2012-04-23 MED ORDER — CHOLECALCIFEROL 625 MCG (25000 UT) PO CAPS: 25000.0000 [IU] | ORAL_CAPSULE | ORAL | Status: DC

## 2012-04-23 MED ORDER — HYDRALAZINE HCL 20 MG/ML IJ SOLN
10.0000 mg | INTRAMUSCULAR | Status: DC | PRN
  Administered 2012-04-24 – 2012-04-25 (×3): 10 mg via INTRAVENOUS
  Filled 2012-04-23 (×2): qty 0.5

## 2012-04-23 MED ORDER — ALBUMIN HUMAN 5 % IV SOLN
INTRAVENOUS | Status: DC | PRN
  Administered 2012-04-23: 14:00:00 via INTRAVENOUS

## 2012-04-23 MED ORDER — POTASSIUM CHLORIDE CRYS ER 20 MEQ PO TBCR: 20.0000 meq | EXTENDED_RELEASE_TABLET | Freq: Once | ORAL | Status: AC | PRN

## 2012-04-23 MED ORDER — POTASSIUM CHLORIDE ER 10 MEQ PO TBCR
10.0000 meq | EXTENDED_RELEASE_TABLET | Freq: Two times a day (BID) | ORAL | Status: DC
  Administered 2012-04-24 – 2012-04-26 (×5): 10 meq via ORAL
  Filled 2012-04-23 (×6): qty 1

## 2012-04-23 MED ORDER — ALPRAZOLAM 0.25 MG PO TABS
0.2500 mg | ORAL_TABLET | Freq: Every morning | ORAL | Status: DC
  Administered 2012-04-24: 0.25 mg via ORAL
  Filled 2012-04-23: qty 1

## 2012-04-23 MED ORDER — ACETAMINOPHEN 10 MG/ML IV SOLN: 1000.0000 mg | Freq: Once | INTRAVENOUS | Status: DC | PRN

## 2012-04-23 MED ORDER — PHENOL 1.4 % MT LIQD
1.0000 | OROMUCOSAL | Status: DC | PRN
  Filled 2012-04-23: qty 177

## 2012-04-23 MED ORDER — VITAMIN D (ERGOCALCIFEROL) 1.25 MG (50000 UNIT) PO CAPS
50000.0000 [IU] | ORAL_CAPSULE | ORAL | Status: DC
Start: 2012-04-26 — End: 2012-04-26
  Administered 2012-04-26: 50000 [IU] via ORAL
  Filled 2012-04-23: qty 1

## 2012-04-23 MED ORDER — HEPARIN SOD (PORK) LOCK FLUSH 100 UNIT/ML IV SOLN
INTRAVENOUS | Status: AC
  Filled 2012-04-23: qty 5

## 2012-04-23 MED ORDER — METFORMIN HCL 500 MG PO TABS
500.0000 mg | ORAL_TABLET | Freq: Every day | ORAL | Status: DC
  Filled 2012-04-23: qty 1

## 2012-04-23 MED ORDER — MIDAZOLAM HCL 5 MG/5ML IJ SOLN
INTRAMUSCULAR | Status: DC | PRN
  Administered 2012-04-23: 2 mg via INTRAVENOUS

## 2012-04-23 MED ORDER — HEPARIN SOD (PORK) LOCK FLUSH 100 UNIT/ML IV SOLN
500.0000 [IU] | Freq: Once | INTRAVENOUS | Status: DC
  Filled 2012-04-23: qty 5

## 2012-04-23 MED ORDER — SCOPOLAMINE 1 MG/3DAYS TD PT72
MEDICATED_PATCH | TRANSDERMAL | Status: AC
  Filled 2012-04-23: qty 1

## 2012-04-23 MED ORDER — DOCUSATE SODIUM 100 MG PO CAPS
100.0000 mg | ORAL_CAPSULE | Freq: Every day | ORAL | Status: DC
  Administered 2012-04-24: 100 mg via ORAL
  Filled 2012-04-23 (×4): qty 1

## 2012-04-23 MED ORDER — FUROSEMIDE 40 MG PO TABS
40.0000 mg | ORAL_TABLET | Freq: Every day | ORAL | Status: DC
  Administered 2012-04-24 – 2012-04-26 (×3): 40 mg via ORAL
  Filled 2012-04-23 (×3): qty 1

## 2012-04-23 MED ORDER — PROPOFOL 10 MG/ML IV BOLUS
INTRAVENOUS | Status: DC | PRN
  Administered 2012-04-23: 30 mg via INTRAVENOUS
  Administered 2012-04-23: 170 mg via INTRAVENOUS

## 2012-04-23 MED ORDER — SODIUM CHLORIDE 0.9 % IJ SOLN
10.0000 mL | INTRAMUSCULAR | Status: DC | PRN
  Administered 2012-04-23 – 2012-04-26 (×4): 10 mL

## 2012-04-23 MED ORDER — MUPIROCIN 2 % EX OINT
TOPICAL_OINTMENT | Freq: Two times a day (BID) | CUTANEOUS | Status: DC
  Administered 2012-04-23: 1 via NASAL
  Administered 2012-04-24 (×2): via NASAL
  Filled 2012-04-23: qty 22

## 2012-04-23 MED ORDER — RAMIPRIL 2.5 MG PO CAPS
2.5000 mg | ORAL_CAPSULE | Freq: Every day | ORAL | Status: DC
  Administered 2012-04-24: 2.5 mg via ORAL
  Filled 2012-04-23 (×2): qty 1

## 2012-04-23 MED ORDER — CALCIUM CARBONATE ANTACID 500 MG PO CHEW
1.0000 | CHEWABLE_TABLET | Freq: Every day | ORAL | Status: DC | PRN
Start: 2012-04-23 — End: 2012-04-26
  Filled 2012-04-23: qty 4

## 2012-04-23 MED ORDER — ONDANSETRON HCL 4 MG/2ML IJ SOLN: 4.0000 mg | Freq: Once | INTRAMUSCULAR | Status: DC | PRN

## 2012-04-23 MED ORDER — LABETALOL HCL 5 MG/ML IV SOLN
10.0000 mg | INTRAVENOUS | Status: DC | PRN
  Administered 2012-04-25: 10 mg via INTRAVENOUS
  Administered 2012-04-26: 20 mg via INTRAVENOUS
  Filled 2012-04-23 (×2): qty 4

## 2012-04-23 MED ORDER — NITROGLYCERIN 0.4 MG SL SUBL: 0.4000 mg | SUBLINGUAL_TABLET | SUBLINGUAL | Status: DC | PRN

## 2012-04-23 MED ORDER — MUPIROCIN 2 % EX OINT
TOPICAL_OINTMENT | CUTANEOUS | Status: AC
  Administered 2012-04-23: 11:00:00
  Filled 2012-04-23: qty 22

## 2012-04-23 MED ORDER — DEXTROSE 5 % IV SOLN
INTRAVENOUS | Status: AC
  Filled 2012-04-23: qty 50

## 2012-04-23 MED ORDER — BISACODYL 10 MG RE SUPP
10.0000 mg | Freq: Every day | RECTAL | Status: DC | PRN
  Filled 2012-04-23: qty 1

## 2012-04-23 MED ORDER — EPHEDRINE SULFATE 50 MG/ML IJ SOLN
INTRAMUSCULAR | Status: DC | PRN
  Administered 2012-04-23: 10 mg via INTRAVENOUS
  Administered 2012-04-23: 15 mg via INTRAVENOUS

## 2012-04-23 MED ORDER — METOPROLOL TARTRATE 50 MG PO TABS
50.0000 mg | ORAL_TABLET | Freq: Two times a day (BID) | ORAL | Status: DC
  Administered 2012-04-23 – 2012-04-26 (×6): 50 mg via ORAL
  Filled 2012-04-23 (×7): qty 1

## 2012-04-23 MED ORDER — LACTATED RINGERS IV SOLN
INTRAVENOUS | Status: DC | PRN
  Administered 2012-04-23: 13:00:00 via INTRAVENOUS

## 2012-04-23 MED ORDER — LINAGLIPTIN 5 MG PO TABS
5.0000 mg | ORAL_TABLET | Freq: Every day | ORAL | Status: DC
  Administered 2012-04-23 – 2012-04-24 (×2): 5 mg via ORAL
  Filled 2012-04-23 (×4): qty 1

## 2012-04-23 SURGICAL SUPPLY — 50 items
BANDAGE ELASTIC 4 VELCRO ST LF (GAUZE/BANDAGES/DRESSINGS) ×2 IMPLANT
BANDAGE ELASTIC 6 VELCRO ST LF (GAUZE/BANDAGES/DRESSINGS) IMPLANT
BANDAGE ESMARK 6X9 LF (GAUZE/BANDAGES/DRESSINGS) IMPLANT
BANDAGE GAUZE ELAST BULKY 4 IN (GAUZE/BANDAGES/DRESSINGS) ×1 IMPLANT
BNDG CMPR 9X6 STRL LF SNTH (GAUZE/BANDAGES/DRESSINGS)
BNDG ESMARK 6X9 LF (GAUZE/BANDAGES/DRESSINGS)
CANISTER SUCTION 2500CC (MISCELLANEOUS) ×2 IMPLANT
CLIP LIGATING EXTRA MED SLVR (CLIP) ×2 IMPLANT
CLIP LIGATING EXTRA SM BLUE (MISCELLANEOUS) ×2 IMPLANT
CLOTH BEACON ORANGE TIMEOUT ST (SAFETY) ×2 IMPLANT
COVER SURGICAL LIGHT HANDLE (MISCELLANEOUS) ×2 IMPLANT
CUFF TOURNIQUET SINGLE 34IN LL (TOURNIQUET CUFF) IMPLANT
CUFF TOURNIQUET SINGLE 44IN (TOURNIQUET CUFF) IMPLANT
DRAIN SNY 10X20 3/4 PERF (WOUND CARE) IMPLANT
DRAPE ORTHO SPLIT 77X108 STRL (DRAPES) ×4
DRAPE PROXIMA HALF (DRAPES) ×2 IMPLANT
DRAPE SURG ORHT 6 SPLT 77X108 (DRAPES) ×2 IMPLANT
ELECT REM PT RETURN 9FT ADLT (ELECTROSURGICAL) ×2
ELECTRODE REM PT RTRN 9FT ADLT (ELECTROSURGICAL) ×1 IMPLANT
EVACUATOR SILICONE 100CC (DRAIN) IMPLANT
GAUZE XEROFORM 5X9 LF (GAUZE/BANDAGES/DRESSINGS) ×2 IMPLANT
GLOVE BIO SURGEON STRL SZ 6.5 (GLOVE) ×1 IMPLANT
GLOVE BIOGEL PI IND STRL 6.5 (GLOVE) IMPLANT
GLOVE BIOGEL PI IND STRL 7.0 (GLOVE) IMPLANT
GLOVE BIOGEL PI INDICATOR 6.5 (GLOVE) ×2
GLOVE BIOGEL PI INDICATOR 7.0 (GLOVE) ×2
GLOVE SS BIOGEL STRL SZ 7.5 (GLOVE) ×1 IMPLANT
GLOVE SUPERSENSE BIOGEL SZ 7.5 (GLOVE) ×1
GLOVE SURG SS PI 6.5 STRL IVOR (GLOVE) ×1 IMPLANT
GOWN PREVENTION PLUS XLARGE (GOWN DISPOSABLE) ×2 IMPLANT
GOWN STRL NON-REIN LRG LVL3 (GOWN DISPOSABLE) ×6 IMPLANT
KIT BASIN OR (CUSTOM PROCEDURE TRAY) ×2 IMPLANT
KIT ROOM TURNOVER OR (KITS) ×2 IMPLANT
NS IRRIG 1000ML POUR BTL (IV SOLUTION) ×2 IMPLANT
PACK GENERAL/GYN (CUSTOM PROCEDURE TRAY) ×2 IMPLANT
PAD ARMBOARD 7.5X6 YLW CONV (MISCELLANEOUS) ×4 IMPLANT
PADDING CAST COTTON 6X4 STRL (CAST SUPPLIES) IMPLANT
SAW GIGLI STERILE 20 (MISCELLANEOUS) ×2 IMPLANT
SPONGE GAUZE 4X4 12PLY (GAUZE/BANDAGES/DRESSINGS) ×2 IMPLANT
STAPLER VISISTAT 35W (STAPLE) ×2 IMPLANT
STOCKINETTE IMPERVIOUS LG (DRAPES) ×2 IMPLANT
SUT ETHILON 3 0 PS 1 (SUTURE) IMPLANT
SUT VIC AB 0 CT1 18XCR BRD 8 (SUTURE) ×2 IMPLANT
SUT VIC AB 0 CT1 8-18 (SUTURE) ×4
SUT VICRYL 2 0 18  TIES (SUTURE) ×1
SUT VICRYL 2 0 18 TIES (SUTURE) ×1 IMPLANT
TOWEL OR 17X24 6PK STRL BLUE (TOWEL DISPOSABLE) ×2 IMPLANT
TOWEL OR 17X26 10 PK STRL BLUE (TOWEL DISPOSABLE) ×2 IMPLANT
UNDERPAD 30X30 INCONTINENT (UNDERPADS AND DIAPERS) ×2 IMPLANT
WATER STERILE IRR 1000ML POUR (IV SOLUTION) ×2 IMPLANT

## 2012-04-23 NOTE — Anesthesia Postprocedure Evaluation (Signed)
Anesthesia Post Note  Patient: [name]  Procedure(s) Performed: Procedure(s) (LRB): AMPUTATION BELOW KNEE (Right)  Anesthesia type: general  Patient location: PACU  Post pain: Pain level controlled  Post assessment: Patient's Cardiovascular Status Stable  Last Vitals:  Filed Vitals:   04/23/12 1500  BP:   Pulse: 85  Temp:   Resp: 15    Post vital signs: Reviewed and stable  Level of consciousness: sedated  Complications: No apparent anesthesia complications

## 2012-04-23 NOTE — Anesthesia Procedure Notes (Signed)
Procedure Name: LMA Insertion Date/Time: 04/23/2012 1:25 PM Performed by: Elon Alas Pre-anesthesia Checklist: Patient identified, Timeout performed, Emergency Drugs available, Suction available and Patient being monitored Patient Re-evaluated:Patient Re-evaluated prior to inductionOxygen Delivery Method: Circle system utilized Preoxygenation: Pre-oxygenation with 100% oxygen Intubation Type: IV induction Ventilation: Mask ventilation without difficulty LMA: LMA inserted LMA Size: 4.0 Number of attempts: 1 Placement Confirmation: positive ETCO2,  ETT inserted through vocal cords under direct vision and breath sounds checked- equal and bilateral Tube secured with: Tape Dental Injury: Teeth and Oropharynx as per pre-operative assessment

## 2012-04-23 NOTE — Transfer of Care (Signed)
Immediate Anesthesia Transfer of Care Note  Patient: [name]  Procedure(s) Performed: Procedure(s) (LRB) with comments: AMPUTATION BELOW KNEE (Right)  Patient Location: PACU  Anesthesia Type: General  Level of Consciousness: awake, alert  and oriented  Airway & Oxygen Therapy: Patient Spontanous Breathing and Patient connected to nasal cannula oxygen  Post-op Assessment: Report given to PACU RN, Post -op Vital signs reviewed and stable and Patient moving all extremities X 4  Post vital signs: Reviewed and stable  Complications: No apparent anesthesia complications

## 2012-04-23 NOTE — Anesthesia Preprocedure Evaluation (Addendum)
Anesthesia Evaluation  Patient identified by MRN, date of birth, ID band Patient awake    Reviewed: Allergy & Precautions, H&P , NPO status , Patient's Chart, lab work & pertinent test results, reviewed documented beta blocker date and time   History of Anesthesia Complications (+) PONV  Airway Mallampati: II TM Distance: >3 FB Neck ROM: Full    Dental  (+) Edentulous Upper and Dental Advisory Given   Pulmonary former smoker,  breath sounds clear to auscultation        Cardiovascular hypertension, + CAD, + Past MI, + Cardiac Stents and +CHF Rhythm:Regular Rate:Normal     Neuro/Psych PSYCHIATRIC DISORDERS Anxiety    GI/Hepatic GERD-  ,  Endo/Other  diabetes  Renal/GU      Musculoskeletal   Abdominal   Peds  Hematology   Anesthesia Other Findings   Reproductive/Obstetrics                          Anesthesia Physical Anesthesia Plan  ASA: III  Anesthesia Plan: General   Post-op Pain Management:    Induction: Intravenous  Airway Management Planned: LMA  Additional Equipment:   Intra-op Plan:   Post-operative Plan:   Informed Consent: I have reviewed the patients History and Physical, chart, labs and discussed the procedure including the risks, benefits and alternatives for the proposed anesthesia with the patient or authorized representative who has indicated his/her understanding and acceptance.   Dental advisory given  Plan Discussed with: CRNA and Surgeon  Anesthesia Plan Comments: (1.PVD, S/P Aorto-bifem, R.FPBG with ischemia R. Foot. 2. L.Subclavian stenosisCAD S/P stenting of LAD, nonstemi and pulm edema  following surgery 03/26/12, Cath 04/05/12 no significant large vessel stenoses. 3. Type 2 DM glucose 113 4. htn  Plan GA with LMA  Kipp Brood, MD )      Anesthesia Quick Evaluation

## 2012-04-23 NOTE — Preoperative (Signed)
Beta Blockers   Reason not to administer Beta Blockers:Not Applicable 

## 2012-04-23 NOTE — Op Note (Signed)
OPERATIVE REPORT  DATE OF SURGERY: 04/23/2012  PATIENT: [name], 66 y.o. female MRN: [id number]  DOB: [birth year]  PRE-OPERATIVE DIAGNOSIS: Right foot ischemia  POST-OPERATIVE DIAGNOSIS:  Same  PROCEDURE: Right below-knee amputation  SURGEON:  Gretta Began, M.D.  PHYSICIAN ASSISTANT: Roczniak  ANESTHESIA:  Gen.  EBL: 100 ml  Total I/O In: 1000 [I.V.:750; IV Piggyback:250] Out: 100 [Blood:100]  BLOOD ADMINISTERED: None  DRAINS: None  SPECIMEN: Right below-knee amputation  COUNTS CORRECT:  YES  PLAN OF CARE: PACU   PATIENT DISPOSITION:  PACU - hemodynamically stable  PROCEDURE DETAILS: The patient was taken to the operating room placed supine position where the area of the right leg was prepped and draped in sterile fashion. Using a posterior based muscle flap was made several fingerbreadths below the tibial prominence and carried down through the anterior tibial muscle bodies. Anterior tibial neurovascular bundle was ligated with Vicryl ties and divided. A gastrocnemius posterior flap was left in line with the posterior skin flap. The popliteal neurovascular bundle was ligated and divided. Periosteum was elevated off the tibia and fibula. The fibula was divided with bone shears and the tibia was divided with the Gigli saw. This bone edges were smoothed with a bone rasp on the tibia. The fibula was also smoothed with a Bryn Gulling. Was irrigated with saline and hemostasis electrocautery. All muscle was viable pink and retracted with electrocautery. The posterior flap was sewn to the anterior flap with interrupted 0 Vicryl figure-of-eight sutures. End was closed with skin clips. Sterile dressing and Ace wrap were applied and the patient was transferred to the recovery room in stable condition   Gretta Began, M.D. 04/23/2012 2:39 PM

## 2012-04-23 NOTE — H&P (View-Only) (Signed)
The patient has today for followup of a recent extensive hospitalization for attempted limb salvage. She underwent aortobifemoral bypass and attempted revascularization into her peroneal artery on the right leg. She had a postoperative complication with pulmonary edema and reintubation and elevated cardiac enzymes. She underwent a cardiac catheterization prior to discharge showing no evidence of triple coronary disease. She did have ongoing profound ischemia of her right foot with no outflow into her foot with discussion of need for amputation. The patient I wanted to be discharged with some time interval. I explained prior to discharge at the 2 factors that would necessitate a dictation would be progressive tissue loss or intolerable pain. She is having significant right foot rest pain and is treating this with oral Demerol.  Past Medical History  Diagnosis Date  . Hypertension     Unspecified  . Hyperlipidemia     Mixed  . Tobacco abuse     Remote  . Loose, teeth     has loose bridge and two loose teeth holding it  . PONV (postoperative nausea and vomiting)   . Type II diabetes mellitus   . GERD (gastroesophageal reflux disease)   . Anxiety   . Peripheral vascular disease     unspecified, a. s/p L CEA b. s/p B fem-pop bypass  . Raynaud disease   . Stones in the urinary tract   . UTI (urinary tract infection)   . Anemia     hx  . Coronary artery disease     a. s/p NSTEMI 02/2009 - PCI LCX with Xience DES. Otherwise branch vessel and Dist. RCA dzs. NL EF.     History  Substance Use Topics  . Smoking status: Former Smoker -- 2.0 packs/day for 39 years    Types: Cigarettes    Quit date: 07/05/2001  . Smokeless tobacco: Never Used  . Alcohol Use: 7.0 oz/week    14 drink(s) per week    Family History  Problem Relation Age of Onset  . Heart attack Mother   . Heart attack Father   . Hypertension Brother   . Diabetes Brother   . Hypertension Brother     Allergies  Allergen  Reactions  . Codeine Nausea And Vomiting  . Erythromycin Nausea And Vomiting  . Hydrocodone-Acetaminophen Nausea And Vomiting  . Hydromorphone Nausea And Vomiting  . Propoxyphene Hcl Nausea And Vomiting  . Statins Other (See Comments)    Leg cramps  . Acetaminophen Other (See Comments)    Does not tolerate well, nausea  . Eggs Or Egg-Derived Products     "pt does not eat"  . Ibuprofen Other (See Comments)    Does not tolerate well  . Shellfish-Derived Products     "pt does not eat"    Current outpatient prescriptions:ALPRAZolam (XANAX) 0.25 MG tablet, Take 0.25 mg by mouth daily. , Disp: , Rfl: ;  aspirin 325 MG EC tablet, Take 325 mg by mouth daily., Disp: , Rfl: ;  calcium carbonate (TUMS) 500 MG chewable tablet, Chew 1-4 tablets by mouth daily as needed. For acid reflux. Pt can take 1 to 4 tabs as needed for reflux, Disp: , Rfl:  Cholecalciferol 25000 UNITS CAPS, Take 25,000 Units by mouth 3 (three) times a week. Monday, Wednesday, friday, Disp: , Rfl: ;  dexlansoprazole (DEXILANT) 60 MG capsule, Take 60 mg by mouth 2 (two) times daily., Disp: , Rfl: ;  furosemide (LASIX) 40 MG tablet, Take 1 tablet (40 mg total) by mouth daily., Disp: 30 tablet,  Rfl: 1;  glyBURIDE (DIABETA) 2.5 MG tablet, Take 2.5 mg by mouth every morning. , Disp: , Rfl:  iron polysaccharides (NIFEREX) 150 MG capsule, Take 150 mg by mouth 2 (two) times daily., Disp: , Rfl: ;  isosorbide mononitrate (IMDUR) 60 MG 24 hr tablet, Take 1 tablet (60 mg total) by mouth daily., Disp: 30 tablet, Rfl: 1;  levofloxacin (LEVAQUIN) 500 MG tablet, Take 1 tablet (500 mg total) by mouth daily., Disp: 7 tablet, Rfl: 0;  meperidine (DEMEROL) 50 MG tablet, Take 25-50 mg by mouth every 4 (four) hours as needed. For pain, Disp: , Rfl:  metoprolol (LOPRESSOR) 50 MG tablet, Take 1 tablet (50 mg total) by mouth 2 (two) times daily., Disp: 60 tablet, Rfl: 1;  nitroGLYCERIN (NITROSTAT) 0.4 MG SL tablet, Place 0.4 mg under the tongue every 5 (five)  minutes as needed. For chest pain, Disp: , Rfl: ;  omega-3 acid ethyl esters (LOVAZA) 1 G capsule, Take 2 g by mouth 2 (two) times daily. , Disp: , Rfl:  pioglitazone-metformin (ACTOPLUS MET) 15-500 MG per tablet, Take 1 tablet by mouth daily. Brand name only-does not work if components are given separately per patient, Disp: , Rfl: ;  potassium chloride (K-DUR) 10 MEQ tablet, Take 1 tablet (10 mEq total) by mouth 2 (two) times daily., Disp: 60 tablet, Rfl: 1;  Probiotic Product (ACIDOPHILUS PEARLS PO), Take 1 capsule by mouth daily., Disp: , Rfl:  prochlorperazine (COMPAZINE) 10 MG tablet, Take 10 mg by mouth every 6 (six) hours as needed. For nausea, Disp: , Rfl: ;  ramipril (ALTACE) 1.25 MG capsule, Take 2 capsules (2.5 mg total) by mouth daily., Disp: , Rfl: ;  sitaGLIPtan (JANUVIA) 100 MG tablet, Take 100 mg by mouth at bedtime. , Disp: , Rfl: ;  traMADol (ULTRAM) 50 MG tablet, Take 1 tablet (50 mg total) by mouth every 6 (six) hours as needed., Disp: 40 tablet, Rfl: 0  BP 163/76  Pulse 92  Resp 16  Ht 5\' 4"  (1.626 m)  Wt 149 lb 8 oz (67.813 kg)  BMI 25.66 kg/m2  Body mass index is 25.66 kg/(m^2).       Physical exam a well-developed alert white female no acute distress. Respirations nonlabored Abdomen with a well-healed midline incision with no evidence of erythema and no evidence of hernia Femoral incisions healing nicely bilaterally with 2+ femoral pulses bilaterally Left foot well-perfused with 2+ posterior tibial pulse Right foot with the obvious progressive dry gangrene of her toes: Onto her foot. Her popliteal incision is healing completely and she does not have any calf tenderness.  Impression and plan a nonviable right foot with unreconstructable tibial disease. I have recommended amputation. I had an extremely long discussion with the patient and her husband present. I explained that from an anatomic standpoint she certainly is borderline for healing of below-knee amputation.  From physical exam standpoint she is completely healed her popliteal incision at this level and has no discomfort or evidence of necrotic tissue at this level. I explained the option of below-knee amputation with the estimated at 50% chance of healing versus nonhealing. Also discussed the option of an above-knee amputation with a very high degree of certainty of healing. They do understand the significant rehabilitation difference between above-knee versus below-knee and are quite active and enjoy travel. We have decided to proceed with attempted below-knee amputation. The patient understands that if at the time of surgery we discovered that the dictation at the site is nonviable that we would  proceed with above-knee amputation at that time. I did offer surgery for tomorrow the patient wishes to defer until the first of the week which we scheduled for 1021 at [facility]

## 2012-04-23 NOTE — Progress Notes (Signed)
Small  Amount bloody.. drainage on PICC dressing. Dressing Changed per protocol per request of husband.  Labs drawn from PICC per protocol and PICC line flushed  per protocol with normal saline and heparin 100 .units per ml  2.5 ml  Dr.Joslin in to see pt and evaluate breathing,okay for surgery.

## 2012-04-23 NOTE — Procedures (Signed)
L arm DL PICC No complication No blood loss. See complete dictation in [facility].

## 2012-04-23 NOTE — Progress Notes (Signed)
UR COMPLETED  

## 2012-04-23 NOTE — Interval H&P Note (Signed)
History and Physical Interval Note:  04/23/2012 1:15 PM  [name]  has presented today for surgery, with the diagnosis of Ischemia right foot Peripheral Arterial Disease  The various methods of treatment have been discussed with the patient and family. After consideration of risks, benefits and other options for treatment, the patient has consented to  Procedure(s) (LRB) with comments: AMPUTATION BELOW KNEE (Right) - Right below knee amputation; possible above knee amputation as a surgical intervention .  The patient's history has been reviewed, patient examined, no change in status, stable for surgery.  I have reviewed the patient's chart and labs.  Questions were answered to the patient's satisfaction.     [name]

## 2012-04-24 ENCOUNTER — Encounter (HOSPITAL_COMMUNITY): Payer: Self-pay | Admitting: Vascular Surgery

## 2012-04-24 DIAGNOSIS — S88119A Complete traumatic amputation at level between knee and ankle, unspecified lower leg, initial encounter: Secondary | ICD-10-CM

## 2012-04-24 DIAGNOSIS — L98499 Non-pressure chronic ulcer of skin of other sites with unspecified severity: Secondary | ICD-10-CM

## 2012-04-24 DIAGNOSIS — I739 Peripheral vascular disease, unspecified: Secondary | ICD-10-CM

## 2012-04-24 LAB — CBC
Hemoglobin: 8 g/dL — ABNORMAL LOW (ref 12.0–15.0)
MCHC: 31.4 g/dL (ref 30.0–36.0)
RDW: 17 % — ABNORMAL HIGH (ref 11.5–15.5)
WBC: 7.7 10*3/uL (ref 4.0–10.5)

## 2012-04-24 LAB — GLUCOSE, CAPILLARY: Glucose-Capillary: 152 mg/dL — ABNORMAL HIGH (ref 70–99)

## 2012-04-24 LAB — BASIC METABOLIC PANEL
BUN: 5 mg/dL — ABNORMAL LOW (ref 6–23)
Chloride: 97 mEq/L (ref 96–112)
GFR calc Af Amer: >90 mL/min (ref >90)
GFR calc non Af Amer: >90 mL/min (ref >90)
Potassium: 3.9 mEq/L (ref 3.5–5.1)

## 2012-04-24 MED ORDER — METFORMIN HCL 500 MG PO TABS
500.0000 mg | ORAL_TABLET | Freq: Two times a day (BID) | ORAL | Status: DC
  Filled 2012-04-24 (×3): qty 1

## 2012-04-24 MED ORDER — PIOGLITAZONE HCL-METFORMIN HCL 15-500 MG PO TABS
1.0000 | ORAL_TABLET | Freq: Two times a day (BID) | ORAL | Status: DC
  Filled 2012-04-24: qty 1

## 2012-04-24 MED ORDER — GLUCERNA SHAKE PO LIQD: 237.0000 mL | Freq: Every day | ORAL | Status: DC

## 2012-04-24 MED ORDER — PIOGLITAZONE HCL 15 MG PO TABS
15.0000 mg | ORAL_TABLET | Freq: Two times a day (BID) | ORAL | Status: DC
  Filled 2012-04-24 (×3): qty 1

## 2012-04-24 MED ORDER — PIOGLITAZONE HCL-METFORMIN HCL 15-500 MG PO TABS
1.0000 | ORAL_TABLET | Freq: Two times a day (BID) | ORAL | Status: DC
  Administered 2012-04-24 – 2012-04-26 (×4): 1 via ORAL

## 2012-04-24 MED ORDER — PIOGLITAZONE HCL-METFORMIN HCL 15-500 MG PO TABS: 1.0000 | ORAL_TABLET | Freq: Two times a day (BID) | ORAL | Status: DC

## 2012-04-24 NOTE — Consult Note (Signed)
Physical Medicine and Rehabilitation Consult Reason for Consult:  Right BKA Referring Physician: Dr. Arbie Cookey   HPI: [name] is a 66 y.o. right-handed female with history of diabetes mellitus with peripheral neuropathy, coronary artery disease with PTCA, peripheral vascular disease with multiple revascularization procedures. Patient recently discharged 04/09/2012 after aortobifemoral bypass as well as thrombectomy of right femoral to below knee popliteal Gore-Tex graft extension and umbilical hernia repair 03/26/2012 per Dr. Arbie Cookey with hospital course complicated by respiratory failure which required intubation as well as flash pulmonary edema with elevated troponin and NSTEMI with cardiac catheterization completed showing patency of left circumflex stent. Patient admitted 04/23/2012  and presented with progressive ischemic changes of the right foot as well as increased rest pain. 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 Arbie Cookey. Postoperative pain management. Placed on subcutaneous Lovenox for DVT prophylaxis. Physical and occupational therapy evaluations are pending. M.D. is requested physical medicine rehabilitation consult to consider inpatient rehabilitation services   Review of Systems  Cardiovascular: Positive for palpitations.  Gastrointestinal: Positive for constipation.  Musculoskeletal: Positive for myalgias.  Neurological: Positive for weakness.  Psychiatric/Behavioral:       Anxiety  All other systems reviewed and are negative.   Past Medical History  Diagnosis Date  . Hypertension     Unspecified  . Hyperlipidemia     Mixed  . Tobacco abuse     Remote  . Loose, teeth     has loose bridge and two loose teeth holding it  . PONV (postoperative nausea and vomiting)   . Type II diabetes mellitus   . GERD (gastroesophageal reflux disease)   . Anxiety   . Peripheral vascular disease     unspecified, a. s/p L CEA b. s/p  B fem-pop bypass  . Raynaud disease   . Stones in the urinary tract   . UTI (urinary tract infection)   . Anemia     hx  . Coronary artery disease     a. s/p NSTEMI 02/2009 - PCI LCX with Xience DES. Otherwise branch vessel and Dist. RCA dzs. NL EF.   Marland Kitchen Myocardial infarction   . CHF (congestive heart failure)   . Kidney stones   . UTI (lower urinary tract infection)   . Constipation   . Hernia, umbilical   . History of blood transfusion    Past Surgical History  Procedure Date  . Vesicovaginal fistula closure w/ tah   . Colonoscopy 11/2010  . Upper gastrointestinal endoscopy 11/2010  . Femoral artery - popliteal artery bypass graft     left  . Abdominal hysterectomy     complete  . Femoral-popliteal bypass graft 08/22/2011    Procedure: BYPASS GRAFT FEMORAL-POPLITEAL ARTERY;  Surgeon: Gretta Began, MD;  Location: [facility] OR;  Service: Vascular;  Laterality: Right;  Thrombectomy and Revision using 7mm x 10cm stretch goretex graft  . Intraoperative arteriogram 08/22/2011    Procedure: INTRA OPERATIVE ARTERIOGRAM;  Surgeon: Gretta Began, MD;  Location: [facility] OR;  Service: Vascular;  Laterality: Right;  to lower leg  . Multiple tooth extractions 08-29-2011    5 teeth extracted   . Pci 01/17/12    RLE  . Breast lumpectomy     right  . Tonsillectomy and adenoidectomy   . Coronary angioplasty with stent placement   . Carotid endarterectomy ~ 2005    left  . Aorta - bilateral femoral artery bypass graft 03/26/2012    Procedure: AORTA BIFEMORAL BYPASS GRAFT;  Surgeon: Larina Earthly, MD;  Location: [facility] OR;  Service: Vascular;  Laterality: N/A;  Aortic-bifemoral bypass using 14x4mm Hemashield graft .   . Umbilical hernia repair 03/26/2012    Procedure: HERNIA REPAIR UMBILICAL ADULT;  Surgeon: Larina Earthly, MD;  Location: [facility] OR;  Service: Vascular;  Laterality: N/A;  Removal of Umbilical hernia sac  . Femoral artery exploration 03/26/2012    Procedure: FEMORAL ARTERY EXPLORATION;  Surgeon: Larina Earthly, MD;   Location: [facility] OR;  Service: Vascular;  Laterality: Right;  with Revision of Popliteal-Peroneal bypass graft using 6mm x 10cm thin wall goretex graft   Family History  Problem Relation Age of Onset  . Heart attack Mother   . Heart attack Father   . Hypertension Brother   . Diabetes Brother   . Hypertension Brother    Social History:  reports that she quit smoking about 10 years ago. Her smoking use included Cigarettes. She has a 78 pack-year smoking history. She has never used smokeless tobacco. She reports that she drinks about 7 ounces of alcohol per week. She reports that she does not use illicit drugs. Allergies:  Allergies  Allergen Reactions  . Codeine Nausea And Vomiting  . Erythromycin Nausea And Vomiting  . Hydrocodone-Acetaminophen Nausea And Vomiting  . Hydromorphone Nausea And Vomiting  . Propoxyphene Hcl Nausea And Vomiting  . Statins Other (See Comments)    Leg cramps  . Acetaminophen Other (See Comments)    Does not tolerate well, nausea  . Eggs Or Egg-Derived Products     "pt does not eat"  . Ibuprofen Other (See Comments)    Does not tolerate well  . Shellfish-Derived Products     "pt does not eat"   Medications Prior to Admission  Medication Sig Dispense Refill  . ALPRAZolam (XANAX) 0.25 MG tablet Take 0.25 mg by mouth every morning.       Marland Kitchen aspirin 325 MG EC tablet Take 325 mg by mouth every morning.       . calcium carbonate (TUMS) 500 MG chewable tablet Chew 1-4 tablets by mouth daily as needed. For acid reflux.      . Cholecalciferol 25000 UNITS CAPS Take 25,000 Units by mouth 3 (three) times a week. Monday, Wednesday, friday      . clopidogrel (PLAVIX) 75 MG tablet Take 75 mg by mouth daily.      Marland Kitchen dexlansoprazole (DEXILANT) 60 MG capsule Take 60 mg by mouth 2 (two) times daily.      . furosemide (LASIX) 40 MG tablet Take 1 tablet (40 mg total) by mouth daily.  30 tablet  1  . glyBURIDE (DIABETA) 2.5 MG tablet Take 2.5 mg by mouth every morning.       .  iron polysaccharides (NIFEREX) 150 MG capsule Take 150 mg by mouth 2 (two) times daily.      . isosorbide mononitrate (IMDUR) 60 MG 24 hr tablet Take 1 tablet (60 mg total) by mouth daily.  30 tablet  1  . meperidine (DEMEROL) 50 MG tablet Take 50 mg by mouth every 4 (four) hours as needed. For pain      . metoprolol (LOPRESSOR) 50 MG tablet Take 1 tablet (50 mg total) by mouth 2 (two) times daily.  60 tablet  1  . nitroGLYCERIN (NITROSTAT) 0.4 MG SL tablet Place 0.4 mg under the tongue every 5 (five) minutes as needed. For chest pain      . omega-3 acid ethyl esters (LOVAZA) 1 G capsule  Take 2 g by mouth 2 (two) times daily.       . pioglitazone-metformin (ACTOPLUS MET) 15-500 MG per tablet Take 1 tablet by mouth daily. Brand name only-does not work if components are given separately per patient      . potassium chloride (K-DUR) 10 MEQ tablet Take 1 tablet (10 mEq total) by mouth 2 (two) times daily.  60 tablet  1  . Probiotic Product (ACIDOPHILUS PEARLS PO) Take 1 capsule by mouth daily.      . prochlorperazine (COMPAZINE) 10 MG tablet Take 10 mg by mouth every 6 (six) hours as needed. For nausea      . ramipril (ALTACE) 1.25 MG capsule Take 2 capsules (2.5 mg total) by mouth daily.      . sitaGLIPtan (JANUVIA) 100 MG tablet Take 100 mg by mouth at bedtime.         Home:    Functional History:   Functional Status:  Mobility:          ADL:    Cognition: Cognition Orientation Level: Oriented X4    Blood pressure 161/41, pulse 88, temperature 98.4 F (36.9 C), temperature source Oral, resp. rate 18, height 5\' 4"  (1.626 m), weight 64.456 kg (142 lb 1.6 oz), SpO2 93.00%. Physical Exam  Vitals reviewed. Constitutional: She is oriented to person, place, and time.  HENT:  Head: Normocephalic.  Eyes:       Pupils round and reactive to light  Neck: Neck supple. No thyromegaly present.  Cardiovascular:       Cardiac rate controlled  Pulmonary/Chest: Breath sounds normal. She has  no wheezes.  Abdominal: Bowel sounds are normal. She exhibits no distension. There is no tenderness.  Musculoskeletal:       Right leg in ACE wrap and appropriately tender. Expectedly swollen  Neurological: She is alert and oriented to person, place, and time. She has normal reflexes.       Follows simple commands. Strength in the [place] 5/5. LLE is grossly 4/5. Sensory exam decreased to LT below the lower calf. Cognitively quite alert and appropriate.  Skin:       Amputation site is dressed. Revascularization sites healing  Psychiatric: She has a normal mood and affect. Her behavior is normal. Judgment and thought content normal.    Results for orders placed during the hospital encounter of 04/23/12 (from the past 24 hour(s))  GLUCOSE, CAPILLARY     Status: Abnormal   Collection Time   04/23/12  9:51 AM      Component Value Range   Glucose-Capillary 144 (*) 70 - 99 mg/dL  SURGICAL PCR SCREEN     Status: Normal   Collection Time   04/23/12 10:16 AM      Component Value Range   MRSA, PCR NEGATIVE  NEGATIVE   Staphylococcus aureus NEGATIVE  NEGATIVE  BASIC METABOLIC PANEL     Status: Abnormal   Collection Time   04/23/12 10:17 AM      Component Value Range   Sodium 134 (*) 135 - 145 mEq/L   Potassium 3.7  3.5 - 5.1 mEq/L   Chloride 96  96 - 112 mEq/L   CO2 29  19 - 32 mEq/L   Glucose, Bld 137 (*) 70 - 99 mg/dL   BUN 6  6 - 23 mg/dL   Creatinine, Ser 0.45  0.50 - 1.10 mg/dL   Calcium 8.7  8.4 - 40.9 mg/dL   GFR calc non Af Amer >90  >90 mL/min  GFR calc Af Amer >90  >90 mL/min  CBC     Status: Abnormal   Collection Time   04/23/12 10:17 AM      Component Value Range   WBC 12.3 (*) 4.0 - 10.5 K/uL   RBC 3.09 (*) 3.87 - 5.11 MIL/uL   Hemoglobin 8.2 (*) 12.0 - 15.0 g/dL   HCT 16.1 (*) 09.6 - 04.5 %   MCV 82.8  78.0 - 100.0 fL   MCH 26.5  26.0 - 34.0 pg   MCHC 32.0  30.0 - 36.0 g/dL   RDW 40.9 (*) 81.1 - 91.4 %   Platelets 332  150 - 400 K/uL  GLUCOSE, CAPILLARY      Status: Abnormal   Collection Time   04/23/12 12:00 PM      Component Value Range   Glucose-Capillary 113 (*) 70 - 99 mg/dL  GLUCOSE, CAPILLARY     Status: Normal   Collection Time   04/23/12  2:43 PM      Component Value Range   Glucose-Capillary 94  70 - 99 mg/dL  CBC     Status: Abnormal   Collection Time   04/23/12  4:00 PM      Component Value Range   WBC 8.3  4.0 - 10.5 K/uL   RBC 3.00 (*) 3.87 - 5.11 MIL/uL   Hemoglobin 8.0 (*) 12.0 - 15.0 g/dL   HCT 78.2 (*) 95.6 - 21.3 %   MCV 83.0  78.0 - 100.0 fL   MCH 26.7  26.0 - 34.0 pg   MCHC 32.1  30.0 - 36.0 g/dL   RDW 08.6 (*) 57.8 - 46.9 %   Platelets 324  150 - 400 K/uL  CREATININE, SERUM     Status: Normal   Collection Time   04/23/12  4:00 PM      Component Value Range   Creatinine, Ser 0.54  0.50 - 1.10 mg/dL   GFR calc non Af Amer >90  >90 mL/min   GFR calc Af Amer >90  >90 mL/min  GLUCOSE, CAPILLARY     Status: Normal   Collection Time   04/23/12  4:39 PM      Component Value Range   Glucose-Capillary 94  70 - 99 mg/dL   Comment 1 Documented in Chart     Comment 2 Notify RN    GLUCOSE, CAPILLARY     Status: Abnormal   Collection Time   04/23/12  9:13 PM      Component Value Range   Glucose-Capillary 131 (*) 70 - 99 mg/dL   Comment 1 Documented in Chart     Comment 2 Notify RN    GLUCOSE, CAPILLARY     Status: Abnormal   Collection Time   04/24/12  5:49 AM      Component Value Range   Glucose-Capillary 135 (*) 70 - 99 mg/dL   Ir Fluoro Guide Cv Line Left  04/23/2012  * RADIOLOGY REPORT *  Clinical data: Peripheral vascular disease, pre amputation. Peripheral access needed.  PICC PLACEMENT WITH ULTRASOUND AND FLUOROSCOPY  Technique: After written informed consent was obtained, patient was placed in the supine position on angiographic table. Patency of the left brachial vein was confirmed with ultrasound with image documentation. An appropriate skin site was determined. Skin site was marked. Region was prepped  using maximum barrier technique including cap and mask, sterile gown, sterile gloves, large sterile sheet, and Chlorhexidine   as cutaneous antisepsis.  The region was infiltrated locally with  1% lidocaine.   Under real-time ultrasound guidance, the left brachial vein was accessed with a 21 gauge micropuncture needle; the needle tip within the vein was confirmed with ultrasound image documentation.   Needle exchanged over a 018 guidewire for a peel-away sheath, through which a 5-French double- lumen power injectable PICC trimmed to 43cm was advanced, positioned with its tip near the cavoatrial junction.  Spot chest radiograph confirms appropriate catheter position.  Catheter was flushed per protocol and secured externally with 0-Prolene sutures. The patient tolerated procedure well, with no immediate complication.  IMPRESSION: Technically successful five [place] double lumen power injectable PICC placement   Original Report Authenticated By: Osa Craver, M.D.    Ir US Guide Vasc Access Left  04/23/2012  * RADIOLOGY REPORT *  Clinical data: Peripheral vascular disease, pre amputation. Peripheral access needed.  PICC PLACEMENT WITH ULTRASOUND AND FLUOROSCOPY  Technique: After written informed consent was obtained, patient was placed in the supine position on angiographic table. Patency of the left brachial vein was confirmed with ultrasound with image documentation. An appropriate skin site was determined. Skin site was marked. Region was prepped using maximum barrier technique including cap and mask, sterile gown, sterile gloves, large sterile sheet, and Chlorhexidine   as cutaneous antisepsis.  The region was infiltrated locally with 1% lidocaine.   Under real-time ultrasound guidance, the left brachial vein was accessed with a 21 gauge micropuncture needle; the needle tip within the vein was confirmed with ultrasound image documentation.   Needle exchanged over a 018 guidewire for a peel-away sheath,  through which a 5-French double- lumen power injectable PICC trimmed to 43cm was advanced, positioned with its tip near the cavoatrial junction.  Spot chest radiograph confirms appropriate catheter position.  Catheter was flushed per protocol and secured externally with 0-Prolene sutures. The patient tolerated procedure well, with no immediate complication.  IMPRESSION: Technically successful five [place] double lumen power injectable PICC placement   Original Report Authenticated By: Osa Craver, M.D.     Assessment/Plan: Diagnosis: right BKA, diabetic peripheral neuropathy 1. Does the need for close, 24 hr/day medical supervision in concert with the patient's rehab needs make it unreasonable for this patient to be served in a less intensive setting? Potentially 2. Co-Morbidities requiring supervision/potential complications: dm, cad, peripheral neuropathy 3. Due to bladder management, bowel management, safety, skin/wound care, disease management, medication administration, pain management and patient education, does the patient require 24 hr/day rehab nursing? Potentially 4. Does the patient require coordinated care of a physician, rehab nurse, PT (1-2 hrs/day, 5 days/week) and OT (1-2 hrs/day, 5 days/week) to address physical and functional deficits in the context of the above medical diagnosis(es)? Potentially Addressing deficits in the following areas: balance, endurance, locomotion, strength, transferring, bowel/bladder control, bathing, dressing, feeding, grooming and toileting 5. Can the patient actively participate in an intensive therapy program of at least 3 hrs of therapy per day at least 5 days per week? Yes 6. The potential for patient to make measurable gains while on inpatient rehab is excellent 7. Anticipated functional outcomes upon discharge from inpatient rehab are mod I to supervision with PT, mod I to supervision with OT, n/a with SLP. 8. Estimated rehab length of stay to  reach the above functional goals is: ?7-10 days 9. Does the patient have adequate social supports to accommodate these discharge functional goals? Yes 10. Anticipated D/C setting: Home 11. Anticipated post D/C treatments: HH therapy 12. Overall Rehab/Functional Prognosis: excellent  RECOMMENDATIONS: This patient's  condition is appropriate for continued rehabilitative care in the following setting: CIR pending therapy evals/progress Patient has agreed to participate in recommended program. Potentially Note that insurance prior authorization may be required for reimbursement for recommended care.  Comment: Pt admittedly with balance issues PTA which I suspect were largely related to her DPN. Most likely would benefit from the inpatient rehab setting to maximize safety, improve balance and mobility. Rehab RN to follow up.   Ivory Broad, MD     04/24/2012

## 2012-04-24 NOTE — Progress Notes (Signed)
BP 200/77; pt given 10mg  IV Hydralazine at this time; will cont. To monitor BP.

## 2012-04-24 NOTE — Progress Notes (Signed)
SBP 215 at this time after Hydralazine given 1 hr ago; pt in pain at this time; will administer IV dilaudid and recheck BP in 30 min; will cont. To monitor.

## 2012-04-24 NOTE — Evaluation (Signed)
Physical Therapy Evaluation Patient Details Name: [name] MRN: [id number] DOB: 04-14-1946 Today's Date: 04/24/2012 Time: 1308-6578 PT Time Calculation (min): 17 min  PT Assessment / Plan / Recommendation Clinical Impression  Pt s/p rt BKA.  Pt moving fairly well but anxious and needs to go slowly.  Expect she will make steady progress.  Recommend inpatient rehab before dc home with family.    PT Assessment  Patient needs continued PT services    Follow Up Recommendations  Post acute inpatient    Does the patient have the potential to tolerate intense rehabilitation   Yes, Recommend IP Rehab Screening  Barriers to Discharge        Equipment Recommendations  Wheelchair (measurements)    Recommendations for Other Services     Frequency Min 5X/week    Precautions / Restrictions Precautions Precautions: Fall   Pertinent Vitals/Pain Pain rt leg (6/10). Pt premedicated.      Mobility  Bed Mobility Bed Mobility: Sit to Supine;Scooting to HOB Supine to Sit: 4: Min assist Sitting - Scoot to Edge of Bed: 3: Mod assist Sit to Supine: 4: Min guard;HOB flat;With rail Scooting to [facility]: 4: Min guard;With rail (bed in trendelenburg and cues for technique) Details for Bed Mobility Assistance: Pt comes up into long sitting and then scoots to EOB.    Shoulder Instructions     Exercises     PT Diagnosis: Difficulty walking;Generalized weakness;Acute pain  PT Problem List: Decreased strength;Decreased range of motion;Decreased activity tolerance;Decreased balance;Decreased mobility;Decreased knowledge of use of DME;Pain PT Treatment Interventions: DME instruction;Gait training;Functional mobility training;Therapeutic activities;Therapeutic exercise;Balance training;Patient/family education;Wheelchair mobility training   PT Goals Acute Rehab PT Goals PT Goal Formulation: With patient Time For Goal Achievement: 05/01/12 Potential to Achieve Goals: Good Pt will go Supine/Side  to Sit: with modified independence PT Goal: Supine/Side to Sit - Progress: Goal set today Pt will go Sit to Supine/Side: with modified independence PT Goal: Sit to Supine/Side - Progress: Goal set today Pt will go Sit to Stand: with supervision PT Goal: Sit to Stand - Progress: Goal set today Pt will go Stand to Sit: with supervision PT Goal: Stand to Sit - Progress: Goal set today Pt will Transfer Bed to Chair/Chair to Bed: with supervision PT Transfer Goal: Bed to Chair/Chair to Bed - Progress: Goal set today Pt will Stand: with supervision;1 - 2 min;with bilateral upper extremity support PT Goal: Stand - Progress: Goal set today Pt will Ambulate: 1 - 15 feet;with min assist;with least restrictive assistive device PT Goal: Ambulate - Progress: Goal set today Pt will Propel Wheelchair: 51 - 150 feet;with supervision PT Goal: Propel Wheelchair - Progress: Goal set today  Visit Information  Last PT Received On: 04/24/12 Assistance Needed: +2 (for OOB)    Subjective Data  Subjective: "I need to go slow," pt stated about moving. Patient Stated Goal: Return home   Prior Functioning  Home Living Lives With: Spouse Available Help at Discharge: Family Type of Home: House Home Access: Stairs to enter Secretary/administrator of Steps: 3 Entrance Stairs-Rails: Right Home Layout: One level Bathroom Shower/Tub: Walk-in shower;Door [employer] Toilet: Standard Home Adaptive Equipment: Environmental consultant - rolling;Built-in shower seat Prior Function Level of Independence: Independent with assistive device(s) Driving: No (Not since surgery in Sept) Comments: Since DC from hospital pt amb with rolling walker Communication Communication: No difficulties Dominant Hand: Right    Cognition  Overall Cognitive Status: Appears within functional limits for tasks assessed/performed Arousal/Alertness: Awake/alert Orientation Level: Appears intact for tasks assessed  Behavior During Session: Anxious Cognition  - Other Comments: Asked to go slow and have things explained step by step    Extremity/Trunk Assessment Right Lower Extremity Assessment RLE ROM/Strength/Tone: Deficits;Due to pain RLE ROM/Strength/Tone Deficits: Able to move hip against gravity with assist.  Poor quad set currently.  -15 degrees from full knee extension.  Pt/daughter educated on placement of pillow under lower leg and not under knee to promote knee extension. Left Lower Extremity Assessment LLE ROM/Strength/Tone: WFL for tasks assessed   Balance Static Sitting Balance Static Sitting - Balance Support: Bilateral upper extremity supported Static Sitting - Level of Assistance: 5: Stand by assistance Static Sitting - Comment/# of Minutes: sat x 5-6 minutes  End of Session PT - End of Session Activity Tolerance: Other (comment) (self limiting) Patient left: in bed;with call bell/phone within reach;with family/visitor present Nurse Communication: Mobility status  GP     Roda Lauture 04/24/2012, 2:12 PM  [facility] PT [phone]

## 2012-04-24 NOTE — Progress Notes (Signed)
Rehab Admissions Coordinator Note:  Patient screening  by Brock Ra for appropriateness for an Inpatient Acute Rehab Consult deferred since consult has been done.  Will f/up tomorrow about readiness for CIR. Richardson Dopp, Sherene Sires 04/24/2012, 2:26 PM  I can be reached at [phone].

## 2012-04-24 NOTE — Progress Notes (Signed)
OT Cancellation Note  Patient Details Name: [name] MRN: [id number] DOB: 12/23/1945   Cancelled Treatment:    Reason Eval/Treat Not Completed: Pain limiting ability to participate.  Pt too anxious to participate.  Requesting for therapy to come back tomorrow.  Katlynne Mckercher 04/24/2012, 2:53 PM

## 2012-04-24 NOTE — Care Management Note (Signed)
    Page 1 of 1   04/27/2012     11:49:05 AM   CARE MANAGEMENT NOTE 04/27/2012  Patient:  [name], [name]   Account Number:  [id number]  Date Initiated:  04/24/2012  Documentation initiated by:  Deklyn Gibbon  Subjective/Objective Assessment:   PT S/P RT BKA ON 04/23/12. PTA, PT INDEPENDENT, LIVES WITH SPOUSE.     Action/Plan:   CIR CONSULT IN PROGRESS.  HOPEFUL FOR ADMISSION TO INPT REHAB.   Anticipated DC Date:  04/25/2012   Anticipated DC Plan:  IP REHAB FACILITY  In-house referral  Clinical Social Worker      DC Planning Services  CM consult      Choice offered to / List presented to:             Status of service:  Completed, signed off Medicare Important Message given?   (If response is "NO", the following Medicare IM given date fields will be blank) Date Medicare IM given:   Date Additional Medicare IM given:    Discharge Disposition:  IP REHAB FACILITY  Per UR Regulation:  Reviewed for med. necessity/level of care/duration of stay  If discussed at Long Length of Stay Meetings, dates discussed:    Comments:  04/26/12 Jaysen Wey,RN,BSN [phone] PT ACCEPTED TO CIR; TRANSFERRED TO [facility].  10/24 10a debbie dowell rn,bsn sw working on snf for rehab.

## 2012-04-24 NOTE — Progress Notes (Signed)
INITIAL ADULT NUTRITION ASSESSMENT Date: 04/24/2012   Time: 3:43 PM   INTERVENTION:  Glucerna Shake daily (220 kcals, 9.9 gm protein per 8 fl oz can) RD to follow for nutrition care plan  DOCUMENTATION CODES Per approved criteria  -Severe malnutrition in the context of acute illness or injury   Reason for Assessment: Malnutrition Screening Tool Report  ASSESSMENT: Female 66 y.o.  Dx: right foot ischemia  Hx:  Past Medical History  Diagnosis Date  . Hypertension     Unspecified  . Hyperlipidemia     Mixed  . Tobacco abuse     Remote  . Loose, teeth     has loose bridge and two loose teeth holding it  . PONV (postoperative nausea and vomiting)   . Type II diabetes mellitus   . GERD (gastroesophageal reflux disease)   . Anxiety   . Peripheral vascular disease     unspecified, a. s/p L CEA b. s/p B fem-pop bypass  . Raynaud disease   . Stones in the urinary tract   . UTI (urinary tract infection)   . Anemia     hx  . Coronary artery disease     a. s/p NSTEMI 02/2009 - PCI LCX with Xience DES. Otherwise branch vessel and Dist. RCA dzs. NL EF.   Marland Kitchen Myocardial infarction   . CHF (congestive heart failure)   . Kidney stones   . UTI (lower urinary tract infection)   . Constipation   . Hernia, umbilical   . History of blood transfusion     Related Meds:     . ALPRAZolam  0.25 mg Oral q morning - 10a  . aspirin  325 mg Oral q morning - 10a  . cefUROXime (ZINACEF)  IV  1.5 g Intravenous Q12H  . clopidogrel  75 mg Oral Daily  . dexlansoprazole  60 mg Oral BID  . docusate sodium  100 mg Oral Daily  . enoxaparin (LOVENOX) injection  40 mg Subcutaneous Q24H  . furosemide  40 mg Oral Daily  . glyBURIDE  2.5 mg Oral q morning - 10a  . HYDROmorphone      . insulin aspart  0-15 Units Subcutaneous TID WC  . iron polysaccharides  150 mg Oral BID  . isosorbide mononitrate  60 mg Oral Daily  . linagliptin  5 mg Oral QHS  . metoprolol  50 mg Oral BID  . mupirocin ointment    Nasal BID  . pioglitazone-metformin  1 tablet Oral BID WC  . potassium chloride  10 mEq Oral BID  . ramipril  2.5 mg Oral Daily  . sodium chloride  10-40 mL Intracatheter Q12H  . Vitamin D (Ergocalciferol)  50,000 Units Oral Q7 days  . DISCONTD: Cholecalciferol  25,000 Units Oral 3 times weekly  . DISCONTD: metFORMIN  500 mg Oral Q breakfast  . DISCONTD: metFORMIN  500 mg Oral BID WC  . DISCONTD: pioglitazone  15 mg Oral Q breakfast  . DISCONTD: pioglitazone  15 mg Oral BID WC  . DISCONTD: pioglitazone-metformin  1 tablet Oral Daily  . DISCONTD: pioglitazone-metformin  1 tablet Oral BID WC  . DISCONTD: pioglitazone-metformin  1 tablet Oral BID WC  . DISCONTD: pioglitazone-metformin  1 tablet Oral BID WC    Ht: 5\' 4"  (162.6 cm)  Wt: 142 lb 1.6 oz (64.456 kg)  Ideal Wt: 51.3 kg -- adjusted for BKA % Ideal Wt: 125%  Usual Wt: 74 kg -- September 2013 % Usual Wt: 86%  BMI =  26.0 kg/m2 -- adjusted for BKA  Food/Nutrition Related Hx: recent weight lost without trying per admission nutrition screen  Labs:  CMP     Component Value Date/Time   NA 136 04/24/2012 0500   K 3.9 04/24/2012 0500   CL 97 04/24/2012 0500   CO2 27 04/24/2012 0500   GLUCOSE 134* 04/24/2012 0500   BUN 5* 04/24/2012 0500   CREATININE 0.46* 04/24/2012 0500   CALCIUM 8.5 04/24/2012 0500   PROT 5.0* 03/27/2012 0341   ALBUMIN 2.5* 03/27/2012 0341   AST 26 03/27/2012 0341   ALT 10 03/27/2012 0341   ALKPHOS 50 03/27/2012 0341   BILITOT 0.3 03/27/2012 0341   GFRNONAA >90 04/24/2012 0500   GFRAA >90 04/24/2012 0500     Intake/Output Summary (Last 24 hours) at 04/24/12 1544 Last data filed at 04/24/12 1500  Gross per 24 hour  Intake   1708 ml  Output    200 ml  Net   1508 ml    CBG (last 3)   Basename 04/24/12 1121 04/24/12 0549 04/23/12 2113  GLUCAP 152* 135* 131*    Diet Order: Carb Control  Supplements/Tube Feeding: N/A  IVF:    sodium chloride Last Rate: 75 mL/hr at 04/24/12 1500     Estimated Nutritional Needs:   Kcal: 1800-2000 Protein: 90-100 gm Fluid: 1.8-2.0 L  Patient known to this RD with previous hospitalization on SICU (2300) -- discharged 10/7 after aortobifemoral bypass as well as thrombectomy of right femoral to below knee popliteal Gore-Tex graft extension; patient re-admitted presenting with progressive ischemic changes of the right foot; 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  Patient had poor oral intake (0-50%) during previous hospital course, however, at this time states her appetite is "getting better every day;" she is reluctant to take Ensure, however, her husband is amenable to RD ordering Glucerna Shake for patient to try; per flowsheet records, patient has lost approximately 23 lb since her surgery in September.  Patient meets criteria for severe malnutrition in the context of acute illness or injury given < 50% intake of estimated energy requirement for > 5 days and 14% weight loss x 1 month.  NUTRITION DIAGNOSIS: -Increased nutrient needs (NI-5.1).  Status: Ongoing  RELATED TO: post-op healing  AS EVIDENCE BY: estimated nutrition needs  MONITORING/EVALUATION(Goals): Goal:Oral intake with meals & supplements to meet >/= 90% of estimated nutrition needs Monitor: PO & supplemental intake, weight, labs, I/O's  EDUCATION NEEDS: -No education needs identified at this time  Dietitian #: [phone]  Kirkland Hun, RD, LDN Pager #: [phone] After-Hours Pager #: [phone]

## 2012-04-24 NOTE — Progress Notes (Signed)
Subjective: Interval History: none.. Comfortable  Objective: Vital signs in last 24 hours: Temp:  [97.3 F (36.3 C)-98.4 F (36.9 C)] 98.4 F (36.9 C) (10/22 0402) Pulse Rate:  [68-88] 88  (10/22 0402) Resp:  [14-18] 18  (10/22 0402) BP: (124-166)/(41-67) 161/41 mmHg (10/22 0402) SpO2:  [92 %-100 %] 93 % (10/22 0402) Weight:  [142 lb 1.6 oz (64.456 kg)] 142 lb 1.6 oz (64.456 kg) (10/21 1536)  Intake/Output from previous day: 10/21 0701 - 10/22 0700 In: 1480 [P.O.:480; I.V.:750; IV Piggyback:250] Out: 300 [Urine:200; Blood:100] Intake/Output this shift:    BKA dressing intact. mild tenderness  Lab Results:  Basename 04/24/12 0500 04/23/12 1600  WBC 7.7 8.3  HGB 8.0* 8.0*  HCT 25.5* 24.9*  PLT 333 324   BMET  Basename 04/24/12 0500 04/23/12 1600 04/23/12 1017  NA 136 -- 134*  K 3.9 -- 3.7  CL 97 -- 96  CO2 27 -- 29  GLUCOSE 134* -- 137*  BUN 5* -- 6  CREATININE 0.46* 0.54 --  CALCIUM 8.5 -- 8.7    Studies/Results: Dg Chest 2 View  04/08/2012  *RADIOLOGY REPORT*  Clinical Data: Congestive heart failure.  CHEST - 2 VIEW  Comparison: 04/05/2012 and 03/31/2012.  Findings: Right arm PICC is unchanged at the lower SVC level.  The heart size and mediastinal contours are stable.  Pulmonary edema has nearly completely resolved.  There are small left greater than right pleural effusions with associated mild bibasilar atelectasis. Calcifications overlap the left humeral head, suspicious for hydroxyapatite deposition.  IMPRESSION:  1.  Near complete resolution of pulmonary edema. 2.  Left greater than right pleural effusions with associated bibasilar atelectasis. 3.  Question left shoulder calcific tendonitis/bursitis.   Original Report Authenticated By: Gerrianne Scale, M.D.    Ir Fluoro Guide Cv Line Left  04/23/2012  * RADIOLOGY REPORT *  Clinical data: Peripheral vascular disease, pre amputation. Peripheral access needed.  PICC PLACEMENT WITH ULTRASOUND AND FLUOROSCOPY   Technique: After written informed consent was obtained, patient was placed in the supine position on angiographic table. Patency of the left brachial vein was confirmed with ultrasound with image documentation. An appropriate skin site was determined. Skin site was marked. Region was prepped using maximum barrier technique including cap and mask, sterile gown, sterile gloves, large sterile sheet, and Chlorhexidine   as cutaneous antisepsis.  The region was infiltrated locally with 1% lidocaine.   Under real-time ultrasound guidance, the left brachial vein was accessed with a 21 gauge micropuncture needle; the needle tip within the vein was confirmed with ultrasound image documentation.   Needle exchanged over a 018 guidewire for a peel-away sheath, through which a 5-French double- lumen power injectable PICC trimmed to 43cm was advanced, positioned with its tip near the cavoatrial junction.  Spot chest radiograph confirms appropriate catheter position.  Catheter was flushed per protocol and secured externally with 0-Prolene sutures. The patient tolerated procedure well, with no immediate complication.  IMPRESSION: Technically successful five [place] double lumen power injectable PICC placement   Original Report Authenticated By: Osa Craver, M.D.    Ir US Guide Vasc Access Left  04/23/2012  * RADIOLOGY REPORT *  Clinical data: Peripheral vascular disease, pre amputation. Peripheral access needed.  PICC PLACEMENT WITH ULTRASOUND AND FLUOROSCOPY  Technique: After written informed consent was obtained, patient was placed in the supine position on angiographic table. Patency of the left brachial vein was confirmed with ultrasound with image documentation. An appropriate skin site was determined.  Skin site was marked. Region was prepped using maximum barrier technique including cap and mask, sterile gown, sterile gloves, large sterile sheet, and Chlorhexidine   as cutaneous antisepsis.  The region was  infiltrated locally with 1% lidocaine.   Under real-time ultrasound guidance, the left brachial vein was accessed with a 21 gauge micropuncture needle; the needle tip within the vein was confirmed with ultrasound image documentation.   Needle exchanged over a 018 guidewire for a peel-away sheath, through which a 5-French double- lumen power injectable PICC trimmed to 43cm was advanced, positioned with its tip near the cavoatrial junction.  Spot chest radiograph confirms appropriate catheter position.  Catheter was flushed per protocol and secured externally with 0-Prolene sutures. The patient tolerated procedure well, with no immediate complication.  IMPRESSION: Technically successful five [place] double lumen power injectable PICC placement   Original Report Authenticated By: Osa Craver, M.D.    Dg Chest Port 1 View  04/05/2012  *RADIOLOGY REPORT*  Clinical Data: Shortness of breath with low O2 sats.  PORTABLE CHEST - 1 VIEW  Comparison: 03/31/2012  Findings: 2304 hours. The cardiopericardial silhouette is enlarged. Interval development of patchy asymmetric airspace disease in the left upper lobe and both bases. The cardiopericardial silhouette is enlarged.  Vascular congestion persist.  Right PICC line tip is noted overlying the mid SVC level. Telemetry leads overlie the chest.  IMPRESSION: Cardiomegaly with interstitial and patchy areas of asymmetric airspace disease.  Features suggest evolving asymmetric pulmonary edema.  Diffuse infection is also a possibility.   Original Report Authenticated By: ERIC A. MANSELL, M.D.    Dg Chest Port 1 View  03/31/2012  *RADIOLOGY REPORT*  Clinical Data: Pulmonary edema.  PORTABLE CHEST - 1 VIEW  Comparison: Chest x-ray 03/31/2012.  Findings: Right IJ Cordis in place with tip in the distal internal jugular vein.  Lung volumes are normal. There is cephalization of the pulmonary vasculature and slight indistinctness of the interstitial markings suggestive of mild  pulmonary edema.  No consolidative airspace disease.  No pleural effusions.  Heart size is borderline to mildly enlarged.  Atherosclerosis in the thoracic aorta.  IMPRESSION: 1.  Borderline to mildly enlarged cardiac silhouette with mild interstitial pulmonary edema. 2.  Atherosclerosis.   Original Report Authenticated By: Florencia Reasons, M.D.    Dg Chest Port 1 View  03/31/2012  *RADIOLOGY REPORT*  Clinical Data: Difficulty breathing.  PORTABLE CHEST - 1 VIEW  Comparison: 03/30/2012  Findings: Endotracheal tube and nasogastric tube have been removed. Again noted is a right jugular central line with the tip in the right jugular vein region.  There are increased interstitial densities and increased basilar densities.  Findings are concerning for interstitial edema and pleural effusions.  Heart size is stable.  IMPRESSION: Increasing interstitial pulmonary edema.  Increased basilar densities are suggestive for atelectasis and pleural fluid.   Original Report Authenticated By: Richarda Overlie, M.D.    Dg Chest Port 1 View  03/30/2012  *RADIOLOGY REPORT*  Clinical Data: Evaluate endotracheal tube position  PORTABLE CHEST - 1 VIEW  Comparison: 03/29/2012.  Findings: A right internal jugular introducer sheath is in place. An orogastric tube is in place with the tip located projecting over the region of the gastric fundus.  The side port is positioned overlying the distal esophagus and this could be advanced up to 5 cm to ensure intragastric positioning of the side port if desired. The endotracheal tube tip is located in the proximal trachea, 6.5 cm above the level  of the carina.  Heart and mediastinal contours are unchanged with mild ectasia of the thoracic aorta with intraaortic calcification.  Pulmonary vascular congestion with interstitial prominence, small left pleural effusion and interstitial septal lines is again most suspicious for interstitial pulmonary edema.  More focal basilar density is seen today at the  left lung base with relative clearing of the density seen previously at the right lung base and I suspect that this represents shifting basilar positional alveolar edema combined with atelectasis.  Bony structures remain intact.  IMPRESSION: Lines and tubes as above.  Findings most compatible with interstitial edema with shifting basilar edema pattern and probable left lower lobe volume loss.   Original Report Authenticated By: Bertha Stakes, M.D.    Dg Chest Port 1 View  03/29/2012  *RADIOLOGY REPORT*  Clinical Data: Pulmonary edema  PORTABLE CHEST - 1 VIEW  Comparison: 03/28/2012  Findings: Endotracheal tube tip projects 5.5 cm proximal to the carina.  NG tube descends below the level of the image.  Heart size upper normal to mildly enlarged.  Central vascular congestion. Interstitial and more confluent airspace opacities within the lung bases, mildly increased in the interval.  Right IJ sheath.  No pneumothorax.  Small effusion not excluded on the left.  No interval osseous change.  IMPRESSION: Interstitial and right greater than left airspace opacities, in keeping with pulmonary edema (favored) versus multifocal pneumonia. The airspace opacities have increased mildly in the interval.  Appropriately positioned support devices as above.   Original Report Authenticated By: Waneta Martins, M.D.    Portable Chest Xray  03/28/2012  *RADIOLOGY REPORT*  Clinical Data: 66 year old female intubated.  PORTABLE CHEST - 1 VIEW  Comparison: 0640 hours the same day and earlier.  Findings: Portable semi upright AP view 2338 hours.  And tracheal tube now in place, tip in good position between level of clavicles and carina.  Enteric tube now in position, courses to the left upper quadrant and tip not included.  Stable right IJ introducer sheath.   Stable cardiac size and mediastinal contours.  Increased confluent opacity at the right lung base superimposed on diffuse increased interstitial markings.  No  pneumothorax or large effusion.  IMPRESSION: 1.  Lines and tubes in good position as above. 2.  New confluent right infrahilar opacity could reflect aspiration or pneumonia.   Original Report Authenticated By: Harley Hallmark, M.D.    Dg Chest Port 1 View  03/28/2012  *RADIOLOGY REPORT*  Clinical Data: Postop.  Atelectasis.  PORTABLE CHEST - 1 VIEW  Comparison: 03/27/2012  Findings: Interval extubation and removal of Swan-Ganz catheter and NG tube.  Improving bibasilar aeration with decreasing atelectasis. Heart is borderline in size.  Diffuse interstitial prominence throughout the lungs, possible mild interstitial edema.  IMPRESSION: Improving bibasilar aeration following extubation.  Continued interstitial prominence may reflect interstitial edema.   Original Report Authenticated By: Cyndie Chime, M.D.    Dg Chest Port 1 View  03/27/2012  *RADIOLOGY REPORT*  Clinical Data: Status post aorto bifemoral bypass and umbilical hernia repair.  PORTABLE CHEST - 1 VIEW  Comparison: 03/26/2012 and 03/21/2012.  Findings: 0649 hours.  The endotracheal tube, nasogastric tube and Swan-Ganz catheter are unchanged.  There is increased left lower lobe atelectasis and a small left pleural effusion.  No pneumothorax or pulmonary edema is seen.  Heart size and mediastinal contours are stable.  Calcifications are noted adjacent to the left shoulder suggesting hydroxyapatite deposition.  IMPRESSION:  1.  Increased left lower lobe  atelectasis and small left pleural effusion. 2.  Otherwise stable postoperative chest with stable support system.   Original Report Authenticated By: Gerrianne Scale, M.D.    Dg Chest Portable 1 View  03/26/2012  *RADIOLOGY REPORT*  Clinical Data: Postop central line placement.  PORTABLE CHEST - 1 VIEW  Comparison: 03/21/2012  Findings: A right jugular Swan-Ganz catheter is seen with tip in the proximal right pulmonary artery.  Endotracheal tube and nasogastric tube are seen in appropriate  position.  No pneumothorax identified.  Both lungs are clear.  Heart size is stable.  IMPRESSION:  No active lung disease.  No pneumothorax identified.   Original Report Authenticated By: Danae Orleans, M.D.    Dg Abd Portable 1v  03/26/2012  *RADIOLOGY REPORT*  Clinical Data: 66 year old female status post stent placement.  PORTABLE ABDOMEN - 1 VIEW  Comparison: CT 02/03/1812.  Findings: Portable supine view 1746 hours.  Enteric tube partially visible tip at the level of the gastric body.  Mild motion artifact.  Calcified atherosclerosis, severe in the pelvis and about the proximal lower extremities.  Bilateral common iliac artery stent re-identified.  Right external iliac artery stent re- identified.  Partial visualization of a stent in the proximal right lower extremity.  Paucity of bowel gas, blurred by motion. Questionable gas in the bladder, may reflect recent catheter placement.  IMPRESSION: 1.  Advanced pelvic and proximal femoral atherosclerosis with multiple vascular stent in place. 2.  Suggestion of gas within the bladder, query recent Foley placement/removal. 3.  Motion artifact affecting the visualized bowel gas.   Original Report Authenticated By: Harley Hallmark, M.D.    Anti-infectives: Anti-infectives     Start     Dose/Rate Route Frequency Ordered Stop   04/24/12 0000   cefUROXime (ZINACEF) 1.5 g in dextrose 5 % 50 mL IVPB        1.5 g 100 mL/hr over 30 Minutes Intravenous Every 12 hours 04/23/12 1522 04/24/12 2359   04/22/12 1434   cefUROXime (ZINACEF) 1.5 g in dextrose 5 % 50 mL IVPB        1.5 g 100 mL/hr over 30 Minutes Intravenous 60 min pre-op 04/22/12 1434 04/23/12 1330          Assessment/Plan: s/p Procedure(s) (LRB) with comments: AMPUTATION BELOW KNEE (Right) Stable POD 1. Check wd in AM   LOS: 1 day   [name] 04/24/2012, 7:39 AM

## 2012-04-25 ENCOUNTER — Encounter: Payer: Medicare Other | Admitting: Physician Assistant

## 2012-04-25 LAB — CBC
HCT: 27.5 % — ABNORMAL LOW (ref 36.0–46.0)
Hemoglobin: 9 g/dL — ABNORMAL LOW (ref 12.0–15.0)
WBC: 12.5 10*3/uL — ABNORMAL HIGH (ref 4.0–10.5)

## 2012-04-25 LAB — GLUCOSE, CAPILLARY
Glucose-Capillary: 166 mg/dL — ABNORMAL HIGH (ref 70–99)
Glucose-Capillary: 172 mg/dL — ABNORMAL HIGH (ref 70–99)
Glucose-Capillary: 148 mg/dL — ABNORMAL HIGH (ref 70–99)

## 2012-04-25 LAB — BASIC METABOLIC PANEL
CO2: 28 mEq/L (ref 19–32)
Chloride: 93 mEq/L — ABNORMAL LOW (ref 96–112)
GFR calc Af Amer: >90 mL/min (ref >90)
Potassium: 3.2 mEq/L — ABNORMAL LOW (ref 3.5–5.1)

## 2012-04-25 MED ORDER — MEPERIDINE HCL 50 MG PO TABS
50.0000 mg | ORAL_TABLET | ORAL | Status: DC | PRN
  Administered 2012-04-25 – 2012-04-26 (×6): 50 mg via ORAL
  Filled 2012-04-25 (×6): qty 1

## 2012-04-25 MED ORDER — RAMIPRIL 2.5 MG PO CAPS
2.5000 mg | ORAL_CAPSULE | Freq: Two times a day (BID) | ORAL | Status: DC
  Administered 2012-04-25 – 2012-04-26 (×3): 2.5 mg via ORAL
  Filled 2012-04-25 (×4): qty 1

## 2012-04-25 MED ORDER — ALPRAZOLAM 0.25 MG PO TABS
0.2500 mg | ORAL_TABLET | Freq: Three times a day (TID) | ORAL | Status: DC | PRN
  Administered 2012-04-25 – 2012-04-26 (×2): 0.25 mg via ORAL
  Filled 2012-04-25 (×2): qty 1

## 2012-04-25 MED ORDER — RAMIPRIL 2.5 MG PO CAPS
2.5000 mg | ORAL_CAPSULE | Freq: Two times a day (BID) | ORAL | Status: DC
  Administered 2012-04-25: 2.5 mg via ORAL
  Filled 2012-04-25: qty 1

## 2012-04-25 MED ORDER — MORPHINE SULFATE 2 MG/ML IJ SOLN
2.0000 mg | INTRAMUSCULAR | Status: DC | PRN
  Filled 2012-04-25: qty 1

## 2012-04-25 NOTE — Progress Notes (Signed)
This RN was called to room by Pt, Spouse by bedside requested pain med for Pt, RN advised and gave options to Patient on Pain medication, Pt opted to try Tramadol PO 50 mg, spouse intervened and requested for IV dilaudid, patient insisted she would rather take the Tramadol and not Dilaudid, Spouse was upset at RN and said Tramadol was not a Pain medication and also requesting to have IV dilaudid and Tramadol PO administered to the Pt (wife) at same time. Pt was advised of her options and she insisted on taking Tramadol PO only at this time. Rn administered Tramadol 50 mg and Pt made comfortable, will continue to monitor .

## 2012-04-25 NOTE — Progress Notes (Signed)
Pt vomited red/brownish emesis this AM. Antiemetic Medication given. Will continue to monitor.

## 2012-04-25 NOTE — Clinical Social Work Psychosocial (Signed)
Clinical Social Work Department BRIEF PSYCHOSOCIAL ASSESSMENT 04/25/2012  Patient:  [name], [name]     Account Number:  [id number]     Admit date:  04/23/2012  Clinical Social Worker:  Thomasene Mohair  Date/Time:  04/25/2012 03:00 PM  Referred by:  RN  Date Referred:  04/25/2012 Referred for  SNF Placement   Other Referral:   Interview type:  Patient Other interview type:   Family    PSYCHOSOCIAL DATA Living Status:  HUSBAND Admitted from facility:   Level of care:   Primary support name:  Deari Sessler Primary support relationship to patient:  SPOUSE Degree of support available:   strong    CURRENT CONCERNS Current Concerns  Post-Acute Placement  Adjustment to Illness   Other Concerns:    SOCIAL WORK ASSESSMENT / PLAN CSW was referred to Pt to assist with dc planning. Pt lives at home with spouse, is retired.  Pt has a new AKA and is planning to go to inpt rehab at dc, but showed difficulty with therapies today, was noted to be "self-limiting." Pt and Pt's spouse are hopeful that Pt will still be able to tolerate CIR, but agree to a second plan of SNF for rehab if necessary.  CSW will begin a SNF bed search and notify family of availability.   Assessment/plan status:  Psychosocial Support/Ongoing Assessment of Needs Other assessment/ plan:   Continue to discuss dc plan with multidicisplinary team at rounds   Information/referral to community resources:   List of SNFs for [facility]    PATIENT'S/FAMILY'S RESPONSE TO PLAN OF CARE: Pt's spouse upset over the possibility of Pt going to SNF. After much discussion, he verbalized understanding that she would not be accepted into a rehab program that she might not tolerate.  Pt seemed more understanding of the need for a back-up plan and was agreeable to CSW beginning a bed search.    Frederico Hamman, LCSW [phone]

## 2012-04-25 NOTE — Progress Notes (Signed)
Physical Therapy Treatment Patient Details Name: [name] MRN: [id number] DOB: 12/12/1945 Today's Date: 04/25/2012 Time: 1050-1104 PT Time Calculation (min): 14 min  PT Assessment / Plan / Recommendation Comments on Treatment Session  Pt s/p rt BKA.  Pt moves fairly well but is very self limiting.  Refused to get to chair.  Expect will do better in the more structured rehab setting.    Follow Up Recommendations  Post acute inpatient     Does the patient have the potential to tolerate intense rehabilitation  Yes, Recommend IP Rehab Screening  Barriers to Discharge        Equipment Recommendations  Wheelchair (measurements)    Recommendations for Other Services    Frequency Min 5X/week   Plan Discharge plan remains appropriate;Frequency remains appropriate    Precautions / Restrictions Precautions Precautions: Fall   Pertinent Vitals/Pain Pt reports 10/10 pain in rt leg    Mobility  Bed Mobility Supine to Sit: 4: Min guard Sitting - Scoot to Edge of Bed: 4: Min guard Sit to Supine: 5: Supervision;HOB flat Scooting to HOB: 1: +2 Total assist Scooting to [facility]: Patient Percentage: 30% Transfers Sit to Stand: 1: +2 Total assist;With upper extremity assist;From bed;Other (comment) (from [facility] with hands in front pulling up on Stedy) Sit to Stand: Patient Percentage: 80% Stand to Sit: 1: +2 Total assist;With upper extremity assist;To bed Stand to Sit: Patient Percentage: 80% Transfer via Lift Equipment: Stedy Details for Transfer Assistance: Pt refused transfer to chair.    Exercises     PT Diagnosis:    PT Problem List:   PT Treatment Interventions:     PT Goals Acute Rehab PT Goals PT Goal: Supine/Side to Sit - Progress: Progressing toward goal PT Goal: Sit to Supine/Side - Progress: Progressing toward goal PT Goal: Sit to Stand - Progress: Progressing toward goal PT Goal: Stand to Sit - Progress: Progressing toward goal PT Goal: Stand - Progress: Progressing  toward goal  Visit Information  Last PT Received On: 04/25/12 Assistance Needed: +2 PT/OT Co-Evaluation/Treatment: Yes    Subjective Data  Subjective: "I need some warning you are coming so I can get my pain medicine."  Nurse techs had told pt we were coming and pt had been given pain meds prior to Rx.   Cognition  Overall Cognitive Status: Appears within functional limits for tasks assessed/performed Arousal/Alertness: Awake/alert Orientation Level: Appears intact for tasks assessed Behavior During Session: Anxious Cognition - Other Comments: Pt self limiting with activity.    Balance  Static Sitting Balance Static Sitting - Balance Support: Bilateral upper extremity supported Static Sitting - Level of Assistance: 5: Stand by assistance Static Sitting - Comment/# of Minutes: sat EOB x 5 minutes Static Standing Balance Static Standing - Balance Support: Bilateral upper extremity supported (on Stedy) Static Standing - Level of Assistance: 4: Min assist Static Standing - Comment/# of Minutes: Stood x 15-20 sec x 2 with Stedy  End of Session PT - End of Session Equipment Utilized During Treatment: Gait belt Activity Tolerance: Other (comment) (Pt self limiting) Patient left: in bed;with call bell/phone within reach Nurse Communication: Mobility status   GP     [name] 04/25/2012, 12:11 PM  [employer] PT [phone]

## 2012-04-25 NOTE — Discharge Summary (Signed)
[facility] Discharge Summary   Patient ID:  [name] MRN: [id number] DOB/AGE: 09/26/1945 66 y.o.  Admit date: 04/23/2012 Discharge date: 04/26/12 Date of Surgery: 04/23/2012 Surgeon: Surgeon(s): Larina Earthly, MD  Admission Diagnosis: peripheral vascular disease Ischemia right foot  Discharge Diagnoses:  peripheral vascular disease Ischemia right foot  Secondary Diagnoses: Past Medical History  Diagnosis Date  . Hypertension     Unspecified  . Hyperlipidemia     Mixed  . Tobacco abuse     Remote  . Loose, teeth     has loose bridge and two loose teeth holding it  . PONV (postoperative nausea and vomiting)   . Type II diabetes mellitus   . GERD (gastroesophageal reflux disease)   . Anxiety   . Peripheral vascular disease     unspecified, a. s/p L CEA b. s/p B fem-pop bypass  . Raynaud disease   . Stones in the urinary tract   . UTI (urinary tract infection)   . Anemia     hx  . Coronary artery disease     a. s/p NSTEMI 02/2009 - PCI LCX with Xience DES. Otherwise branch vessel and Dist. RCA dzs. NL EF.   Marland Kitchen Myocardial infarction   . CHF (congestive heart failure)   . Kidney stones   . UTI (lower urinary tract infection)   . Constipation   . Hernia, umbilical   . History of blood transfusion     Procedure(s): AMPUTATION BELOW KNEE  Discharged Condition: good  HPI:  The patient was seen in followup of a recent extensive hospitalization for attempted limb salvage. She underwent aortobifemoral bypass and attempted revascularization into her peroneal artery on the right leg. She had a postoperative complication with pulmonary edema and reintubation and elevated cardiac enzymes. She underwent a cardiac catheterization prior to discharge showing no evidence of triple coronary disease. She did have ongoing profound ischemia of her right foot with no outflow into her foot with discussion of need for amputation. The patient  wanted to be  discharged with some time interval. I explained prior to discharge at the 2 factors that would necessitate a dictation would be progressive tissue loss or intolerable pain. She is having significant right foot rest pain and is treating this with oral Demerol. Pt was admitted for right BKA.   Hospital Course:  [name] is a 66 y.o. female is S/P Right AMPUTATION BELOW KNEE Extubated: POD # 0 Post-op wounds healing well Stump is viable Pt. Ambulating, voiding and taking PO diet without difficulty. Pt pain controlled with PO pain meds. Labs as below Pt is being sent to CIR for intensive rehab Complications:none  Consults: CIR/PT/OT   Significant Diagnostic Studies: CBC Lab Results  Component Value Date   WBC 12.5* 04/25/2012   HGB 9.0* 04/25/2012   HCT 27.5* 04/25/2012   MCV 81.8 04/25/2012   PLT 385 04/25/2012    BMET    Component Value Date/Time   NA 135 04/25/2012 0500   K 3.2* 04/25/2012 0500   CL 93* 04/25/2012 0500   CO2 28 04/25/2012 0500   GLUCOSE 178* 04/25/2012 0500   BUN 3* 04/25/2012 0500   CREATININE 0.37* 04/25/2012 0500   CALCIUM 8.7 04/25/2012 0500   GFRNONAA >90 04/25/2012 0500   GFRAA >90 04/25/2012 0500   COAG Lab Results  Component Value Date   INR 1.18 03/26/2012   INR 0.93 03/21/2012   INR 0.91 08/22/2011     Disposition:  Discharge to :Rehab  Discharge Orders    Future Appointments: Provider: Department: Dept Phone: Center:   05/22/2012 10:00 AM [facility] [phone] [facility]   05/22/2012 10:30 AM [facility] [phone] [facility]   05/22/2012 11:00 AM Larina Earthly, MD [facility] [phone] [facility]      Joanne Gavel, [place] Medication Instructions [id number]:[id number]   Printed on:04/25/12 1420  Medication Information                    pioglitazone-metformin (ACTOPLUS MET) 15-500 MG per tablet Take 1 tablet by mouth daily. Brand name only-does not work if components are given separately per patient            ALPRAZolam (XANAX) 0.25 MG tablet Take 0.25 mg by mouth every morning.            glyBURIDE (DIABETA) 2.5 MG tablet Take 2.5 mg by mouth every morning.            sitaGLIPtan (JANUVIA) 100 MG tablet Take 100 mg by mouth at bedtime.            omega-3 acid ethyl esters (LOVAZA) 1 G capsule Take 2 g by mouth 2 (two) times daily.            nitroGLYCERIN (NITROSTAT) 0.4 MG SL tablet Place 0.4 mg under the tongue every 5 (five) minutes as needed. For chest pain           calcium carbonate (TUMS) 500 MG chewable tablet Chew 1-4 tablets by mouth daily as needed. For acid reflux.           dexlansoprazole (DEXILANT) 60 MG capsule Take 60 mg by mouth 2 (two) times daily.           prochlorperazine (COMPAZINE) 10 MG tablet Take 10 mg by mouth every 6 (six) hours as needed. For nausea           Probiotic Product (ACIDOPHILUS PEARLS PO) Take 1 capsule by mouth daily.           iron polysaccharides (NIFEREX) 150 MG capsule Take 150 mg by mouth 2 (two) times daily.           meperidine (DEMEROL) 50 MG tablet Take 50 mg by mouth every 4 (four) hours as needed. For pain           Cholecalciferol 25000 UNITS CAPS Take 25,000 Units by mouth 3 (three) times a week. Monday, Wednesday, friday           aspirin 325 MG EC tablet Take 325 mg by mouth every morning.            furosemide (LASIX) 40 MG tablet Take 1 tablet (40 mg total) by mouth daily.           isosorbide mononitrate (IMDUR) 60 MG 24 hr tablet Take 1 tablet (60 mg total) by mouth daily.           metoprolol (LOPRESSOR) 50 MG tablet Take 1 tablet (50 mg total) by mouth 2 (two) times daily.           ramipril (ALTACE) 1.25 MG capsule Take 2 capsules (2.5 mg total) by mouth daily.           potassium chloride (K-DUR) 10 MEQ tablet Take 1 tablet (10 mEq total) by mouth 2 (two) times daily.           clopidogrel (PLAVIX) 75 MG tablet Take 75 mg by mouth daily.  Verbal and written Discharge instructions  given to the patient. Wound care per Discharge AVS  F/U Dr. Arbie Cookey for staple removal in 4 weeks  Signed: Marlowe Shores 04/25/2012, 2:20 PM

## 2012-04-25 NOTE — Evaluation (Signed)
Occupational Therapy Evaluation Patient Details Name: [name] MRN: [id number] DOB: 1946-02-06 Today's Date: 04/25/2012 Time: 1050-1104 OT Time Calculation (min): 14 min  OT Assessment / Plan / Recommendation Clinical Impression  This 66 y.o. female admitted for Rt. BKA.  Pt. is very anxious and requires encouragement to participate.  Pt will benefit from OT to maximize safety and independence with BADLs to allow pt to return to min A to modified independent level after CIR    OT Assessment  Patient needs continued OT Services    Follow Up Recommendations  Inpatient Rehab;Supervision/Assistance - 24 hour    Barriers to Discharge None    Equipment Recommendations  Wheelchair (measurements)    Recommendations for Other Services    Frequency  Min 2X/week    Precautions / Restrictions Precautions Precautions: Fall Restrictions Weight Bearing Restrictions: No       ADL  Eating/Feeding: Independent Where Assessed - Eating/Feeding: Bed level Grooming: Simulated;Minimal assistance Where Assessed - Grooming: Unsupported sitting Upper Body Bathing: Simulated;Minimal assistance Where Assessed - Upper Body Bathing: Unsupported sitting Lower Body Bathing: Maximal assistance Where Assessed - Lower Body Bathing: Supported sit to stand Upper Body Dressing: Simulated;Minimal assistance Where Assessed - Upper Body Dressing: Unsupported sitting Lower Body Dressing: Simulated;+1 Total assistance Where Assessed - Lower Body Dressing: Supported sit to stand Toilet Transfer: Simulated;+2 Total assistance Toilet Transfer: Patient Percentage: 80% Statistician Method: Sit to Barista: Other (comment) (with steady lift) Toileting - Clothing Manipulation and Hygiene: Simulated;+1 Total assistance Where Assessed - Glass blower/designer Manipulation and Hygiene: Standing Equipment Used: Other (comment) (steady lift) ADL Comments: Pt. very anxious.  Would not engage  fully in ADL activity    OT Diagnosis: Generalized weakness;Acute pain  OT Problem List: Decreased strength;Decreased activity tolerance;Impaired balance (sitting and/or standing);Decreased safety awareness;Decreased knowledge of use of DME or AE;Pain OT Treatment Interventions: Self-care/ADL training;DME and/or AE instruction;Therapeutic activities;Visual/perceptual remediation/compensation;Patient/family education;Balance training   OT Goals Acute Rehab OT Goals OT Goal Formulation: With patient Time For Goal Achievement: 05/09/12 Potential to Achieve Goals: Good ADL Goals Pt Will Perform Grooming: with min assist;Standing at sink ADL Goal: Grooming - Progress: Goal set today Pt Will Perform Upper Body Bathing: with set-up;Sitting, edge of bed ADL Goal: Upper Body Bathing - Progress: Goal set today Pt Will Perform Lower Body Bathing: with supervision;Sit to stand from bed ADL Goal: Lower Body Bathing - Progress: Goal set today Pt Will Perform Upper Body Dressing: with set-up;Sitting, bed ADL Goal: Upper Body Dressing - Progress: Goal set today Pt Will Perform Lower Body Dressing: with supervision;Sit to stand from bed ADL Goal: Lower Body Dressing - Progress: Goal set today Pt Will Transfer to Toilet: with min assist;Ambulation;with DME ADL Goal: Toilet Transfer - Progress: Goal set today Pt Will Perform Toileting - Clothing Manipulation: with supervision;Standing ADL Goal: Toileting - Clothing Manipulation - Progress: Goal set today Pt Will Perform Toileting - Hygiene: with supervision;Leaning right and/or left on 3-in-1/toilet ADL Goal: Toileting - Hygiene - Progress: Goal set today Pt Will Perform Tub/Shower Transfer: Shower transfer;Ambulation;with min assist ADL Goal: Tub/Shower Transfer - Progress: Goal set today  Visit Information  Last OT Received On: 04/25/12 Assistance Needed: +2 PT/OT Co-Evaluation/Treatment: Yes    Subjective Data  Subjective: "I just have to lie  down.  I just need to rest" Patient Stated Goal: To sleep   Prior Functioning     Home Living Lives With: Spouse Available Help at Discharge: Family Type of Home: House Home Access: Stairs to  enter Entrance Stairs-Number of Steps: 3 Entrance Stairs-Rails: Right Home Layout: One level Bathroom Shower/Tub: Walk-in shower;Door [employer] Toilet: Standard Bathroom Accessibility: Yes How Accessible: Accessible via walker Home Adaptive Equipment: Walker - rolling;Built-in shower seat Prior Function Level of Independence: Independent with assistive device(s) Able to Take Stairs?: Yes Driving: No Comments: Since D/C from hospital pt ambulating with RW Communication Communication: No difficulties Dominant Hand: Right         Vision/Perception     Cognition  Overall Cognitive Status: Appears within functional limits for tasks assessed/performed Area of Impairment: Attention (due to severe anxiety) Arousal/Alertness: Awake/alert Orientation Level: Appears intact for tasks assessed Behavior During Session: Anxious Current Attention Level: Selective Attention - Other Comments: Pt self distracts Cognition - Other Comments: Pt self limiting with activity.    Extremity/Trunk Assessment Right Upper Extremity Assessment RUE ROM/Strength/Tone: WFL for tasks assessed RUE Coordination: WFL - gross/fine motor Left Upper Extremity Assessment LUE ROM/Strength/Tone: WFL for tasks assessed LUE Coordination: WFL - gross/fine motor Trunk Assessment Trunk Assessment: Normal     Mobility Bed Mobility Bed Mobility: Sit to Supine;Scooting to HOB Supine to Sit: 4: Min guard Sitting - Scoot to [place] of Bed: 4: Min guard Sit to Supine: 5: Supervision;HOB flat Scooting to HOB: 1: +2 Total assist Scooting to [facility]: Patient Percentage: 30% Transfers Transfers: Sit to Stand;Stand to Sit Sit to Stand:  (from Steady with hands in front pulling up on steady) Sit to Stand: Patient Percentage:  80% Stand to Sit: 1: +2 Total assist;With upper extremity assist;To bed Stand to Sit: Patient Percentage: 80% Transfer via Lift Equipment: Stedy Details for Transfer Assistance: Pt refused transfer to chair.     Shoulder Instructions     Exercise     Balance Balance Balance Assessed: Yes Static Sitting Balance Static Sitting - Balance Support: Bilateral upper extremity supported Static Sitting - Level of Assistance: 5: Stand by assistance Static Sitting - Comment/# of Minutes: sat EOB x 5 minutes Static Standing Balance Static Standing - Balance Support: Bilateral upper extremity supported Static Standing - Level of Assistance: 4: Min assist Static Standing - Comment/# of Minutes: stood x 15-20 sec x 2 with Stedy   End of Session OT - End of Session Equipment Utilized During Treatment: Gait belt;Other (comment) (stedy) Activity Tolerance: Other (comment) (secondary to anxiety) Patient left: in bed;with call bell/phone within reach Nurse Communication: Mobility status  GO     Mecca Barga, Ursula Alert M 04/25/2012, 12:31 PM

## 2012-04-25 NOTE — Progress Notes (Signed)
Pt vomiting this am.  She is agreeable to CIR.  However, she has not been up yet w/ therapy.  PT & OT plan co-treat today.  Ottie Glazier, CIR admissions, to follow up tomorrow about possible admit to CIR Thurs or Friday.   Barbara's contact#  [phone].

## 2012-04-25 NOTE — Progress Notes (Signed)
Subjective: Interval History: none..   Objective: Vital signs in last 24 hours: Temp:  [98.3 F (36.8 C)-99.2 F (37.3 C)] 99.2 F (37.3 C) (10/23 0354) Pulse Rate:  [95-114] 111  (10/23 0506) Resp:  [18] 18  (10/23 0354) BP: (153-211)/(65-77) 153/66 mmHg (10/23 0610) SpO2:  [94 %-96 %] 96 % (10/23 0506)  Intake/Output from previous day: 10/22 0701 - 10/23 0700 In: 1768 [P.O.:960; I.V.:808] Out: -  Intake/Output this shift: Total I/O In: 240 [P.O.:240] Out: -   B. Joyce Gross a dressing removed. Excellent healing with no evidence of skin breakdown.  Lab Results:  Basename 04/25/12 0500 04/24/12 0500  WBC 12.5* 7.7  HGB 9.0* 8.0*  HCT 27.5* 25.5*  PLT 385 333   BMET  Basename 04/25/12 0500 04/24/12 0500  NA 135 136  K 3.2* 3.9  CL 93* 97  CO2 28 27  GLUCOSE 178* 134*  BUN 3* 5*  CREATININE 0.37* 0.46*  CALCIUM 8.7 8.5    Studies/Results: Dg Chest 2 View  04/08/2012  *RADIOLOGY REPORT*  Clinical Data: Congestive heart failure.  CHEST - 2 VIEW  Comparison: 04/05/2012 and 03/31/2012.  Findings: Right arm PICC is unchanged at the lower SVC level.  The heart size and mediastinal contours are stable.  Pulmonary edema has nearly completely resolved.  There are small left greater than right pleural effusions with associated mild bibasilar atelectasis. Calcifications overlap the left humeral head, suspicious for hydroxyapatite deposition.  IMPRESSION:  1.  Near complete resolution of pulmonary edema. 2.  Left greater than right pleural effusions with associated bibasilar atelectasis. 3.  Question left shoulder calcific tendonitis/bursitis.   Original Report Authenticated By: Gerrianne Scale, M.D.    Ir Fluoro Guide Cv Line Left  04/23/2012  * RADIOLOGY REPORT *  Clinical data: Peripheral vascular disease, pre amputation. Peripheral access needed.  PICC PLACEMENT WITH ULTRASOUND AND FLUOROSCOPY  Technique: After written informed consent was obtained, patient was placed in the  supine position on angiographic table. Patency of the left brachial vein was confirmed with ultrasound with image documentation. An appropriate skin site was determined. Skin site was marked. Region was prepped using maximum barrier technique including cap and mask, sterile gown, sterile gloves, large sterile sheet, and Chlorhexidine   as cutaneous antisepsis.  The region was infiltrated locally with 1% lidocaine.   Under real-time ultrasound guidance, the left brachial vein was accessed with a 21 gauge micropuncture needle; the needle tip within the vein was confirmed with ultrasound image documentation.   Needle exchanged over a 018 guidewire for a peel-away sheath, through which a 5-French double- lumen power injectable PICC trimmed to 43cm was advanced, positioned with its tip near the cavoatrial junction.  Spot chest radiograph confirms appropriate catheter position.  Catheter was flushed per protocol and secured externally with 0-Prolene sutures. The patient tolerated procedure well, with no immediate complication.  IMPRESSION: Technically successful five [place] double lumen power injectable PICC placement   Original Report Authenticated By: Osa Craver, M.D.    Ir US Guide Vasc Access Left  04/23/2012  * RADIOLOGY REPORT *  Clinical data: Peripheral vascular disease, pre amputation. Peripheral access needed.  PICC PLACEMENT WITH ULTRASOUND AND FLUOROSCOPY  Technique: After written informed consent was obtained, patient was placed in the supine position on angiographic table. Patency of the left brachial vein was confirmed with ultrasound with image documentation. An appropriate skin site was determined. Skin site was marked. Region was prepped using maximum barrier technique including cap and mask,  sterile gown, sterile gloves, large sterile sheet, and Chlorhexidine   as cutaneous antisepsis.  The region was infiltrated locally with 1% lidocaine.   Under real-time ultrasound guidance, the left  brachial vein was accessed with a 21 gauge micropuncture needle; the needle tip within the vein was confirmed with ultrasound image documentation.   Needle exchanged over a 018 guidewire for a peel-away sheath, through which a 5-French double- lumen power injectable PICC trimmed to 43cm was advanced, positioned with its tip near the cavoatrial junction.  Spot chest radiograph confirms appropriate catheter position.  Catheter was flushed per protocol and secured externally with 0-Prolene sutures. The patient tolerated procedure well, with no immediate complication.  IMPRESSION: Technically successful five [place] double lumen power injectable PICC placement   Original Report Authenticated By: Osa Craver, M.D.    Dg Chest Port 1 View  04/05/2012  *RADIOLOGY REPORT*  Clinical Data: Shortness of breath with low O2 sats.  PORTABLE CHEST - 1 VIEW  Comparison: 03/31/2012  Findings: 2304 hours. The cardiopericardial silhouette is enlarged. Interval development of patchy asymmetric airspace disease in the left upper lobe and both bases. The cardiopericardial silhouette is enlarged.  Vascular congestion persist.  Right PICC line tip is noted overlying the mid SVC level. Telemetry leads overlie the chest.  IMPRESSION: Cardiomegaly with interstitial and patchy areas of asymmetric airspace disease.  Features suggest evolving asymmetric pulmonary edema.  Diffuse infection is also a possibility.   Original Report Authenticated By: ERIC A. MANSELL, M.D.    Dg Chest Port 1 View  03/31/2012  *RADIOLOGY REPORT*  Clinical Data: Pulmonary edema.  PORTABLE CHEST - 1 VIEW  Comparison: Chest x-ray 03/31/2012.  Findings: Right IJ Cordis in place with tip in the distal internal jugular vein.  Lung volumes are normal. There is cephalization of the pulmonary vasculature and slight indistinctness of the interstitial markings suggestive of mild pulmonary edema.  No consolidative airspace disease.  No pleural effusions.  Heart  size is borderline to mildly enlarged.  Atherosclerosis in the thoracic aorta.  IMPRESSION: 1.  Borderline to mildly enlarged cardiac silhouette with mild interstitial pulmonary edema. 2.  Atherosclerosis.   Original Report Authenticated By: Florencia Reasons, M.D.    Dg Chest Port 1 View  03/31/2012  *RADIOLOGY REPORT*  Clinical Data: Difficulty breathing.  PORTABLE CHEST - 1 VIEW  Comparison: 03/30/2012  Findings: Endotracheal tube and nasogastric tube have been removed. Again noted is a right jugular central line with the tip in the right jugular vein region.  There are increased interstitial densities and increased basilar densities.  Findings are concerning for interstitial edema and pleural effusions.  Heart size is stable.  IMPRESSION: Increasing interstitial pulmonary edema.  Increased basilar densities are suggestive for atelectasis and pleural fluid.   Original Report Authenticated By: Richarda Overlie, M.D.    Dg Chest Port 1 View  03/30/2012  *RADIOLOGY REPORT*  Clinical Data: Evaluate endotracheal tube position  PORTABLE CHEST - 1 VIEW  Comparison: 03/29/2012.  Findings: A right internal jugular introducer sheath is in place. An orogastric tube is in place with the tip located projecting over the region of the gastric fundus.  The side port is positioned overlying the distal esophagus and this could be advanced up to 5 cm to ensure intragastric positioning of the side port if desired. The endotracheal tube tip is located in the proximal trachea, 6.5 cm above the level of the carina.  Heart and mediastinal contours are unchanged with mild ectasia of the  thoracic aorta with intraaortic calcification.  Pulmonary vascular congestion with interstitial prominence, small left pleural effusion and interstitial septal lines is again most suspicious for interstitial pulmonary edema.  More focal basilar density is seen today at the left lung base with relative clearing of the density seen previously at the right  lung base and I suspect that this represents shifting basilar positional alveolar edema combined with atelectasis.  Bony structures remain intact.  IMPRESSION: Lines and tubes as above.  Findings most compatible with interstitial edema with shifting basilar edema pattern and probable left lower lobe volume loss.   Original Report Authenticated By: Bertha Stakes, M.D.    Dg Chest Port 1 View  03/29/2012  *RADIOLOGY REPORT*  Clinical Data: Pulmonary edema  PORTABLE CHEST - 1 VIEW  Comparison: 03/28/2012  Findings: Endotracheal tube tip projects 5.5 cm proximal to the carina.  NG tube descends below the level of the image.  Heart size upper normal to mildly enlarged.  Central vascular congestion. Interstitial and more confluent airspace opacities within the lung bases, mildly increased in the interval.  Right IJ sheath.  No pneumothorax.  Small effusion not excluded on the left.  No interval osseous change.  IMPRESSION: Interstitial and right greater than left airspace opacities, in keeping with pulmonary edema (favored) versus multifocal pneumonia. The airspace opacities have increased mildly in the interval.  Appropriately positioned support devices as above.   Original Report Authenticated By: Waneta Martins, M.D.    Portable Chest Xray  03/28/2012  *RADIOLOGY REPORT*  Clinical Data: 66 year old female intubated.  PORTABLE CHEST - 1 VIEW  Comparison: 0640 hours the same day and earlier.  Findings: Portable semi upright AP view 2338 hours.  And tracheal tube now in place, tip in good position between level of clavicles and carina.  Enteric tube now in position, courses to the left upper quadrant and tip not included.  Stable right IJ introducer sheath.   Stable cardiac size and mediastinal contours.  Increased confluent opacity at the right lung base superimposed on diffuse increased interstitial markings.  No pneumothorax or large effusion.  IMPRESSION: 1.  Lines and tubes in good position as above.  2.  New confluent right infrahilar opacity could reflect aspiration or pneumonia.   Original Report Authenticated By: Harley Hallmark, M.D.    Dg Chest Port 1 View  03/28/2012  *RADIOLOGY REPORT*  Clinical Data: Postop.  Atelectasis.  PORTABLE CHEST - 1 VIEW  Comparison: 03/27/2012  Findings: Interval extubation and removal of Swan-Ganz catheter and NG tube.  Improving bibasilar aeration with decreasing atelectasis. Heart is borderline in size.  Diffuse interstitial prominence throughout the lungs, possible mild interstitial edema.  IMPRESSION: Improving bibasilar aeration following extubation.  Continued interstitial prominence may reflect interstitial edema.   Original Report Authenticated By: Cyndie Chime, M.D.    Dg Chest Port 1 View  03/27/2012  *RADIOLOGY REPORT*  Clinical Data: Status post aorto bifemoral bypass and umbilical hernia repair.  PORTABLE CHEST - 1 VIEW  Comparison: 03/26/2012 and 03/21/2012.  Findings: 0649 hours.  The endotracheal tube, nasogastric tube and Swan-Ganz catheter are unchanged.  There is increased left lower lobe atelectasis and a small left pleural effusion.  No pneumothorax or pulmonary edema is seen.  Heart size and mediastinal contours are stable.  Calcifications are noted adjacent to the left shoulder suggesting hydroxyapatite deposition.  IMPRESSION:  1.  Increased left lower lobe atelectasis and small left pleural effusion. 2.  Otherwise stable postoperative chest with stable support  system.   Original Report Authenticated By: Gerrianne Scale, M.D.    Dg Chest Portable 1 View  03/26/2012  *RADIOLOGY REPORT*  Clinical Data: Postop central line placement.  PORTABLE CHEST - 1 VIEW  Comparison: 03/21/2012  Findings: A right jugular Swan-Ganz catheter is seen with tip in the proximal right pulmonary artery.  Endotracheal tube and nasogastric tube are seen in appropriate position.  No pneumothorax identified.  Both lungs are clear.  Heart size is stable.  IMPRESSION:   No active lung disease.  No pneumothorax identified.   Original Report Authenticated By: Danae Orleans, M.D.    Dg Abd Portable 1v  03/26/2012  *RADIOLOGY REPORT*  Clinical Data: 66 year old female status post stent placement.  PORTABLE ABDOMEN - 1 VIEW  Comparison: CT 02/03/1812.  Findings: Portable supine view 1746 hours.  Enteric tube partially visible tip at the level of the gastric body.  Mild motion artifact.  Calcified atherosclerosis, severe in the pelvis and about the proximal lower extremities.  Bilateral common iliac artery stent re-identified.  Right external iliac artery stent re- identified.  Partial visualization of a stent in the proximal right lower extremity.  Paucity of bowel gas, blurred by motion. Questionable gas in the bladder, may reflect recent catheter placement.  IMPRESSION: 1.  Advanced pelvic and proximal femoral atherosclerosis with multiple vascular stent in place. 2.  Suggestion of gas within the bladder, query recent Foley placement/removal. 3.  Motion artifact affecting the visualized bowel gas.   Original Report Authenticated By: Harley Hallmark, M.D.    Anti-infectives: Anti-infectives     Start     Dose/Rate Route Frequency Ordered Stop   04/24/12 0000   cefUROXime (ZINACEF) 1.5 g in dextrose 5 % 50 mL IVPB        1.5 g 100 mL/hr over 30 Minutes Intravenous Every 12 hours 04/23/12 1522 04/24/12 1230   04/22/12 1434   cefUROXime (ZINACEF) 1.5 g in dextrose 5 % 50 mL IVPB        1.5 g 100 mL/hr over 30 Minutes Intravenous 60 min pre-op 04/22/12 1434 04/23/12 1330          Assessment/Plan: s/p Procedure(s) (LRB) with comments: AMPUTATION BELOW KNEE (Right) Staples postop day 2 with good Naviyah Schaffert healing of BKA. Again discussed with the wife and husband this is a good sign but certainly still concerned about her ability to heel this. I feel that she is acceptable for C. IR transfer at any time from a medical standpoint   LOS: 2 days   Ranessa Kosta 04/25/2012,  11:48 AM

## 2012-04-25 NOTE — PMR Pre-admission (Signed)
PMR Admission Coordinator Pre-Admission Assessment  Patient: [name] is an 66 y.o., female MRN: [id number] DOB: [birth year] Height: 5\' 4"  (162.6 cm) Weight: 64.456 kg (142 lb 1.6 oz)  Insurance Information HMO:  PPO: PCP: IPA:  80/20: yes     OTHER:  No HMO PRIMARY: Medicare      Policy#: [id number]     Subscriber: pt Benefits:  Phone #:       Name: [place] Date: A & B 02/01/10     Deduct: $1184.00      Out of Pocket Max: none      Life Max: none CIR: 100%      SNF: 20 days 100% days 21-100 $148.00/day Outpatient: 80%     Co-Pay: 20% Home Health: 100%      Co-Pay: 20% any DME used DME: 80%     Co-Pay: 20% Providers: pt's choice  SECONDARY: [employer] of [place]      Policy#: [id number]      Subscriber: pt   Emergency Contact Information Contact Information    Name Relation Home Work Mobile   [place] [phone]  [phone]     Current Medical History  Patient Admitting Diagnosis: right BKA, diabetic peripheral neuropathy  History of Present Illness: 66 y.o. right-handed female with history of diabetes mellitus with peripheral neuropathy, coronary artery disease with PTCA, peripheral vascular disease with multiple revascularization procedures. Patient recently discharged 04/09/2012 after aortobifemoral bypass as well as thrombectomy of right femoral to below knee popliteal Gore-Tex graft extension and umbilical hernia repair 03/26/2012 per Dr. Arbie Cookey with hospital course complicated by respiratory failure which required intubation as well as flash pulmonary edema with elevated troponin and NSTEMI with cardiac catheterization completed showing patency of left circumflex stent. Patient admitted 04/23/2012 and presented with progressive ischemic changes of the right foot as well as increased rest pain. 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 Arbie Cookey. Postoperative pain management. Placed on subcutaneous Lovenox for DVT  prophylaxis.       Past Medical History  Past Medical History  Diagnosis Date  . Hypertension     Unspecified  . Hyperlipidemia     Mixed  . Tobacco abuse     Remote  . Loose, teeth     has loose bridge and two loose teeth holding it  . PONV (postoperative nausea and vomiting)   . Type II diabetes mellitus   . GERD (gastroesophageal reflux disease)   . Anxiety   . Peripheral vascular disease     unspecified, a. s/p L CEA b. s/p B fem-pop bypass  . Raynaud disease   . Stones in the urinary tract   . UTI (urinary tract infection)   . Anemia     hx  . Coronary artery disease     a. s/p NSTEMI 02/2009 - PCI LCX with Xience DES. Otherwise branch vessel and Dist. RCA dzs. NL EF.   Marland Kitchen Myocardial infarction   . CHF (congestive heart failure)   . Kidney stones   . UTI (lower urinary tract infection)   . Constipation   . Hernia, umbilical   . History of blood transfusion     Family History  family history includes Diabetes in her brother; Heart attack in her father and mother; and Hypertension in her brothers.  Prior Rehab/Hospitalizations: no previous rehab   Current Medications  Current facility-administered medications:ALPRAZolam (XANAX) tablet 0.25 mg, 0.25 mg, Oral, TID PRN, Marlowe Shores, PA, 0.25  mg at 04/25/12 1004;  aspirin EC tablet 325 mg, 325 mg, Oral, q morning - 10a, Amelia Jo [place], [place], 325 mg at 04/25/12 1146;  bisacodyl (DULCOLAX) suppository 10 mg, 10 mg, Rectal, Daily PRN, Marlowe Shores, PA calcium carbonate (TUMS - dosed in mg elemental calcium) chewable tablet 200-800 mg of elemental calcium, 1-4 tablet, Oral, Daily PRN, Marlowe Shores, PA;  clopidogrel (PLAVIX) tablet 75 mg, 75 mg, Oral, Daily, Regina J [place], [place], 75 mg at 04/25/12 1146;  dexlansoprazole (DEXILANT) capsule 60 mg, 60 mg, Oral, BID, Amelia Jo [place], [place], 60 mg at 04/25/12 2152 docusate sodium (COLACE) capsule 100 mg, 100 mg, Oral, Daily, Amelia Jo Roczniak, PA, 100 mg at 04/24/12  1011;  enoxaparin (LOVENOX) injection 40 mg, 40 mg, Subcutaneous, Q24H, Regina J [place], [place], 40 mg at 04/25/12 1649;  feeding supplement (GLUCERNA SHAKE) liquid 237 mL, 237 mL, Oral, Q1500, Ailene Ards, RD;  furosemide (LASIX) tablet 40 mg, 40 mg, Oral, Daily, Regina J [place], [place], 40 mg at 04/25/12 1146 glyBURIDE (DIABETA) tablet 2.5 mg, 2.5 mg, Oral, q morning - 10a, Regina J [place], [place], 2.5 mg at 04/24/12 1012;  hydrALAZINE (APRESOLINE) injection 10 mg, 10 mg, Intravenous, Q2H PRN, Marlowe Shores, PA, 10 mg at 04/25/12 2352;  insulin aspart (novoLOG) injection 0-15 Units, 0-15 Units, Subcutaneous, TID WC, Marlowe Shores, [place], 2 Units at 04/26/12 1610 iron polysaccharides (NIFEREX) capsule 150 mg, 150 mg, Oral, BID, Amelia Jo [place], [place], 150 mg at 04/25/12 1146;  isosorbide mononitrate (IMDUR) 24 hr tablet 60 mg, 60 mg, Oral, Daily, Regina J [place], [place], 60 mg at 04/25/12 1146;  labetalol (NORMODYNE,TRANDATE) injection 10 mg, 10 mg, Intravenous, Q2H PRN, Marlowe Shores, PA, 20 mg at 04/26/12 0527 linagliptin (TRADJENTA) tablet 5 mg, 5 mg, Oral, QHS, Regina J [place], [place], 5 mg at 04/24/12 2132;  meperidine (DEMEROL) tablet 50 mg, 50 mg, Oral, Q4H PRN, Marlowe Shores, PA, 50 mg at 04/26/12 0939;  metoprolol (LOPRESSOR) injection 2-5 mg, 2-5 mg, Intravenous, Q2H PRN, Marlowe Shores, PA;  metoprolol (LOPRESSOR) tablet 50 mg, 50 mg, Oral, BID, Regina J [place], [place], 50 mg at 04/25/12 2149 morphine 2 MG/ML injection 2 mg, 2 mg, Intravenous, Q2H PRN, Marlowe Shores, PA;  mupirocin ointment (BACTROBAN) 2 %, , Nasal, BID, Larina Earthly, MD;  nitroGLYCERIN (NITROSTAT) SL tablet 0.4 mg, 0.4 mg, Sublingual, Q5 min PRN, Amelia Jo Roczniak, PA;  phenol (CHLORASEPTIC) mouth spray 1 spray, 1 spray, Mouth/Throat, PRN, Marlowe Shores, PA pioglitazone-metformin (ACTOPLUS MET) 15-500 MG per tablet 1 tablet, 1 tablet, Oral, BID WC, Larina Earthly, MD, 1 tablet at 04/26/12 0817;  polyethylene  glycol (MIRALAX / GLYCOLAX) packet 17 g, 17 g, Oral, Daily PRN, Amelia Jo Roczniak, PA;  potassium chloride (K-DUR) CR tablet 10 mEq, 10 mEq, Oral, BID, Amelia Jo [place], [place], 10 mEq at 04/25/12 2149 prochlorperazine (COMPAZINE) tablet 10 mg, 10 mg, Oral, Q6H PRN, Amelia Jo Roczniak, PA, 10 mg at 04/26/12 0531;  ramipril (ALTACE) capsule 2.5 mg, 2.5 mg, Oral, BID, Kristen Loader Early, MD, 2.5 mg at 04/25/12 2148;  sodium chloride 0.9 % injection 10-40 mL, 10-40 mL, Intracatheter, Q12H, Kristen Loader Early, MD;  sodium chloride 0.9 % injection 10-40 mL, 10-40 mL, Intracatheter, PRN, Larina Earthly, MD, 10 mL at 04/24/12 2215 Vitamin D (Ergocalciferol) (DRISDOL) capsule 50,000 Units, 50,000 Units, Oral, Q7 days, Larina Earthly, MD  Patients Current Diet: Carb Control  Precautions / Restrictions Precautions Precautions: Fall  Restrictions Weight Bearing Restrictions: No   Prior Activity Level Household: Mod I w/ RW betw/ recent hosp d/c  & readmit 04/23/12 for BKA    Home Assistive Devices / Equipment Home Assistive Devices/Equipment: Eyeglasses;Dentures (specify type) Home Adaptive Equipment: Walker - rolling;Built-in shower seat  Prior Functional Level Prior Function Level of Independence: Independent with assistive device(s) Able to Take Stairs?: Yes Driving: No Comments: Since D/C from hospital pt ambulating with RW  Current Functional Level Cognition  Arousal/Alertness: Awake/alert Overall Cognitive Status: Appears within functional limits for tasks assessed/performed Current Attention Level: Selective Attention - Other Comments: Pt self distracts Orientation Level: Oriented X4 Cognition - Other Comments: Pt self limiting with activity.    Extremity Assessment (includes Sensation/Coordination)  RUE ROM/Strength/Tone: WFL for tasks assessed RUE Coordination: WFL - gross/fine motor  RLE ROM/Strength/Tone: Deficits;Due to pain RLE ROM/Strength/Tone Deficits: Able to move hip against gravity with  assist.  Poor quad set currently.  -15 degrees from full knee extension.  Pt/daughter educated on placement of pillow under lower leg and not under knee to promote knee extension.    ADLs  Eating/Feeding: Independent Where Assessed - Eating/Feeding: Bed level Grooming: Simulated;Minimal assistance Where Assessed - Grooming: Unsupported sitting Upper Body Bathing: Simulated;Minimal assistance Where Assessed - Upper Body Bathing: Unsupported sitting Lower Body Bathing: Maximal assistance Where Assessed - Lower Body Bathing: Supported sit to stand Upper Body Dressing: Simulated;Minimal assistance Where Assessed - Upper Body Dressing: Unsupported sitting Lower Body Dressing: Simulated;+1 Total assistance Where Assessed - Lower Body Dressing: Supported sit to stand Toilet Transfer: Simulated;+2 Total assistance Toilet Transfer: Patient Percentage: 80% Statistician Method: Sit to Barista: Other (comment) (with steady lift) Toileting - Clothing Manipulation and Hygiene: Simulated;+1 Total assistance Where Assessed - Glass blower/designer Manipulation and Hygiene: Standing Equipment Used: Other (comment) (steady lift) ADL Comments: Pt. very anxious.  Would not engage fully in ADL activity    Mobility  Bed Mobility: Rolling Left Rolling Left: 5: Supervision;With rail Supine to Sit: 4: Min guard;HOB elevated;With rails Sitting - Scoot to Edge of Bed: 4: Min guard Sit to Supine: 5: Supervision;HOB flat Scooting to HOB: 1: +2 Total assist Scooting to [facility]: Patient Percentage: 30%    Transfers  Sit to Stand: 4: Min assist;1: +2 Total assist;From bed;With armrests;With upper extremity assist;From chair/3-in-1 Sit to Stand: Patient Percentage: 80% Stand to Sit: 4: Min assist;With upper extremity assist;With armrests;To chair/3-in-1 Stand to Sit: Patient Percentage: 80% Stand Pivot Transfers: 4: Min assist Transfer via Lift Equipment: Stedy    Ambulation / Gait /  Stairs / Psychologist, prison and probation services  Ambulation/Gait Ambulation/Gait Assistance: 1: +2 Total assist Ambulation/Gait: Patient Percentage: 80% Ambulation Distance (Feet): 15 Feet Assistive device: Rolling walker Ambulation/Gait Assistance Details: Verbal cues to stay more upright. Gait Pattern: Trunk flexed;Step-to pattern    Posture / Balance Static Sitting Balance Static Sitting - Balance Support: Bilateral upper extremity supported Static Sitting - Level of Assistance: 5: Stand by assistance Static Sitting - Comment/# of Minutes: sat EOB x 5 minutes Static Standing Balance Static Standing - Balance Support: Bilateral upper extremity supported (on walker) Static Standing - Level of Assistance: 4: Min assist Static Standing - Comment/# of Minutes: stood x 15-20 sec x 2 with [employer]     Previous Home Environment Living Arrangements: Spouse/significant other;Children;Other relatives;Non-relatives/Friends Lives With: Spouse Available Help at Discharge: Family Type of Home: House Home Layout: One level Home Access: Stairs to enter Entrance Stairs-Rails: Right Entrance Stairs-Number of Steps: 3 Bathroom Shower/Tub: Walk-in shower;Door [employer]  Toilet: Standard Bathroom Accessibility: Yes How Accessible: Accessible via walker Home Care Services: No  Discharge Living Setting Plans for Discharge Living Setting: Patient's home Do you have any problems obtaining your medications?: No  Social/Family/Support Systems Patient Roles: Spouse;Parent Contact Information: [phone]  Anticipated Caregiver: Husband + occ. Daughter/Son Anticipated Caregiver's Contact Information: husband's cell [phone] Ability/Limitations of Caregiver: S-Min Assist Caregiver Availability: 24/7 Discharge Plan Discussed with Primary Caregiver: Yes Is Caregiver In Agreement with Plan?: Yes Does Caregiver/Family have Issues with Lodging/Transportation while Pt is in Rehab?: No  Goals/Additional  Needs Patient/Family Goal for Rehab: Mod I-S Expected length of stay: 10-14 days Pt/Family Agrees to Admission and willing to participate: Yes Program Orientation Provided & Reviewed with Pt/Caregiver Including Roles  & Responsibilities: Yes  Patient Condition: Please see physician update to information in consult dated 04/24/12..  Preadmission Screen Completed By:  Clois Dupes, 04/26/2012 11:10 AM ______________________________________________________________________   Discussed status with Dr. Wynn Banker on 04/26/12 at  1109 and received telephone approval for admission today.  Admission Coordinator:  Clois Dupes, time 2956 Date 04/26/12.

## 2012-04-26 ENCOUNTER — Inpatient Hospital Stay (HOSPITAL_COMMUNITY)
Admission: RE | Admit: 2012-04-26 | Discharge: 2012-05-08 | DRG: 945 | Disposition: A | Discharge status: Home Health | Payer: Medicare Other | Source: Ambulatory Visit | Attending: Physical Medicine & Rehabilitation | Admitting: Physical Medicine & Rehabilitation

## 2012-04-26 ENCOUNTER — Telehealth: Payer: Self-pay | Admitting: Vascular Surgery

## 2012-04-26 DIAGNOSIS — E782 Mixed hyperlipidemia: Secondary | ICD-10-CM

## 2012-04-26 DIAGNOSIS — E876 Hypokalemia: Secondary | ICD-10-CM

## 2012-04-26 DIAGNOSIS — F411 Generalized anxiety disorder: Secondary | ICD-10-CM

## 2012-04-26 DIAGNOSIS — E1149 Type 2 diabetes mellitus with other diabetic neurological complication: Secondary | ICD-10-CM

## 2012-04-26 DIAGNOSIS — I739 Peripheral vascular disease, unspecified: Secondary | ICD-10-CM

## 2012-04-26 DIAGNOSIS — I509 Heart failure, unspecified: Secondary | ICD-10-CM

## 2012-04-26 DIAGNOSIS — Z89519 Acquired absence of unspecified leg below knee: Secondary | ICD-10-CM

## 2012-04-26 DIAGNOSIS — E1159 Type 2 diabetes mellitus with other circulatory complications: Secondary | ICD-10-CM

## 2012-04-26 DIAGNOSIS — I5032 Chronic diastolic (congestive) heart failure: Secondary | ICD-10-CM

## 2012-04-26 DIAGNOSIS — S88119A Complete traumatic amputation at level between knee and ankle, unspecified lower leg, initial encounter: Secondary | ICD-10-CM

## 2012-04-26 DIAGNOSIS — Z79899 Other long term (current) drug therapy: Secondary | ICD-10-CM

## 2012-04-26 DIAGNOSIS — Z886 Allergy status to analgesic agent status: Secondary | ICD-10-CM

## 2012-04-26 DIAGNOSIS — D62 Acute posthemorrhagic anemia: Secondary | ICD-10-CM

## 2012-04-26 DIAGNOSIS — I2510 Atherosclerotic heart disease of native coronary artery without angina pectoris: Secondary | ICD-10-CM

## 2012-04-26 DIAGNOSIS — Z5189 Encounter for other specified aftercare: Principal | ICD-10-CM

## 2012-04-26 DIAGNOSIS — S78119A Complete traumatic amputation at level between unspecified hip and knee, initial encounter: Secondary | ICD-10-CM

## 2012-04-26 DIAGNOSIS — Z8249 Family history of ischemic heart disease and other diseases of the circulatory system: Secondary | ICD-10-CM

## 2012-04-26 DIAGNOSIS — Z87891 Personal history of nicotine dependence: Secondary | ICD-10-CM

## 2012-04-26 DIAGNOSIS — Z9861 Coronary angioplasty status: Secondary | ICD-10-CM

## 2012-04-26 DIAGNOSIS — E1142 Type 2 diabetes mellitus with diabetic polyneuropathy: Secondary | ICD-10-CM

## 2012-04-26 DIAGNOSIS — K5900 Constipation, unspecified: Secondary | ICD-10-CM

## 2012-04-26 DIAGNOSIS — I70209 Unspecified atherosclerosis of native arteries of extremities, unspecified extremity: Secondary | ICD-10-CM

## 2012-04-26 DIAGNOSIS — I7300 Raynaud's syndrome without gangrene: Secondary | ICD-10-CM

## 2012-04-26 DIAGNOSIS — Z91012 Allergy to eggs: Secondary | ICD-10-CM

## 2012-04-26 DIAGNOSIS — Z7902 Long term (current) use of antithrombotics/antiplatelets: Secondary | ICD-10-CM

## 2012-04-26 DIAGNOSIS — Z881 Allergy status to other antibiotic agents status: Secondary | ICD-10-CM

## 2012-04-26 DIAGNOSIS — Z888 Allergy status to other drugs, medicaments and biological substances status: Secondary | ICD-10-CM

## 2012-04-26 DIAGNOSIS — I252 Old myocardial infarction: Secondary | ICD-10-CM

## 2012-04-26 DIAGNOSIS — L98499 Non-pressure chronic ulcer of skin of other sites with unspecified severity: Secondary | ICD-10-CM

## 2012-04-26 DIAGNOSIS — Z7982 Long term (current) use of aspirin: Secondary | ICD-10-CM

## 2012-04-26 DIAGNOSIS — K219 Gastro-esophageal reflux disease without esophagitis: Secondary | ICD-10-CM

## 2012-04-26 DIAGNOSIS — I214 Non-ST elevation (NSTEMI) myocardial infarction: Secondary | ICD-10-CM

## 2012-04-26 DIAGNOSIS — I10 Essential (primary) hypertension: Secondary | ICD-10-CM

## 2012-04-26 DIAGNOSIS — Z833 Family history of diabetes mellitus: Secondary | ICD-10-CM

## 2012-04-26 LAB — CBC
HCT: 24.6 % — ABNORMAL LOW (ref 36.0–46.0)
MCH: 26.6 pg (ref 26.0–34.0)
MCHC: 32.5 g/dL (ref 30.0–36.0)
MCV: 81.7 fL (ref 78.0–100.0)
Platelets: 370 10*3/uL (ref 150–400)
RDW: 16.5 % — ABNORMAL HIGH (ref 11.5–15.5)

## 2012-04-26 LAB — GLUCOSE, CAPILLARY: Glucose-Capillary: 150 mg/dL — ABNORMAL HIGH (ref 70–99)

## 2012-04-26 MED ORDER — PROCHLORPERAZINE MALEATE 10 MG PO TABS
10.0000 mg | ORAL_TABLET | Freq: Four times a day (QID) | ORAL | Status: DC | PRN
  Administered 2012-04-26 – 2012-04-28 (×4): 10 mg via ORAL
  Administered 2012-04-28: 5 mg via ORAL
  Administered 2012-04-29 – 2012-05-08 (×22): 10 mg via ORAL
  Filled 2012-04-26 (×18): qty 1

## 2012-04-26 MED ORDER — ISOSORBIDE MONONITRATE ER 60 MG PO TB24
60.0000 mg | ORAL_TABLET | Freq: Every day | ORAL | Status: DC
  Administered 2012-04-27 – 2012-05-08 (×12): 60 mg via ORAL
  Filled 2012-04-26 (×14): qty 1

## 2012-04-26 MED ORDER — SODIUM CHLORIDE 0.9 % IJ SOLN
10.0000 mL | Freq: Two times a day (BID) | INTRAMUSCULAR | Status: DC
  Administered 2012-04-26 – 2012-05-05 (×3): 20 mL

## 2012-04-26 MED ORDER — SODIUM CHLORIDE 0.9 % IJ SOLN
10.0000 mL | INTRAMUSCULAR | Status: DC | PRN
  Administered 2012-04-27: 20 mL
  Administered 2012-04-28: 10 mL
  Administered 2012-04-28: 20 mL
  Administered 2012-04-28: 10 mL
  Administered 2012-05-01 – 2012-05-02 (×2): 20 mL
  Administered 2012-05-03: 10 mL
  Administered 2012-05-04 – 2012-05-08 (×6): 20 mL

## 2012-04-26 MED ORDER — SORBITOL 70 % SOLN: 30.0000 mL | Freq: Every day | Status: DC | PRN

## 2012-04-26 MED ORDER — POLYETHYLENE GLYCOL 3350 17 G PO PACK
17.0000 g | PACK | Freq: Every day | ORAL | Status: DC | PRN
  Filled 2012-04-26: qty 1

## 2012-04-26 MED ORDER — FUROSEMIDE 40 MG PO TABS
40.0000 mg | ORAL_TABLET | Freq: Every day | ORAL | Status: DC
  Administered 2012-04-27 – 2012-05-03 (×7): 40 mg via ORAL
  Filled 2012-04-26 (×10): qty 1

## 2012-04-26 MED ORDER — VITAMIN D (ERGOCALCIFEROL) 1.25 MG (50000 UNIT) PO CAPS
50000.0000 [IU] | ORAL_CAPSULE | ORAL | Status: DC
  Administered 2012-05-03: 50000 [IU] via ORAL
  Filled 2012-04-26: qty 1

## 2012-04-26 MED ORDER — ALPRAZOLAM 0.25 MG PO TABS
0.2500 mg | ORAL_TABLET | Freq: Three times a day (TID) | ORAL | Status: DC | PRN
  Administered 2012-04-27 – 2012-05-08 (×19): 0.25 mg via ORAL
  Filled 2012-04-26 (×20): qty 1

## 2012-04-26 MED ORDER — PIOGLITAZONE HCL-METFORMIN HCL 15-500 MG PO TABS: 1.0000 | ORAL_TABLET | Freq: Two times a day (BID) | ORAL | Status: DC

## 2012-04-26 MED ORDER — GLUCERNA SHAKE PO LIQD
237.0000 mL | Freq: Every day | ORAL | Status: DC
  Administered 2012-04-28: 237 mL via ORAL

## 2012-04-26 MED ORDER — BISACODYL 10 MG RE SUPP
10.0000 mg | Freq: Every day | RECTAL | Status: DC | PRN
  Administered 2012-04-28: 10 mg via RECTAL
  Filled 2012-04-26 (×2): qty 1

## 2012-04-26 MED ORDER — MUPIROCIN 2 % EX OINT
TOPICAL_OINTMENT | Freq: Two times a day (BID) | CUTANEOUS | Status: AC
  Administered 2012-04-27: 09:00:00 via NASAL
  Filled 2012-04-26: qty 22

## 2012-04-26 MED ORDER — POTASSIUM CHLORIDE ER 10 MEQ PO TBCR
10.0000 meq | EXTENDED_RELEASE_TABLET | Freq: Two times a day (BID) | ORAL | Status: DC
  Administered 2012-04-26: 10 meq via ORAL
  Filled 2012-04-26 (×4): qty 1

## 2012-04-26 MED ORDER — NITROGLYCERIN 0.4 MG SL SUBL: 0.4000 mg | SUBLINGUAL_TABLET | SUBLINGUAL | Status: DC | PRN

## 2012-04-26 MED ORDER — ENOXAPARIN SODIUM 30 MG/0.3ML ~~LOC~~ SOLN: 30.0000 mg | SUBCUTANEOUS | Status: DC

## 2012-04-26 MED ORDER — CLOPIDOGREL BISULFATE 75 MG PO TABS
75.0000 mg | ORAL_TABLET | Freq: Every day | ORAL | Status: DC
  Administered 2012-04-27 – 2012-05-08 (×12): 75 mg via ORAL
  Filled 2012-04-26 (×16): qty 1

## 2012-04-26 MED ORDER — POLYSACCHARIDE IRON COMPLEX 150 MG PO CAPS
150.0000 mg | ORAL_CAPSULE | Freq: Two times a day (BID) | ORAL | Status: DC
  Administered 2012-04-26 – 2012-05-08 (×24): 150 mg via ORAL
  Filled 2012-04-26 (×26): qty 1

## 2012-04-26 MED ORDER — ACETAMINOPHEN 325 MG PO TABS: 325.0000 mg | ORAL_TABLET | ORAL | Status: DC | PRN

## 2012-04-26 MED ORDER — INSULIN ASPART 100 UNIT/ML ~~LOC~~ SOLN
0.0000 [IU] | Freq: Three times a day (TID) | SUBCUTANEOUS | Status: DC
  Administered 2012-04-27: 5 [IU] via SUBCUTANEOUS
  Administered 2012-04-27: 3 [IU] via SUBCUTANEOUS
  Administered 2012-04-28 – 2012-04-29 (×2): 5 [IU] via SUBCUTANEOUS
  Administered 2012-04-30 (×2): 3 [IU] via SUBCUTANEOUS
  Administered 2012-04-30: 5 [IU] via SUBCUTANEOUS
  Administered 2012-05-01 (×2): 2 [IU] via SUBCUTANEOUS
  Administered 2012-05-01: 5 [IU] via SUBCUTANEOUS
  Administered 2012-05-02: 2 [IU] via SUBCUTANEOUS
  Administered 2012-05-02: 3 [IU] via SUBCUTANEOUS
  Administered 2012-05-02 – 2012-05-03 (×3): 2 [IU] via SUBCUTANEOUS
  Administered 2012-05-03: 5 [IU] via SUBCUTANEOUS
  Administered 2012-05-04 – 2012-05-05 (×4): 3 [IU] via SUBCUTANEOUS
  Administered 2012-05-06: 8 [IU] via SUBCUTANEOUS
  Administered 2012-05-07 (×2): 3 [IU] via SUBCUTANEOUS

## 2012-04-26 MED ORDER — LINAGLIPTIN 5 MG PO TABS
5.0000 mg | ORAL_TABLET | Freq: Every day | ORAL | Status: DC
  Filled 2012-04-26 (×13): qty 1

## 2012-04-26 MED ORDER — DEXLANSOPRAZOLE 60 MG PO CPDR
60.0000 mg | DELAYED_RELEASE_CAPSULE | Freq: Two times a day (BID) | ORAL | Status: DC
  Administered 2012-04-26 – 2012-05-08 (×24): 60 mg via ORAL
  Filled 2012-04-26 (×5): qty 1

## 2012-04-26 MED ORDER — PIOGLITAZONE HCL 15 MG PO TABS
15.0000 mg | ORAL_TABLET | Freq: Two times a day (BID) | ORAL | Status: DC
  Filled 2012-04-26 (×2): qty 1

## 2012-04-26 MED ORDER — METOPROLOL TARTRATE 50 MG PO TABS
50.0000 mg | ORAL_TABLET | Freq: Two times a day (BID) | ORAL | Status: DC
  Administered 2012-04-26 – 2012-05-08 (×24): 50 mg via ORAL
  Filled 2012-04-26 (×27): qty 1

## 2012-04-26 MED ORDER — GLYBURIDE 2.5 MG PO TABS
2.5000 mg | ORAL_TABLET | Freq: Every day | ORAL | Status: DC
  Administered 2012-04-27: 2.5 mg via ORAL
  Filled 2012-04-26 (×3): qty 1

## 2012-04-26 MED ORDER — ENOXAPARIN SODIUM 40 MG/0.4ML ~~LOC~~ SOLN
40.0000 mg | SUBCUTANEOUS | Status: DC
  Administered 2012-04-26 – 2012-05-02 (×7): 40 mg via SUBCUTANEOUS
  Filled 2012-04-26 (×12): qty 0.4

## 2012-04-26 MED ORDER — DEXLANSOPRAZOLE 60 MG PO CPDR: 60.0000 mg | DELAYED_RELEASE_CAPSULE | Freq: Two times a day (BID) | ORAL | Status: DC

## 2012-04-26 MED ORDER — METFORMIN HCL 500 MG PO TABS
500.0000 mg | ORAL_TABLET | Freq: Two times a day (BID) | ORAL | Status: DC
  Filled 2012-04-26 (×2): qty 1

## 2012-04-26 MED ORDER — PANTOPRAZOLE SODIUM 40 MG PO TBEC: 40.0000 mg | DELAYED_RELEASE_TABLET | Freq: Every day | ORAL | Status: DC

## 2012-04-26 MED ORDER — CALCIUM CARBONATE ANTACID 500 MG PO CHEW
1.0000 | CHEWABLE_TABLET | Freq: Every day | ORAL | Status: DC | PRN
  Filled 2012-04-26: qty 4

## 2012-04-26 MED ORDER — PHENOL 1.4 % MT LIQD
1.0000 | OROMUCOSAL | Status: DC | PRN
  Filled 2012-04-26: qty 177

## 2012-04-26 MED ORDER — PIOGLITAZONE HCL-METFORMIN HCL 15-500 MG PO TABS
1.0000 | ORAL_TABLET | Freq: Two times a day (BID) | ORAL | Status: DC
  Administered 2012-04-26 – 2012-04-28 (×4): 1 via ORAL
  Filled 2012-04-26: qty 1

## 2012-04-26 MED ORDER — MEPERIDINE HCL 50 MG PO TABS
50.0000 mg | ORAL_TABLET | ORAL | Status: DC | PRN
  Administered 2012-04-26 – 2012-05-08 (×39): 50 mg via ORAL
  Filled 2012-04-26 (×39): qty 1

## 2012-04-26 MED ORDER — RAMIPRIL 2.5 MG PO CAPS
2.5000 mg | ORAL_CAPSULE | Freq: Two times a day (BID) | ORAL | Status: DC
  Administered 2012-04-26 – 2012-04-28 (×4): 2.5 mg via ORAL
  Filled 2012-04-26 (×6): qty 1

## 2012-04-26 MED ORDER — ASPIRIN EC 325 MG PO TBEC
325.0000 mg | DELAYED_RELEASE_TABLET | Freq: Every morning | ORAL | Status: DC
  Administered 2012-04-27 – 2012-05-08 (×12): 325 mg via ORAL
  Filled 2012-04-26 (×14): qty 1

## 2012-04-26 NOTE — Progress Notes (Signed)
Subjective: Interval History: none.. had nausea yesterday which she can do readjusting her home meds. Feels very good this morning and nausea has resolved. She is ready to begin rehabilitation.  Objective: Vital signs in last 24 hours: Temp:  [98.5 F (36.9 C)-98.9 F (37.2 C)] 98.5 F (36.9 C) (10/24 0433) Pulse Rate:  [90-107] 90  (10/24 0433) Resp:  [18] 18  (10/24 0433) BP: (172-187)/(54-68) 172/54 mmHg (10/24 0433) SpO2:  [96 %-98 %] 98 % (10/24 0433)  Intake/Output from previous day: 10/23 0701 - 10/24 0700 In: 720 [P.O.:720] Out: -  Intake/Output this shift: Total I/O In: 240 [P.O.:240] Out: -   BKA dressing intact. Some old bloody drainage on the lateral aspect. Will recheck wound in a.m.  Lab Results:  Basename 04/25/12 0500 04/24/12 0500  WBC 12.5* 7.7  HGB 9.0* 8.0*  HCT 27.5* 25.5*  PLT 385 333   BMET  Basename 04/25/12 0500 04/24/12 0500  NA 135 136  K 3.2* 3.9  CL 93* 97  CO2 28 27  GLUCOSE 178* 134*  BUN 3* 5*  CREATININE 0.37* 0.46*  CALCIUM 8.7 8.5    Studies/Results: Dg Chest 2 View  04/08/2012  *RADIOLOGY REPORT*  Clinical Data: Congestive heart failure.  CHEST - 2 VIEW  Comparison: 04/05/2012 and 03/31/2012.  Findings: Right arm PICC is unchanged at the lower SVC level.  The heart size and mediastinal contours are stable.  Pulmonary edema has nearly completely resolved.  There are small left greater than right pleural effusions with associated mild bibasilar atelectasis. Calcifications overlap the left humeral head, suspicious for hydroxyapatite deposition.  IMPRESSION:  1.  Near complete resolution of pulmonary edema. 2.  Left greater than right pleural effusions with associated bibasilar atelectasis. 3.  Question left shoulder calcific tendonitis/bursitis.   Original Report Authenticated By: Gerrianne Scale, M.D.    Ir Fluoro Guide Cv Line Left  04/23/2012  * RADIOLOGY REPORT *  Clinical data: Peripheral vascular disease, pre amputation.  Peripheral access needed.  PICC PLACEMENT WITH ULTRASOUND AND FLUOROSCOPY  Technique: After written informed consent was obtained, patient was placed in the supine position on angiographic table. Patency of the left brachial vein was confirmed with ultrasound with image documentation. An appropriate skin site was determined. Skin site was marked. Region was prepped using maximum barrier technique including cap and mask, sterile gown, sterile gloves, large sterile sheet, and Chlorhexidine   as cutaneous antisepsis.  The region was infiltrated locally with 1% lidocaine.   Under real-time ultrasound guidance, the left brachial vein was accessed with a 21 gauge micropuncture needle; the needle tip within the vein was confirmed with ultrasound image documentation.   Needle exchanged over a 018 guidewire for a peel-away sheath, through which a 5-French double- lumen power injectable PICC trimmed to 43cm was advanced, positioned with its tip near the cavoatrial junction.  Spot chest radiograph confirms appropriate catheter position.  Catheter was flushed per protocol and secured externally with 0-Prolene sutures. The patient tolerated procedure well, with no immediate complication.  IMPRESSION: Technically successful five [place] double lumen power injectable PICC placement   Original Report Authenticated By: Osa Craver, M.D.    Ir US Guide Vasc Access Left  04/23/2012  * RADIOLOGY REPORT *  Clinical data: Peripheral vascular disease, pre amputation. Peripheral access needed.  PICC PLACEMENT WITH ULTRASOUND AND FLUOROSCOPY  Technique: After written informed consent was obtained, patient was placed in the supine position on angiographic table. Patency of the left brachial vein was  confirmed with ultrasound with image documentation. An appropriate skin site was determined. Skin site was marked. Region was prepped using maximum barrier technique including cap and mask, sterile gown, sterile gloves, large sterile  sheet, and Chlorhexidine   as cutaneous antisepsis.  The region was infiltrated locally with 1% lidocaine.   Under real-time ultrasound guidance, the left brachial vein was accessed with a 21 gauge micropuncture needle; the needle tip within the vein was confirmed with ultrasound image documentation.   Needle exchanged over a 018 guidewire for a peel-away sheath, through which a 5-French double- lumen power injectable PICC trimmed to 43cm was advanced, positioned with its tip near the cavoatrial junction.  Spot chest radiograph confirms appropriate catheter position.  Catheter was flushed per protocol and secured externally with 0-Prolene sutures. The patient tolerated procedure well, with no immediate complication.  IMPRESSION: Technically successful five [place] double lumen power injectable PICC placement   Original Report Authenticated By: Osa Craver, M.D.    Dg Chest Port 1 View  04/05/2012  *RADIOLOGY REPORT*  Clinical Data: Shortness of breath with low O2 sats.  PORTABLE CHEST - 1 VIEW  Comparison: 03/31/2012  Findings: 2304 hours. The cardiopericardial silhouette is enlarged. Interval development of patchy asymmetric airspace disease in the left upper lobe and both bases. The cardiopericardial silhouette is enlarged.  Vascular congestion persist.  Right PICC line tip is noted overlying the mid SVC level. Telemetry leads overlie the chest.  IMPRESSION: Cardiomegaly with interstitial and patchy areas of asymmetric airspace disease.  Features suggest evolving asymmetric pulmonary edema.  Diffuse infection is also a possibility.   Original Report Authenticated By: ERIC A. MANSELL, M.D.    Dg Chest Port 1 View  03/31/2012  *RADIOLOGY REPORT*  Clinical Data: Pulmonary edema.  PORTABLE CHEST - 1 VIEW  Comparison: Chest x-ray 03/31/2012.  Findings: Right IJ Cordis in place with tip in the distal internal jugular vein.  Lung volumes are normal. There is cephalization of the pulmonary vasculature and  slight indistinctness of the interstitial markings suggestive of mild pulmonary edema.  No consolidative airspace disease.  No pleural effusions.  Heart size is borderline to mildly enlarged.  Atherosclerosis in the thoracic aorta.  IMPRESSION: 1.  Borderline to mildly enlarged cardiac silhouette with mild interstitial pulmonary edema. 2.  Atherosclerosis.   Original Report Authenticated By: Florencia Reasons, M.D.    Dg Chest Port 1 View  03/31/2012  *RADIOLOGY REPORT*  Clinical Data: Difficulty breathing.  PORTABLE CHEST - 1 VIEW  Comparison: 03/30/2012  Findings: Endotracheal tube and nasogastric tube have been removed. Again noted is a right jugular central line with the tip in the right jugular vein region.  There are increased interstitial densities and increased basilar densities.  Findings are concerning for interstitial edema and pleural effusions.  Heart size is stable.  IMPRESSION: Increasing interstitial pulmonary edema.  Increased basilar densities are suggestive for atelectasis and pleural fluid.   Original Report Authenticated By: Richarda Overlie, M.D.    Dg Chest Port 1 View  03/30/2012  *RADIOLOGY REPORT*  Clinical Data: Evaluate endotracheal tube position  PORTABLE CHEST - 1 VIEW  Comparison: 03/29/2012.  Findings: A right internal jugular introducer sheath is in place. An orogastric tube is in place with the tip located projecting over the region of the gastric fundus.  The side port is positioned overlying the distal esophagus and this could be advanced up to 5 cm to ensure intragastric positioning of the side port if desired. The endotracheal tube  tip is located in the proximal trachea, 6.5 cm above the level of the carina.  Heart and mediastinal contours are unchanged with mild ectasia of the thoracic aorta with intraaortic calcification.  Pulmonary vascular congestion with interstitial prominence, small left pleural effusion and interstitial septal lines is again most suspicious for  interstitial pulmonary edema.  More focal basilar density is seen today at the left lung base with relative clearing of the density seen previously at the right lung base and I suspect that this represents shifting basilar positional alveolar edema combined with atelectasis.  Bony structures remain intact.  IMPRESSION: Lines and tubes as above.  Findings most compatible with interstitial edema with shifting basilar edema pattern and probable left lower lobe volume loss.   Original Report Authenticated By: Bertha Stakes, M.D.    Dg Chest Port 1 View  03/29/2012  *RADIOLOGY REPORT*  Clinical Data: Pulmonary edema  PORTABLE CHEST - 1 VIEW  Comparison: 03/28/2012  Findings: Endotracheal tube tip projects 5.5 cm proximal to the carina.  NG tube descends below the level of the image.  Heart size upper normal to mildly enlarged.  Central vascular congestion. Interstitial and more confluent airspace opacities within the lung bases, mildly increased in the interval.  Right IJ sheath.  No pneumothorax.  Small effusion not excluded on the left.  No interval osseous change.  IMPRESSION: Interstitial and right greater than left airspace opacities, in keeping with pulmonary edema (favored) versus multifocal pneumonia. The airspace opacities have increased mildly in the interval.  Appropriately positioned support devices as above.   Original Report Authenticated By: Waneta Martins, M.D.    Portable Chest Xray  03/28/2012  *RADIOLOGY REPORT*  Clinical Data: 66 year old female intubated.  PORTABLE CHEST - 1 VIEW  Comparison: 0640 hours the same day and earlier.  Findings: Portable semi upright AP view 2338 hours.  And tracheal tube now in place, tip in good position between level of clavicles and carina.  Enteric tube now in position, courses to the left upper quadrant and tip not included.  Stable right IJ introducer sheath.   Stable cardiac size and mediastinal contours.  Increased confluent opacity at the right  lung base superimposed on diffuse increased interstitial markings.  No pneumothorax or large effusion.  IMPRESSION: 1.  Lines and tubes in good position as above. 2.  New confluent right infrahilar opacity could reflect aspiration or pneumonia.   Original Report Authenticated By: Harley Hallmark, M.D.    Dg Chest Port 1 View  03/28/2012  *RADIOLOGY REPORT*  Clinical Data: Postop.  Atelectasis.  PORTABLE CHEST - 1 VIEW  Comparison: 03/27/2012  Findings: Interval extubation and removal of Swan-Ganz catheter and NG tube.  Improving bibasilar aeration with decreasing atelectasis. Heart is borderline in size.  Diffuse interstitial prominence throughout the lungs, possible mild interstitial edema.  IMPRESSION: Improving bibasilar aeration following extubation.  Continued interstitial prominence may reflect interstitial edema.   Original Report Authenticated By: Cyndie Chime, M.D.    Dg Chest Port 1 View  03/27/2012  *RADIOLOGY REPORT*  Clinical Data: Status post aorto bifemoral bypass and umbilical hernia repair.  PORTABLE CHEST - 1 VIEW  Comparison: 03/26/2012 and 03/21/2012.  Findings: 0649 hours.  The endotracheal tube, nasogastric tube and Swan-Ganz catheter are unchanged.  There is increased left lower lobe atelectasis and a small left pleural effusion.  No pneumothorax or pulmonary edema is seen.  Heart size and mediastinal contours are stable.  Calcifications are noted adjacent to the left shoulder  suggesting hydroxyapatite deposition.  IMPRESSION:  1.  Increased left lower lobe atelectasis and small left pleural effusion. 2.  Otherwise stable postoperative chest with stable support system.   Original Report Authenticated By: Gerrianne Scale, M.D.    Anti-infectives: Anti-infectives     Start     Dose/Rate Route Frequency Ordered Stop   04/24/12 0000   cefUROXime (ZINACEF) 1.5 g in dextrose 5 % 50 mL IVPB        1.5 g 100 mL/hr over 30 Minutes Intravenous Every 12 hours 04/23/12 1522 04/24/12 1230     04/22/12 1434   cefUROXime (ZINACEF) 1.5 g in dextrose 5 % 50 mL IVPB        1.5 g 100 mL/hr over 30 Minutes Intravenous 60 min pre-op 04/22/12 1434 04/23/12 1330          Assessment/Plan: s/p Procedure(s) (LRB) with comments: AMPUTATION BELOW KNEE (Right) Stable postop day #3 from right below-knee amputation. Ready for transfer to inpatient rehabilitation. She is an extremely difficult IV access. Will need to continue her PICC line.   LOS: 3 days   EARLY, TODD 04/26/2012, 6:28 AM

## 2012-04-26 NOTE — Progress Notes (Signed)
Patient worked well with PT today. We will arrange admit to CIR/INpt rehab today. Patient and her spouse are in agreement. Please call [phone] with questions.

## 2012-04-26 NOTE — Progress Notes (Signed)
Overall Plan of Care [facility]) Patient Details Name: [name] MRN: [id number] DOB: [birth year]  Diagnosis:  Rehabilitation for BKA  Primary Diagnosis:    S/P BKA (below knee amputation) Co-morbidities: Diabetes and peripheral neuropathy,PVD,CAD,HTN  Functional Problem List  Patient demonstrates impairments in the following areas: Balance, Bladder, Bowel, Cognition, Edema, Endurance, Medication Management, Nutrition and Pain  Basic ADL's: grooming, bathing, dressing and toileting Advanced ADL's: simple meal preparation  Transfers:  bed mobility, bed to chair, toilet, tub/shower, car and floor Locomotion:  ambulation, wheelchair mobility and stairs  Additional Impairments:  None  Anticipated Outcomes Item Anticipated Outcome  Eating/Swallowing    Basic self-care  Mod I - Supervision overall  Tolieting  Supervision  Bowel/Bladder  Min assist   Transfers  Modified independent/supervision  Locomotion  Supervision   Communication    Cognition    Pain  Min assist  Safety/Judgment  Min assist  Other     Therapy Plan: PT Frequency: 2-3 X/day, 60-90 minutes OT Frequency: 1-2 X/day, 60-90 minutes;5 out of 7 days     Team Interventions: Item RN PT OT SLP SW TR Other  Self Care/Advanced ADL Retraining   x      Neuromuscular Re-Education  x       Therapeutic Activities  x x      UE/LE Strength Training/ROM  x x      UE/LE Coordination Activities         Visual/Perceptual Remediation/Compensation         DME/Adaptive Equipment Instruction  x x      Therapeutic Exercise  x x      Balance/Vestibular Training  x x      Patient/Family Education x x x      Cognitive Remediation/Compensation x        Functional Mobility Training  x x      Ambulation/Gait Training  x       Museum/gallery curator  x       Wheelchair Propulsion/Positioning  x       Functional Tourist information centre manager Reintegration  x x      Dysphagia/Aspiration Chemical engineer         Bladder Management         Bowel Management         Disease Management/Prevention x x x      Pain Management x x x      Medication Management x        Skin Care/Wound Management x x x      Splinting/Orthotics  x       Discharge Planning  x x      Psychosocial Support  x x                         Team Discharge Planning: Destination:  Home Projected Follow-up:  PT, Home Health and Outpatient (once receives prosthesis) Projected Equipment Needs:  Wheelchair, tub bench vs. Shower chair Patient/family involved in discharge planning:  Yes  MD ELOS: 10days Medical Rehab Prognosis:  Good Assessment: 66 yo female with PVD, DM underwent recent BKA, now requiring 24/7 rehab RN,MD, CIR level PT/OT

## 2012-04-26 NOTE — Progress Notes (Signed)
Physical Therapy Treatment Patient Details Name: [name] MRN: [id number] DOB: [birth year] Today's Date: 04/26/2012 Time: 1308-6578 PT Time Calculation (min): 31 min  PT Assessment / Plan / Recommendation Comments on Treatment Session  Pt s/p rt BKA. Pt very motivated and eager to get to rehab.  Pt with good tolerance of all activity.  Ready for rehab.    Follow Up Recommendations  Post acute inpatient     Does the patient have the potential to tolerate intense rehabilitation  Yes, Recommend IP Rehab Screening  Barriers to Discharge        Equipment Recommendations  Wheelchair (measurements)    Recommendations for Other Services    Frequency Min 5X/week   Plan Discharge plan remains appropriate;Frequency remains appropriate    Precautions / Restrictions Precautions Precautions: Fall   Pertinent Vitals/Pain Stable    Mobility  Bed Mobility Bed Mobility: Rolling Left Rolling Left: 5: Supervision;With rail Supine to Sit: 4: Min guard;HOB elevated;With rails Sitting - Scoot to Edge of Bed: 4: Min guard Details for Bed Mobility Assistance: Verbal cues for technique. Transfers Sit to Stand: 4: Min assist;1: +2 Total assist;From bed;With armrests;With upper extremity assist;From chair/3-in-1 Sit to Stand: Patient Percentage: 80% Stand to Sit: 4: Min assist;With upper extremity assist;With armrests;To chair/3-in-1 Stand Pivot Transfers: 4: Min assist Transfer via Lift Equipment: Stedy Details for Transfer Assistance: With Stedy pt is min A to come to stand since the Robbins is designed for pt to pull up on.  With walker pt requiring 2 person assist for balance as pt moves her hands from chair armrests to the walker.  Ambulation/Gait Ambulation/Gait Assistance: 1: +2 Total assist Ambulation/Gait: Patient Percentage: 80% Ambulation Distance (Feet): 15 Feet Assistive device: Rolling walker Ambulation/Gait Assistance Details: Verbal cues to stay more upright. Gait Pattern:  Trunk flexed;Step-to pattern    Exercises Amputee Exercises Quad Sets: AAROM;Both;10 reps;Seated Hip Extension: AROM;Strengthening;Right;10 reps;Sidelying Hip Flexion/Marching: Strengthening;Right;10 reps;Supine Knee Extension: AROM;Right;10 reps;Seated   PT Diagnosis:    PT Problem List:   PT Treatment Interventions:     PT Goals Acute Rehab PT Goals PT Goal: Supine/Side to Sit - Progress: Progressing toward goal PT Goal: Sit to Stand - Progress: Progressing toward goal PT Goal: Stand to Sit - Progress: Progressing toward goal PT Transfer Goal: Bed to Chair/Chair to Bed - Progress: Progressing toward goal PT Goal: Stand - Progress: Progressing toward goal PT Goal: Ambulate - Progress: Progressing toward goal  Visit Information  Last PT Received On: 04/26/12 Assistance Needed: +2    Subjective Data  Subjective: Pt states she feels better today and is ready for therapy.   Cognition  Overall Cognitive Status: Appears within functional limits for tasks assessed/performed Area of Impairment: Attention Arousal/Alertness: Awake/alert Orientation Level: Appears intact for tasks assessed    Balance  Static Standing Balance Static Standing - Balance Support: Bilateral upper extremity supported (on walker) Static Standing - Level of Assistance: 4: Min assist  End of Session PT - End of Session Equipment Utilized During Treatment: Gait belt Activity Tolerance: Patient tolerated treatment well Patient left: in chair;with call bell/phone within reach;with family/visitor present Nurse Communication: Mobility status   GP     [name] 04/26/2012, 10:56 AM  [employer] PT [phone]

## 2012-04-26 NOTE — Progress Notes (Signed)
Orthopedic Tech Progress Note Patient Details:  [name] [birth year] [id number]  Patient ID: [name], female   DOB: 1945/07/08, 66 y.o.   MRN: [id number]   [name] 04/26/2012, 9:06 AM Called bio-tech for right limbguard

## 2012-04-26 NOTE — Telephone Encounter (Addendum)
Message copied by Shari Prows on Thu Apr 26, 2012  3:18 PM ------      Message from: Phillips Odor      Created: Thu Apr 26, 2012  2:11 PM                   ----- Message -----         From: [place], [place]         Sent: 04/26/2012   8:00 AM           To: Melene Plan, RN            4 week F/U BKA - Early  This pt already has a fu appt scheduled to see TFE on 05/22/12. I called pt and left a message as a reminder. awt

## 2012-04-26 NOTE — H&P (Signed)
Physical Medicine and Rehabilitation Admission H&P    No chief complaint on file. : [id number] Disbro is a 66 y.o. right-handed female with history of diabetes mellitus with peripheral neuropathy, coronary artery disease with PTCA, peripheral vascular disease with multiple revascularization procedures. Patient recently discharged 04/09/2012 after aortobifemoral bypass as well as thrombectomy of right femoral to below knee popliteal Gore-Tex graft extension and umbilical hernia repair 03/26/2012 per Dr. Arbie Cookey with hospital course complicated by respiratory failure which required intubation as well as flash pulmonary edema with elevated troponin and NSTEMI with cardiac catheterization completed showing patency of left circumflex stent. Patient admitted 04/23/2012 and presented with progressive ischemic changes of the right foot as well as increased rest pain. 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 Arbie Cookey. A limb guard was provided by [employer] Postoperative pain management and currently maintained on home dose of Demerol as patient does have sensitivities to multiple narcotics. Placed on subcutaneous Lovenox for DVT prophylaxis. Postoperative anemia hemoglobin 8.0-9.0 and monitored. She did have some bouts of nausea vomiting felt to be related to her pain medication which was adjusted with good results. A PICC line remains in place as patient  difficult IV access. Physical and occupational therapy evaluations completed an ongoing. M.D/PT/OT.  requested physical medicine rehabilitation consult to consider inpatient rehabilitation services. Patient was felt to be due to candidate for inpatient rehabilitation services and was admitted for comprehensive rehabilitation program  No current complaints. Pain is well controlled.   Review of Systems  Cardiovascular: Positive for palpitations.  Gastrointestinal: Positive for constipation.    Musculoskeletal: Positive for myalgias.  Neurological: Positive for weakness.  Psychiatric/Behavioral:  Anxiety  All other systems reviewed and are negative   Past Medical History  Diagnosis Date  . Hypertension     Unspecified  . Hyperlipidemia     Mixed  . Tobacco abuse     Remote  . Loose, teeth     has loose bridge and two loose teeth holding it  . PONV (postoperative nausea and vomiting)   . Type II diabetes mellitus   . GERD (gastroesophageal reflux disease)   . Anxiety   . Peripheral vascular disease     unspecified, a. s/p L CEA b. s/p B fem-pop bypass  . Raynaud disease   . Stones in the urinary tract   . UTI (urinary tract infection)   . Anemia     hx  . Coronary artery disease     a. s/p NSTEMI 02/2009 - PCI LCX with Xience DES. Otherwise branch vessel and Dist. RCA dzs. NL EF.   Marland Kitchen Myocardial infarction   . CHF (congestive heart failure)   . Kidney stones   . UTI (lower urinary tract infection)   . Constipation   . Hernia, umbilical   . History of blood transfusion    Past Surgical History  Procedure Date  . Vesicovaginal fistula closure w/ tah   . Colonoscopy 11/2010  . Upper gastrointestinal endoscopy 11/2010  . Femoral artery - popliteal artery bypass graft     left  . Abdominal hysterectomy     complete  . Femoral-popliteal bypass graft 08/22/2011    Procedure: BYPASS GRAFT FEMORAL-POPLITEAL ARTERY;  Surgeon: Gretta Began, MD;  Location: [facility] OR;  Service: Vascular;  Laterality: Right;  Thrombectomy and Revision using 7mm x 10cm stretch goretex graft  . Intraoperative arteriogram 08/22/2011    Procedure: INTRA OPERATIVE ARTERIOGRAM;  Surgeon: Gretta Began, MD;  Location:  [facility] OR;  Service: Vascular;  Laterality: Right;  to lower leg  . Multiple tooth extractions 08-29-2011    5 teeth extracted   . Pci 01/17/12    RLE  . Breast lumpectomy     right  . Tonsillectomy and adenoidectomy   . Coronary angioplasty with stent placement   . Carotid endarterectomy ~  2005    left  . Aorta - bilateral femoral artery bypass graft 03/26/2012    Procedure: AORTA BIFEMORAL BYPASS GRAFT;  Surgeon: Larina Earthly, MD;  Location: [facility] OR;  Service: Vascular;  Laterality: N/A;  Aortic-bifemoral bypass using 14x54mm Hemashield graft .   . Umbilical hernia repair 03/26/2012    Procedure: HERNIA REPAIR UMBILICAL ADULT;  Surgeon: Larina Earthly, MD;  Location: [facility] OR;  Service: Vascular;  Laterality: N/A;  Removal of Umbilical hernia sac  . Femoral artery exploration 03/26/2012    Procedure: FEMORAL ARTERY EXPLORATION;  Surgeon: Larina Earthly, MD;  Location: [facility] OR;  Service: Vascular;  Laterality: Right;  with Revision of Popliteal-Peroneal bypass graft using 6mm x 10cm thin wall goretex graft  . Amputation 04/23/2012    Procedure: AMPUTATION BELOW KNEE;  Surgeon: Larina Earthly, MD;  Location: [facility] OR;  Service: Vascular;  Laterality: Right;   Family History  Problem Relation Age of Onset  . Heart attack Mother   . Heart attack Father   . Hypertension Brother   . Diabetes Brother   . Hypertension Brother    Social History:  reports that she quit smoking about 10 years ago. Her smoking use included Cigarettes. She has a 78 pack-year smoking history. She has never used smokeless tobacco. She reports that she drinks about 7 ounces of alcohol per week. She reports that she does not use illicit drugs. Allergies:  Allergies  Allergen Reactions  . Codeine Nausea And Vomiting  . Erythromycin Nausea And Vomiting  . Hydrocodone-Acetaminophen Nausea And Vomiting  . Hydromorphone Nausea And Vomiting  . Propoxyphene Hcl Nausea And Vomiting  . Statins Other (See Comments)    Leg cramps  . Acetaminophen Other (See Comments)    Does not tolerate well, nausea  . Eggs Or Egg-Derived Products     "pt does not eat"  . Ibuprofen Other (See Comments)    Does not tolerate well  . Shellfish-Derived Products     "pt does not eat"   Medications Prior to Admission  Medication Sig Dispense  Refill  . ALPRAZolam (XANAX) 0.25 MG tablet Take 0.25 mg by mouth every morning.       Marland Kitchen aspirin 325 MG EC tablet Take 325 mg by mouth every morning.       . calcium carbonate (TUMS) 500 MG chewable tablet Chew 1-4 tablets by mouth daily as needed. For acid reflux.      . Cholecalciferol 25000 UNITS CAPS Take 25,000 Units by mouth 3 (three) times a week. Monday, Wednesday, friday      . clopidogrel (PLAVIX) 75 MG tablet Take 75 mg by mouth daily.      Marland Kitchen dexlansoprazole (DEXILANT) 60 MG capsule Take 60 mg by mouth 2 (two) times daily.      . furosemide (LASIX) 40 MG tablet Take 1 tablet (40 mg total) by mouth daily.  30 tablet  1  . glyBURIDE (DIABETA) 2.5 MG tablet Take 2.5 mg by mouth every morning.       . iron polysaccharides (NIFEREX) 150 MG capsule Take 150 mg by mouth 2 (two) times daily.      Marland Kitchen  isosorbide mononitrate (IMDUR) 60 MG 24 hr tablet Take 1 tablet (60 mg total) by mouth daily.  30 tablet  1  . meperidine (DEMEROL) 50 MG tablet Take 50 mg by mouth every 4 (four) hours as needed. For pain      . metoprolol (LOPRESSOR) 50 MG tablet Take 1 tablet (50 mg total) by mouth 2 (two) times daily.  60 tablet  1  . nitroGLYCERIN (NITROSTAT) 0.4 MG SL tablet Place 0.4 mg under the tongue every 5 (five) minutes as needed. For chest pain      . omega-3 acid ethyl esters (LOVAZA) 1 G capsule Take 2 g by mouth 2 (two) times daily.       . pioglitazone-metformin (ACTOPLUS MET) 15-500 MG per tablet Take 1 tablet by mouth daily. Brand name only-does not work if components are given separately per patient      . potassium chloride (K-DUR) 10 MEQ tablet Take 1 tablet (10 mEq total) by mouth 2 (two) times daily.  60 tablet  1  . Probiotic Product (ACIDOPHILUS PEARLS PO) Take 1 capsule by mouth daily.      . prochlorperazine (COMPAZINE) 10 MG tablet Take 10 mg by mouth every 6 (six) hours as needed. For nausea      . ramipril (ALTACE) 1.25 MG capsule Take 2 capsules (2.5 mg total) by mouth daily.      .  sitaGLIPtan (JANUVIA) 100 MG tablet Take 100 mg by mouth at bedtime.         Home:     Functional History:    Functional Status:  Mobility:          ADL:    Cognition:       There were no vitals taken for this visit. Physical Exam  Vitals reviewed.  Constitutional: She is oriented to person, place, and time.  HENT:  Head: Normocephalic.  Eyes:  Pupils round and reactive to light  Neck: Neck supple. No thyromegaly present.  Cardiovascular:  Cardiac rate controlled  Pulmonary/Chest: Breath sounds normal. She has no wheezes.  Abdominal: Bowel sounds are normal. She exhibits no distension. There is no tenderness. Midline incision as well as bilateral inguinal incisions from vascular surgeries. No evidence of drainage no evidence of ecchymosis no tenderness Musculoskeletal:  Right leg in ACE wrap and appropriately tender. Expectedly swollen  Neurological: She is alert and oriented to person, place, and time. She has normal reflexes.  Follows simple commands. Strength in the [place] 5/5. LLE is grossly 4/5. Sensory exam decreased to LT below the lower calf. Cognitively quite alert and appropriate.  Skin:  Amputation site is dressed. Revascularization sites healing  Psychiatric: She has a normal mood and affect. Her behavior is normal. Judgment and thought content normal.  Sitting balance is good    Results for orders placed during the hospital encounter of 04/23/12 (from the past 48 hour(s))  GLUCOSE, CAPILLARY     Status: Abnormal   Collection Time   04/24/12  7:59 PM      Component Value Range Comment   Glucose-Capillary 158 (*) 70 - 99 mg/dL    Comment 1 Documented in Chart      Comment 2 Notify RN     BASIC METABOLIC PANEL     Status: Abnormal   Collection Time   04/25/12  5:00 AM      Component Value Range Comment   Sodium 135  135 - 145 mEq/L    Potassium 3.2 (*) 3.5 - 5.1 mEq/L  Chloride 93 (*) 96 - 112 mEq/L    CO2 28  19 - 32 mEq/L    Glucose, Bld  178 (*) 70 - 99 mg/dL    BUN 3 (*) 6 - 23 mg/dL    Creatinine, Ser 1.61 (*) 0.50 - 1.10 mg/dL    Calcium 8.7  8.4 - 09.6 mg/dL    GFR calc non Af Amer >90  >90 mL/min    GFR calc Af Amer >90  >90 mL/min   CBC     Status: Abnormal   Collection Time   04/25/12  5:00 AM      Component Value Range Comment   WBC 12.5 (*) 4.0 - 10.5 K/uL    RBC 3.36 (*) 3.87 - 5.11 MIL/uL    Hemoglobin 9.0 (*) 12.0 - 15.0 g/dL    HCT 04.5 (*) 40.9 - 46.0 %    MCV 81.8  78.0 - 100.0 fL    MCH 26.8  26.0 - 34.0 pg    MCHC 32.7  30.0 - 36.0 g/dL    RDW 81.1 (*) 91.4 - 15.5 %    Platelets 385  150 - 400 K/uL   GLUCOSE, CAPILLARY     Status: Abnormal   Collection Time   04/25/12  6:27 AM      Component Value Range Comment   Glucose-Capillary 166 (*) 70 - 99 mg/dL   GLUCOSE, CAPILLARY     Status: Abnormal   Collection Time   04/25/12 11:21 AM      Component Value Range Comment   Glucose-Capillary 172 (*) 70 - 99 mg/dL    Comment 1 Documented in Chart      Comment 2 Notify RN     GLUCOSE, CAPILLARY     Status: Abnormal   Collection Time   04/25/12  4:30 PM      Component Value Range Comment   Glucose-Capillary 148 (*) 70 - 99 mg/dL    Comment 1 Documented in Chart      Comment 2 Notify RN     GLUCOSE, CAPILLARY     Status: Abnormal   Collection Time   04/25/12  9:06 PM      Component Value Range Comment   Glucose-Capillary 122 (*) 70 - 99 mg/dL    Comment 1 Documented in Chart      Comment 2 Notify RN     GLUCOSE, CAPILLARY     Status: Abnormal   Collection Time   04/26/12  6:03 AM      Component Value Range Comment   Glucose-Capillary 147 (*) 70 - 99 mg/dL   GLUCOSE, CAPILLARY     Status: Abnormal   Collection Time   04/26/12 11:32 AM      Component Value Range Comment   Glucose-Capillary 149 (*) 70 - 99 mg/dL    Comment 1 Notify RN      No results found.  Post Admission Physician Evaluation: 1. Functional deficits secondary  to right below-knee amputation due to to perform vascular  disease associated with diabetes. 2. Patient is admitted to receive collaborative, interdisciplinary care between the physiatrist, rehab nursing staff, and therapy team. 3. Patient's level of medical complexity and substantial therapy needs in context of that medical necessity cannot be provided at a lesser intensity of care such as a SNF. 4. Patient has experienced substantial functional loss from his/her baseline which was documented above under the "Functional History" and "Functional Status" headings.  Judging by the patient's diagnosis, physical exam, and  functional history, the patient has potential for functional progress which will result in measurable gains while on inpatient rehab.  These gains will be of substantial and practical use upon discharge  in facilitating mobility and self-care at the household level. 5. Physiatrist will provide 24 hour management of medical needs as well as oversight of the therapy plan/treatment and provide guidance as appropriate regarding the interaction of the two. 6. 24 hour rehab nursing will assist with bladder management, bowel management, safety, skin/wound care, disease management, medication administration, pain management and patient education  and help integrate therapy concepts, techniques,education, etc. 7. PT will assess and treat for:  Pre-gait training gait training endurance, safety, equipment.  Goals are: modified independent short distance with walker. 8. OT will assess and treat for: ADLs, cognitive perceptual skills, safety, endurance, equipment.   Goals are: modified independent level for upper body ADLs and supervision lower body ADLs. 9. SLP will assess and treat for: not applicable.  Goals are: not applicable. 10. Case Management and Social Worker will assess and treat for psychological issues and discharge planning. 11. Team conference will be held weekly to assess progress toward goals and to determine barriers to discharge. 12. Patient  will receive at least 3 hours of therapy per day at least 5 days per week. 13. ELOS: 7-10 days      Prognosis:  good   Medical Problem List and Plan: 1. Right BKA secondary to peripheral vascular disease with multiple revascularization procedures/limb guard provided by [employer] 2. DVT Prophylaxis/Anticoagulation: Subcutaneous Lovenox. Monitor platelet counts and any signs of bleeding 3. Pain Management: Demerol 50 mg every 4 hours as needed. Monitor mental status closely and any signs of increased nausea 4. Mood:  Xanax 0.25 mg 3 times daily as needed. Provide emotional support and positive reinforcement 5. Neuropsych: This patient is capable of making decisions on his/her own behalf. 6.Postoperative anemia .Niferex twice a day. Followup CBC  7. Coronary artery disease with PTCA . continue aspirin Plavix therapy. Patient denies any chest pain or shortness of breath  8. Diabetes mellitus with peripheral neuropathy. Latest hemoglobin A1c of 6.3. Glyburide 2.5 mg daily, trandjenta 5 mg each bedtime . Check blood sugars a.c. and at bedtime  9. Diastolic congestive heart failure. Lasix 40 mg daily. Monitor for any signs of fluid overload  10. Hypertension . imdur 60 mg daily, Altace 2.5 mg twice a day, Lopressor 50 mg twice a day  11. Constipation. Adjust bowel program as needed   04/26/2012, 5:07 PM

## 2012-04-26 NOTE — Progress Notes (Signed)
Patient received at 1600 via wheelchair . Patient reported minimal pain . Limbguard brace intact to left leg . Oriented patient and husband to room and call bell system . Patient and husband verbalized understanding of rehab process. Continue with plan of care .                                               [name]

## 2012-04-26 NOTE — Progress Notes (Signed)
Covering CSW observed that pt will dc today to [facility]. No further CSW needs, CSW signing off.  Theresia Bough, MSW, Theresia Majors [phone]

## 2012-04-26 NOTE — Progress Notes (Signed)
Orthopedic Tech Progress Note Patient Details:  Vannia Pola Oct 13, 1945 [id number]  Patient ID: [name], female   DOB: [birth year], 66 y.o.   MRN: [id number]   [name] 04/26/2012, 11:12 AM LIMB GUARD  COMPLETED BY MICHAEL NEAL FOR [employer]

## 2012-04-27 ENCOUNTER — Inpatient Hospital Stay (HOSPITAL_COMMUNITY): Payer: Medicare Other | Admitting: Occupational Therapy

## 2012-04-27 ENCOUNTER — Inpatient Hospital Stay (HOSPITAL_COMMUNITY): Payer: Medicare Other | Admitting: Physical Therapy

## 2012-04-27 ENCOUNTER — Other Ambulatory Visit: Payer: Self-pay | Admitting: Cardiovascular Disease

## 2012-04-27 DIAGNOSIS — L98499 Non-pressure chronic ulcer of skin of other sites with unspecified severity: Secondary | ICD-10-CM

## 2012-04-27 DIAGNOSIS — I739 Peripheral vascular disease, unspecified: Secondary | ICD-10-CM

## 2012-04-27 DIAGNOSIS — Z89519 Acquired absence of unspecified leg below knee: Secondary | ICD-10-CM

## 2012-04-27 DIAGNOSIS — S78119A Complete traumatic amputation at level between unspecified hip and knee, initial encounter: Secondary | ICD-10-CM

## 2012-04-27 LAB — CBC WITH DIFFERENTIAL/PLATELET
Basophils Relative: 0 % (ref 0–1)
Eosinophils Absolute: 0.3 10*3/uL (ref 0.0–0.7)
Hemoglobin: 8.1 g/dL — ABNORMAL LOW (ref 12.0–15.0)
Lymphs Abs: 1 10*3/uL (ref 0.7–4.0)
MCH: 26.3 pg (ref 26.0–34.0)
MCHC: 32.1 g/dL (ref 30.0–36.0)
Monocytes Relative: 7 % (ref 3–12)
Neutro Abs: 6.1 10*3/uL (ref 1.7–7.7)
Neutrophils Relative %: 77 % (ref 43–77)
Platelets: 380 10*3/uL (ref 150–400)
RBC: 3.08 MIL/uL — ABNORMAL LOW (ref 3.87–5.11)

## 2012-04-27 LAB — COMPREHENSIVE METABOLIC PANEL
ALT: 15 U/L (ref 0–35)
Albumin: 2.1 g/dL — ABNORMAL LOW (ref 3.5–5.2)
Alkaline Phosphatase: 73 U/L (ref 39–117)
BUN: 6 mg/dL (ref 6–23)
Chloride: 96 mEq/L (ref 96–112)
Potassium: 2.9 mEq/L — ABNORMAL LOW (ref 3.5–5.1)
Sodium: 136 mEq/L (ref 135–145)
Total Bilirubin: 0.2 mg/dL — ABNORMAL LOW (ref 0.3–1.2)
Total Protein: 5.6 g/dL — ABNORMAL LOW (ref 6.0–8.3)

## 2012-04-27 LAB — GLUCOSE, CAPILLARY
Glucose-Capillary: 105 mg/dL — ABNORMAL HIGH (ref 70–99)
Glucose-Capillary: 76 mg/dL (ref 70–99)
Glucose-Capillary: 156 mg/dL — ABNORMAL HIGH (ref 70–99)

## 2012-04-27 MED ORDER — POTASSIUM CHLORIDE ER 10 MEQ PO TBCR
20.0000 meq | EXTENDED_RELEASE_TABLET | Freq: Two times a day (BID) | ORAL | Status: DC
  Administered 2012-04-27 (×2): 20 meq via ORAL
  Filled 2012-04-27 (×5): qty 2

## 2012-04-27 NOTE — Progress Notes (Signed)
Physical Therapy Session Note  Patient Details  Name: [name] MRN: [id number] Date of Birth: [birth year]  Today's Date: 04/27/2012 Time: 4098-1191 Time Calculation (min): 47 min  Short Term Goals: Week 1:  PT Short Term Goal 1 (Week 1): Pt will perform sit <> stand with supervision.  PT Short Term Goal 2 (Week 1): Pt will perform bed <> chair transfer with min assist.  PT Short Term Goal 3 (Week 1): Pt will perform 15' ambulation with moderate assist PT Short Term Goal 4 (Week 1): Pt will verbalize complience with HEP outside of therapy session.  Skilled Therapeutic Interventions/Progress Updates:    Stand pivot transfers bed >wheelchair <> mat performed with moderate assist however pt able to perform sit <> stand with min assist demonstrating improvement from evaluation. Cues and assist needed to prevent premature sitting, encourage full pivot prior to sitting. Performed Rt. hip flexor, discussed and performed visualization/imagery for phantom sensation. Discussed application of pressure over residual limb for pain as well as use of towel to break up scar tissue and promote desensitization of residual limb (advised not to start until limb has healed more). Performed basic Rt. BKA exercises: glute sets, isometric hip extension, side lying hip extension (x 10 reps each) and long arc quads x 20 reps. Pt given HEP handout and handout for residual limb wrapping.   Therapy Documentation Precautions:  Precautions Precautions: Fall Precaution Comments: Limb guard Rt. LE  Restrictions Weight Bearing Restrictions: Yes Pain: Pain Assessment Pain Assessment: No/denies pain  See FIM for current functional status  Therapy/Group: Individual Therapy  Wilhemina Bonito 04/27/2012, 5:10 PM

## 2012-04-27 NOTE — Plan of Care (Signed)
Hypoglycemic Event  CBG:48  Treatment: 15 GM carbohydrate snack, orange juice  Symptoms: Sweaty  Follow-up CBG: Time:1501 CBG Result:64  Possible Reasons for Event: Unknown  Comments/MD notified: Harvel Ricks, PA notified and check on patient.  Recheck CBG in 15 minutes.    [name]  Remember to initiate Hypoglycemia Order Set & complete

## 2012-04-27 NOTE — Evaluation (Signed)
Physical Therapy Assessment and Plan  Patient Details  Name: [name] MRN: [id number] Date of Birth: August 25, 1945  PT Diagnosis: Abnormality of gait, Difficulty walking, Impaired cognition, Impaired sensation, Muscle weakness and Pain in residual limb Rehab Potential: Good ELOS: ~2 weeks   Today's Date: 04/27/2012 Time: 4098-1191 Time Calculation (min): 60 min  Problem List:  Patient Active Problem List  Diagnosis  . DIABETES MELLITUS  . HYPERLIPIDEMIA-MIXED  . HYPERTENSION, UNSPECIFIED  . CORONARY ATHEROSCLEROSIS NATIVE CORONARY ARTERY  . CAROTID OCCLUSIVE DISEASE  . UNSPECIFIED PERIPHERAL VASCULAR DISEASE  . Atherosclerosis of native arteries of the extremities with intermittent claudication  . Atherosclerosis of native arteries of the extremities with ulceration  . Chronic ulcer of unspecified site  . Cellulitis  . Atherosclerotic PVD with intermittent claudication  . Pain in limb  . Respiratory failure, post-operative  . Acute respiratory failure with hypoxia  . Pulmonary edema, acute  . Acute combined systolic and diastolic heart failure  . Acute myocardial infarction, subendocardial infarction, initial episode of care  . S/P BKA (below knee amputation)    Past Medical History:  Past Medical History  Diagnosis Date  . Hypertension     Unspecified  . Hyperlipidemia     Mixed  . Tobacco abuse     Remote  . Loose, teeth     has loose bridge and two loose teeth holding it  . PONV (postoperative nausea and vomiting)   . Type II diabetes mellitus   . GERD (gastroesophageal reflux disease)   . Anxiety   . Peripheral vascular disease     unspecified, a. s/p L CEA b. s/p B fem-pop bypass  . Raynaud disease   . Stones in the urinary tract   . UTI (urinary tract infection)   . Anemia     hx  . Coronary artery disease     a. s/p NSTEMI 02/2009 - PCI LCX with Xience DES. Otherwise branch vessel and Dist. RCA dzs. NL EF.   Marland Kitchen Myocardial infarction   . CHF  (congestive heart failure)   . Kidney stones   . UTI (lower urinary tract infection)   . Constipation   . Hernia, umbilical   . History of blood transfusion    Past Surgical History:  Past Surgical History  Procedure Date  . Vesicovaginal fistula closure w/ tah   . Colonoscopy 11/2010  . Upper gastrointestinal endoscopy 11/2010  . Femoral artery - popliteal artery bypass graft     left  . Abdominal hysterectomy     complete  . Femoral-popliteal bypass graft 08/22/2011    Procedure: BYPASS GRAFT FEMORAL-POPLITEAL ARTERY;  Surgeon: Gretta Began, MD;  Location: [facility] OR;  Service: Vascular;  Laterality: Right;  Thrombectomy and Revision using 7mm x 10cm stretch goretex graft  . Intraoperative arteriogram 08/22/2011    Procedure: INTRA OPERATIVE ARTERIOGRAM;  Surgeon: Gretta Began, MD;  Location: [facility] OR;  Service: Vascular;  Laterality: Right;  to lower leg  . Multiple tooth extractions 08-29-2011    5 teeth extracted   . Pci 01/17/12    RLE  . Breast lumpectomy     right  . Tonsillectomy and adenoidectomy   . Coronary angioplasty with stent placement   . Carotid endarterectomy ~ 2005    left  . Aorta - bilateral femoral artery bypass graft 03/26/2012    Procedure: AORTA BIFEMORAL BYPASS GRAFT;  Surgeon: Larina Earthly, MD;  Location: [facility] OR;  Service: Vascular;  Laterality: N/A;  Aortic-bifemoral bypass using 14x35mm Hemashield  graft .   . Umbilical hernia repair 03/26/2012    Procedure: HERNIA REPAIR UMBILICAL ADULT;  Surgeon: Larina Earthly, MD;  Location: [facility] OR;  Service: Vascular;  Laterality: N/A;  Removal of Umbilical hernia sac  . Femoral artery exploration 03/26/2012    Procedure: FEMORAL ARTERY EXPLORATION;  Surgeon: Larina Earthly, MD;  Location: [facility] OR;  Service: Vascular;  Laterality: Right;  with Revision of Popliteal-Peroneal bypass graft using 6mm x 10cm thin wall goretex graft  . Amputation 04/23/2012    Procedure: AMPUTATION BELOW KNEE;  Surgeon: Larina Earthly, MD;  Location: [facility] OR;   Service: Vascular;  Laterality: Right;    Assessment & Plan Clinical Impression: [name] is a 66 y.o. right-handed female with history of diabetes mellitus with peripheral neuropathy, coronary artery disease with PTCA, peripheral vascular disease with multiple revascularization procedures. Patient recently discharged 04/09/2012 after aortobifemoral bypass as well as thrombectomy of right femoral to below knee popliteal Gore-Tex graft extension and umbilical hernia repair 03/26/2012 per Dr. Arbie Cookey with hospital course complicated by respiratory failure which required intubation as well as flash pulmonary edema with elevated troponin and NSTEMI with cardiac catheterization completed showing patency of left circumflex stent. Patient admitted 04/23/2012 and presented with progressive ischemic changes of the right foot as well as increased rest pain. 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 Arbie Cookey. A limb guard was provided by [employer] Postoperative pain management and currently maintained on home dose of Demerol as patient does have sensitivities to multiple narcotics. She did have some bouts of nausea vomiting felt to be related to her pain medication which was adjusted with good results. Patient transferred to CIR on 04/26/2012 .   Patient currently requires mod assist with transfers, +2 total assist with ambulation secondary to muscle weakness, decreased cardiorespiratoy endurance, impaired timing and sequencing and unbalanced muscle activation and decreased standing balance and decreased balance strategies. Pt currently most limited by anxiousness with mobility, deconditioning, impaired standing balance, decreased functional mobility, and increased need for assist. Pt is also having noxious phantom sensation and will benefit from techniques to decrease this. Pt will also benefit from preprosthetic training and education. Prior to  hospitalization, patient was min assist s/p surgeries, prior to surgeries pt was independent with mobility and lived with Spouse in a House home.  Home access is 3Stairs to enter.  Patient will benefit from skilled PT intervention to maximize safe functional mobility, minimize fall risk and decrease caregiver burden for planned discharge home with intermittent assist.  Anticipate patient will benefit from follow up OP at discharge.  PT - End of Session Endurance Deficit: Yes PT Assessment Rehab Potential: Good Barriers to Discharge: Inaccessible home environment PT Plan PT Frequency: 2-3 X/day, 60-90 minutes Estimated Length of Stay: ~2 weeks PT Treatment/Interventions: Ambulation/gait training;Balance/vestibular training;Community reintegration;Discharge planning;Disease management/prevention;DME/adaptive equipment instruction;Psychosocial support;Functional mobility training;Neuromuscular re-education;Pain management;Patient/family education;Skin care/wound management;Splinting/orthotics;Stair training;Therapeutic Activities;Therapeutic Exercise;UE/LE Strength taining/ROM;Wheelchair propulsion/positioning PT Recommendation Follow Up Recommendations: Outpatient PT;Home health PT (HHPT until prosthesis received. ) Equipment Recommended: Wheelchair (measurements);Wheelchair cushion (measurements)  PT Evaluation Precautions/Restrictions Precautions Precautions: Fall Precaution Comments: Limb guard Rt. LE   Vital Signs Therapy Vitals BP: 178/70 mmHg Patient Position, if appropriate: Lying Pain Pain Assessment Pain Assessment: 0-10 Pain Score: 0-No pain Pain Type: Surgical pain Pain Location: Leg Pain Orientation: Right Pain Descriptors: Aching Pain Onset: On-going Pain Intervention(s): RN made aware;Repositioned Home Living/Prior Functioning Home Living Lives With: Spouse Available Help at Discharge: Family;Available  24 hours/day Type of Home: House Home Access: Stairs to  enter [employer] of Steps: 3 Entrance Stairs-Rails: Right (or bil. 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   Cognition Orientation Level: Oriented X4 Comments: Slow processing Sensation Sensation Light Touch: Impaired Detail (peripheral neuropathy)  Mobility Bed Mobility Bed Mobility: Supine to Sit;Sit to Supine Supine to Sit: 4: Min assist Supine to Sit Details (indicate cue type and reason): Cues needed for sequencing and efficiency as pt had become relient on railing use. Pt fearful of falling OOB. Transfers Sit to Stand: 3: Mod assist;From bed;From chair/3-in-1 Sit to Stand Details (indicate cue type and reason): Verbal cues for seqeuncing and safe UE placment.  Stand to Sit: 3: Mod assist;To bed;To chair/3-in-1 Stand to Sit Details: Cues for safety, saef UE placement.  Stand Pivot Transfers: 3: Mod assist Stand Pivot Transfer Details (indicate cue type and reason): Step by step sequencing for pivoting foot and UE placement.  Locomotion  Ambulation Ambulation/Gait Assistance: 1: +2 Total assist (pt= 70%) Ambulation Distance (Feet): 10 Feet Assistive device: Rolling walker Ambulation/Gait Assistance Details: Verbal cues for safe use of DME/AE Ambulation/Gait Assistance Details: Pt limited by fatigue. Decreased balance in standing.  Gait Gait: Yes Gait Pattern: Trunk flexed;Step-to pattern Stairs / Additional  Locomotion Stairs: No   Balance Balance Balance Assessed: Yes Static Sitting Balance Static Sitting - Balance Support: Bilateral upper extremity supported (foot supported) Static Sitting - Level of Assistance: 5: Stand by assistance Static Standing Balance Static Standing - Balance Support: Bilateral upper extremity supported Static Standing - Level of Assistance: 3: Mod assist Extremity Assessment      RLE Assessment RLE Assessment: Exceptions to [facility] RLE AROM (degrees) RLE Overall AROM Comments: Slight decreased knee extension, pt desired to keep in limb guard so not measured.  RLE Strength RLE Overall Strength Comments: 3/5 hip flexion, hip extension not testing. Knee flexion/extension not tested secondary to pt desiring to leave leg in limb guard. LLE Assessment LLE Assessment: Exceptions to WFL LLE AROM (degrees) LLE Overall AROM Comments: WFL LLE Strength LLE Overall Strength Comments: Generalized deconditioning, grossly >3+/5   Skilled Therapeutic Interventions/Progress Updates:  Stand pivot to/from bedside commode performed with mod assist. Pt anxious with mobility and needs reassurance and step by step cues for transfers. Discussed phantom sensation and will perform methods to reduce next treatment session. Discussed importance of positioning Rt. LE in bed and wheelchair to decrease risk for contracture and promote future prosthetic fit.   See FIM for current functional status Refer to Care Plan for Long Term Goals  Recommendations for other services: None  Discharge Criteria: Patient will be discharged from PT if patient refuses treatment 3 consecutive times without medical reason, if treatment goals not met, if there is a change in medical status, if patient makes no progress towards goals or if patient is discharged from hospital.  The above assessment, treatment plan, treatment alternatives and goals were discussed and mutually agreed upon: by patient  Wilhemina Bonito 04/27/2012, 10:24 AM

## 2012-04-27 NOTE — Progress Notes (Signed)
Patient ID: [name], female   DOB: 1946/03/08, 66 y.o.   MRN: [id number] [name] is a 66 y.o. right-handed female with history of diabetes mellitus with peripheral neuropathy, coronary artery disease with PTCA, peripheral vascular disease with multiple revascularization procedures. Patient recently discharged 04/09/2012 after aortobifemoral bypass as well as thrombectomy of right femoral to below knee popliteal Gore-Tex graft extension and umbilical hernia repair 03/26/2012 per Dr. Arbie Cookey with hospital course complicated by respiratory failure which required intubation as well as flash pulmonary edema with elevated troponin and NSTEMI with cardiac catheterization completed showing patency of left circumflex stent. Patient admitted 04/23/2012 and presented with progressive ischemic changes of the right foot as well as increased rest pain. 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 Arbie Cookey. A limb guard was provided by [employer]  Subjective/Complaints: Wants to use home meds (brand name vs generics) Review of Systems  Musculoskeletal: Positive for joint pain.  Psychiatric/Behavioral: The patient is nervous/anxious. The patient does not have insomnia.    Objective: Vital Signs: Blood pressure 178/70, pulse 79, temperature 98.6 F (37 C), temperature source Oral, resp. rate 18, weight 57.8 kg (127 lb 6.8 oz), SpO2 93.00%. No results found. Results for orders placed during the hospital encounter of 04/26/12 (from the past 72 hour(s))  CBC     Status: Abnormal   Collection Time   04/26/12  5:02 PM      Component Value Range Comment   WBC 10.0  4.0 - 10.5 K/uL    RBC 3.01 (*) 3.87 - 5.11 MIL/uL    Hemoglobin 8.0 (*) 12.0 - 15.0 g/dL    HCT 78.2 (*) 95.6 - 46.0 %    MCV 81.7  78.0 - 100.0 fL    MCH 26.6  26.0 - 34.0 pg    MCHC 32.5  30.0 - 36.0 g/dL    RDW 21.3 (*) 08.6 - 15.5 %    Platelets 370  150 - 400 K/uL     CREATININE, SERUM     Status: Normal   Collection Time   04/26/12  5:02 PM      Component Value Range Comment   Creatinine, Ser 0.53  0.50 - 1.10 mg/dL    GFR calc non Af Amer >90  >90 mL/min    GFR calc Af Amer >90  >90 mL/min   GLUCOSE, CAPILLARY     Status: Abnormal   Collection Time   04/26/12  5:53 PM      Component Value Range Comment   Glucose-Capillary 145 (*) 70 - 99 mg/dL    Comment 1 Notify RN     GLUCOSE, CAPILLARY     Status: Abnormal   Collection Time   04/26/12  9:00 PM      Component Value Range Comment   Glucose-Capillary 150 (*) 70 - 99 mg/dL    Comment 1 Notify RN     CBC WITH DIFFERENTIAL     Status: Abnormal   Collection Time   04/27/12  5:00 AM      Component Value Range Comment   WBC 8.0  4.0 - 10.5 K/uL    RBC 3.08 (*) 3.87 - 5.11 MIL/uL    Hemoglobin 8.1 (*) 12.0 - 15.0 g/dL    HCT 57.8 (*) 46.9 - 46.0 %    MCV 81.8  78.0 - 100.0 fL    MCH 26.3  26.0 - 34.0 pg    MCHC 32.1  30.0 - 36.0  g/dL    RDW 14.7 (*) 82.9 - 15.5 %    Platelets 380  150 - 400 K/uL    Neutrophils Relative 77  43 - 77 %    Neutro Abs 6.1  1.7 - 7.7 K/uL    Lymphocytes Relative 12  12 - 46 %    Lymphs Abs 1.0  0.7 - 4.0 K/uL    Monocytes Relative 7  3 - 12 %    Monocytes Absolute 0.6  0.1 - 1.0 K/uL    Eosinophils Relative 4  0 - 5 %    Eosinophils Absolute 0.3  0.0 - 0.7 K/uL    Basophils Relative 0  0 - 1 %    Basophils Absolute 0.0  0.0 - 0.1 K/uL   COMPREHENSIVE METABOLIC PANEL     Status: Abnormal   Collection Time   04/27/12  5:00 AM      Component Value Range Comment   Sodium 136  135 - 145 mEq/L    Potassium 2.9 (*) 3.5 - 5.1 mEq/L    Chloride 96  96 - 112 mEq/L    CO2 29  19 - 32 mEq/L    Glucose, Bld 99  70 - 99 mg/dL    BUN 6  6 - 23 mg/dL    Creatinine, Ser 5.62 (*) 0.50 - 1.10 mg/dL    Calcium 8.6  8.4 - 13.0 mg/dL    Total Protein 5.6 (*) 6.0 - 8.3 g/dL    Albumin 2.1 (*) 3.5 - 5.2 g/dL    AST 23  0 - 37 U/L    ALT 15  0 - 35 U/L    Alkaline  Phosphatase 73  39 - 117 U/L    Total Bilirubin 0.2 (*) 0.3 - 1.2 mg/dL    GFR calc non Af Amer >90  >90 mL/min    GFR calc Af Amer >90  >90 mL/min   GLUCOSE, CAPILLARY     Status: Abnormal   Collection Time   04/27/12  7:15 AM      Component Value Range Comment   Glucose-Capillary 105 (*) 70 - 99 mg/dL    Comment 1 Notify RN       HEENT: normal Cardio: RRR Resp: CTA B/L GI: BS positive Extremity:  No Edema Skin:   Other R BKA  ACE  Neuro: Alert/Oriented Musc/Skel:  Other Able to flex R hip , abd and add   Assessment/Plan: 1. Functional deficits secondary to R BKA due to PVD associated with DM which require 3+ hours per day of interdisciplinary therapy in a comprehensive inpatient rehab setting. Physiatrist is providing close team supervision and 24 hour management of active medical problems listed below. Physiatrist and rehab team continue to assess barriers to discharge/monitor patient progress toward functional and medical goals. FIM:       FIM - Toileting Toileting: 0: Activity did not occur  FIM - Archivist Transfers: 0-Activity did not occur  FIM - Games developer Transfer: 1: Two helpers     Comprehension Comprehension Mode: Auditory Comprehension: 5-Understands basic 90% of the time/requires cueing < 10% of the time  Expression Expression Mode: Verbal Expression: 5-Expresses basic needs/ideas: With extra time/assistive device  Social Interaction Social Interaction: 5-Interacts appropriately 90% of the time - Needs monitoring or encouragement for participation or interaction.  Problem Solving Problem Solving: 5-Solves basic 90% of the time/requires cueing < 10% of the time  Memory Memory: 4-Recognizes or recalls 75 - 89% of the  time/requires cueing 10 - 24% of the time  Medical Problem List and Plan:  1. Right BKA secondary to peripheral vascular disease with multiple revascularization procedures/limb guard provided by  [employer]  2. DVT Prophylaxis/Anticoagulation: Subcutaneous Lovenox. Monitor platelet counts and any signs of bleeding  3. Pain Management: Demerol 50 mg every 4 hours as needed. Monitor mental status closely and any signs of increased nausea  4. Mood: Xanax 0.25 mg 3 times daily as needed. Provide emotional support and positive reinforcement  5. Neuropsych: This patient is capable of making decisions on his/her own behalf.  6.Postoperative anemia .Niferex twice a day. Followup CBC  7. Coronary artery disease with PTCA . continue aspirin Plavix therapy. Patient denies any chest pain or shortness of breath  8. Diabetes mellitus with peripheral neuropathy. Latest hemoglobin A1c of 6.3. Glyburide 2.5 mg daily, trandjenta 5 mg each bedtime . Check blood sugars a.c. and at bedtime  9. Diastolic congestive heart failure. Lasix 40 mg daily. Monitor for any signs of fluid overload  10. Hypertension . imdur 60 mg daily, Altace 2.5 mg twice a day, Lopressor 50 mg twice a day  11. Constipation. Adjust bowel program as needed     LOS (Days) 1 A FACE TO FACE EVALUATION WAS PERFORMED  KIRSTEINS,ANDREW E 04/27/2012, 8:06 AM

## 2012-04-27 NOTE — Progress Notes (Signed)
NT reported patient sweating, checked blood sugar at 1435 was 48.  Harvel Ricks, PA notified, and assessed the patient.  Patient denies other symptoms, recheck patient's blood sugar after given juice and crackers CBG 94.   Patient LBM 10/20, attempted to give suppository after therapy, patient refuse states she is expecting a visitor and prefer after visitor left.  Night nurse is aware.  Will continue with plan of care.

## 2012-04-27 NOTE — Evaluation (Signed)
Occupational Therapy Assessment and Plan  Patient Details  Name: [name] MRN: [id number] Date of Birth: 15-Dec-1945  OT Diagnosis: muscle weakness (generalized) Rehab Potential: Rehab Potential: Good ELOS: 10 - 12 days   Today's Date: 04/27/2012 Time: 1000-1100 Time Calculation (min): 60 min  1:1 Pt seen for the initial evaluation and for BADL retraining of toileting, bathing, and dressing with a focus on Sit to stand and standing balance.  Pt was initially fearful, but became more confident as the session progressed.  She did well managing her clothing up/down hips with steady assist as she held onto the grab bar.  Problem List:  Patient Active Problem List  Diagnosis  . DIABETES MELLITUS  . HYPERLIPIDEMIA-MIXED  . HYPERTENSION, UNSPECIFIED  . CORONARY ATHEROSCLEROSIS NATIVE CORONARY ARTERY  . CAROTID OCCLUSIVE DISEASE  . UNSPECIFIED PERIPHERAL VASCULAR DISEASE  . Atherosclerosis of native arteries of the extremities with intermittent claudication  . Atherosclerosis of native arteries of the extremities with ulceration  . Chronic ulcer of unspecified site  . Cellulitis  . Atherosclerotic PVD with intermittent claudication  . Pain in limb  . Respiratory failure, post-operative  . Acute respiratory failure with hypoxia  . Pulmonary edema, acute  . Acute combined systolic and diastolic heart failure  . Acute myocardial infarction, subendocardial infarction, initial episode of care  . S/P BKA (below knee amputation)    Past Medical History:  Past Medical History  Diagnosis Date  . Hypertension     Unspecified  . Hyperlipidemia     Mixed  . Tobacco abuse     Remote  . Loose, teeth     has loose bridge and two loose teeth holding it  . PONV (postoperative nausea and vomiting)   . Type II diabetes mellitus   . GERD (gastroesophageal reflux disease)   . Anxiety   . Peripheral vascular disease     unspecified, a. s/p L CEA b. s/p B fem-pop bypass  . Raynaud  disease   . Stones in the urinary tract   . UTI (urinary tract infection)   . Anemia     hx  . Coronary artery disease     a. s/p NSTEMI 02/2009 - PCI LCX with Xience DES. Otherwise branch vessel and Dist. RCA dzs. NL EF.   Marland Kitchen Myocardial infarction   . CHF (congestive heart failure)   . Kidney stones   . UTI (lower urinary tract infection)   . Constipation   . Hernia, umbilical   . History of blood transfusion    Past Surgical History:  Past Surgical History  Procedure Date  . Vesicovaginal fistula closure w/ tah   . Colonoscopy 11/2010  . Upper gastrointestinal endoscopy 11/2010  . Femoral artery - popliteal artery bypass graft     left  . Abdominal hysterectomy     complete  . Femoral-popliteal bypass graft 08/22/2011    Procedure: BYPASS GRAFT FEMORAL-POPLITEAL ARTERY;  Surgeon: Gretta Began, MD;  Location: [facility] OR;  Service: Vascular;  Laterality: Right;  Thrombectomy and Revision using 7mm x 10cm stretch goretex graft  . Intraoperative arteriogram 08/22/2011    Procedure: INTRA OPERATIVE ARTERIOGRAM;  Surgeon: Gretta Began, MD;  Location: [facility] OR;  Service: Vascular;  Laterality: Right;  to lower leg  . Multiple tooth extractions 08-29-2011    5 teeth extracted   . Pci 01/17/12    RLE  . Breast lumpectomy     right  . Tonsillectomy and adenoidectomy   . Coronary angioplasty with stent placement   .  Carotid endarterectomy ~ 2005    left  . Aorta - bilateral femoral artery bypass graft 03/26/2012    Procedure: AORTA BIFEMORAL BYPASS GRAFT;  Surgeon: Larina Earthly, MD;  Location: [facility] OR;  Service: Vascular;  Laterality: N/A;  Aortic-bifemoral bypass using 14x60mm Hemashield graft .   . Umbilical hernia repair 03/26/2012    Procedure: HERNIA REPAIR UMBILICAL ADULT;  Surgeon: Larina Earthly, MD;  Location: [facility] OR;  Service: Vascular;  Laterality: N/A;  Removal of Umbilical hernia sac  . Femoral artery exploration 03/26/2012    Procedure: FEMORAL ARTERY EXPLORATION;  Surgeon: Larina Earthly, MD;   Location: [facility] OR;  Service: Vascular;  Laterality: Right;  with Revision of Popliteal-Peroneal bypass graft using 6mm x 10cm thin wall goretex graft  . Amputation 04/23/2012    Procedure: AMPUTATION BELOW KNEE;  Surgeon: Larina Earthly, MD;  Location: [facility] OR;  Service: Vascular;  Laterality: Right;    Assessment & Plan Clinical Impression: [name] is a 66 y.o. right-handed female with history of diabetes mellitus with peripheral neuropathy, coronary artery disease with PTCA, peripheral vascular disease with multiple revascularization procedures. Patient recently discharged 04/09/2012 after aortobifemoral bypass as well as thrombectomy of right femoral to below knee popliteal Gore-Tex graft extension and umbilical hernia repair 03/26/2012 per Dr. Arbie Cookey with hospital course complicated by respiratory failure which required intubation as well as flash pulmonary edema with elevated troponin and NSTEMI with cardiac catheterization completed showing patency of left circumflex stent. Patient admitted 04/23/2012 and presented with progressive ischemic changes of the right foot as well as increased rest pain. 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 Arbie Cookey. A limb guard was provided by [employer] Postoperative pain management and currently maintained on home dose of Demerol as patient does have sensitivities to multiple narcotics. Placed on subcutaneous Lovenox for DVT prophylaxis. Postoperative anemia hemoglobin 8.0-9.0 and monitored. She did have some bouts of nausea vomiting felt to be related to her pain medication which was adjusted with good results. A PICC line remains in place as patient  difficult IV access.  Patient transferred to CIR on 04/26/2012 .    Patient currently requires mod with basic self-care skills of LB ADLs secondary to muscle weakness and decreased standing balance.  Prior to hospitalization, patient could complete all ADLs  and IADLs independently and was very active in the community.  Patient will benefit from skilled intervention to increase independence with basic self-care skills prior to discharge home with care partner.  Anticipate patient will require intermittent supervision and no further OT follow recommended.  OT - End of Session Endurance Deficit: Yes OT Assessment Rehab Potential: Good Barriers to Discharge: None OT Plan OT Frequency: 1-2 X/day, 60-90 minutes;5 out of 7 days Estimated Length of Stay: 10 - 12 days OT Treatment/Interventions: Balance/vestibular training;Discharge planning;DME/adaptive equipment instruction;Functional mobility training;Patient/family education;Self Care/advanced ADL retraining;Therapeutic Activities;Therapeutic Exercise;UE/LE Strength taining/ROM OT Recommendation Equipment Recommended: Tub/shower seat;Tub/shower bench;Rolling walker with 5" wheels (to be determined)  OT Evaluation Precautions/Restrictions  Precautions Precautions: Fall Precaution Comments: Limb guard Rt. LE    Pain Pain Assessment Pain Assessment: No/denies pain Pain Score: 0-No pain Pain Type: Surgical pain Pain Location: Leg Pain Orientation: Right Pain Descriptors: Aching Pain Onset: On-going Pain Intervention(s): RN made aware;Repositioned Home Living/Prior Functioning Home Living Lives With: Spouse Available Help at Discharge: Family;Available 24 hours/day Type of Home: House Home Access: Stairs to enter [employer] of Steps: 3 Entrance Stairs-Rails: Right (or  bil. 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  ADL - Refer to FIM   Vision/Perception  Vision - History Baseline Vision: Wears glasses only for reading Patient Visual Report: No change from baseline Vision - Assessment Eye Alignment: Within Functional Limits Perception Perception: Within Functional Limits Praxis Praxis: Intact  Cognition Overall Cognitive Status: Appears within functional limits for tasks assessed Orientation Level: Oriented X4 Comments: Slow processing Sensation Sensation Light Touch: Impaired Detail (peripheral neuropathy) Stereognosis: Appears Intact Hot/Cold: Appears Intact Coordination Gross Motor Movements are Fluid and Coordinated: Yes Fine Motor Movements are Fluid and Coordinated: Yes Motor  Motor Motor - Skilled Clinical Observations: generalized muscle weakness Mobility  Bed Mobility Bed Mobility: Supine to Sit;Sit to Supine Supine to Sit: 4: Min assist Supine to Sit Details (indicate cue type and reason): Cues needed for sequencing and efficiency as pt had become relient on railing use. Pt fearful of falling OOB. Transfers Sit to Stand: 3: Mod assist;From bed;From chair/3-in-1 Sit to Stand Details (indicate cue type and reason): Verbal cues for seqeuncing and safe UE placment.  Stand to Sit: 3: Mod assist;To bed;To chair/3-in-1 Stand to Sit Details: Cues for safety, saef UE placement.   Trunk/Postural Assessment  Cervical Assessment Cervical Assessment: Within Functional Limits Thoracic Assessment Thoracic Assessment: Within Functional Limits Lumbar Assessment Lumbar Assessment: Within Functional Limits Postural Control Postural Control: Within Functional Limits  Balance Balance Balance Assessed: Yes Static Sitting Balance Static Sitting - Balance Support: Bilateral upper extremity  supported (foot supported) Static Sitting - Level of Assistance: 5: Stand by assistance Static Standing Balance Static Standing - Balance Support: Bilateral upper extremity supported Static Standing - Level of Assistance: 3: Mod assist Dynamic Standing Balance Dynamic Standing - Level of Assistance: 3: Mod assist Extremity/Trunk Assessment RUE Assessment RUE Assessment: Within Functional Limits LUE Assessment LUE Assessment: Within Functional Limits  See FIM for current functional status Refer to Care Plan for Long Term Goals  Recommendations for other services: None  Discharge Criteria: Patient will be discharged from OT if patient refuses treatment 3 consecutive times without medical reason, if treatment goals not met, if there is a change in medical status, if patient makes no progress towards goals or if patient is discharged from hospital.  The above assessment, treatment plan, treatment alternatives and goals were discussed and mutually agreed upon: by patient and by family  SAGUIER,JULIA 04/27/2012, 11:29 AM

## 2012-04-27 NOTE — Progress Notes (Signed)
Patient ID: [name], female   DOB: September 10, 1945, 66 y.o.   MRN: [id number] Patient now in rehabilitation. Sitting up in chair and comfortable. Greatful to take a shower and where her regular Street close.   Right BKA dressing changes. She does have some oozing from the skin edge but no bleeding from the stump from a deeper space. The stump was completely viable with no evidence of skin breakdown.  I discussed this with the patient. Continue rehabilitation. Dr. Hart Rochester is on call this weekend. Please call if there are vascular issues. Otherwise I will see the patient again on Monday

## 2012-04-27 NOTE — Progress Notes (Signed)
Patient information reviewed and entered into eRehab system by Markeria Goetsch, RN, CRRN, PPS Coordinator.  Information including medical coding and functional independence measure will be reviewed and updated through discharge.     Per nursing patient was given "Data Collection Information Summary for Patients in Inpatient Rehabilitation Facilities with attached "Privacy Act Statement-Health Care Records" upon admission.  

## 2012-04-27 NOTE — Progress Notes (Signed)
Occupational Therapy Session Note  Patient Details  Name: Natassja Ollis MRN: [id number] Date of Birth: 07/17/1945  Today's Date: 04/27/2012 Time: 1130-1200 Time Calculation (min): 30 min  Short Term Goals: Week 1:  OT Short Term Goal 1 (Week 1): Pt will transfer to toilet and or BSC with RW with supervision. OT Short Term Goal 2 (Week 1): Pt will dress LB with supervision. OT Short Term Goal 3 (Week 1): Pt will bathe with supervision.  Skilled Therapeutic Interventions/Progress Updates:  Self care retraining to include discuss and practice transfers and safety related to limb loss and all mobility as it relates to BADL.  Patient practiced sit><stands and standing balance with focus on technique and hand placement.  Once patient pushes to stand with both UEs, she reaches for the walker with right hand first.  She wanted to practice this method 8-10 times then became fatigued and declined to perform any more standing.  Reviewed a stand/squat pivot if patient was fatigued and needed to transfer without use of the RW.   Therapy Documentation Precautions:  Precautions Precautions: Fall Precaution Comments: Limb guard Rt. LE  Restrictions Weight Bearing Restrictions: Yes Pain: 6/10 pain in RLE, RN aware, repositioned and limited amount of standing in the session.  See FIM for current functional status  Therapy/Group: Individual Therapy  Briani Maul 04/27/2012, 12:12 PM

## 2012-04-27 NOTE — Plan of Care (Signed)
Hypoglycemic Event  CBG:64  Treatment: 15 GM carbohydrate snack, ate crackers  Symptoms: Sweaty  Follow-up CBG: Time:1518 CBG Result:76  Possible Reasons for Event: Unknown  Comments/MD notified: Harvel Ricks, PA aware   [name]  Remember to initiate Hypoglycemia Order Set & complete

## 2012-04-28 ENCOUNTER — Inpatient Hospital Stay (HOSPITAL_COMMUNITY): Payer: Medicare Other | Admitting: *Deleted

## 2012-04-28 ENCOUNTER — Inpatient Hospital Stay (HOSPITAL_COMMUNITY): Payer: Medicare Other | Admitting: Physical Therapy

## 2012-04-28 LAB — GLUCOSE, CAPILLARY
Glucose-Capillary: 125 mg/dL — ABNORMAL HIGH (ref 70–99)
Glucose-Capillary: 64 mg/dL — ABNORMAL LOW (ref 70–99)
Glucose-Capillary: 53 mg/dL — ABNORMAL LOW (ref 70–99)
Glucose-Capillary: 57 mg/dL — ABNORMAL LOW (ref 70–99)
Glucose-Capillary: 75 mg/dL (ref 70–99)
Glucose-Capillary: 163 mg/dL — ABNORMAL HIGH (ref 70–99)

## 2012-04-28 LAB — BASIC METABOLIC PANEL
Calcium: 8.8 mg/dL (ref 8.4–10.5)
GFR calc Af Amer: >90 mL/min (ref >90)
GFR calc non Af Amer: >90 mL/min (ref >90)
Potassium: 3.2 mEq/L — ABNORMAL LOW (ref 3.5–5.1)
Sodium: 135 mEq/L (ref 135–145)

## 2012-04-28 MED ORDER — PIOGLITAZONE HCL-METFORMIN HCL 15-500 MG PO TABS: 1.0000 | ORAL_TABLET | Freq: Every day | ORAL | Status: DC

## 2012-04-28 MED ORDER — SENNOSIDES-DOCUSATE SODIUM 8.6-50 MG PO TABS
2.0000 | ORAL_TABLET | Freq: Two times a day (BID) | ORAL | Status: DC
  Administered 2012-04-28 – 2012-05-08 (×5): 2 via ORAL
  Filled 2012-04-28 (×4): qty 2
  Filled 2012-04-28: qty 1
  Filled 2012-04-28: qty 2

## 2012-04-28 MED ORDER — PIOGLITAZONE HCL 15 MG PO TABS
15.0000 mg | ORAL_TABLET | Freq: Every day | ORAL | Status: DC
  Administered 2012-04-29 – 2012-05-01 (×3): 15 mg via ORAL
  Filled 2012-04-28 (×5): qty 1

## 2012-04-28 MED ORDER — METFORMIN HCL 500 MG PO TABS
500.0000 mg | ORAL_TABLET | Freq: Every day | ORAL | Status: DC
  Administered 2012-04-29 – 2012-05-02 (×4): 500 mg via ORAL
  Filled 2012-04-28 (×5): qty 1

## 2012-04-28 MED ORDER — RAMIPRIL 2.5 MG PO CAPS
2.5000 mg | ORAL_CAPSULE | Freq: Two times a day (BID) | ORAL | Status: DC
  Administered 2012-04-28: 1.25 mg via ORAL
  Filled 2012-04-28: qty 1

## 2012-04-28 MED ORDER — GLUCOSE 40 % PO GEL
ORAL | Status: AC
  Filled 2012-04-28: qty 1

## 2012-04-28 MED ORDER — POTASSIUM CHLORIDE ER 10 MEQ PO TBCR
20.0000 meq | EXTENDED_RELEASE_TABLET | Freq: Three times a day (TID) | ORAL | Status: DC
  Administered 2012-04-28 – 2012-05-08 (×30): 20 meq via ORAL
  Filled 2012-04-28 (×34): qty 2

## 2012-04-28 NOTE — Progress Notes (Signed)
Physical Therapy Session Note  Patient Details  Name: [name] MRN: [id number] Date of Birth: [birth year]  Today's Date: 04/28/2012 Time: 1430-1500 Time Calculation (min): 30 min  Short Term Goals: Week 1:  PT Short Term Goal 1 (Week 1): Pt will perform sit <> stand with supervision.  PT Short Term Goal 2 (Week 1): Pt will perform bed <> chair transfer with min assist.  PT Short Term Goal 3 (Week 1): Pt will perform 15' ambulation with moderate assist PT Short Term Goal 4 (Week 1): Pt will verbalize complience with developing HEP outside of therapy session.  Therapy Documentation Precautions:  Precautions Precautions: Fall Precaution Comments: Limb guard Rt. LE  Restrictions Weight Bearing Restrictions: Yes General: Blood sugars low at 57 prior to PT session with nursing notified and given orange juices, peanut butter and graham crackers Pain: Pain Assessment Pain Assessment: No/denies pain Pain Score: 0-No pain  Therapeutic Exercise:(15')  R LE quad sets and SLR's Wheelchair Management:(15') turning w/c around inside room. S/Independent for propulsion/turning.  Therapy/Group: Individual Therapy  Rex Kras 04/28/2012, 2:49 PM

## 2012-04-28 NOTE — Progress Notes (Signed)
Physical Therapy Session Note  Patient Details  Name: [name] MRN: [id number] Date of Birth: 04/13/1946  Today's Date: 04/28/2012 Time: 0730-0830 Time Calculation (min): 60 min  Short Term Goals: Week 1:  PT Short Term Goal 1 (Week 1): Pt will perform sit <> stand with supervision.  PT Short Term Goal 2 (Week 1): Pt will perform bed <> chair transfer with min assist.  PT Short Term Goal 3 (Week 1): Pt will perform 15' ambulation with moderate assist PT Short Term Goal 4 (Week 1): Pt will verbalize complience with developing HEP outside of therapy session.  Therapy Documentation Precautions:  Precautions Precautions: Fall Precaution Comments: Limb guard Rt. LE  Restrictions Weight Bearing Restrictions: Yes  Pain: 3/10 R LE  Therapeutic Activity x 45' Bed Mobility S/Mod-I with patient using handrail to sit to EOB, Transfer bed<->BSC S/min-A for stand-pivot transfer.                                                   Therapeutic Exercise x 15'  Supine and sitting B LE exercises against gravitiy   See FIM for current functional status  Therapy/Group: Individual Therapy  Rex Kras 04/28/2012, 7:44 AM

## 2012-04-28 NOTE — Progress Notes (Signed)
Occupational Therapy Session Note  Patient Details  Name: Kamillah Devivo MRN: [id number] Date of Birth: 18-Nov-1945  Today's Date: 04/28/2012 Time:1030-1115 Time Calculation (min):45 min  1st session             1415-1500  (45 min)   2nd session  Short Term Goals: Week 1:  OT Short Term Goal 1 (Week 1): Pt will transfer to toilet and or BSC with RW with supervision. OT Short Term Goal 2 (Week 1): Pt will dress LB with supervision. OT Short Term Goal 3 (Week 1): Pt will bathe with supervision.  Skilled Therapeutic Interventions/Progress Updates:    1st session:   Pt transferred from bed to wc with min assist.  Used wc to go to bathroom.  Transferred from wc to toilet and back with min assist for transfer and min assist with clothes.  Pt. Sponged at sink.  Fearful of standing to wash periarea but did with min assist for balance.  Pt tried lateral leaning to get clothes up to waist but unable.  Addressed  sit to stand with min assist and max assist for clothes and posterior pericare.       2nd session:  Engaged in standing tolerance at sink for grooming.  Stood4 times for 45 sec, 1 min.,  50 sec., .with min assist for balance and using edge of sink as trunk support.  Stood without hands while doing activities.  Engaged in wc propulsion from room to gym.  Performed Upper extremity exercises using 2 # weights.  Pt. Sweating a lot.  Had blood  Sugar checked.  Reading was 64.  Apple juice given.   Therapy Documentation Precautions:  Precautions Precautions: Fall Precaution Comments: Limb guard Rt. LE  Restrictions Weight Bearing Restrictions: Yes Pain:  5/10  1st session            0/10  2nd session        See FIM for current functional status  Therapy/Group: Individual Therapy  Humberto Seals 04/28/2012, 10:59 AM

## 2012-04-28 NOTE — Progress Notes (Signed)
Patient ID: [name], female   DOB: 1946/06/24, 66 y.o.   MRN: [id number] Renise Ribble is a 66 y.o. right-handed female with history of diabetes mellitus with peripheral neuropathy, coronary artery disease with PTCA, peripheral vascular disease with multiple revascularization procedures. Patient recently discharged 04/09/2012 after aortobifemoral bypass as well as thrombectomy of right femoral to below knee popliteal Gore-Tex graft extension and umbilical hernia repair 03/26/2012 per Dr. Arbie Cookey with hospital course complicated by respiratory failure which required intubation as well as flash pulmonary edema with elevated troponin and NSTEMI with cardiac catheterization completed showing patency of left circumflex stent. Patient admitted 04/23/2012 and presented with progressive ischemic changes of the right foot as well as increased rest pain. 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 Arbie Cookey. A limb guard was provided by [employer]  Subjective/Complaints: Wants to use home meds (brand name vs generics) Review of Systems  Musculoskeletal: Positive for joint pain.  Psychiatric/Behavioral: The patient is nervous/anxious. The patient does not have insomnia.    Objective: Vital Signs: Blood pressure 166/49, pulse 92, temperature 97.8 F (36.6 C), temperature source Oral, resp. rate 19, weight 57.8 kg (127 lb 6.8 oz), SpO2 96.00%. No results found. Results for orders placed during the hospital encounter of 04/26/12 (from the past 72 hour(s))  CBC     Status: Abnormal   Collection Time   04/26/12  5:02 PM      Component Value Range Comment   WBC 10.0  4.0 - 10.5 K/uL    RBC 3.01 (*) 3.87 - 5.11 MIL/uL    Hemoglobin 8.0 (*) 12.0 - 15.0 g/dL    HCT 78.2 (*) 95.6 - 46.0 %    MCV 81.7  78.0 - 100.0 fL    MCH 26.6  26.0 - 34.0 pg    MCHC 32.5  30.0 - 36.0 g/dL    RDW 21.3 (*) 08.6 - 15.5 %    Platelets 370  150 - 400 K/uL     CREATININE, SERUM     Status: Normal   Collection Time   04/26/12  5:02 PM      Component Value Range Comment   Creatinine, Ser 0.53  0.50 - 1.10 mg/dL    GFR calc non Af Amer >90  >90 mL/min    GFR calc Af Amer >90  >90 mL/min   GLUCOSE, CAPILLARY     Status: Abnormal   Collection Time   04/26/12  5:53 PM      Component Value Range Comment   Glucose-Capillary 145 (*) 70 - 99 mg/dL    Comment 1 Notify RN     GLUCOSE, CAPILLARY     Status: Abnormal   Collection Time   04/26/12  9:00 PM      Component Value Range Comment   Glucose-Capillary 150 (*) 70 - 99 mg/dL    Comment 1 Notify RN     CBC WITH DIFFERENTIAL     Status: Abnormal   Collection Time   04/27/12  5:00 AM      Component Value Range Comment   WBC 8.0  4.0 - 10.5 K/uL    RBC 3.08 (*) 3.87 - 5.11 MIL/uL    Hemoglobin 8.1 (*) 12.0 - 15.0 g/dL    HCT 57.8 (*) 46.9 - 46.0 %    MCV 81.8  78.0 - 100.0 fL    MCH 26.3  26.0 - 34.0 pg    MCHC 32.1  30.0 - 36.0  g/dL    RDW 30.8 (*) 65.7 - 15.5 %    Platelets 380  150 - 400 K/uL    Neutrophils Relative 77  43 - 77 %    Neutro Abs 6.1  1.7 - 7.7 K/uL    Lymphocytes Relative 12  12 - 46 %    Lymphs Abs 1.0  0.7 - 4.0 K/uL    Monocytes Relative 7  3 - 12 %    Monocytes Absolute 0.6  0.1 - 1.0 K/uL    Eosinophils Relative 4  0 - 5 %    Eosinophils Absolute 0.3  0.0 - 0.7 K/uL    Basophils Relative 0  0 - 1 %    Basophils Absolute 0.0  0.0 - 0.1 K/uL   COMPREHENSIVE METABOLIC PANEL     Status: Abnormal   Collection Time   04/27/12  5:00 AM      Component Value Range Comment   Sodium 136  135 - 145 mEq/L    Potassium 2.9 (*) 3.5 - 5.1 mEq/L    Chloride 96  96 - 112 mEq/L    CO2 29  19 - 32 mEq/L    Glucose, Bld 99  70 - 99 mg/dL    BUN 6  6 - 23 mg/dL    Creatinine, Ser 8.46 (*) 0.50 - 1.10 mg/dL    Calcium 8.6  8.4 - 96.2 mg/dL    Total Protein 5.6 (*) 6.0 - 8.3 g/dL    Albumin 2.1 (*) 3.5 - 5.2 g/dL    AST 23  0 - 37 U/L    ALT 15  0 - 35 U/L    Alkaline  Phosphatase 73  39 - 117 U/L    Total Bilirubin 0.2 (*) 0.3 - 1.2 mg/dL    GFR calc non Af Amer >90  >90 mL/min    GFR calc Af Amer >90  >90 mL/min   GLUCOSE, CAPILLARY     Status: Abnormal   Collection Time   04/27/12  7:15 AM      Component Value Range Comment   Glucose-Capillary 105 (*) 70 - 99 mg/dL    Comment 1 Notify RN     GLUCOSE, CAPILLARY     Status: Abnormal   Collection Time   04/27/12 11:11 AM      Component Value Range Comment   Glucose-Capillary 204 (*) 70 - 99 mg/dL    Comment 1 Notify RN     GLUCOSE, CAPILLARY     Status: Abnormal   Collection Time   04/27/12  2:35 PM      Component Value Range Comment   Glucose-Capillary 48 (*) 70 - 99 mg/dL   GLUCOSE, CAPILLARY     Status: Abnormal   Collection Time   04/27/12  3:01 PM      Component Value Range Comment   Glucose-Capillary 64 (*) 70 - 99 mg/dL   GLUCOSE, CAPILLARY     Status: Normal   Collection Time   04/27/12  3:18 PM      Component Value Range Comment   Glucose-Capillary 76  70 - 99 mg/dL    Comment 1 Notify RN     GLUCOSE, CAPILLARY     Status: Abnormal   Collection Time   04/27/12  4:36 PM      Component Value Range Comment   Glucose-Capillary 156 (*) 70 - 99 mg/dL    Comment 1 Notify RN     GLUCOSE, CAPILLARY  Status: Abnormal   Collection Time   04/27/12  9:03 PM      Component Value Range Comment   Glucose-Capillary 149 (*) 70 - 99 mg/dL    Comment 1 Notify RN     BASIC METABOLIC PANEL     Status: Abnormal   Collection Time   04/28/12  5:00 AM      Component Value Range Comment   Sodium 135  135 - 145 mEq/L    Potassium 3.2 (*) 3.5 - 5.1 mEq/L    Chloride 97  96 - 112 mEq/L    CO2 30  19 - 32 mEq/L    Glucose, Bld 95  70 - 99 mg/dL    BUN 7  6 - 23 mg/dL    Creatinine, Ser 1.61 (*) 0.50 - 1.10 mg/dL    Calcium 8.8  8.4 - 09.6 mg/dL    GFR calc non Af Amer >90  >90 mL/min    GFR calc Af Amer >90  >90 mL/min     HEENT: normal Cardio: RRR Resp: CTA B/L GI: BS  positive Extremity:  No Edema Skin:   Other R BKA  ACE  Neuro: Alert/Oriented Musc/Skel:  Other Able to flex R hip , abd and add   Assessment/Plan: 1. Functional deficits secondary to R BKA due to PVD associated with DM which require 3+ hours per day of interdisciplinary therapy in a comprehensive inpatient rehab setting. Physiatrist is providing close team supervision and 24 hour management of active medical problems listed below. Physiatrist and rehab team continue to assess barriers to discharge/monitor patient progress toward functional and medical goals. FIM: FIM - Bathing Bathing Steps Patient Completed: Chest;Right Arm;Left Arm;Abdomen;Front perineal area;Right upper leg;Left upper leg (7 out of 9 body parts) Bathing: 4: Min-Patient completes 8-9 30f 10 parts or 75+ percent  FIM - Upper Body Dressing/Undressing Upper body dressing/undressing steps patient completed: Thread/unthread right bra strap;Thread/unthread left bra strap;Hook/unhook bra;Thread/unthread right sleeve of pullover shirt/dresss;Thread/unthread left sleeve of pullover shirt/dress;Put head through opening of pull over shirt/dress;Pull shirt over trunk Upper body dressing/undressing: 5: Set-up assist to: Obtain clothing/put away FIM - Lower Body Dressing/Undressing Lower body dressing/undressing steps patient completed: Thread/unthread right pants leg;Thread/unthread left pants leg;Pull pants up/down (assist to don sock) Lower body dressing/undressing: 3: Mod-Patient completed 50-74% of tasks  FIM - Toileting Toileting steps completed by patient: Adjust clothing prior to toileting;Performs perineal hygiene;Adjust clothing after toileting Toileting: 4: Steadying assist  FIM - Diplomatic Services operational officer Devices: Bedside commode;Grab bars (BSC over toilet) Toilet Transfers: 4-To toilet/BSC: Min A (steadying Pt. > 75%);4-From toilet/BSC: Min A (steadying Pt. > 75%)  FIM - Bed/Chair Transfer Bed/Chair  Transfer Assistive Devices: Therapist, occupational: 4: Sit > Supine: Min A (steadying pt. > 75%/lift 1 leg);3: Chair or W/C > Bed: Mod A (lift or lower assist);3: Bed > Chair or W/C: Mod A (lift or lower assist);4: Supine > Sit: Min A (steadying Pt. > 75%/lift 1 leg)  FIM - Locomotion: Wheelchair Locomotion: Wheelchair: 1: Travels less than 50 ft with minimal assistance (Pt.>75%) FIM - Locomotion: Ambulation Locomotion: Ambulation Assistive Devices: Designer, industrial/product Ambulation/Gait Assistance: 1: +2 Total assist (pt= 70%) Locomotion: Ambulation: 1: Two helpers  Comprehension Comprehension Mode: Auditory Comprehension: 5-Understands basic 90% of the time/requires cueing < 10% of the time  Expression Expression Mode: Verbal Expression: 5-Expresses basic needs/ideas: With extra time/assistive device  Social Interaction Social Interaction: 5-Interacts appropriately 90% of the time - Needs monitoring or encouragement for participation or  interaction.  Problem Solving Problem Solving: 5-Solves basic 90% of the time/requires cueing < 10% of the time  Memory Memory: 4-Recognizes or recalls 75 - 89% of the time/requires cueing 10 - 24% of the time  Medical Problem List and Plan:  1. 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 DVT Prophylaxis/Anticoagulation: Subcutaneous Lovenox. Monitor platelet counts and any signs of bleeding  3. Pain Management: Demerol 50 mg every 4 hours as needed. Monitor mental status closely and any signs of increased nausea  4. Mood: Xanax 0.25 mg 3 times daily as needed. Provide emotional support and positive reinforcement  5. Neuropsych: This patient is capable of making decisions on his/her own behalf.  6.Postoperative anemia .Niferex twice a day. Followup CBC  7. Coronary artery disease with PTCA . continue aspirin Plavix therapy. Patient denies any chest pain or  shortness of breath  8. Diabetes mellitus with peripheral neuropathy. Latest hemoglobin A1c of 6.3 .D/C Glyburide 2.5 mg daily due to hypoglycemia yesterday, trandjenta 5 mg each bedtime . Check blood sugars a.c. and at bedtime  9. Diastolic congestive heart failure. Lasix 40 mg daily. Monitor for any signs of fluid overload  10. Hypertension . imdur 60 mg daily, Altace 2.5 mg twice a day, Lopressor 50 mg twice a day  11. Constipation. Adjust bowel program as needed add senna    LOS (Days) 2 A FACE TO FACE EVALUATION WAS PERFORMED  Marsella Suman E 04/28/2012, 8:08 AM

## 2012-04-28 NOTE — Plan of Care (Signed)
Problem: RH BOWEL ELIMINATION Goal: RH STG MANAGE BOWEL WITH ASSISTANCE STG Manage Bowel with min Assistance.  Outcome: Not Progressing LBM 04-24-12 Goal: RH STG MANAGE BOWEL W/MEDICATION W/ASSISTANCE STG Manage Bowel with Medication with min Assistance.  Outcome: Not Progressing LBM 04-24-12

## 2012-04-29 ENCOUNTER — Inpatient Hospital Stay (HOSPITAL_COMMUNITY): Payer: Medicare Other

## 2012-04-29 LAB — GLUCOSE, CAPILLARY
Glucose-Capillary: 244 mg/dL — ABNORMAL HIGH (ref 70–99)
Glucose-Capillary: 79 mg/dL (ref 70–99)

## 2012-04-29 MED ORDER — ALTEPLASE 2 MG IJ SOLR
2.0000 mg | Freq: Once | INTRAMUSCULAR | Status: AC
  Administered 2012-04-29: 2 mg
  Filled 2012-04-29: qty 2

## 2012-04-29 MED ORDER — RAMIPRIL 1.25 MG PO CAPS
1.2500 mg | ORAL_CAPSULE | Freq: Two times a day (BID) | ORAL | Status: DC
  Administered 2012-04-29 – 2012-05-08 (×17): 1.25 mg via ORAL
  Filled 2012-04-29 (×15): qty 1

## 2012-04-29 NOTE — Progress Notes (Signed)
Occupational Therapy Session Note  Patient Details  Name: [name] MRN: [id number] Date of Birth: 1945-08-20  Today's Date: 04/29/2012 Time: 1300-1330 Time Calculation (min): 30 min  Short Term Goals: Week 1:  OT Short Term Goal 1 (Week 1): Pt will transfer to toilet and or BSC with RW with supervision. OT Short Term Goal 2 (Week 1): Pt will dress LB with supervision. OT Short Term Goal 3 (Week 1): Pt will bathe with supervision.  Skilled Therapeutic Interventions:  Therapeutic activities with emphasis on functional transfers using RW, lower body dressing using AE, prn.   Patient reported poor left leg flexibility limits her ability to don socks and shoes in prep for mobility; OT demo'd use of reacher and sock aid.  Patient able to use sock aid successfully to don socks but struggled with use of reacher to doff socks.  Advised use of dressing stick.   Patient's husband was present for session on use of sock aid and he confirmed that he could assist with lower body dressing but preferred her wife to be more independent, if possible.  During transfers from w/c to bed, patient requires cues to slow down, move in shorter distances, and employ occasional "hops" to readjust and remain within base of support provided by RW.    Therapy Documentation Precautions:  Precautions Precautions: Fall Precaution Comments: Limb guard Rt. LE  Restrictions Weight Bearing Restrictions: Yes  Pain: 2/10 at right LE.  See FIM for current functional status  Therapy/Group: Individual Therapy  Georgeanne Nim 04/29/2012, 3:41 PM

## 2012-04-29 NOTE — Progress Notes (Signed)
Patient ID: [name], female   DOB: 05-11-1946, 66 y.o.   MRN: [id number] [name] is a 66 y.o. right-handed female with history of diabetes mellitus with peripheral neuropathy, coronary artery disease with PTCA, peripheral vascular disease with multiple revascularization procedures. Patient recently discharged 04/09/2012 after aortobifemoral bypass as well as thrombectomy of right femoral to below knee popliteal Gore-Tex graft extension and umbilical hernia repair 03/26/2012 per Dr. Arbie Cookey with hospital course complicated by respiratory failure which required intubation as well as flash pulmonary edema with elevated troponin and NSTEMI with cardiac catheterization completed showing patency of left circumflex stent. Patient admitted 04/23/2012 and presented with progressive ischemic changes of the right foot as well as increased rest pain. 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 Arbie Cookey. A limb guard was provided by [employer]  CBG low yesterday pm, was getting BID Metformin actos combo but takes only one at home Glipizide d/c ed yesterday Subjective/Complaints: Wants to use home meds (brand name vs generics) Review of Systems  Musculoskeletal: Positive for joint pain.  Psychiatric/Behavioral: The patient is nervous/anxious. The patient does not have insomnia.    Objective: Vital Signs: Blood pressure 152/39, pulse 91, temperature 97.8 F (36.6 C), temperature source Oral, resp. rate 17, weight 57.8 kg (127 lb 6.8 oz), SpO2 95.00%. No results found. Results for orders placed during the hospital encounter of 04/26/12 (from the past 72 hour(s))  CBC     Status: Abnormal   Collection Time   04/26/12  5:02 PM      Component Value Range Comment   WBC 10.0  4.0 - 10.5 K/uL    RBC 3.01 (*) 3.87 - 5.11 MIL/uL    Hemoglobin 8.0 (*) 12.0 - 15.0 g/dL    HCT 40.9 (*) 81.1 - 46.0 %    MCV 81.7  78.0 - 100.0 fL    MCH 26.6  26.0 -  34.0 pg    MCHC 32.5  30.0 - 36.0 g/dL    RDW 91.4 (*) 78.2 - 15.5 %    Platelets 370  150 - 400 K/uL   CREATININE, SERUM     Status: Normal   Collection Time   04/26/12  5:02 PM      Component Value Range Comment   Creatinine, Ser 0.53  0.50 - 1.10 mg/dL    GFR calc non Af Amer >90  >90 mL/min    GFR calc Af Amer >90  >90 mL/min   GLUCOSE, CAPILLARY     Status: Abnormal   Collection Time   04/26/12  5:53 PM      Component Value Range Comment   Glucose-Capillary 145 (*) 70 - 99 mg/dL    Comment 1 Notify RN     GLUCOSE, CAPILLARY     Status: Abnormal   Collection Time   04/26/12  9:00 PM      Component Value Range Comment   Glucose-Capillary 150 (*) 70 - 99 mg/dL    Comment 1 Notify RN     CBC WITH DIFFERENTIAL     Status: Abnormal   Collection Time   04/27/12  5:00 AM      Component Value Range Comment   WBC 8.0  4.0 - 10.5 K/uL    RBC 3.08 (*) 3.87 - 5.11 MIL/uL    Hemoglobin 8.1 (*) 12.0 - 15.0 g/dL    HCT 95.6 (*) 21.3 - 46.0 %    MCV 81.8  78.0 - 100.0 fL  MCH 26.3  26.0 - 34.0 pg    MCHC 32.1  30.0 - 36.0 g/dL    RDW 16.1 (*) 09.6 - 15.5 %    Platelets 380  150 - 400 K/uL    Neutrophils Relative 77  43 - 77 %    Neutro Abs 6.1  1.7 - 7.7 K/uL    Lymphocytes Relative 12  12 - 46 %    Lymphs Abs 1.0  0.7 - 4.0 K/uL    Monocytes Relative 7  3 - 12 %    Monocytes Absolute 0.6  0.1 - 1.0 K/uL    Eosinophils Relative 4  0 - 5 %    Eosinophils Absolute 0.3  0.0 - 0.7 K/uL    Basophils Relative 0  0 - 1 %    Basophils Absolute 0.0  0.0 - 0.1 K/uL   COMPREHENSIVE METABOLIC PANEL     Status: Abnormal   Collection Time   04/27/12  5:00 AM      Component Value Range Comment   Sodium 136  135 - 145 mEq/L    Potassium 2.9 (*) 3.5 - 5.1 mEq/L    Chloride 96  96 - 112 mEq/L    CO2 29  19 - 32 mEq/L    Glucose, Bld 99  70 - 99 mg/dL    BUN 6  6 - 23 mg/dL    Creatinine, Ser 0.45 (*) 0.50 - 1.10 mg/dL    Calcium 8.6  8.4 - 40.9 mg/dL    Total Protein 5.6 (*) 6.0 - 8.3  g/dL    Albumin 2.1 (*) 3.5 - 5.2 g/dL    AST 23  0 - 37 U/L    ALT 15  0 - 35 U/L    Alkaline Phosphatase 73  39 - 117 U/L    Total Bilirubin 0.2 (*) 0.3 - 1.2 mg/dL    GFR calc non Af Amer >90  >90 mL/min    GFR calc Af Amer >90  >90 mL/min   GLUCOSE, CAPILLARY     Status: Abnormal   Collection Time   04/27/12  7:15 AM      Component Value Range Comment   Glucose-Capillary 105 (*) 70 - 99 mg/dL    Comment 1 Notify RN     GLUCOSE, CAPILLARY     Status: Abnormal   Collection Time   04/27/12 11:11 AM      Component Value Range Comment   Glucose-Capillary 204 (*) 70 - 99 mg/dL    Comment 1 Notify RN     GLUCOSE, CAPILLARY     Status: Abnormal   Collection Time   04/27/12  2:35 PM      Component Value Range Comment   Glucose-Capillary 48 (*) 70 - 99 mg/dL   GLUCOSE, CAPILLARY     Status: Abnormal   Collection Time   04/27/12  3:01 PM      Component Value Range Comment   Glucose-Capillary 64 (*) 70 - 99 mg/dL   GLUCOSE, CAPILLARY     Status: Normal   Collection Time   04/27/12  3:18 PM      Component Value Range Comment   Glucose-Capillary 76  70 - 99 mg/dL    Comment 1 Notify RN     GLUCOSE, CAPILLARY     Status: Abnormal   Collection Time   04/27/12  4:36 PM      Component Value Range Comment   Glucose-Capillary 156 (*) 70 - 99 mg/dL  Comment 1 Notify RN     GLUCOSE, CAPILLARY     Status: Abnormal   Collection Time   04/27/12  9:03 PM      Component Value Range Comment   Glucose-Capillary 149 (*) 70 - 99 mg/dL    Comment 1 Notify RN     BASIC METABOLIC PANEL     Status: Abnormal   Collection Time   04/28/12  5:00 AM      Component Value Range Comment   Sodium 135  135 - 145 mEq/L    Potassium 3.2 (*) 3.5 - 5.1 mEq/L    Chloride 97  96 - 112 mEq/L    CO2 30  19 - 32 mEq/L    Glucose, Bld 95  70 - 99 mg/dL    BUN 7  6 - 23 mg/dL    Creatinine, Ser 1.47 (*) 0.50 - 1.10 mg/dL    Calcium 8.8  8.4 - 82.9 mg/dL    GFR calc non Af Amer >90  >90 mL/min    GFR calc  Af Amer >90  >90 mL/min   GLUCOSE, CAPILLARY     Status: Abnormal   Collection Time   04/28/12  7:48 AM      Component Value Range Comment   Glucose-Capillary 125 (*) 70 - 99 mg/dL    Comment 1 Notify RN     GLUCOSE, CAPILLARY     Status: Abnormal   Collection Time   04/28/12 11:43 AM      Component Value Range Comment   Glucose-Capillary 205 (*) 70 - 99 mg/dL    Comment 1 Notify RN     GLUCOSE, CAPILLARY     Status: Abnormal   Collection Time   04/28/12  2:12 PM      Component Value Range Comment   Glucose-Capillary 64 (*) 70 - 99 mg/dL    Comment 1 Notify RN     GLUCOSE, CAPILLARY     Status: Abnormal   Collection Time   04/28/12  2:40 PM      Component Value Range Comment   Glucose-Capillary 57 (*) 70 - 99 mg/dL   GLUCOSE, CAPILLARY     Status: Abnormal   Collection Time   04/28/12  3:10 PM      Component Value Range Comment   Glucose-Capillary 53 (*) 70 - 99 mg/dL    Comment 1 Notify RN     GLUCOSE, CAPILLARY     Status: Normal   Collection Time   04/28/12  3:36 PM      Component Value Range Comment   Glucose-Capillary 75  70 - 99 mg/dL    Comment 1 Notify RN     GLUCOSE, CAPILLARY     Status: Abnormal   Collection Time   04/28/12  5:00 PM      Component Value Range Comment   Glucose-Capillary 137 (*) 70 - 99 mg/dL    Comment 1 Notify RN     GLUCOSE, CAPILLARY     Status: Abnormal   Collection Time   04/28/12  8:54 PM      Component Value Range Comment   Glucose-Capillary 163 (*) 70 - 99 mg/dL    Comment 1 Notify RN     GLUCOSE, CAPILLARY     Status: Abnormal   Collection Time   04/29/12  7:27 AM      Component Value Range Comment   Glucose-Capillary 108 (*) 70 - 99 mg/dL    Comment 1 Notify RN  HEENT: normal Cardio: RRR Resp: CTA B/L GI: BS positive Extremity:  No Edema Skin:   Other R BKA  ACE  Neuro: Alert/Oriented Musc/Skel:  Other Able to flex R hip , abd and add Gen ;NAD  Assessment/Plan: 1. Functional deficits secondary to R BKA due to  PVD associated with DM which require 3+ hours per day of interdisciplinary therapy in a comprehensive inpatient rehab setting. Physiatrist is providing close team supervision and 24 hour management of active medical problems listed below. Physiatrist and rehab team continue to assess barriers to discharge/monitor patient progress toward functional and medical goals. FIM: FIM - Bathing Bathing Steps Patient Completed: Chest;Right Arm;Left Arm;Abdomen;Front perineal area;Right upper leg;Left upper leg;Left lower leg (including foot) Bathing: 4: Steadying assist  FIM - Upper Body Dressing/Undressing Upper body dressing/undressing steps patient completed: Thread/unthread right sleeve of pullover shirt/dresss;Thread/unthread left sleeve of pullover shirt/dress;Put head through opening of pull over shirt/dress;Pull shirt over trunk Upper body dressing/undressing: 5: Set-up assist to: Obtain clothing/put away FIM - Lower Body Dressing/Undressing Lower body dressing/undressing steps patient completed: Thread/unthread right underwear leg;Thread/unthread left underwear leg;Pull underwear up/down;Thread/unthread right pants leg;Thread/unthread left pants leg;Don/Doff left sock Lower body dressing/undressing: 4: Min-Patient completed 75 plus % of tasks  FIM - Toileting Toileting steps completed by patient: Performs perineal hygiene Toileting: 2: Max-Patient completed 1 of 3 steps  FIM - Diplomatic Services operational officer Devices: Bedside commode;Grab bars (BSC over toilet) Toilet Transfers: 4-To toilet/BSC: Min A (steadying Pt. > 75%);4-From toilet/BSC: Min A (steadying Pt. > 75%)  FIM - Bed/Chair Transfer Bed/Chair Transfer Assistive Devices: Therapist, occupational: 4: Sit > Supine: Min A (steadying pt. > 75%/lift 1 leg);3: Chair or W/C > Bed: Mod A (lift or lower assist);3: Bed > Chair or W/C: Mod A (lift or lower assist);4: Supine > Sit: Min A (steadying Pt. > 75%/lift 1 leg)  FIM -  Locomotion: Wheelchair Locomotion: Wheelchair: 1: Travels less than 50 ft with minimal assistance (Pt.>75%) FIM - Locomotion: Ambulation Locomotion: Ambulation Assistive Devices: Designer, industrial/product Ambulation/Gait Assistance: 1: +2 Total assist (pt= 70%) Locomotion: Ambulation: 1: Two helpers  Comprehension Comprehension Mode: Auditory Comprehension: 5-Understands basic 90% of the time/requires cueing < 10% of the time  Expression Expression Mode: Verbal Expression: 5-Expresses basic needs/ideas: With extra time/assistive device  Social Interaction Social Interaction: 5-Interacts appropriately 90% of the time - Needs monitoring or encouragement for participation or interaction.  Problem Solving Problem Solving: 5-Solves basic 90% of the time/requires cueing < 10% of the time  Memory Memory: 4-Recognizes or recalls 75 - 89% of the time/requires cueing 10 - 24% of the time  Medical Problem List and Plan:  1. 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 DVT Prophylaxis/Anticoagulation: Subcutaneous Lovenox. Monitor platelet counts and any signs of bleeding  3. Pain Management: Demerol 50 mg every 4 hours as needed. Monitor mental status closely and any signs of increased nausea  4. Mood: Xanax 0.25 mg 3 times daily as needed. Provide emotional support and positive reinforcement  5. Neuropsych: This patient is capable of making decisions on his/her own behalf.  6.Postoperative anemia .Niferex twice a day. Followup CBC  7. Coronary artery disease with PTCA . continue aspirin Plavix therapy. Patient denies any chest pain or shortness of breath  8. Diabetes mellitus with peripheral neuropathy. Latest hemoglobin A1c of 6.3 .D/C Glyburide 2.5 mg daily due to hypoglycemia , trandjenta 5 mg each bedtime .Metformin/actos to qd Check blood  sugars a.c. and at bedtime  9. Diastolic congestive heart  failure. Lasix 40 mg daily. Monitor for any signs of fluid overload  10. Hypertension . imdur 60 mg daily, Altace 2.5 mg twice a day, Lopressor 50 mg twice a day  11. Constipation. Adjust bowel program as needed add senna    LOS (Days) 3 A FACE TO FACE EVALUATION WAS PERFORMED  KIRSTEINS,ANDREW E 04/29/2012, 8:02 AM

## 2012-04-30 ENCOUNTER — Inpatient Hospital Stay (HOSPITAL_COMMUNITY): Payer: Medicare Other | Admitting: Occupational Therapy

## 2012-04-30 ENCOUNTER — Inpatient Hospital Stay (HOSPITAL_COMMUNITY): Payer: Medicare Other | Admitting: Physical Therapy

## 2012-04-30 DIAGNOSIS — I739 Peripheral vascular disease, unspecified: Secondary | ICD-10-CM

## 2012-04-30 DIAGNOSIS — S78119A Complete traumatic amputation at level between unspecified hip and knee, initial encounter: Secondary | ICD-10-CM

## 2012-04-30 DIAGNOSIS — L98499 Non-pressure chronic ulcer of skin of other sites with unspecified severity: Secondary | ICD-10-CM

## 2012-04-30 LAB — GLUCOSE, CAPILLARY: Glucose-Capillary: 214 mg/dL — ABNORMAL HIGH (ref 70–99)

## 2012-04-30 NOTE — Progress Notes (Signed)
Patient ID: [name], female   DOB: 1946-02-14, 66 y.o.   MRN: [id number] [name] is a 66 y.o. right-handed female with history of diabetes mellitus with peripheral neuropathy, coronary artery disease with PTCA, peripheral vascular disease with multiple revascularization procedures. Patient recently discharged 04/09/2012 after aortobifemoral bypass as well as thrombectomy of right femoral to below knee popliteal Gore-Tex graft extension and umbilical hernia repair 03/26/2012 per Dr. Arbie Cookey with hospital course complicated by respiratory failure which required intubation as well as flash pulmonary edema with elevated troponin and NSTEMI with cardiac catheterization completed showing patency of left circumflex stent. Patient admitted 04/23/2012 and presented with progressive ischemic changes of the right foot as well as increased rest pain. 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 Arbie Cookey. A limb guard was provided by [employer]  Subjective/Complaints: Wants to use home meds (brand name vs generics)--pain better controlled with switch to these Review of Systems  Musculoskeletal: Positive for joint pain.  Psychiatric/Behavioral: The patient is nervous/anxious. The patient does not have insomnia.    Objective: Vital Signs: Blood pressure 154/75, pulse 100, temperature 98.4 F (36.9 C), temperature source Oral, resp. rate 18, weight 57.8 kg (127 lb 6.8 oz), SpO2 98.00%. No results found. Results for orders placed during the hospital encounter of 04/26/12 (from the past 72 hour(s))  GLUCOSE, CAPILLARY     Status: Abnormal   Collection Time   04/27/12 11:11 AM      Component Value Range Comment   Glucose-Capillary 204 (*) 70 - 99 mg/dL    Comment 1 Notify RN     GLUCOSE, CAPILLARY     Status: Abnormal   Collection Time   04/27/12  2:35 PM      Component Value Range Comment   Glucose-Capillary 48 (*) 70 - 99 mg/dL   GLUCOSE,  CAPILLARY     Status: Abnormal   Collection Time   04/27/12  3:01 PM      Component Value Range Comment   Glucose-Capillary 64 (*) 70 - 99 mg/dL   GLUCOSE, CAPILLARY     Status: Normal   Collection Time   04/27/12  3:18 PM      Component Value Range Comment   Glucose-Capillary 76  70 - 99 mg/dL    Comment 1 Notify RN     GLUCOSE, CAPILLARY     Status: Abnormal   Collection Time   04/27/12  4:36 PM      Component Value Range Comment   Glucose-Capillary 156 (*) 70 - 99 mg/dL    Comment 1 Notify RN     GLUCOSE, CAPILLARY     Status: Abnormal   Collection Time   04/27/12  9:03 PM      Component Value Range Comment   Glucose-Capillary 149 (*) 70 - 99 mg/dL    Comment 1 Notify RN     BASIC METABOLIC PANEL     Status: Abnormal   Collection Time   04/28/12  5:00 AM      Component Value Range Comment   Sodium 135  135 - 145 mEq/L    Potassium 3.2 (*) 3.5 - 5.1 mEq/L    Chloride 97  96 - 112 mEq/L    CO2 30  19 - 32 mEq/L    Glucose, Bld 95  70 - 99 mg/dL    BUN 7  6 - 23 mg/dL    Creatinine, Ser 4.09 (*) 0.50 - 1.10 mg/dL  Calcium 8.8  8.4 - 10.5 mg/dL    GFR calc non Af Amer >90  >90 mL/min    GFR calc Af Amer >90  >90 mL/min   GLUCOSE, CAPILLARY     Status: Abnormal   Collection Time   04/28/12  7:48 AM      Component Value Range Comment   Glucose-Capillary 125 (*) 70 - 99 mg/dL    Comment 1 Notify RN     GLUCOSE, CAPILLARY     Status: Abnormal   Collection Time   04/28/12 11:43 AM      Component Value Range Comment   Glucose-Capillary 205 (*) 70 - 99 mg/dL    Comment 1 Notify RN     GLUCOSE, CAPILLARY     Status: Abnormal   Collection Time   04/28/12  2:12 PM      Component Value Range Comment   Glucose-Capillary 64 (*) 70 - 99 mg/dL    Comment 1 Notify RN     GLUCOSE, CAPILLARY     Status: Abnormal   Collection Time   04/28/12  2:40 PM      Component Value Range Comment   Glucose-Capillary 57 (*) 70 - 99 mg/dL   GLUCOSE, CAPILLARY     Status: Abnormal    Collection Time   04/28/12  3:10 PM      Component Value Range Comment   Glucose-Capillary 53 (*) 70 - 99 mg/dL    Comment 1 Notify RN     GLUCOSE, CAPILLARY     Status: Normal   Collection Time   04/28/12  3:36 PM      Component Value Range Comment   Glucose-Capillary 75  70 - 99 mg/dL    Comment 1 Notify RN     GLUCOSE, CAPILLARY     Status: Abnormal   Collection Time   04/28/12  5:00 PM      Component Value Range Comment   Glucose-Capillary 137 (*) 70 - 99 mg/dL    Comment 1 Notify RN     GLUCOSE, CAPILLARY     Status: Abnormal   Collection Time   04/28/12  8:54 PM      Component Value Range Comment   Glucose-Capillary 163 (*) 70 - 99 mg/dL    Comment 1 Notify RN     GLUCOSE, CAPILLARY     Status: Abnormal   Collection Time   04/29/12  7:27 AM      Component Value Range Comment   Glucose-Capillary 108 (*) 70 - 99 mg/dL    Comment 1 Notify RN     GLUCOSE, CAPILLARY     Status: Abnormal   Collection Time   04/29/12 11:34 AM      Component Value Range Comment   Glucose-Capillary 244 (*) 70 - 99 mg/dL    Comment 1 Notify RN     GLUCOSE, CAPILLARY     Status: Normal   Collection Time   04/29/12  4:19 PM      Component Value Range Comment   Glucose-Capillary 79  70 - 99 mg/dL    Comment 1 Notify RN     GLUCOSE, CAPILLARY     Status: Abnormal   Collection Time   04/29/12  8:54 PM      Component Value Range Comment   Glucose-Capillary 181 (*) 70 - 99 mg/dL    Comment 1 Notify RN     GLUCOSE, CAPILLARY     Status: Abnormal   Collection Time  04/30/12  7:17 AM      Component Value Range Comment   Glucose-Capillary 165 (*) 70 - 99 mg/dL    Comment 1 Notify RN       HEENT: normal Cardio: RRR Resp: CTA B/L GI: BS positive Extremity:  No Edema Skin:   Other R BKA  ACE  Neuro: Alert/Oriented Musc/Skel:  Other Able to flex R hip , abd and add. Left leg NVI. Right BK wound just dressed Gen ;NAD  Assessment/Plan: 1. Functional deficits secondary to R BKA due to PVD  associated with DM which require 3+ hours per day of interdisciplinary therapy in a comprehensive inpatient rehab setting. Physiatrist is providing close team supervision and 24 hour management of active medical problems listed below. Physiatrist and rehab team continue to assess barriers to discharge/monitor patient progress toward functional and medical goals. FIM: FIM - Bathing Bathing Steps Patient Completed: Chest;Right Arm;Left Arm;Abdomen;Front perineal area;Right upper leg;Left upper leg;Left lower leg (including foot) Bathing: 5: Set-up assist to: Open items  FIM - Upper Body Dressing/Undressing Upper body dressing/undressing steps patient completed: Thread/unthread right sleeve of pullover shirt/dresss;Thread/unthread left sleeve of pullover shirt/dress;Put head through opening of pull over shirt/dress;Pull shirt over trunk Upper body dressing/undressing: 5: Set-up assist to: Obtain clothing/put away FIM - Lower Body Dressing/Undressing Lower body dressing/undressing steps patient completed: Thread/unthread right underwear leg;Thread/unthread left underwear leg;Pull underwear up/down;Thread/unthread right pants leg;Thread/unthread left pants leg;Don/Doff left sock Lower body dressing/undressing:  (pt. wearing dress)  FIM - Toileting Toileting steps completed by patient: Adjust clothing prior to toileting;Adjust clothing after toileting;Performs perineal hygiene Toileting Assistive Devices: Grab bar or rail for support Toileting: 5: Supervision: Safety issues/verbal cues  FIM - Diplomatic Services operational officer Devices: Grab bars Toilet Transfers: 6-More than reasonable amt of time;5-From toilet/BSC: Supervision (verbal cues/safety issues)  FIM - Banker Devices: Therapist, occupational: 4: Bed > Chair or W/C: Min A (steadying Pt. > 75%);4: Chair or W/C > Bed: Min A (steadying Pt. > 75%)  FIM - Locomotion:  Wheelchair Locomotion: Wheelchair: 1: Travels less than 50 ft with minimal assistance (Pt.>75%) FIM - Locomotion: Ambulation Locomotion: Ambulation Assistive Devices: Designer, industrial/product Ambulation/Gait Assistance: 1: +2 Total assist (pt= 70%) Locomotion: Ambulation: 1: Two helpers  Comprehension Comprehension Mode: Auditory Comprehension: 5-Understands basic 90% of the time/requires cueing < 10% of the time  Expression Expression Mode: Verbal Expression: 5-Expresses basic needs/ideas: With extra time/assistive device  Social Interaction Social Interaction: 5-Interacts appropriately 90% of the time - Needs monitoring or encouragement for participation or interaction.  Problem Solving Problem Solving: 5-Solves basic 90% of the time/requires cueing < 10% of the time  Memory Memory: 4-Recognizes or recalls 75 - 89% of the time/requires cueing 10 - 24% of the time  Medical Problem List and Plan:  1. 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 DVT Prophylaxis/Anticoagulation: Subcutaneous Lovenox. Monitor platelet counts and any signs of bleeding  3. Pain Management: Demerol 50 mg every 4 hours as needed. Monitor mental status closely and any signs of increased nausea  4. Mood: Xanax 0.25 mg 3 times daily as needed. Provide emotional support and positive reinforcement  5. Neuropsych: This patient is capable of making decisions on his/her own behalf.  6.Postoperative anemia .Niferex twice a day. Followup CBC  7. Coronary artery disease with PTCA . continue aspirin Plavix therapy. Patient denies any chest  pain or shortness of breath  8. Diabetes mellitus with peripheral neuropathy. Latest hemoglobin A1c of 6.3 .D/C'ed Glyburide 2.5 mg daily due to hypoglycemia , trandjenta 5 mg each bedtime .Metformin/actos to qd Check blood sugars a.c. and at  bedtime-no newe changes until consistent pattern develops. 9. Diastolic congestive heart failure. Lasix 40 mg daily. Monitor for any signs of fluid overload, keep left leg elevated when possible. 10. Hypertension . imdur 60 mg daily, Altace 2.5 mg twice a day, Lopressor 50 mg twice a day  11. Constipation. Adjusted bowel program as needed add senna    LOS (Days) 4 A FACE TO FACE EVALUATION WAS PERFORMED  [name] 04/30/2012, 7:48 AM

## 2012-04-30 NOTE — Progress Notes (Signed)
Social Work  Social Work Assessment and Plan  Patient Details  Name: [name] MRN: [id number] Date of Birth: Apr 08, 1946  Today's Date: 04/30/2012  Problem List:  Patient Active Problem List  Diagnosis  . DIABETES MELLITUS  . HYPERLIPIDEMIA-MIXED  . HYPERTENSION, UNSPECIFIED  . CORONARY ATHEROSCLEROSIS NATIVE CORONARY ARTERY  . CAROTID OCCLUSIVE DISEASE  . UNSPECIFIED PERIPHERAL VASCULAR DISEASE  . Atherosclerosis of native arteries of the extremities with intermittent claudication  . Atherosclerosis of native arteries of the extremities with ulceration  . Chronic ulcer of unspecified site  . Cellulitis  . Atherosclerotic PVD with intermittent claudication  . Pain in limb  . Respiratory failure, post-operative  . Acute respiratory failure with hypoxia  . Pulmonary edema, acute  . Acute combined systolic and diastolic heart failure  . Acute myocardial infarction, subendocardial infarction, initial episode of care  . S/P BKA (below knee amputation)   Past Medical History:  Past Medical History  Diagnosis Date  . Hypertension     Unspecified  . Hyperlipidemia     Mixed  . Tobacco abuse     Remote  . Loose, teeth     has loose bridge and two loose teeth holding it  . PONV (postoperative nausea and vomiting)   . Type II diabetes mellitus   . GERD (gastroesophageal reflux disease)   . Anxiety   . Peripheral vascular disease     unspecified, a. s/p L CEA b. s/p B fem-pop bypass  . Raynaud disease   . Stones in the urinary tract   . UTI (urinary tract infection)   . Anemia     hx  . Coronary artery disease     a. s/p NSTEMI 02/2009 - PCI LCX with Xience DES. Otherwise branch vessel and Dist. RCA dzs. NL EF.   Marland Kitchen Myocardial infarction   . CHF (congestive heart failure)   . Kidney stones   . UTI (lower urinary tract infection)   . Constipation   . Hernia, umbilical   . History of blood transfusion    Past Surgical History:  Past Surgical History  Procedure  Date  . Vesicovaginal fistula closure w/ tah   . Colonoscopy 11/2010  . Upper gastrointestinal endoscopy 11/2010  . Femoral artery - popliteal artery bypass graft     left  . Abdominal hysterectomy     complete  . Femoral-popliteal bypass graft 08/22/2011    Procedure: BYPASS GRAFT FEMORAL-POPLITEAL ARTERY;  Surgeon: Gretta Began, MD;  Location: [facility] OR;  Service: Vascular;  Laterality: Right;  Thrombectomy and Revision using 7mm x 10cm stretch goretex graft  . Intraoperative arteriogram 08/22/2011    Procedure: INTRA OPERATIVE ARTERIOGRAM;  Surgeon: Gretta Began, MD;  Location: [facility] OR;  Service: Vascular;  Laterality: Right;  to lower leg  . Multiple tooth extractions 08-29-2011    5 teeth extracted   . Pci 01/17/12    RLE  . Breast lumpectomy     right  . Tonsillectomy and adenoidectomy   . Coronary angioplasty with stent placement   . Carotid endarterectomy ~ 2005    left  . Aorta - bilateral femoral artery bypass graft 03/26/2012    Procedure: AORTA BIFEMORAL BYPASS GRAFT;  Surgeon: Larina Earthly, MD;  Location: [facility] OR;  Service: Vascular;  Laterality: N/A;  Aortic-bifemoral bypass using 14x31mm Hemashield graft .   . Umbilical hernia repair 03/26/2012    Procedure: HERNIA REPAIR UMBILICAL ADULT;  Surgeon: Larina Earthly, MD;  Location: [facility] OR;  Service: Vascular;  Laterality: N/A;  Removal of Umbilical hernia sac  . Femoral artery exploration 03/26/2012    Procedure: FEMORAL ARTERY EXPLORATION;  Surgeon: Larina Earthly, MD;  Location: [facility] OR;  Service: Vascular;  Laterality: Right;  with Revision of Popliteal-Peroneal bypass graft using 6mm x 10cm thin wall goretex graft  . Amputation 04/23/2012    Procedure: AMPUTATION BELOW KNEE;  Surgeon: Larina Earthly, MD;  Location: [facility] OR;  Service: Vascular;  Laterality: Right;   Social History:  reports that she quit smoking about 10 years ago. Her smoking use included Cigarettes. She has a 78 pack-year smoking history. She has never used smokeless tobacco. She  reports that she drinks about 7 ounces of alcohol per week. She reports that she does not use illicit drugs.  Family / Support Systems Marital Status: Married How Long?: 46 years Patient Roles: Parent;Spouse Spouse/Significant Other: Josslynn Mentzer @ (H) [phone] or (C) [phone] Children: adult children living out of home but local Anticipated Caregiver: Husband + occ. Daughter/Son Ability/Limitations of Caregiver: Armed forces operational officer Availability: 24/7 Family Dynamics: husband very attentive and supportive - often answers questions before pt and quickly denies any concerns about their ability to manage at home   Social History Preferred language: English Religion: Presbyterian Cultural Background: NA Read: Yes Write: Yes Employment Status: Disabled Fish farm manager Issues: none Guardian/Conservator: none   Abuse/Neglect Physical Abuse: Denies Verbal Abuse: Denies Sexual Abuse: Denies Exploitation of patient/patient's resources: Denies Self-Neglect: Denies  Emotional Status Pt's affect, behavior adn adjustment status: Pt very pleasant, soft-spoken and motivated for CIR to rebuild her strength.  Denies any significant emotional distress - noting that her family is extremely supportive and that this helps her cope.  Will monitor through CIR stay to determine if neuropsych intrvention is appropriate. Recent Psychosocial Issues: Chronic health problems Pyschiatric History: Pt reports having periods of anxiety - both pt and husband report that she uses xanax regularly which seems to manage this Substance Abuse History: none  Patient / Family Perceptions, Expectations & Goals Pt/Family understanding of illness & functional limitations: Pt and husband with good understanding of medical issues to date and of her functional limitations Premorbid pt/family roles/activities: pt is independent overall at home and uses rolling walker Anticipated changes in  roles/activities/participation: little change anticipated as husband was providing assistance PTA Pt/family expectations/goals: "I just want to build my strength back up"  [employer]: None Premorbid Home Care/DME Agencies: None Transportation available at discharge: yes  Discharge Planning Living Arrangements: Spouse/significant other Support Systems: Spouse/significant other;Children Type of Residence: Private residence Community education officer Resources: Administrator (specify) Herbalist) Financial Resources: Oceanographer Screen Referred: No Living Expenses: Database administrator Management: Spouse Do you have any problems obtaining your medications?: No Home Management: husband Patient/Family Preliminary Plans: Pt plans to return home with her husbad to resume caregiver role Social Work Anticipated Follow Up Needs: HH/OP Expected length of stay: 10-14 days  Clinical Impression Very pleasant, talkative, motivated woman here with husband at bedside and very supportive.  Denies any significant emotional distress, however, will monitor throughout CIR stay.  Husband prepared to provide 24/7 assistance upon d/c.  Raneen Jaffer 04/30/2012, 3:53 PM

## 2012-04-30 NOTE — Progress Notes (Signed)
[facility]patient Rehabilitation Center Individual Statement of Services  Patient Name:  [name]  Date:  04/30/2012  Welcome to the Inpatient Rehabilitation Center.  Our goal is to provide you with an individualized program based on your diagnosis and situation, designed to meet your specific needs.  With this comprehensive rehabilitation program, you will be expected to participate in at least 3 hours of rehabilitation therapies Monday-Friday, with modified therapy programming on the weekends.  Your rehabilitation program will include the following services:  Physical Therapy (PT), Occupational Therapy (OT), 24 hour per day rehabilitation nursing, Therapeutic Recreaction (TR), Case Management (Social Worker), Rehabilitation Medicine, Nutrition Services and Pharmacy Services  Weekly team conferences will be held on Tuesdays to discuss your progress.  Your  Social Worker will talk with you frequently to get your input and to update you on team discussions.  Team conferences with you and your family in attendance may also be held.  Expected length of stay: 10-14 days  Overall anticipated outcome: minimal assistance  Depending on your progress and recovery, your program may change.  Your  Social Worker will coordinate services and will keep you informed of any changes.  Your  Social Worker's name and contact numbers are listed  below.  The following services may also be recommended but are not provided by the Inpatient Rehabilitation Center:   Driving Evaluations  Home Health Rehabiltiation Services  Outpatient Rehabilitatation [facility]  Vocational Rehabilitation   Arrangements will be made to provide these services after discharge if needed.  Arrangements include referral to agencies that provide these services.  Your insurance has been verified to be:  Medicare and BCBS Your primary doctor is:  Dr. Evlyn Kanner  Pertinent information will be shared with your doctor and your insurance  company.  Social Worker:  [place], [place] [phone] or [phone]  Information discussed with and copy given to patient by: Amada Jupiter, 04/30/2012, 3:58 PM

## 2012-04-30 NOTE — Progress Notes (Signed)
Physical Therapy Session Note  Patient Details  Name: [name] MRN: [id number] Date of Birth: 1945/11/19  Today's Date: 04/30/2012 Time: Session #1: 10:36- 11:31, Session #2: [id number] Time Calculation (min): Session #1: 55 Session #2: 43 min  Short Term Goals: Week 1:  PT Short Term Goal 1 (Week 1): Pt will perform sit <> stand with supervision.  PT Short Term Goal 2 (Week 1): Pt will perform bed <> chair transfer with min assist.  PT Short Term Goal 3 (Week 1): Pt will perform 15' ambulation with moderate assist PT Short Term Goal 4 (Week 1): Pt will verbalize complience with developing HEP outside of therapy session.  Skilled Therapeutic Interventions/Progress Updates:    Session #1: This session focused on bed mobility, transfers (squat pivot and stand pivot with RW) to and from [facility], stairs 2" and 4" using revers technique with RW, gait with RW with chair to follow (husband) to encourage increased gait distance, and WC mobility.     Session #2: This session focused on reinforcing WC education (parts) and mobility, gait with RW, car transfer, gait in adjustable stairwell (4"steps) with bil rails (husband planning on installing home entry with bil rails), and leg there-ex focusing on hip extension/knee extension exercises.    Therapy Documentation Precautions:  Precautions Precautions: Fall Precaution Comments: Limb guard Rt. LE  Restrictions Weight Bearing Restrictions: Yes General:   Vital Signs: Therapy Vitals Temp: 97.9 F (36.6 C) Temp src: Oral Pulse Rate: 86  Resp: 18  BP: 128/68 mmHg Patient Position, if appropriate: Lying Oxygen Therapy SpO2: 98 % O2 Device: None (Room air) Pain:  no reports Mobility: Bed Mobility Supine to Sit: 6: Modified independent (Device/Increase time);With rails;HOB elevated Sitting - Scoot to Edge of Bed: 6: Modified independent (Device/Increase time) Sit to Supine: 6: Modified independent (Device/Increase time);With rail;HOB  flat Transfers Sit to Stand: 5: Supervision Sit to Stand Details: Verbal cues for precautions/safety;Verbal cues for technique;Verbal cues for sequencing Sit to Stand Details (indicate cue type and reason): supervision for safety Stand to Sit: 5: Supervision;With armrests;With upper extremity assist;To bed;To chair/3-in-1 Stand to Sit Details (indicate cue type and reason): Verbal cues for sequencing;Verbal cues for technique;Verbal cues for precautions/safety Stand to Sit Details: supervision for safety Stand Pivot Transfers: 5: Supervision Stand Pivot Transfer Details: Verbal cues for precautions/safety;Verbal cues for technique Stand Pivot Transfer Details (indicate cue type and reason): supervision for safety Locomotion : Ambulation Ambulation: Yes Ambulation/Gait Assistance: 4: Min assist Ambulation Distance (Feet): 20 Feet (20' x 4) Assistive device: Rolling walker;Parallel bars Ambulation/Gait Assistance Details: Verbal cues for technique;Verbal cues for sequencing;Verbal cues for precautions/safety;Verbal cues for safe use of DME/AE;Manual facilitation for weight shifting Ambulation/Gait Assistance Details: manual assist for balance while hopping.   Stairs / Additional Locomotion Stairs: Yes Stairs Assistance: 3: Mod assist Stairs Assistance Details: Verbal cues for precautions/safety;Verbal cues for technique;Verbal cues for sequencing;Verbal cues for safe use of DME/AE;Manual facilitation for weight shifting Stairs Assistance Details (indicate cue type and reason): mod assist to hop up 2 and 4 inch steps backwards, PM session we went up one 4 inch step forwards with bil rails.   Stair Management Technique: Forwards;Backwards;Two rails;Step to pattern;With walker Number of Stairs: 3  Height of Stairs: 4  (2-4") Wheelchair Mobility Wheelchair Mobility: Yes Wheelchair Assistance: 5: Supervision Wheelchair Assistance Details: Verbal cues for sequencing;Verbal cues for  technique;Verbal cues for precautions/safety;Verbal cues for safe use of DME/AE Wheelchair Propulsion: Both upper extremities Wheelchair Parts Management: Needs assistance Distance: 150  Exercises: Total Joint Exercises Short Arc Quad: AROM;Right;10 reps;Supine Straight Leg Raises: AROM;Right;10 reps;Supine Amputee Exercises Quad Sets: AROM;Both;10 reps;Supine Hip Extension: AROM;10 reps;Supine;Sidelying;Left Hip Flexion/Marching: AROM;Left;10 reps;Supine Other Treatments: Treatments Therapeutic Activity: Car transfers with supervision, verbal cues for safety, hand placement and strategies to make it easier.  Sedan level transfer.    See FIM for current functional status  Therapy/Group: Individual Therapy  [name] Joiner B. Levina Boyack, PT, DPT [phone]   04/30/2012, 4:52 PM

## 2012-04-30 NOTE — Progress Notes (Signed)
Patient ID: [name], female   DOB: 07/26/1945, 66 y.o.   MRN: [id number] The patient is postop day 7 today. I removed her dressing. She did have a great deal of skin edge bleeding onto this. She is on Plavix. There does not appear to be any drainage from the stump whatsoever. The skin wounds all looked completely perfect and everything is intact. There is no evidence of any difficulty with healing. I discussed this at length with the patient, her husband and son present. Continue rehabilitation.

## 2012-04-30 NOTE — Progress Notes (Signed)
Occupational Therapy Session Notes  Patient Details  Name: [name] MRN: [id number] Date of Birth: Sep 04, 1945  Today's Date: 04/30/2012 Time: 0805-0900 and 200-230 Time Calculation (min): 55 min and 30 min  Short Term Goals: Week 1:  OT Short Term Goal 1 (Week 1): Pt will transfer to toilet and or BSC with RW with supervision. OT Short Term Goal 2 (Week 1): Pt will dress LB with supervision. OT Short Term Goal 3 (Week 1): Pt will bathe with supervision.  Skilled Therapeutic Interventions/Progress Updates:  1)  Upon arrival, patient had just finished breakfast and sitting up in bed with husband at bedside.  Patient engaged in sponge bath (declined shower, wash hair in sink while seated, dress and groom.  Focus session on activity tolerance, safe transfers, sit><stand, standing balance and discussion regarding shower setup at home with patient and husband.  Patient requested back to bed to rest after BADL session.  2)  Patient up in w/c upon arrival.  Demonstrated shower transfer given patient's home setup and patient return demonstrated the transfer using a RW and shower chair while simulating patient's home environment.  Patient seemed pleased with the option to use a shower seat instead of the potty chair she had borrowed from a friend or the use of the built in shower seat since it is too far from the shower entrance to be safe at this time.  Patient requested back to bed and performed squat pivot with supervision.   Therapy Documentation Precautions:  Precautions Precautions: Fall Precaution Comments: Limb guard Rt. LE  Restrictions Weight Bearing Restrictions: Yes Pain: 0/10, pre-medicated  See FIM for current functional status  Therapy/Group: Individual Therapy  Saben Donigan 04/30/2012, 8:54 AM

## 2012-05-01 ENCOUNTER — Inpatient Hospital Stay (HOSPITAL_COMMUNITY): Payer: Medicare Other | Admitting: Physical Therapy

## 2012-05-01 ENCOUNTER — Encounter (HOSPITAL_COMMUNITY): Payer: Medicare Other

## 2012-05-01 ENCOUNTER — Inpatient Hospital Stay (HOSPITAL_COMMUNITY): Payer: Medicare Other | Admitting: Occupational Therapy

## 2012-05-01 DIAGNOSIS — I739 Peripheral vascular disease, unspecified: Secondary | ICD-10-CM

## 2012-05-01 DIAGNOSIS — L98499 Non-pressure chronic ulcer of skin of other sites with unspecified severity: Secondary | ICD-10-CM

## 2012-05-01 DIAGNOSIS — S78119A Complete traumatic amputation at level between unspecified hip and knee, initial encounter: Secondary | ICD-10-CM

## 2012-05-01 LAB — GLUCOSE, CAPILLARY
Glucose-Capillary: 225 mg/dL — ABNORMAL HIGH (ref 70–99)
Glucose-Capillary: 121 mg/dL — ABNORMAL HIGH (ref 70–99)

## 2012-05-01 NOTE — Progress Notes (Signed)
Physical Therapy Session Note  Patient Details  Name: [name] MRN: [id number] Date of Birth: [birth year]  Today's Date: 05/01/2012 Time: 0802-0901 Time Calculation (min): 59 min  Short Term Goals: Week 1:  PT Short Term Goal 1 (Week 1): Pt will perform sit <> stand with supervision.  PT Short Term Goal 2 (Week 1): Pt will perform bed <> chair transfer with min assist.  PT Short Term Goal 3 (Week 1): Pt will perform 15' ambulation with moderate assist PT Short Term Goal 4 (Week 1): Pt will verbalize complience with developing HEP outside of therapy session.  Skilled Therapeutic Interventions/Progress Updates:    Session #1: This session focused on WC mobility with multiple rest breaks needed on the way to the gym (decreased upper extremity endurance), Stair practice with Bil rails, furniture transfers in the ADL apartment.  See details below.    Therapy Documentation Precautions:  Precautions Precautions: Fall Precaution Comments: Limb guard Rt. LE  Restrictions Weight Bearing Restrictions: Yes General:   Pain: Pain Assessment Pain Assessment: 0-10 Pain Score:   5 Pain Type: Acute pain Pain Location: Leg Pain Orientation: Right Pain Descriptors: Aching Pain Onset: On-going Pain Intervention(s): Repositioned (declined medication) Mobility: Bed Mobility Supine to Sit: 4: Min assist;HOB flat Supine to Sit Details (indicate cue type and reason): min hand held assist when attempted from bed in ADL apartment.  Pt was unable to work her way up to sitting on her own.   Sitting - Scoot to Edge of Bed: 6: Modified independent (Device/Increase time) Sit to Supine: 6: Modified independent (Device/Increase time) Transfers Sit to Stand: 5: Supervision;4: Min assist Sit to Stand Details: Verbal cues for precautions/safety;Verbal cues for technique;Verbal cues for sequencing Sit to Stand Details (indicate cue type and reason): she did have one instance from low furniture surface  where she needed min assist to stand today Stand to Sit: 5: Supervision Stand to Sit Details (indicate cue type and reason): Verbal cues for sequencing;Verbal cues for technique;Verbal cues for precautions/safety Stand Pivot Transfers: 5: Supervision Stand Pivot Transfer Details: Verbal cues for precautions/safety;Verbal cues for technique Locomotion : Ambulation Ambulation: Yes Ambulation/Gait Assistance: 4: Min assist Ambulation Distance (Feet): 20 Feet Assistive device: Rolling walker Ambulation/Gait Assistance Details: Verbal cues for technique;Verbal cues for sequencing;Verbal cues for precautions/safety;Verbal cues for safe use of DME/AE;Manual facilitation for weight shifting Ambulation/Gait Assistance Details: min assist for balance while walking.   Stairs / Additional Locomotion Stairs: Yes Stairs Assistance: 2: Max Editor, commissioning Details: Verbal cues for precautions/safety;Verbal cues for technique;Verbal cues for sequencing;Verbal cues for safe use of DME/AE;Manual facilitation for weight shifting Stairs Assistance Details (indicate cue type and reason): max assist on real stairwell today after arms had fatigued to support trunk over arms while hopping up on L leg Stair Management Technique: Two rails;Forwards Number of Stairs: 3  Height of Stairs: 4  Corporate treasurer: Yes Wheelchair Assistance: 5: Financial planner Details: Verbal cues for sequencing;Verbal cues for technique;Verbal cues for precautions/safety;Verbal cues for safe use of DME/AE Wheelchair Propulsion: Both upper extremities Wheelchair Parts Management: Supervision/cueing Distance: 150    Other Treatments: Treatments Therapeutic Activity: We worked on bed mobility and furniture transfers in the ADL apartment.  She needed min assist to get supine to sitting from a regular bed.  She neede min assist to get up off of the couch due to low and compliant sitting  surface.    See FIM for current functional status  Therapy/Group: Individual Therapy  [name] Joiner B.  Ryle Buscemi, PT, DPT [phone]   05/01/2012, 4:04 PM

## 2012-05-01 NOTE — Progress Notes (Signed)
Patient ID: [name], female   DOB: [birth year], 67 y.o.   MRN: [id number]  Subjective/Complaints: Overall pain better control. Tolerating therapy well. Very motivated. Doesn't want to go home too soon Review of Systems  Musculoskeletal: Positive for joint pain.  Psychiatric/Behavioral: The patient is nervous/anxious. The patient does not have insomnia.    Objective: Vital Signs: Blood pressure 142/50, pulse 91, temperature 97.4 F (36.3 C), temperature source Oral, resp. rate 18, weight 57.8 kg (127 lb 6.8 oz), SpO2 97.00%. No results found. Results for orders placed during the hospital encounter of 04/26/12 (from the past 72 hour(s))  GLUCOSE, CAPILLARY     Status: Abnormal   Collection Time   04/28/12 11:43 AM      Component Value Range Comment   Glucose-Capillary 205 (*) 70 - 99 mg/dL    Comment 1 Notify RN     GLUCOSE, CAPILLARY     Status: Abnormal   Collection Time   04/28/12  2:12 PM      Component Value Range Comment   Glucose-Capillary 64 (*) 70 - 99 mg/dL    Comment 1 Notify RN     GLUCOSE, CAPILLARY     Status: Abnormal   Collection Time   04/28/12  2:40 PM      Component Value Range Comment   Glucose-Capillary 57 (*) 70 - 99 mg/dL   GLUCOSE, CAPILLARY     Status: Abnormal   Collection Time   04/28/12  3:10 PM      Component Value Range Comment   Glucose-Capillary 53 (*) 70 - 99 mg/dL    Comment 1 Notify RN     GLUCOSE, CAPILLARY     Status: Normal   Collection Time   04/28/12  3:36 PM      Component Value Range Comment   Glucose-Capillary 75  70 - 99 mg/dL    Comment 1 Notify RN     GLUCOSE, CAPILLARY     Status: Abnormal   Collection Time   04/28/12  5:00 PM      Component Value Range Comment   Glucose-Capillary 137 (*) 70 - 99 mg/dL    Comment 1 Notify RN     GLUCOSE, CAPILLARY     Status: Abnormal   Collection Time   04/28/12  8:54 PM      Component Value Range Comment   Glucose-Capillary 163 (*) 70 - 99 mg/dL    Comment 1 Notify RN       GLUCOSE, CAPILLARY     Status: Abnormal   Collection Time   04/29/12  7:27 AM      Component Value Range Comment   Glucose-Capillary 108 (*) 70 - 99 mg/dL    Comment 1 Notify RN     GLUCOSE, CAPILLARY     Status: Abnormal   Collection Time   04/29/12 11:34 AM      Component Value Range Comment   Glucose-Capillary 244 (*) 70 - 99 mg/dL    Comment 1 Notify RN     GLUCOSE, CAPILLARY     Status: Normal   Collection Time   04/29/12  4:19 PM      Component Value Range Comment   Glucose-Capillary 79  70 - 99 mg/dL    Comment 1 Notify RN     GLUCOSE, CAPILLARY     Status: Abnormal   Collection Time   04/29/12  8:54 PM      Component Value Range Comment   Glucose-Capillary 181 (*) 70 - 99  mg/dL    Comment 1 Notify RN     GLUCOSE, CAPILLARY     Status: Abnormal   Collection Time   04/30/12  7:17 AM      Component Value Range Comment   Glucose-Capillary 165 (*) 70 - 99 mg/dL    Comment 1 Notify RN     GLUCOSE, CAPILLARY     Status: Abnormal   Collection Time   04/30/12 11:33 AM      Component Value Range Comment   Glucose-Capillary 175 (*) 70 - 99 mg/dL    Comment 1 Notify RN     GLUCOSE, CAPILLARY     Status: Abnormal   Collection Time   04/30/12  4:56 PM      Component Value Range Comment   Glucose-Capillary 214 (*) 70 - 99 mg/dL    Comment 1 Documented in Chart      Comment 2 Notify RN     GLUCOSE, CAPILLARY     Status: Abnormal   Collection Time   04/30/12  9:32 PM      Component Value Range Comment   Glucose-Capillary 115 (*) 70 - 99 mg/dL    Comment 1 Notify RN     GLUCOSE, CAPILLARY     Status: Abnormal   Collection Time   05/01/12  7:15 AM      Component Value Range Comment   Glucose-Capillary 141 (*) 70 - 99 mg/dL    Comment 1 Notify RN       HEENT: normal Cardio: RRR Resp: CTA B/L GI: BS positive Extremity:  No Edema Skin:   Other R BKA  ACE  Neuro: Alert/Oriented Musc/Skel:  Other Able to flex R hip , abd and add. Left leg NVI. Right BK wound with  substantial serosanginous dc from central part of wound. Otherwise wound is well approximated and looks good Gen ;NAD  Assessment/Plan: 1. Functional deficits secondary to R BKA due to PVD associated with DM which require 3+ hours per day of interdisciplinary therapy in a comprehensive inpatient rehab setting. Physiatrist is providing close team supervision and 24 hour management of active medical problems listed below. Physiatrist and rehab team continue to assess barriers to discharge/monitor patient progress toward functional and medical goals. FIM: FIM - Bathing Bathing Steps Patient Completed: Chest;Right Arm;Left Arm;Abdomen;Front perineal area;Right upper leg;Left upper leg;Left lower leg (including foot) Bathing: 4: Min-Patient completes 8-9 12f 10 parts or 75+ percent  FIM - Upper Body Dressing/Undressing Upper body dressing/undressing steps patient completed: Thread/unthread right sleeve of pullover shirt/dresss;Thread/unthread left sleeve of pullover shirt/dress;Put head through opening of pull over shirt/dress;Pull shirt over trunk Upper body dressing/undressing: 5: Set-up assist to: Obtain clothing/put away FIM - Lower Body Dressing/Undressing Lower body dressing/undressing steps patient completed: Thread/unthread left pants leg;Thread/unthread right pants leg;Pull pants up/down (declined underwear or shoe) Lower body dressing/undressing: 4: Min-Patient completed 75 plus % of tasks  FIM - Toileting Toileting steps completed by patient: Adjust clothing prior to toileting;Adjust clothing after toileting;Performs perineal hygiene Toileting Assistive Devices: Grab bar or rail for support Toileting: 5: Supervision: Safety issues/verbal cues  FIM - Diplomatic Services operational officer Devices: Grab bars Toilet Transfers: 6-More than reasonable amt of time;5-From toilet/BSC: Supervision (verbal cues/safety issues)  FIM - Banker  Devices: Walker;HOB elevated;Arm rests Bed/Chair Transfer: 6: Supine > Sit: No assist;6: Sit > Supine: No assist;5: Bed > Chair or W/C: Supervision (verbal cues/safety issues);5: Chair or W/C > Bed: Supervision (verbal cues/safety issues)  FIM - Locomotion:  Wheelchair Distance: 150 Locomotion: Wheelchair: 5: Travels 150 ft or more: maneuvers on rugs and over door sills with supervision, cueing or coaxing FIM - Locomotion: Ambulation Locomotion: Ambulation Assistive Devices: Designer, industrial/product Ambulation/Gait Assistance: 4: Min assist Locomotion: Ambulation: 1: Travels less than 50 ft with minimal assistance (Pt.>75%)  Comprehension Comprehension Mode: Auditory Comprehension: 5-Understands basic 90% of the time/requires cueing < 10% of the time  Expression Expression Mode: Verbal Expression: 5-Expresses complex 90% of the time/cues < 10% of the time  Social Interaction Social Interaction: 6-Interacts appropriately with others with medication or extra time (anti-anxiety, antidepressant).  Problem Solving Problem Solving: 5-Solves basic 90% of the time/requires cueing < 10% of the time  Memory Memory: 4-Recognizes or recalls 75 - 89% of the time/requires cueing 10 - 24% of the time  Medical Problem List and Plan:  1. 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 DVT Prophylaxis/Anticoagulation: Subcutaneous Lovenox. Monitor platelet counts and any signs of bleeding  3. Pain Management: Demerol 50 mg every 4 hours as needed. Monitor mental status closely and any signs of increased nausea  4. Mood: Xanax 0.25 mg 3 times daily as needed. Provide emotional support and positive reinforcement  5. Neuropsych: This patient is capable of making decisions on his/her own behalf.    6.Postoperative anemia .Niferex twice a day. Followup CBC  7. Coronary artery disease with PTCA . continue aspirin Plavix therapy. Patient denies any chest pain or shortness of breath  8. Diabetes mellitus with peripheral neuropathy. Latest hemoglobin A1c of 6.3 .D/C'ed Glyburide 2.5 mg daily due to hypoglycemia , trandjenta 5 mg each bedtime .Metformin/actos to qd Check blood sugars a.c. and at bedtime-no newe changes until consistent pattern develops. 9. Diastolic congestive heart failure. Lasix 40 mg daily. Monitor for any signs of fluid overload, keep left leg elevated when possible. 10. Hypertension . imdur 60 mg daily, Altace 2.5 mg twice a day, Lopressor 50 mg twice a day  11. Constipation. Adjusted bowel program as needed add senna    LOS (Days) 5 A FACE TO FACE EVALUATION WAS PERFORMED  SWARTZ,ZACHARY T 05/01/2012, 8:19 AM

## 2012-05-01 NOTE — Progress Notes (Addendum)
Physical Therapy Session Note  Patient Details  Name: [name] MRN: [id number] Date of Birth: May 01, 1946  Today's Date: 05/01/2012 Time: 1478-2956 Time Calculation (min): 30 min  Short Term Goals: Week 1:  PT Short Term Goal 1 (Week 1): Pt will perform sit <> stand with supervision.  PT Short Term Goal 2 (Week 1): Pt will perform bed <> chair transfer with min assist.  PT Short Term Goal 3 (Week 1): Pt will perform 15' ambulation with moderate assist PT Short Term Goal 4 (Week 1): Pt will verbalize complience with developing HEP outside of therapy session.  Skilled Therapeutic Interventions/Progress Updates:  Instructed pt in sit/stand transfer to/from WC/mat table using RW with min A +1 for safe technique.  Pt performed sit/supine/sit transfers on mat table with supervision, cues for efficiency.  Therapeutic exercises with verbal cues for proper technique:  10X glut sets, 10x right quad sets, 10x isometric hip extension, 10x right hip extension in side lying, 20x right knee extension. Review with pt these exercises which will become a part of her home exercise program upon discharge. Pt ambulating with RW and min A +1 x 8' with cuing for scapular stabilization and depression of bilateral upper extremities to control hop, decrease pounding effects, and preserve shoulders.       Therapy Documentation Precautions:  Precautions Precautions: Fall Precaution Comments: Limb guard Rt. LE  Restrictions Weight Bearing Restrictions: Yes Pain: Pain Assessment Pain Assessment: 0-10 Pain Score:   5 Pain Type: Acute pain Pain Location: Leg Pain Orientation: Right Pain Descriptors: Aching Pain Onset: On-going Pain Intervention(s): Repositioned (declined medication)    See FIM for current functional status  Therapy/Group: Individual Therapy  Rexene Agent 05/01/2012, 3:30 PM

## 2012-05-01 NOTE — Progress Notes (Signed)
Occupational Therapy Session Note  Patient Details  Name: [name] MRN: [id number] Date of Birth: May 19, 1946  Today's Date: 05/01/2012 Time: 1100-1145 Time Calculation (min): 45 min  Short Term Goals: Week 1:  OT Short Term Goal 1 (Week 1): Pt will transfer to toilet and or BSC with RW with supervision. OT Short Term Goal 2 (Week 1): Pt will dress LB with supervision. OT Short Term Goal 3 (Week 1): Pt will bathe with supervision.  Skilled Therapeutic Interventions/Progress Updates:  Patient resting in bed upon arrival.  Therapeutic activity to include safe bed><w/c transfer with squat pivot at close supervision and vcs and transfers with RW with close supervision - min assist with one LOB when turning, sitting balance activity to include use of 1 lb wts on bilateral wrists to improve core strength and BUE strength during activity.  Therapy Documentation Precautions:  Precautions Precautions: Fall Precaution Comments: Limb guard Rt. LE  Restrictions Weight Bearing Restrictions: Yes Pain: No c/o pain  Therapy/Group: Individual Therapy  Carma Dwiggins 05/01/2012, 12:18 PM

## 2012-05-01 NOTE — Progress Notes (Signed)
Occupational Therapy Note  Patient Details  Name: [name] MRN: [id number] Date of Birth: [birth year] Today's Date: 05/01/2012  Time In:  09:00  Time Out:  10:00.  Individual session, no c/o pain.  ADL retraining at sink level with emphasis on w/c use, safety, sit to stand, stand to sit, toilet transfers, toilet hygiene.  Patient's bathroom at home may be very difficult to navigate a wheelchair in so working on patient's ability to walk into to bathroom with walker. Currently requires steadying assist but anticipate pt will be able to do this with supervision as activity tolerance improves. Provided education re:  Care of residual limb, care of intact foot, amputee support group, typical course of working toward use of a prosthetic limb,desentization,  positioning of residual limb, and outpatient rehab process.  Patient also provided with written material. Will issue inspection mirror.   Norton Pastel 05/01/2012, 12:22 PM

## 2012-05-02 ENCOUNTER — Inpatient Hospital Stay (HOSPITAL_COMMUNITY): Payer: Medicare Other | Admitting: Occupational Therapy

## 2012-05-02 ENCOUNTER — Inpatient Hospital Stay (HOSPITAL_COMMUNITY): Payer: Medicare Other | Admitting: Physical Therapy

## 2012-05-02 DIAGNOSIS — S78119A Complete traumatic amputation at level between unspecified hip and knee, initial encounter: Secondary | ICD-10-CM

## 2012-05-02 DIAGNOSIS — L98499 Non-pressure chronic ulcer of skin of other sites with unspecified severity: Secondary | ICD-10-CM

## 2012-05-02 DIAGNOSIS — I739 Peripheral vascular disease, unspecified: Secondary | ICD-10-CM

## 2012-05-02 LAB — GLUCOSE, CAPILLARY
Glucose-Capillary: 151 mg/dL — ABNORMAL HIGH (ref 70–99)
Glucose-Capillary: 152 mg/dL — ABNORMAL HIGH (ref 70–99)

## 2012-05-02 MED ORDER — PIOGLITAZONE HCL-METFORMIN HCL 15-500 MG PO TABS
1.0000 | ORAL_TABLET | Freq: Every day | ORAL | Status: DC
  Administered 2012-05-02 – 2012-05-08 (×7): 1 via ORAL
  Filled 2012-05-02 (×5): qty 1

## 2012-05-02 NOTE — Progress Notes (Addendum)
Physical Therapy Session Note  Patient Details  Name: [name] MRN: [id number] Date of Birth: 1946/05/10  Today's Date: 05/02/2012 Time: Session #1: 0803-0903, Session #2: 1441-15:35 Time Calculation (min): Session #1: 60 min, Session #2: 54 min  Short Term Goals: Week 1:  PT Short Term Goal 1 (Week 1): Pt will perform sit <> stand with supervision.  PT Short Term Goal 1 - Progress (Week 1): Progressing toward goal PT Short Term Goal 2 (Week 1): Pt will perform bed <> chair transfer with min assist.  PT Short Term Goal 2 - Progress (Week 1): Met PT Short Term Goal 3 (Week 1): Pt will perform 15' ambulation with moderate assist PT Short Term Goal 3 - Progress (Week 1): Met PT Short Term Goal 4 (Week 1): Pt will verbalize complience with developing HEP outside of therapy session. PT Short Term Goal 4 - Progress (Week 1): Progressing toward goal  Skilled Therapeutic Interventions/Progress Updates:    Session #1: This session focused on dynamic sitting balance while performing functional task of putting on pants and shoe to get ready for therapy outside of the room and dynamic standing balance task of pulling up her pants on her own after tolieting.  Sitting balance with supervision, standing with minguard assist.  WC mobility in hallway and over carpeted surfaces (pt has carpet in most of her home medium length-this will be much more fatiguing on her arms than the tile floors here) supervision @50 ' with 3-4 seated rest breaks due to upper extremity fatigue.  We practiced squat pivot and stand pivot transfers left and right with the RW and without mostly supervision except for one squat pivot transfer requiring min assist to keep from falling (pt was trying to do it the "correct" way and ended up confusing herself.  TE standing at bar -3 way hip exercises and single leg mini squats x10 each right leg for hip, left leg for squats supervision, rest breaks needed between each set of exercises.   Nu Step level 3 2 x 5 mins with seated rest break between both arms and left leg working on endurance and strengthening.    Session #2: This session focused on stair training with Bil rails max assist to support trunk while hopping up 4" steps x 3 steps, mod assist while hopping down 4" steps x 3 steps with bil rails.  Gait in the hallway with RW and chair to follow to encourage increased ambulation distance, endurance and upper extremity strength.  Min assist 25'x3 with seated rest breaks in between.  TE: standing at the parallel bar 3 way hip exercises x 12 reps each, sitting rest breaks between reps, mat exercises: quad sets, glute sets, SLR, hip abduction in supine and S/L, hip ext in S/L, bridges all 10-12 reps each right leg.  Stretched anterior portion of right hip and thigh over the edge of the mat table with pt in supine 2 x 30" holds (pt can feel this pulling all the way up in to her abdomen due to her abdominal incision.  She reports the surgeon wants her to lay completely flat for 20 mins 1-2 times per day.   Therapy Documentation Precautions:  Precautions Precautions: Fall Precaution Comments: Limb guard Rt. LE  Restrictions Weight Bearing Restrictions: Yes General:    Pain: Pain Assessment Pain Assessment: 0-10 Pain Score: 0-No pain Faces Pain Scale: No hurt Mobility:   Locomotion : Wheelchair Mobility Distance: 150   See FIM for current functional status  Therapy/Group: Individual  Therapy  Carl Butner B. Younique Casad, PT, DPT [phone]   05/02/2012, 12:24 PM

## 2012-05-02 NOTE — Progress Notes (Signed)
Social Work Patient ID: [name], female   DOB: August 12, 1945, 66 y.o.   MRN: [id number]  Met yesterday afternoon with patient and husband to review team conference.  Both very pleased with targeted d/c date of 11/5 at modified independent to supervision level.  Husband reports he is making some home modifications, but anticipates them to be completed by d/c.  No concerns at this point.  Muhsin Doris

## 2012-05-02 NOTE — Patient Care Conference (Signed)
Inpatient RehabilitationTeam Conference Note Date: 05/01/2012   Time: 2:20 PM    Patient Name: [name]      Medical Record Number: [id number]  Date of Birth: [birth year] Sex: Female         Room/Bed: 4010/4010-01 Payor Info: Payor: MEDICARE  Plan: MEDICARE PART A AND B  Product Type: *No Product type*     Admitting Diagnosis: RT AKA  Admit Date/Time:  04/26/2012  4:37 PM Admission Comments: No comment available   Primary Diagnosis:  S/P BKA (below knee amputation) Principal Problem: S/P BKA (below knee amputation)  Patient Active Problem List   Diagnosis Date Noted  . S/P BKA (below knee amputation) 04/27/2012  . Acute myocardial infarction, subendocardial infarction, initial episode of care 04/01/2012  . Acute combined systolic and diastolic heart failure 03/30/2012  . Pulmonary edema, acute 03/29/2012  . Acute respiratory failure with hypoxia 03/28/2012  . Respiratory failure, post-operative 03/26/2012  . Pain in limb 03/20/2012  . Atherosclerotic PVD with intermittent claudication 02/21/2012  . Atherosclerosis of native arteries of the extremities with ulceration 11/11/2011  . Chronic ulcer of unspecified site 11/11/2011  . Cellulitis 11/11/2011  . Atherosclerosis of native arteries of the extremities with intermittent claudication 09/06/2011  . CORONARY ATHEROSCLEROSIS NATIVE CORONARY ARTERY 03/12/2009  . 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 DIABETES MELLITUS 10/11/2008  . HYPERLIPIDEMIA-MIXED 10/11/2008  . HYPERTENSION, UNSPECIFIED 10/11/2008  . UNSPECIFIED PERIPHERAL VASCULAR DISEASE 10/11/2008    Expected Discharge Date: Expected Discharge Date: 05/08/12  Team Members Present: Physician: Dr. Faith Rogue Social Worker Present: Amada Jupiter, LCSW Nurse Present: Daryll Brod, RN PT Present: Karolee Stamps, PT OT Present: Edwin Cap, Loistine Chance, OT Other (Discipline and Name): Tora Duck, PPS Coordinator     Current Status/Progress Goal Weekly  Team Focus  Medical   right bka, pain mgt, htn, type 2 dm  optimize stump managemen/edema control  pain mgt   Bowel/Bladder   Continent of bowel and bladder. LBM 04/30/12  Continent of bowel and bladder   Monitor   Swallow/Nutrition/ Hydration             ADL's   Overall Min - Supervision w/ BADL, shower and HM not yet addressed  MIN ASSIST - shower transfer, SUPERVISION - bath & dress, MOD I - toileting and toilet transfer, simple HM tasks w/c level  endurance, activity tolerance, adaptive techniques, AE, standing tolerance and balance   Mobility   supervision for squat pivot and stand pivot with RW into and out of WC, min assist from low furniture surface once fatigued.  WC mobility limited endurance-supervision (pt has carpet in her home and this will be difficult for her to manage with poor arm endurance),  min assist gait 20' with RW before fatigue, stairs max assist bil rails 4" step.  Husband putting in bil handrails at home, but I believe they are 6" steps.    supervision ambulation, min assist stairs, mod I WC  endurance, gait with RW, safe transfers, stairs   Communication             Safety/Cognition/ Behavioral Observations            Pain   Demerol 10mg  q 4hrs  <3  Offer 1hr prior to therapy session   Skin    R BKA with dressing cdi  No additional skin breakdown  Monitor for appropriate healing    Rehab Goals Patient on target to meet rehab goals: Yes *See Interdisciplinary Assessment and Plan and progress notes for  long and short-term goals  Barriers to Discharge: none identified currently    Possible Resolutions to Barriers:  tbd    Discharge Planning/Teaching Needs:  home wiht husband who can provide 24/7 assistance      Team Discussion:  Pt making good progress with therapies, however, decreased endurance appears to be the primary barrier at this point.  Anticipate supervision to mod i goals overall.  Revisions to Treatment Plan:  None   Continued Need  for Acute Rehabilitation Level of Care: The patient requires daily medical management by a physician with specialized training in physical medicine and rehabilitation for the following conditions: Daily direction of a multidisciplinary physical rehabilitation program to ensure safe treatment while eliciting the highest outcome that is of practical value to the patient.: Yes Daily medical management of patient stability for increased activity during participation in an intensive rehabilitation regime.: Yes Daily analysis of laboratory values and/or radiology reports with any subsequent need for medication adjustment of medical intervention for : Post surgical problems;Other  [name] 05/02/2012, 11:35 AM

## 2012-05-02 NOTE — Progress Notes (Signed)
Patient ID: [name], female   DOB: 1945-10-19, 66 y.o.   MRN: [id number]  Subjective/Complaints: Stump pain at times, but it's manageable Review of Systems  Musculoskeletal: Positive for joint pain.  Psychiatric/Behavioral: The patient is nervous/anxious. The patient does not have insomnia.    Objective: Vital Signs: Blood pressure 104/48, pulse 86, temperature 98 F (36.7 C), temperature source Oral, resp. rate 19, weight 57.8 kg (127 lb 6.8 oz), SpO2 94.00%. No results found. Results for orders placed during the hospital encounter of 04/26/12 (from the past 72 hour(s))  GLUCOSE, CAPILLARY     Status: Abnormal   Collection Time   04/29/12  7:27 AM      Component Value Range Comment   Glucose-Capillary 108 (*) 70 - 99 mg/dL    Comment 1 Notify RN     GLUCOSE, CAPILLARY     Status: Abnormal   Collection Time   04/29/12 11:34 AM      Component Value Range Comment   Glucose-Capillary 244 (*) 70 - 99 mg/dL    Comment 1 Notify RN     GLUCOSE, CAPILLARY     Status: Normal   Collection Time   04/29/12  4:19 PM      Component Value Range Comment   Glucose-Capillary 79  70 - 99 mg/dL    Comment 1 Notify RN     GLUCOSE, CAPILLARY     Status: Abnormal   Collection Time   04/29/12  8:54 PM      Component Value Range Comment   Glucose-Capillary 181 (*) 70 - 99 mg/dL    Comment 1 Notify RN     GLUCOSE, CAPILLARY     Status: Abnormal   Collection Time   04/30/12  7:17 AM      Component Value Range Comment   Glucose-Capillary 165 (*) 70 - 99 mg/dL    Comment 1 Notify RN     GLUCOSE, CAPILLARY     Status: Abnormal   Collection Time   04/30/12 11:33 AM      Component Value Range Comment   Glucose-Capillary 175 (*) 70 - 99 mg/dL    Comment 1 Notify RN     GLUCOSE, CAPILLARY     Status: Abnormal   Collection Time   04/30/12  4:56 PM      Component Value Range Comment   Glucose-Capillary 214 (*) 70 - 99 mg/dL    Comment 1 Documented in Chart      Comment 2 Notify RN       GLUCOSE, CAPILLARY     Status: Abnormal   Collection Time   04/30/12  9:32 PM      Component Value Range Comment   Glucose-Capillary 115 (*) 70 - 99 mg/dL    Comment 1 Notify RN     GLUCOSE, CAPILLARY     Status: Abnormal   Collection Time   05/01/12  7:15 AM      Component Value Range Comment   Glucose-Capillary 141 (*) 70 - 99 mg/dL    Comment 1 Notify RN     GLUCOSE, CAPILLARY     Status: Abnormal   Collection Time   05/01/12 11:56 AM      Component Value Range Comment   Glucose-Capillary 135 (*) 70 - 99 mg/dL    Comment 1 Notify RN     GLUCOSE, CAPILLARY     Status: Abnormal   Collection Time   05/01/12  4:59 PM      Component Value Range  Comment   Glucose-Capillary 225 (*) 70 - 99 mg/dL   GLUCOSE, CAPILLARY     Status: Abnormal   Collection Time   05/01/12 10:42 PM      Component Value Range Comment   Glucose-Capillary 121 (*) 70 - 99 mg/dL     HEENT: normal Cardio: RRR Resp: CTA B/L GI: BS positive Extremity:  No Edema Skin:   Other R BKA  ACE  Neuro: Alert/Oriented Musc/Skel:  Other Able to flex R hip , abd and add. Left leg NVI. Right BK wound with substantial serosanginous dc from central part of wound. Otherwise wound is well approximated and looks good Gen ;NAD  Assessment/Plan: 1. Functional deficits secondary to R BKA due to PVD associated with DM which require 3+ hours per day of interdisciplinary therapy in a comprehensive inpatient rehab setting. Physiatrist is providing close team supervision and 24 hour management of active medical problems listed below. Physiatrist and rehab team continue to assess barriers to discharge/monitor patient progress toward functional and medical goals. FIM: FIM - Bathing Bathing Steps Patient Completed: Chest;Right Arm;Left Arm;Abdomen;Front perineal area;Right upper leg;Left upper leg;Left lower leg (including foot) Bathing: 4: Min-Patient completes 8-9 29f 10 parts or 75+ percent  FIM - Upper Body  Dressing/Undressing Upper body dressing/undressing steps patient completed: Thread/unthread right sleeve of pullover shirt/dresss;Thread/unthread left sleeve of pullover shirt/dress;Put head through opening of pull over shirt/dress;Pull shirt over trunk Upper body dressing/undressing: 5: Set-up assist to: Obtain clothing/put away FIM - Lower Body Dressing/Undressing Lower body dressing/undressing steps patient completed: Thread/unthread left pants leg;Thread/unthread right pants leg;Pull pants up/down (declined underwear or shoe) Lower body dressing/undressing: 4: Min-Patient completed 75 plus % of tasks  FIM - Toileting Toileting steps completed by patient: Adjust clothing prior to toileting;Adjust clothing after toileting;Performs perineal hygiene Toileting Assistive Devices: Grab bar or rail for support Toileting: 4: Steadying assist  FIM - Diplomatic Services operational officer Devices: Walker (pt's br at home may not be w/c accessible ) Toilet Transfers: 4-To toilet/BSC: Min A (steadying Pt. > 75%);4-From toilet/BSC: Min A (steadying Pt. > 75%) (5 steps in and 5 steps out with walker)  FIM - Bed/Chair Transfer Bed/Chair Transfer Assistive Devices: Therapist, occupational: 4: Supine > Sit: Min A (steadying Pt. > 75%/lift 1 leg);7: Sit > Supine: No assist;5: Bed > Chair or W/C: Supervision (verbal cues/safety issues);5: Chair or W/C > Bed: Supervision (verbal cues/safety issues)  FIM - Locomotion: Wheelchair Distance: 150 Locomotion: Wheelchair: 5: Travels 150 ft or more: maneuvers on rugs and over door sills with supervision, cueing or coaxing FIM - Locomotion: Ambulation Locomotion: Ambulation Assistive Devices: Designer, industrial/product Ambulation/Gait Assistance: 4: Min assist Locomotion: Ambulation: 1: Travels less than 50 ft with minimal assistance (Pt.>75%)  Comprehension Comprehension Mode: Auditory Comprehension: 7-Follows complex conversation/direction: With no  assist  Expression Expression Mode: Verbal Expression: 7-Expresses complex ideas: With no assist  Social Interaction Social Interaction: 7-Interacts appropriately with others - No medications needed.  Problem Solving Problem Solving: 7-Solves complex problems: Recognizes & self-corrects  Memory Memory: 7-Complete Independence: No helper  Medical Problem List and Plan:  1. 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 DVT Prophylaxis/Anticoagulation: Subcutaneous Lovenox. Monitor platelet counts and any signs of bleeding  3. Pain Management: Demerol 50 mg every 4 hours as needed. Monitor mental status closely and any  signs of increased nausea  4. Mood: Xanax 0.25 mg 3 times daily as needed. Provide emotional support and positive reinforcement  5. Neuropsych: This patient is capable of making decisions on his/her own behalf.  6.Postoperative anemia .Niferex twice a day. Followup CBC  7. Coronary artery disease with PTCA . continue aspirin Plavix therapy. Patient denies any chest pain or shortness of breath  8. Diabetes mellitus with peripheral neuropathy. Latest hemoglobin A1c of 6.3 .D/C'ed Glyburide 2.5 mg daily due to hypoglycemia , trandjenta 5 mg each bedtime .Metformin/actos to qd Check blood sugars a.c. and at bedtime-no new changes until consistent pattern develops to avoid hypoglycemia 9. Diastolic congestive heart failure. Lasix 40 mg daily. Monitor for any signs of fluid overload, keep left leg elevated when possible.  i's o's are up overall since admit  -weigh pt today 10. Hypertension . imdur 60 mg daily, Altace 2.5 mg twice a day, Lopressor 50 mg twice a day  11. Constipation. Adjusted bowel program as needed add senna 12. Hypokalemia: recheck labs  today  -kdur supplement    LOS (Days) 6 A FACE TO FACE EVALUATION WAS PERFORMED  Analycia Khokhar T 05/02/2012, 7:11 AM

## 2012-05-02 NOTE — Progress Notes (Signed)
Occupational Therapy Session Notes  Patient Details  Name: [name] MRN: [id number] Date of Birth: 1945-12-26  Today's Date: 05/02/2012 Time: 1100-1200 and 145-215 Time Calculation (min): 60 min  30 min  Short Term Goals: Week 1:  OT Short Term Goal 1 (Week 1): Pt will transfer to toilet and or BSC with RW with supervision. OT Short Term Goal 2 (Week 1): Pt will dress LB with supervision. OT Short Term Goal 3 (Week 1): Pt will bathe with supervision.  Skilled Therapeutic Interventions/Progress Updates:  1)  Patient resting in bed upon arrival.  Patient scheduled for bath and dress however she reports that she already completed it. Stating she will shower with OT in the morning.  IADL retraining to include explore therapy kitchen at a w/c level using safe techniques.  When reaching forward or sideways while performing tasks while in w/c or when standing at counter.  Patient practiced using the reacher for challenging reaching and removed both leg rests to improve maneuverability while completing simple microwave task.  2)  Patient resting in w/c upon arrival and requesting to use the restroom.  Patient distant supervision for task to include w/c placement next to commode use of grab bar and arm rests of 3 in 1 over commode to improve safety.  Focused session on activity tolerance, dynamic standing balance for pants down and up.  Also discussed at length her bathroom set-up to ensure ability to use her current shower with a tub bench in place and a w/c in her bathroom for toilet transfers.  Patient also drew the basic layout of the bathroom and plans to have her husband make some measurements.  Therapy Documentation Precautions:  Precautions Precautions: Fall Precaution Comments: Limb guard Rt. LE  Restrictions Weight Bearing Restrictions: Yes Pain: Pain Assessment Pain Score: 0-No pain Faces Pain Scale: No hurt  See FIM for current functional status  Therapy/Group: Individual  Therapy  [name] 05/02/2012, 3:25 PM

## 2012-05-03 ENCOUNTER — Inpatient Hospital Stay (HOSPITAL_COMMUNITY): Payer: Medicare Other | Admitting: *Deleted

## 2012-05-03 ENCOUNTER — Inpatient Hospital Stay (HOSPITAL_COMMUNITY): Payer: Medicare Other | Admitting: Physical Therapy

## 2012-05-03 ENCOUNTER — Inpatient Hospital Stay (HOSPITAL_COMMUNITY): Payer: Medicare Other | Admitting: Occupational Therapy

## 2012-05-03 ENCOUNTER — Inpatient Hospital Stay (HOSPITAL_COMMUNITY): Payer: Medicare Other

## 2012-05-03 LAB — GLUCOSE, CAPILLARY: Glucose-Capillary: 153 mg/dL — ABNORMAL HIGH (ref 70–99)

## 2012-05-03 LAB — BASIC METABOLIC PANEL
BUN: 10 mg/dL (ref 6–23)
CO2: 28 mEq/L (ref 19–32)
Calcium: 9 mg/dL (ref 8.4–10.5)
Creatinine, Ser: 0.69 mg/dL (ref 0.50–1.10)
Glucose, Bld: 176 mg/dL — ABNORMAL HIGH (ref 70–99)

## 2012-05-03 LAB — CBC WITH DIFFERENTIAL/PLATELET
Basophils Absolute: 0 10*3/uL (ref 0.0–0.1)
Eosinophils Relative: 2 % (ref 0–5)
HCT: 25.3 % — ABNORMAL LOW (ref 36.0–46.0)
Lymphocytes Relative: 19 % (ref 12–46)
Lymphs Abs: 1.5 10*3/uL (ref 0.7–4.0)
MCV: 83 fL (ref 78.0–100.0)
Monocytes Absolute: 0.5 10*3/uL (ref 0.1–1.0)
RDW: 18 % — ABNORMAL HIGH (ref 11.5–15.5)
WBC: 7.9 10*3/uL (ref 4.0–10.5)

## 2012-05-03 LAB — CREATININE, SERUM
GFR calc Af Amer: >90 mL/min (ref >90)
GFR calc non Af Amer: >90 mL/min (ref >90)

## 2012-05-03 NOTE — Progress Notes (Signed)
Dressing due to be changed today however, pt request that it not be changed due to the possibility the PICC line may come out tomorrow (05/04/12). Site is unremarkable, advised pt that we could delay the change one day, however it will need to be changed tomorrow if it is not d/c'ed.  Pt agreed.  Consuello Masse

## 2012-05-03 NOTE — Progress Notes (Signed)
Physical Therapy Session Note  Patient Details  Name: [name] MRN: [id number] Date of Birth: 03-Nov-1945  Today's Date: 05/03/2012 Time: 1106-1200 Time Calculation (min): 54 min  Short Term Goals: Week 2:  PT Short Term Goal 1 (Week 2): STGs=LTGs  Skilled Therapeutic Interventions/Progress Updates:    Pt participated in group exercise program consisting of B LE strengthening AROM.  Pt propelled w/c with BUE's x 150 with 3 rest breaks with supervision.  Therapy Documentation Precautions:  Precautions Precautions: Fall Precaution Comments: Limb guard Rt. LE  Restrictions Weight Bearing Restrictions: Yes Pain: Pain Assessment Pain Assessment: 0-10 Pain Score:   3 Pain Type: Surgical pain Pain Location: Leg Pain Orientation: Right Pain Intervention(s): Repositioned;Ambulation/increased activity (pt reports she has had medication)      See FIM for current functional status  Therapy/Group: Group Therapy  [name] 05/03/2012, 12:38 PM

## 2012-05-03 NOTE — Progress Notes (Signed)
Patient ID: [name], female   DOB: [birth year], 66 y.o.   MRN: [id number] Comfortable, good spirits. Progressing well in rehabilitation. Some dressing just changed 10 minutes ago by Dr. Hermelinda Medicus. Discuss this with him. He reports healing nicely.

## 2012-05-03 NOTE — Progress Notes (Signed)
Patient ID: [name], female   DOB: 30-Jun-1946, 66 y.o.   MRN: [id number]  Subjective/Complaints: Stump pain at times, but it's manageable still. Overall doing quite well.  Review of Systems  Musculoskeletal: Positive for joint pain.  Psychiatric/Behavioral: The patient is nervous/anxious. The patient does not have insomnia.    Objective: Vital Signs: Blood pressure 127/76, pulse 80, temperature 98.4 F (36.9 C), temperature source Oral, resp. rate 18, weight 65.454 kg (144 lb 4.8 oz), SpO2 96.00%. No results found. Results for orders placed during the hospital encounter of 04/26/12 (from the past 72 hour(s))  GLUCOSE, CAPILLARY     Status: Abnormal   Collection Time   04/30/12  7:17 AM      Component Value Range Comment   Glucose-Capillary 165 (*) 70 - 99 mg/dL    Comment 1 Notify RN     GLUCOSE, CAPILLARY     Status: Abnormal   Collection Time   04/30/12 11:33 AM      Component Value Range Comment   Glucose-Capillary 175 (*) 70 - 99 mg/dL    Comment 1 Notify RN     GLUCOSE, CAPILLARY     Status: Abnormal   Collection Time   04/30/12  4:56 PM      Component Value Range Comment   Glucose-Capillary 214 (*) 70 - 99 mg/dL    Comment 1 Documented in Chart      Comment 2 Notify RN     GLUCOSE, CAPILLARY     Status: Abnormal   Collection Time   04/30/12  9:32 PM      Component Value Range Comment   Glucose-Capillary 115 (*) 70 - 99 mg/dL    Comment 1 Notify RN     GLUCOSE, CAPILLARY     Status: Abnormal   Collection Time   05/01/12  7:15 AM      Component Value Range Comment   Glucose-Capillary 141 (*) 70 - 99 mg/dL    Comment 1 Notify RN     GLUCOSE, CAPILLARY     Status: Abnormal   Collection Time   05/01/12 11:56 AM      Component Value Range Comment   Glucose-Capillary 135 (*) 70 - 99 mg/dL    Comment 1 Notify RN     GLUCOSE, CAPILLARY     Status: Abnormal   Collection Time   05/01/12  4:59 PM      Component Value Range Comment   Glucose-Capillary 225 (*) 70  - 99 mg/dL   GLUCOSE, CAPILLARY     Status: Abnormal   Collection Time   05/01/12 10:42 PM      Component Value Range Comment   Glucose-Capillary 121 (*) 70 - 99 mg/dL   GLUCOSE, CAPILLARY     Status: Abnormal   Collection Time   05/02/12  7:15 AM      Component Value Range Comment   Glucose-Capillary 151 (*) 70 - 99 mg/dL    Comment 1 Notify RN     GLUCOSE, CAPILLARY     Status: Abnormal   Collection Time   05/02/12 11:59 AM      Component Value Range Comment   Glucose-Capillary 132 (*) 70 - 99 mg/dL    Comment 1 Notify RN     GLUCOSE, CAPILLARY     Status: Abnormal   Collection Time   05/02/12  4:34 PM      Component Value Range Comment   Glucose-Capillary 152 (*) 70 - 99 mg/dL    Comment  1 Notify RN     GLUCOSE, CAPILLARY     Status: Abnormal   Collection Time   05/02/12  8:47 PM      Component Value Range Comment   Glucose-Capillary 176 (*) 70 - 99 mg/dL    Comment 1 Notify RN     CREATININE, SERUM     Status: Normal   Collection Time   05/03/12  5:47 AM      Component Value Range Comment   Creatinine, Ser 0.64  0.50 - 1.10 mg/dL    GFR calc non Af Amer >90  >90 mL/min    GFR calc Af Amer >90  >90 mL/min     HEENT: normal Cardio: RRR Resp: CTA B/L GI: BS positive Extremity:  No Edema Skin:   Other R BKA  ACE  Neuro: Alert/Oriented Musc/Skel:  Other Able to flex R hip , abd and add. Left leg NVI. Right BK wound is well approximated and looks good, without drainage. Gen ;NAD  Assessment/Plan: 1. Functional deficits secondary to R BKA due to PVD associated with DM which require 3+ hours per day of interdisciplinary therapy in a comprehensive inpatient rehab setting. Physiatrist is providing close team supervision and 24 hour management of active medical problems listed below. Physiatrist and rehab team continue to assess barriers to discharge/monitor patient progress toward functional and medical goals. FIM: FIM - Bathing Bathing Steps Patient Completed:  Chest;Right Arm;Left Arm;Abdomen;Front perineal area;Right upper leg;Left upper leg;Left lower leg (including foot) Bathing: 4: Min-Patient completes 8-9 22f 10 parts or 75+ percent  FIM - Upper Body Dressing/Undressing Upper body dressing/undressing steps patient completed: Thread/unthread right sleeve of pullover shirt/dresss;Thread/unthread left sleeve of pullover shirt/dress;Put head through opening of pull over shirt/dress;Pull shirt over trunk Upper body dressing/undressing: 5: Set-up assist to: Obtain clothing/put away FIM - Lower Body Dressing/Undressing Lower body dressing/undressing steps patient completed: Thread/unthread left pants leg;Thread/unthread right pants leg;Pull pants up/down (declined underwear or shoe) Lower body dressing/undressing: 4: Min-Patient completed 75 plus % of tasks  FIM - Toileting Toileting steps completed by patient: Adjust clothing prior to toileting;Adjust clothing after toileting;Performs perineal hygiene Toileting Assistive Devices: Grab bar or rail for support Toileting: 4: Steadying assist  FIM - Diplomatic Services operational officer Devices: Walker (pt's br at home may not be w/c accessible ) Toilet Transfers: 4-To toilet/BSC: Min A (steadying Pt. > 75%);4-From toilet/BSC: Min A (steadying Pt. > 75%) (5 steps in and 5 steps out with walker)  FIM - Banker Devices: Walker;Arm rests Bed/Chair Transfer: 5: Supine > Sit: Supervision (verbal cues/safety issues);5: Sit > Supine: Supervision (verbal cues/safety issues);4: Bed > Chair or W/C: Min A (steadying Pt. > 75%);5: Chair or W/C > Bed: Supervision (verbal cues/safety issues) (one LOB with squat transfer requiring min assist today)  FIM - Locomotion: Wheelchair Distance: 150 Locomotion: Wheelchair: 5: Travels 150 ft or more: maneuvers on rugs and over door sills with supervision, cueing or coaxing FIM - Locomotion: Ambulation Locomotion: Ambulation  Assistive Devices: Designer, industrial/product Ambulation/Gait Assistance: 4: Min assist Locomotion: Ambulation: 0: Activity did not occur  Comprehension Comprehension Mode: Auditory Comprehension: 7-Follows complex conversation/direction: With no assist  Expression Expression Mode: Verbal Expression: 7-Expresses complex ideas: With no assist  Social Interaction Social Interaction: 7-Interacts appropriately with others - No medications needed.  Problem Solving Problem Solving: 7-Solves complex problems: Recognizes & self-corrects  Memory Memory: 7-Complete Independence: No helper  Medical Problem List and Plan:  1. Right BKA secondary to peripheral vascular  disease with multiple revascularization procedures/limb guard provided by [employer]   -continue local care, protection, added oil emersion dressing to decrease adherence of dressing to wound  -edema mgt left leg 2. DVT Prophylaxis/Anticoagulation: Subcutaneous Lovenox. Monitor platelet counts and any signs of bleeding  3. Pain Management: Demerol 50 mg every 4 hours as needed. Monitor mental status closely and any signs of increased nausea  4. Mood: Xanax 0.25 mg 3 times daily as needed. Provide emotional support and positive reinforcement  5. Neuropsych: This patient is capable of making decisions on his/her own behalf.  6.Postoperative anemia .Niferex twice a day. Followup CBC  7. Coronary artery disease with PTCA . continue aspirin Plavix therapy. Patient denies any chest pain or shortness of breath  8. Diabetes mellitus with peripheral neuropathy. Latest hemoglobin A1c of 6.3 .D/C'ed Glyburide 2.5 mg daily due to hypoglycemia , trandjenta 5 mg each bedtime .Metformin/actos to qd Check blood sugars a.c. and at bedtime-no new changes until consistent pattern develops to avoid hypoglycemia 9. Diastolic congestive heart failure. Lasix 40 mg daily. Monitor for any signs of fluid overload, keep left leg elevated when possible.  i's  o's are up overall since admit  -weigh pt today 10. Hypertension . imdur 60 mg daily, Altace 2.5 mg twice a day, Lopressor 50 mg twice a day  11. Constipation. Adjusted bowel program as needed add senna 12. Hypokalemia: recheck labs   In am  -kdur supplement    LOS (Days) 7 A FACE TO FACE EVALUATION WAS PERFORMED  Davone Shinault T 05/03/2012, 7:11 AM

## 2012-05-03 NOTE — Plan of Care (Signed)
Problem: RH BLADDER ELIMINATION Goal: RH STG MANAGE BLADDER WITH ASSISTANCE STG Manage Bladder With Assistance. Mod I Outcome: Progressing No incontinence episode reported.

## 2012-05-03 NOTE — Progress Notes (Signed)
INITIAL ADULT NUTRITION ASSESSMENT Date: 05/03/2012   Time: 11:42 AM  Reason for Assessment: Malnutrition Screening  INTERVENTION: 1. Discontinue Glucerna Shakes 2. Discussed carbohydrate counting and meal intake 3. RD to continue to follow nutrition care plan  DOCUMENTATION CODES Per approved criteria  -Severe malnutrition in the context of acute illness or injury   ASSESSMENT: Female 66 y.o.  Dx: S/P BKA (below knee amputation)  Hx:  Past Medical History  Diagnosis Date  . Hypertension     Unspecified  . Hyperlipidemia     Mixed  . Tobacco abuse     Remote  . Loose, teeth     has loose bridge and two loose teeth holding it  . PONV (postoperative nausea and vomiting)   . Type II diabetes mellitus   . GERD (gastroesophageal reflux disease)   . Anxiety   . Peripheral vascular disease     unspecified, a. s/p L CEA b. s/p B fem-pop bypass  . Raynaud disease   . Stones in the urinary tract   . UTI (urinary tract infection)   . Anemia     hx  . Coronary artery disease     a. s/p NSTEMI 02/2009 - PCI LCX with Xience DES. Otherwise branch vessel and Dist. RCA dzs. NL EF.   Marland Kitchen Myocardial infarction   . CHF (congestive heart failure)   . Kidney stones   . UTI (lower urinary tract infection)   . Constipation   . Hernia, umbilical   . History of blood transfusion    Past Surgical History  Procedure Date  . Vesicovaginal fistula closure w/ tah   . Colonoscopy 11/2010  . Upper gastrointestinal endoscopy 11/2010  . Femoral artery - popliteal artery bypass graft     left  . Abdominal hysterectomy     complete  . Femoral-popliteal bypass graft 08/22/2011    Procedure: BYPASS GRAFT FEMORAL-POPLITEAL ARTERY;  Surgeon: Gretta Began, MD;  Location: [facility] OR;  Service: Vascular;  Laterality: Right;  Thrombectomy and Revision using 7mm x 10cm stretch goretex graft  . Intraoperative arteriogram 08/22/2011    Procedure: INTRA OPERATIVE ARTERIOGRAM;  Surgeon: Gretta Began, MD;  Location: [facility]  OR;  Service: Vascular;  Laterality: Right;  to lower leg  . Multiple tooth extractions 08-29-2011    5 teeth extracted   . Pci 01/17/12    RLE  . Breast lumpectomy     right  . Tonsillectomy and adenoidectomy   . Coronary angioplasty with stent placement   . Carotid endarterectomy ~ 2005    left  . Aorta - bilateral femoral artery bypass graft 03/26/2012    Procedure: AORTA BIFEMORAL BYPASS GRAFT;  Surgeon: Larina Earthly, MD;  Location: [facility] OR;  Service: Vascular;  Laterality: N/A;  Aortic-bifemoral bypass using 14x60mm Hemashield graft .   . Umbilical hernia repair 03/26/2012    Procedure: HERNIA REPAIR UMBILICAL ADULT;  Surgeon: Larina Earthly, MD;  Location: [facility] OR;  Service: Vascular;  Laterality: N/A;  Removal of Umbilical hernia sac  . Femoral artery exploration 03/26/2012    Procedure: FEMORAL ARTERY EXPLORATION;  Surgeon: Larina Earthly, MD;  Location: [facility] OR;  Service: Vascular;  Laterality: Right;  with Revision of Popliteal-Peroneal bypass graft using 6mm x 10cm thin wall goretex graft  . Amputation 04/23/2012    Procedure: AMPUTATION BELOW KNEE;  Surgeon: Larina Earthly, MD;  Location: [facility] OR;  Service: Vascular;  Laterality: Right;   Related Meds:     . aspirin  325  mg Oral q morning - 10a  . clopidogrel  75 mg Oral Daily  . dexlansoprazole  60 mg Oral BID  . enoxaparin (LOVENOX) injection  40 mg Subcutaneous Q24H  . feeding supplement  237 mL Oral Q1500  . furosemide  40 mg Oral Daily  . insulin aspart  0-15 Units Subcutaneous TID WC  . iron polysaccharides  150 mg Oral BID  . isosorbide mononitrate  60 mg Oral Daily  . linagliptin  5 mg Oral QHS  . metoprolol  50 mg Oral BID  . pioglitazone-metformin  1 tablet Oral Q breakfast  . potassium chloride  20 mEq Oral TID  . ramipril  1.25 mg Oral BID  . senna-docusate  2 tablet Oral BID  . sodium chloride  10-40 mL Intracatheter Q12H  . Vitamin D (Ergocalciferol)  50,000 Units Oral Q7 days  . DISCONTD: metFORMIN  500 mg Oral Q  breakfast  . DISCONTD: pioglitazone  15 mg Oral Q breakfast   Ht:  5\' 4"  (162.6 cm)  Wt: 144 lb 4.8 oz (65.454 kg)  Adjusted Ideal Wt for BKA:    51.2 kg % Ideal Wt: 128%  Wt Readings from Last 15 Encounters:  05/02/12 144 lb 4.8 oz (65.454 kg)  04/23/12 142 lb 1.6 oz (64.456 kg)  04/23/12 142 lb 1.6 oz (64.456 kg)  04/17/12 149 lb 8 oz (67.813 kg)  04/09/12 154 lb 12.2 oz (70.2 kg)  04/09/12 154 lb 12.2 oz (70.2 kg)  04/09/12 154 lb 12.2 oz (70.2 kg)  03/21/12 160 lb 9.6 oz (72.848 kg)  03/20/12 156 lb 11.2 oz (71.079 kg)  02/22/12 156 lb (70.761 kg)  02/21/12 158 lb (71.668 kg)  01/18/12 160 lb 8 oz (72.802 kg)  01/18/12 160 lb 8 oz (72.802 kg)  01/10/12 158 lb (71.668 kg)  11/22/11 158 lb 12.8 oz (72.031 kg)  Usual Wt: 162 lb % Usual Wt: 89%  BMI is 26.3 - using adjusted wt  Labs:  CMP     Component Value Date/Time   NA 135 04/28/2012 0500   K 3.2* 04/28/2012 0500   CL 97 04/28/2012 0500   CO2 30 04/28/2012 0500   GLUCOSE 95 04/28/2012 0500   BUN 7 04/28/2012 0500   CREATININE 0.64 05/03/2012 0547   CALCIUM 8.8 04/28/2012 0500   PROT 5.6* 04/27/2012 0500   ALBUMIN 2.1* 04/27/2012 0500   AST 23 04/27/2012 0500   ALT 15 04/27/2012 0500   ALKPHOS 73 04/27/2012 0500   BILITOT 0.2* 04/27/2012 0500   GFRNONAA >90 05/03/2012 0547   GFRAA >90 05/03/2012 0547    Intake/Output Summary (Last 24 hours) at 05/03/12 1146 Last data filed at 05/03/12 0900  Gross per 24 hour  Intake    720 ml  Output      0 ml  Net    720 ml   Diet Order: Carb Control Medium (1600 - 2000)  Supplements/Tube Feeding: Glucerna Shake PO daily  IVF:    Estimated Nutritional Needs:   Kcal:  1600 - 1800 kcal Protein:  85 - 95 grams protein Fluid:  1.8 - 2 liters daily  Pt followed by RD staff during acute hospitalization. Pt underwent BKA on 10/21. Per RD assessment, pt had poor oral intake (0-50%) during previous hospital course, however, at this time states her appetite is "getting  better every day;" pt was ordered for Glucerna Shakes during acute hospitalization, however she is currently refusing. Will discontinue these supplements at this time. Pt is currently  eating 50 - 100%.  Patient meets criteria for severe malnutrition in the context of acute illness or injury given < 50% intake of estimated energy requirement for > 5 days and 14% weight loss x 1 month.  Briefly discussed carbohydrate counting with patient and husband.  NUTRITION DIAGNOSIS: Increased nutrient needs r/t post-op healing AEB estimated nutrition needs.  MONITORING/EVALUATION(Goals): Goal: Pt to meet >/= 90% of their estimated nutrition needs Monitor: weight trends, lab trends, I/O's, PO intake, supplement tolerance  EDUCATION NEEDS: -No education needs identified at this time  Jarold Motto MS, RD, LDN Pager: [phone] After-hours pager: [phone]

## 2012-05-03 NOTE — Plan of Care (Signed)
Problem: RH SAFETY Goal: RH STG ADHERE TO SAFETY PRECAUTIONS W/ASSISTANCE/DEVICE STG Adhere to Safety Precautions With Assistance/Device. Supervision  Outcome: Progressing Calls appropriately for toileting assist

## 2012-05-03 NOTE — Significant Event (Signed)
Writer called to pt's room by nurse tech at 12:30. Bandage on LUQ saturated with blood. Tech reported that when bandaged was removed, blood squirted from LUQ, and tech unable to stop site from bleeding. Pt currently on Lovenox. Pressure applied to the area with 4x4. Bleeding subsided within a short period of time. Dry dressing placed to area for monitoring. Pt's husband in room at time of incident. Husband states that he would like Lovenox discontinued. Deatra Ina, PA made aware. PA spoke to family regarding the purpose of Lovenox, and stated that he would discuss with MD to determine when Lovenox could be discontinued. Staff to intermittently monitor site for new onset of bleeding.

## 2012-05-03 NOTE — Progress Notes (Signed)
Occupational Therapy Session Note  Patient Details  Name: [name] MRN: [id number] Date of Birth: [birth year]  Today's Date: 05/03/2012 Time: 0800-0900 Time Calculation (min): 60 min  Short Term Goals: Week 1:  OT Short Term Goal 1 (Week 1): Pt will transfer to toilet and or BSC with RW with supervision. OT Short Term Goal 2 (Week 1): Pt will dress LB with supervision. OT Short Term Goal 3 (Week 1): Pt will bathe with supervision.  Skilled Therapeutic Interventions/Progress Updates:  Patient resting in bed upon arrival and declined shower because she is concerned about her residual limb healing so she does not have to go through an additional amputation.  MD and this clinician assured her that we would waterproof her limb so as not to get it wet and she still declined for today, "maybe another day".  Self care retraining to include bed>w/c transfer, sponge bath, dress, groom and toilet transfer.  Session focused on activity tolerance, dynamic standing balance, and safe transfers.  Patient often requires extra encouragement to perform a challenging task and often declines use of walker because she likes the safety of the w/c.  Reinforced need for LLE to be strong and her activity tolerance and balance in good shape to prepare for prosthetic.  Therapy Documentation Precautions:  Precautions Precautions: Fall Precaution Comments: Limb guard Rt. LE  Restrictions Weight Bearing Restrictions: Yes Pain: Denies pain See FIM for current functional status  Therapy/Group: Individual Therapy  Pasty Manninen 05/03/2012, 11:19 AM

## 2012-05-03 NOTE — Progress Notes (Signed)
Physical Therapy Note  Patient Details  Name: [name] MRN: [id number] Date of Birth: 11/05/1945 Today's Date: 05/03/2012  Pt refused PM session due to being concerned about SOB and lightheadedness.  She stated MD is aware and has ordered a chest x-ray.  I educated her that we have equipment that can be used to monitor both her breathing and her BP while we are working together in [employer]  She preferred to wait until tomorrow.  30 mins missed.   Rollene Rotunda Averyana Pillars, PT, DPT [phone]   05/03/2012, 3:51 PM

## 2012-05-03 NOTE — Progress Notes (Signed)
Physical Therapy Weekly Progress Note  Patient Details  Name: [name] MRN: [id number] Date of Birth: April 29, 1946  Today's Date: 05/03/2012 Time: 0901-1000 Time Calculation (min): 59 min  Patient has met 4 of 4 short term goals.    Patient continues to demonstrate the following deficits: decreased dynamic standing balance, decreased endurance, decreased arm and leg strength, tight hip flexors and therefore will continue to benefit from skilled PT intervention to enhance overall performance with activity tolerance, balance and ability to compensate for deficits.  Patient progressing toward long term goals..  Continue plan of care.  PT Short Term Goals Week 2:  PT Short Term Goal 1 (Week 2): STGs=LTGs  Skilled Therapeutic Interventions/Progress Updates:    This session focused on transfers both squat pivot and stand pivot with RW and without supervision with minimal verbal cues needed for hand placement and safe WC set up.  WC mobility (further today) over tile and carpet for increased endurance training, increased arm strengthening with supervision.  Pt continues to need multiple rest breaks (3-4) during WC mobility.  She also needs assist managing bil leg rests and husband has shown independence in assisting her with this task.  Standing neuro re-ed working on balance and standing tolerance at the parallel bars stacking cones on a table with one arm assist and then reaching for cones and stacking them as I moved the table further away.  Multiple trials due to leg and arm fatigue pt needed sitting rest breaks between sets of two cone stacks.    Therapy Documentation Precautions:  Precautions Precautions: Fall Precaution Comments: Limb guard Rt. LE  Restrictions Weight Bearing Restrictions: Yes General:   Vital Signs:   Pain: Pain Assessment Pain Assessment: 0-10 Pain Score:   3 Pain Type: Surgical pain Pain Location: Leg Pain Orientation: Right Pain Intervention(s):  Repositioned;Ambulation/increased activity (pt reports she has had medication) Mobility:   Locomotion : Wheelchair Mobility Distance: 200   See FIM for current functional status  Therapy/Group: Individual Therapy  [name] Joiner B. Jasmond River, PT, DPT [phone]   05/03/2012, 12:19 PM

## 2012-05-03 NOTE — Plan of Care (Signed)
Problem: RH SKIN INTEGRITY Goal: RH STG MAINTAIN SKIN INTEGRITY WITH ASSISTANCE STG Maintain Skin Integrity With min Assistance.  Outcome: Not Progressing Still require instructions regarding skin care.

## 2012-05-04 ENCOUNTER — Inpatient Hospital Stay (HOSPITAL_COMMUNITY): Payer: Medicare Other | Admitting: Physical Therapy

## 2012-05-04 ENCOUNTER — Inpatient Hospital Stay (HOSPITAL_COMMUNITY): Payer: Medicare Other | Admitting: Occupational Therapy

## 2012-05-04 DIAGNOSIS — L98499 Non-pressure chronic ulcer of skin of other sites with unspecified severity: Secondary | ICD-10-CM

## 2012-05-04 DIAGNOSIS — I739 Peripheral vascular disease, unspecified: Secondary | ICD-10-CM

## 2012-05-04 DIAGNOSIS — S78119A Complete traumatic amputation at level between unspecified hip and knee, initial encounter: Secondary | ICD-10-CM

## 2012-05-04 LAB — GLUCOSE, CAPILLARY
Glucose-Capillary: 162 mg/dL — ABNORMAL HIGH (ref 70–99)
Glucose-Capillary: 191 mg/dL — ABNORMAL HIGH (ref 70–99)
Glucose-Capillary: 123 mg/dL — ABNORMAL HIGH (ref 70–99)
Glucose-Capillary: 216 mg/dL — ABNORMAL HIGH (ref 70–99)

## 2012-05-04 MED ORDER — FUROSEMIDE 40 MG PO TABS
40.0000 mg | ORAL_TABLET | Freq: Two times a day (BID) | ORAL | Status: DC
  Administered 2012-05-04 – 2012-05-07 (×6): 40 mg via ORAL
  Filled 2012-05-04 (×8): qty 1

## 2012-05-04 NOTE — Progress Notes (Addendum)
Physical Therapy Session Note  Patient Details  Name: [name] MRN: [id number] Date of Birth: Oct 09, 1945  Today's Date: 05/04/2012 Time: Session #1: 4098-1191, Session #2: 1329-14:06 Time Calculation (min): Session #1: 59 min, Session #2: 37 min  Short Term Goals: Week 1:  PT Short Term Goal 1 (Week 1): Pt will perform sit <> stand with supervision.  PT Short Term Goal 1 - Progress (Week 1): Met PT Short Term Goal 2 (Week 1): Pt will perform bed <> chair transfer with min assist.  PT Short Term Goal 2 - Progress (Week 1): Met PT Short Term Goal 3 (Week 1): Pt will perform 15' ambulation with moderate assist PT Short Term Goal 3 - Progress (Week 1): Met PT Short Term Goal 4 (Week 1): Pt will verbalize complience with developing HEP outside of therapy session. PT Short Term Goal 4 - Progress (Week 1): Met  Skilled Therapeutic Interventions/Progress Updates:    Session #1:This session focused on WC mobility for endurance and over various surfaces both tile and carpet, due to pt has mostly carpet in her home 200' supervision, 2 rest breaks needed.  Transfers scoot pivot from WC<-> mat table mod I.  Sit <-> stand from [facility] supervision.  TE on mat table: bridges x10 left side, SLR, hip abduction, glute sets x 10 each right, single knee to chest and double knee to chest back stretches 2 x 20 sec each bil, S/L hip abduction and ext x 10 each right. Standing TE: hip ext, abduct, felx x 10 each, minisquats x 10.  Pt need fewer rest breaks today showing that her endurance is improving.      Session #2: This session focused on gait training with RW with WC to follow 10'x1, 10'x1, 5'x1 with min assist.  Stairs  2 steps x 2 with bil handrails (6" steps) max assist with therapist guarding at pt's waist to support her weight when hopping.  Going up the stairs therapist guarding with my thigh under her right thigh/buttocks for extra support.  Will need to train husband on this method before discharge.  WC  mobility mod I 150', still needs assist to put on leg rests, pt can take them off independently, but was unable to get even the lighter left one on without assist.    Therapy Documentation Precautions:  Precautions Precautions: Fall Precaution Comments: Limb guard Rt. LE  Restrictions Weight Bearing Restrictions: Yes General:   Vital Signs: Therapy Vitals Pulse Rate: 93  BP: 106/65 mmHg Pain: Pain Assessment Pain Assessment: No/denies pain Mobility:   Locomotion : Wheelchair Mobility Distance: 200   See FIM for current functional status  Therapy/Group: Individual Therapy  [name], PT, DPT [phone]   05/04/2012, 12:46 PM

## 2012-05-04 NOTE — Progress Notes (Signed)
Physical Therapy Session Note  Patient Details  Name: [name] MRN: [id number] Date of Birth: 17-Oct-1945  Today's Date: 05/04/2012 Time: 0730-0813 Time Calculation (min): 43 min  Short Term Goals: Week 1:  PT Short Term Goal 1 (Week 1): Pt will perform sit <> stand with supervision.  PT Short Term Goal 1 - Progress (Week 1): Met PT Short Term Goal 2 (Week 1): Pt will perform bed <> chair transfer with min assist.  PT Short Term Goal 2 - Progress (Week 1): Met PT Short Term Goal 3 (Week 1): Pt will perform 15' ambulation with moderate assist PT Short Term Goal 3 - Progress (Week 1): Met PT Short Term Goal 4 (Week 1): Pt will verbalize complience with developing HEP outside of therapy session. PT Short Term Goal 4 - Progress (Week 1): Met  Skilled Therapeutic Interventions/Progress Updates:    Session primarily focused on stair training. Practiced on 4" steps (3) with bil. Railing, moderate assist. Progressed to 6" steps (2) with bil. Railing, up to max assist. Pt required therapist's lower extremity under Rt. Buttock/upper thigh for support to advance Lt. LE to next step. Gait 3 x 15'  reps with pt monitoring fatigue and when she needed to head for chair prior to becoming over fatigued. Wheelchair pushups with facilitation and cues for scapular depression and slight retraction 3 x 10 reps, cues for decreased Lt. LE reliance.   Therapy Documentation Precautions:  Precautions Precautions: Fall Precaution Comments: Limb guard Rt. LE  Restrictions Weight Bearing Restrictions: Yes Pain: Pain Assessment Pain Assessment: 0-10 Pain Score:   1 Pain Type: Surgical pain Pain Location: Leg Pain Orientation: Right Pain Descriptors: Aching Pain Onset: On-going Pain Intervention(s): Repositioned  See FIM for current functional status  Therapy/Group: Individual Therapy  Wilhemina Bonito 05/04/2012, 9:14 AM

## 2012-05-04 NOTE — Progress Notes (Signed)
Patient ID: [name], female   DOB: January 04, 1946, 66 y.o.   MRN: [id number]  Subjective/Complaints: Stump pain at times, but it's manageable still. Overall doing quite well.  Review of Systems  Musculoskeletal: Positive for joint pain.  Psychiatric/Behavioral: The patient is nervous/anxious. The patient does not have insomnia.    Objective: Vital Signs: Blood pressure 156/83, pulse 85, temperature 98.2 F (36.8 C), temperature source Oral, resp. rate 18, weight 64.9 kg (143 lb 1.3 oz), SpO2 98.00%. Dg Chest 2 View  05/03/2012  *RADIOLOGY REPORT*  Clinical Data: CHF  CHEST - 2 VIEW  Comparison: Prior chest x-ray 04/08/2012  Findings: Left upper extremity PICC.  The catheter tip projects over the superior cavoatrial junction.  The lungs are well aerated and clear.  No pulmonary edema, pleural effusion, pneumothorax or focal airspace consolidation.  No suspicious pulmonary nodule. Mild cardiomegaly remains unchanged.  Aortic atherosclerosis noted. Regions of dense sclerosis in the greater trochanter of the left humerus also remain unchanged.  IMPRESSION:  1.  No acute cardiopulmonary disease. Specifically no evidence of pulmonary edema.  2.  Stable cardiomegaly.   Original Report Authenticated By: Malachy Moan, M.D.    Results for orders placed during the hospital encounter of 04/26/12 (from the past 72 hour(s))  GLUCOSE, CAPILLARY     Status: Abnormal   Collection Time   05/01/12 11:56 AM      Component Value Range Comment   Glucose-Capillary 135 (*) 70 - 99 mg/dL    Comment 1 Notify RN     GLUCOSE, CAPILLARY     Status: Abnormal   Collection Time   05/01/12  4:59 PM      Component Value Range Comment   Glucose-Capillary 225 (*) 70 - 99 mg/dL   GLUCOSE, CAPILLARY     Status: Abnormal   Collection Time   05/01/12 10:42 PM      Component Value Range Comment   Glucose-Capillary 121 (*) 70 - 99 mg/dL   GLUCOSE, CAPILLARY     Status: Abnormal   Collection Time   05/02/12  7:15 AM        Component Value Range Comment   Glucose-Capillary 151 (*) 70 - 99 mg/dL    Comment 1 Notify RN     GLUCOSE, CAPILLARY     Status: Abnormal   Collection Time   05/02/12 11:59 AM      Component Value Range Comment   Glucose-Capillary 132 (*) 70 - 99 mg/dL    Comment 1 Notify RN     GLUCOSE, CAPILLARY     Status: Abnormal   Collection Time   05/02/12  4:34 PM      Component Value Range Comment   Glucose-Capillary 152 (*) 70 - 99 mg/dL    Comment 1 Notify RN     GLUCOSE, CAPILLARY     Status: Abnormal   Collection Time   05/02/12  8:47 PM      Component Value Range Comment   Glucose-Capillary 176 (*) 70 - 99 mg/dL    Comment 1 Notify RN     CREATININE, SERUM     Status: Normal   Collection Time   05/03/12  5:47 AM      Component Value Range Comment   Creatinine, Ser 0.64  0.50 - 1.10 mg/dL    GFR calc non Af Amer >90  >90 mL/min    GFR calc Af Amer >90  >90 mL/min   GLUCOSE, CAPILLARY     Status: Abnormal   Collection  Time   05/03/12  7:11 AM      Component Value Range Comment   Glucose-Capillary 153 (*) 70 - 99 mg/dL    Comment 1 Notify RN     GLUCOSE, CAPILLARY     Status: Abnormal   Collection Time   05/03/12 11:14 AM      Component Value Range Comment   Glucose-Capillary 166 (*) 70 - 99 mg/dL    Comment 1 Notify RN     GLUCOSE, CAPILLARY     Status: Abnormal   Collection Time   05/03/12  4:34 PM      Component Value Range Comment   Glucose-Capillary 228 (*) 70 - 99 mg/dL    Comment 1 Notify RN     CBC WITH DIFFERENTIAL     Status: Abnormal   Collection Time   05/03/12  6:20 PM      Component Value Range Comment   WBC 7.9  4.0 - 10.5 K/uL    RBC 3.05 (*) 3.87 - 5.11 MIL/uL    Hemoglobin 8.2 (*) 12.0 - 15.0 g/dL    HCT 30.8 (*) 65.7 - 46.0 %    MCV 83.0  78.0 - 100.0 fL    MCH 26.9  26.0 - 34.0 pg    MCHC 32.4  30.0 - 36.0 g/dL    RDW 84.6 (*) 96.2 - 15.5 %    Platelets 468 (*) 150 - 400 K/uL    Neutrophils Relative 73  43 - 77 %    Neutro Abs 5.8  1.7  - 7.7 K/uL    Lymphocytes Relative 19  12 - 46 %    Lymphs Abs 1.5  0.7 - 4.0 K/uL    Monocytes Relative 6  3 - 12 %    Monocytes Absolute 0.5  0.1 - 1.0 K/uL    Eosinophils Relative 2  0 - 5 %    Eosinophils Absolute 0.1  0.0 - 0.7 K/uL    Basophils Relative 0  0 - 1 %    Basophils Absolute 0.0  0.0 - 0.1 K/uL   BASIC METABOLIC PANEL     Status: Abnormal   Collection Time   05/03/12  6:20 PM      Component Value Range Comment   Sodium 130 (*) 135 - 145 mEq/L    Potassium 4.0  3.5 - 5.1 mEq/L    Chloride 91 (*) 96 - 112 mEq/L    CO2 28  19 - 32 mEq/L    Glucose, Bld 176 (*) 70 - 99 mg/dL    BUN 10  6 - 23 mg/dL    Creatinine, Ser 9.52  0.50 - 1.10 mg/dL    Calcium 9.0  8.4 - 84.1 mg/dL    GFR calc non Af Amer 88 (*) >90 mL/min    GFR calc Af Amer >90  >90 mL/min   GLUCOSE, CAPILLARY     Status: Normal   Collection Time   05/03/12  9:04 PM      Component Value Range Comment   Glucose-Capillary 83  70 - 99 mg/dL    Comment 1 Notify RN       HEENT: normal Cardio: RRR Resp: CTA B/L GI: BS positive Extremity:  No Edema Skin:   Other R BKA  ACE  Neuro: Alert/Oriented Musc/Skel:  Other Able to flex R hip , abd and add. Left leg NVI. Right BK wound is well approximated and looks good, without drainage. Gen ;NAD  Assessment/Plan: 1. Functional deficits  secondary to R BKA due to PVD associated with DM which require 3+ hours per day of interdisciplinary therapy in a comprehensive inpatient rehab setting. Physiatrist is providing close team supervision and 24 hour management of active medical problems listed below. Physiatrist and rehab team continue to assess barriers to discharge/monitor patient progress toward functional and medical goals. FIM: FIM - Bathing Bathing Steps Patient Completed: Chest;Right Arm;Left Arm;Abdomen;Front perineal area;Right upper leg;Left upper leg;Left lower leg (including foot) Bathing: 4: Min-Patient completes 8-9 33f 10 parts or 75+ percent  FIM -  Upper Body Dressing/Undressing Upper body dressing/undressing steps patient completed: Thread/unthread right sleeve of pullover shirt/dresss;Thread/unthread left sleeve of pullover shirt/dress;Put head through opening of pull over shirt/dress;Pull shirt over trunk Upper body dressing/undressing: 5: Set-up assist to: Obtain clothing/put away FIM - Lower Body Dressing/Undressing Lower body dressing/undressing steps patient completed: Thread/unthread left pants leg;Thread/unthread right pants leg;Pull pants up/down (declined underwear or shoe) Lower body dressing/undressing: 4: Min-Patient completed 75 plus % of tasks  FIM - Toileting Toileting steps completed by patient: Adjust clothing prior to toileting;Adjust clothing after toileting;Performs perineal hygiene Toileting Assistive Devices: Grab bar or rail for support Toileting: 4: Steadying assist  FIM - Diplomatic Services operational officer Devices: Walker (pt's br at home may not be w/c accessible ) Toilet Transfers: 4-To toilet/BSC: Min A (steadying Pt. > 75%);4-From toilet/BSC: Min A (steadying Pt. > 75%) (5 steps in and 5 steps out with walker)  FIM - Banker Devices: Walker;Arm rests Bed/Chair Transfer: 5: Bed > Chair or W/C: Supervision (verbal cues/safety issues);5: Chair or W/C > Bed: Supervision (verbal cues/safety issues)  FIM - Locomotion: Wheelchair Distance: 200 Locomotion: Wheelchair: 5: Travels 150 ft or more: maneuvers on rugs and over door sills with supervision, cueing or coaxing FIM - Locomotion: Ambulation Locomotion: Ambulation Assistive Devices: Designer, industrial/product Ambulation/Gait Assistance: 4: Min assist Locomotion: Ambulation: 0: Activity did not occur  Comprehension Comprehension Mode: Auditory Comprehension: 7-Follows complex conversation/direction: With no assist  Expression Expression Mode: Verbal Expression: 7-Expresses complex ideas: With no assist  Social  Interaction Social Interaction: 7-Interacts appropriately with others - No medications needed.  Problem Solving Problem Solving: 7-Solves complex problems: Recognizes & self-corrects  Memory Memory: 7-Complete Independence: No helper  Medical Problem List and Plan:  1. 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 DVT Prophylaxis/Anticoagulation: Subcutaneous Lovenox. Monitor platelet counts and any signs of bleeding  3. Pain Management: Demerol 50 mg every 4 hours as needed. Monitor mental status closely and any signs of increased nausea  4. Mood: Xanax 0.25 mg 3 times daily as needed. Provide emotional support and positive reinforcement  5. Neuropsych: This patient is capable of making decisions on his/her own behalf.  6.Postoperative anemia .Niferex twice a day. Followup CBC  7. Coronary artery disease with PTCA . continue aspirin Plavix therapy. Patient denies any chest pain or shortness of breath  8. Diabetes mellitus with peripheral neuropathy. Latest hemoglobin A1c of 6.3 .D/C'ed Glyburide 2.5 mg daily due to hypoglycemia , trandjenta 5 mg each bedtime .Metformin/actos to qd Check blood sugars a.c. and at bedtime-no new changes until consistent pattern develops to avoid hypoglycemia 9. Diastolic congestive heart failure. Lasix 40 mg daily. Monitor for any signs of fluid overload, keep left leg elevated when possible.  i's o's are up overall  since admit  -weight up also  -increase lasix to bid  -daily weights 10. Hypertension . imdur 60 mg daily, Altace 2.5 mg twice a day, Lopressor 50 mg twice a day  11. Constipation. Adjusted bowel program as needed add senna 12. Hypokalemia: recheck labs    -kdur supplement    LOS (Days) 8 A FACE TO FACE EVALUATION WAS  PERFORMED  Rolondo Pierre T 05/04/2012, 7:25 AM

## 2012-05-04 NOTE — Progress Notes (Signed)
Occupational Therapy Session Note  Patient Details  Name: [name] MRN: [id number] Date of Birth: 02-Feb-1946  Today's Date: 05/04/2012 Time: 0830-0930 Time Calculation (min): 60 min  Short Term Goals: Week 1:  OT Short Term Goal 1 (Week 1): Pt will transfer to toilet and or BSC with RW with supervision. OT Short Term Goal 2 (Week 1): Pt will dress LB with supervision. OT Short Term Goal 3 (Week 1): Pt will bathe with supervision.  Skilled Therapeutic Interventions/Progress Updates:  Patient resting in w/c upon arrival and again declining shower.  Patient very protective of residual limb due to reports, "they say I might be a candidate for another amputation and since the incision looks good right now, I want to keep it that way so I probably won't even shower at home at first!"  Self care retraining to include sponge bath, dress, and groom at sink.  Focus session on activity tolerance, increased confidence in her abilities related to functional mobility with BADL tasks, sit><stand, and dynamic standing balance.  Patient is now Mod I for squat/stand pivot transfers w/c >< bed or commode with 3 in 1 over it.  She is still supervision when using the walker for transfers.  Patient will begin using walker to gather clothes & supplies prior to self care session.  Therapy Documentation Precautions:  Precautions Precautions: Fall Precaution Comments: Limb guard Rt. LE  Restrictions Weight Bearing Restrictions: Yes Pain: Pain Assessment Pain Assessment: No/denies pain ADL:  See FIM for current functional status  Therapy/Group: Individual Therapy  Careen Mauch 05/04/2012, 11:42 AM

## 2012-05-05 ENCOUNTER — Inpatient Hospital Stay (HOSPITAL_COMMUNITY): Payer: Medicare Other | Admitting: Occupational Therapy

## 2012-05-05 LAB — GLUCOSE, CAPILLARY
Glucose-Capillary: 168 mg/dL — ABNORMAL HIGH (ref 70–99)
Glucose-Capillary: 173 mg/dL — ABNORMAL HIGH (ref 70–99)
Glucose-Capillary: 283 mg/dL — ABNORMAL HIGH (ref 70–99)

## 2012-05-05 NOTE — Progress Notes (Signed)
Patient ID: [name], female   DOB: [birth year], 66 y.o.   MRN: [id number]  Subjective/Complaints: Pain improving as is mobility  Review of Systems  Musculoskeletal: Positive for joint pain.  Psychiatric/Behavioral: The patient is nervous/anxious. The patient does not have insomnia.    Objective: Vital Signs: Blood pressure 144/62, pulse 89, temperature 97.3 F (36.3 C), temperature source Oral, resp. rate 18, weight 65.3 kg (143 lb 15.4 oz), SpO2 95.00%. Dg Chest 2 View  05/03/2012  *RADIOLOGY REPORT*  Clinical Data: CHF  CHEST - 2 VIEW  Comparison: Prior chest x-ray 04/08/2012  Findings: Left upper extremity PICC.  The catheter tip projects over the superior cavoatrial junction.  The lungs are well aerated and clear.  No pulmonary edema, pleural effusion, pneumothorax or focal airspace consolidation.  No suspicious pulmonary nodule. Mild cardiomegaly remains unchanged.  Aortic atherosclerosis noted. Regions of dense sclerosis in the greater trochanter of the left humerus also remain unchanged.  IMPRESSION:  1.  No acute cardiopulmonary disease. Specifically no evidence of pulmonary edema.  2.  Stable cardiomegaly.   Original Report Authenticated By: Malachy Moan, M.D.    Results for orders placed during the hospital encounter of 04/26/12 (from the past 72 hour(s))  GLUCOSE, CAPILLARY     Status: Abnormal   Collection Time   05/02/12 11:59 AM      Component Value Range Comment   Glucose-Capillary 132 (*) 70 - 99 mg/dL    Comment 1 Notify RN     GLUCOSE, CAPILLARY     Status: Abnormal   Collection Time   05/02/12  4:34 PM      Component Value Range Comment   Glucose-Capillary 152 (*) 70 - 99 mg/dL    Comment 1 Notify RN     GLUCOSE, CAPILLARY     Status: Abnormal   Collection Time   05/02/12  8:47 PM      Component Value Range Comment   Glucose-Capillary 176 (*) 70 - 99 mg/dL    Comment 1 Notify RN     CREATININE, SERUM     Status: Normal   Collection Time   05/03/12   5:47 AM      Component Value Range Comment   Creatinine, Ser 0.64  0.50 - 1.10 mg/dL    GFR calc non Af Amer >90  >90 mL/min    GFR calc Af Amer >90  >90 mL/min   GLUCOSE, CAPILLARY     Status: Abnormal   Collection Time   05/03/12  7:11 AM      Component Value Range Comment   Glucose-Capillary 153 (*) 70 - 99 mg/dL    Comment 1 Notify RN     GLUCOSE, CAPILLARY     Status: Abnormal   Collection Time   05/03/12 11:14 AM      Component Value Range Comment   Glucose-Capillary 166 (*) 70 - 99 mg/dL    Comment 1 Notify RN     GLUCOSE, CAPILLARY     Status: Abnormal   Collection Time   05/03/12  4:34 PM      Component Value Range Comment   Glucose-Capillary 228 (*) 70 - 99 mg/dL    Comment 1 Notify RN     CBC WITH DIFFERENTIAL     Status: Abnormal   Collection Time   05/03/12  6:20 PM      Component Value Range Comment   WBC 7.9  4.0 - 10.5 K/uL    RBC 3.05 (*) 3.87 - 5.11 MIL/uL  Hemoglobin 8.2 (*) 12.0 - 15.0 g/dL    HCT 16.1 (*) 09.6 - 46.0 %    MCV 83.0  78.0 - 100.0 fL    MCH 26.9  26.0 - 34.0 pg    MCHC 32.4  30.0 - 36.0 g/dL    RDW 04.5 (*) 40.9 - 15.5 %    Platelets 468 (*) 150 - 400 K/uL    Neutrophils Relative 73  43 - 77 %    Neutro Abs 5.8  1.7 - 7.7 K/uL    Lymphocytes Relative 19  12 - 46 %    Lymphs Abs 1.5  0.7 - 4.0 K/uL    Monocytes Relative 6  3 - 12 %    Monocytes Absolute 0.5  0.1 - 1.0 K/uL    Eosinophils Relative 2  0 - 5 %    Eosinophils Absolute 0.1  0.0 - 0.7 K/uL    Basophils Relative 0  0 - 1 %    Basophils Absolute 0.0  0.0 - 0.1 K/uL   BASIC METABOLIC PANEL     Status: Abnormal   Collection Time   05/03/12  6:20 PM      Component Value Range Comment   Sodium 130 (*) 135 - 145 mEq/L    Potassium 4.0  3.5 - 5.1 mEq/L    Chloride 91 (*) 96 - 112 mEq/L    CO2 28  19 - 32 mEq/L    Glucose, Bld 176 (*) 70 - 99 mg/dL    BUN 10  6 - 23 mg/dL    Creatinine, Ser 8.11  0.50 - 1.10 mg/dL    Calcium 9.0  8.4 - 91.4 mg/dL    GFR calc non Af Amer  88 (*) >90 mL/min    GFR calc Af Amer >90  >90 mL/min   GLUCOSE, CAPILLARY     Status: Normal   Collection Time   05/03/12  9:04 PM      Component Value Range Comment   Glucose-Capillary 83  70 - 99 mg/dL    Comment 1 Notify RN     GLUCOSE, CAPILLARY     Status: Abnormal   Collection Time   05/04/12  7:27 AM      Component Value Range Comment   Glucose-Capillary 162 (*) 70 - 99 mg/dL    Comment 1 Notify RN     GLUCOSE, CAPILLARY     Status: Abnormal   Collection Time   05/04/12 11:15 AM      Component Value Range Comment   Glucose-Capillary 191 (*) 70 - 99 mg/dL    Comment 1 Notify RN     GLUCOSE, CAPILLARY     Status: Abnormal   Collection Time   05/04/12  4:22 PM      Component Value Range Comment   Glucose-Capillary 123 (*) 70 - 99 mg/dL    Comment 1 Notify RN     GLUCOSE, CAPILLARY     Status: Abnormal   Collection Time   05/04/12  8:27 PM      Component Value Range Comment   Glucose-Capillary 216 (*) 70 - 99 mg/dL    Comment 1 Notify RN     GLUCOSE, CAPILLARY     Status: Abnormal   Collection Time   05/05/12  7:29 AM      Component Value Range Comment   Glucose-Capillary 168 (*) 70 - 99 mg/dL    Comment 1 Notify RN       HEENT: normal Cardio:  RRR Resp: CTA B/L GI: BS positive Extremity:  No Edema Skin:   Other R BKA  ACE  Neuro: Alert/Oriented Musc/Skel:  Other Able to flex R hip , abd and add. Left leg NVI. Right BK wound is well approximated and looks good, without drainage. Gen ;NAD  Assessment/Plan: 1. Functional deficits secondary to R BKA due to PVD associated with DM which require 3+ hours per day of interdisciplinary therapy in a comprehensive inpatient rehab setting. Physiatrist is providing close team supervision and 24 hour management of active medical problems listed below. Physiatrist and rehab team continue to assess barriers to discharge/monitor patient progress toward functional and medical goals. FIM: FIM - Bathing Bathing Steps Patient  Completed: Chest;Right Arm;Left Arm;Abdomen;Front perineal area;Right upper leg;Left upper leg;Left lower leg (including foot);Buttocks Bathing: 5: Supervision: Safety issues/verbal cues  FIM - Upper Body Dressing/Undressing Upper body dressing/undressing steps patient completed: Thread/unthread right sleeve of pullover shirt/dresss;Thread/unthread left sleeve of pullover shirt/dress;Put head through opening of pull over shirt/dress;Pull shirt over trunk Upper body dressing/undressing: 5: Set-up assist to: Obtain clothing/put away FIM - Lower Body Dressing/Undressing Lower body dressing/undressing steps patient completed: Thread/unthread left pants leg;Thread/unthread right pants leg;Pull pants up/down;Don/Doff left sock;Don/Doff left shoe;Fasten/unfasten left shoe (declined underwear) Lower body dressing/undressing: 5: Supervision: Safety issues/verbal cues  FIM - Toileting Toileting steps completed by patient: Adjust clothing prior to toileting;Performs perineal hygiene;Adjust clothing after toileting Toileting Assistive Devices: Grab bar or rail for support Toileting: 6: More than reasonable amount of time (sqt pvt - w/c><commode with 3n1 over it)  FIM - Diplomatic Services operational officer Devices: Grab bars;Elevated toilet seat Toilet Transfers: 6-More than reasonable amt of time (st pvt w/c><3n1 over commode)  FIM - Banker Devices: Arm rests Bed/Chair Transfer: 6: More than reasonable amt of time;6: Supine > Sit: No assist;6: Sit > Supine: No assist;6: Bed > Chair or W/C: No assist;6: Chair or W/C > Bed: No assist  FIM - Locomotion: Wheelchair Distance: 200 Locomotion: Wheelchair: 5: Travels 150 ft or more: maneuvers on rugs and over door sills with supervision, cueing or coaxing FIM - Locomotion: Ambulation Locomotion: Ambulation Assistive Devices: Designer, industrial/product Ambulation/Gait Assistance: 4: Min assist Locomotion: Ambulation: 1:  Travels less than 50 ft with minimal assistance (Pt.>75%)  Comprehension Comprehension Mode: Auditory Comprehension: 7-Follows complex conversation/direction: With no assist  Expression Expression Mode: Verbal Expression: 7-Expresses complex ideas: With no assist  Social Interaction Social Interaction: 7-Interacts appropriately with others - No medications needed.  Problem Solving Problem Solving: 7-Solves complex problems: Recognizes & self-corrects  Memory Memory: 7-Complete Independence: No helper  Medical Problem List and Plan:  1. 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 DVT Prophylaxis/Anticoagulation: Subcutaneous Lovenox. Monitor platelet counts and any signs of bleeding  3. Pain Management: Demerol 50 mg every 4 hours as needed. Monitor mental status closely and any signs of increased nausea  4. Mood: Xanax 0.25 mg 3 times daily as needed. Provide emotional support and positive reinforcement  5. Neuropsych: This patient is capable of making decisions on his/her own behalf.  6.Postoperative anemia .Niferex twice a day. Followup CBC  7. Coronary artery disease with PTCA . continue aspirin Plavix therapy. Patient denies any chest pain or shortness of breath  8. Diabetes mellitus with peripheral neuropathy. Latest hemoglobin A1c of 6.3 .D/C'ed  Glyburide 2.5 mg daily due to hypoglycemia , trandjenta 5 mg each bedtime .Metformin/actos to qd Check blood sugars a.c. and at bedtime-no new changes until consistent pattern develops to avoid hypoglycemia 9. Diastolic congestive heart failure. Lasix 40 mg daily. Monitor for any signs of fluid overload, keep left leg elevated when possible.  i's o's are up overall since admit  -weight decreased a little  today  -increased lasix to bid  -daily weights 10. Hypertension . imdur 60 mg daily, Altace 2.5 mg twice a day, Lopressor 50 mg twice a day  11. Constipation. Adjusted bowel program as needed add senna 12. Hypokalemia: recheck labs    -kdur supplement    LOS (Days) 9 A FACE TO FACE EVALUATION WAS PERFORMED  SWARTZ,ZACHARY T 05/05/2012, 8:03 AM

## 2012-05-05 NOTE — Progress Notes (Signed)
Occupational Therapy Session Note  Patient Details  Name: [name] MRN: [id number] Date of Birth: 10-13-1945  Today's Date: 05/05/2012 Time: 1130-1155 Time Calculation (min): 25 min  Short Term Goals: Week 1:  OT Short Term Goal 1 (Week 1): Pt will transfer to toilet and or BSC with RW with supervision. OT Short Term Goal 2 (Week 1): Pt will dress LB with supervision. OT Short Term Goal 3 (Week 1): Pt will bathe with supervision.  Skilled Therapeutic Interventions/Progress Updates:  Patient found seated in w/c upon entering room. Patient requested to use bathroom. Patient performed BSC transfer seated over toilet seat and toileting tasks (clothing management & peri care) at mod I level. Patient then transferred back to w/c. Discussed home set-up and community re-entry with patient regarding toileting tasks and toilet transfers. Patient practiced transferring -> elevated toilet seat without BSC seated over top for community re-entry simulation. Practiced stand pivot transfers onto elevated toilet seat with use of grab bars and squat pivot transfers onto elevated toilet seat without the use of grab bars. Also educated patient on w/c management; donning/doffing w/c leg rests. At end of session left patient seated in w/c beside bed with call bell, phone, and lunch tray.   Precautions:  Precautions Precautions: Fall Precaution Comments: Limb guard Rt. LE  Restrictions Weight Bearing Restrictions: Yes  See FIM for current functional status  Therapy/Group: Individual Therapy  Jarelyn Bambach 05/05/2012, 11:57 AM

## 2012-05-06 ENCOUNTER — Inpatient Hospital Stay (HOSPITAL_COMMUNITY): Payer: Medicare Other | Admitting: Occupational Therapy

## 2012-05-06 LAB — GLUCOSE, CAPILLARY
Glucose-Capillary: 294 mg/dL — ABNORMAL HIGH (ref 70–99)
Glucose-Capillary: 100 mg/dL — ABNORMAL HIGH (ref 70–99)
Glucose-Capillary: 241 mg/dL — ABNORMAL HIGH (ref 70–99)

## 2012-05-06 NOTE — Progress Notes (Signed)
Occupational Therapy Session Note  Patient Details  Name: [name] MRN: [id number] Date of Birth: 10/30/1945  Today's Date: 05/06/2012 Time: 1345-1445 Time Calculation (min): 60 min  Short Term Goals: Week 1:  OT Short Term Goal 1 (Week 1): Pt will transfer to toilet and or BSC with RW with supervision. OT Short Term Goal 2 (Week 1): Pt will dress LB with supervision. OT Short Term Goal 3 (Week 1): Pt will bathe with supervision.  Skilled Therapeutic Interventions/Progress Updates:  Patient up in w/c upon arrival visiting with husband.  Engaged in lengthy discussion, demonstration and return demonstration of shower transfer to include type of DME recommended for the shower.  Patient and husband have decide to have the shower door remodeled to make transfers easier and safer.  Both state that she will probably not shower for at least 1 month from the time patient goes home.  Patient and husband return demonstrated simulated transfer and patient lost her balance which required +2 assist to keep her from falling.  Patient gets VERY nervous just talking about possible falling and became very anxious and asked RN for Xanax.  After patient calmed down, reviewed possible causes for LOB and patient stated that she felt that her husband was actually causing her to loose her balance instead of assisting her.  Explained that this was a good learning experience for them both and then assisted husband to understand and demonstrate hand placement as well as how to assist wife.  He then proceeded to  Independently assist wife to ambulate ~8 feet then turn around and sit in w/c.  Therapy Documentation Precautions:  Precautions Precautions: Fall Precaution Comments: Limb guard Rt. LE  Restrictions Weight Bearing Restrictions: Yes Pain: Denies pain  Therapy/Group: Individual Therapy  Tabb Croghan 05/06/2012, 4:35 PM

## 2012-05-06 NOTE — Progress Notes (Signed)
Patient ID: [name], female   DOB: [birth year], 66 y.o.   MRN: [id number]  Subjective/Complaints: Pain improving as is mobility  Review of Systems  Musculoskeletal: Positive for joint pain.  Psychiatric/Behavioral: The patient is nervous/anxious. The patient does not have insomnia.    Objective: Vital Signs: Blood pressure 134/66, pulse 91, temperature 98.4 F (36.9 C), temperature source Oral, resp. rate 18, weight 65.3 kg (143 lb 15.4 oz), SpO2 93.00%. No results found. Results for orders placed during the hospital encounter of 04/26/12 (from the past 72 hour(s))  GLUCOSE, CAPILLARY     Status: Abnormal   Collection Time   05/03/12 11:14 AM      Component Value Range Comment   Glucose-Capillary 166 (*) 70 - 99 mg/dL    Comment 1 Notify RN     GLUCOSE, CAPILLARY     Status: Abnormal   Collection Time   05/03/12  4:34 PM      Component Value Range Comment   Glucose-Capillary 228 (*) 70 - 99 mg/dL    Comment 1 Notify RN     CBC WITH DIFFERENTIAL     Status: Abnormal   Collection Time   05/03/12  6:20 PM      Component Value Range Comment   WBC 7.9  4.0 - 10.5 K/uL    RBC 3.05 (*) 3.87 - 5.11 MIL/uL    Hemoglobin 8.2 (*) 12.0 - 15.0 g/dL    HCT 35.3 (*) 61.4 - 46.0 %    MCV 83.0  78.0 - 100.0 fL    MCH 26.9  26.0 - 34.0 pg    MCHC 32.4  30.0 - 36.0 g/dL    RDW 43.1 (*) 54.0 - 15.5 %    Platelets 468 (*) 150 - 400 K/uL    Neutrophils Relative 73  43 - 77 %    Neutro Abs 5.8  1.7 - 7.7 K/uL    Lymphocytes Relative 19  12 - 46 %    Lymphs Abs 1.5  0.7 - 4.0 K/uL    Monocytes Relative 6  3 - 12 %    Monocytes Absolute 0.5  0.1 - 1.0 K/uL    Eosinophils Relative 2  0 - 5 %    Eosinophils Absolute 0.1  0.0 - 0.7 K/uL    Basophils Relative 0  0 - 1 %    Basophils Absolute 0.0  0.0 - 0.1 K/uL   BASIC METABOLIC PANEL     Status: Abnormal   Collection Time   05/03/12  6:20 PM      Component Value Range Comment   Sodium 130 (*) 135 - 145 mEq/L    Potassium 4.0  3.5 -  5.1 mEq/L    Chloride 91 (*) 96 - 112 mEq/L    CO2 28  19 - 32 mEq/L    Glucose, Bld 176 (*) 70 - 99 mg/dL    BUN 10  6 - 23 mg/dL    Creatinine, Ser 0.86  0.50 - 1.10 mg/dL    Calcium 9.0  8.4 - 76.1 mg/dL    GFR calc non Af Amer 88 (*) >90 mL/min    GFR calc Af Amer >90  >90 mL/min   GLUCOSE, CAPILLARY     Status: Normal   Collection Time   05/03/12  9:04 PM      Component Value Range Comment   Glucose-Capillary 83  70 - 99 mg/dL    Comment 1 Notify RN     GLUCOSE, CAPILLARY  Status: Abnormal   Collection Time   05/04/12  7:27 AM      Component Value Range Comment   Glucose-Capillary 162 (*) 70 - 99 mg/dL    Comment 1 Notify RN     GLUCOSE, CAPILLARY     Status: Abnormal   Collection Time   05/04/12 11:15 AM      Component Value Range Comment   Glucose-Capillary 191 (*) 70 - 99 mg/dL    Comment 1 Notify RN     GLUCOSE, CAPILLARY     Status: Abnormal   Collection Time   05/04/12  4:22 PM      Component Value Range Comment   Glucose-Capillary 123 (*) 70 - 99 mg/dL    Comment 1 Notify RN     GLUCOSE, CAPILLARY     Status: Abnormal   Collection Time   05/04/12  8:27 PM      Component Value Range Comment   Glucose-Capillary 216 (*) 70 - 99 mg/dL    Comment 1 Notify RN     GLUCOSE, CAPILLARY     Status: Abnormal   Collection Time   05/05/12  7:29 AM      Component Value Range Comment   Glucose-Capillary 168 (*) 70 - 99 mg/dL    Comment 1 Notify RN     GLUCOSE, CAPILLARY     Status: Abnormal   Collection Time   05/05/12 12:00 PM      Component Value Range Comment   Glucose-Capillary 173 (*) 70 - 99 mg/dL   GLUCOSE, CAPILLARY     Status: Abnormal   Collection Time   05/05/12  4:42 PM      Component Value Range Comment   Glucose-Capillary 123 (*) 70 - 99 mg/dL    Comment 1 Notify RN     GLUCOSE, CAPILLARY     Status: Abnormal   Collection Time   05/05/12  8:31 PM      Component Value Range Comment   Glucose-Capillary 283 (*) 70 - 99 mg/dL    Comment 1 Notify RN         HEENT: normal Cardio: RRR Resp: CTA B/L GI: BS positive Extremity:  No Edema Skin:   Other R BKA  ACE  Neuro: Alert/Oriented Musc/Skel:  Other Able to flex R hip , abd and add. Left leg NVI. Right BK wound is well approximated and looks good, without drainage. Gen ;NAD  Assessment/Plan: 1. Functional deficits secondary to R BKA due to PVD associated with DM which require 3+ hours per day of interdisciplinary therapy in a comprehensive inpatient rehab setting. Physiatrist is providing close team supervision and 24 hour management of active medical problems listed below. Physiatrist and rehab team continue to assess barriers to discharge/monitor patient progress toward functional and medical goals. FIM: FIM - Bathing Bathing Steps Patient Completed: Right Arm;Chest;Left Arm;Front perineal area;Abdomen;Right upper leg;Left upper leg;Buttocks;Left lower leg (including foot) Bathing: 5: Supervision: Safety issues/verbal cues  FIM - Upper Body Dressing/Undressing Upper body dressing/undressing steps patient completed: Thread/unthread right sleeve of pullover shirt/dresss;Thread/unthread left sleeve of pullover shirt/dress;Put head through opening of pull over shirt/dress;Pull shirt over trunk Upper body dressing/undressing: 5: Set-up assist to: Obtain clothing/put away FIM - Lower Body Dressing/Undressing Lower body dressing/undressing steps patient completed: Thread/unthread right pants leg;Thread/unthread left pants leg;Pull pants up/down;Don/Doff left sock;Don/Doff left shoe Lower body dressing/undressing: 5: Supervision: Safety issues/verbal cues  FIM - Toileting Toileting steps completed by patient: Adjust clothing prior to toileting;Performs perineal hygiene;Adjust clothing after toileting  Toileting Assistive Devices: Grab bar or rail for support Toileting: 6: Assistive device: No helper  FIM - Diplomatic Services operational officer Devices: Bedside commode;Elevated toilet  seat;Grab bars Toilet Transfers: 6-Assistive device: No helper  FIM - Banker Devices: Arm rests Bed/Chair Transfer: 6: More than reasonable amt of time  FIM - Locomotion: Wheelchair Distance: 200 Locomotion: Wheelchair: 5: Travels 150 ft or more: maneuvers on rugs and over door sills with supervision, cueing or coaxing FIM - Locomotion: Ambulation Locomotion: Ambulation Assistive Devices: Designer, industrial/product Ambulation/Gait Assistance: 6: Modified independent (Device/Increase time) Locomotion: Ambulation: 1: Travels less than 50 ft with minimal assistance (Pt.>75%)  Comprehension Comprehension Mode: Auditory Comprehension: 7-Follows complex conversation/direction: With no assist  Expression Expression Mode: Verbal Expression: 7-Expresses complex ideas: With no assist  Social Interaction Social Interaction: 7-Interacts appropriately with others - No medications needed.  Problem Solving Problem Solving: 7-Solves complex problems: Recognizes & self-corrects  Memory Memory: 7-Complete Independence: No helper  Medical Problem List and Plan:  1. 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 DVT Prophylaxis/Anticoagulation: Subcutaneous Lovenox. Monitor platelet counts and any signs of bleeding  3. Pain Management: Demerol 50 mg every 4 hours as needed. Monitor mental status closely and any signs of increased nausea  4. Mood: Xanax 0.25 mg 3 times daily as needed. Provide emotional support and positive reinforcement  5. Neuropsych: This patient is capable of making decisions on his/her own behalf.  6.Postoperative anemia .Niferex twice a day. Followup CBC tomorrow 7. Coronary artery disease with PTCA . continue aspirin Plavix  therapy. Patient denies any chest pain or shortness of breath  8. Diabetes mellitus with peripheral neuropathy. Latest hemoglobin A1c of 6.3 .D/C'ed Glyburide 2.5 mg daily due to hypoglycemia , trandjenta 5 mg each bedtime .Metformin/actos to qd Check blood sugars a.c. and at bedtime-no new changes until consistent pattern develops to avoid hypoglycemia 9. Diastolic congestive heart failure. Lasix 40 mg daily. Monitor for any signs of fluid overload, keep left leg elevated when possible.  i's o's are up overall since admit  -weight still around 65kg  -increased lasix to bid  -daily weights  -check bmet tomorrow 10. Hypertension . imdur 60 mg daily, Altace 2.5 mg twice a day, Lopressor 50 mg twice a day  11. Constipation. Adjusted bowel program as needed add senna 12. Hypokalemia: recheck labs    -kdur supplement    LOS (Days) 10 A FACE TO FACE EVALUATION WAS PERFORMED  Errol Ala T 05/06/2012, 7:51 AM

## 2012-05-07 ENCOUNTER — Inpatient Hospital Stay (HOSPITAL_COMMUNITY): Payer: Medicare Other | Admitting: Occupational Therapy

## 2012-05-07 ENCOUNTER — Inpatient Hospital Stay (HOSPITAL_COMMUNITY): Payer: Medicare Other | Admitting: Physical Therapy

## 2012-05-07 DIAGNOSIS — I739 Peripheral vascular disease, unspecified: Secondary | ICD-10-CM

## 2012-05-07 DIAGNOSIS — S78119A Complete traumatic amputation at level between unspecified hip and knee, initial encounter: Secondary | ICD-10-CM

## 2012-05-07 DIAGNOSIS — L98499 Non-pressure chronic ulcer of skin of other sites with unspecified severity: Secondary | ICD-10-CM

## 2012-05-07 LAB — CBC
Hemoglobin: 8.4 g/dL — ABNORMAL LOW (ref 12.0–15.0)
MCH: 26.8 pg (ref 26.0–34.0)
Platelets: 433 10*3/uL — ABNORMAL HIGH (ref 150–400)
RBC: 3.14 MIL/uL — ABNORMAL LOW (ref 3.87–5.11)
WBC: 5.6 10*3/uL (ref 4.0–10.5)

## 2012-05-07 LAB — BASIC METABOLIC PANEL
CO2: 29 mEq/L (ref 19–32)
Chloride: 94 mEq/L — ABNORMAL LOW (ref 96–112)
Glucose, Bld: 153 mg/dL — ABNORMAL HIGH (ref 70–99)
Potassium: 4.2 mEq/L (ref 3.5–5.1)
Sodium: 131 mEq/L — ABNORMAL LOW (ref 135–145)

## 2012-05-07 LAB — GLUCOSE, CAPILLARY
Glucose-Capillary: 176 mg/dL — ABNORMAL HIGH (ref 70–99)
Glucose-Capillary: 253 mg/dL — ABNORMAL HIGH (ref 70–99)

## 2012-05-07 MED ORDER — FUROSEMIDE 40 MG PO TABS
40.0000 mg | ORAL_TABLET | Freq: Every day | ORAL | Status: DC
  Administered 2012-05-08: 40 mg via ORAL
  Filled 2012-05-07 (×2): qty 1

## 2012-05-07 NOTE — Progress Notes (Signed)
Inpatient Diabetes Program Recommendations  AACE/ADA: New Consensus Statement on Inpatient Glycemic Control (2013)  Target Ranges:  Prepandial:   less than 140 mg/dL      Peak postprandial:   less than 180 mg/dL (1-2 hours)      Critically ill patients:  140 - 180 mg/dL   Reason for Visit: Mid-day hyperglycemia  Inpatient Diabetes Program Recommendations Insulin - Meal Coverage: Pt requiring correction scale with each meal in order not to have hyperglycemia before the next meal.  Lunchtime glucose is continually high in 200's. Please consider additionof meal coverage 3 units tidwc and decrease correction to sensitive scale. tidwc.  Note: Thank you, Lenor Coffin, RN, CNS, Diabetes Coordinator [phone])

## 2012-05-07 NOTE — Plan of Care (Signed)
Problem: RH Stairs Goal: LTG Patient will ambulate up and down stairs w/assist (PT) LTG: Patient will ambulate up and down # of stairs with assistance (PT)  Outcome: Adequate for Discharge Pt is max assist when simulated home situation.  Husband reports today that it will be 2 weeks before they get both handrails up in the garage.  The front porch has steps with handrails, but she cannot reach both, so we practiced twice today with husband doing 1/2 fireman carry and pt holding one rail hopping up and down (2) 6" steps with his assistance.  They demonstrated proficiency enough for d/c home tomorrow.  HHPT will pick up practicing the steps.

## 2012-05-07 NOTE — Discharge Summary (Signed)
[name], [name]             ACCOUNT NO.:  [id number]  MEDICAL RECORD NO.:  [id number]  LOCATION:  4010                         FACILITY:  [facility]  PHYSICIAN:  Ranelle Oyster, M.D.DATE OF BIRTH:  07/15/1945  DATE OF ADMISSION:  04/26/2012 DATE OF DISCHARGE:  05/08/2012                              DISCHARGE SUMMARY   DISCHARGE DIAGNOSES:  Right below-knee amputation secondary to peripheral vascular disease, subcutaneous Lovenox for deep venous thrombosis prophylaxis, pain management, anxiety, postoperative anemia, coronary artery disease, percutaneous transluminal coronary angioplasty, diabetes mellitus with peripheral neuropathy, diastolic congestive heart failure, hypertension, constipation.  This is a 67 year old right-handed female with diabetes mellitus, peripheral neuropathy, peripheral vascular disease with multiple revascularization procedures.  The patient recently discharged April 09, 2012 after aortobifemoral bypass as well as thrombectomy of right femoral to below knee Gore-Tex graft extension and umbilical hernia repair March 26, 2012 per Dr. Arbie Cookey with hospital course complicated by respiratory failure requiring intubation with flash pulmonary edema, elevated troponin with NSTEMI with cardiac catheterization completed showing patency of left circumflex stent.  The patient admitted April 23, 2012 with progressive ischemic changes of right foot as well as increased rest pain.  Limb was not felt to be salvageable and underwent right below-knee amputation April 23, 2012, per Dr. Arbie Cookey.  A limb guard was provided by [employer].  Postoperative pain management.  Placed on subcutaneous Lovenox for DVT prophylaxis. Postoperative anemia.  Hemoglobin 8.0 to 9.0 and monitored.  The patient was admitted for comprehensive rehab program.  PAST MEDICAL HISTORY:  See discharge diagnoses.  SOCIAL HISTORY:  Lives with husband, assistance as needed.   Functional history prior to admission was independent with assistive device. Functional status upon admission to rehab services was minimum to moderate assist for functional mobility.  PHYSICAL EXAMINATION:  VITAL SIGNS:  Blood pressure 109/68, pulse 74, respirations 17 temperature 97.7. GENERAL:  This was an alert female, in no acute distress, followed simple commands. LUNGS:  Decreased breath sounds.  Clear to auscultation. CARDIAC:  Regular rate and rhythm. ABDOMEN:  Soft, nontender.  Good bowel sounds.  Amputation site was dressed.  REHABILITATION HOSPITAL COURSE:  The patient was admitted to inpatient rehab services with therapies initiated on a 3-hour daily basis consisting of physical therapy, occupational therapy, and rehabilitation nursing.  The following issues were addressed during the patient's rehabilitation stay.  Pertaining to [name]'s right below-knee amputation secondary to peripheral vascular disease, surgical site healing nicely with followup per Dr. Arbie Cookey of Vascular Surgery. Continue local care and protection with a limb guard.  She remained on subcutaneous Lovenox for DVT prophylaxis throughout her rehab course. Pain management with the use of Demerol and close monitoring of mental status.  She was using Xanax on limited basis for cases of anxiety with emotional support provided.  She did have history of coronary artery disease, PTCA.  She remained on aspirin, Plavix therapy.  Diabetes mellitus with peripheral neuropathy.  Latest hemoglobin A1c of 6.3.  Her glyburide had been discontinued secondary to some hypoglycemia.  She remained on Tradjenta 5 mg at bedtime as well as metformin with Actos daily.  Noted history of diastolic congestive heart failure.  Her Lasix had been adjusted  twice daily.  She exhibited no signs of fluid overload.  Blood pressure is well controlled with no orthostatic changes.  She did have some mild constipation resolved with  laxative assistance.  Postoperative anemia, hemoglobin remained stable from 8.0 to 8.4 as she continued with iron supplement.  The patient received weekly collaborative interdisciplinary team conferences to discuss estimated length of stay, family teaching, and any barriers to discharge.  She was continent of bowel and bladder.  Overall minimal assist supervision with bathing and dressing, showering.  Supervision for squat pivot transfers, stand pivot with a rolling walker into and out of wheelchair, minimal assist from low furniture service once fatigued.  Wheelchair mobility limited endurance, supervision to ambulate 20 feet with a rolling walker.  Full family teaching was completed and plan is to be discharged to home with her husband with ongoing therapies dictated as per rehab services.  DISCHARGE MEDICATIONS:  At the time of dictation included: 1. Xanax 0.25 mg 3 times daily as needed for anxiety. 2. Demerol 50 mg every 4 hours as needed for pain. 3. Aspirin 325 mg daily. 4. Plavix 75 mg daily. 5. Dexilant 60 mg twice daily. 6. Lasix 40 mg b.i.d. 7. Niferex 150 mg twice daily. 8. Imdur 60 mg daily. 9. Tradjenta 5 mg at bedtime. 10.Lopressor 50 mg b.i.d. 11.Actoplus Met 15/500, 1 tablet daily. 12.Potassium chloride 20 mEq t.i.d. 13.Altace 1.25 mg b.i.d. 14.Senokot tablets 1 p.o. b.i.d., hold for loose stools. 15.Vitamin D 50,000 units every 7 days.  DIET:  Diabetic diet.  SPECIAL INSTRUCTIONS:  Follow up with Dr. Arbie Cookey 2 weeks for removal of staples.  Arrangements to be made to [facility] up with [facility]  Follow up with Dr. Adrian Prince, medical management, appointment to be made.  Home health therapies would be ongoing as dictated per rehab services.     Mariam Dollar, P.A.   ______________________________ Ranelle Oyster, M.D.    DA/MEDQ  D:  05/07/2012  T:  05/07/2012  Job:  [id number]  cc:   Larina Earthly,  M.D. Ranelle Oyster, M.D. Tera Mater. Evlyn Kanner, M.D.

## 2012-05-07 NOTE — Progress Notes (Signed)
Physical Therapy Discharge Summary/Treatment Note  Patient Details  Name: [name] MRN: [id number] Date of Birth: 1945-08-03  Today's Date: 05/07/2012 Time:Session #1: 11:03-12:03Session #2: 4098-1191 Time Calculation (min): Session #1: 60 mins, Session #2: 60 min  Patient has met 10 of 11 long term goals due to improved activity tolerance, improved balance, increased strength and decreased pain.  Patient to discharge at a wheelchair level Modified Independent.   Patient's care partner is independent to provide the necessary physical assistance at discharge.  Reasons goals not met: Did not meet stair goal due to stair situation changed at the last minute. The handrails in the garage cannot be installed for another 2 weeks, so the pt will have to go up the front steps with one handrail and husband's max assist.     Recommendation:  Patient will benefit from ongoing skilled PT services in home health setting to continue to advance safe functional mobility, address ongoing impairments in endurance, arm strength, bil leg strength, balance, gait with assistive device, and minimize fall risk.  Equipment: 18 x 18 WC with basic cushion, R amputee pad, left elevating leg rest, giat belt and bedrails (husband to get these on his own), shower seat (see OT notes).    Reasons for discharge: treatment goals met and discharge from hospital  Patient/family agrees with progress made and goals achieved: Yes  PT Discharge Precautions/Restrictions Precautions Precautions: Fall Precaution Comments: limb guard RLE Restrictions Weight Bearing Restrictions: Yes RLE Weight Bearing: Non weight bearing Vital Signs Therapy Vitals Temp: 97.6 F (36.4 C) Temp src: Oral Pulse Rate: 86  Resp: 18  BP: 107/67 mmHg Patient Position, if appropriate: Sitting Oxygen Therapy SpO2: 98 % O2 Device: None (Room air) Pain Pain Assessment Pain Assessment: No/denies pain Pain Score: 0-No pain     Mobility Bed Mobility Rolling Left: 7: Independent Supine to Sit: 7: Independent Sitting - Scoot to Edge of Bed: 6: Modified independent (Device/Increase time) Sit to Supine: 7: Independent Transfers Sit to Stand: 6: Modified independent (Device/Increase time) Stand to Sit: 6: Modified independent (Device/Increase time) Stand Pivot Transfers: 6: Modified independent (Device/Increase time) Lateral/Scoot Transfers: 6: Modified independent (Device/Increase time);With armrests removed Locomotion  Ambulation Ambulation: Yes Ambulation/Gait Assistance: 5: Supervision Ambulation Distance (Feet): 30 Feet Assistive device: Rolling walker Stairs / Additional Locomotion Stairs: Yes Stairs Assistance: 2: Max assist Stairs Assistance Details (indicate cue type and reason): max assist provided by her husband to hop up 2 6" steps using one rail.  Pt will not have both handrails put up in the garage by the time she gets home.  She will use the front entrance with husband doing fireman carry on one side and she is holding the rail and hopping with the other side.  Stair Management Technique: One rail Right;One rail Left;Forwards Number of Stairs: 2  (x2 repititions) Height of Stairs: 6  Wheelchair Mobility Wheelchair Mobility: Yes Wheelchair Assistance: 6: Modified independent (Device/Increase time) Occupational hygienist: Both upper extremities Wheelchair Parts Management: Needs assistance (to put on bil leg rests, husband proficient) Distance: 150  Trunk/Postural Assessment     Balance Static Sitting Balance Static Sitting - Balance Support: No upper extremity supported;Feet supported Static Sitting - Level of Assistance: 7: Independent Static Standing Balance Static Standing - Balance Support: Right upper extremity supported;Left upper extremity supported Static Standing - Level of Assistance: 6: Modified independent (Device/Increase time) Static Standing - Comment/# of Minutes: uses one  hand on support surface either sink, grab bar or RW to steady herself for balance  Dynamic Standing Balance Dynamic Standing - Level of Assistance: 6: Modified independent (Device/Increase time) (uses one hand on support surface either sink, grab bar or RW) Extremity Assessment      RLE Assessment RLE Assessment: Exceptions to [facility] RLE AROM (degrees) RLE Overall AROM Comments: decreased hip extension by ~10 degrees due to tight hip flexors that pulls all the way up into her abdominal incision.   RLE Strength RLE Overall Strength Comments: 4/5 hip flexion 3+/5 hip abduction, unable to do hip extension prone due to unable to get prone, in sidelying 3+/5 hip ext, knee flexion 3+, knee ext 4/5 LLE AROM (degrees) LLE Overall AROM Comments: WFL LLE Strength LLE Overall Strength Comments: 4/5 knee ext/flex, 4/5 hip flexion, abduct (still continues to struggle with functional edurance )  Skilled Intervention:  Session #1: This session focused on practicing bed mobility with bed flat no rails up to modified independent needing extra time at times to complete tasks.  Transfers modified independent scoot pivot, squat pivot and stand pivot with RW.  Husband facilitated stair practice (see details above), WC mobility indoor flat surfaces modified independent 150'.  Gait with RW 25' supervision, indoor flat surface.  Car transfer with supervision squat pivot.       Session #2: This session focused on education and demonstration of floor transfers mod assist more to lift from the floor than to lower.  We used a 6" step stool for pt to use arms to boost up on before boosting all the way up the the mat table.  Discussed what step stool and other devices that patient and her husband could use at home to simulate this if she ever fell.  Mod assist required and transfer performed x 2.  WC mobility outdoors x 150' modified independent pt able to manage doors, dorsill and 3% incline and declines outside.  Indoor home  simulation over carpet 50' mod I, although slower.  Gait in home environment 10' supervision with RW. Standing HEP added to bed HEP handout (see copy in hard chart).     See FIM for current functional status  [name] Joiner B. Prospero Mahnke, PT, DPT [phone]   05/07/2012, 3:37 PM

## 2012-05-07 NOTE — Progress Notes (Addendum)
Occupational Therapy Discharge Summary and Treatment Notes  Patient Details  Name: [name] MRN: [id number] Date of Birth: Apr 13, 1946  Today's Date: 05/07/2012 Time: [phone] and 100-130 Time Calculation (min): 60 min and 30 min  AM session:  Patient resting in bed upon arrival.  Engaged in self care retraining to include wash hair, sponge bathe, dress, groom and toilet transfers.  Focus of session was for patient to demonstrate Supervision to Mod I level for all above tasks in preparation for discharge tomorrow.  Patient reports pleased with her progress and feels much more confident with her abilities because of her CIR stay!  PM session:  Patient in w/c upon arrival with husband present to review, discuss and practice simple HM tasks in kitchen from a w/c level and finalize that the patient and husband want [place] to order a shower chair.  Patient has met 5 of 5 long term goals due to improved activity tolerance, improved balance and ability to compensate for deficits.  Patient to discharge at overall Modified Independent-supervision level.  Patient's care partner is independent to provide the necessary physical assistance at discharge.    Reasons goals not met: n/a secondary to all goals met. Patient declined to shower while in hospital therefore, shower transfer goal discharged.   Patient and husband have practiced simulated shower transfers.  Recommendation:  Patient will benefit from ongoing skilled OT services in home health setting then eventually outpatient for prosthetic  to continue to advance functional skills in the area of BADL, iADL and Reduce care partner burden.  Equipment: Shower chair  Reasons for discharge: treatment goals met and discharge from hospital  Patient/family agrees with progress made and goals achieved: Yes  OT Discharge Precautions/Restrictions  Precautions Precautions: Fall Precaution Comments: limb guard RLE Restrictions Weight Bearing  Restrictions: Yes (RLE) Pain Denies pain Vision/Perception  Vision - History Baseline Vision: Wears glasses all the time Patient Visual Report: No change from baseline Vision - Assessment Eye Alignment: Within Functional Limits  Cognition Orientation Level: Oriented X4 (becomes anxious easily then not thinking clearly.) Sensation Sensation Light Touch: Appears Intact (BUEs) Coordination Gross Motor Movements are Fluid and Coordinated: Yes Fine Motor Movements are Fluid and Coordinated: Yes Motor  Motor Motor - Skilled Clinical Observations: generalized muscle weakness Motor - Discharge Observations: generalized weakness yet showing improvement Mobility  Bed Mobility Rolling Left: 7: Independent Supine to Sit: 7: Independent Sitting - Scoot to Edge of Bed: 6: Modified independent (Device/Increase time) Sit to Supine: 7: Independent Transfers Sit to Stand: 6: Modified independent (Device/Increase time) Stand to Sit: 6: Modified independent (Device/Increase time)  Balance Static Sitting Balance Static Sitting - Balance Support: No upper extremity supported;Feet supported Static Sitting - Level of Assistance: 7: Independent Static Standing Balance Static Standing - Balance Support: Right upper extremity supported;Left upper extremity supported Static Standing - Level of Assistance: 6: Modified independent (Device/Increase time) Static Standing - Comment/# of Minutes: uses one hand on support surface either sink, grab bar or RW to steady herself for balance Dynamic Standing Balance Dynamic Standing - Level of Assistance: 6: Modified independent (Device/Increase time) (uses one hand on support surface either sink, grab bar or RW) Extremity/Trunk Assessment RUE Assessment RUE Assessment: Within Functional Limits LUE Assessment LUE Assessment: Within Functional Limits  See FIM for current functional status  Elea Holtzclaw 05/07/2012, 3:43 PM

## 2012-05-07 NOTE — Progress Notes (Signed)
Patient ID: [name], female   DOB: May 20, 1946, 66 y.o.   MRN: [id number]  Subjective/Complaints: Pain improving as is mobility. Excited to go home  Review of Systems  Musculoskeletal: Positive for joint pain.  Psychiatric/Behavioral: The patient is nervous/anxious. The patient does not have insomnia.    Objective: Vital Signs: Blood pressure 109/68, pulse 74, temperature 98.2 F (36.8 C), temperature source Oral, resp. rate 18, weight 65.3 kg (143 lb 15.4 oz), SpO2 97.00%. No results found. Results for orders placed during the hospital encounter of 04/26/12 (from the past 72 hour(s))  GLUCOSE, CAPILLARY     Status: Abnormal   Collection Time   05/04/12  7:27 AM      Component Value Range Comment   Glucose-Capillary 162 (*) 70 - 99 mg/dL    Comment 1 Notify RN     GLUCOSE, CAPILLARY     Status: Abnormal   Collection Time   05/04/12 11:15 AM      Component Value Range Comment   Glucose-Capillary 191 (*) 70 - 99 mg/dL    Comment 1 Notify RN     GLUCOSE, CAPILLARY     Status: Abnormal   Collection Time   05/04/12  4:22 PM      Component Value Range Comment   Glucose-Capillary 123 (*) 70 - 99 mg/dL    Comment 1 Notify RN     GLUCOSE, CAPILLARY     Status: Abnormal   Collection Time   05/04/12  8:27 PM      Component Value Range Comment   Glucose-Capillary 216 (*) 70 - 99 mg/dL    Comment 1 Notify RN     GLUCOSE, CAPILLARY     Status: Abnormal   Collection Time   05/05/12  7:29 AM      Component Value Range Comment   Glucose-Capillary 168 (*) 70 - 99 mg/dL    Comment 1 Notify RN     GLUCOSE, CAPILLARY     Status: Abnormal   Collection Time   05/05/12 12:00 PM      Component Value Range Comment   Glucose-Capillary 173 (*) 70 - 99 mg/dL   GLUCOSE, CAPILLARY     Status: Abnormal   Collection Time   05/05/12  4:42 PM      Component Value Range Comment   Glucose-Capillary 123 (*) 70 - 99 mg/dL    Comment 1 Notify RN     GLUCOSE, CAPILLARY     Status: Abnormal   Collection  Time   05/05/12  8:31 PM      Component Value Range Comment   Glucose-Capillary 283 (*) 70 - 99 mg/dL    Comment 1 Notify RN     GLUCOSE, CAPILLARY     Status: Abnormal   Collection Time   05/06/12  7:48 AM      Component Value Range Comment   Glucose-Capillary 128 (*) 70 - 99 mg/dL    Comment 1 Notify RN     GLUCOSE, CAPILLARY     Status: Abnormal   Collection Time   05/06/12 11:54 AM      Component Value Range Comment   Glucose-Capillary 294 (*) 70 - 99 mg/dL    Comment 1 Notify RN     GLUCOSE, CAPILLARY     Status: Abnormal   Collection Time   05/06/12  5:02 PM      Component Value Range Comment   Glucose-Capillary 100 (*) 70 - 99 mg/dL    Comment 1 Notify RN  GLUCOSE, CAPILLARY     Status: Abnormal   Collection Time   05/06/12  9:17 PM      Component Value Range Comment   Glucose-Capillary 241 (*) 70 - 99 mg/dL    Comment 1 Notify RN     CBC     Status: Abnormal   Collection Time   05/07/12  5:50 AM      Component Value Range Comment   WBC 5.6  4.0 - 10.5 K/uL    RBC 3.14 (*) 3.87 - 5.11 MIL/uL    Hemoglobin 8.4 (*) 12.0 - 15.0 g/dL    HCT 16.1 (*) 09.6 - 46.0 %    MCV 83.8  78.0 - 100.0 fL    MCH 26.8  26.0 - 34.0 pg    MCHC 31.9  30.0 - 36.0 g/dL    RDW 04.5 (*) 40.9 - 15.5 %    Platelets 433 (*) 150 - 400 K/uL   BASIC METABOLIC PANEL     Status: Abnormal   Collection Time   05/07/12  5:50 AM      Component Value Range Comment   Sodium 131 (*) 135 - 145 mEq/L    Potassium 4.2  3.5 - 5.1 mEq/L    Chloride 94 (*) 96 - 112 mEq/L    CO2 29  19 - 32 mEq/L    Glucose, Bld 153 (*) 70 - 99 mg/dL    BUN 8  6 - 23 mg/dL    Creatinine, Ser 8.11  0.50 - 1.10 mg/dL    Calcium 9.0  8.4 - 91.4 mg/dL    GFR calc non Af Amer 88 (*) >90 mL/min    GFR calc Af Amer >90  >90 mL/min     HEENT: normal Cardio: RRR Resp: CTA B/L GI: BS positive Extremity:  No Edema Skin:   Other R BKA  ACE  Neuro: Alert/Oriented Musc/Skel:  Other Able to flex R hip , abd and add. Left leg  NVI. Right BK wound is well approximated and looks good, without drainage. Gen ;NAD  Assessment/Plan: 1. Functional deficits secondary to R BKA due to PVD associated with DM which require 3+ hours per day of interdisciplinary therapy in a comprehensive inpatient rehab setting. Physiatrist is providing close team supervision and 24 hour management of active medical problems listed below. Physiatrist and rehab team continue to assess barriers to discharge/monitor patient progress toward functional and medical goals. FIM: FIM - Bathing Bathing Steps Patient Completed: Chest;Right Arm;Left Arm;Abdomen;Front perineal area;Right upper leg;Buttocks;Left upper leg;Left lower leg (including foot) Bathing: 5: Supervision: Safety issues/verbal cues  FIM - Upper Body Dressing/Undressing Upper body dressing/undressing steps patient completed: Thread/unthread right sleeve of pullover shirt/dresss;Thread/unthread left sleeve of pullover shirt/dress;Put head through opening of pull over shirt/dress;Pull shirt over trunk Upper body dressing/undressing: 5: Set-up assist to: Obtain clothing/put away FIM - Lower Body Dressing/Undressing Lower body dressing/undressing steps patient completed: Thread/unthread right underwear leg;Thread/unthread left underwear leg;Pull underwear up/down;Thread/unthread right pants leg;Thread/unthread left pants leg;Pull pants up/down Lower body dressing/undressing: 5: Supervision: Safety issues/verbal cues  FIM - Toileting Toileting steps completed by patient: Adjust clothing prior to toileting;Performs perineal hygiene;Adjust clothing after toileting Toileting Assistive Devices: Grab bar or rail for support Toileting: 6: Assistive device: No helper  FIM - Diplomatic Services operational officer Devices: Bedside commode;Elevated toilet seat;Grab bars Toilet Transfers: 6-Assistive device: No helper  FIM - Banker Devices: Arm  rests Bed/Chair Transfer: 6: More than reasonable amt of time  FIM -  Locomotion: Wheelchair Distance: 200 Locomotion: Wheelchair: 5: Travels 150 ft or more: maneuvers on rugs and over door sills with supervision, cueing or coaxing FIM - Locomotion: Ambulation Locomotion: Ambulation Assistive Devices: Designer, industrial/product Ambulation/Gait Assistance: 6: Modified independent (Device/Increase time) Locomotion: Ambulation: 1: Travels less than 50 ft with minimal assistance (Pt.>75%)  Comprehension Comprehension Mode: Auditory Comprehension: 7-Follows complex conversation/direction: With no assist  Expression Expression Mode: Verbal Expression: 7-Expresses complex ideas: With no assist  Social Interaction Social Interaction: 7-Interacts appropriately with others - No medications needed.  Problem Solving Problem Solving: 7-Solves complex problems: Recognizes & self-corrects  Memory Memory: 7-Complete Independence: No helper  Medical Problem List and Plan:  1. 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 DVT Prophylaxis/Anticoagulation: Subcutaneous Lovenox. Monitor platelet counts and any signs of bleeding  3. Pain Management: Demerol 50 mg every 4 hours as needed. Monitor mental status closely and any signs of increased nausea  4. Mood: Xanax 0.25 mg 3 times daily as needed. Provide emotional support and positive reinforcement  5. Neuropsych: This patient is capable of making decisions on his/her own behalf.  6.Postoperative anemia .Niferex twice a day. Followup CBC tomorrow 7. Coronary artery disease with PTCA . continue aspirin Plavix therapy. Patient denies any chest pain or shortness of breath  8. Diabetes mellitus with peripheral neuropathy. Latest hemoglobin A1c  of 6.3 .D/C'ed Glyburide 2.5 mg daily due to hypoglycemia , trandjenta 5 mg each bedtime .Metformin/actos to qd Check blood sugars a.c. and at bedtime-no new changes until consistent pattern develops to avoid hypoglycemia 9. Diastolic congestive heart failure. Lasix 40 mg daily. Monitor for any signs of fluid overload, keep left leg elevated when possible.  i's o's are up overall since admit  -weight still around 65kg  -increased lasix to bid  -daily weights  -check bmet today 10. Hypertension . imdur 60 mg daily, Altace 2.5 mg twice a day, Lopressor 50 mg twice a day  11. Constipation. Adjusted bowel program as needed add senna 12. Hypokalemia: recheck labs    -kdur supplement    LOS (Days) 11 A FACE TO FACE EVALUATION WAS PERFORMED  Aseel Uhde T 05/07/2012, 6:58 AM

## 2012-05-07 NOTE — Discharge Summary (Signed)
  Discharge summary job [id number]

## 2012-05-07 NOTE — Progress Notes (Signed)
Patient ID: [name], female   DOB: 06/05/1946, 66 y.o.   MRN: [id number] Pod 14. BKA looks great. Plan dc in AM  Will see in office 11/19

## 2012-05-07 NOTE — Plan of Care (Signed)
Problem: RH Tub/Shower Transfers Goal: LTG Patient will perform tub/shower transfers w/assist (OT) LTG: Patient will perform tub/shower transfers with assist, with/without cues using equipment (OT)  Outcome: Not Applicable Date Met:  05/07/12 Discharged Goal per patient request secondary to she plans to sponge bathe for at least a month or so after discharge.

## 2012-05-08 DIAGNOSIS — I70269 Atherosclerosis of native arteries of extremities with gangrene, unspecified extremity: Secondary | ICD-10-CM

## 2012-05-08 DIAGNOSIS — S78119A Complete traumatic amputation at level between unspecified hip and knee, initial encounter: Secondary | ICD-10-CM

## 2012-05-08 LAB — BASIC METABOLIC PANEL
CO2: 27 mEq/L (ref 19–32)
Calcium: 9.1 mg/dL (ref 8.4–10.5)
Chloride: 95 mEq/L — ABNORMAL LOW (ref 96–112)
Creatinine, Ser: 0.71 mg/dL (ref 0.50–1.10)
Glucose, Bld: 158 mg/dL — ABNORMAL HIGH (ref 70–99)

## 2012-05-08 MED ORDER — ALPRAZOLAM 0.25 MG PO TABS: 0.2500 mg | ORAL_TABLET | Freq: Three times a day (TID) | ORAL | Status: DC | PRN

## 2012-05-08 MED ORDER — VITAMIN D (ERGOCALCIFEROL) 1.25 MG (50000 UNIT) PO CAPS: 50000.0000 [IU] | ORAL_CAPSULE | ORAL | Status: DC

## 2012-05-08 MED ORDER — RAMIPRIL 1.25 MG PO CAPS: 1.2500 mg | ORAL_CAPSULE | Freq: Two times a day (BID) | ORAL | Status: DC

## 2012-05-08 NOTE — Progress Notes (Signed)
Patient ID: [name], female   DOB: [birth year], 66 y.o.   MRN: [id number]  Subjective/Complaints: Pain improving as is mobility. Excited to go home today  Review of Systems  Musculoskeletal: Positive for joint pain.  Psychiatric/Behavioral: The patient is nervous/anxious. The patient does not have insomnia.    Objective: Vital Signs: Blood pressure 145/75, pulse 94, temperature 98.1 F (36.7 C), temperature source Oral, resp. rate 18, weight 58 kg (127 lb 13.9 oz), SpO2 95.00%. No results found. Results for orders placed during the hospital encounter of 04/26/12 (from the past 72 hour(s))  GLUCOSE, CAPILLARY     Status: Abnormal   Collection Time   05/05/12 12:00 PM      Component Value Range Comment   Glucose-Capillary 173 (*) 70 - 99 mg/dL   GLUCOSE, CAPILLARY     Status: Abnormal   Collection Time   05/05/12  4:42 PM      Component Value Range Comment   Glucose-Capillary 123 (*) 70 - 99 mg/dL    Comment 1 Notify RN     GLUCOSE, CAPILLARY     Status: Abnormal   Collection Time   05/05/12  8:31 PM      Component Value Range Comment   Glucose-Capillary 283 (*) 70 - 99 mg/dL    Comment 1 Notify RN     GLUCOSE, CAPILLARY     Status: Abnormal   Collection Time   05/06/12  7:48 AM      Component Value Range Comment   Glucose-Capillary 128 (*) 70 - 99 mg/dL    Comment 1 Notify RN     GLUCOSE, CAPILLARY     Status: Abnormal   Collection Time   05/06/12 11:54 AM      Component Value Range Comment   Glucose-Capillary 294 (*) 70 - 99 mg/dL    Comment 1 Notify RN     GLUCOSE, CAPILLARY     Status: Abnormal   Collection Time   05/06/12  5:02 PM      Component Value Range Comment   Glucose-Capillary 100 (*) 70 - 99 mg/dL    Comment 1 Notify RN     GLUCOSE, CAPILLARY     Status: Abnormal   Collection Time   05/06/12  9:17 PM      Component Value Range Comment   Glucose-Capillary 241 (*) 70 - 99 mg/dL    Comment 1 Notify RN     CBC     Status: Abnormal   Collection Time   05/07/12  5:50 AM      Component Value Range Comment   WBC 5.6  4.0 - 10.5 K/uL    RBC 3.14 (*) 3.87 - 5.11 MIL/uL    Hemoglobin 8.4 (*) 12.0 - 15.0 g/dL    HCT 40.9 (*) 81.1 - 46.0 %    MCV 83.8  78.0 - 100.0 fL    MCH 26.8  26.0 - 34.0 pg    MCHC 31.9  30.0 - 36.0 g/dL    RDW 91.4 (*) 78.2 - 15.5 %    Platelets 433 (*) 150 - 400 K/uL   BASIC METABOLIC PANEL     Status: Abnormal   Collection Time   05/07/12  5:50 AM      Component Value Range Comment   Sodium 131 (*) 135 - 145 mEq/L    Potassium 4.2  3.5 - 5.1 mEq/L    Chloride 94 (*) 96 - 112 mEq/L    CO2 29  19 - 32  mEq/L    Glucose, Bld 153 (*) 70 - 99 mg/dL    BUN 8  6 - 23 mg/dL    Creatinine, Ser 0.98  0.50 - 1.10 mg/dL    Calcium 9.0  8.4 - 11.9 mg/dL    GFR calc non Af Amer 88 (*) >90 mL/min    GFR calc Af Amer >90  >90 mL/min   GLUCOSE, CAPILLARY     Status: Abnormal   Collection Time   05/07/12  7:15 AM      Component Value Range Comment   Glucose-Capillary 176 (*) 70 - 99 mg/dL    Comment 1 Notify RN     GLUCOSE, CAPILLARY     Status: Abnormal   Collection Time   05/07/12 11:56 AM      Component Value Range Comment   Glucose-Capillary 213 (*) 70 - 99 mg/dL    Comment 1 Notify RN     GLUCOSE, CAPILLARY     Status: Abnormal   Collection Time   05/07/12  4:32 PM      Component Value Range Comment   Glucose-Capillary 119 (*) 70 - 99 mg/dL   GLUCOSE, CAPILLARY     Status: Abnormal   Collection Time   05/07/12  9:09 PM      Component Value Range Comment   Glucose-Capillary 253 (*) 70 - 99 mg/dL   BASIC METABOLIC PANEL     Status: Abnormal   Collection Time   05/08/12  5:40 AM      Component Value Range Comment   Sodium 130 (*) 135 - 145 mEq/L    Potassium 4.7  3.5 - 5.1 mEq/L    Chloride 95 (*) 96 - 112 mEq/L    CO2 27  19 - 32 mEq/L    Glucose, Bld 158 (*) 70 - 99 mg/dL    BUN 9  6 - 23 mg/dL    Creatinine, Ser 1.47  0.50 - 1.10 mg/dL    Calcium 9.1  8.4 - 82.9 mg/dL    GFR calc non Af Amer 87 (*) >90 mL/min     GFR calc Af Amer >90  >90 mL/min   GLUCOSE, CAPILLARY     Status: Abnormal   Collection Time   05/08/12  7:13 AM      Component Value Range Comment   Glucose-Capillary 176 (*) 70 - 99 mg/dL    Comment 1 Notify RN       HEENT: normal Cardio: RRR Resp: CTA B/L GI: BS positive Extremity:  No Edema Skin:   Other R BKA  ACE  Neuro: Alert/Oriented Musc/Skel:  Other Able to flex R hip , abd and add. Left leg NVI. Right BK wound is well approximated and looks good, without drainage. Gen ;NAD  Assessment/Plan: 1. Functional deficits secondary to R BKA due to PVD associated with DM which require 3+ hours per day of interdisciplinary therapy in a comprehensive inpatient rehab setting. Physiatrist is providing close team supervision and 24 hour management of active medical problems listed below. Physiatrist and rehab team continue to assess barriers to discharge/monitor patient progress toward functional and medical goals. FIM: FIM - Bathing Bathing Steps Patient Completed: Chest;Right Arm;Left Arm;Abdomen;Front perineal area;Right upper leg;Left upper leg;Left lower leg (including foot);Buttocks Bathing: 5: Supervision: Safety issues/verbal cues  FIM - Upper Body Dressing/Undressing Upper body dressing/undressing steps patient completed: Thread/unthread right sleeve of pullover shirt/dresss;Thread/unthread left sleeve of pullover shirt/dress;Put head through opening of pull over shirt/dress;Pull shirt over trunk Upper body dressing/undressing:  6: More than reasonable amount of time FIM - Lower Body Dressing/Undressing Lower body dressing/undressing steps patient completed: Thread/unthread left pants leg;Thread/unthread right pants leg;Pull pants up/down;Don/Doff left sock;Don/Doff left shoe;Fasten/unfasten left shoe;Fasten/unfasten pants (declined underwear) Lower body dressing/undressing: 5: Supervision: Safety issues/verbal cues  FIM - Toileting Toileting steps completed by patient:  Adjust clothing prior to toileting;Performs perineal hygiene;Adjust clothing after toileting Toileting Assistive Devices: Grab bar or rail for support Toileting: 6: More than reasonable amount of time (sqt pvt - w/c><commode with 3n1 over it)  FIM - Diplomatic Services operational officer Devices: Grab bars;Elevated toilet seat Toilet Transfers: 6-More than reasonable amt of time (st pvt w/c><3n1 over commode)  FIM - Banker Devices: Arm rests Bed/Chair Transfer: 6: More than reasonable amt of time  FIM - Locomotion: Wheelchair Distance: 150 Locomotion: Wheelchair: 5: Travels 150 ft or more: maneuvers on rugs and over door sills with supervision, cueing or coaxing FIM - Locomotion: Ambulation Locomotion: Ambulation Assistive Devices: Designer, industrial/product Ambulation/Gait Assistance: 5: Supervision Locomotion: Ambulation: 1: Travels less than 50 ft with minimal assistance (Pt.>75%)  Comprehension Comprehension Mode: Auditory Comprehension: 7-Follows complex conversation/direction: With no assist  Expression Expression Mode: Verbal Expression: 7-Expresses complex ideas: With no assist  Social Interaction Social Interaction: 7-Interacts appropriately with others - No medications needed.  Problem Solving Problem Solving: 7-Solves complex problems: Recognizes & self-corrects  Memory Memory: 6-More than reasonable amt of time (anxiety can be limiting)  Medical Problem List and Plan:  1. 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 DVT Prophylaxis/Anticoagulation: Subcutaneous Lovenox. Monitor platelet counts and any signs of bleeding  3. Pain Management: Demerol 50 mg every 4 hours as needed. Monitor mental status closely  and any signs of increased nausea  4. Mood: Xanax 0.25 mg 3 times daily as needed. Provide emotional support and positive reinforcement  5. Neuropsych: This patient is capable of making decisions on his/her own behalf.  6.Postoperative anemia .Niferex twice a day. Followup CBC tomorrow 7. Coronary artery disease with PTCA . continue aspirin Plavix therapy. Patient denies any chest pain or shortness of breath  8. Diabetes mellitus with peripheral neuropathy. Latest hemoglobin A1c of 6.3 .D/C'ed Glyburide 2.5 mg daily due to hypoglycemia , trandjenta 5 mg each bedtime .Metformin/actos to qd Check blood sugars a.c. and at bedtime-no new changes until consistent pattern develops to avoid hypoglycemia 9. Diastolic congestive heart failure. Lasix 40 mg daily. Monitor for any signs of fluid overload, keep left leg elevated when possible.  i's o's are up overall since admit  -weight still around 65kg  -decreased back to qd--hold for 2 days  -daily weights    10. Hypertension . imdur 60 mg daily, Altace 2.5 mg twice a day, Lopressor 50 mg twice a day  11. Constipation. Adjusted bowel program as needed add senna 12. Hypokalemia: recheck labs    -kdur supplement  -mild hyponatremia d/t lasix which has been stopped  -recheck as an outpt on Friday    LOS (Days) 12 A FACE TO FACE EVALUATION WAS PERFORMED  SWARTZ,ZACHARY T 05/08/2012, 7:58 AM

## 2012-05-08 NOTE — Progress Notes (Signed)
Social Work Discharge Note  The overall goal for the admission was met for:   Discharge location: Yes - home with husband to provide 24/7 assistance  Length of Stay: Yes - 12 days  Discharge activity level: Yes - supervision to minimal assistance  Home/community participation: Yes  Services provided included: MD, RD, PT, OT, RN, TR, Pharmacy and SW  Financial Services: Medicare and Private Insurance: BCBS  Follow-up services arranged: Home Health: RN, PT, OT via [facility], DME: 18x18 [employer] w/c with right amputee support pad and left ELR, basic cushion, tub seat via [facility], Other: info provided on local Amputee Support Group and Patient/Family has no preference for HH/DME agencies  Comments (or additional information):  Patient/Family verbalized understanding of follow-up arrangements: Yes  Individual responsible for coordination of the follow-up plan: patient  Confirmed correct DME delivered: Ry Moody 05/08/2012    Stacey Maura

## 2012-05-08 NOTE — Progress Notes (Signed)
1125 Pt. Discharged from rehab to home.  Discharge instructions provided by Sherol Dade, PA.  Family at beside; no further questions asked. All personal home medications provided upon discharge from Pharmacy.  Patient signed for meds.  Escorted to vehicle by NT.

## 2012-05-21 ENCOUNTER — Encounter: Payer: Self-pay | Admitting: Vascular Surgery

## 2012-05-22 ENCOUNTER — Ambulatory Visit (INDEPENDENT_AMBULATORY_CARE_PROVIDER_SITE_OTHER): Payer: Medicare Other | Admitting: Vascular Surgery

## 2012-05-22 ENCOUNTER — Encounter: Payer: Self-pay | Admitting: Vascular Surgery

## 2012-05-22 VITALS — BP 142/56 | HR 81 | Temp 98.2°F | Ht 64.0 in | Wt 127.0 lb

## 2012-05-22 DIAGNOSIS — I70219 Atherosclerosis of native arteries of extremities with intermittent claudication, unspecified extremity: Secondary | ICD-10-CM

## 2012-05-22 NOTE — Progress Notes (Signed)
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 She did well in rehabilitation and has continued outpatient physical therapy. She reports minimal discomfort from her amputation. She is status post aortobifemoral bypass grafting proximally one month prior to this. Her abdominal incision is well-healed she has 2+ femoral pulse.  Her below knee amputation is warm and well perfused with a slight eschar in the midportion of the incision. We would defer removing her staples for 2 additional weeks. Actually at this area will have a eschar formation and will eventually separate.  Her husband once no the high healing. I put in my estimation that he is she has 80% chance for healing since we are 4 weeks this level of healing to date. I will see her again in 2 weeks for staple removal

## 2012-06-04 ENCOUNTER — Encounter: Payer: Self-pay | Admitting: Vascular Surgery

## 2012-06-05 ENCOUNTER — Ambulatory Visit (INDEPENDENT_AMBULATORY_CARE_PROVIDER_SITE_OTHER): Payer: Medicare Other | Admitting: Vascular Surgery

## 2012-06-05 ENCOUNTER — Encounter: Payer: Self-pay | Admitting: Vascular Surgery

## 2012-06-05 VITALS — BP 133/65 | HR 110 | Ht 64.0 in | Wt 127.0 lb

## 2012-06-05 DIAGNOSIS — I70219 Atherosclerosis of native arteries of extremities with intermittent claudication, unspecified extremity: Secondary | ICD-10-CM

## 2012-06-05 NOTE — Progress Notes (Signed)
Here today for followup of her right below-knee amputation. She was seen again in 2 weeks ago. Since that time she has had some progressive skin necrosis and eschar formation over the lateral 1/2-2/3 of her amputation. There is a stable eschar on the medial aspect of the lateral aspect does have some fat necrosis beneath this. Her staples are still intact. Her stump is otherwise viable with no evidence of erythema and no evidence of deep infection. She actually has good hair growth on the anterior surface of the flap.  A long discussion with the patient and her husband present. I explained that it is certainly concerning for her to have this eschar formation and separation over the stump. I do not see any evidence of any deep tissue loss or infection. I explained this hopefully will simply be a superficial skin and fat necrosis and will eventually heal with local care. We will leave the staples in today and see her again in 2 weeks for continued followup. Patient's husband is always concerned about my prediction for healing. I said 2 weeks ago but we had an 80% chance. The Hasson and twisted back to 70% chance. I was explained that this was very purely subjective.

## 2012-06-13 ENCOUNTER — Telehealth: Payer: Self-pay | Admitting: *Deleted

## 2012-06-13 NOTE — Telephone Encounter (Signed)
[name], [facility] physical therapist called stating patient was having a lot of drainage from right BKA site and slight odor. No fever present. Scheduled appointment with NP at Dr Bosie Helper office for 06/14/12 at 9:20. Patient was notified and she understood that appt was with NP.

## 2012-06-14 ENCOUNTER — Ambulatory Visit (INDEPENDENT_AMBULATORY_CARE_PROVIDER_SITE_OTHER): Payer: Medicare Other | Admitting: Neurosurgery

## 2012-06-14 ENCOUNTER — Encounter: Payer: Self-pay | Admitting: Neurosurgery

## 2012-06-14 VITALS — BP 132/79 | HR 105 | Temp 97.5°F | Resp 18 | Ht 64.0 in | Wt 127.0 lb

## 2012-06-14 DIAGNOSIS — I70219 Atherosclerosis of native arteries of extremities with intermittent claudication, unspecified extremity: Secondary | ICD-10-CM

## 2012-06-14 DIAGNOSIS — T8189XA Other complications of procedures, not elsewhere classified, initial encounter: Secondary | ICD-10-CM

## 2012-06-14 NOTE — Progress Notes (Signed)
Subjective:     Patient ID: [name], female   DOB: 05-07-1946, 66 y.o.   MRN: [id number]  HPI: 66 year old female patient of Dr. Arbie Cookey status post right BKA. The patient has home health helping her and they call the office asking to be seen for wound check. Dr. Arbie Cookey saw the patient in followup on December 3 and noted some skin necrosis and eschar formation along the suture line. The patient and her husband state that there has been some foul odor as well as drainage. The patient has no pain complaints.   Review of Systems: 12 point review of systems is notable for the difficulty described above otherwise unremarkable     Objective:   Physical Exam: Afebrile, vital signs are stable, pulses 105. There is fat necrosis medially along the suture line, it appears that the lateral and his other the incision are healing well and the staples are still in place. Dr. early examined the patient and spoke with the patient and his wife explaining that the wound appears to be healing and will heal by second intention long this open area that is medial on the incision.     Assessment:     Assessment as above    Plan:     Dr. Arbie Cookey explained to the patient and his wife to continue her current care. We will leave staples in place to avoid irritation. The patient is scheduled return next Tuesday to see Dr. Arbie Cookey and at that time he may do some light debridement. The patient and her husband questions were encouraged and answered they're in agreement with this plan.  Lauree Chandler ANP  Clinic M.D.: [facility]

## 2012-06-15 ENCOUNTER — Telehealth: Payer: Self-pay | Admitting: *Deleted

## 2012-06-15 NOTE — Telephone Encounter (Signed)
Need continued orders for PT 2x a week for 3 more weeks. Notified Suella Broad, PT with [facility] ok to continue PT.

## 2012-06-18 ENCOUNTER — Encounter: Payer: Medicare Other | Admitting: Physical Medicine & Rehabilitation

## 2012-06-18 ENCOUNTER — Encounter: Payer: Self-pay | Admitting: Vascular Surgery

## 2012-06-19 ENCOUNTER — Encounter: Payer: Self-pay | Admitting: Vascular Surgery

## 2012-06-19 ENCOUNTER — Ambulatory Visit (INDEPENDENT_AMBULATORY_CARE_PROVIDER_SITE_OTHER): Payer: Medicare Other | Admitting: Vascular Surgery

## 2012-06-19 VITALS — BP 135/60 | HR 95 | Resp 16 | Ht 64.0 in | Wt 127.0 lb

## 2012-06-19 DIAGNOSIS — I70219 Atherosclerosis of native arteries of extremities with intermittent claudication, unspecified extremity: Secondary | ICD-10-CM

## 2012-06-19 NOTE — Progress Notes (Signed)
Here today for continued followup of her right below-knee amputation. Surgery was on 04/23/2012. She does have eschar formation over approximately the medial 1/3-1/2 of the incision but this does not appear to track into the fascia or muscle. I removed all of her staples today and debrided the eschar over the majority of the incision. She will begin daily normal saline wet-to-dry to the open area and continue her Kerlix and Ace wrap over this. I will see her again in 2 weeks for continued followup

## 2012-07-02 ENCOUNTER — Encounter: Payer: Self-pay | Admitting: Vascular Surgery

## 2012-07-03 ENCOUNTER — Ambulatory Visit: Payer: Medicare Other | Admitting: Vascular Surgery

## 2012-07-03 ENCOUNTER — Ambulatory Visit (INDEPENDENT_AMBULATORY_CARE_PROVIDER_SITE_OTHER): Payer: Medicare Other | Admitting: Vascular Surgery

## 2012-07-03 ENCOUNTER — Encounter: Payer: Self-pay | Admitting: Vascular Surgery

## 2012-07-03 VITALS — BP 128/54 | HR 78 | Resp 16 | Ht 64.0 in | Wt 133.0 lb

## 2012-07-03 DIAGNOSIS — I739 Peripheral vascular disease, unspecified: Secondary | ICD-10-CM

## 2012-07-03 DIAGNOSIS — L98499 Non-pressure chronic ulcer of skin of other sites with unspecified severity: Secondary | ICD-10-CM

## 2012-07-03 NOTE — Progress Notes (Signed)
Patient today for continued followup of her right above-knee dictation on 04/23/2012. She's had further fat necrosis in the tissue underlying the skin on the lateral half of her below knee amputation stump. There is no surrounding erythema and no evidence of involvement of the fashion at this time. I debrided the eschar and fat necrosis. She will continue her local wound care. I feel that she still is at some risk for not healing and currently will continue this. I do not feel there is any indication for VAC dressing at this time since she does continue to have some necrotic debris. I did explain that the she may the advancement VAC would be appropriate.

## 2012-07-10 ENCOUNTER — Ambulatory Visit (INDEPENDENT_AMBULATORY_CARE_PROVIDER_SITE_OTHER): Payer: Medicare Other | Admitting: Neurosurgery

## 2012-07-10 ENCOUNTER — Encounter: Payer: Self-pay | Admitting: Neurosurgery

## 2012-07-10 ENCOUNTER — Telehealth: Payer: Self-pay

## 2012-07-10 VITALS — BP 144/73 | HR 85 | Temp 97.0°F | Resp 16 | Ht 64.0 in | Wt 133.0 lb

## 2012-07-10 DIAGNOSIS — W010XXA Fall on same level from slipping, tripping and stumbling without subsequent striking against object, initial encounter: Secondary | ICD-10-CM

## 2012-07-10 DIAGNOSIS — L98499 Non-pressure chronic ulcer of skin of other sites with unspecified severity: Secondary | ICD-10-CM

## 2012-07-10 DIAGNOSIS — I739 Peripheral vascular disease, unspecified: Secondary | ICD-10-CM

## 2012-07-10 HISTORY — DX: Fall on same level from slipping, tripping and stumbling without subsequent striking against object, initial encounter: W01.0XXA

## 2012-07-10 NOTE — Telephone Encounter (Signed)
I scheduled an appt for this pt at 11:20am today and notified medical records so they can prepare pt's chart/awt

## 2012-07-10 NOTE — Progress Notes (Signed)
Subjective:     Patient ID: [name], female   DOB: [birth year], 67 y.o.   MRN: [id number]  HPI: 67 year old female patient of Dr. Arbie Cookey status post a right below the knee amputation in October 2013. The patient called the office stating that she had "bumped her knee" in the shower and had bleeding was brought in for evaluation. The patient reports no other issues.   Review of Systems: 12 point review of systems is notable for the difficulties described above otherwise unremarkable     Objective:   Physical Exam: Afebrile, vital signs are stable, Dr. Arbie Cookey examined the patient she does have some fat necrosis anterior to the incision and has viable fascia tissue below. He did explain to her that surgical debridement may be necessary in the near future. This may result and the placement of a wound VAC.     Assessment:     Slow healing right below the knee amputation, no acute difficulties.    Plan:     The patient will followup in one week with Dr. Arbie Cookey, her debridement will be pending his decision at that appointment. Her questions were encouraged and answered she will continue her wet-to-dry dressings. The patient was given a prescription for Demerol 50 mg 1 by mouth every 4 hours when necessary 30 with no refill, Compazine 10 mg 1 by mouth every 6-8 hours when necessary 30 with 1 refill.  Lauree Chandler ANP    Clinic M.D.: [facility]

## 2012-07-10 NOTE — Telephone Encounter (Signed)
Rec'd call from pt. Reported she got into shower this AM and her right stump incision started dripping blood.  Stated she was not aware if she bumped it, and was unsure if the incision had opened-up.  Stated her husband quickly laid her down on her bed and put a guaze dressing on it.  Has not had any break-through bleeding on the guaze dsg.  Advised pt. To come to office for incision check at 11:20 AM.  Agrees w/ plan.

## 2012-07-16 ENCOUNTER — Encounter: Payer: Self-pay | Admitting: Vascular Surgery

## 2012-07-17 ENCOUNTER — Encounter: Payer: Self-pay | Admitting: Vascular Surgery

## 2012-07-17 ENCOUNTER — Ambulatory Visit (INDEPENDENT_AMBULATORY_CARE_PROVIDER_SITE_OTHER): Payer: Medicare Other | Admitting: Vascular Surgery

## 2012-07-17 VITALS — BP 126/59 | HR 89 | Ht 64.0 in | Wt 135.6 lb

## 2012-07-17 DIAGNOSIS — L98499 Non-pressure chronic ulcer of skin of other sites with unspecified severity: Secondary | ICD-10-CM

## 2012-07-17 DIAGNOSIS — I739 Peripheral vascular disease, unspecified: Secondary | ICD-10-CM

## 2012-07-17 NOTE — Progress Notes (Signed)
Here today for continued followup of her right below-knee amputation wound. She does have some continued mild discomfort and has complete relief with 1 Demerol in evening before sleep.  Her wound does continue to have a slowly granulating base. There was some undermining of the upper edge of this with fat necrosis. This has continued to resolve as well. I debrided some of the fat necrosis from this again today.  I do feel that she has enough granulation tissue and minimal enough of fat necrosis that she is ready for aVAC dressing. I discussed this at length with the patient and her husband present. We will schedule this through home health nurse. I will see her again in 2 weeks

## 2012-07-24 ENCOUNTER — Other Ambulatory Visit: Payer: Self-pay | Admitting: *Deleted

## 2012-07-24 DIAGNOSIS — G8918 Other acute postprocedural pain: Secondary | ICD-10-CM

## 2012-07-24 MED ORDER — MEPERIDINE HCL 50 MG PO TABS: 50.0000 mg | ORAL_TABLET | Freq: Four times a day (QID) | ORAL | Status: DC | PRN

## 2012-07-24 MED ORDER — PROCHLORPERAZINE MALEATE 10 MG PO TABS: 10.0000 mg | ORAL_TABLET | Freq: Four times a day (QID) | ORAL | Status: DC | PRN

## 2012-07-30 ENCOUNTER — Encounter: Payer: Self-pay | Admitting: Vascular Surgery

## 2012-07-31 ENCOUNTER — Ambulatory Visit (INDEPENDENT_AMBULATORY_CARE_PROVIDER_SITE_OTHER): Payer: Medicare Other | Admitting: Vascular Surgery

## 2012-07-31 ENCOUNTER — Encounter: Payer: Self-pay | Admitting: Vascular Surgery

## 2012-07-31 VITALS — BP 166/58 | HR 81 | Ht 64.0 in | Wt 135.0 lb

## 2012-07-31 DIAGNOSIS — I70219 Atherosclerosis of native arteries of extremities with intermittent claudication, unspecified extremity: Secondary | ICD-10-CM

## 2012-07-31 NOTE — Progress Notes (Signed)
Here today for a followup of her below-knee amputation. I had started her on a VAC dressing 2 weeks ago. She had a great deal of fat necrosis under the skin and had been debriding this away. There was still a slight amount visit the upper portion of the incision. She does have good healing of the wound with a VAC present. Is approximately 3 x 5 cm in maximal diameter. I did debride some of the fat necrosis and agitated today. Discussed options but feel that VAC dressing is her best option currently. She will continue this we will see her in 2 weeks

## 2012-08-13 ENCOUNTER — Encounter: Payer: Self-pay | Admitting: Vascular Surgery

## 2012-08-14 ENCOUNTER — Ambulatory Visit: Payer: Medicare Other | Admitting: Vascular Surgery

## 2012-08-20 ENCOUNTER — Encounter: Payer: Self-pay | Admitting: Vascular Surgery

## 2012-08-21 ENCOUNTER — Ambulatory Visit (INDEPENDENT_AMBULATORY_CARE_PROVIDER_SITE_OTHER): Payer: Medicare Other | Admitting: Vascular Surgery

## 2012-08-21 ENCOUNTER — Encounter: Payer: Self-pay | Admitting: Vascular Surgery

## 2012-08-21 ENCOUNTER — Other Ambulatory Visit: Payer: Self-pay | Admitting: *Deleted

## 2012-08-21 VITALS — BP 165/47 | HR 78 | Resp 18 | Ht 64.0 in | Wt 130.4 lb

## 2012-08-21 DIAGNOSIS — I739 Peripheral vascular disease, unspecified: Secondary | ICD-10-CM

## 2012-08-21 DIAGNOSIS — L98499 Non-pressure chronic ulcer of skin of other sites with unspecified severity: Secondary | ICD-10-CM

## 2012-08-21 NOTE — Progress Notes (Signed)
The patient has today for continued followup of her right below-knee amputation. He was last seen on 07/31/12. She is set using the VAC dressing. He continues to have significant discomfort particularly with VAC dressing change.  On physical exam she does have a very nice granulation base of her wound. Unfortunately she continues to have an enlarging area of the skin breakdown. I did debride this in the office today and there is a slight amount of fascia exposed with no evidence of deep necrotic tissue below this.  Had a long discussion with the patient and her husband present. I explained that certainly concerning that she continues to have enlarging area of skin breakdown rather than contracting. She has had flow to her femoral area and no in-line flow below this.  I have referred her to the [facility] long wound center for consideration of hyperbaric treatment. I explained that this may be enough to allow Francesca Jewett to progress. There were no other surgical options and no specific local care in addition to what she is receiving 2 progressed to healing. We will schedule this evaluation as soon as possible and I will see her in 2 weeks  Was given a prescription for Demerol 50 mg #40 when necessary for pain

## 2012-08-28 ENCOUNTER — Encounter (HOSPITAL_BASED_OUTPATIENT_CLINIC_OR_DEPARTMENT_OTHER): Payer: Medicare Other | Attending: General Surgery

## 2012-08-28 ENCOUNTER — Telehealth: Payer: Self-pay | Admitting: Vascular Surgery

## 2012-08-28 DIAGNOSIS — I10 Essential (primary) hypertension: Secondary | ICD-10-CM | POA: Insufficient documentation

## 2012-08-28 DIAGNOSIS — Z79899 Other long term (current) drug therapy: Secondary | ICD-10-CM | POA: Insufficient documentation

## 2012-08-28 DIAGNOSIS — I7300 Raynaud's syndrome without gangrene: Secondary | ICD-10-CM | POA: Insufficient documentation

## 2012-08-28 DIAGNOSIS — T8789 Other complications of amputation stump: Secondary | ICD-10-CM | POA: Insufficient documentation

## 2012-08-28 DIAGNOSIS — E119 Type 2 diabetes mellitus without complications: Secondary | ICD-10-CM | POA: Insufficient documentation

## 2012-08-28 DIAGNOSIS — Z87891 Personal history of nicotine dependence: Secondary | ICD-10-CM | POA: Insufficient documentation

## 2012-08-28 DIAGNOSIS — Y835 Amputation of limb(s) as the cause of abnormal reaction of the patient, or of later complication, without mention of misadventure at the time of the procedure: Secondary | ICD-10-CM | POA: Insufficient documentation

## 2012-08-28 DIAGNOSIS — Z7902 Long term (current) use of antithrombotics/antiplatelets: Secondary | ICD-10-CM | POA: Insufficient documentation

## 2012-08-28 NOTE — Telephone Encounter (Addendum)
Message copied by Shari Prows on Tue Aug 28, 2012  3:04 PM ------      Message from: Phillips Odor      Created: Tue Aug 28, 2012  2:26 PM      Regarding: change appt.       Please move appt. to 3/11 @ 3:45 pm.  [facility], and wants to move her appt. W/ Dr. Arbie Cookey out one more week. I have given her the date/time for appt. Change. Thank you. ------ I rescheduled appt for the above pt and also called her to notify her/awt

## 2012-08-29 NOTE — H&P (Signed)
[name], [name]             ACCOUNT NO.:  [id number]  MEDICAL RECORD NO.:  [id number]  LOCATION:  FOOT                         FACILITY:  [facility]  PHYSICIAN:  Joanne Gavel, M.D.        DATE OF BIRTH:  1945-12-07  DATE OF ADMISSION:  08/28/2012 DATE OF DISCHARGE:                             HISTORY & PHYSICAL   CHIEF COMPLAINT:  Wound in right leg.  HISTORY OF PRESENT ILLNESS:  This is a 67 year old female with a long history of cigarette smoking and diabetes.  She has had multiple vascular procedures ending in October 2013 with a right BKA.  This has failed to heal, and the wound is enlarging in spite of VAC treatment.  PAST MEDICAL HISTORY:  Significant for: 1. Chest pain. 2. Hypertension. 3. Diabetes. 4. Cataracts. 5. Raynaud disease.  PAST SURGICAL HISTORY:  Multiple femoral-popliteal bypasses, stent placements, angiograms, and angioplasties,  cardiac stent placement, carotid obstruction surgery, lumpectomy breast, tonsillectomy, adenoidectomy, and hysterectomy.  SOCIAL HISTORY:  Cigarettes, she was a heavy smoker but has not smoked for 5 years.  Alcohol occasionally.  MEDICATIONS:  Alprazolam, Plavix, Dexilant, Lasix, Imdur, metoprolol, Lovaza II, Altace, Actos, Januvia and Crestor.  ALLERGIES:  CODEINE, STATIN, ERYTHROMYCIN, ACETAMINOPHEN, VICODIN, EGGS, HYDROMORPHONE, and SHELL FISH.  REVIEW OF SYSTEMS:  Essentially as above.  PHYSICAL EXAMINATION:  VITAL SIGNS:  Temperature 97.5, pulse 96, respirations 18, blood pressure 145/76, glucose is 370. GENERAL APPEARANCE:  A well developed, well nourished, in no distress. HEAD, EYES, EARS, NOSE, THROAT:  Normal. NECK:  Supple. CHEST:  Clear. HEART:  Regular rhythm. EXTREMITIES:  Examination of the right BKA stump reveals a 4.8 x 12 cm wound about 1.3 cm deep at its deepest part.  There was a great deal of healthy-looking tissue but also particularly in the medial aspect, a great deal of necrotic subcutaneous  tissue.  IMPRESSION:  Failure to heal of BKA stump.  TREATMENT:  We started with sharp debridement of some necrotic subcutaneous tissue.  We will have every other day Santyl and stop the [facility] for a week or two and see if we can clean the wound up a little bit more.  She may ultimately be a candidate for hyperbaric if we do not make any progress.     Joanne Gavel, M.D.     RA/MEDQ  D:  08/28/2012  T:  08/29/2012  Job:  [id number]

## 2012-08-30 ENCOUNTER — Telehealth: Payer: Self-pay | Admitting: Cardiovascular Disease

## 2012-08-30 NOTE — Telephone Encounter (Signed)
New Problem    Debra from [facility] called about pt's Plavix, just a courtesy call to let you know it  was approved on  2/26 and is approved for 75yr. If any questions please call [phone].

## 2012-09-04 ENCOUNTER — Other Ambulatory Visit (HOSPITAL_COMMUNITY): Payer: Self-pay | Admitting: General Surgery

## 2012-09-04 ENCOUNTER — Ambulatory Visit: Payer: Medicare Other | Admitting: Vascular Surgery

## 2012-09-04 ENCOUNTER — Encounter (HOSPITAL_BASED_OUTPATIENT_CLINIC_OR_DEPARTMENT_OTHER): Payer: Medicare Other | Attending: General Surgery

## 2012-09-04 DIAGNOSIS — L97809 Non-pressure chronic ulcer of other part of unspecified lower leg with unspecified severity: Secondary | ICD-10-CM | POA: Insufficient documentation

## 2012-09-04 DIAGNOSIS — Y835 Amputation of limb(s) as the cause of abnormal reaction of the patient, or of later complication, without mention of misadventure at the time of the procedure: Secondary | ICD-10-CM | POA: Insufficient documentation

## 2012-09-04 DIAGNOSIS — T8789 Other complications of amputation stump: Secondary | ICD-10-CM | POA: Insufficient documentation

## 2012-09-04 DIAGNOSIS — I509 Heart failure, unspecified: Secondary | ICD-10-CM

## 2012-09-04 DIAGNOSIS — E1169 Type 2 diabetes mellitus with other specified complication: Secondary | ICD-10-CM | POA: Insufficient documentation

## 2012-09-10 ENCOUNTER — Other Ambulatory Visit (HOSPITAL_BASED_OUTPATIENT_CLINIC_OR_DEPARTMENT_OTHER): Payer: Self-pay | Admitting: General Surgery

## 2012-09-10 ENCOUNTER — Ambulatory Visit (HOSPITAL_COMMUNITY)
Admission: RE | Admit: 2012-09-10 | Discharge: 2012-09-10 | Disposition: A | Discharge status: Home / Self Care | Payer: Medicare Other | Source: Ambulatory Visit | Attending: General Surgery | Admitting: General Surgery

## 2012-09-10 ENCOUNTER — Encounter: Payer: Self-pay | Admitting: Vascular Surgery

## 2012-09-10 DIAGNOSIS — T148XXA Other injury of unspecified body region, initial encounter: Secondary | ICD-10-CM

## 2012-09-10 DIAGNOSIS — I2510 Atherosclerotic heart disease of native coronary artery without angina pectoris: Secondary | ICD-10-CM | POA: Insufficient documentation

## 2012-09-10 DIAGNOSIS — I509 Heart failure, unspecified: Secondary | ICD-10-CM

## 2012-09-10 DIAGNOSIS — I10 Essential (primary) hypertension: Secondary | ICD-10-CM | POA: Insufficient documentation

## 2012-09-10 DIAGNOSIS — E785 Hyperlipidemia, unspecified: Secondary | ICD-10-CM | POA: Insufficient documentation

## 2012-09-10 DIAGNOSIS — E119 Type 2 diabetes mellitus without complications: Secondary | ICD-10-CM | POA: Insufficient documentation

## 2012-09-10 DIAGNOSIS — I080 Rheumatic disorders of both mitral and aortic valves: Secondary | ICD-10-CM | POA: Insufficient documentation

## 2012-09-10 NOTE — Progress Notes (Signed)
*  PRELIMINARY RESULTS* Echocardiogram 2D Echocardiogram has been performed.  Jeryl Columbia 09/10/2012, 12:44 PM

## 2012-09-11 ENCOUNTER — Ambulatory Visit (INDEPENDENT_AMBULATORY_CARE_PROVIDER_SITE_OTHER): Payer: Medicare Other | Admitting: Vascular Surgery

## 2012-09-11 ENCOUNTER — Encounter: Payer: Self-pay | Admitting: Vascular Surgery

## 2012-09-11 VITALS — BP 120/53 | HR 78 | Resp 18 | Ht 64.0 in | Wt 129.7 lb

## 2012-09-11 DIAGNOSIS — I70219 Atherosclerosis of native arteries of extremities with intermittent claudication, unspecified extremity: Secondary | ICD-10-CM

## 2012-09-11 NOTE — Progress Notes (Signed)
Patient presents today for a followup of her right below-knee patient. I seen her 3 weeks ago. She has been seen in the wound and hyperbaric center. She is been changed to Santyl wound debridement ointment. He is also having sharp debris and and also is initiated hyperbaric treatment.  Past Medical History  Diagnosis Date  . Hypertension     Unspecified  . Hyperlipidemia     Mixed  . Tobacco abuse     Remote  . Loose, teeth     has loose bridge and two loose teeth holding it  . PONV (postoperative nausea and vomiting)   . Type II diabetes mellitus   . GERD (gastroesophageal reflux disease)   . Anxiety   . Peripheral vascular disease     unspecified, a. s/p L CEA b. s/p B fem-pop bypass  . Raynaud disease   . Stones in the urinary tract   . UTI (urinary tract infection)   . Anemia     hx  . Coronary artery disease     a. s/p NSTEMI 02/2009 - PCI LCX with Xience DES. Otherwise branch vessel and Dist. RCA dzs. NL EF.   Marland Kitchen Myocardial infarction   . CHF (congestive heart failure)   . Kidney stones   . UTI (lower urinary tract infection)   . Constipation   . Hernia, umbilical   . History of blood transfusion   . Fall from slipping on wet surface Jan. 7, 2014    Right stump bleeding from fall    History  Substance Use Topics  . Smoking status: Former Smoker -- 2.00 packs/day for 39 years    Types: Cigarettes    Quit date: 07/05/2001  . Smokeless tobacco: Never Used  . Alcohol Use: 7.0 oz/week    14 drink(s) per week    Family History  Problem Relation Age of Onset  . Heart attack Mother   . Heart attack Father   . Hypertension Brother   . Diabetes Brother   . Hypertension Brother     Allergies  Allergen Reactions  . Codeine Nausea And Vomiting  . Erythromycin Nausea And Vomiting  . Hydrocodone-Acetaminophen Nausea And Vomiting  . Hydromorphone Nausea And Vomiting  . Propoxyphene Hcl Nausea And Vomiting  . Statins Other (See Comments)    Leg cramps  .  Acetaminophen Other (See Comments)    Does not tolerate well, nausea  . Eggs Or Egg-Derived Products     "pt does not eat"  . Ibuprofen Other (See Comments)    Does not tolerate well  . Shellfish-Derived Products     "pt does not eat"    Current outpatient prescriptions:ALPRAZolam (XANAX) 0.25 MG tablet, Take 1 tablet (0.25 mg total) by mouth 3 (three) times daily as needed for anxiety., Disp: 60 tablet, Rfl: 0;  aspirin 325 MG EC tablet, Take 325 mg by mouth every morning. , Disp: , Rfl: ;  calcium carbonate (TUMS) 500 MG chewable tablet, Chew 1-4 tablets by mouth daily as needed. For acid reflux., Disp: , Rfl:  Cholecalciferol 25000 UNITS CAPS, Take 25,000 Units by mouth 3 (three) times a week. Monday, Wednesday, friday, Disp: , Rfl: ;  ciprofloxacin (CIPRO) 500 MG tablet, Take 500 mg by mouth 2 (two) times daily., Disp: , Rfl: ;  clopidogrel (PLAVIX) 75 MG tablet, Take 75 mg by mouth daily., Disp: , Rfl: ;  dexlansoprazole (DEXILANT) 60 MG capsule, Take 60 mg by mouth 2 (two) times daily., Disp: , Rfl:  furosemide (LASIX)  40 MG tablet, Take 1 tablet (40 mg total) by mouth daily., Disp: 30 tablet, Rfl: 1;  glyBURIDE (DIABETA) 2.5 MG tablet, , Disp: , Rfl: ;  iron polysaccharides (NIFEREX) 150 MG capsule, Take 150 mg by mouth 2 (two) times daily., Disp: , Rfl: ;  isosorbide mononitrate (IMDUR) 60 MG 24 hr tablet, Take 1 tablet (60 mg total) by mouth daily., Disp: 30 tablet, Rfl: 1;  KLOR-CON M10 10 MEQ tablet, , Disp: , Rfl:  meperidine (DEMEROL) 50 MG tablet, Take 1 tablet (50 mg total) by mouth every 6 (six) hours as needed. For pain, Disp: 30 tablet, Rfl: 0;  metoprolol (LOPRESSOR) 50 MG tablet, Take 1 tablet (50 mg total) by mouth 2 (two) times daily., Disp: 60 tablet, Rfl: 1;  metoprolol succinate (TOPROL-XL) 25 MG 24 hr tablet, , Disp: , Rfl:  nitroGLYCERIN (NITROSTAT) 0.4 MG SL tablet, Place 0.4 mg under the tongue every 5 (five) minutes as needed. For chest pain, Disp: , Rfl: ;  omega-3 acid  ethyl esters (LOVAZA) 1 G capsule, Take 2 g by mouth 2 (two) times daily. , Disp: , Rfl: ;  pioglitazone-metformin (ACTOPLUS MET) 15-500 MG per tablet, Take 1 tablet by mouth daily. Brand name only-does not work if components are given separately per patient, Disp: , Rfl:  potassium chloride (K-DUR) 10 MEQ tablet, Take 1 tablet (10 mEq total) by mouth 2 (two) times daily., Disp: 60 tablet, Rfl: 1;  Probiotic Product (ACIDOPHILUS PEARLS PO), Take 1 capsule by mouth daily., Disp: , Rfl: ;  prochlorperazine (COMPAZINE) 10 MG tablet, Take 1 tablet (10 mg total) by mouth every 6 (six) hours as needed. For nausea, Disp: 30 tablet, Rfl: 2 ramipril (ALTACE) 1.25 MG capsule, Take 1 capsule (1.25 mg total) by mouth 2 (two) times daily., Disp: 60 capsule, Rfl: 1;  sitaGLIPtan (JANUVIA) 100 MG tablet, Take 100 mg by mouth at bedtime. , Disp: , Rfl: ;  sulfamethoxazole-trimethoprim (BACTRIM DS) 800-160 MG per tablet, Take 1 tablet by mouth 2 (two) times daily., Disp: , Rfl:  Vitamin D, Ergocalciferol, (DRISDOL) 50000 UNITS CAPS, Take 1 capsule (50,000 Units total) by mouth every 7 (seven) days., Disp: 30 capsule, Rfl: 1  BP 120/53  Pulse 78  Resp 18  Ht 5\' 4"  (1.626 m)  Wt 129 lb 11.2 oz (58.832 kg)  BMI 22.25 kg/m2  Body mass index is 22.25 kg/(m^2).        Her wound looks good today. She is a good granulating base and did a debridement of this morning. She does not have any significant decrease in size.  Impression and plan ongoing issues regarding wound healing of below-knee amputation incision. Her stump itself is completely nontender and I do not see any evidence of infection. She has minimal contracture. She will continue treatment of the wound center will see me again in one month

## 2012-09-18 LAB — GLUCOSE, CAPILLARY
Glucose-Capillary: 259 mg/dL — ABNORMAL HIGH (ref 70–99)
Glucose-Capillary: 312 mg/dL — ABNORMAL HIGH (ref 70–99)
Glucose-Capillary: 258 mg/dL — ABNORMAL HIGH (ref 70–99)

## 2012-09-19 LAB — GLUCOSE, CAPILLARY: Glucose-Capillary: 291 mg/dL — ABNORMAL HIGH (ref 70–99)

## 2012-09-20 LAB — GLUCOSE, CAPILLARY
Glucose-Capillary: 314 mg/dL — ABNORMAL HIGH (ref 70–99)
Glucose-Capillary: 123 mg/dL — ABNORMAL HIGH (ref 70–99)

## 2012-09-21 LAB — GLUCOSE, CAPILLARY
Glucose-Capillary: 288 mg/dL — ABNORMAL HIGH (ref 70–99)
Glucose-Capillary: 214 mg/dL — ABNORMAL HIGH (ref 70–99)

## 2012-09-25 LAB — GLUCOSE, CAPILLARY
Glucose-Capillary: 133 mg/dL — ABNORMAL HIGH (ref 70–99)
Glucose-Capillary: 245 mg/dL — ABNORMAL HIGH (ref 70–99)

## 2012-09-26 LAB — GLUCOSE, CAPILLARY
Glucose-Capillary: 289 mg/dL — ABNORMAL HIGH (ref 70–99)
Glucose-Capillary: 337 mg/dL — ABNORMAL HIGH (ref 70–99)

## 2012-09-27 LAB — GLUCOSE, CAPILLARY: Glucose-Capillary: 367 mg/dL — ABNORMAL HIGH (ref 70–99)

## 2012-09-28 LAB — GLUCOSE, CAPILLARY: Glucose-Capillary: 367 mg/dL — ABNORMAL HIGH (ref 70–99)

## 2012-10-02 ENCOUNTER — Encounter (HOSPITAL_BASED_OUTPATIENT_CLINIC_OR_DEPARTMENT_OTHER): Payer: Medicare Other | Attending: General Surgery

## 2012-10-02 DIAGNOSIS — I798 Other disorders of arteries, arterioles and capillaries in diseases classified elsewhere: Secondary | ICD-10-CM | POA: Insufficient documentation

## 2012-10-02 DIAGNOSIS — T8740 Infection of amputation stump, unspecified extremity: Secondary | ICD-10-CM | POA: Insufficient documentation

## 2012-10-02 DIAGNOSIS — S88119A Complete traumatic amputation at level between knee and ankle, unspecified lower leg, initial encounter: Secondary | ICD-10-CM | POA: Insufficient documentation

## 2012-10-02 DIAGNOSIS — E1159 Type 2 diabetes mellitus with other circulatory complications: Secondary | ICD-10-CM | POA: Insufficient documentation

## 2012-10-02 DIAGNOSIS — L97809 Non-pressure chronic ulcer of other part of unspecified lower leg with unspecified severity: Secondary | ICD-10-CM | POA: Insufficient documentation

## 2012-10-02 DIAGNOSIS — E1169 Type 2 diabetes mellitus with other specified complication: Secondary | ICD-10-CM | POA: Insufficient documentation

## 2012-10-03 LAB — GLUCOSE, CAPILLARY
Glucose-Capillary: 329 mg/dL — ABNORMAL HIGH (ref 70–99)
Glucose-Capillary: 270 mg/dL — ABNORMAL HIGH (ref 70–99)
Glucose-Capillary: 287 mg/dL — ABNORMAL HIGH (ref 70–99)

## 2012-10-04 LAB — GLUCOSE, CAPILLARY
Glucose-Capillary: 307 mg/dL — ABNORMAL HIGH (ref 70–99)
Glucose-Capillary: 299 mg/dL — ABNORMAL HIGH (ref 70–99)

## 2012-10-05 LAB — GLUCOSE, CAPILLARY
Glucose-Capillary: 241 mg/dL — ABNORMAL HIGH (ref 70–99)
Glucose-Capillary: 277 mg/dL — ABNORMAL HIGH (ref 70–99)

## 2012-10-12 LAB — GLUCOSE, CAPILLARY: Glucose-Capillary: 320 mg/dL — ABNORMAL HIGH (ref 70–99)

## 2012-10-15 ENCOUNTER — Encounter: Payer: Self-pay | Admitting: Vascular Surgery

## 2012-10-16 ENCOUNTER — Ambulatory Visit (INDEPENDENT_AMBULATORY_CARE_PROVIDER_SITE_OTHER): Payer: Medicare Other | Admitting: Vascular Surgery

## 2012-10-16 ENCOUNTER — Encounter: Payer: Self-pay | Admitting: Vascular Surgery

## 2012-10-16 VITALS — BP 128/66 | HR 76 | Resp 16 | Ht 64.0 in | Wt 134.0 lb

## 2012-10-16 DIAGNOSIS — I70219 Atherosclerosis of native arteries of extremities with intermittent claudication, unspecified extremity: Secondary | ICD-10-CM

## 2012-10-16 DIAGNOSIS — I739 Peripheral vascular disease, unspecified: Secondary | ICD-10-CM

## 2012-10-16 DIAGNOSIS — Z48812 Encounter for surgical aftercare following surgery on the circulatory system: Secondary | ICD-10-CM

## 2012-10-16 LAB — GLUCOSE, CAPILLARY: Glucose-Capillary: 296 mg/dL — ABNORMAL HIGH (ref 70–99)

## 2012-10-16 NOTE — Progress Notes (Signed)
Patient is a  followup of healing of her right below-knee amputation. She is been seen at the wound center and is having topical treatment and also hyperbaric treatments. He is quite good today and is having a minimal discomfort associated with this. Her below knee of dictation is looking much better than it did one month ago. It is contracting and is much more superficial with minimal necrotic debris present. She has good perfusion of her left foot.  Am quite pleased with her results at the wound center as a patient and her husband present. Will see her again in 6 weeks for continued followup and also for ankle arm indices for documentation of her flow on the left femoropopliteal

## 2012-10-16 NOTE — Addendum Note (Signed)
Addended by: Dannielle Karvonen on: 10/16/2012 04:22 PM   Modules accepted: Orders

## 2012-10-18 LAB — GLUCOSE, CAPILLARY
Glucose-Capillary: 238 mg/dL — ABNORMAL HIGH (ref 70–99)
Glucose-Capillary: 245 mg/dL — ABNORMAL HIGH (ref 70–99)

## 2012-10-22 LAB — GLUCOSE, CAPILLARY
Glucose-Capillary: 291 mg/dL — ABNORMAL HIGH (ref 70–99)
Glucose-Capillary: 244 mg/dL — ABNORMAL HIGH (ref 70–99)

## 2012-10-29 LAB — GLUCOSE, CAPILLARY: Glucose-Capillary: 247 mg/dL — ABNORMAL HIGH (ref 70–99)

## 2012-10-30 LAB — GLUCOSE, CAPILLARY: Glucose-Capillary: 256 mg/dL — ABNORMAL HIGH (ref 70–99)

## 2012-10-31 LAB — GLUCOSE, CAPILLARY
Glucose-Capillary: 294 mg/dL — ABNORMAL HIGH (ref 70–99)
Glucose-Capillary: 241 mg/dL — ABNORMAL HIGH (ref 70–99)

## 2012-11-01 ENCOUNTER — Encounter (HOSPITAL_BASED_OUTPATIENT_CLINIC_OR_DEPARTMENT_OTHER): Payer: Medicare Other | Attending: General Surgery

## 2012-11-01 DIAGNOSIS — S88119A Complete traumatic amputation at level between knee and ankle, unspecified lower leg, initial encounter: Secondary | ICD-10-CM | POA: Insufficient documentation

## 2012-11-01 DIAGNOSIS — L97809 Non-pressure chronic ulcer of other part of unspecified lower leg with unspecified severity: Secondary | ICD-10-CM | POA: Insufficient documentation

## 2012-11-01 DIAGNOSIS — E1169 Type 2 diabetes mellitus with other specified complication: Secondary | ICD-10-CM | POA: Insufficient documentation

## 2012-11-01 LAB — GLUCOSE, CAPILLARY
Glucose-Capillary: 252 mg/dL — ABNORMAL HIGH (ref 70–99)
Glucose-Capillary: 252 mg/dL — ABNORMAL HIGH (ref 70–99)

## 2012-11-23 ENCOUNTER — Encounter: Payer: Self-pay | Admitting: Vascular Surgery

## 2012-11-23 ENCOUNTER — Encounter (INDEPENDENT_AMBULATORY_CARE_PROVIDER_SITE_OTHER): Payer: Medicare Other | Admitting: *Deleted

## 2012-11-23 DIAGNOSIS — I739 Peripheral vascular disease, unspecified: Secondary | ICD-10-CM

## 2012-11-23 DIAGNOSIS — Z48812 Encounter for surgical aftercare following surgery on the circulatory system: Secondary | ICD-10-CM

## 2012-11-23 DIAGNOSIS — I70219 Atherosclerosis of native arteries of extremities with intermittent claudication, unspecified extremity: Secondary | ICD-10-CM

## 2012-11-27 ENCOUNTER — Encounter: Payer: Self-pay | Admitting: Vascular Surgery

## 2012-11-27 ENCOUNTER — Ambulatory Visit (INDEPENDENT_AMBULATORY_CARE_PROVIDER_SITE_OTHER): Payer: Medicare Other | Admitting: Vascular Surgery

## 2012-11-27 VITALS — BP 122/50 | HR 85 | Ht 64.0 in | Wt 132.4 lb

## 2012-11-27 DIAGNOSIS — I739 Peripheral vascular disease, unspecified: Secondary | ICD-10-CM

## 2012-11-27 LAB — GLUCOSE, CAPILLARY: Glucose-Capillary: 347 mg/dL — ABNORMAL HIGH (ref 70–99)

## 2012-11-27 NOTE — Progress Notes (Signed)
Patient has today for continued followup of her diffuse peripheral vascular occlusive disease. Did undergo a recent to have evaluation on 11/23/2012. I reviewed this with the patient and her husband today as well. This shows ankle arm index on the left a 0.98. She does have good flow throughout her left femoral to popliteal bypass and her aortobifemoral bypass.  Moderate elevation and the popliteal artery on the left the lower bypass. This does not appear to be critical at this time.  Past Medical History  Diagnosis Date  . Hypertension     Unspecified  . Hyperlipidemia     Mixed  . Tobacco abuse     Remote  . Loose, teeth     has loose bridge and two loose teeth holding it  . PONV (postoperative nausea and vomiting)   . Type II diabetes mellitus   . GERD (gastroesophageal reflux disease)   . Anxiety   . Peripheral vascular disease     unspecified, a. s/p L CEA b. s/p B fem-pop bypass  . Raynaud disease   . Stones in the urinary tract   . UTI (urinary tract infection)   . Anemia     hx  . Coronary artery disease     a. s/p NSTEMI 02/2009 - PCI LCX with Xience DES. Otherwise branch vessel and Dist. RCA dzs. NL EF.   Marland Kitchen Myocardial infarction   . CHF (congestive heart failure)   . Kidney stones   . UTI (lower urinary tract infection)   . Constipation   . Hernia, umbilical   . History of blood transfusion   . Fall from slipping on wet surface Jan. 7, 2014    Right stump bleeding from fall    History  Substance Use Topics  . Smoking status: Former Smoker -- 2.00 packs/day for 39 years    Types: Cigarettes    Quit date: 07/05/2001  . Smokeless tobacco: Never Used  . Alcohol Use: 7.0 oz/week    14 drink(s) per week    Family History  Problem Relation Age of Onset  . Heart attack Mother   . Heart attack Father   . Hypertension Brother   . Diabetes Brother   . Hypertension Brother     Allergies  Allergen Reactions  . Codeine Nausea And Vomiting  . Erythromycin Nausea  And Vomiting  . Hydrocodone-Acetaminophen Nausea And Vomiting  . Hydromorphone Nausea And Vomiting  . Propoxyphene Hcl Nausea And Vomiting  . Statins Other (See Comments)    Leg cramps  . Acetaminophen Other (See Comments)    Does not tolerate well, nausea  . Eggs Or Egg-Derived Products     "pt does not eat"  . Ibuprofen Other (See Comments)    Does not tolerate well  . Shellfish-Derived Products     "pt does not eat"    Current outpatient prescriptions:ALPRAZolam (XANAX) 0.25 MG tablet, Take 1 tablet (0.25 mg total) by mouth 3 (three) times daily as needed for anxiety., Disp: 60 tablet, Rfl: 0;  aspirin 325 MG EC tablet, Take 325 mg by mouth every morning. , Disp: , Rfl: ;  calcium carbonate (TUMS) 500 MG chewable tablet, Chew 1-4 tablets by mouth daily as needed. For acid reflux., Disp: , Rfl:  Cholecalciferol 25000 UNITS CAPS, Take 25,000 Units by mouth 3 (three) times a week. Monday, Wednesday, friday, Disp: , Rfl: ;  ciprofloxacin (CIPRO) 500 MG tablet, Take 500 mg by mouth 2 (two) times daily., Disp: , Rfl: ;  clopidogrel (PLAVIX)  75 MG tablet, Take 75 mg by mouth daily., Disp: , Rfl: ;  dexlansoprazole (DEXILANT) 60 MG capsule, Take 60 mg by mouth 2 (two) times daily., Disp: , Rfl:  furosemide (LASIX) 40 MG tablet, Take 1 tablet (40 mg total) by mouth daily., Disp: 30 tablet, Rfl: 1;  glyBURIDE (DIABETA) 2.5 MG tablet, , Disp: , Rfl: ;  iron polysaccharides (NIFEREX) 150 MG capsule, Take 150 mg by mouth 2 (two) times daily., Disp: , Rfl: ;  isosorbide mononitrate (IMDUR) 60 MG 24 hr tablet, Take 1 tablet (60 mg total) by mouth daily., Disp: 30 tablet, Rfl: 1;  KLOR-CON M10 10 MEQ tablet, , Disp: , Rfl:  meperidine (DEMEROL) 50 MG tablet, Take 1 tablet (50 mg total) by mouth every 6 (six) hours as needed. For pain, Disp: 30 tablet, Rfl: 0;  metFORMIN (GLUMETZA) 1000 MG (MOD) 24 hr tablet, Take 1,000 mg by mouth daily with breakfast., Disp: , Rfl: ;  metoprolol (LOPRESSOR) 50 MG tablet, Take  1 tablet (50 mg total) by mouth 2 (two) times daily., Disp: 60 tablet, Rfl: 1;  metoprolol succinate (TOPROL-XL) 25 MG 24 hr tablet, , Disp: , Rfl:  nitroGLYCERIN (NITROSTAT) 0.4 MG SL tablet, Place 0.4 mg under the tongue every 5 (five) minutes as needed. For chest pain, Disp: , Rfl: ;  omega-3 acid ethyl esters (LOVAZA) 1 G capsule, Take 2 g by mouth 2 (two) times daily. , Disp: , Rfl: ;  pioglitazone-metformin (ACTOPLUS MET) 15-500 MG per tablet, Take 1 tablet by mouth daily. Brand name only-does not work if components are given separately per patient, Disp: , Rfl:  potassium chloride (K-DUR) 10 MEQ tablet, Take 1 tablet (10 mEq total) by mouth 2 (two) times daily., Disp: 60 tablet, Rfl: 1;  Probiotic Product (ACIDOPHILUS PEARLS PO), Take 1 capsule by mouth daily., Disp: , Rfl: ;  prochlorperazine (COMPAZINE) 10 MG tablet, Take 1 tablet (10 mg total) by mouth every 6 (six) hours as needed. For nausea, Disp: 30 tablet, Rfl: 2 ramipril (ALTACE) 1.25 MG capsule, Take 1 capsule (1.25 mg total) by mouth 2 (two) times daily., Disp: 60 capsule, Rfl: 1;  sitaGLIPtan (JANUVIA) 100 MG tablet, Take 100 mg by mouth at bedtime. , Disp: , Rfl: ;  sulfamethoxazole-trimethoprim (BACTRIM DS) 800-160 MG per tablet, Take 1 tablet by mouth 2 (two) times daily., Disp: , Rfl:  Vitamin D, Ergocalciferol, (DRISDOL) 50000 UNITS CAPS, Take 1 capsule (50,000 Units total) by mouth every 7 (seven) days., Disp: 30 capsule, Rfl: 1  BP 122/50  Pulse 85  Ht 5\' 4"  (1.626 m)  Wt 132 lb 6.4 oz (60.056 kg)  BMI 22.72 kg/m2  SpO2 100%  Body mass index is 22.72 kg/(m^2).       Physical exam she does have a well-perfused foot left. 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 She has been receiving excellent care at the wound center. She has continuing her course of hyperbaric therapy.  Present and plan continued improvement in the right below-knee amputation. Patent left femoropopliteal bypass and patent aortobifemoral  bypass. We will see her again in 6 weeks for continued followup

## 2012-11-28 LAB — GLUCOSE, CAPILLARY
Glucose-Capillary: 268 mg/dL — ABNORMAL HIGH (ref 70–99)
Glucose-Capillary: 244 mg/dL — ABNORMAL HIGH (ref 70–99)

## 2012-11-29 LAB — GLUCOSE, CAPILLARY: Glucose-Capillary: 196 mg/dL — ABNORMAL HIGH (ref 70–99)

## 2012-11-30 LAB — GLUCOSE, CAPILLARY: Glucose-Capillary: 271 mg/dL — ABNORMAL HIGH (ref 70–99)

## 2012-12-03 ENCOUNTER — Encounter (HOSPITAL_BASED_OUTPATIENT_CLINIC_OR_DEPARTMENT_OTHER): Payer: Medicare Other | Attending: General Surgery

## 2012-12-03 DIAGNOSIS — T8789 Other complications of amputation stump: Secondary | ICD-10-CM | POA: Insufficient documentation

## 2012-12-03 DIAGNOSIS — S88119A Complete traumatic amputation at level between knee and ankle, unspecified lower leg, initial encounter: Secondary | ICD-10-CM | POA: Insufficient documentation

## 2012-12-03 DIAGNOSIS — E1169 Type 2 diabetes mellitus with other specified complication: Secondary | ICD-10-CM | POA: Insufficient documentation

## 2012-12-03 DIAGNOSIS — Y835 Amputation of limb(s) as the cause of abnormal reaction of the patient, or of later complication, without mention of misadventure at the time of the procedure: Secondary | ICD-10-CM | POA: Insufficient documentation

## 2012-12-03 DIAGNOSIS — L97809 Non-pressure chronic ulcer of other part of unspecified lower leg with unspecified severity: Secondary | ICD-10-CM | POA: Insufficient documentation

## 2012-12-03 LAB — GLUCOSE, CAPILLARY
Glucose-Capillary: 208 mg/dL — ABNORMAL HIGH (ref 70–99)
Glucose-Capillary: 175 mg/dL — ABNORMAL HIGH (ref 70–99)

## 2012-12-04 LAB — GLUCOSE, CAPILLARY
Glucose-Capillary: 192 mg/dL — ABNORMAL HIGH (ref 70–99)
Glucose-Capillary: 157 mg/dL — ABNORMAL HIGH (ref 70–99)

## 2012-12-05 LAB — GLUCOSE, CAPILLARY
Glucose-Capillary: 252 mg/dL — ABNORMAL HIGH (ref 70–99)
Glucose-Capillary: 239 mg/dL — ABNORMAL HIGH (ref 70–99)

## 2012-12-06 LAB — GLUCOSE, CAPILLARY: Glucose-Capillary: 230 mg/dL — ABNORMAL HIGH (ref 70–99)

## 2012-12-07 LAB — GLUCOSE, CAPILLARY
Glucose-Capillary: 287 mg/dL — ABNORMAL HIGH (ref 70–99)
Glucose-Capillary: 167 mg/dL — ABNORMAL HIGH (ref 70–99)

## 2012-12-10 LAB — GLUCOSE, CAPILLARY
Glucose-Capillary: 219 mg/dL — ABNORMAL HIGH (ref 70–99)
Glucose-Capillary: 171 mg/dL — ABNORMAL HIGH (ref 70–99)

## 2012-12-14 LAB — GLUCOSE, CAPILLARY
Glucose-Capillary: 252 mg/dL — ABNORMAL HIGH (ref 70–99)
Glucose-Capillary: 258 mg/dL — ABNORMAL HIGH (ref 70–99)

## 2012-12-18 LAB — GLUCOSE, CAPILLARY: Glucose-Capillary: 242 mg/dL — ABNORMAL HIGH (ref 70–99)

## 2012-12-19 LAB — GLUCOSE, CAPILLARY: Glucose-Capillary: 193 mg/dL — ABNORMAL HIGH (ref 70–99)

## 2012-12-20 LAB — GLUCOSE, CAPILLARY
Glucose-Capillary: 313 mg/dL — ABNORMAL HIGH (ref 70–99)
Glucose-Capillary: 247 mg/dL — ABNORMAL HIGH (ref 70–99)

## 2012-12-21 LAB — GLUCOSE, CAPILLARY
Glucose-Capillary: 266 mg/dL — ABNORMAL HIGH (ref 70–99)
Glucose-Capillary: 256 mg/dL — ABNORMAL HIGH (ref 70–99)

## 2012-12-24 LAB — GLUCOSE, CAPILLARY: Glucose-Capillary: 221 mg/dL — ABNORMAL HIGH (ref 70–99)

## 2012-12-25 LAB — GLUCOSE, CAPILLARY
Glucose-Capillary: 234 mg/dL — ABNORMAL HIGH (ref 70–99)
Glucose-Capillary: 201 mg/dL — ABNORMAL HIGH (ref 70–99)

## 2012-12-26 LAB — GLUCOSE, CAPILLARY: Glucose-Capillary: 271 mg/dL — ABNORMAL HIGH (ref 70–99)

## 2012-12-27 LAB — GLUCOSE, CAPILLARY: Glucose-Capillary: 184 mg/dL — ABNORMAL HIGH (ref 70–99)

## 2013-01-01 ENCOUNTER — Encounter (HOSPITAL_BASED_OUTPATIENT_CLINIC_OR_DEPARTMENT_OTHER): Payer: Medicare Other | Attending: General Surgery

## 2013-01-01 DIAGNOSIS — E1169 Type 2 diabetes mellitus with other specified complication: Secondary | ICD-10-CM | POA: Insufficient documentation

## 2013-01-01 DIAGNOSIS — L97809 Non-pressure chronic ulcer of other part of unspecified lower leg with unspecified severity: Secondary | ICD-10-CM | POA: Insufficient documentation

## 2013-01-01 DIAGNOSIS — S88119A Complete traumatic amputation at level between knee and ankle, unspecified lower leg, initial encounter: Secondary | ICD-10-CM | POA: Insufficient documentation

## 2013-01-01 LAB — GLUCOSE, CAPILLARY
Glucose-Capillary: 280 mg/dL — ABNORMAL HIGH (ref 70–99)
Glucose-Capillary: 225 mg/dL — ABNORMAL HIGH (ref 70–99)

## 2013-01-02 LAB — GLUCOSE, CAPILLARY
Glucose-Capillary: 293 mg/dL — ABNORMAL HIGH (ref 70–99)
Glucose-Capillary: 218 mg/dL — ABNORMAL HIGH (ref 70–99)

## 2013-01-03 LAB — GLUCOSE, CAPILLARY: Glucose-Capillary: 294 mg/dL — ABNORMAL HIGH (ref 70–99)

## 2013-01-07 LAB — GLUCOSE, CAPILLARY: Glucose-Capillary: 224 mg/dL — ABNORMAL HIGH (ref 70–99)

## 2013-01-08 ENCOUNTER — Ambulatory Visit: Payer: Medicare Other | Admitting: Vascular Surgery

## 2013-01-08 LAB — GLUCOSE, CAPILLARY: Glucose-Capillary: 187 mg/dL — ABNORMAL HIGH (ref 70–99)

## 2013-01-09 LAB — GLUCOSE, CAPILLARY: Glucose-Capillary: 257 mg/dL — ABNORMAL HIGH (ref 70–99)

## 2013-01-21 ENCOUNTER — Encounter: Payer: Self-pay | Admitting: Vascular Surgery

## 2013-01-22 ENCOUNTER — Encounter: Payer: Self-pay | Admitting: Vascular Surgery

## 2013-01-22 ENCOUNTER — Ambulatory Visit (INDEPENDENT_AMBULATORY_CARE_PROVIDER_SITE_OTHER): Payer: Medicare Other | Admitting: Vascular Surgery

## 2013-01-22 VITALS — BP 124/72 | HR 83 | Resp 18 | Ht 64.0 in | Wt 134.2 lb

## 2013-01-22 DIAGNOSIS — I70219 Atherosclerosis of native arteries of extremities with intermittent claudication, unspecified extremity: Secondary | ICD-10-CM

## 2013-01-22 DIAGNOSIS — Z48812 Encounter for surgical aftercare following surgery on the circulatory system: Secondary | ICD-10-CM

## 2013-01-22 DIAGNOSIS — I739 Peripheral vascular disease, unspecified: Secondary | ICD-10-CM

## 2013-01-22 NOTE — Progress Notes (Signed)
Patient is a for a followup of her right below-knee dictation and is also status post aortobifemoral and left femoral-popliteal bypass. She had difficulty healing her below-knee amputation this had been treated at the wound center to include 60 treatments for hyperbaric therapy. Portion she has had continued improvement in his had essentially total healing of the below-knee amputation. She plans to wait additional one month prior to beginning her stump shrinker and a prosthetic fitting. She does have some mild contracture in her right knee and over this can be improved with physical therapy as well. She has no symptoms regarding her left leg. She will continue her ongoing treatment at the wound center and will see [place] again in November 2014 4 protocol surveillance followup of her left femoral to popliteal bypass. They'll notify should she develop any new wound issues

## 2013-02-05 ENCOUNTER — Other Ambulatory Visit: Payer: Self-pay | Admitting: *Deleted

## 2013-02-05 DIAGNOSIS — Z48812 Encounter for surgical aftercare following surgery on the circulatory system: Secondary | ICD-10-CM

## 2013-02-05 DIAGNOSIS — I739 Peripheral vascular disease, unspecified: Secondary | ICD-10-CM

## 2013-02-12 ENCOUNTER — Encounter (HOSPITAL_BASED_OUTPATIENT_CLINIC_OR_DEPARTMENT_OTHER): Payer: Medicare Other | Attending: General Surgery

## 2013-02-12 DIAGNOSIS — Y835 Amputation of limb(s) as the cause of abnormal reaction of the patient, or of later complication, without mention of misadventure at the time of the procedure: Secondary | ICD-10-CM | POA: Insufficient documentation

## 2013-02-12 DIAGNOSIS — T8789 Other complications of amputation stump: Secondary | ICD-10-CM | POA: Insufficient documentation

## 2013-02-12 DIAGNOSIS — E119 Type 2 diabetes mellitus without complications: Secondary | ICD-10-CM | POA: Insufficient documentation

## 2013-02-22 ENCOUNTER — Ambulatory Visit: Payer: Medicare Other | Admitting: Cardiovascular Disease

## 2013-03-04 ENCOUNTER — Other Ambulatory Visit: Payer: Self-pay | Admitting: Cardiovascular Disease

## 2013-03-12 ENCOUNTER — Encounter (HOSPITAL_BASED_OUTPATIENT_CLINIC_OR_DEPARTMENT_OTHER): Payer: Medicare Other | Attending: General Surgery

## 2013-03-12 DIAGNOSIS — Y835 Amputation of limb(s) as the cause of abnormal reaction of the patient, or of later complication, without mention of misadventure at the time of the procedure: Secondary | ICD-10-CM | POA: Insufficient documentation

## 2013-03-12 DIAGNOSIS — T8789 Other complications of amputation stump: Secondary | ICD-10-CM | POA: Insufficient documentation

## 2013-03-19 ENCOUNTER — Ambulatory Visit: Payer: Medicare Other | Attending: Vascular Surgery | Admitting: Physical Therapy

## 2013-03-19 DIAGNOSIS — M6281 Muscle weakness (generalized): Secondary | ICD-10-CM | POA: Insufficient documentation

## 2013-03-19 DIAGNOSIS — S88119A Complete traumatic amputation at level between knee and ankle, unspecified lower leg, initial encounter: Secondary | ICD-10-CM | POA: Insufficient documentation

## 2013-03-19 DIAGNOSIS — R269 Unspecified abnormalities of gait and mobility: Secondary | ICD-10-CM | POA: Insufficient documentation

## 2013-03-19 DIAGNOSIS — IMO0001 Reserved for inherently not codable concepts without codable children: Secondary | ICD-10-CM | POA: Insufficient documentation

## 2013-03-19 DIAGNOSIS — M2560 Stiffness of unspecified joint, not elsewhere classified: Secondary | ICD-10-CM | POA: Insufficient documentation

## 2013-03-19 DIAGNOSIS — R5381 Other malaise: Secondary | ICD-10-CM | POA: Insufficient documentation

## 2013-03-20 ENCOUNTER — Ambulatory Visit: Payer: Medicare Other | Admitting: Physical Therapy

## 2013-03-27 ENCOUNTER — Ambulatory Visit: Payer: Medicare Other | Admitting: Physical Therapy

## 2013-04-02 ENCOUNTER — Encounter: Payer: Self-pay | Admitting: Cardiovascular Disease

## 2013-04-02 ENCOUNTER — Ambulatory Visit (INDEPENDENT_AMBULATORY_CARE_PROVIDER_SITE_OTHER): Payer: Medicare Other | Admitting: Cardiovascular Disease

## 2013-04-02 VITALS — BP 138/65 | HR 75 | Ht 64.0 in | Wt 139.8 lb

## 2013-04-02 DIAGNOSIS — I2510 Atherosclerotic heart disease of native coronary artery without angina pectoris: Secondary | ICD-10-CM

## 2013-04-02 NOTE — Progress Notes (Signed)
HPI:   67 year old woman presenting for followup evaluation. She has coronary and peripheral arterial disease. She's had previous aortobifemoral bypass and had to undergo right BKA last year. She's undergone previous stenting of the left circumflex after presented with non-ST elevation infarction several years ago. Her last heart catheterization was in September 2013 and this demonstrated continued patency of her left circumflex stent with mild nonobstructive disease of the LAD. Her left ventricular function was normal.  She's had no recent cardiac symptoms. She denies chest pain, chest pressure, shortness of breath, or palpitations. She's had a difficult year healing after her BKA. She's had extensive treatment at the wound center. Her BKA stump has now healed and she is going to be fitted for prosthesis in the near future.  Outpatient Encounter Prescriptions as of 04/02/2013  Medication Sig Dispense Refill  . ALPRAZolam (XANAX) 0.25 MG tablet Take 1 tablet (0.25 mg total) by mouth 3 (three) times daily as needed for anxiety.  60 tablet  0  . aspirin 325 MG EC tablet Take 325 mg by mouth every morning.       . calcium carbonate (TUMS) 500 MG chewable tablet Chew 1-4 tablets by mouth daily as needed. For acid reflux.      . Cholecalciferol 25000 UNITS CAPS Take 25,000 Units by mouth 3 (three) times a week. Monday, Wednesday, friday      . ciprofloxacin (CIPRO) 500 MG tablet Take 500 mg by mouth 2 (two) times daily.      . clopidogrel (PLAVIX) 75 MG tablet Take 75 mg by mouth daily.      Marland Kitchen dexlansoprazole (DEXILANT) 60 MG capsule Take 60 mg by mouth 2 (two) times daily.      . furosemide (LASIX) 40 MG tablet Take 1 tablet (40 mg total) by mouth daily.  30 tablet  1  . glyBURIDE (DIABETA) 2.5 MG tablet       . iron polysaccharides (NIFEREX) 150 MG capsule Take 150 mg by mouth 4 (four) times daily.       . isosorbide mononitrate (IMDUR) 60 MG 24 hr tablet Take 1 tablet (60 mg total) by mouth daily.   30 tablet  1  . KLOR-CON M10 10 MEQ tablet       . meperidine (DEMEROL) 50 MG tablet Take 1 tablet (50 mg total) by mouth every 6 (six) hours as needed. For pain  30 tablet  0  . metFORMIN (GLUMETZA) 1000 MG (MOD) 24 hr tablet Take 1,000 mg by mouth daily with breakfast.      . metoprolol (LOPRESSOR) 50 MG tablet Take 1 tablet (50 mg total) by mouth 2 (two) times daily.  60 tablet  1  . metoprolol succinate (TOPROL-XL) 25 MG 24 hr tablet       . nitroGLYCERIN (NITROSTAT) 0.4 MG SL tablet Place 0.4 mg under the tongue every 5 (five) minutes as needed. For chest pain      . omega-3 acid ethyl esters (LOVAZA) 1 G capsule Take 2 g by mouth 2 (two) times daily.       . pioglitazone-metformin (ACTOPLUS MET) 15-500 MG per tablet Take 1 tablet by mouth daily. Brand name only-does not work if components are given separately per patient      . PLAVIX 75 MG tablet TAKE 1 TABLET BY MOUTH EVERY DAY WITH A MEAL  30 tablet  1  . potassium chloride (K-DUR) 10 MEQ tablet Take 1 tablet (10 mEq total) by mouth 2 (two) times daily.  60 tablet  1  . Probiotic Product (ACIDOPHILUS PEARLS PO) Take 1 capsule by mouth daily.      . prochlorperazine (COMPAZINE) 10 MG tablet Take 1 tablet (10 mg total) by mouth every 6 (six) hours as needed. For nausea  30 tablet  2  . ramipril (ALTACE) 1.25 MG capsule Take 1 capsule (1.25 mg total) by mouth 2 (two) times daily.  60 capsule  1  . sitaGLIPtan (JANUVIA) 100 MG tablet Take 100 mg by mouth at bedtime.       . sulfamethoxazole-trimethoprim (BACTRIM DS) 800-160 MG per tablet Take 1 tablet by mouth 2 (two) times daily.      . Vitamin D, Ergocalciferol, (DRISDOL) 50000 UNITS CAPS Take 1 capsule (50,000 Units total) by mouth every 7 (seven) days.  30 capsule  1   No facility-administered encounter medications on file as of 04/02/2013.    Allergies  Allergen Reactions  . Codeine Nausea And Vomiting  . Erythromycin Nausea And Vomiting  . Hydrocodone-Acetaminophen Nausea And  Vomiting  . Hydromorphone Nausea And Vomiting  . Propoxyphene Hcl Nausea And Vomiting  . Statins Other (See Comments)    Leg cramps  . Acetaminophen Other (See Comments)    Does not tolerate well, nausea  . Eggs Or Egg-Derived Products     "pt does not eat"  . Ibuprofen Other (See Comments)    Does not tolerate well  . Shellfish-Derived Products     "pt does not eat"    Past Medical History  Diagnosis Date  . Hypertension     Unspecified  . Hyperlipidemia     Mixed  . Tobacco abuse     Remote  . Loose, teeth     has loose bridge and two loose teeth holding it  . PONV (postoperative nausea and vomiting)   . Type II diabetes mellitus   . GERD (gastroesophageal reflux disease)   . Anxiety   . Peripheral vascular disease     unspecified, a. s/p L CEA b. s/p B fem-pop bypass  . Raynaud disease   . Stones in the urinary tract   . UTI (urinary tract infection)   . Anemia     hx  . Coronary artery disease     a. s/p NSTEMI 02/2009 - PCI LCX with Xience DES. Otherwise branch vessel and Dist. RCA dzs. NL EF.   Marland Kitchen Myocardial infarction   . CHF (congestive heart failure)   . Kidney stones   . UTI (lower urinary tract infection)   . Constipation   . Hernia, umbilical   . History of blood transfusion   . Fall from slipping on wet surface Jan. 7, 2014    Right stump bleeding from fall   BP 138/65  Pulse 75  Ht 5\' 4"  (1.626 m)  Wt 63.413 kg (139 lb 12.8 oz)  BMI 23.98 kg/m2  PHYSICAL EXAM: Pt is alert and oriented, NAD HEENT: normal Neck: JVP - normal, carotids 2+= without bruits Lungs: CTA bilaterally CV: RRR with grade 2/6 high-pitched systolic murmur at both the left and right upper sternal borders Abd: soft, NT, Positive BS, no hepatomegaly Ext: no C/C/E, distal pulses intact and equal Skin: warm/dry no rash  EKG:  Normal sinus rhythm 76 beats per minute, within normal limits.  ASSESSMENT AND PLAN: 1. Coronary artery disease, native vessel. The patient is stable  without anginal symptoms. She will continue on her current medical program. I would like to see her back in one year.  2. Peripheral arterial disease. Followed by Dr. Arbie Cookey. In addition to her lower extremity disease, she has left subclavian stenosis. While this is clearly of hemodynamic significance, she is asymptomatic. BP should only be checked in her right arm.  3. Hyperlipidemia - followed by Dr Evlyn Kanner. Statin-intolerant.  4. Chronic diastolic heart failure. Continue furosemide, ramipril, and metoprolol. Appears euvolemic on exam. LVEDP was high at cardiac cath last year, but no limitation from dyspnea at present.   5. HTN - BP controlled on current medical therapy.  Tonny Bollman 04/02/2013 12:02 PM

## 2013-04-02 NOTE — Patient Instructions (Addendum)
Your physician recommends that you continue on your current medications as directed. Please refer to the Current Medication list given to you today.  Your physician wants you to follow-up in: 1 YEAR with Dr Cooper.  You will receive a reminder letter in the mail two months in advance. If you don't receive a letter, please call our office to schedule the follow-up appointment.  

## 2013-04-03 ENCOUNTER — Encounter: Payer: Medicare Other | Admitting: Physical Therapy

## 2013-04-28 ENCOUNTER — Other Ambulatory Visit: Payer: Self-pay | Admitting: Cardiovascular Disease

## 2013-05-21 ENCOUNTER — Ambulatory Visit: Payer: Medicare Other | Admitting: Vascular Surgery

## 2013-06-10 ENCOUNTER — Encounter: Payer: Self-pay | Admitting: Vascular Surgery

## 2013-06-11 ENCOUNTER — Ambulatory Visit (INDEPENDENT_AMBULATORY_CARE_PROVIDER_SITE_OTHER): Payer: Medicare Other | Admitting: Vascular Surgery

## 2013-06-11 ENCOUNTER — Encounter (HOSPITAL_COMMUNITY): Payer: Medicare Other

## 2013-06-11 ENCOUNTER — Encounter: Payer: Self-pay | Admitting: Vascular Surgery

## 2013-06-11 ENCOUNTER — Ambulatory Visit (INDEPENDENT_AMBULATORY_CARE_PROVIDER_SITE_OTHER)
Admission: RE | Admit: 2013-06-11 | Discharge: 2013-06-11 | Disposition: A | Discharge status: Home / Self Care | Payer: Medicare Other | Source: Ambulatory Visit | Attending: Vascular Surgery | Admitting: Vascular Surgery

## 2013-06-11 ENCOUNTER — Ambulatory Visit (HOSPITAL_COMMUNITY)
Admission: RE | Admit: 2013-06-11 | Discharge: 2013-06-11 | Disposition: A | Discharge status: Home / Self Care | Payer: Medicare Other | Source: Ambulatory Visit | Attending: Vascular Surgery | Admitting: Vascular Surgery

## 2013-06-11 VITALS — BP 129/75 | HR 73 | Resp 16 | Ht 64.0 in | Wt 137.0 lb

## 2013-06-11 DIAGNOSIS — Z48812 Encounter for surgical aftercare following surgery on the circulatory system: Secondary | ICD-10-CM | POA: Insufficient documentation

## 2013-06-11 DIAGNOSIS — I739 Peripheral vascular disease, unspecified: Secondary | ICD-10-CM | POA: Insufficient documentation

## 2013-06-12 NOTE — Progress Notes (Signed)
The patient presents today for followup of her right below-knee amputation and left femoropopliteal bypass. She is now completely healed her amputation after multiple visits the wound center with hyperbaric oxygen therapy. She is working with the prosthetic system.  Past Medical History  Diagnosis Date  . Hypertension     Unspecified  . Hyperlipidemia     Mixed  . Tobacco abuse     Remote  . Loose, teeth     has loose bridge and two loose teeth holding it  . PONV (postoperative nausea and vomiting)   . Type II diabetes mellitus   . GERD (gastroesophageal reflux disease)   . Anxiety   . Peripheral vascular disease     unspecified, a. s/p L CEA b. s/p B fem-pop bypass  . Raynaud disease   . Stones in the urinary tract   . UTI (urinary tract infection)   . Anemia     hx  . Coronary artery disease     a. s/p NSTEMI 02/2009 - PCI LCX with Xience DES. Otherwise branch vessel and Dist. RCA dzs. NL EF.   Marland Kitchen Myocardial infarction   . CHF (congestive heart failure)   . Kidney stones   . UTI (lower urinary tract infection)   . Constipation   . Hernia, umbilical   . History of blood transfusion   . Fall from slipping on wet surface Jan. 7, 2014    Right stump bleeding from fall    History  Substance Use Topics  . Smoking status: Former Smoker -- 2.00 packs/day for 39 years    Types: Cigarettes    Quit date: 07/05/2001  . Smokeless tobacco: Never Used  . Alcohol Use: 7.0 oz/week    14 drink(s) per week    Family History  Problem Relation Age of Onset  . Heart attack Mother   . Heart attack Father   . Hypertension Brother   . Diabetes Brother   . Hypertension Brother     Allergies  Allergen Reactions  . Codeine Nausea And Vomiting  . Erythromycin Nausea And Vomiting  . Hydrocodone-Acetaminophen Nausea And Vomiting  . Hydromorphone Nausea And Vomiting  . Propoxyphene Hcl Nausea And Vomiting  . Statins Other (See Comments)    Leg cramps  . Acetaminophen Other (See  Comments)    Does not tolerate well, nausea  . Eggs Or Egg-Derived Products     "pt does not eat"  . Ibuprofen Other (See Comments)    Does not tolerate well  . Shellfish-Derived Products     "pt does not eat"    Current outpatient prescriptions:ALPRAZolam (XANAX) 0.25 MG tablet, Take 1 tablet (0.25 mg total) by mouth 3 (three) times daily as needed for anxiety., Disp: 60 tablet, Rfl: 0;  aspirin 325 MG EC tablet, Take 325 mg by mouth every morning. , Disp: , Rfl: ;  calcium carbonate (TUMS) 500 MG chewable tablet, Chew 1-4 tablets by mouth daily as needed. For acid reflux., Disp: , Rfl:  Cholecalciferol 25000 UNITS CAPS, Take 25,000 Units by mouth 3 (three) times a week. Monday, Wednesday, friday, Disp: , Rfl: ;  clopidogrel (PLAVIX) 75 MG tablet, Take 75 mg by mouth daily., Disp: , Rfl: ;  dexlansoprazole (DEXILANT) 60 MG capsule, Take 60 mg by mouth 2 (two) times daily., Disp: , Rfl: ;  furosemide (LASIX) 40 MG tablet, Take 1 tablet (40 mg total) by mouth daily., Disp: 30 tablet, Rfl: 1 iron polysaccharides (NIFEREX) 150 MG capsule, Take 150 mg by mouth  4 (four) times daily. , Disp: , Rfl: ;  isosorbide mononitrate (IMDUR) 60 MG 24 hr tablet, Take 1 tablet (60 mg total) by mouth daily., Disp: 30 tablet, Rfl: 1;  KLOR-CON M10 10 MEQ tablet, , Disp: , Rfl: ;  metFORMIN (GLUMETZA) 1000 MG (MOD) 24 hr tablet, Take 1,000 mg by mouth daily with breakfast., Disp: , Rfl:  metoprolol (LOPRESSOR) 50 MG tablet, Take 1 tablet (50 mg total) by mouth 2 (two) times daily., Disp: 60 tablet, Rfl: 1;  nitroGLYCERIN (NITROSTAT) 0.4 MG SL tablet, Place 0.4 mg under the tongue every 5 (five) minutes as needed. For chest pain, Disp: , Rfl: ;  omega-3 acid ethyl esters (LOVAZA) 1 G capsule, Take 2 g by mouth 2 (two) times daily. , Disp: , Rfl:  pioglitazone-metformin (ACTOPLUS MET) 15-500 MG per tablet, Take 1 tablet by mouth daily. Brand name only-does not work if components are given separately per patient, Disp: , Rfl:  ;  PLAVIX 75 MG tablet, TAKE 1 TABLET BY MOUTH EVERY DAY WITH A MEAL, Disp: 30 tablet, Rfl: 5;  potassium chloride (K-DUR) 10 MEQ tablet, Take 1 tablet (10 mEq total) by mouth 2 (two) times daily., Disp: 60 tablet, Rfl: 1 Probiotic Product (ACIDOPHILUS PEARLS PO), Take 1 capsule by mouth daily., Disp: , Rfl: ;  ramipril (ALTACE) 1.25 MG capsule, Take 1 capsule (1.25 mg total) by mouth 2 (two) times daily., Disp: 60 capsule, Rfl: 1;  sitaGLIPtan (JANUVIA) 100 MG tablet, Take 100 mg by mouth at bedtime. , Disp: , Rfl: ;  ciprofloxacin (CIPRO) 500 MG tablet, Take 500 mg by mouth 2 (two) times daily., Disp: , Rfl: ;  glyBURIDE (DIABETA) 2.5 MG tablet, , Disp: , Rfl:  meperidine (DEMEROL) 50 MG tablet, Take 1 tablet (50 mg total) by mouth every 6 (six) hours as needed. For pain, Disp: 30 tablet, Rfl: 0;  metoprolol succinate (TOPROL-XL) 25 MG 24 hr tablet, , Disp: , Rfl: ;  prochlorperazine (COMPAZINE) 10 MG tablet, Take 1 tablet (10 mg total) by mouth every 6 (six) hours as needed. For nausea, Disp: 30 tablet, Rfl: 2 sulfamethoxazole-trimethoprim (BACTRIM DS) 800-160 MG per tablet, Take 1 tablet by mouth 2 (two) times daily., Disp: , Rfl: ;  Vitamin D, Ergocalciferol, (DRISDOL) 50000 UNITS CAPS, Take 1 capsule (50,000 Units total) by mouth every 7 (seven) days., Disp: 30 capsule, Rfl: 1  BP 129/75  Pulse 73  Resp 16  Ht 5\' 4"  (1.626 m)  Wt 137 lb (62.143 kg)  BMI 23.50 kg/m2  Body mass index is 23.5 kg/(m^2).       Physical exam well-developed well-nourished female in no acute distress in a wheelchair  Palpable left femoral pulse. Left foot well-perfused. Right BKA healed  Vascular lab today reveals patency of her left femoropopliteal bypass with some disease in the popliteal artery below the anastomosis. Bypass itself was widely patent  Stable overall. I continued to work with the prosthetic clinic. Will be seen again in 6 months. Noninvasive studies

## 2013-07-02 ENCOUNTER — Other Ambulatory Visit: Payer: Self-pay | Admitting: *Deleted

## 2013-07-02 ENCOUNTER — Ambulatory Visit: Payer: Medicare Other | Attending: Vascular Surgery | Admitting: Physical Therapy

## 2013-07-02 DIAGNOSIS — M6281 Muscle weakness (generalized): Secondary | ICD-10-CM | POA: Insufficient documentation

## 2013-07-02 DIAGNOSIS — M2560 Stiffness of unspecified joint, not elsewhere classified: Secondary | ICD-10-CM | POA: Insufficient documentation

## 2013-07-02 DIAGNOSIS — R5381 Other malaise: Secondary | ICD-10-CM | POA: Insufficient documentation

## 2013-07-02 DIAGNOSIS — I739 Peripheral vascular disease, unspecified: Secondary | ICD-10-CM

## 2013-07-02 DIAGNOSIS — S88119A Complete traumatic amputation at level between knee and ankle, unspecified lower leg, initial encounter: Secondary | ICD-10-CM | POA: Insufficient documentation

## 2013-07-02 DIAGNOSIS — R269 Unspecified abnormalities of gait and mobility: Secondary | ICD-10-CM | POA: Insufficient documentation

## 2013-07-02 DIAGNOSIS — IMO0001 Reserved for inherently not codable concepts without codable children: Secondary | ICD-10-CM | POA: Insufficient documentation

## 2013-07-10 ENCOUNTER — Ambulatory Visit: Payer: Medicare Other | Attending: Vascular Surgery | Admitting: Physical Therapy

## 2013-07-10 DIAGNOSIS — M2560 Stiffness of unspecified joint, not elsewhere classified: Secondary | ICD-10-CM | POA: Insufficient documentation

## 2013-07-10 DIAGNOSIS — R269 Unspecified abnormalities of gait and mobility: Secondary | ICD-10-CM | POA: Insufficient documentation

## 2013-07-10 DIAGNOSIS — IMO0001 Reserved for inherently not codable concepts without codable children: Secondary | ICD-10-CM | POA: Insufficient documentation

## 2013-07-10 DIAGNOSIS — M6281 Muscle weakness (generalized): Secondary | ICD-10-CM | POA: Insufficient documentation

## 2013-07-10 DIAGNOSIS — S88119A Complete traumatic amputation at level between knee and ankle, unspecified lower leg, initial encounter: Secondary | ICD-10-CM | POA: Insufficient documentation

## 2013-07-10 DIAGNOSIS — R5381 Other malaise: Secondary | ICD-10-CM | POA: Insufficient documentation

## 2013-07-12 ENCOUNTER — Ambulatory Visit: Payer: Medicare Other | Admitting: Physical Therapy

## 2013-07-16 ENCOUNTER — Ambulatory Visit: Payer: Medicare Other | Admitting: Physical Therapy

## 2013-07-18 ENCOUNTER — Ambulatory Visit: Payer: Medicare Other | Admitting: Physical Therapy

## 2013-07-19 ENCOUNTER — Encounter: Payer: Medicare Other | Admitting: Physical Therapy

## 2013-07-22 ENCOUNTER — Ambulatory Visit: Payer: Medicare Other | Admitting: Physical Therapy

## 2013-07-24 ENCOUNTER — Encounter: Payer: Medicare Other | Admitting: Physical Therapy

## 2013-07-25 ENCOUNTER — Ambulatory Visit: Payer: Medicare Other | Admitting: Physical Therapy

## 2013-07-29 ENCOUNTER — Ambulatory Visit: Payer: Medicare Other | Admitting: Physical Therapy

## 2013-07-30 ENCOUNTER — Encounter: Payer: Medicare Other | Admitting: Physical Therapy

## 2013-07-31 ENCOUNTER — Ambulatory Visit: Payer: Medicare Other | Admitting: Physical Therapy

## 2013-08-02 ENCOUNTER — Encounter: Payer: Medicare Other | Admitting: Physical Therapy

## 2013-08-02 ENCOUNTER — Telehealth: Payer: Self-pay | Admitting: *Deleted

## 2013-08-02 NOTE — Telephone Encounter (Signed)
Mrs. Sooter states she was seen by Dr. Arbie Cookey about a month ago and at the time had an ulcer on the bottom of her left great toe.   Dr. Arbie Cookey advised her to apply moisturizer on the left great toe.  Mrs. Aegerter states that the ulcer on the left great toe is not healing and is painful.  She denies drainage and erythema.  Appointment made for pt. to see Dr. Arbie Cookey on 08-06-2013 at 9:30AM.  Pt. notified by telephone of appointment date and time.

## 2013-08-05 ENCOUNTER — Encounter: Payer: Self-pay | Admitting: Vascular Surgery

## 2013-08-06 ENCOUNTER — Encounter: Payer: Self-pay | Admitting: Vascular Surgery

## 2013-08-06 ENCOUNTER — Ambulatory Visit: Payer: Medicare Other | Attending: Vascular Surgery | Admitting: Physical Therapy

## 2013-08-06 ENCOUNTER — Ambulatory Visit (INDEPENDENT_AMBULATORY_CARE_PROVIDER_SITE_OTHER): Payer: Medicare Other | Admitting: Vascular Surgery

## 2013-08-06 VITALS — BP 128/68 | HR 83 | Resp 18 | Ht 64.0 in | Wt 137.0 lb

## 2013-08-06 DIAGNOSIS — IMO0001 Reserved for inherently not codable concepts without codable children: Secondary | ICD-10-CM | POA: Insufficient documentation

## 2013-08-06 DIAGNOSIS — Z48812 Encounter for surgical aftercare following surgery on the circulatory system: Secondary | ICD-10-CM

## 2013-08-06 DIAGNOSIS — I739 Peripheral vascular disease, unspecified: Secondary | ICD-10-CM

## 2013-08-06 DIAGNOSIS — M2560 Stiffness of unspecified joint, not elsewhere classified: Secondary | ICD-10-CM | POA: Insufficient documentation

## 2013-08-06 DIAGNOSIS — R269 Unspecified abnormalities of gait and mobility: Secondary | ICD-10-CM | POA: Insufficient documentation

## 2013-08-06 DIAGNOSIS — R5381 Other malaise: Secondary | ICD-10-CM | POA: Insufficient documentation

## 2013-08-06 DIAGNOSIS — M6281 Muscle weakness (generalized): Secondary | ICD-10-CM | POA: Insufficient documentation

## 2013-08-06 DIAGNOSIS — S88119A Complete traumatic amputation at level between knee and ankle, unspecified lower leg, initial encounter: Secondary | ICD-10-CM | POA: Insufficient documentation

## 2013-08-06 NOTE — Progress Notes (Signed)
Patient has today for discussion regarding ulceration on the tip of her left great toe. Have seen her on 06/11/2013 for followup of her left femoral-popliteal bypass. She also has a patent aortobifemoral bypass. 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 She looks quite good. She is wearing her right below-knee prosthesis and reports that she is making progress in walking with a walker with physical therapy. She is in a wheelchair today.  Past Medical History  Diagnosis Date  . Hypertension     Unspecified  . Hyperlipidemia     Mixed  . Tobacco abuse     Remote  . Loose, teeth     has loose bridge and two loose teeth holding it  . PONV (postoperative nausea and vomiting)   . Type II diabetes mellitus   . GERD (gastroesophageal reflux disease)   . Anxiety   . Peripheral vascular disease     unspecified, a. s/p L CEA b. s/p B fem-pop bypass  . Raynaud disease   . Stones in the urinary tract   . UTI (urinary tract infection)   . Anemia     hx  . Coronary artery disease     a. s/p NSTEMI 02/2009 - PCI LCX with Xience DES. Otherwise branch vessel and Dist. RCA dzs. NL EF.   Marland Kitchen Myocardial infarction   . CHF (congestive heart failure)   . Kidney stones   . UTI (lower urinary tract infection)   . Constipation   . Hernia, umbilical   . History of blood transfusion   . Fall from slipping on wet surface Jan. 7, 2014    Right stump bleeding from fall    History  Substance Use Topics  . Smoking status: Former Smoker -- 2.00 packs/day for 39 years    Types: Cigarettes    Quit date: 07/05/2001  . Smokeless tobacco: Never Used  . Alcohol Use: 7.0 oz/week    14 drink(s) per week    Family History  Problem Relation Age of Onset  . Heart attack Mother   . Heart attack Father   . Hypertension Brother   . Diabetes Brother   . Hypertension Brother     Allergies  Allergen  Reactions  . Codeine Nausea And Vomiting  . Erythromycin Nausea And Vomiting  . Hydrocodone-Acetaminophen Nausea And Vomiting  . Hydromorphone Nausea And Vomiting  . Propoxyphene Hcl Nausea And Vomiting  . Statins Other (See Comments)    Leg cramps  . Acetaminophen Other (See Comments)    Does not tolerate well, nausea  . Eggs Or Egg-Derived Products     "pt does not eat"  . Ibuprofen Other (See Comments)    Does not tolerate well  . Shellfish-Derived Products     "pt does not eat"    Current outpatient prescriptions:ALPRAZolam (XANAX) 0.25 MG tablet, Take 1 tablet (0.25 mg total) by mouth 3 (three) times daily as needed for anxiety., Disp: 60 tablet, Rfl: 0;  aspirin 325 MG EC tablet, Take 325 mg by mouth every morning. , Disp: , Rfl: ;  calcium carbonate (TUMS) 500 MG chewable tablet, Chew 1-4 tablets by mouth daily as needed. For acid reflux., Disp: , Rfl:  Cholecalciferol 25000 UNITS CAPS, Take 25,000 Units by mouth 3 (three) times a week. Monday, Wednesday, friday, Disp: , Rfl: ;  ciprofloxacin (CIPRO) 500 MG tablet, Take 500 mg by mouth 2 (two) times  daily., Disp: , Rfl: ;  clopidogrel (PLAVIX) 75 MG tablet, Take 75 mg by mouth daily., Disp: , Rfl: ;  dexlansoprazole (DEXILANT) 60 MG capsule, Take 60 mg by mouth 2 (two) times daily., Disp: , Rfl:  furosemide (LASIX) 40 MG tablet, Take 1 tablet (40 mg total) by mouth daily., Disp: 30 tablet, Rfl: 1;  glyBURIDE (DIABETA) 2.5 MG tablet, , Disp: , Rfl: ;  iron polysaccharides (NIFEREX) 150 MG capsule, Take 150 mg by mouth 4 (four) times daily. , Disp: , Rfl: ;  isosorbide mononitrate (IMDUR) 60 MG 24 hr tablet, Take 1 tablet (60 mg total) by mouth daily., Disp: 30 tablet, Rfl: 1;  KLOR-CON M10 10 MEQ tablet, , Disp: , Rfl:  meperidine (DEMEROL) 50 MG tablet, Take 1 tablet (50 mg total) by mouth every 6 (six) hours as needed. For pain, Disp: 30 tablet, Rfl: 0;  metFORMIN (GLUMETZA) 1000 MG (MOD) 24 hr tablet, Take 1,000 mg by mouth daily with  breakfast., Disp: , Rfl: ;  metoprolol (LOPRESSOR) 50 MG tablet, Take 1 tablet (50 mg total) by mouth 2 (two) times daily., Disp: 60 tablet, Rfl: 1;  metoprolol succinate (TOPROL-XL) 25 MG 24 hr tablet, , Disp: , Rfl:  nitroGLYCERIN (NITROSTAT) 0.4 MG SL tablet, Place 0.4 mg under the tongue every 5 (five) minutes as needed. For chest pain, Disp: , Rfl: ;  omega-3 acid ethyl esters (LOVAZA) 1 G capsule, Take 2 g by mouth 2 (two) times daily. , Disp: , Rfl: ;  pioglitazone-metformin (ACTOPLUS MET) 15-500 MG per tablet, Take 1 tablet by mouth daily. Brand name only-does not work if components are given separately per patient, Disp: , Rfl:  PLAVIX 75 MG tablet, TAKE 1 TABLET BY MOUTH EVERY DAY WITH A MEAL, Disp: 30 tablet, Rfl: 5;  potassium chloride (K-DUR) 10 MEQ tablet, Take 1 tablet (10 mEq total) by mouth 2 (two) times daily., Disp: 60 tablet, Rfl: 1;  Probiotic Product (ACIDOPHILUS PEARLS PO), Take 1 capsule by mouth daily., Disp: , Rfl:  prochlorperazine (COMPAZINE) 10 MG tablet, Take 1 tablet (10 mg total) by mouth every 6 (six) hours as needed. For nausea, Disp: 30 tablet, Rfl: 2;  ramipril (ALTACE) 1.25 MG capsule, Take 1 capsule (1.25 mg total) by mouth 2 (two) times daily., Disp: 60 capsule, Rfl: 1;  sitaGLIPtan (JANUVIA) 100 MG tablet, Take 100 mg by mouth at bedtime. , Disp: , Rfl:  sulfamethoxazole-trimethoprim (BACTRIM DS) 800-160 MG per tablet, Take 1 tablet by mouth 2 (two) times daily., Disp: , Rfl: ;  Vitamin D, Ergocalciferol, (DRISDOL) 50000 UNITS CAPS, Take 1 capsule (50,000 Units total) by mouth every 7 (seven) days., Disp: 30 capsule, Rfl: 1  BP 128/68  Pulse 83  Resp 18  Ht _0  (1.626 m)  Wt 137 lb (62.143 kg)  BMI 23.50 kg/m2  Body mass index is 23.5 kg/(m^2).       On physical exam I do not palpate pedal pulses. She does have audible Doppler flow in her posterior tibial peroneal and dorsalis pedis arteries. This is most prominent in her posterior tibial and peroneal  arteries. She does have a very superficial ulceration over the tip of her left third toe. There is no surrounding erythema and no evidence of invasive infection.   Impression and plan: Had a very long discussion with the patient and her husband present. I do feel that she has adequate flow of healing of this. She osseous microvascular circulation issues. They are questioning the need for hyperbaric  treatment. I do not feel this is needed currently is very superficial ulceration over to. We will obtain repeat duplex of her femoropopliteal bypass to assure there is been no progression of her native popliteal stenosis. Assuming this is stable we would recommend continued observation only. I've instructed her on the use of Neosporin ointment and a dressing over this do not feel she needs any restriction of her other activities. We will see her again in one month for followup of her toe ulcer. She will obtain a noninvasive vascular studies prior to that.

## 2013-08-06 NOTE — Addendum Note (Signed)
Addended by: Adria Dill L on: 08/06/2013 11:38 AM   Modules accepted: Orders

## 2013-08-09 ENCOUNTER — Ambulatory Visit: Payer: Medicare Other | Admitting: Physical Therapy

## 2013-08-12 HISTORY — PX: EYE SURGERY: SHX253

## 2013-08-13 ENCOUNTER — Encounter: Payer: Medicare Other | Admitting: Physical Therapy

## 2013-08-14 ENCOUNTER — Encounter: Payer: Medicare Other | Admitting: Physical Therapy

## 2013-08-16 ENCOUNTER — Encounter (HOSPITAL_COMMUNITY): Payer: Medicare Other

## 2013-08-16 ENCOUNTER — Encounter: Payer: Medicare Other | Admitting: Physical Therapy

## 2013-08-16 ENCOUNTER — Other Ambulatory Visit (HOSPITAL_COMMUNITY): Payer: Medicare Other

## 2013-08-19 HISTORY — PX: EYE SURGERY: SHX253

## 2013-08-20 ENCOUNTER — Encounter: Payer: Medicare Other | Admitting: Physical Therapy

## 2013-08-21 ENCOUNTER — Encounter: Payer: Medicare Other | Admitting: Physical Therapy

## 2013-08-22 ENCOUNTER — Ambulatory Visit (INDEPENDENT_AMBULATORY_CARE_PROVIDER_SITE_OTHER)
Admission: RE | Admit: 2013-08-22 | Discharge: 2013-08-22 | Disposition: A | Discharge status: Home / Self Care | Payer: Medicare Other | Source: Ambulatory Visit | Attending: Vascular Surgery | Admitting: Vascular Surgery

## 2013-08-22 ENCOUNTER — Ambulatory Visit (HOSPITAL_COMMUNITY)
Admission: RE | Admit: 2013-08-22 | Discharge: 2013-08-22 | Disposition: A | Discharge status: Home / Self Care | Payer: Medicare Other | Source: Ambulatory Visit | Attending: Vascular Surgery | Admitting: Vascular Surgery

## 2013-08-22 DIAGNOSIS — I739 Peripheral vascular disease, unspecified: Secondary | ICD-10-CM

## 2013-08-22 DIAGNOSIS — E119 Type 2 diabetes mellitus without complications: Secondary | ICD-10-CM | POA: Insufficient documentation

## 2013-08-22 DIAGNOSIS — I10 Essential (primary) hypertension: Secondary | ICD-10-CM | POA: Insufficient documentation

## 2013-08-22 DIAGNOSIS — Z48812 Encounter for surgical aftercare following surgery on the circulatory system: Secondary | ICD-10-CM

## 2013-08-22 DIAGNOSIS — E785 Hyperlipidemia, unspecified: Secondary | ICD-10-CM | POA: Insufficient documentation

## 2013-08-22 DIAGNOSIS — Z9889 Other specified postprocedural states: Secondary | ICD-10-CM | POA: Insufficient documentation

## 2013-08-22 DIAGNOSIS — Z87891 Personal history of nicotine dependence: Secondary | ICD-10-CM | POA: Insufficient documentation

## 2013-08-23 ENCOUNTER — Ambulatory Visit: Payer: Medicare Other | Admitting: Physical Therapy

## 2013-08-26 ENCOUNTER — Ambulatory Visit: Payer: Medicare Other | Admitting: Physical Therapy

## 2013-08-27 ENCOUNTER — Encounter: Payer: Medicare Other | Admitting: Physical Therapy

## 2013-08-28 ENCOUNTER — Ambulatory Visit: Payer: Medicare Other | Admitting: Physical Therapy

## 2013-08-30 ENCOUNTER — Encounter: Payer: Medicare Other | Admitting: Physical Therapy

## 2013-09-02 ENCOUNTER — Ambulatory Visit: Payer: Medicare Other | Attending: Vascular Surgery | Admitting: Physical Therapy

## 2013-09-02 ENCOUNTER — Encounter: Payer: Self-pay | Admitting: Vascular Surgery

## 2013-09-02 DIAGNOSIS — R269 Unspecified abnormalities of gait and mobility: Secondary | ICD-10-CM | POA: Insufficient documentation

## 2013-09-02 DIAGNOSIS — R5381 Other malaise: Secondary | ICD-10-CM | POA: Insufficient documentation

## 2013-09-02 DIAGNOSIS — IMO0001 Reserved for inherently not codable concepts without codable children: Secondary | ICD-10-CM | POA: Insufficient documentation

## 2013-09-02 DIAGNOSIS — M6281 Muscle weakness (generalized): Secondary | ICD-10-CM | POA: Insufficient documentation

## 2013-09-02 DIAGNOSIS — M2560 Stiffness of unspecified joint, not elsewhere classified: Secondary | ICD-10-CM | POA: Insufficient documentation

## 2013-09-02 DIAGNOSIS — S88119A Complete traumatic amputation at level between knee and ankle, unspecified lower leg, initial encounter: Secondary | ICD-10-CM | POA: Insufficient documentation

## 2013-09-03 ENCOUNTER — Encounter: Payer: Self-pay | Admitting: Vascular Surgery

## 2013-09-03 ENCOUNTER — Ambulatory Visit (INDEPENDENT_AMBULATORY_CARE_PROVIDER_SITE_OTHER): Payer: Medicare Other | Admitting: Vascular Surgery

## 2013-09-03 VITALS — BP 112/63 | HR 77 | Resp 16 | Ht 64.0 in | Wt 137.0 lb

## 2013-09-03 DIAGNOSIS — Z48812 Encounter for surgical aftercare following surgery on the circulatory system: Secondary | ICD-10-CM

## 2013-09-03 DIAGNOSIS — I739 Peripheral vascular disease, unspecified: Secondary | ICD-10-CM

## 2013-09-03 DIAGNOSIS — L98499 Non-pressure chronic ulcer of skin of other sites with unspecified severity: Principal | ICD-10-CM

## 2013-09-03 NOTE — Progress Notes (Signed)
Here today for continued evaluation of her left great toe ulceration. She does have a prior aortobifemoral bypass and left femoral to above-knee popliteal bypass. 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  Her to a good day she has approximately 4 mm superficial ulcer over the tip of her toe with no surrounding erythema.  She did undergo ankle arm index at to my last visit with her one month ago. I believe this and discuss this with the patient and her husband present. This does show ankle arm index of 0.69 on 08/22/2013 as compared to 0.74 on 06/11/2013. She does have some increased velocities at the level of the popliteal artery which were up from 380 cm a second 410 cm per sec.  Impression and plan patent left femoral-popliteal bypass with disease in the native artery below this. I did a very long discussion with the patient and her husband. I do not feel that this is putting her in any risk for limb loss or slow healing of her toe ulceration. I did extremely this does today she put her at risk for graft occlusion. I would not recommend aggressive treatment this time with her multiple prior revisions. I feel we should watch this closely in the office. I did explain symptoms for graft occlusion and she knows to present immediately should this occur or if she has any worsening in her toe ulcer. We will see her again in 3 months for repeat office visit and vascular lab study

## 2013-09-03 NOTE — Addendum Note (Signed)
Addended by: Sharee Pimple on: 09/03/2013 03:12 PM   Modules accepted: Orders

## 2013-09-04 ENCOUNTER — Ambulatory Visit: Payer: Medicare Other | Admitting: Physical Therapy

## 2013-09-09 ENCOUNTER — Ambulatory Visit: Payer: Medicare Other | Admitting: Physical Therapy

## 2013-09-11 ENCOUNTER — Ambulatory Visit: Payer: Medicare Other | Admitting: Physical Therapy

## 2013-09-16 ENCOUNTER — Ambulatory Visit: Payer: Medicare Other | Admitting: Physical Therapy

## 2013-09-18 ENCOUNTER — Ambulatory Visit: Payer: Medicare Other | Admitting: Physical Therapy

## 2013-09-23 ENCOUNTER — Ambulatory Visit: Payer: Medicare Other | Admitting: Physical Therapy

## 2013-09-25 ENCOUNTER — Ambulatory Visit: Payer: Medicare Other | Admitting: Physical Therapy

## 2013-09-30 ENCOUNTER — Ambulatory Visit: Payer: Medicare Other | Admitting: Physical Therapy

## 2013-10-02 ENCOUNTER — Ambulatory Visit: Payer: Medicare Other | Attending: Vascular Surgery | Admitting: Physical Therapy

## 2013-10-02 DIAGNOSIS — R269 Unspecified abnormalities of gait and mobility: Secondary | ICD-10-CM | POA: Insufficient documentation

## 2013-10-02 DIAGNOSIS — M6281 Muscle weakness (generalized): Secondary | ICD-10-CM | POA: Insufficient documentation

## 2013-10-02 DIAGNOSIS — R5381 Other malaise: Secondary | ICD-10-CM | POA: Insufficient documentation

## 2013-10-02 DIAGNOSIS — S88119A Complete traumatic amputation at level between knee and ankle, unspecified lower leg, initial encounter: Secondary | ICD-10-CM | POA: Insufficient documentation

## 2013-10-02 DIAGNOSIS — IMO0001 Reserved for inherently not codable concepts without codable children: Secondary | ICD-10-CM | POA: Insufficient documentation

## 2013-10-02 DIAGNOSIS — M2560 Stiffness of unspecified joint, not elsewhere classified: Secondary | ICD-10-CM | POA: Insufficient documentation

## 2013-10-07 ENCOUNTER — Ambulatory Visit: Payer: Medicare Other | Admitting: Physical Therapy

## 2013-10-09 ENCOUNTER — Ambulatory Visit: Payer: Medicare Other | Admitting: Physical Therapy

## 2013-10-14 ENCOUNTER — Ambulatory Visit: Payer: Medicare Other | Admitting: Physical Therapy

## 2013-10-15 ENCOUNTER — Encounter (HOSPITAL_BASED_OUTPATIENT_CLINIC_OR_DEPARTMENT_OTHER): Payer: Medicare Other | Attending: General Surgery

## 2013-10-15 DIAGNOSIS — Z87891 Personal history of nicotine dependence: Secondary | ICD-10-CM | POA: Insufficient documentation

## 2013-10-15 DIAGNOSIS — I7300 Raynaud's syndrome without gangrene: Secondary | ICD-10-CM | POA: Insufficient documentation

## 2013-10-15 DIAGNOSIS — E1169 Type 2 diabetes mellitus with other specified complication: Secondary | ICD-10-CM | POA: Insufficient documentation

## 2013-10-15 DIAGNOSIS — S88119A Complete traumatic amputation at level between knee and ankle, unspecified lower leg, initial encounter: Secondary | ICD-10-CM | POA: Insufficient documentation

## 2013-10-15 DIAGNOSIS — I10 Essential (primary) hypertension: Secondary | ICD-10-CM | POA: Insufficient documentation

## 2013-10-15 DIAGNOSIS — L97509 Non-pressure chronic ulcer of other part of unspecified foot with unspecified severity: Secondary | ICD-10-CM | POA: Insufficient documentation

## 2013-10-15 DIAGNOSIS — I2510 Atherosclerotic heart disease of native coronary artery without angina pectoris: Secondary | ICD-10-CM | POA: Insufficient documentation

## 2013-10-15 DIAGNOSIS — Z90710 Acquired absence of both cervix and uterus: Secondary | ICD-10-CM | POA: Insufficient documentation

## 2013-10-15 DIAGNOSIS — I252 Old myocardial infarction: Secondary | ICD-10-CM | POA: Insufficient documentation

## 2013-10-16 ENCOUNTER — Ambulatory Visit: Payer: Medicare Other | Admitting: Physical Therapy

## 2013-10-16 NOTE — H&P (Signed)
[name], [name]             ACCOUNT NO.:  [id number]  MEDICAL RECORD NO.:  [id number]  LOCATION:  FOOT                         FACILITY:  [facility]  PHYSICIAN:  Joanne Gavel, M.D.        DATE OF BIRTH:  09-06-1945  DATE OF ADMISSION:  10/15/2013 DATE OF DISCHARGE:                             HISTORY & PHYSICAL   CHIEF COMPLAINT:  Wound on left great toe.  HISTORY OF PRESENT ILLNESS:  This is a 68 year old female, diabetic, with a long history of multiple vascular procedures.  She was a cigarette smoker until the very recent past.  Several months ago, she developed a very superficial wound to the tip of the left great toe. She is status post right below-knee amputation.  This has been treated with various ointments.  She has seen Dr. Arbie Cookey who does not think she is a candidate for vascular surgery.  The ABI measured in his office was below 0.8.  PAST MEDICAL HISTORY: 1. History of chest pain. 2. Coronary artery disease. 3. Hypertension. 4. Heart attack. 5. Raynaud phenomenon. 6. She is status post cardiac stent, multiple femoral stents, and     abdominal stents.  PAST SURGICAL HISTORY:  Bilateral cataracts, fem-pop bypass x3, multiple angiograms, multiple cardiac stents, carotid endarterectomy, breast lumpectomy, tonsillectomy, adenoidectomy, hysterectomy.  ALLERGIES:  CODEINE, HYDROMORPHONE, ERYTHROMYCIN, SHELL FISH, ACETAMINOPHEN, and VICODIN.  REVIEW OF SYSTEMS:  As above.  PHYSICAL EXAMINATION:  VITAL SIGNS:  Temperature 98.1, pulse 89, respirations 16, blood pressure 148/78. GENERAL APPEARANCE:  Well developed, well nourished, no distress. CHEST:  Clear. HEART:  Regular rhythm. EXTREMITIES:  Examination of the lower extremities reveals peripheral pulses not easily palpable.  At the tip of the left great toe, there is a 0.5 x 0.5 very superficial wound like an abrasion.  The toe and distal dorsum of the foot near the first toe are quite red and  tender.  IMPRESSION:  Diabetic foot ulcer Wagner 2 right great toe, possible cellulitis.  PLAN OF TREATMENT:  We started with Santyl and Hydrogel, and we have given her a prescription for Keflex 500 b.i.d.  We will see her in 7 days.     Joanne Gavel, M.D.     RA/MEDQ  D:  10/15/2013  T:  10/16/2013  Job:  [id number]  cc:   Larina Earthly, M.D.

## 2013-10-17 ENCOUNTER — Other Ambulatory Visit (HOSPITAL_BASED_OUTPATIENT_CLINIC_OR_DEPARTMENT_OTHER): Payer: Self-pay | Admitting: General Surgery

## 2013-10-17 ENCOUNTER — Ambulatory Visit (HOSPITAL_COMMUNITY)
Admission: RE | Admit: 2013-10-17 | Discharge: 2013-10-17 | Disposition: A | Discharge status: Home / Self Care | Payer: Medicare Other | Source: Ambulatory Visit | Attending: General Surgery | Admitting: General Surgery

## 2013-10-17 DIAGNOSIS — L97509 Non-pressure chronic ulcer of other part of unspecified foot with unspecified severity: Secondary | ICD-10-CM | POA: Insufficient documentation

## 2013-10-17 DIAGNOSIS — M869 Osteomyelitis, unspecified: Secondary | ICD-10-CM

## 2013-10-17 DIAGNOSIS — E1169 Type 2 diabetes mellitus with other specified complication: Secondary | ICD-10-CM | POA: Insufficient documentation

## 2013-10-21 ENCOUNTER — Ambulatory Visit: Payer: Medicare Other | Admitting: Physical Therapy

## 2013-10-23 ENCOUNTER — Encounter: Payer: Medicare Other | Admitting: Physical Therapy

## 2013-10-25 ENCOUNTER — Other Ambulatory Visit: Payer: Self-pay | Admitting: Cardiovascular Disease

## 2013-10-28 ENCOUNTER — Ambulatory Visit: Payer: Medicare Other | Admitting: Physical Therapy

## 2013-10-30 ENCOUNTER — Ambulatory Visit: Payer: Medicare Other | Admitting: Physical Therapy

## 2013-11-05 ENCOUNTER — Encounter (HOSPITAL_BASED_OUTPATIENT_CLINIC_OR_DEPARTMENT_OTHER): Payer: Medicare Other | Attending: General Surgery

## 2013-11-05 DIAGNOSIS — E1159 Type 2 diabetes mellitus with other circulatory complications: Secondary | ICD-10-CM | POA: Insufficient documentation

## 2013-11-05 DIAGNOSIS — L97509 Non-pressure chronic ulcer of other part of unspecified foot with unspecified severity: Secondary | ICD-10-CM | POA: Insufficient documentation

## 2013-11-06 NOTE — Progress Notes (Signed)
[facility]  NAME:  HAVILAH, KNIPPEL             ACCOUNT NO.:  [id number]  MEDICAL RECORD NO.:  [id number]      DATE OF BIRTH:  06-07-1946  PHYSICIAN:  Joanne Gavel, M.D.         VISIT DATE:  11/05/2013                                  OFFICE VISIT   To whom it may concern;  Ms. [name] is a  68 year old diabetic female with severe peripheral vascular disease.  She has had multiple vascular procedures and her ABI is now 6.9.  The vascular surgeons say she is no longer, reconstructible . She is status post right BK amputation.  She has a 0.9 x 0.6 wound at the tip of her left great toe.  The toe is red and tender.  She has constant pain, which is somewhat relieved by hanging the foot down.  TCOMs revealed baseline at the toe of 30; with the leg elevated, the partial pressure was 4; with oxygen delivery, the TCOMs went up to 61.  This patient requires hyperbaric oxygen for diabetic ulcer with critical limb ischemia as proven by the presence of rest pain, skin changes, and TCOMs.  It appears that without successful HBO treatment, the patient will have amputation fairly soon.     Joanne Gavel, M.D.     RA/MEDQ  D:  11/05/2013  T:  11/06/2013  Job:  [id number]

## 2013-11-07 ENCOUNTER — Other Ambulatory Visit (HOSPITAL_BASED_OUTPATIENT_CLINIC_OR_DEPARTMENT_OTHER): Payer: Self-pay | Admitting: General Surgery

## 2013-11-07 ENCOUNTER — Ambulatory Visit (HOSPITAL_COMMUNITY)
Admission: RE | Admit: 2013-11-07 | Discharge: 2013-11-07 | Disposition: A | Discharge status: Home / Self Care | Payer: Medicare Other | Source: Ambulatory Visit | Attending: General Surgery | Admitting: General Surgery

## 2013-11-07 DIAGNOSIS — Z9289 Personal history of other medical treatment: Secondary | ICD-10-CM

## 2013-11-07 DIAGNOSIS — Z01818 Encounter for other preprocedural examination: Secondary | ICD-10-CM | POA: Insufficient documentation

## 2013-11-27 ENCOUNTER — Telehealth: Payer: Self-pay | Admitting: *Deleted

## 2013-11-27 NOTE — Telephone Encounter (Signed)
PA to [facility] for PLAVIX

## 2013-12-03 ENCOUNTER — Other Ambulatory Visit (HOSPITAL_COMMUNITY): Payer: Medicare Other

## 2013-12-03 ENCOUNTER — Encounter (HOSPITAL_BASED_OUTPATIENT_CLINIC_OR_DEPARTMENT_OTHER): Payer: Medicare Other | Attending: General Surgery

## 2013-12-03 ENCOUNTER — Ambulatory Visit: Payer: Medicare Other | Admitting: Vascular Surgery

## 2013-12-03 ENCOUNTER — Encounter (HOSPITAL_COMMUNITY): Payer: Medicare Other

## 2013-12-03 DIAGNOSIS — E1169 Type 2 diabetes mellitus with other specified complication: Secondary | ICD-10-CM | POA: Insufficient documentation

## 2013-12-03 DIAGNOSIS — L97509 Non-pressure chronic ulcer of other part of unspecified foot with unspecified severity: Secondary | ICD-10-CM | POA: Insufficient documentation

## 2013-12-03 NOTE — Telephone Encounter (Signed)
[employer] approved PLAVIX through 11/27/2014 Pharmacy notified

## 2013-12-04 ENCOUNTER — Encounter: Payer: Self-pay | Admitting: Family

## 2013-12-05 ENCOUNTER — Ambulatory Visit (HOSPITAL_COMMUNITY)
Admission: RE | Admit: 2013-12-05 | Discharge: 2013-12-05 | Disposition: A | Discharge status: Home / Self Care | Payer: Medicare Other | Source: Ambulatory Visit | Attending: Family | Admitting: Family

## 2013-12-05 ENCOUNTER — Encounter: Payer: Self-pay | Admitting: Family

## 2013-12-05 ENCOUNTER — Ambulatory Visit (INDEPENDENT_AMBULATORY_CARE_PROVIDER_SITE_OTHER)
Admission: RE | Admit: 2013-12-05 | Discharge: 2013-12-05 | Disposition: A | Discharge status: Home / Self Care | Payer: Medicare Other | Source: Ambulatory Visit | Attending: Vascular Surgery | Admitting: Vascular Surgery

## 2013-12-05 ENCOUNTER — Ambulatory Visit (INDEPENDENT_AMBULATORY_CARE_PROVIDER_SITE_OTHER): Payer: Medicare Other | Admitting: Family

## 2013-12-05 VITALS — BP 127/65 | HR 73 | Temp 97.1°F | Resp 16 | Ht 64.0 in | Wt 142.0 lb

## 2013-12-05 DIAGNOSIS — L98499 Non-pressure chronic ulcer of skin of other sites with unspecified severity: Principal | ICD-10-CM | POA: Insufficient documentation

## 2013-12-05 DIAGNOSIS — I739 Peripheral vascular disease, unspecified: Secondary | ICD-10-CM

## 2013-12-05 DIAGNOSIS — T8189XA Other complications of procedures, not elsewhere classified, initial encounter: Secondary | ICD-10-CM

## 2013-12-05 DIAGNOSIS — Z48812 Encounter for surgical aftercare following surgery on the circulatory system: Secondary | ICD-10-CM

## 2013-12-05 DIAGNOSIS — I6529 Occlusion and stenosis of unspecified carotid artery: Secondary | ICD-10-CM

## 2013-12-05 DIAGNOSIS — M79609 Pain in unspecified limb: Secondary | ICD-10-CM

## 2013-12-05 DIAGNOSIS — R0989 Other specified symptoms and signs involving the circulatory and respiratory systems: Secondary | ICD-10-CM

## 2013-12-05 NOTE — Progress Notes (Signed)
[facility] [place] HISTORY AND PHYSICAL -PAD  History of Present Illness [name] is a 68 y.o. female patient of Dr. Donnetta Hutching who is s/p aorto-bifem BPG 2013, left fem-pop BPG 2009, and right BKA 2013. She is here for scheduled follow up and C/O Left popliteal pain, duration 3-4 days, left great toe, non-healing wound, duration- 3 mo. Tx. by Wound Center: [facility]. Has been taking Keflex for 3-4 weeks. Using Santyl on the left great toe ulcer. Walks some with her walker. Pt denies any stroke or TIA history. Pt is wearing right BKA prosthesis, she is sitting in a wheelchair.  Pt Diabetic: Yes, last A1C was 7.5 per pt Pt smoker: former smoker, quit in 2003  Pt meds include: Statin :Yes, takes 10 mg Crestor once/week due to myalgias ASA: Yes Other anticoagulants/antiplatelets: Plavix  Past Medical History  Diagnosis Date  . Hypertension     Unspecified  . Hyperlipidemia     Mixed  . Tobacco abuse     Remote  . Loose, teeth     has loose bridge and two loose teeth holding it  . PONV (postoperative nausea and vomiting)   . Type II diabetes mellitus   . GERD (gastroesophageal reflux disease)   . Anxiety   . Peripheral vascular disease     unspecified, a. s/p L CEA b. s/p B fem-pop bypass  . Raynaud disease   . Stones in the urinary tract   . UTI (urinary tract infection)   . Anemia     hx  . Coronary artery disease     a. s/p NSTEMI 02/2009 - PCI LCX with Xience DES. Otherwise branch vessel and Dist. RCA dzs. NL EF.   Marland Kitchen Myocardial infarction   . CHF (congestive heart failure)   . Kidney stones   . UTI (lower urinary tract infection)   . Constipation   . Hernia, umbilical   . History of blood transfusion   . Fall from slipping on wet surface Jan. 7, 2014    Right stump bleeding from fall    Social History History  Substance Use Topics  . Smoking status: Former Smoker -- 2.00 packs/day for 39 years    Types: Cigarettes    Quit date: 07/05/2001   . Smokeless tobacco: Never Used  . Alcohol Use: 7.0 oz/week    14 drink(s) per week    Family History Family History  Problem Relation Age of Onset  . Heart attack Mother   . Heart attack Father   . Hypertension Brother   . Diabetes Brother   . Hypertension Brother     Past Surgical History  Procedure Laterality Date  . Vesicovaginal fistula closure w/ tah    . Colonoscopy  11/2010  . Upper gastrointestinal endoscopy  11/2010  . Femoral artery - popliteal artery bypass graft      left  . Abdominal hysterectomy      complete  . Femoral-popliteal bypass graft  08/22/2011    Procedure: BYPASS GRAFT FEMORAL-POPLITEAL ARTERY;  Surgeon: Curt Jews, MD;  Location: [facility];  Service: Vascular;  Laterality: Right;  Thrombectomy and Revision using 93m x 10cm stretch goretex graft  . Intraoperative arteriogram  08/22/2011    Procedure: INTRA OPERATIVE ARTERIOGRAM;  Surgeon: TCurt Jews MD;  [facility];  Service: Vascular;  Laterality: Right;  to lower leg  . Multiple tooth extractions  08-29-2011    5 teeth extracted   . Pci  01/17/12    RLE  .  Breast lumpectomy      right  . Tonsillectomy and adenoidectomy    . Coronary angioplasty with stent placement    . Carotid endarterectomy  ~ 2005    left  . Aorta - bilateral femoral artery bypass graft  03/26/2012    Procedure: AORTA BIFEMORAL BYPASS GRAFT;  Surgeon: Rosetta Posner, MD;  Location: [facility] OR;  Service: Vascular;  Laterality: N/A;  Aortic-bifemoral bypass using 14x101m Hemashield graft .   . Umbilical hernia repair  03/26/2012    Procedure: HERNIA REPAIR UMBILICAL ADULT;  Surgeon: TRosetta Posner MD;  [facility];  Service: Vascular;  Laterality: N/A;  Removal of Umbilical hernia sac  . Femoral artery exploration  03/26/2012    Procedure: FEMORAL ARTERY EXPLORATION;  Surgeon: TRosetta Posner MD;  [facility];  Service: Vascular;  Laterality: Right;  with Revision of Popliteal-Peroneal bypass graft using 648mx 10cm thin wall goretex  graft  . Amputation  04/23/2012    Procedure: AMPUTATION BELOW KNEE;  Surgeon: ToRosetta PosnerMD;  [facility] Service: [facility];  Laterality: Right;  . Eye surgery Right Feb. 9, 2015    Cataract  . Eye surgery Left Feb. 16, 2015    Cataract    Allergies  Allergen Reactions  . Codeine Nausea And Vomiting  . Erythromycin Nausea And Vomiting  . Hydrocodone-Acetaminophen Nausea And Vomiting  . Hydromorphone Nausea And Vomiting  . Propoxyphene Hcl Nausea And Vomiting  . Statins Other (See Comments)    Leg cramps  . Acetaminophen Other (See Comments)    Does not tolerate well, nausea  . Eggs Or Egg-Derived Products     "pt does not eat"  . Ibuprofen Other (See Comments)    Does not tolerate well  . Shellfish-Derived Products     "pt does not eat"    Current Outpatient Prescriptions  Medication Sig Dispense Refill  . ALPRAZolam (XANAX) 0.25 MG tablet Take 1 tablet (0.25 mg total) by mouth 3 (three) times daily as needed for anxiety.  60 tablet  0  . aspirin 325 MG EC tablet Take 325 mg by mouth every morning.       . Marland KitchenESIVANCE 0.6 % SUSP       . calcium carbonate (TUMS) 500 MG chewable tablet Chew 1-4 tablets by mouth daily as needed. For acid reflux.      . Cholecalciferol 25000 UNITS CAPS Take 25,000 Units by mouth 3 (three) times a week. Monday, Wednesday, friday      . clopidogrel (PLAVIX) 75 MG tablet Take 75 mg by mouth daily.      . Marland Kitchenexlansoprazole (DEXILANT) 60 MG capsule Take 60 mg by mouth 2 (two) times daily.      . furosemide (LASIX) 40 MG tablet Take 1 tablet (40 mg total) by mouth daily.  30 tablet  1  . glyBURIDE (DIABETA) 2.5 MG tablet       . iron polysaccharides (NIFEREX) 150 MG capsule Take 150 mg by mouth 3 (three) times daily.       . isosorbide mononitrate (IMDUR) 60 MG 24 hr tablet Take 1 tablet (60 mg total) by mouth daily.  30 tablet  1  . KLOR-CON M10 10 MEQ tablet       . metoprolol (LOPRESSOR) 50 MG tablet Take 1 tablet (50 mg total) by mouth 2  (two) times daily.  60 tablet  1  . omega-3 acid ethyl esters (LOVAZA) 1 G capsule Take 2 g by mouth  2 (two) times daily.       . pioglitazone-metformin (ACTOPLUS MET) 15-500 MG per tablet Take 1 tablet by mouth daily. Brand name only-does not work if components are given separately per patient      . Probiotic Product (ACIDOPHILUS PEARLS PO) Take 1 capsule by mouth daily.      . prochlorperazine (COMPAZINE) 10 MG tablet Take 1 tablet (10 mg total) by mouth every 6 (six) hours as needed. For nausea  30 tablet  2  . ramipril (ALTACE) 1.25 MG capsule Take 1 capsule (1.25 mg total) by mouth 2 (two) times daily.  60 capsule  1  . SANTYL ointment       . sitaGLIPtan (JANUVIA) 100 MG tablet Take 100 mg by mouth at bedtime.       . ciprofloxacin (CIPRO) 500 MG tablet Take 500 mg by mouth 2 (two) times daily.      . DUREZOL 0.05 % EMUL Apply topically 2 (two) times daily.       . meperidine (DEMEROL) 50 MG tablet Take 1 tablet (50 mg total) by mouth every 6 (six) hours as needed. For pain  30 tablet  0  . metFORMIN (GLUMETZA) 1000 MG (MOD) 24 hr tablet Take 1,000 mg by mouth daily with breakfast.      . metoprolol succinate (TOPROL-XL) 25 MG 24 hr tablet       . nitroGLYCERIN (NITROSTAT) 0.4 MG SL tablet Place 0.4 mg under the tongue every 5 (five) minutes as needed. For chest pain      . PLAVIX 75 MG tablet TAKE 1 TABLET BY MOUTH ONCE DAILY WITH A MEAL  30 tablet  5  . potassium chloride (K-DUR) 10 MEQ tablet Take 1 tablet (10 mEq total) by mouth 2 (two) times daily.  60 tablet  1  . sulfamethoxazole-trimethoprim (BACTRIM DS) 800-160 MG per tablet Take 1 tablet by mouth 2 (two) times daily.      . Vitamin D, Ergocalciferol, (DRISDOL) 50000 UNITS CAPS Take 1 capsule (50,000 Units total) by mouth every 7 (seven) days.  30 capsule  1   No current facility-administered medications for this visit.    ROS: See HPI for pertinent positives and negatives.   Physical Examination  Filed Vitals:   12/05/13  1159  BP: 127/65  Pulse: 73  Temp: 97.1 F (36.2 C)  Resp: 16  Body mass index is 24.36 kg/(m^2).  General: A&O x 3, WDWN. Gait: in w/c Eyes: Pupils equal Pulmonary: CTAB, without wheezes , rales or rhonchi. Cardiac: regular Rythm , without detected murmur.         Carotid Bruits Left Right   Positive Negative  Aorta is not palpable. Radial pulses: absent left radial and ulnar , 1+ left brachial; absent right radial and ulnar, 2+ right brachial                         VASCULAR EXAM: Extremities without ischemic changes  without Gangrene; with non draining ulcer left great toe, red left great toe.  LE Pulses LEFT RIGHT       FEMORAL  3+ palpable  2+ palpable        POPLITEAL  not palpable   not palpable       POSTERIOR TIBIAL  not palpable   BKA        DORSALIS PEDIS      ANTERIOR TIBIAL Not palpable  BKA    Abdomen: soft, NT, no masses. Skin: no rashes, no ulcers noted. Musculoskeletal: no muscle wasting or atrophy.  Neurologic: A&O X 3; Appropriate Affect ; SENSATION: normal; MOTOR FUNCTION:  moving all extremities equally, motor strength 5/5 throughout. Speech is fluent/normal. CN 2-12 grossly intact.    Non-Invasive Vascular Imaging: DATE: 12/05/2013 LOWER EXTREMITY ARTERIAL DUPLEX EVALUATION    INDICATION: Follow up bypass graft     PREVIOUS INTERVENTION(S): Left femoral to above knee popliteal artery bypass graft on 07/09/07; Aortobifemoral bypass graft on 03/26/12; Right below knee amputation in 2013    DUPLEX EXAM:     RIGHT  LEFT   Peak Systolic Velocity (cm/s) Ratio (if abnormal) Waveform  Peak Systolic Velocity (cm/s) Ratio (if abnormal) Waveform     Inflow Artery   M     Proximal Anastomosis   M     Proximal Graft   M     Mid Graft   M      Distal Graft   M     Distal Anastomosis   M     Outflow Artery   M  BKA Today's ABI / TBI 0.69   Previous  ABI / TBI (08/22/13 ) 0.69    Waveform:    M - Monophasic       B - Biphasic       T - Triphasic  If Ankle Brachial Index (ABI) or Toe Brachial Index (TBI) performed, please see complete report     ADDITIONAL FINDINGS:   No internal vessel narrowing noted within the bypass graft or anastomosis.   Hemodynamically significant plaque noted in the left proximal popliteal artery.   Monophasic flow noted in the patent left limb of aortobifemoral bypass graft.    IMPRESSION: 1. Patent left leg bypass graft with no evidence of stenosis noted. 2. Greater than 50% stenosis of the left proximal popliteal artery near the distal anastomosis.    Compared to the previous exam:  No significant change when compared to the exam on 08/22/13.    ASSESSMENT: Jnaya Butrick is a 68 y.o. female who is s/p aorto-bifem BPG 2013, left fem-pop BPG 2009, and right BKA 2013. She is here for scheduled follow up and C/O Left popliteal pain, duration 3-4 days, left great toe, non-healing wound, duration- 3 mo. Tx. by Wound Center: [facility]. Patent left leg bypass graft with no evidence of stenosis noted. Greater than 50% stenosis of the left proximal popliteal artery near the distal anastomosis. No significant change when compared to the exam on 08/22/13. She has a left carotid bruit. She had a left CEA in 2005, last carotid Duplex result on file was 2013 which shows right ICA with 40-59% stenosis and left ICA as patent s/p CEA. Will add carotid Duplex on return in 3 months.  PLAN:  Continue with wound care center treatment. Chair exercises and walking daily as discussed.  Elevate left leg to heart level when not exercising or walking. I discussed in depth with the patient the nature of atherosclerosis, and emphasized the importance of maximal medical management including strict control of blood pressure, blood  glucose, and lipid levels, obtaining regular exercise, and continued cessation of smoking.  The patient is aware  that without maximal medical management the underlying atherosclerotic disease process will progress, limiting the benefit of any interventions.  Based on the patient's vascular studies and examination, pt will return to clinic in 3 months for left ABI and arterial LE Duplex, carotid Duplex.  The patient was given information about PAD including signs, symptoms, treatment, what symptoms should prompt the patient to seek immediate medical care, and risk reduction measures to take.  Clemon Chambers, RN, MSN, FNP-C [facility] Phone: [phone]  Clinic MD: Oneida Alar  12/05/2013 12:16 PM

## 2013-12-05 NOTE — Patient Instructions (Signed)
Peripheral Vascular Disease Peripheral Vascular Disease (PVD), also called Peripheral Arterial Disease (PAD), is a circulation problem caused by cholesterol (atherosclerotic plaque) deposits in the arteries. PVD commonly occurs in the lower extremities (legs) but it can occur in other areas of the body, such as your arms. The cholesterol buildup in the arteries reduces blood flow which can cause pain and other serious problems. The presence of PVD can place a person at risk for Coronary Artery Disease (CAD).  CAUSES  Causes of PVD can be many. It is usually associated with more than one risk factor such as:   High Cholesterol.  Smoking.  Diabetes.  Lack of exercise or inactivity.  High blood pressure (hypertension).  Obesity.  Family history. SYMPTOMS   When the lower extremities are affected, patients with PVD may experience:  Leg pain with exertion or physical activity. This is called INTERMITTENT CLAUDICATION. This may present as cramping or numbness with physical activity. The location of the pain is associated with the level of blockage. For example, blockage at the abdominal level (distal abdominal aorta) may result in buttock or hip pain. Lower leg arterial blockage may result in calf pain.  As PVD becomes more severe, pain can develop with less physical activity.  In people with severe PVD, leg pain may occur at rest.  Other PVD signs and symptoms:  Leg numbness or weakness.  Coldness in the affected leg or foot, especially when compared to the other leg.  A change in leg color.  Patients with significant PVD are more prone to ulcers or sores on toes, feet or legs. These may take longer to heal or may reoccur. The ulcers or sores can become infected.  If signs and symptoms of PVD are ignored, gangrene may occur. This can result in the loss of toes or loss of an entire limb.  Not all leg pain is related to PVD. Other medical conditions can cause leg pain such  as:  Blood clots (embolism) or Deep Vein Thrombosis.  Inflammation of the blood vessels (vasculitis).  Spinal stenosis. DIAGNOSIS  Diagnosis of PVD can involve several different types of tests. These can include:  Pulse Volume Recording Method (PVR). This test is simple, painless and does not involve the use of X-rays. PVR involves measuring and comparing the blood pressure in the arms and legs. An ABI (Ankle-Brachial Index) is calculated. The normal ratio of blood pressures is 1. As this number becomes smaller, it indicates more severe disease.  < 0.95  indicates significant narrowing in one or more leg vessels.  <0.8 there will usually be pain in the foot, leg or buttock with exercise.  <0.4 will usually have pain in the legs at rest.  <0.25  usually indicates limb threatening PVD.  Doppler detection of pulses in the legs. This test is painless and checks to see if you have a pulses in your legs/feet.  A dye or contrast material (a substance that highlights the blood vessels so they show up on x-ray) may be given to help your caregiver better see the arteries for the following tests. The dye is eliminated from your body by the kidney's. Your caregiver may order blood work to check your kidney function and other laboratory values before the following tests are performed:  Magnetic Resonance Angiography (MRA). An MRA is a picture study of the blood vessels and arteries. The MRA machine uses a large magnet to produce images of the blood vessels.  Computed Tomography Angiography (CTA). A CTA is a   specialized x-ray that looks at how the blood flows in your blood vessels. An IV may be inserted into your arm so contrast dye can be injected.  Angiogram. Is a procedure that uses x-rays to look at your blood vessels. This procedure is minimally invasive, meaning a small incision (cut) is made in your groin. A small tube (catheter) is then inserted into the artery of your groin. The catheter is  guided to the blood vessel or artery your caregiver wants to examine. Contrast dye is injected into the catheter. X-rays are then taken of the blood vessel or artery. After the images are obtained, the catheter is taken out. TREATMENT  Treatment of PVD involves many interventions which may include:  Lifestyle changes:  Quitting smoking.  Exercise.  Following a low fat, low cholesterol diet.  Control of diabetes.  Foot care is very important to the PVD patient. Good foot care can help prevent infection.  Medication:  Cholesterol-lowering medicine.  Blood pressure medicine.  Anti-platelet drugs.  Certain medicines may reduce symptoms of Intermittent Claudication.  Interventional/Surgical options:  Angioplasty. An Angioplasty is a procedure that inflates a balloon in the blocked artery. This opens the blocked artery to improve blood flow.  Stent Implant. A wire mesh tube (stent) is placed in the artery. The stent expands and stays in place, allowing the artery to remain open.  Peripheral Bypass Surgery. This is a surgical procedure that reroutes the blood around a blocked artery to help improve blood flow. This type of procedure may be performed if Angioplasty or stent implants are not an option. SEEK IMMEDIATE MEDICAL CARE IF:   You develop pain or numbness in your arms or legs.  Your arm or leg turns cold, becomes blue in color.  You develop redness, warmth, swelling and pain in your arms or legs. MAKE SURE YOU:   Understand these instructions.  Will watch your condition.  Will get help right away if you are not doing well or get worse. Document Released: 07/28/2004 Document Revised: 09/12/2011 Document Reviewed: 06/24/2008 [employer] Patient Information 2014 [employer].  

## 2013-12-09 ENCOUNTER — Other Ambulatory Visit: Payer: Self-pay | Admitting: *Deleted

## 2013-12-09 DIAGNOSIS — M7989 Other specified soft tissue disorders: Secondary | ICD-10-CM

## 2013-12-10 ENCOUNTER — Encounter (HOSPITAL_COMMUNITY): Payer: Medicare Other

## 2013-12-10 ENCOUNTER — Encounter: Payer: Self-pay | Admitting: Family

## 2013-12-10 ENCOUNTER — Other Ambulatory Visit (HOSPITAL_COMMUNITY): Payer: Medicare Other

## 2013-12-10 ENCOUNTER — Ambulatory Visit: Payer: Medicare Other | Admitting: Vascular Surgery

## 2013-12-10 ENCOUNTER — Ambulatory Visit: Payer: Medicare Other | Admitting: Family

## 2013-12-12 ENCOUNTER — Ambulatory Visit (HOSPITAL_COMMUNITY)
Admission: RE | Admit: 2013-12-12 | Discharge: 2013-12-12 | Disposition: A | Discharge status: Home / Self Care | Payer: Medicare Other | Source: Ambulatory Visit | Attending: Family | Admitting: Family

## 2013-12-12 ENCOUNTER — Ambulatory Visit: Payer: Medicare Other | Admitting: Family

## 2013-12-12 DIAGNOSIS — M7989 Other specified soft tissue disorders: Secondary | ICD-10-CM

## 2014-01-07 ENCOUNTER — Encounter (HOSPITAL_BASED_OUTPATIENT_CLINIC_OR_DEPARTMENT_OTHER): Payer: Medicare Other | Attending: General Surgery

## 2014-03-31 ENCOUNTER — Encounter: Payer: Self-pay | Admitting: Family

## 2014-04-01 ENCOUNTER — Ambulatory Visit (INDEPENDENT_AMBULATORY_CARE_PROVIDER_SITE_OTHER)
Admission: RE | Admit: 2014-04-01 | Discharge: 2014-04-01 | Disposition: A | Discharge status: Home / Self Care | Payer: Medicare Other | Source: Ambulatory Visit | Attending: Vascular Surgery | Admitting: Vascular Surgery

## 2014-04-01 ENCOUNTER — Ambulatory Visit (INDEPENDENT_AMBULATORY_CARE_PROVIDER_SITE_OTHER): Payer: Medicare Other | Admitting: Family

## 2014-04-01 ENCOUNTER — Encounter: Payer: Self-pay | Admitting: Family

## 2014-04-01 ENCOUNTER — Ambulatory Visit (HOSPITAL_COMMUNITY)
Admission: RE | Admit: 2014-04-01 | Discharge: 2014-04-01 | Disposition: A | Discharge status: Home / Self Care | Payer: Medicare Other | Source: Ambulatory Visit | Attending: Family | Admitting: Family

## 2014-04-01 ENCOUNTER — Encounter (HOSPITAL_BASED_OUTPATIENT_CLINIC_OR_DEPARTMENT_OTHER): Payer: Medicare Other | Attending: General Surgery

## 2014-04-01 ENCOUNTER — Ambulatory Visit (INDEPENDENT_AMBULATORY_CARE_PROVIDER_SITE_OTHER)
Admission: RE | Admit: 2014-04-01 | Discharge: 2014-04-01 | Disposition: A | Discharge status: Home / Self Care | Payer: Medicare Other | Source: Ambulatory Visit | Attending: Family | Admitting: Family

## 2014-04-01 VITALS — BP 92/61 | HR 73 | Temp 97.2°F | Resp 16 | Ht 64.0 in | Wt 138.0 lb

## 2014-04-01 DIAGNOSIS — I739 Peripheral vascular disease, unspecified: Secondary | ICD-10-CM | POA: Insufficient documentation

## 2014-04-01 DIAGNOSIS — I6529 Occlusion and stenosis of unspecified carotid artery: Secondary | ICD-10-CM

## 2014-04-01 DIAGNOSIS — Z48812 Encounter for surgical aftercare following surgery on the circulatory system: Secondary | ICD-10-CM

## 2014-04-01 DIAGNOSIS — M79675 Pain in left toe(s): Secondary | ICD-10-CM

## 2014-04-01 DIAGNOSIS — E1169 Type 2 diabetes mellitus with other specified complication: Secondary | ICD-10-CM | POA: Diagnosis present

## 2014-04-01 DIAGNOSIS — M79609 Pain in unspecified limb: Secondary | ICD-10-CM

## 2014-04-01 DIAGNOSIS — L98499 Non-pressure chronic ulcer of skin of other sites with unspecified severity: Principal | ICD-10-CM | POA: Insufficient documentation

## 2014-04-01 DIAGNOSIS — R0989 Other specified symptoms and signs involving the circulatory and respiratory systems: Secondary | ICD-10-CM

## 2014-04-01 DIAGNOSIS — M79601 Pain in right arm: Secondary | ICD-10-CM

## 2014-04-01 DIAGNOSIS — L97509 Non-pressure chronic ulcer of other part of unspecified foot with unspecified severity: Secondary | ICD-10-CM | POA: Insufficient documentation

## 2014-04-01 NOTE — Patient Instructions (Signed)
Peripheral Vascular Disease Peripheral Vascular Disease (PVD), also called Peripheral Arterial Disease (PAD), is a circulation problem caused by cholesterol (atherosclerotic plaque) deposits in the arteries. PVD commonly occurs in the lower extremities (legs) but it can occur in other areas of the body, such as your arms. The cholesterol buildup in the arteries reduces blood flow which can cause pain and other serious problems. The presence of PVD can place a person at risk for Coronary Artery Disease (CAD).  CAUSES  Causes of PVD can be many. It is usually associated with more than one risk factor such as:   High Cholesterol.  Smoking.  Diabetes.  Lack of exercise or inactivity.  High blood pressure (hypertension).  Obesity.  Family history. SYMPTOMS   When the lower extremities are affected, patients with PVD may experience:  Leg pain with exertion or physical activity. This is called INTERMITTENT CLAUDICATION. This may present as cramping or numbness with physical activity. The location of the pain is associated with the level of blockage. For example, blockage at the abdominal level (distal abdominal aorta) may result in buttock or hip pain. Lower leg arterial blockage may result in calf pain.  As PVD becomes more severe, pain can develop with less physical activity.  In people with severe PVD, leg pain may occur at rest.  Other PVD signs and symptoms:  Leg numbness or weakness.  Coldness in the affected leg or foot, especially when compared to the other leg.  A change in leg color.  Patients with significant PVD are more prone to ulcers or sores on toes, feet or legs. These may take longer to heal or may reoccur. The ulcers or sores can become infected.  If signs and symptoms of PVD are ignored, gangrene may occur. This can result in the loss of toes or loss of an entire limb.  Not all leg pain is related to PVD. Other medical conditions can cause leg pain such  as:  Blood clots (embolism) or Deep Vein Thrombosis.  Inflammation of the blood vessels (vasculitis).  Spinal stenosis. DIAGNOSIS  Diagnosis of PVD can involve several different types of tests. These can include:  Pulse Volume Recording Method (PVR). This test is simple, painless and does not involve the use of X-rays. PVR involves measuring and comparing the blood pressure in the arms and legs. An ABI (Ankle-Brachial Index) is calculated. The normal ratio of blood pressures is 1. As this number becomes smaller, it indicates more severe disease.  < 0.95 - indicates significant narrowing in one or more leg vessels.  <0.8 - there will usually be pain in the foot, leg or buttock with exercise.  <0.4 - will usually have pain in the legs at rest.  <0.25 - usually indicates limb threatening PVD.  Doppler detection of pulses in the legs. This test is painless and checks to see if you have a pulses in your legs/feet.  A dye or contrast material (a substance that highlights the blood vessels so they show up on x-ray) may be given to help your caregiver better see the arteries for the following tests. The dye is eliminated from your body by the kidney's. Your caregiver may order blood work to check your kidney function and other laboratory values before the following tests are performed:  Magnetic Resonance Angiography (MRA). An MRA is a picture study of the blood vessels and arteries. The MRA machine uses a large magnet to produce images of the blood vessels.  Computed Tomography Angiography (CTA). A CTA   is a specialized x-ray that looks at how the blood flows in your blood vessels. An IV may be inserted into your arm so contrast dye can be injected.  Angiogram. Is a procedure that uses x-rays to look at your blood vessels. This procedure is minimally invasive, meaning a small incision (cut) is made in your groin. A small tube (catheter) is then inserted into the artery of your groin. The catheter  is guided to the blood vessel or artery your caregiver wants to examine. Contrast dye is injected into the catheter. X-rays are then taken of the blood vessel or artery. After the images are obtained, the catheter is taken out. TREATMENT  Treatment of PVD involves many interventions which may include:  Lifestyle changes:  Quitting smoking.  Exercise.  Following a low fat, low cholesterol diet.  Control of diabetes.  Foot care is very important to the PVD patient. Good foot care can help prevent infection.  Medication:  Cholesterol-lowering medicine.  Blood pressure medicine.  Anti-platelet drugs.  Certain medicines may reduce symptoms of Intermittent Claudication.  Interventional/Surgical options:  Angioplasty. An Angioplasty is a procedure that inflates a balloon in the blocked artery. This opens the blocked artery to improve blood flow.  Stent Implant. A wire mesh tube (stent) is placed in the artery. The stent expands and stays in place, allowing the artery to remain open.  Peripheral Bypass Surgery. This is a surgical procedure that reroutes the blood around a blocked artery to help improve blood flow. This type of procedure may be performed if Angioplasty or stent implants are not an option. SEEK IMMEDIATE MEDICAL CARE IF:   You develop pain or numbness in your arms or legs.  Your arm or leg turns cold, becomes blue in color.  You develop redness, warmth, swelling and pain in your arms or legs. MAKE SURE YOU:   Understand these instructions.  Will watch your condition.  Will get help right away if you are not doing well or get worse. Document Released: 07/28/2004 Document Revised: 09/12/2011 Document Reviewed: 06/24/2008 ExitCare Patient Information 2015 [employer]. This information is not intended to replace advice given to you by your health care provider. Make sure you discuss any questions you have with your health care provider.   Stroke  Prevention Some medical conditions and behaviors are associated with an increased chance of having a stroke. You may prevent a stroke by making healthy choices and managing medical conditions. HOW CAN I REDUCE MY RISK OF HAVING A STROKE?   Stay physically active. Get at least 30 minutes of activity on most or all days.  Do not smoke. It may also be helpful to avoid exposure to secondhand smoke.  Limit alcohol use. Moderate alcohol use is considered to be:  No more than 2 drinks per day for men.  No more than 1 drink per day for nonpregnant women.  Eat healthy foods. This involves:  Eating 5 or more servings of fruits and vegetables a day.  Making dietary changes that address high blood pressure (hypertension), high cholesterol, diabetes, or obesity.  Manage your cholesterol levels.  Making food choices that are high in fiber and low in saturated fat, trans fat, and cholesterol may control cholesterol levels.  Take any prescribed medicines to control cholesterol as directed by your health care provider.  Manage your diabetes.  Controlling your carbohydrate and sugar intake is recommended to manage diabetes.  Take any prescribed medicines to control diabetes as directed by your health care provider.    Control your hypertension.  Making food choices that are low in salt (sodium), saturated fat, trans fat, and cholesterol is recommended to manage hypertension.  Take any prescribed medicines to control hypertension as directed by your health care provider.  Maintain a healthy weight.  Reducing calorie intake and making food choices that are low in sodium, saturated fat, trans fat, and cholesterol are recommended to manage weight.  Stop drug abuse.  Avoid taking birth control pills.  Talk to your health care provider about the risks of taking birth control pills if you are over 35 years old, smoke, get migraines, or have ever had a blood clot.  Get evaluated for sleep  disorders (sleep apnea).  Talk to your health care provider about getting a sleep evaluation if you snore a lot or have excessive sleepiness.  Take medicines only as directed by your health care provider.  For some people, aspirin or blood thinners (anticoagulants) are helpful in reducing the risk of forming abnormal blood clots that can lead to stroke. If you have the irregular heart rhythm of atrial fibrillation, you should be on a blood thinner unless there is a good reason you cannot take them.  Understand all your medicine instructions.  Make sure that other conditions (such as anemia or atherosclerosis) are addressed. SEEK IMMEDIATE MEDICAL CARE IF:   You have sudden weakness or numbness of the face, arm, or leg, especially on one side of the body.  Your face or eyelid droops to one side.  You have sudden confusion.  You have trouble speaking (aphasia) or understanding.  You have sudden trouble seeing in one or both eyes.  You have sudden trouble walking.  You have dizziness.  You have a loss of balance or coordination.  You have a sudden, severe headache with no known cause.  You have new chest pain or an irregular heartbeat. Any of these symptoms may represent a serious problem that is an emergency. Do not wait to see if the symptoms will go away. Get medical help at once. Call your local emergency services (911 in [place] Do not drive yourself to the hospital. Document Released: 07/28/2004 Document Revised: 11/04/2013 Document Reviewed: 12/21/2012 ExitCare Patient Information 2015 [employer]. This information is not intended to replace advice given to you by your health care provider. Make sure you discuss any questions you have with your health care provider.  

## 2014-04-01 NOTE — Progress Notes (Signed)
[facility] [place] HISTORY AND PHYSICAL -PAD  History of Present Illness [name] is a 68 y.o. female patient of Dr. Donnetta Hutching who is s/p aorto-bifem BPG 2013, left fem-pop BPG 2009, and right BKA 2013.  She is here for scheduled follow up and C/O Left popliteal pain, duration 3-4 days, left great toe, non-healing wound, duration- 3 mo. Tx. by Wound Center: [facility].  Has been taking Keflex for 3-4 weeks.  Using Santyl on the left great toe ulcer, is about the same as 3 months ago, will have an x-ray soon to see if osteo involvement., sees Dr. Lindon Romp at the wound care center.  Walks some with her walker.  Pt denies any stroke or TIA history.  Pt is wearing right BKA prosthesis, she is sitting in a wheelchair.  She has had a UTI/kidney infection for several months, under treatment, will be seeing a urologist, her blood sugar is up as a result.  Pt Diabetic: Yes, last A1C was under 9.0, states this has improved Pt smoker: former smoker, quit in 2003  Pt meds include:  Statin :Yes, takes 10 mg Crestor once/week due to myalgias  ASA: Yes  Other anticoagulants/antiplatelets: Plavix   Past Medical History  Diagnosis Date  . Hypertension     Unspecified  . Hyperlipidemia     Mixed  . Tobacco abuse     Remote  . Loose, teeth     has loose bridge and two loose teeth holding it  . PONV (postoperative nausea and vomiting)   . Type II diabetes mellitus   . GERD (gastroesophageal reflux disease)   . Anxiety   . Peripheral vascular disease     unspecified, a. s/p L CEA b. s/p B fem-pop bypass  . Raynaud disease   . Stones in the urinary tract   . UTI (urinary tract infection)   . Anemia     hx  . Coronary artery disease     a. s/p NSTEMI 02/2009 - PCI LCX with Xience DES. Otherwise branch vessel and Dist. RCA dzs. NL EF.   Marland Kitchen Myocardial infarction   . CHF (congestive heart failure)   . Kidney stones   . UTI (lower urinary tract infection)   . Constipation   .  Hernia, umbilical   . History of blood transfusion   . Fall from slipping on wet surface Jan. 7, 2014    Right stump bleeding from fall    Social History History  Substance Use Topics  . Smoking status: Former Smoker -- 2.00 packs/day for 39 years    Types: Cigarettes    Quit date: 07/05/2001  . Smokeless tobacco: Never Used  . Alcohol Use: 7.0 oz/week    14 drink(s) per week    Family History Family History  Problem Relation Age of Onset  . Heart attack Mother   . Heart attack Father   . Hypertension Brother   . Diabetes Brother   . Hypertension Brother     Past Surgical History  Procedure Laterality Date  . Vesicovaginal fistula closure w/ tah    . Colonoscopy  11/2010  . Upper gastrointestinal endoscopy  11/2010  . Femoral artery - popliteal artery bypass graft      left  . Abdominal hysterectomy      complete  . Femoral-popliteal bypass graft  08/22/2011    Procedure: BYPASS GRAFT FEMORAL-POPLITEAL ARTERY;  Surgeon: Curt Jews, MD;  Location: [facility];  Service: Vascular;  Laterality: Right;  Thrombectomy and  Revision using 19m x 10cm stretch goretex graft  . Intraoperative arteriogram  08/22/2011    Procedure: INTRA OPERATIVE ARTERIOGRAM;  Surgeon: TCurt Jews MD;  [facility];  Service: Vascular;  Laterality: Right;  to lower leg  . Multiple tooth extractions  08-29-2011    5 teeth extracted   . Pci  01/17/12    RLE  . Breast lumpectomy      right  . Tonsillectomy and adenoidectomy    . Coronary angioplasty with stent placement    . Carotid endarterectomy  ~ 2005    left  . Aorta - bilateral femoral artery bypass graft  03/26/2012    Procedure: AORTA BIFEMORAL BYPASS GRAFT;  Surgeon: TRosetta Posner MD;  [facility];  Service: Vascular;  Laterality: N/A;  Aortic-bifemoral bypass using 14x837mHemashield graft .   . Umbilical hernia repair  03/26/2012    Procedure: HERNIA REPAIR UMBILICAL ADULT;  Surgeon: ToRosetta PosnerMD;  [facility];  Service: Vascular;   Laterality: N/A;  Removal of Umbilical hernia sac  . Femoral artery exploration  03/26/2012    Procedure: FEMORAL ARTERY EXPLORATION;  Surgeon: ToRosetta PosnerMD;  [facility];  Service: Vascular;  Laterality: Right;  with Revision of Popliteal-Peroneal bypass graft using 22m71m 10cm thin wall goretex graft  . Amputation  04/23/2012    Procedure: AMPUTATION BELOW KNEE;  Surgeon: TodRosetta PosnerD;  [facility];  Laterality: Right;  . Eye surgery Right Feb. 9, 2015    Cataract  . Eye surgery Left Feb. 16, 2015    Cataract    Allergies  Allergen Reactions  . Codeine Nausea And Vomiting  . Erythromycin Nausea And Vomiting  . Hydrocodone-Acetaminophen Nausea And Vomiting  . Hydromorphone Nausea And Vomiting  . Propoxyphene Hcl Nausea And Vomiting  . Statins Other (See Comments)    Leg cramps  . Acetaminophen Other (See Comments)    Does not tolerate well, nausea  . Eggs Or Egg-Derived Products     "pt does not eat"  . Ibuprofen Other (See Comments)    Does not tolerate well  . Shellfish-Derived Products     "pt does not eat"    Current Outpatient Prescriptions  Medication Sig Dispense Refill  . ALPRAZolam (XANAX) 0.25 MG tablet Take 1 tablet (0.25 mg total) by mouth 3 (three) times daily as needed for anxiety.  60 tablet  0  . aspirin 325 MG EC tablet Take 325 mg by mouth every morning.       . BMarland KitchenSIVANCE 0.6 % SUSP       . calcium carbonate (TUMS) 500 MG chewable tablet Chew 1-4 tablets by mouth daily as needed. For acid reflux.      . Cholecalciferol 25000 UNITS CAPS Take 25,000 Units by mouth 3 (three) times a week. Monday, Wednesday, friday      . ciprofloxacin (CIPRO) 500 MG tablet Take 500 mg by mouth 2 (two) times daily.      . clopidogrel (PLAVIX) 75 MG tablet Take 75 mg by mouth daily.      . dMarland Kitchenxlansoprazole (DEXILANT) 60 MG capsule Take 60 mg by mouth 2 (two) times daily.      . DUREZOL 0.05 % EMUL Apply topically 2 (two) times daily.       .  furosemide (LASIX) 40 MG tablet Take 1 tablet (40 mg total) by mouth daily.  30 tablet  1  . glyBURIDE (DIABETA) 2.5 MG tablet       .  iron polysaccharides (NIFEREX) 150 MG capsule Take 150 mg by mouth 3 (three) times daily.       . isosorbide mononitrate (IMDUR) 60 MG 24 hr tablet Take 1 tablet (60 mg total) by mouth daily.  30 tablet  1  . KLOR-CON M10 10 MEQ tablet       . meperidine (DEMEROL) 50 MG tablet Take 1 tablet (50 mg total) by mouth every 6 (six) hours as needed. For pain  30 tablet  0  . metFORMIN (GLUMETZA) 1000 MG (MOD) 24 hr tablet Take 1,000 mg by mouth daily with breakfast.      . metoprolol (LOPRESSOR) 50 MG tablet Take 1 tablet (50 mg total) by mouth 2 (two) times daily.  60 tablet  1  . metoprolol succinate (TOPROL-XL) 25 MG 24 hr tablet       . nitroGLYCERIN (NITROSTAT) 0.4 MG SL tablet Place 0.4 mg under the tongue every 5 (five) minutes as needed. For chest pain      . omega-3 acid ethyl esters (LOVAZA) 1 G capsule Take 2 g by mouth 2 (two) times daily.       . pioglitazone-metformin (ACTOPLUS MET) 15-500 MG per tablet Take 1 tablet by mouth daily. Brand name only-does not work if components are given separately per patient      . PLAVIX 75 MG tablet TAKE 1 TABLET BY MOUTH ONCE DAILY WITH A MEAL  30 tablet  5  . potassium chloride (K-DUR) 10 MEQ tablet Take 1 tablet (10 mEq total) by mouth 2 (two) times daily.  60 tablet  1  . Probiotic Product (ACIDOPHILUS PEARLS PO) Take 1 capsule by mouth daily.      . prochlorperazine (COMPAZINE) 10 MG tablet Take 1 tablet (10 mg total) by mouth every 6 (six) hours as needed. For nausea  30 tablet  2  . ramipril (ALTACE) 1.25 MG capsule Take 1 capsule (1.25 mg total) by mouth 2 (two) times daily.  60 capsule  1  . SANTYL ointment       . sitaGLIPtan (JANUVIA) 100 MG tablet Take 100 mg by mouth at bedtime.       . sulfamethoxazole-trimethoprim (BACTRIM DS) 800-160 MG per tablet Take 1 tablet by mouth 2 (two) times daily.      . Vitamin  D, Ergocalciferol, (DRISDOL) 50000 UNITS CAPS Take 1 capsule (50,000 Units total) by mouth every 7 (seven) days.  30 capsule  1   No current facility-administered medications for this visit.    ROS: See HPI for pertinent positives and negatives.   Physical Examination  Filed Vitals:   04/01/14 1628  BP: 92/61  Pulse: 73  Temp: 97.2 F (36.2 C)  TempSrc: Oral  Resp: 16  Height: _0  (1.626 m)  Weight: 138 lb (62.596 kg)   Body mass index is 23.68 kg/(m^2).  General: A&O x 3, WDWN.  Gait: in w/c  Eyes: Pupils equal  Pulmonary: CTAB, without wheezes , rales or rhonchi.  Cardiac: regular Rythm , without detected murmur.   Carotid Bruits  Left  Right    Positive  Negative    Aorta is not palpable.  Radial pulses: absent left radial and ulnar , 1+ left brachial; 1+ right radial and ulnar, 2+ right brachial   VASCULAR EXAM:  Extremities without ischemic changes  without Gangrene; with non draining ulcer left great toe, right third finger tip moist non draining ulcer.   LE Pulses  LEFT  RIGHT   FEMORAL  3+  palpable  2+ palpable   POPLITEAL  not palpable  not palpable   POSTERIOR TIBIAL  not palpable  BKA   DORSALIS PEDIS  ANTERIOR TIBIAL  Not palpable  BKA   Abdomen: soft, NT, no masses.  Skin: no rashes, no ulcers noted.  Musculoskeletal: no muscle wasting or atrophy.  Neurologic: A&O X 3; Appropriate Affect ; SENSATION: normal; MOTOR FUNCTION: moving all extremities equally, motor strength 5/5 throughout. Speech is fluent/normal. CN 2-12 grossly intact.   Non-Invasive Vascular Imaging: DATE: 04/01/2014 LOWER EXTREMITY ARTERIAL DUPLEX EVALUATION    INDICATION: Peripheral vascular disease     PREVIOUS INTERVENTION(S): Left femoral to above-knee popliteal artery bypass graft on 07/09/2007. Aortobifemoral bypass graft on 03/26/2012; Right below knee amputation 2013.    DUPLEX EXAM:     RIGHT  LEFT   Peak Systolic Velocity (cm/s) Ratio (if abnormal) Waveform  Peak  Systolic Velocity (cm/s) Ratio (if abnormal) Waveform     Inflow Artery 22  M      Proximal Anastomosis 66  M     Proximal Graft 51  M     Mid Graft 94  M      Distal Graft 73  M     Distal Anastomosis 121  M     Outflow Artery 437  M  BKA Today's ABI / TBI 0.67   Previous ABI / TBI (12/05/2013  ) 0.69    Waveform:    M - Monophasic       B - Biphasic       T - Triphasic  If Ankle Brachial Index (ABI) or Toe Brachial Index (TBI) performed, please see complete report     ADDITIONAL FINDINGS: Monophasic flow noted in the patent left limb of aortobifemoral bypass graft.    IMPRESSION: Patent left leg bypass graft with no evidence of stenosis noted within the graft. Greater than 50% stenosis of the left native outflow artery.     Compared to the previous exam:  No significant change in comparison to the last exam on 12/05/2013.    CEREBROVASCULAR DUPLEX EVALUATION    INDICATION: Carotid artery disease     PREVIOUS INTERVENTION(S): Left carotid endarterectomy 09/02/2003.    DUPLEX EXAM:     RIGHT  LEFT  Peak Systolic Velocities (cm/s) End Diastolic Velocities (cm/s) Plaque LOCATION Peak Systolic Velocities (cm/s) End Diastolic Velocities (cm/s) Plaque  234 61 HT CCA PROXIMAL 105 31 HT  56 16 HT CCA MID 86 26 HT  33 12 HT CCA DISTAL 120 37 HT  34 8 HT ECA 453 37 HT  24 8 HT ICA PROXIMAL 174 49 HT  57 19 HT ICA MID 143 50 HT  62 24  ICA DISTAL 168 68      ICA / CCA Ratio (PSV)   Antegrade  Vertebral Flow Not Visualized   657 Brachial Systolic Pressure (mmHg) 88   Brachial Artery Waveforms     Plaque Morphology:  HM = Homogeneous, HT = Heterogeneous, CP = Calcific Plaque, SP = Smooth Plaque, IP = Irregular Plaque     ADDITIONAL FINDINGS: Left vertebral artery not visualized with a brachial pressure difference.    IMPRESSION: Proximal right common carotid artery stenosis. Right internal carotid artery velocities suggest a <40% stenosis based on velocity criteria; actual  category of disease may be underestimated due to proximal disease. Patent left carotid endarterectomy site with velocities suggestive of a 40-59% stenosis (high end of range).      Compared to the previous  exam:  Disease progression since last exam performed here in 2013.     ASSESSMENT: Mialynn Shelvin is a 68 y.o. female who presents s/p left femoral to above-knee popliteal artery bypass graft on 07/09/2007, aortobifemoral bypass graft on 03/26/2012, right below knee amputation 2013, and left carotid endarterectomy on 09/02/2003. Left great toe has less erythema than June visit this year, ulcer seems slightly smaller or about the same size, pt reports plans by Dr. Karn Cassis to image foot to evaluate for possible evidence of osteomyelitis. She has developed an ulcer at the tip of her right third finger. Dr. Donnetta Hutching spoke with patient and husband and examined patient. In the background of her known Raynaud's syndrome, long term UTI which raises her serum glucose, her wound healing capacity is challenging and wounds take a long time to heal. Dr. Donnetta Hutching felt that any vascular intervention would be of limited if any value, and may cause more harm than good. She has an appointment to see a urologist soon to try to address her chronic UTI. Defer to Dr. Mardene Sayer for continued wound care management, consider a calcium channel blocker, if appropriate for this patient, as this class of medication facilitates vasodilation of the periphery, defer to Dr. Forde Dandy or Dr. Mardene Sayer.  Today's carotid Duplex demonstrates proximal right common carotid artery stenosis. Right internal carotid artery velocities suggest a <40% stenosis based on velocity criteria; actual category of disease may be underestimated due to proximal disease. Patent left carotid endarterectomy site with velocities suggestive of a 40-59% stenosis (high end of range).  Disease progression since last exam performed here in 2013.  Pt denies any stroke or TIA  history.   PLAN:  I discussed in depth with the patient the nature of atherosclerosis, and emphasized the importance of maximal medical management including strict control of blood pressure, blood glucose, and lipid levels, obtaining regular exercise, and continued cessation of smoking.  The patient is aware that without maximal medical management the underlying atherosclerotic disease process will progress, limiting the benefit of any interventions.  Based on the patient's vascular studies and examination, pt will return to clinic in 3 months with ABI's, left LE arterial Duplex, and carotid Duplex, follow up with Dr. Donnetta Hutching for all future visits.  The patient was given information about PAD including signs, symptoms, treatment, what symptoms should prompt the patient to seek immediate medical care, and risk reduction measures to take.  Clemon Chambers, RN, MSN, FNP-C [facility] Phone: [phone]  Clinic MD: Early  04/01/2014 3:04 PM

## 2014-04-07 ENCOUNTER — Ambulatory Visit: Payer: Medicare Other | Admitting: Cardiovascular Disease

## 2014-04-09 ENCOUNTER — Other Ambulatory Visit (HOSPITAL_BASED_OUTPATIENT_CLINIC_OR_DEPARTMENT_OTHER): Payer: Self-pay | Admitting: General Surgery

## 2014-04-09 ENCOUNTER — Ambulatory Visit (HOSPITAL_COMMUNITY)
Admission: RE | Admit: 2014-04-09 | Discharge: 2014-04-09 | Disposition: A | Discharge status: Home / Self Care | Payer: Medicare Other | Source: Ambulatory Visit | Attending: Diagnostic Radiology | Admitting: Diagnostic Radiology

## 2014-04-09 DIAGNOSIS — X58XXXA Exposure to other specified factors, initial encounter: Secondary | ICD-10-CM | POA: Insufficient documentation

## 2014-04-09 DIAGNOSIS — M869 Osteomyelitis, unspecified: Secondary | ICD-10-CM

## 2014-04-09 DIAGNOSIS — S91102A Unspecified open wound of left great toe without damage to nail, initial encounter: Secondary | ICD-10-CM | POA: Diagnosis not present

## 2014-04-15 ENCOUNTER — Encounter (HOSPITAL_BASED_OUTPATIENT_CLINIC_OR_DEPARTMENT_OTHER): Payer: Medicare Other | Attending: General Surgery

## 2014-04-15 DIAGNOSIS — L97529 Non-pressure chronic ulcer of other part of left foot with unspecified severity: Secondary | ICD-10-CM | POA: Insufficient documentation

## 2014-04-15 DIAGNOSIS — E11621 Type 2 diabetes mellitus with foot ulcer: Secondary | ICD-10-CM | POA: Insufficient documentation

## 2014-04-15 DIAGNOSIS — Z89511 Acquired absence of right leg below knee: Secondary | ICD-10-CM | POA: Insufficient documentation

## 2014-04-15 DIAGNOSIS — I7301 Raynaud's syndrome with gangrene: Secondary | ICD-10-CM | POA: Insufficient documentation

## 2014-04-22 DIAGNOSIS — E11621 Type 2 diabetes mellitus with foot ulcer: Secondary | ICD-10-CM | POA: Diagnosis present

## 2014-04-22 DIAGNOSIS — Z89511 Acquired absence of right leg below knee: Secondary | ICD-10-CM | POA: Diagnosis not present

## 2014-04-22 DIAGNOSIS — L97529 Non-pressure chronic ulcer of other part of left foot with unspecified severity: Secondary | ICD-10-CM | POA: Diagnosis not present

## 2014-04-22 DIAGNOSIS — I7301 Raynaud's syndrome with gangrene: Secondary | ICD-10-CM | POA: Diagnosis not present

## 2014-05-01 ENCOUNTER — Telehealth: Payer: Self-pay | Admitting: Cardiovascular Disease

## 2014-05-01 ENCOUNTER — Other Ambulatory Visit (HOSPITAL_COMMUNITY): Payer: Self-pay | Admitting: Orthopedic Surgery

## 2014-05-01 DIAGNOSIS — S41101S Unspecified open wound of right upper arm, sequela: Secondary | ICD-10-CM

## 2014-05-01 NOTE — Telephone Encounter (Signed)
I will forward message to Dr Excell Seltzer to review.

## 2014-05-01 NOTE — Telephone Encounter (Signed)
NEEDS OK TO HOLD PLAVIX FOR FIVE DAYS PRIOR TO ANGIOGRAM, PLS PUT OK/OR NOT IN EPIC MESSAGE, TIFFANY WILL RETRIVE IT

## 2014-05-03 NOTE — Telephone Encounter (Signed)
Ok from cardiac perspective to hold plavix 5 days if needed. Usually wouldn't need to hold plavix for an angiogram but I don't have any details of the specific procedure she's having.   Tonny Bollman 05/03/2014 5:00 AM

## 2014-05-13 ENCOUNTER — Other Ambulatory Visit (HOSPITAL_COMMUNITY): Payer: Medicare Other

## 2014-05-13 ENCOUNTER — Other Ambulatory Visit: Payer: Self-pay | Admitting: Radiology

## 2014-05-13 ENCOUNTER — Encounter (HOSPITAL_BASED_OUTPATIENT_CLINIC_OR_DEPARTMENT_OTHER): Payer: Medicare Other | Attending: General Surgery

## 2014-05-13 DIAGNOSIS — E11621 Type 2 diabetes mellitus with foot ulcer: Secondary | ICD-10-CM | POA: Insufficient documentation

## 2014-05-13 DIAGNOSIS — L98499 Non-pressure chronic ulcer of skin of other sites with unspecified severity: Secondary | ICD-10-CM | POA: Diagnosis not present

## 2014-05-13 DIAGNOSIS — L97529 Non-pressure chronic ulcer of other part of left foot with unspecified severity: Secondary | ICD-10-CM | POA: Diagnosis not present

## 2014-05-13 DIAGNOSIS — I7090 Unspecified atherosclerosis: Secondary | ICD-10-CM | POA: Insufficient documentation

## 2014-05-14 ENCOUNTER — Encounter (HOSPITAL_COMMUNITY): Payer: Self-pay

## 2014-05-14 ENCOUNTER — Other Ambulatory Visit (HOSPITAL_COMMUNITY): Payer: Self-pay | Admitting: Orthopedic Surgery

## 2014-05-14 ENCOUNTER — Ambulatory Visit (HOSPITAL_COMMUNITY)
Admission: RE | Admit: 2014-05-14 | Discharge: 2014-05-14 | Disposition: A | Discharge status: Home / Self Care | Payer: Medicare Other | Source: Ambulatory Visit | Attending: Orthopedic Surgery | Admitting: Orthopedic Surgery

## 2014-05-14 DIAGNOSIS — Z87891 Personal history of nicotine dependence: Secondary | ICD-10-CM | POA: Diagnosis not present

## 2014-05-14 DIAGNOSIS — E785 Hyperlipidemia, unspecified: Secondary | ICD-10-CM | POA: Diagnosis not present

## 2014-05-14 DIAGNOSIS — I509 Heart failure, unspecified: Secondary | ICD-10-CM | POA: Insufficient documentation

## 2014-05-14 DIAGNOSIS — L98499 Non-pressure chronic ulcer of skin of other sites with unspecified severity: Secondary | ICD-10-CM | POA: Insufficient documentation

## 2014-05-14 DIAGNOSIS — I252 Old myocardial infarction: Secondary | ICD-10-CM | POA: Insufficient documentation

## 2014-05-14 DIAGNOSIS — K219 Gastro-esophageal reflux disease without esophagitis: Secondary | ICD-10-CM | POA: Insufficient documentation

## 2014-05-14 DIAGNOSIS — Z89511 Acquired absence of right leg below knee: Secondary | ICD-10-CM | POA: Insufficient documentation

## 2014-05-14 DIAGNOSIS — N390 Urinary tract infection, site not specified: Secondary | ICD-10-CM | POA: Diagnosis not present

## 2014-05-14 DIAGNOSIS — S41101S Unspecified open wound of right upper arm, sequela: Secondary | ICD-10-CM

## 2014-05-14 DIAGNOSIS — I7025 Atherosclerosis of native arteries of other extremities with ulceration: Secondary | ICD-10-CM | POA: Insufficient documentation

## 2014-05-14 DIAGNOSIS — I10 Essential (primary) hypertension: Secondary | ICD-10-CM | POA: Diagnosis not present

## 2014-05-14 DIAGNOSIS — I2510 Atherosclerotic heart disease of native coronary artery without angina pectoris: Secondary | ICD-10-CM | POA: Insufficient documentation

## 2014-05-14 DIAGNOSIS — E119 Type 2 diabetes mellitus without complications: Secondary | ICD-10-CM | POA: Diagnosis not present

## 2014-05-14 DIAGNOSIS — I7300 Raynaud's syndrome without gangrene: Secondary | ICD-10-CM | POA: Diagnosis not present

## 2014-05-14 DIAGNOSIS — F419 Anxiety disorder, unspecified: Secondary | ICD-10-CM | POA: Insufficient documentation

## 2014-05-14 LAB — CBC WITH DIFFERENTIAL/PLATELET
Basophils Absolute: 0 10*3/uL (ref 0.0–0.1)
Basophils Relative: 0 % (ref 0–1)
EOS ABS: 0.1 10*3/uL (ref 0.0–0.7)
Eosinophils Relative: 1 % (ref 0–5)
HCT: 29.5 % — ABNORMAL LOW (ref 36.0–46.0)
HEMOGLOBIN: 9.1 g/dL — AB (ref 12.0–15.0)
LYMPHS PCT: 14 % (ref 12–46)
Lymphs Abs: 1.2 10*3/uL (ref 0.7–4.0)
MCH: 23.8 pg — ABNORMAL LOW (ref 26.0–34.0)
MCHC: 30.8 g/dL (ref 30.0–36.0)
MCV: 77 fL — ABNORMAL LOW (ref 78.0–100.0)
Monocytes Absolute: 0.4 10*3/uL (ref 0.1–1.0)
Monocytes Relative: 5 % (ref 3–12)
NEUTROS ABS: 7.1 10*3/uL (ref 1.7–7.7)
NEUTROS PCT: 80 % — AB (ref 43–77)
Platelets: 422 10*3/uL — ABNORMAL HIGH (ref 150–400)
RBC: 3.83 MIL/uL — ABNORMAL LOW (ref 3.87–5.11)
RDW: 21.2 % — ABNORMAL HIGH (ref 11.5–15.5)
WBC: 8.8 10*3/uL (ref 4.0–10.5)

## 2014-05-14 LAB — BASIC METABOLIC PANEL
ANION GAP: 15 (ref 5–15)
BUN: 16 mg/dL (ref 6–23)
CHLORIDE: 95 meq/L — AB (ref 96–112)
CO2: 26 mEq/L (ref 19–32)
CREATININE: 0.82 mg/dL (ref 0.50–1.10)
Calcium: 9.8 mg/dL (ref 8.4–10.5)
GFR calc non Af Amer: 71 mL/min — ABNORMAL LOW (ref >90)
GFR, EST AFRICAN AMERICAN: 83 mL/min — AB (ref >90)
Glucose, Bld: 241 mg/dL — ABNORMAL HIGH (ref 70–99)
Potassium: 4.8 mEq/L (ref 3.7–5.3)
Sodium: 136 mEq/L — ABNORMAL LOW (ref 137–147)

## 2014-05-14 LAB — GLUCOSE, CAPILLARY: GLUCOSE-CAPILLARY: 202 mg/dL — AB (ref 70–99)

## 2014-05-14 LAB — PROTIME-INR
INR: 0.94 (ref 0.00–1.49)
PROTHROMBIN TIME: 12.6 s (ref 11.6–15.2)

## 2014-05-14 LAB — APTT: APTT: 28 s (ref 24–37)

## 2014-05-14 MED ORDER — MIDAZOLAM HCL 2 MG/2ML IJ SOLN
INTRAMUSCULAR | Status: AC
  Filled 2014-05-14: qty 6

## 2014-05-14 MED ORDER — SODIUM CHLORIDE 0.9 % IV SOLN
INTRAVENOUS | Status: DC
Start: 2014-05-14 — End: 2014-05-15
  Administered 2014-05-14: 10:00:00 via INTRAVENOUS

## 2014-05-14 MED ORDER — LIDOCAINE HCL 1 % IJ SOLN
INTRAMUSCULAR | Status: AC
  Filled 2014-05-14: qty 20

## 2014-05-14 MED ORDER — FENTANYL CITRATE 0.05 MG/ML IJ SOLN
INTRAMUSCULAR | Status: AC
  Filled 2014-05-14: qty 4

## 2014-05-14 MED ORDER — FENTANYL CITRATE 0.05 MG/ML IJ SOLN
INTRAMUSCULAR | Status: AC | PRN
  Administered 2014-05-14 (×4): 25 ug via INTRAVENOUS

## 2014-05-14 MED ORDER — MIDAZOLAM HCL 2 MG/2ML IJ SOLN
INTRAMUSCULAR | Status: AC | PRN
  Administered 2014-05-14 (×4): 0.5 mg via INTRAVENOUS

## 2014-05-14 MED ORDER — IOHEXOL 300 MG/ML  SOLN
120.0000 mL | Freq: Once | INTRAMUSCULAR | Status: AC | PRN
  Administered 2014-05-14: 120 mL via INTRA_ARTERIAL

## 2014-05-14 NOTE — Progress Notes (Signed)
Foley cathether removed, informed pt to push call button when she has the urge to void.  Pt and her husband voiced understanding.

## 2014-05-14 NOTE — Progress Notes (Signed)
Pt was given supper tray and is eating sandwich and chips at this time.

## 2014-05-14 NOTE — Sedation Documentation (Signed)
Gauze/tegaderm bandage applied to L femoral artery.  Level 0, +Doppler DP.

## 2014-05-14 NOTE — Procedures (Signed)
RUE Angiogram No comp

## 2014-05-14 NOTE — Discharge Instructions (Signed)
Remove dressing to left groin in 24 hours, at 1pm 11/12.    Arteriogram Care After These instructions give you information on caring for yourself after your procedure. Your doctor may also give you more specific instructions. Call your doctor if you have any problems or questions after your procedure. HOME CARE  Keep your leg straight for at least 6 hours.  Do not bathe, swim, or use a hot tub until directed by your doctor. You can shower.  Do not lift anything heavier than 10 pounds (about a gallon of milk) for 2 days.  Do not walk a lot, run, or drive for 2 days.  Return to normal activities in 2 days or as told by your doctor. Finding out the results of your test Ask when your test results will be ready. Make sure you get your test results. GET HELP RIGHT AWAY IF:   You have fever.  You have more pain in your leg.  The leg that was cut is:  Bleeding.  Puffy (swollen) or red.  Cold.  Pale or changes color.  Weak.  Tingly or numb. If you go to the Emergency Room, tell your nurse that you have had an arteriogram. Take this paper with you to show the nurse. MAKE SURE YOU:  Understand these instructions.  Will watch your condition.  Will get help right away if you are not doing well or get worse. Document Released: 09/16/2008 Document Revised: 06/25/2013 Document Reviewed: 09/16/2008 [facility] Patient Information 2015 [place], [place]. This information is not intended to replace advice given to you by your health care provider. Make sure you discuss any questions you have with your health care provider. Conscious Sedation, Adult, Care After Refer to this sheet in the next few weeks. These instructions provide you with information on caring for yourself after your procedure. Your health care provider may also give you more specific instructions. Your treatment has been planned according to current medical practices, but problems sometimes occur. Call your health care  provider if you have any problems or questions after your procedure. WHAT TO EXPECT AFTER THE PROCEDURE  After your procedure:  You may feel sleepy, clumsy, and have poor balance for several hours.  Vomiting may occur if you eat too soon after the procedure. HOME CARE INSTRUCTIONS  Do not participate in any activities where you could become injured for at least 24 hours. Do not:  Drive.  Swim.  Ride a bicycle.  Operate heavy machinery.  Cook.  Use power tools.  Climb ladders.  Work from a high place.  Do not make important decisions or sign legal documents until you are improved.  If you vomit, drink water, juice, or soup when you can drink without vomiting. Make sure you have little or no nausea before eating solid foods.  Only take over-the-counter or prescription medicines for pain, discomfort, or fever as directed by your health care provider.  Make sure you and your family fully understand everything about the medicines given to you, including what side effects may occur.  You should not drink alcohol, take sleeping pills, or take medicines that cause drowsiness for at least 24 hours.  If you smoke, do not smoke without supervision.  If you are feeling better, you may resume normal activities 24 hours after you were sedated.  Keep all appointments with your health care provider. SEEK MEDICAL CARE IF:  Your skin is pale or bluish in color.  You continue to feel nauseous or vomit.  Your pain  is getting worse and is not helped by medicine.  You have bleeding or swelling.  You are still sleepy or feeling clumsy after 24 hours. SEEK IMMEDIATE MEDICAL CARE IF:  You develop a rash.  You have difficulty breathing.  You develop any type of allergic problem.  You have a fever. MAKE SURE YOU:  Understand these instructions.  Will watch your condition.  Will get help right away if you are not doing well or get worse. Document Released: 04/10/2013 Document  Reviewed: 04/10/2013 [facility] Patient Information 2015 [place]. This information is not intended to replace advice given to you by your health care provider. Make sure you discuss any questions you have with your health care provider.

## 2014-05-14 NOTE — Sedation Documentation (Signed)
5Fr sheath removed from L femoral artery by Dr. Bonnielee Haff.  Hemostasis achieved using Exoseal device.  Groin level 0, LDP + with Doppler.

## 2014-05-14 NOTE — Progress Notes (Signed)
Explained to pt the need for her to keep her left leg straight, no flexion for 6 hours.  Informed pt to be flat also for 2 hours and then she can come up 30 degrees.  Pt voiced understanding.

## 2014-05-14 NOTE — Progress Notes (Signed)
Raised pt's head 30 degrees, reminded pt to keep left leg straight, no flexion, pt voiced understanding.  Dressing has remained dry and intact to left groin.

## 2014-05-14 NOTE — H&P (Signed)
Chief Complaint: Non healing wound right third finger  Referring Physician(s): Kuzma,Kevin  History of Present Illness: [name] is a 68 y.o. female with history of DM, HTN, CAD, Raynaud's syndrome, PVD (left FPBG 2009, AFBG 2013, right BKA 2013, left CEA 2005), prior tobacco abuse . She presents now with 2 month history of non healing wound of right third finger with occasional paresthesias/pain at site/Janeway lesions and is scheduled today for RUE arteriogram with possible endovascular intervention.                                                                                                                                                                                                                                                                Past Medical History  Diagnosis Date  . Hypertension     Unspecified  . Hyperlipidemia     Mixed  . Tobacco abuse     Remote  . Loose, teeth     has loose bridge and two loose teeth holding it  . PONV (postoperative nausea and vomiting)   . Type II diabetes mellitus   . GERD (gastroesophageal reflux disease)   . Anxiety   . Peripheral vascular disease     unspecified, a. s/p L CEA b. s/p B fem-pop bypass  . Raynaud disease   . Stones in the urinary tract   . UTI (urinary tract infection)   . Anemia     hx  . Coronary artery disease     a. s/p NSTEMI 02/2009 - PCI LCX with Xience DES. Otherwise branch vessel and Dist. RCA dzs. NL EF.   Marland Kitchen Myocardial infarction   . CHF (congestive heart failure)   . Kidney stones   . UTI (lower urinary tract infection)   . Constipation   . Hernia, umbilical   . History of blood transfusion   . Fall from slipping on wet surface Jan. 7, 2014    Right stump bleeding from fall  . Carotid artery occlusion     Past Surgical History  Procedure Laterality Date  . Vesicovaginal fistula closure w/ tah    . Colonoscopy  11/2010  . Upper gastrointestinal endoscopy  11/2010  . Femoral artery  - popliteal artery bypass graft      left  .  Abdominal hysterectomy      complete  . Femoral-popliteal bypass graft  08/22/2011    Procedure: BYPASS GRAFT FEMORAL-POPLITEAL ARTERY;  Surgeon: Curt Jews, MD;  Location: [place];  Service: Vascular;  Laterality: Right;  Thrombectomy and Revision using 38m x 10cm stretch goretex graft  . Intraoperative arteriogram  08/22/2011    Procedure: INTRA OPERATIVE ARTERIOGRAM;  Surgeon: TCurt Jews MD;  [facility];  Service: Vascular;  Laterality: Right;  to lower leg  . Multiple tooth extractions  08-29-2011    5 teeth extracted   . Pci  01/17/12    RLE  . Breast lumpectomy      right  . Tonsillectomy and adenoidectomy    . Coronary angioplasty with stent placement    . Carotid endarterectomy  ~ 2005    left  . Aorta - bilateral femoral artery bypass graft  03/26/2012    Procedure: AORTA BIFEMORAL BYPASS GRAFT;  Surgeon: TRosetta Posner MD;  [facility];  Service: Vascular;  Laterality: N/A;  Aortic-bifemoral bypass using 14x873mHemashield graft .   . Umbilical hernia repair  03/26/2012    Procedure: HERNIA REPAIR UMBILICAL ADULT;  Surgeon: ToRosetta PosnerMD;  [facility];  Service: Vascular;  Laterality: N/A;  Removal of Umbilical hernia sac  . Femoral artery exploration  03/26/2012    Procedure: FEMORAL ARTERY EXPLORATION;  Surgeon: ToRosetta PosnerMD;  [facility];  Service: Vascular;  Laterality: Right;  with Revision of Popliteal-Peroneal bypass graft using 80m51m 10cm thin wall goretex graft  . Amputation  04/23/2012    Procedure: AMPUTATION BELOW KNEE;  Surgeon: TodRosetta PosnerD;  [facility];  Laterality: Right;  . Eye surgery Right Feb. 9, 2015    Cataract  . Eye surgery Left Feb. 16, 2015    Cataract    Allergies: Codeine; Erythromycin; Hydrocodone-acetaminophen; Hydromorphone; Propoxyphene hcl; Statins; Acetaminophen; Ceftriaxone; Eggs or egg-derived products; Ibuprofen; and Shellfish-derived  products  Medications: Prior to Admission medications   Medication Sig Start Date End Date Taking? Authorizing Provider  ALPRAZolam (XANAX) 0.25 MG tablet Take 1 tablet (0.25 mg total) by mouth 3 (three) times daily as needed for anxiety. Patient taking differently: Take 0.25 mg by mouth daily.  05/08/12  Yes Daniel J Angiulli, PA-C  aspirin 325 MG EC tablet Take 325 mg by mouth every morning.    Yes Historical Provider, MD  clopidogrel (PLAVIX) 75 MG tablet Take 75 mg by mouth daily.   Yes Historical Provider, MD  dexlansoprazole (DEXILANT) 60 MG capsule Take 60 mg by mouth 2 (two) times daily.   Yes Historical Provider, MD  furosemide (LASIX) 40 MG tablet Take 1 tablet (40 mg total) by mouth daily. 04/09/12  Yes Regina J Roczniak, PA-C  iron polysaccharides (NIFEREX) 150 MG capsule Take 750 mg by mouth 2 (two) times daily.    Yes Historical Provider, MD  isosorbide mononitrate (IMDUR) 30 MG 24 hr tablet Take 30 mg by mouth 2 (two) times daily.   Yes Historical Provider, MD  metoprolol succinate (TOPROL-XL) 50 MG 24 hr tablet Take 50 mg by mouth 2 (two) times daily. Take with or immediately following a meal.   Yes Historical Provider, MD  nitroGLYCERIN (NITROSTAT) 0.4 MG SL tablet Place 0.4 mg under the tongue every 5 (five) minutes as needed. For chest pain   Yes Historical Provider, MD  omega-3 acid ethyl esters (LOVAZA) 1 G capsule Take 2 g by mouth 2 (two)  times daily.    Yes Historical Provider, MD  pioglitazone-metformin (ACTOPLUS MET) 15-850 MG per tablet Take 1 tablet by mouth daily.   Yes Historical Provider, MD  potassium chloride (K-DUR) 10 MEQ tablet Take 1 tablet (10 mEq total) by mouth 2 (two) times daily. 04/09/12  Yes Regina J Roczniak, PA-C  Probiotic Product (ACIDOPHILUS PEARLS PO) Take 1 capsule by mouth daily.   Yes Historical Provider, MD  ramipril (ALTACE) 1.25 MG capsule Take 1 capsule (1.25 mg total) by mouth 2 (two) times daily. Patient taking differently: Take 1.25 mg by  mouth daily.  05/08/12  Yes Daniel J Angiulli, PA-C  rosuvastatin (CRESTOR) 10 MG tablet Take 10 mg by mouth every 7 (seven) days. Takes on Monday's   Yes Historical Provider, MD  sitaGLIPtan (JANUVIA) 100 MG tablet Take 100 mg by mouth at bedtime.    Yes Historical Provider, MD  solifenacin (VESICARE) 5 MG tablet Take 5 mg by mouth every morning.   Yes Historical Provider, MD  Vitamin D, Ergocalciferol, (DRISDOL) 50000 UNITS CAPS Take 1 capsule (50,000 Units total) by mouth every 7 (seven) days. Patient taking differently: Take 50,000 Units by mouth every Monday, Wednesday, and Friday.  05/08/12  Yes Daniel J Angiulli, PA-C  BESIVANCE 0.6 % SUSP  08/19/13   Historical Provider, MD  calcium carbonate (TUMS) 500 MG chewable tablet Chew 1-4 tablets by mouth daily as needed. For acid reflux.    Historical Provider, MD  Cholecalciferol 25000 UNITS CAPS Take 25,000 Units by mouth 3 (three) times a week. Monday, Wednesday, friday    Historical Provider, MD  ciprofloxacin (CIPRO) 500 MG tablet Take 500 mg by mouth 2 (two) times daily.    Historical Provider, MD  DUREZOL 0.05 % EMUL Apply topically 2 (two) times daily.  08/19/13   Historical Provider, MD  glyBURIDE (DIABETA) 2.5 MG tablet  04/14/12   Historical Provider, MD  isosorbide mononitrate (IMDUR) 60 MG 24 hr tablet Take 1 tablet (60 mg total) by mouth daily. 04/09/12   Nancy Nordmann Roczniak, PA-C  KLOR-CON M10 10 MEQ tablet  07/02/12   Historical Provider, MD  meperidine (DEMEROL) 50 MG tablet Take 1 tablet (50 mg total) by mouth every 6 (six) hours as needed. For pain 07/24/12   Rosetta Posner, MD  metFORMIN (GLUMETZA) 1000 MG (MOD) 24 hr tablet Take 1,000 mg by mouth daily with breakfast.    Historical Provider, MD  metoprolol (LOPRESSOR) 50 MG tablet Take 1 tablet (50 mg total) by mouth 2 (two) times daily. 04/09/12   Regina J Roczniak, PA-C  metoprolol succinate (TOPROL-XL) 25 MG 24 hr tablet  06/18/12   Historical Provider, MD  pioglitazone-metformin  (ACTOPLUS MET) 15-500 MG per tablet Take 1 tablet by mouth daily. Brand name only-does not work if components are given separately per patient 15-850 MG    Historical Provider, MD  PLAVIX 75 MG tablet TAKE 1 TABLET BY MOUTH ONCE DAILY WITH A MEAL 10/25/13   Sherren Mocha, MD  prochlorperazine (COMPAZINE) 10 MG tablet Take 1 tablet (10 mg total) by mouth every 6 (six) hours as needed. For nausea 07/24/12   Rosetta Posner, MD  SANTYL ointment  10/15/13   Historical Provider, MD  sulfamethoxazole-trimethoprim (BACTRIM DS) 800-160 MG per tablet Take 1 tablet by mouth 2 (two) times daily. 07/02/12   Historical Provider, MD    Family History  Problem Relation Age of Onset  . Heart attack Mother   . Heart attack Father   .  Hypertension Brother   . Diabetes Brother   . Hypertension Brother     History   Social History  . Marital Status: Married    Spouse Name: N/A    Number of Children: N/A  . Years of Education: N/A   Social History Main Topics  . Smoking status: Former Smoker -- 2.00 packs/day for 39 years    Types: Cigarettes    Quit date: 07/05/2001  . Smokeless tobacco: Never Used  . Alcohol Use: 7.0 oz/week    14 drink(s) per week  . Drug Use: No  . Sexual Activity: Not on file   Other Topics Concern  . Not on file   Social History Narrative   Does not get regular exercise         Review of Systems  Constitutional: Negative for fever and chills.  HENT: Positive for congestion.   Respiratory: Negative for cough and shortness of breath.   Cardiovascular: Negative for chest pain.  Gastrointestinal: Negative for nausea, vomiting, abdominal pain and blood in stool.  Genitourinary: Positive for urgency and frequency. Negative for dysuria and hematuria.       Hx chronic UTI's  Musculoskeletal: Negative for back pain.  Neurological: Negative for headaches.  Hematological: Does not bruise/bleed easily.    Vital Signs: BP 148/42 mmHg  Pulse 73  Temp(Src) 98 F (36.7 C)  (Oral)  Resp 16  SpO2 87%  Physical Exam  Constitutional: She is oriented to person, place, and time. She appears well-developed and well-nourished.  Cardiovascular: Normal rate and regular rhythm.   L/R fem pulses intact, nonpalpable distal LLE pulses; rt/left radial /ulnar pulses +/-; rt hand cooler than left; motor /sens fxn intact  Pulmonary/Chest: Effort normal and breath sounds normal.  Abdominal: Soft. Bowel sounds are normal. There is no tenderness.  Musculoskeletal:  Rt BKA  Neurological: She is alert and oriented to person, place, and time.    Imaging: No results found.  Labs: Results for orders placed or performed during the hospital encounter of 05/14/14  APTT  Result Value Ref Range   aPTT 28 24 - 37 seconds  CBC with Differential  Result Value Ref Range   WBC 8.8 4.0 - 10.5 K/uL   RBC 3.83 (L) 3.87 - 5.11 MIL/uL   Hemoglobin 9.1 (L) 12.0 - 15.0 g/dL   HCT 29.5 (L) 36.0 - 46.0 %   MCV 77.0 (L) 78.0 - 100.0 fL   MCH 23.8 (L) 26.0 - 34.0 pg   MCHC 30.8 30.0 - 36.0 g/dL   RDW 21.2 (H) 11.5 - 15.5 %   Platelets 422 (H) 150 - 400 K/uL   Neutrophils Relative % PENDING 43 - 77 %   Neutro Abs PENDING 1.7 - 7.7 K/uL   Band Neutrophils PENDING 0 - 10 %   Lymphocytes Relative PENDING 12 - 46 %   Lymphs Abs PENDING 0.7 - 4.0 K/uL   Monocytes Relative PENDING 3 - 12 %   Monocytes Absolute PENDING 0.1 - 1.0 K/uL   Eosinophils Relative PENDING 0 - 5 %   Eosinophils Absolute PENDING 0.0 - 0.7 K/uL   Basophils Relative PENDING 0 - 1 %   Basophils Absolute PENDING 0.0 - 0.1 K/uL   WBC Morphology PENDING    RBC Morphology PENDING    Smear Review PENDING    nRBC PENDING 0 /100 WBC   Metamyelocytes Relative PENDING %   Myelocytes PENDING %   Promyelocytes Absolute PENDING %   Blasts PENDING %  Protime-INR  Result Value Ref Range   Prothrombin Time 12.6 11.6 - 15.2 seconds   INR 0.94 0.00 - 1.49  \BMP pending    Assessment and Plan: [name] is a 68 y.o.  female with history of DM, HTN, CAD, Raynaud's syndrome, PVD (left FPBG 2009, AFBG 2013, right BKA 2013, left CEA 2005), prior tobacco abuse . She presents now with 2 month history of non healing wound of right third finger with occasional paresthesias/pain at site/Janeway lesions and is scheduled today for RUE arteriogram with possible endovascular intervention. Details/risks of procedure d/w pt/husband with their understanding and consent.         Signed: Autumn Messing 05/14/2014, 10:31 AM

## 2014-05-16 ENCOUNTER — Ambulatory Visit (INDEPENDENT_AMBULATORY_CARE_PROVIDER_SITE_OTHER): Payer: Medicare Other | Admitting: Cardiovascular Disease

## 2014-05-16 ENCOUNTER — Encounter: Payer: Self-pay | Admitting: Cardiovascular Disease

## 2014-05-16 VITALS — BP 118/60 | HR 82 | Ht 64.0 in | Wt 139.8 lb

## 2014-05-16 DIAGNOSIS — I2510 Atherosclerotic heart disease of native coronary artery without angina pectoris: Secondary | ICD-10-CM

## 2014-05-16 DIAGNOSIS — E785 Hyperlipidemia, unspecified: Secondary | ICD-10-CM

## 2014-05-16 DIAGNOSIS — I6529 Occlusion and stenosis of unspecified carotid artery: Secondary | ICD-10-CM

## 2014-05-16 MED ORDER — ROSUVASTATIN CALCIUM 10 MG PO TABS: 10.0000 mg | ORAL_TABLET | ORAL | Status: DC

## 2014-05-16 MED ORDER — EZETIMIBE 10 MG PO TABS: 10.0000 mg | ORAL_TABLET | Freq: Every day | ORAL | Status: DC

## 2014-05-16 MED ORDER — NITROGLYCERIN 0.4 MG SL SUBL: 0.4000 mg | SUBLINGUAL_TABLET | SUBLINGUAL | Status: AC | PRN

## 2014-05-16 NOTE — Progress Notes (Signed)
Background: the patient is followed for coronary artery disease. She also has extensive vascular disease. She has undergone left femoral popliteal bypass grafting in 2009, aortobifemoral bypass grafting in 2013, and right below-knee amputation in 2013. She had a left carotid endarterectomy in 2005. From a coronary perspective, she has undergone stenting of the left circumflex at the time of a non-ST elevation MI. Her most recent heart catheterization in 2013 demonstrated continued patency of the left circumflex with mild nonobstructive disease in the LAD and normal LV function.  HPI:  68 year old woman presenting for follow-up evaluation. She was last seen 1 year ago. She continues to have vascular problems. She recently has developed a nonhealing wound on her right hand. An angiogram has demonstrated extensive distal vessel disease with radial artery occlusion and probable distal occlusion of the ulnar artery.   From a cardiac perspective she is doing ok. She denies chest pain or pressure, shortness of breath, or edema. No lightheadedness or syncope. Continue to be compliant with medical therapy. She has done very little walking and is using a wheelchair most of the time.   Studies:   Outpatient Encounter Prescriptions as of 05/16/2014  Medication Sig  . ALPRAZolam (XANAX) 0.25 MG tablet Take 1 tablet (0.25 mg total) by mouth 3 (three) times daily as needed for anxiety. (Patient taking differently: Take 0.25 mg by mouth daily. )  . aspirin 325 MG EC tablet Take 325 mg by mouth every morning.   Marland Kitchen BESIVANCE 0.6 % SUSP   . calcium carbonate (TUMS) 500 MG chewable tablet Chew 1-4 tablets by mouth daily as needed. For acid reflux.  . Cholecalciferol 25000 UNITS CAPS Take 25,000 Units by mouth 3 (three) times a week. Monday, Wednesday, friday  . clopidogrel (PLAVIX) 75 MG tablet Take 75 mg by mouth daily.  Marland Kitchen dexlansoprazole (DEXILANT) 60 MG capsule Take 60 mg by mouth 2 (two) times daily.  . DUREZOL  0.05 % EMUL Apply topically 2 (two) times daily.   . fluconazole (DIFLUCAN) 150 MG tablet   . furosemide (LASIX) 40 MG tablet Take 1 tablet (40 mg total) by mouth daily.  Marland Kitchen glyBURIDE (DIABETA) 2.5 MG tablet   . Influenza vac split quadrivalent PF (FLUARIX) 0.5 ML injection Inject 0.5 mLs into the muscle once.  . iron polysaccharides (NIFEREX) 150 MG capsule Take 750 mg by mouth 2 (two) times daily.   Marland Kitchen KLOR-CON M10 10 MEQ tablet   . meperidine (DEMEROL) 50 MG tablet Take 1 tablet (50 mg total) by mouth every 6 (six) hours as needed. For pain  . metFORMIN (GLUMETZA) 1000 MG (MOD) 24 hr tablet Take 1,000 mg by mouth daily with breakfast.  . metoprolol succinate (TOPROL-XL) 50 MG 24 hr tablet Take 50 mg by mouth 2 (two) times daily. Take with or immediately following a meal.  . mupirocin cream (BACTROBAN) 2 %   . nitroGLYCERIN (NITROSTAT) 0.4 MG SL tablet Place 0.4 mg under the tongue every 5 (five) minutes as needed. For chest pain  . omega-3 acid ethyl esters (LOVAZA) 1 G capsule Take 2 g by mouth 2 (two) times daily.   . pioglitazone-metformin (ACTOPLUS MET) 15-500 MG per tablet Take 1 tablet by mouth daily. Brand name only-does not work if components are given separately per patient 15-850 MG  . pioglitazone-metformin (ACTOPLUS MET) 15-850 MG per tablet Take 1 tablet by mouth daily.  Marland Kitchen PLAVIX 75 MG tablet TAKE 1 TABLET BY MOUTH ONCE DAILY WITH A MEAL  . potassium chloride (K-DUR)  10 MEQ tablet Take 1 tablet (10 mEq total) by mouth 2 (two) times daily.  . Probiotic Product (ACIDOPHILUS PEARLS PO) Take 1 capsule by mouth daily.  . prochlorperazine (COMPAZINE) 10 MG tablet Take 1 tablet (10 mg total) by mouth every 6 (six) hours as needed. For nausea  . promethazine (PHENERGAN) 25 MG tablet   . Propylene Glycol (SYSTANE BALANCE) 0.6 % SOLN Place 1 drop into the right eye as needed (for irritation).  . ramipril (ALTACE) 1.25 MG capsule Take 1 capsule (1.25 mg total) by mouth 2 (two) times daily.  (Patient taking differently: Take 1.25 mg by mouth daily. )  . rosuvastatin (CRESTOR) 10 MG tablet Take 10 mg by mouth every 7 (seven) days. Takes on Monday's  . SANTYL ointment Apply 1 application topically daily.   . sitaGLIPtan (JANUVIA) 100 MG tablet Take 100 mg by mouth at bedtime.   . solifenacin (VESICARE) 5 MG tablet Take 5 mg by mouth every morning.  . Vitamin D, Ergocalciferol, (DRISDOL) 50000 UNITS CAPS Take 1 capsule (50,000 Units total) by mouth every 7 (seven) days. (Patient taking differently: Take 50,000 Units by mouth every Monday, Wednesday, and Friday. )  . isosorbide mononitrate (IMDUR) 30 MG 24 hr tablet Take 30 mg by mouth 2 (two) times daily.  . isosorbide mononitrate (IMDUR) 60 MG 24 hr tablet Take 1 tablet (60 mg total) by mouth daily.  . [DISCONTINUED] ciprofloxacin (CIPRO) 500 MG tablet Take 500 mg by mouth 2 (two) times daily.  . [DISCONTINUED] doxycycline (ADOXA) 100 MG tablet   . [DISCONTINUED] metoprolol (LOPRESSOR) 50 MG tablet Take 1 tablet (50 mg total) by mouth 2 (two) times daily.  . [DISCONTINUED] metoprolol succinate (TOPROL-XL) 25 MG 24 hr tablet   . [DISCONTINUED] sulfamethoxazole-trimethoprim (BACTRIM DS) 800-160 MG per tablet Take 1 tablet by mouth 2 (two) times daily.    Allergies  Allergen Reactions  . Codeine Nausea And Vomiting  . Erythromycin Nausea And Vomiting  . Hydrocodone-Acetaminophen Nausea And Vomiting  . Hydromorphone Nausea And Vomiting  . Propoxyphene Hcl Nausea And Vomiting  . Statins Other (See Comments)    Leg cramps  . Acetaminophen Other (See Comments)    Does not tolerate well, nausea  . Ceftriaxone Itching  . Eggs Or Egg-Derived Products Other (See Comments)    "pt does not eat"  . Ibuprofen Other (See Comments)    Does not tolerate well  . Shellfish-Derived Products     "pt does not eat"    Past Medical History  Diagnosis Date  . Hypertension     Unspecified  . Hyperlipidemia     Mixed  . Tobacco abuse      Remote  . Loose, teeth     has loose bridge and two loose teeth holding it  . PONV (postoperative nausea and vomiting)   . Type II diabetes mellitus   . GERD (gastroesophageal reflux disease)   . Anxiety   . Peripheral vascular disease     unspecified, a. s/p L CEA b. s/p B fem-pop bypass  . Raynaud disease   . Stones in the urinary tract   . UTI (urinary tract infection)   . Anemia     hx  . Coronary artery disease     a. s/p NSTEMI 02/2009 - PCI LCX with Xience DES. Otherwise branch vessel and Dist. RCA dzs. NL EF.   Marland Kitchen Myocardial infarction   . CHF (congestive heart failure)   . Kidney stones   . UTI (lower  urinary tract infection)   . Constipation   . Hernia, umbilical   . History of blood transfusion   . Fall from slipping on wet surface Jan. 7, 2014    Right stump bleeding from fall  . Carotid artery occlusion     family history includes Diabetes in her brother; Heart attack in her father and mother; Hypertension in her brother and brother.   ROS: Negative except as per HPI  BP 118/60 mmHg  Pulse 82  Ht _0  (1.626 m)  Wt 139 lb 12.8 oz (63.413 kg)  BMI 23.98 kg/m2  PHYSICAL EXAM: Pt is alert and oriented, NAD HEENT: normal Neck: JVP - normal, carotids 2+= with bilateral bruits Lungs: CTA bilaterally CV: RRR with 2/6 systolic murmur at the RUSB Abd: soft, NT, Positive BS, no hepatomegaly Ext: no C/C/E, right BKA  EKG:  Normal sinus rhythm 82 bpm, within normal limits.  ASSESSMENT AND PLAN: 1. Coronary artery disease, native vessel, without symptoms of angina. Continue medical therapy.  2. Essential hypertension. BP controlled  3. Chronic diastolic heart failure. No symptoms, likely secondary to limited activity level.  4. Peripheral arterial disease, extensive. Followed by Dr Donnetta Hutching.  5. Hyperlipidemia. Statin intolerant. Taking Crestor once weekly. Discussed consideration of a PCSK9 inhibitor. I think she is an excellent candidate who desperately needs  aggressive secondary risk reduction in the setting of progressive vascular disease. Discussed with Elberta Leatherwood, PharmD. Will start her on Zetia 10 mg daily in addition to crestor, check lipids in 4-6 weeks, and follow-up in [facility] to arrange drug therapy with a PCSK9 if she qualifies. Also discussed plan with Dr Forde Dandy who agrees.  Sherren Mocha, MD 05/16/2014 12:06 PM

## 2014-05-16 NOTE — Patient Instructions (Signed)
Your physician has recommended you make the following change in your medication: START Zetia 10mg  take one by mouth daily  Your physician recommends that you return for a FASTING LIPID and LIVER in 6 WEEKS--nothing to eat or drink after midnight, lab opens at 7:30 AM  You have been referred to [facility] in 7 WEEKS to follow-up on lab results and discuss PCSK9 inhibitor  Your physician wants you to follow-up in: 1 YEAR with Dr Excell Seltzer.  You will receive a reminder letter in the mail two months in advance. If you don't receive a letter, please call our office to schedule the follow-up appointment.

## 2014-05-25 ENCOUNTER — Other Ambulatory Visit: Payer: Self-pay | Admitting: Cardiovascular Disease

## 2014-06-12 ENCOUNTER — Encounter (HOSPITAL_COMMUNITY): Payer: Self-pay | Admitting: Surgery

## 2014-06-12 ENCOUNTER — Other Ambulatory Visit (HOSPITAL_COMMUNITY): Payer: Self-pay | Admitting: Urology

## 2014-06-12 DIAGNOSIS — Q6239 Other obstructive defects of renal pelvis and ureter: Secondary | ICD-10-CM

## 2014-06-12 DIAGNOSIS — Q6211 Congenital occlusion of ureteropelvic junction: Principal | ICD-10-CM

## 2014-06-17 ENCOUNTER — Encounter (HOSPITAL_BASED_OUTPATIENT_CLINIC_OR_DEPARTMENT_OTHER): Payer: Medicare Other | Attending: General Surgery

## 2014-06-17 DIAGNOSIS — I739 Peripheral vascular disease, unspecified: Secondary | ICD-10-CM | POA: Diagnosis not present

## 2014-06-17 DIAGNOSIS — I7090 Unspecified atherosclerosis: Secondary | ICD-10-CM | POA: Diagnosis not present

## 2014-06-17 DIAGNOSIS — L97529 Non-pressure chronic ulcer of other part of left foot with unspecified severity: Secondary | ICD-10-CM | POA: Diagnosis not present

## 2014-06-17 DIAGNOSIS — E11621 Type 2 diabetes mellitus with foot ulcer: Secondary | ICD-10-CM | POA: Diagnosis not present

## 2014-06-17 DIAGNOSIS — L97519 Non-pressure chronic ulcer of other part of right foot with unspecified severity: Secondary | ICD-10-CM | POA: Insufficient documentation

## 2014-06-20 ENCOUNTER — Ambulatory Visit (HOSPITAL_COMMUNITY)
Admission: RE | Admit: 2014-06-20 | Discharge: 2014-06-20 | Disposition: A | Discharge status: Home / Self Care | Payer: Medicare Other | Source: Ambulatory Visit | Attending: Urology | Admitting: Urology

## 2014-06-20 DIAGNOSIS — Q6211 Congenital occlusion of ureteropelvic junction: Secondary | ICD-10-CM | POA: Insufficient documentation

## 2014-06-20 DIAGNOSIS — Q6239 Other obstructive defects of renal pelvis and ureter: Secondary | ICD-10-CM

## 2014-06-20 MED ORDER — FUROSEMIDE 10 MG/ML IJ SOLN
40.0000 mg | Freq: Once | INTRAMUSCULAR | Status: AC
  Administered 2014-06-20: 32 mg via INTRAVENOUS
  Filled 2014-06-20: qty 4

## 2014-06-20 MED ORDER — TECHNETIUM TC 99M MERTIATIDE
14.8000 | Freq: Once | INTRAVENOUS | Status: AC | PRN
  Administered 2014-06-20: 15 via INTRAVENOUS

## 2014-06-30 ENCOUNTER — Other Ambulatory Visit: Payer: Medicare Other

## 2014-07-01 ENCOUNTER — Encounter: Payer: Self-pay | Admitting: Vascular Surgery

## 2014-07-01 ENCOUNTER — Other Ambulatory Visit (INDEPENDENT_AMBULATORY_CARE_PROVIDER_SITE_OTHER): Payer: Medicare Other | Admitting: *Deleted

## 2014-07-01 DIAGNOSIS — E785 Hyperlipidemia, unspecified: Secondary | ICD-10-CM

## 2014-07-01 DIAGNOSIS — I2510 Atherosclerotic heart disease of native coronary artery without angina pectoris: Secondary | ICD-10-CM

## 2014-07-01 LAB — LIPID PANEL
CHOL/HDL RATIO: 3
Cholesterol: 163 mg/dL (ref 0–200)
HDL: 57.7 mg/dL (ref >39.00)
LDL CALC: 76 mg/dL (ref 0–99)
NONHDL: 105.3
TRIGLYCERIDES: 148 mg/dL (ref 0.0–149.0)
VLDL: 29.6 mg/dL (ref 0.0–40.0)

## 2014-07-01 LAB — HEPATIC FUNCTION PANEL
ALT: 7 U/L (ref 0–35)
AST: 9 U/L (ref 0–37)
Albumin: 3.4 g/dL — ABNORMAL LOW (ref 3.5–5.2)
Alkaline Phosphatase: 75 U/L (ref 39–117)
BILIRUBIN DIRECT: 0 mg/dL (ref 0.0–0.3)
BILIRUBIN TOTAL: 0.3 mg/dL (ref 0.2–1.2)
Total Protein: 6.7 g/dL (ref 6.0–8.3)

## 2014-07-08 ENCOUNTER — Encounter: Payer: Self-pay | Admitting: Vascular Surgery

## 2014-07-08 ENCOUNTER — Ambulatory Visit (INDEPENDENT_AMBULATORY_CARE_PROVIDER_SITE_OTHER): Payer: Medicare Other | Admitting: Vascular Surgery

## 2014-07-08 ENCOUNTER — Ambulatory Visit (INDEPENDENT_AMBULATORY_CARE_PROVIDER_SITE_OTHER)
Admission: RE | Admit: 2014-07-08 | Discharge: 2014-07-08 | Disposition: A | Discharge status: Home / Self Care | Payer: Medicare Other | Source: Ambulatory Visit | Attending: Family | Admitting: Family

## 2014-07-08 ENCOUNTER — Ambulatory Visit: Payer: Medicare Other | Admitting: Pharmacist

## 2014-07-08 ENCOUNTER — Ambulatory Visit (HOSPITAL_COMMUNITY)
Admission: RE | Admit: 2014-07-08 | Discharge: 2014-07-08 | Disposition: A | Discharge status: Home / Self Care | Payer: Medicare Other | Source: Ambulatory Visit | Attending: Vascular Surgery | Admitting: Vascular Surgery

## 2014-07-08 VITALS — BP 137/72 | HR 79 | Resp 16 | Ht 64.0 in | Wt 134.0 lb

## 2014-07-08 DIAGNOSIS — Z48812 Encounter for surgical aftercare following surgery on the circulatory system: Secondary | ICD-10-CM

## 2014-07-08 DIAGNOSIS — I739 Peripheral vascular disease, unspecified: Secondary | ICD-10-CM | POA: Diagnosis not present

## 2014-07-08 DIAGNOSIS — I6523 Occlusion and stenosis of bilateral carotid arteries: Secondary | ICD-10-CM

## 2014-07-08 NOTE — Addendum Note (Signed)
Addended by: Sharee Pimple on: 07/08/2014 05:10 PM   Modules accepted: Orders

## 2014-07-08 NOTE — Progress Notes (Signed)
Here today for continued follow-up of her diffuse peripheral vascular occlusive disease. Her main problem currently is ulceration and nonhealing on the tip of her right third finger. She underwent normal arch arteriogram on 05/15/2014 showing occlusion of her radial artery at its origin and ulnar artery occluded at the wrist. She does have collateral filling of palmar arch. She does have near total healing of the great toe on her left foot with a very small ulceration over the tip. Minimal discomfort with this. She is a wheelchair today and reports that she is not walking as much as she had been on her BK prosthesis. She has seen the orthopedic hand specialist in [place] had nothing to offer. She is seeing subspecialist in [place] later this week regarding her right third finger  Past Medical History  Diagnosis Date  . Hypertension     Unspecified  . Hyperlipidemia     Mixed  . Tobacco abuse     Remote  . Loose, teeth     has loose bridge and two loose teeth holding it  . PONV (postoperative nausea and vomiting)   . Type II diabetes mellitus   . GERD (gastroesophageal reflux disease)   . Anxiety   . Peripheral vascular disease     unspecified, a. s/p L CEA b. s/p B fem-pop bypass  . Raynaud disease   . Stones in the urinary tract   . UTI (urinary tract infection)   . Anemia     hx  . Coronary artery disease     a. s/p NSTEMI 02/2009 - PCI LCX with Xience DES. Otherwise branch vessel and Dist. RCA dzs. NL EF.   Marland Kitchen Myocardial infarction   . CHF (congestive heart failure)   . Kidney stones   . UTI (lower urinary tract infection)   . Constipation   . Hernia, umbilical   . History of blood transfusion   . Fall from slipping on wet surface Jan. 7, 2014    Right stump bleeding from fall  . Carotid artery occlusion     History  Substance Use Topics  . Smoking status: Former Smoker -- 2.00 packs/day for 39 years    Types: Cigarettes    Quit date: 07/05/2001  . Smokeless  tobacco: Never Used  . Alcohol Use: 8.4 oz/week    14 Not specified per week    Family History  Problem Relation Age of Onset  . Heart attack Mother   . Heart attack Father   . Hypertension Brother   . Diabetes Brother   . Hypertension Brother     Allergies  Allergen Reactions  . Codeine Nausea And Vomiting  . Erythromycin Nausea And Vomiting  . Hydrocodone-Acetaminophen Nausea And Vomiting  . Hydromorphone Nausea And Vomiting  . Propoxyphene Hcl Nausea And Vomiting  . Statins Other (See Comments)    Leg cramps  . Acetaminophen Other (See Comments)    Does not tolerate well, nausea  . Ceftriaxone Itching  . Eggs Or Egg-Derived Products Other (See Comments)    "pt does not eat"  . Ibuprofen Other (See Comments)    Does not tolerate well  . Shellfish-Derived Products     "pt does not eat"    Current outpatient prescriptions: ALPRAZolam (XANAX) 0.25 MG tablet, Take 1 tablet (0.25 mg total) by mouth 3 (three) times daily as needed for anxiety. (Patient taking differently: Take 0.25 mg by mouth daily. ), Disp: 60 tablet, Rfl: 0;  aspirin 325 MG EC tablet, Take 325 mg  by mouth every morning. , Disp: , Rfl: ;  calcium carbonate (TUMS) 500 MG chewable tablet, Chew 1-4 tablets by mouth daily as needed. For acid reflux., Disp: , Rfl:  Cholecalciferol 25000 UNITS CAPS, Take 25,000 Units by mouth 3 (three) times a week. Monday, Wednesday, friday, Disp: , Rfl: ;  dexlansoprazole (DEXILANT) 60 MG capsule, Take 60 mg by mouth 2 (two) times daily., Disp: , Rfl: ;  DUREZOL 0.05 % EMUL, Apply topically 2 (two) times daily. , Disp: , Rfl: ;  ezetimibe (ZETIA) 10 MG tablet, Take 1 tablet (10 mg total) by mouth daily., Disp: 90 tablet, Rfl: 3 fluconazole (DIFLUCAN) 150 MG tablet, , Disp: , Rfl: 0;  furosemide (LASIX) 40 MG tablet, Take 1 tablet (40 mg total) by mouth daily., Disp: 30 tablet, Rfl: 1;  iron polysaccharides (NIFEREX) 150 MG capsule, Take 750 mg by mouth 2 (two) times daily. , Disp: ,  Rfl: ;  isosorbide mononitrate (IMDUR) 30 MG 24 hr tablet, Take 30 mg by mouth 2 (two) times daily., Disp: , Rfl:  isosorbide mononitrate (IMDUR) 60 MG 24 hr tablet, Take 1 tablet (60 mg total) by mouth daily., Disp: 30 tablet, Rfl: 1;  meperidine (DEMEROL) 50 MG tablet, Take 1 tablet (50 mg total) by mouth every 6 (six) hours as needed. For pain, Disp: 30 tablet, Rfl: 0;  metoprolol succinate (TOPROL-XL) 50 MG 24 hr tablet, Take 50 mg by mouth 2 (two) times daily. Take with or immediately following a meal., Disp: , Rfl:  nitroGLYCERIN (NITROSTAT) 0.4 MG SL tablet, Place 1 tablet (0.4 mg total) under the tongue every 5 (five) minutes as needed. For chest pain, Disp: 25 tablet, Rfl: 2;  omega-3 acid ethyl esters (LOVAZA) 1 G capsule, Take 2 g by mouth 2 (two) times daily. , Disp: , Rfl: ;  pioglitazone-metformin (ACTOPLUS MET) 15-850 MG per tablet, Take 1 tablet by mouth daily., Disp: , Rfl:  PLAVIX 75 MG tablet, TAKE 1 TABLET BY MOUTH ONCE DAILY WITH A MEAL, Disp: 30 tablet, Rfl: 11;  potassium chloride (K-DUR) 10 MEQ tablet, Take 1 tablet (10 mEq total) by mouth 2 (two) times daily., Disp: 60 tablet, Rfl: 1;  Probiotic Product (ACIDOPHILUS PEARLS PO), Take 1 capsule by mouth daily., Disp: , Rfl:  prochlorperazine (COMPAZINE) 10 MG tablet, Take 1 tablet (10 mg total) by mouth every 6 (six) hours as needed. For nausea, Disp: 30 tablet, Rfl: 2;  promethazine (PHENERGAN) 25 MG tablet, , Disp: , Rfl: 0;  Propylene Glycol (SYSTANE BALANCE) 0.6 % SOLN, Place 1 drop into the right eye as needed (for irritation)., Disp: , Rfl:  ramipril (ALTACE) 1.25 MG capsule, Take 1 capsule (1.25 mg total) by mouth 2 (two) times daily. (Patient taking differently: Take 1.25 mg by mouth daily. ), Disp: 60 capsule, Rfl: 1;  rosuvastatin (CRESTOR) 10 MG tablet, Take 1 tablet (10 mg total) by mouth every 7 (seven) days. Takes on Monday's, Disp: 30 tablet, Rfl: 3;  SANTYL ointment, Apply 1 application topically daily. , Disp: , Rfl:   sitaGLIPtan (JANUVIA) 100 MG tablet, Take 100 mg by mouth at bedtime. , Disp: , Rfl: ;  Vitamin D, Ergocalciferol, (DRISDOL) 50000 UNITS CAPS, Take 1 capsule (50,000 Units total) by mouth every 7 (seven) days. (Patient taking differently: Take 50,000 Units by mouth every Monday, Wednesday, and Friday. ), Disp: 30 capsule, Rfl: 1;  BESIVANCE 0.6 % SUSP, , Disp: , Rfl: ;  glyBURIDE (DIABETA) 2.5 MG tablet, , Disp: , Rfl:  Influenza vac  split quadrivalent PF (FLUARIX) 0.5 ML injection, Inject 0.5 mLs into the muscle once., Disp: , Rfl: ;  KLOR-CON M10 10 MEQ tablet, , Disp: , Rfl: ;  metFORMIN (GLUMETZA) 1000 MG (MOD) 24 hr tablet, Take 1,000 mg by mouth daily with breakfast., Disp: , Rfl: ;  mupirocin cream (BACTROBAN) 2 %, , Disp: , Rfl: 0 pioglitazone-metformin (ACTOPLUS MET) 15-500 MG per tablet, Take 1 tablet by mouth daily. Brand name only-does not work if components are given separately per patient 15-850 MG, Disp: , Rfl: ;  solifenacin (VESICARE) 5 MG tablet, Take 5 mg by mouth every morning., Disp: , Rfl:   BP 137/72 mmHg  Pulse 79  Resp 16  Ht 5' 4"  (1.626 m)  Wt 134 lb (60.782 kg)  BMI 22.99 kg/m2  Body mass index is 22.99 kg/(m^2).       On physical exam she does have a well-perfused hand but does have swelling and erythema and ulceration over the tip of her third finger. She does have good Doppler flow at the ulnar and radial artery at the wrist and also flow in the palmar arch.  Her left foot exam does reveal a good color and good temperature. She does have a peroneal artery is the best Doppler flow.  Noninvasive studies today reveal patency of her left femoropopliteal bypass with the known stenosis of her popliteal artery below the distal anastomosis. Ankle arm indexes 0.79  Impression and plan: Diffuse peripheral vascular occlusive disease. She is having maximal medical treatment. I do not recommend any further intervention at this time. We'll see her in 6 months with repeat  noninvasive studies. Explained I have nothing to add regarding treatment for her right hand

## 2014-07-22 ENCOUNTER — Encounter (HOSPITAL_BASED_OUTPATIENT_CLINIC_OR_DEPARTMENT_OTHER): Payer: Medicare Other | Attending: General Surgery

## 2014-07-22 DIAGNOSIS — E11621 Type 2 diabetes mellitus with foot ulcer: Secondary | ICD-10-CM | POA: Insufficient documentation

## 2014-07-22 DIAGNOSIS — L98499 Non-pressure chronic ulcer of skin of other sites with unspecified severity: Secondary | ICD-10-CM | POA: Diagnosis not present

## 2014-07-22 DIAGNOSIS — L97529 Non-pressure chronic ulcer of other part of left foot with unspecified severity: Secondary | ICD-10-CM | POA: Insufficient documentation

## 2014-07-22 DIAGNOSIS — E11622 Type 2 diabetes mellitus with other skin ulcer: Secondary | ICD-10-CM | POA: Insufficient documentation

## 2014-08-02 IMAGING — CR DG ABD PORTABLE 1V
1 series · 1 of 1 positions shown · non-contrast
Comparison: CT 02/03/1812.

CLINICAL DATA: 66-year-old male status post stent placement.

PORTABLE ABDOMEN - 1 VIEW

[AP]
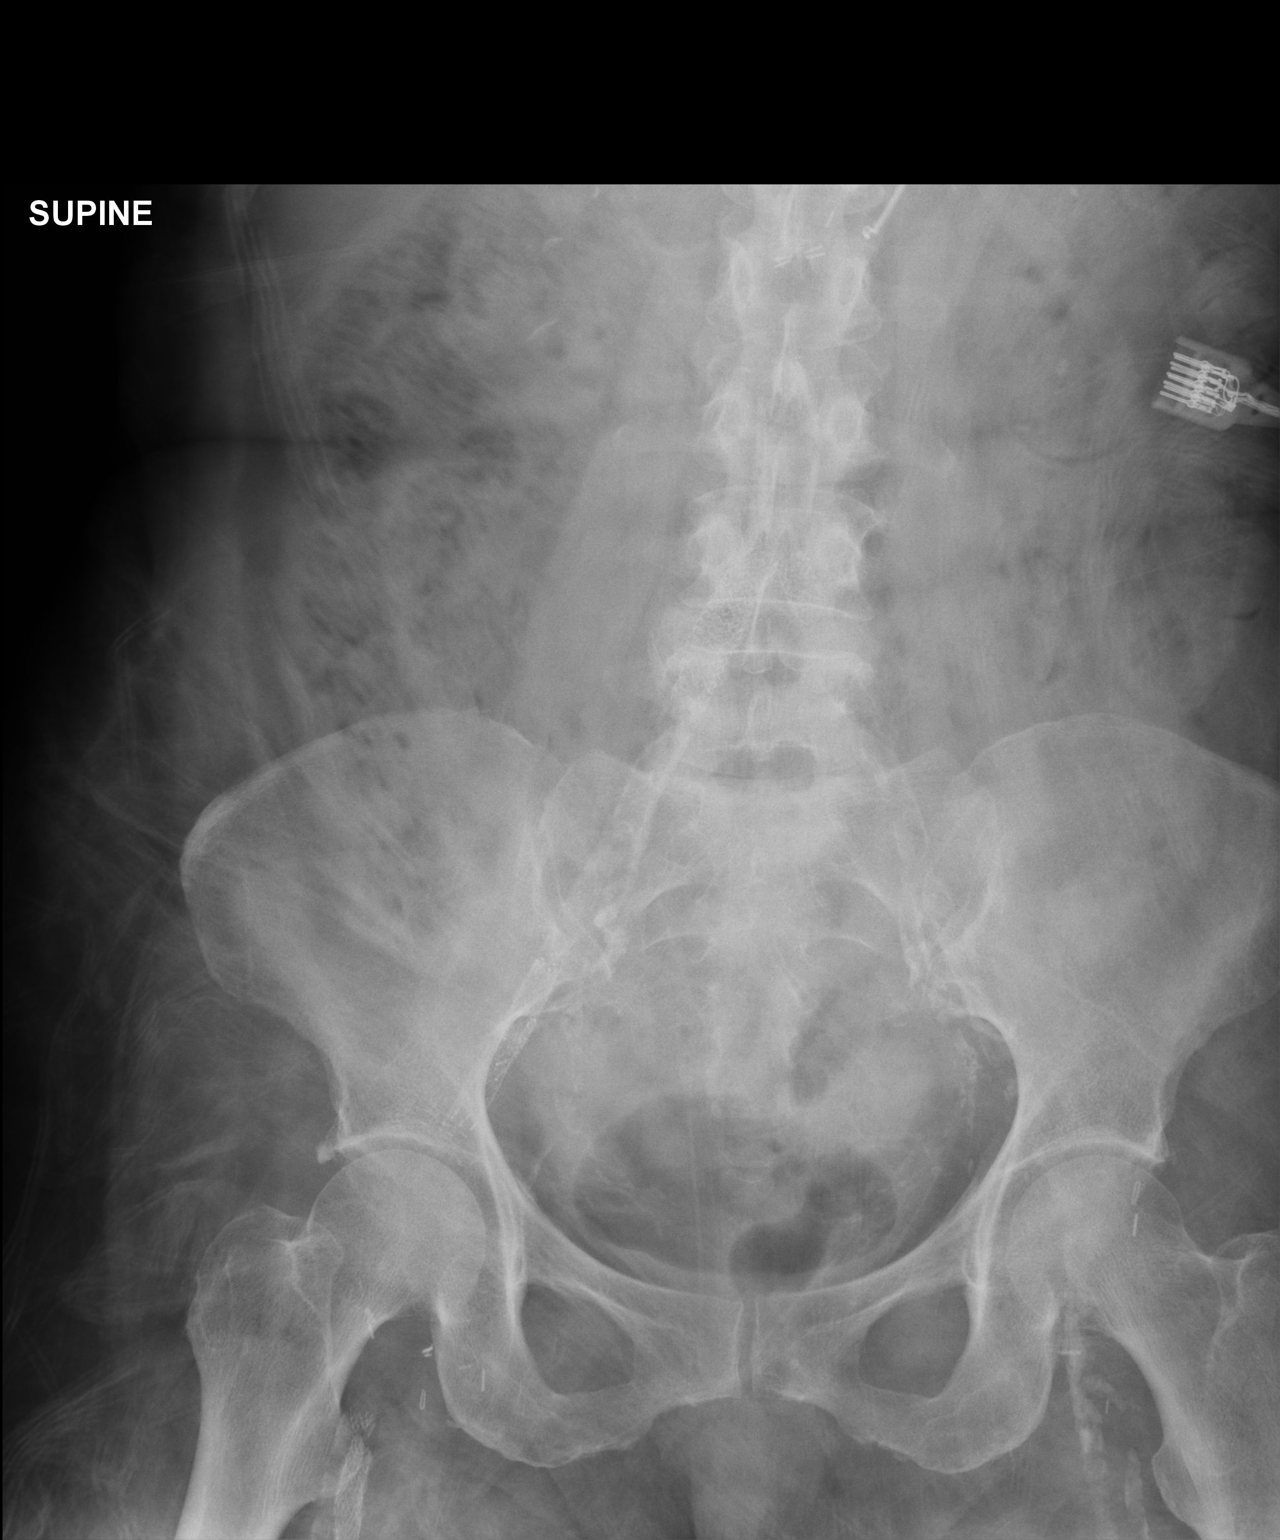

[1 of 1 positions shown; findings below may reference images not displayed]

FINDINGS: Portable supine view 2728 hours.  Enteric tube partially
visible tip at the level of the gastric body.  Mild motion
artifact.  Calcified atherosclerosis, severe in the pelvis and
about the proximal lower extremities.  Bilateral common iliac
artery stent re-identified.  Right external iliac artery stent re-
identified.  Partial visualization of a stent in the proximal right
lower extremity.  Paucity of bowel gas, blurred by motion.
Questionable gas in the bladder, may reflect recent catheter
placement.
IMPRESSION: 1.  Advanced pelvic and proximal femoral atherosclerosis with
multiple vascular stent in place.
2.  Suggestion of gas within the bladder, query recent Foley
placement/removal.
3.  Motion artifact affecting the visualized bowel gas.

## 2014-08-18 ENCOUNTER — Encounter (HOSPITAL_COMMUNITY): Payer: Self-pay | Admitting: Emergency Medicine

## 2014-08-18 ENCOUNTER — Inpatient Hospital Stay (HOSPITAL_COMMUNITY)
Admission: EM | Admit: 2014-08-18 | Discharge: 2014-08-24 | DRG: 690 | Disposition: A | Discharge status: Home / Self Care | Payer: Medicare Other | Attending: Endocrinology | Admitting: Endocrinology

## 2014-08-18 DIAGNOSIS — I5032 Chronic diastolic (congestive) heart failure: Secondary | ICD-10-CM | POA: Diagnosis present

## 2014-08-18 DIAGNOSIS — Z955 Presence of coronary angioplasty implant and graft: Secondary | ICD-10-CM

## 2014-08-18 DIAGNOSIS — I2510 Atherosclerotic heart disease of native coronary artery without angina pectoris: Secondary | ICD-10-CM | POA: Diagnosis present

## 2014-08-18 DIAGNOSIS — D509 Iron deficiency anemia, unspecified: Secondary | ICD-10-CM | POA: Diagnosis present

## 2014-08-18 DIAGNOSIS — G4700 Insomnia, unspecified: Secondary | ICD-10-CM | POA: Diagnosis present

## 2014-08-18 DIAGNOSIS — R319 Hematuria, unspecified: Secondary | ICD-10-CM | POA: Diagnosis not present

## 2014-08-18 DIAGNOSIS — I70219 Atherosclerosis of native arteries of extremities with intermittent claudication, unspecified extremity: Secondary | ICD-10-CM | POA: Diagnosis present

## 2014-08-18 DIAGNOSIS — R310 Gross hematuria: Secondary | ICD-10-CM

## 2014-08-18 DIAGNOSIS — Z452 Encounter for adjustment and management of vascular access device: Secondary | ICD-10-CM

## 2014-08-18 DIAGNOSIS — K219 Gastro-esophageal reflux disease without esophagitis: Secondary | ICD-10-CM | POA: Diagnosis present

## 2014-08-18 DIAGNOSIS — Z89519 Acquired absence of unspecified leg below knee: Secondary | ICD-10-CM

## 2014-08-18 DIAGNOSIS — I7300 Raynaud's syndrome without gangrene: Secondary | ICD-10-CM | POA: Diagnosis present

## 2014-08-18 DIAGNOSIS — E1149 Type 2 diabetes mellitus with other diabetic neurological complication: Secondary | ICD-10-CM | POA: Diagnosis present

## 2014-08-18 DIAGNOSIS — Z90710 Acquired absence of both cervix and uterus: Secondary | ICD-10-CM

## 2014-08-18 DIAGNOSIS — I10 Essential (primary) hypertension: Secondary | ICD-10-CM | POA: Diagnosis present

## 2014-08-18 DIAGNOSIS — N3021 Other chronic cystitis with hematuria: Secondary | ICD-10-CM | POA: Diagnosis not present

## 2014-08-18 DIAGNOSIS — Z7982 Long term (current) use of aspirin: Secondary | ICD-10-CM

## 2014-08-18 DIAGNOSIS — N200 Calculus of kidney: Secondary | ICD-10-CM | POA: Diagnosis present

## 2014-08-18 DIAGNOSIS — N390 Urinary tract infection, site not specified: Secondary | ICD-10-CM | POA: Diagnosis present

## 2014-08-18 DIAGNOSIS — F329 Major depressive disorder, single episode, unspecified: Secondary | ICD-10-CM | POA: Diagnosis present

## 2014-08-18 DIAGNOSIS — E785 Hyperlipidemia, unspecified: Secondary | ICD-10-CM | POA: Diagnosis present

## 2014-08-18 DIAGNOSIS — Z87891 Personal history of nicotine dependence: Secondary | ICD-10-CM

## 2014-08-18 DIAGNOSIS — A419 Sepsis, unspecified organism: Secondary | ICD-10-CM

## 2014-08-18 DIAGNOSIS — I252 Old myocardial infarction: Secondary | ICD-10-CM

## 2014-08-18 DIAGNOSIS — K5900 Constipation, unspecified: Secondary | ICD-10-CM | POA: Diagnosis present

## 2014-08-18 DIAGNOSIS — F419 Anxiety disorder, unspecified: Secondary | ICD-10-CM | POA: Diagnosis present

## 2014-08-18 DIAGNOSIS — E1165 Type 2 diabetes mellitus with hyperglycemia: Secondary | ICD-10-CM | POA: Diagnosis present

## 2014-08-18 DIAGNOSIS — E780 Pure hypercholesterolemia: Secondary | ICD-10-CM | POA: Diagnosis present

## 2014-08-18 DIAGNOSIS — N131 Hydronephrosis with ureteral stricture, not elsewhere classified: Secondary | ICD-10-CM | POA: Diagnosis present

## 2014-08-18 LAB — CBC WITH DIFFERENTIAL/PLATELET
Basophils Absolute: 0 10*3/uL (ref 0.0–0.1)
Basophils Relative: 0 % (ref 0–1)
Eosinophils Absolute: 0.1 10*3/uL (ref 0.0–0.7)
Eosinophils Relative: 1 % (ref 0–5)
HEMATOCRIT: 30.8 % — AB (ref 36.0–46.0)
Hemoglobin: 9.8 g/dL — ABNORMAL LOW (ref 12.0–15.0)
Lymphocytes Relative: 21 % (ref 12–46)
Lymphs Abs: 1.9 10*3/uL (ref 0.7–4.0)
MCH: 25.5 pg — AB (ref 26.0–34.0)
MCHC: 31.8 g/dL (ref 30.0–36.0)
MCV: 80 fL (ref 78.0–100.0)
Monocytes Absolute: 0.5 10*3/uL (ref 0.1–1.0)
Monocytes Relative: 6 % (ref 3–12)
NEUTROS ABS: 6.5 10*3/uL (ref 1.7–7.7)
NEUTROS PCT: 72 % (ref 43–77)
Platelets: 431 10*3/uL — ABNORMAL HIGH (ref 150–400)
RBC: 3.85 MIL/uL — AB (ref 3.87–5.11)
RDW: 18.2 % — ABNORMAL HIGH (ref 11.5–15.5)
WBC: 9.1 10*3/uL (ref 4.0–10.5)

## 2014-08-18 LAB — CBG MONITORING, ED: Glucose-Capillary: 341 mg/dL — ABNORMAL HIGH (ref 70–99)

## 2014-08-18 LAB — BASIC METABOLIC PANEL
Anion gap: 15 (ref 5–15)
BUN: 17 mg/dL (ref 6–23)
CO2: 20 mmol/L (ref 19–32)
CREATININE: 1.14 mg/dL — AB (ref 0.50–1.10)
Calcium: 9.3 mg/dL (ref 8.4–10.5)
Chloride: 96 mmol/L (ref 96–112)
GFR calc Af Amer: 56 mL/min — ABNORMAL LOW (ref >90)
GFR, EST NON AFRICAN AMERICAN: 48 mL/min — AB (ref >90)
Glucose, Bld: 343 mg/dL — ABNORMAL HIGH (ref 70–99)
Potassium: 4.3 mmol/L (ref 3.5–5.1)
Sodium: 131 mmol/L — ABNORMAL LOW (ref 135–145)

## 2014-08-18 MED ORDER — SODIUM CHLORIDE 0.9 % IV BOLUS (SEPSIS)
1000.0000 mL | Freq: Once | INTRAVENOUS | Status: AC
  Administered 2014-08-18: 1000 mL via INTRAVENOUS

## 2014-08-18 MED ORDER — LORAZEPAM 2 MG/ML IJ SOLN
0.5000 mg | Freq: Once | INTRAMUSCULAR | Status: AC
  Administered 2014-08-18: 0.5 mg via INTRAVENOUS
  Filled 2014-08-18: qty 1

## 2014-08-18 MED ORDER — LORAZEPAM 2 MG/ML IJ SOLN
0.5000 mg | Freq: Once | INTRAMUSCULAR | Status: AC
  Administered 2014-08-19: 0.5 mg via INTRAVENOUS
  Filled 2014-08-18: qty 1

## 2014-08-18 NOTE — ED Notes (Signed)
CBG 341. 

## 2014-08-18 NOTE — ED Provider Notes (Signed)
CSN: [id number]     Arrival date & time 08/18/14  2041 History   First MD Initiated Contact with Patient 08/18/14 2122     Chief Complaint  Patient presents with  . Hematuria     (Consider location/radiation/quality/duration/timing/severity/associated sxs/prior Treatment) Patient is a 69 y.o. female presenting with hematuria. The history is provided by the patient and the spouse. No language interpreter was used.  Hematuria This is a new problem. The current episode started today. Pertinent negatives include no chills or fever. Associated symptoms comments: She provides the history of UTI for the past 8 months, treated/followed by Dr. Karsten Ro. She had a cystoscopy one month ago and reports infection was still present. She also reports urinary incontinence for several months without foley catheter. Today, around 7:30 p.m., she felt her Depends needed to be changed and when she went to the bathroom to change it, she found a significant amount of blood with clots. The bleeding has been continuous since that time. She currently feels lightheaded and diaphoretic. No syncope. No recent fever..    Past Medical History  Diagnosis Date  . Hypertension     Unspecified  . Hyperlipidemia     Mixed  . Tobacco abuse     Remote  . Loose, teeth     has loose bridge and two loose teeth holding it  . PONV (postoperative nausea and vomiting)   . Type II diabetes mellitus   . GERD (gastroesophageal reflux disease)   . Anxiety   . Peripheral vascular disease     unspecified, a. s/p L CEA b. s/p B fem-pop bypass  . Raynaud disease   . Stones in the urinary tract   . UTI (urinary tract infection)   . Anemia     hx  . Coronary artery disease     a. s/p NSTEMI 02/2009 - PCI LCX with Xience DES. Otherwise branch vessel and Dist. RCA dzs. NL EF.   Marland Kitchen Myocardial infarction   . CHF (congestive heart failure)   . Kidney stones   . UTI (lower urinary tract infection)   . Constipation   . Hernia, umbilical    . History of blood transfusion   . Fall from slipping on wet surface Jan. 7, 2014    Right stump bleeding from fall  . Carotid artery occlusion    Past Surgical History  Procedure Laterality Date  . Vesicovaginal fistula closure w/ tah    . Colonoscopy  11/2010  . Upper gastrointestinal endoscopy  11/2010  . Femoral artery - popliteal artery bypass graft      left  . Abdominal hysterectomy      complete  . Femoral-popliteal bypass graft  08/22/2011    Procedure: BYPASS GRAFT FEMORAL-POPLITEAL ARTERY;  Surgeon: Curt Jews, MD;  Location: [facility];  Service: Vascular;  Laterality: Right;  Thrombectomy and Revision using 35m x 10cm stretch goretex graft  . Intraoperative arteriogram  08/22/2011    Procedure: INTRA OPERATIVE ARTERIOGRAM;  Surgeon: TCurt Jews MD;  [facility];  Service: V[facility];  Laterality: Right;  to lower leg  . Multiple tooth extractions  08-29-2011    5 teeth extracted   . Pci  01/17/12    RLE  . Breast lumpectomy      right  . Tonsillectomy and adenoidectomy    . Coronary angioplasty with stent placement    . Carotid endarterectomy  ~ 2005    left  . Aorta - bilateral femoral artery bypass graft  03/26/2012  Procedure: AORTA BIFEMORAL BYPASS GRAFT;  Surgeon: Rosetta Posner, MD;  Location: [facility] OR;  Service: Vascular;  Laterality: N/A;  Aortic-bifemoral bypass using 14x16m Hemashield graft .   . Umbilical hernia repair  03/26/2012    Procedure: HERNIA REPAIR UMBILICAL ADULT;  Surgeon: TRosetta Posner MD;  [facility];  Service: Vascular;  Laterality: N/A;  Removal of Umbilical hernia sac  . Femoral artery exploration  03/26/2012    Procedure: FEMORAL ARTERY EXPLORATION;  Surgeon: TRosetta Posner MD;  [facility];  Service: Vascular;  Laterality: Right;  with Revision of Popliteal-Peroneal bypass graft using 648mx 10cm thin wall goretex graft  . Amputation  04/23/2012    Procedure: AMPUTATION BELOW KNEE;  Surgeon: ToRosetta PosnerMD;  [facility]  [facility];  Laterality: Right;  . Eye surgery Right Feb. 9, 2015    Cataract  . Eye surgery Left Feb. 16, 2015    Cataract  . Abdominal aortagram N/A 05/17/2011    Procedure: ABDOMINAL AORTAGRAM;  Surgeon: VaSerafina MitchellMD;  [facility];  Service: Cardiovascular;  Laterality: N/A;  . Lower extremity angiogram  05/17/2011    Procedure: LOWER EXTREMITY ANGIOGRAM;  Surgeon: VaSerafina MitchellMD;  [facility];  Service: Cardiovascular;;  . Percutaneous stent intervention Right 05/17/2011    Procedure: PERCUTANEOUS STENT INTERVENTION;  Surgeon: VaSerafina MitchellMD;  [facility];  Service: Cardiovascular;  Laterality: Right;  . Lower extremity angiogram N/A 09/21/2011    Procedure: LOWER EXTREMITY ANGIOGRAM;  Surgeon: VaSerafina MitchellMD;  [facility];  Service: Cardiovascular;  Laterality: N/A;  . Abdominal angiogram  09/21/2011    Procedure: ABDOMINAL ANGIOGRAM;  Surgeon: VaSerafina MitchellMD;  [facility];  Service: Cardiovascular;;  . Abdominal aortagram N/A 01/17/2012    Procedure: ABDOMINAL AOMaxcine Ham Surgeon: VaSerafina MitchellMD;  [facility];  Service: Cardiovascular;  Laterality: N/A;  . Left heart catheterization with coronary angiogram N/A 04/05/2012    Procedure: LEFT HEART CATHETERIZATION WITH CORONARY ANGIOGRAM;  Surgeon: MiSherren MochaMD;  [facility];  Service: Cardiovascular;  Laterality: N/A;   Family History  Problem Relation Age of Onset  . Heart attack Mother   . Heart attack Father   . Hypertension Brother   . Diabetes Brother   . Hypertension Brother    History  Substance Use Topics  . Smoking status: Former Smoker -- 2.00 packs/day for 39 years    Types: Cigarettes    Quit date: 07/05/2001  . Smokeless tobacco: Never Used  . Alcohol Use: 8.4 oz/week    14 Standard drinks or equivalent per week   OB History    No data available     Review of Systems  Constitutional: Negative for fever and  chills.  HENT: Negative.   Respiratory: Negative.   Cardiovascular: Negative.   Gastrointestinal: Negative.   Genitourinary: Positive for hematuria.  Musculoskeletal: Negative.   Skin: Negative.   Neurological: Negative.       Allergies  Acetaminophen; Ceftriaxone; Codeine; Erythromycin; Hydrocodone-acetaminophen; Hydromorphone; Ibuprofen; Propoxyphene hcl; Statins; Eggs or egg-derived products; and Shellfish-derived products  Home Medications   Prior to Admission medications   Medication Sig Start Date End Date Taking? Authorizing Provider  ALPRAZolam (XANAX) 0.25 MG tablet Take 1 tablet (0.25 mg total) by mouth 3 (three) times daily as needed for anxiety. Patient taking differently: Take 0.25 mg by mouth daily.  05/08/12  Yes Lavon Paganini Angiulli, PA-C  aspirin 325 MG EC tablet Take 325 mg by mouth every morning.    Yes Historical Provider, MD  dexlansoprazole (DEXILANT) 60 MG capsule Take 60 mg by mouth 2 (two) times daily.   Yes Historical Provider, MD  ezetimibe (ZETIA) 10 MG tablet Take 1 tablet (10 mg total) by mouth daily. 05/16/14  Yes Blane Ohara, MD  fluconazole (DIFLUCAN) 150 MG tablet Take 150 mg by mouth daily.   Yes Historical Provider, MD  furosemide (LASIX) 40 MG tablet Take 1 tablet (40 mg total) by mouth daily. 04/09/12  Yes Regina J Roczniak, PA-C  iron polysaccharides (NIFEREX) 150 MG capsule Take 450 mg by mouth 2 (two) times daily.    Yes Historical Provider, MD  isosorbide mononitrate (IMDUR) 60 MG 24 hr tablet Take 1 tablet (60 mg total) by mouth daily. Patient taking differently: Take 30 mg by mouth 2 (two) times daily.  04/09/12  Yes Regina J Roczniak, PA-C  metoprolol succinate (TOPROL-XL) 50 MG 24 hr tablet Take 50 mg by mouth 2 (two) times daily. Take with or immediately following a meal.   Yes Historical Provider, MD  omega-3 acid ethyl esters (LOVAZA) 1 G capsule Take 2 g by mouth 2 (two) times daily.    Yes Historical Provider, MD   pioglitazone-metformin (ACTOPLUS MET) 15-850 MG per tablet Take 1 tablet by mouth daily. Brand name only-does not work if components are given separately per patient 15-850 MG   Yes Historical Provider, MD  PLAVIX 75 MG tablet TAKE 1 TABLET BY MOUTH ONCE DAILY WITH A MEAL 05/26/14  Yes Blane Ohara, MD  potassium chloride (K-DUR) 10 MEQ tablet Take 1 tablet (10 mEq total) by mouth 2 (two) times daily. 04/09/12  Yes Regina J Roczniak, PA-C  Probiotic Product (ACIDOPHILUS PEARLS) CAPS Take 1 capsule by mouth daily.   Yes Historical Provider, MD  ramipril (ALTACE) 1.25 MG capsule Take 1 capsule (1.25 mg total) by mouth 2 (two) times daily. Patient taking differently: Take 1.25 mg by mouth daily.  05/08/12  Yes Daniel J Angiulli, PA-C  rosuvastatin (CRESTOR) 10 MG tablet Take 1 tablet (10 mg total) by mouth every 7 (seven) days. Takes on Monday's Patient taking differently: Take 10 mg by mouth every Monday. Takes on Monday's 05/16/14  Yes Blane Ohara, MD  sitaGLIPtan (JANUVIA) 100 MG tablet Take 100 mg by mouth at bedtime.    Yes Historical Provider, MD  sulfamethoxazole-trimethoprim (BACTRIM DS,SEPTRA DS) 800-160 MG per tablet Take 1 tablet by mouth 2 (two) times daily.   Yes Historical Provider, MD  Vitamin D, Ergocalciferol, (DRISDOL) 50000 UNITS CAPS Take 1 capsule (50,000 Units total) by mouth every 7 (seven) days. Patient taking differently: Take 50,000 Units by mouth every Monday, Wednesday, and Friday.  05/08/12  Yes Daniel J Angiulli, PA-C  meperidine (DEMEROL) 50 MG tablet Take 1 tablet (50 mg total) by mouth every 6 (six) hours as needed. For pain 07/24/12   Rosetta Posner, MD  nitroGLYCERIN (NITROSTAT) 0.4 MG SL tablet Place 1 tablet (0.4 mg total) under the tongue every 5 (five) minutes as needed. For chest pain 05/16/14   Blane Ohara, MD  prochlorperazine (COMPAZINE) 10 MG tablet Take 1 tablet (10 mg total) by mouth every 6 (six) hours as needed. For nausea 07/24/12   Rosetta Posner,  MD  promethazine (PHENERGAN) 25 MG tablet  03/20/14   Historical Provider, MD   BP 169/93 mmHg  Pulse 105  Temp(Src) 98 F (36.7 C) (Oral)  Resp 19  Ht 5' 4"  (1.626 m)  Wt 132 lb (59.875 kg)  BMI 22.65 kg/m2  SpO2 100% Physical Exam  Constitutional: She is oriented to person, place, and time. She appears well-developed and well-nourished. No distress.  Eyes: Conjunctivae are normal.  Neck: Normal range of motion.  Cardiovascular: Regular rhythm.  Tachycardia present.   No murmur heard. Pulmonary/Chest: Effort normal.  Abdominal: Soft. She exhibits no mass. There is tenderness. There is no rebound and no guarding.  Diffusely and mildly tender abdomen.  Genitourinary:  Clots at urethra. No vulvar swelling or redness.   Musculoskeletal: Normal range of motion. She exhibits no edema.  Neurological: She is alert and oriented to person, place, and time.  Skin: She is diaphoretic.  Psychiatric: She has a normal mood and affect.    ED Course  Procedures (including critical care time) Labs Review Labs Reviewed  CBC WITH DIFFERENTIAL/PLATELET - Abnormal; Notable for the following:    RBC 3.85 (*)    Hemoglobin 9.8 (*)    HCT 30.8 (*)    MCH 25.5 (*)    RDW 18.2 (*)    Platelets 431 (*)    All other components within normal limits  BASIC METABOLIC PANEL - Abnormal; Notable for the following:    Sodium 131 (*)    Glucose, Bld 343 (*)    Creatinine, Ser 1.14 (*)    GFR calc non Af Amer 48 (*)    GFR calc Af Amer 56 (*)    All other components within normal limits    Imaging Review No results found.   EKG Interpretation None      MDM   Final diagnoses:  None    1. Hematuria  The patient is evaluated by Dr. Winfred Leeds. She is persistently diaphoretic, uncomfortable appearing, with baseline anemia of 9, currently at baseline. It is felt with her presentation, on Plavix and aspirin with substantial bleeding the patient would benefit from observation overnight with  recheck of hemoglobin in am. Discussed with hospitalist who accepts the patient for obs admission. IV fluids given with improvement of tachycardia. No further vomiting with medications.     Dewaine Oats, PA-C 08/25/14 2039  Orlie Dakin, MD 08/27/14 [phone]

## 2014-08-18 NOTE — ED Provider Notes (Signed)
Presents with gross hematuria and blood clots from urethra since 7:30 PM tonight. Associated symptoms include dysuria which has occurred for the past several months accompanied by urinary incontinence for several months tonight patient also complains of lightheadedness tonight and general malaise. No fever no vomiting on exam patient is alert Glasgow Coma Score 15 mildly uncomfortable appearing lungs clear auscultation heart regular rate and rhythm abdomen nondistended nontender. External genitalia with blood clots coming from urethra. Skin grossly diaphoretic  Doug Sou, MD 08/19/14 [phone]

## 2014-08-18 NOTE — ED Notes (Signed)
Pt's acuity changed at the request of Ethelda Chick, MD

## 2014-08-18 NOTE — ED Notes (Signed)
PT is on plavix

## 2014-08-18 NOTE — ED Notes (Addendum)
Per EMS- pt has been battling a UTI for seven months. Was at home and went to change her pad and noted it was saturated in bloody urine.  Pt pad changed and upon arrival to room has pad covered. Large blood clot removed at present.  Pt is hypertensive at 190/100 and tachy at 100 HR, pt is also diaphoretic.

## 2014-08-18 NOTE — ED Notes (Signed)
Pt has had a total hysterectomy.

## 2014-08-19 ENCOUNTER — Encounter (HOSPITAL_BASED_OUTPATIENT_CLINIC_OR_DEPARTMENT_OTHER): Payer: Medicare Other | Attending: General Surgery

## 2014-08-19 DIAGNOSIS — Z90710 Acquired absence of both cervix and uterus: Secondary | ICD-10-CM | POA: Diagnosis not present

## 2014-08-19 DIAGNOSIS — I252 Old myocardial infarction: Secondary | ICD-10-CM | POA: Diagnosis not present

## 2014-08-19 DIAGNOSIS — N3021 Other chronic cystitis with hematuria: Secondary | ICD-10-CM | POA: Diagnosis present

## 2014-08-19 DIAGNOSIS — I5032 Chronic diastolic (congestive) heart failure: Secondary | ICD-10-CM | POA: Diagnosis present

## 2014-08-19 DIAGNOSIS — I2510 Atherosclerotic heart disease of native coronary artery without angina pectoris: Secondary | ICD-10-CM | POA: Diagnosis present

## 2014-08-19 DIAGNOSIS — I10 Essential (primary) hypertension: Secondary | ICD-10-CM | POA: Diagnosis present

## 2014-08-19 DIAGNOSIS — N200 Calculus of kidney: Secondary | ICD-10-CM | POA: Diagnosis present

## 2014-08-19 DIAGNOSIS — K5900 Constipation, unspecified: Secondary | ICD-10-CM | POA: Diagnosis present

## 2014-08-19 DIAGNOSIS — Z7982 Long term (current) use of aspirin: Secondary | ICD-10-CM | POA: Diagnosis not present

## 2014-08-19 DIAGNOSIS — F329 Major depressive disorder, single episode, unspecified: Secondary | ICD-10-CM | POA: Diagnosis present

## 2014-08-19 DIAGNOSIS — F419 Anxiety disorder, unspecified: Secondary | ICD-10-CM | POA: Diagnosis present

## 2014-08-19 DIAGNOSIS — E780 Pure hypercholesterolemia: Secondary | ICD-10-CM | POA: Diagnosis present

## 2014-08-19 DIAGNOSIS — Z955 Presence of coronary angioplasty implant and graft: Secondary | ICD-10-CM | POA: Diagnosis not present

## 2014-08-19 DIAGNOSIS — Z87891 Personal history of nicotine dependence: Secondary | ICD-10-CM | POA: Diagnosis not present

## 2014-08-19 DIAGNOSIS — E1165 Type 2 diabetes mellitus with hyperglycemia: Secondary | ICD-10-CM | POA: Diagnosis present

## 2014-08-19 DIAGNOSIS — E785 Hyperlipidemia, unspecified: Secondary | ICD-10-CM | POA: Diagnosis present

## 2014-08-19 DIAGNOSIS — D509 Iron deficiency anemia, unspecified: Secondary | ICD-10-CM | POA: Diagnosis present

## 2014-08-19 DIAGNOSIS — N390 Urinary tract infection, site not specified: Secondary | ICD-10-CM | POA: Diagnosis present

## 2014-08-19 DIAGNOSIS — Z89519 Acquired absence of unspecified leg below knee: Secondary | ICD-10-CM | POA: Diagnosis not present

## 2014-08-19 DIAGNOSIS — G4700 Insomnia, unspecified: Secondary | ICD-10-CM | POA: Diagnosis present

## 2014-08-19 DIAGNOSIS — I7300 Raynaud's syndrome without gangrene: Secondary | ICD-10-CM | POA: Diagnosis present

## 2014-08-19 DIAGNOSIS — N131 Hydronephrosis with ureteral stricture, not elsewhere classified: Secondary | ICD-10-CM | POA: Diagnosis present

## 2014-08-19 DIAGNOSIS — R319 Hematuria, unspecified: Secondary | ICD-10-CM | POA: Diagnosis present

## 2014-08-19 DIAGNOSIS — K219 Gastro-esophageal reflux disease without esophagitis: Secondary | ICD-10-CM | POA: Diagnosis present

## 2014-08-19 DIAGNOSIS — E1149 Type 2 diabetes mellitus with other diabetic neurological complication: Secondary | ICD-10-CM | POA: Diagnosis present

## 2014-08-19 LAB — URINALYSIS, ROUTINE W REFLEX MICROSCOPIC
Glucose, UA: 500 mg/dL — AB
KETONES UR: 40 mg/dL — AB
Nitrite: POSITIVE — AB
Protein, ur: >300 mg/dL — AB
Specific Gravity, Urine: 1.031 — ABNORMAL HIGH (ref 1.005–1.030)
UROBILINOGEN UA: 4 mg/dL — AB (ref 0.0–1.0)
pH: 6 (ref 5.0–8.0)

## 2014-08-19 LAB — GLUCOSE, CAPILLARY
GLUCOSE-CAPILLARY: 396 mg/dL — AB (ref 70–99)
GLUCOSE-CAPILLARY: 263 mg/dL — AB (ref 70–99)
Glucose-Capillary: 380 mg/dL — ABNORMAL HIGH (ref 70–99)
Glucose-Capillary: 289 mg/dL — ABNORMAL HIGH (ref 70–99)
Glucose-Capillary: 369 mg/dL — ABNORMAL HIGH (ref 70–99)
Glucose-Capillary: 219 mg/dL — ABNORMAL HIGH (ref 70–99)

## 2014-08-19 LAB — URINE MICROSCOPIC-ADD ON

## 2014-08-19 LAB — LACTIC ACID, PLASMA: Lactic Acid, Venous: 1.9 mmol/L (ref 0.5–2.0)

## 2014-08-19 LAB — TROPONIN I: TROPONIN I: 0.04 ng/mL — AB (ref <0.031)

## 2014-08-19 MED ORDER — PROMETHAZINE HCL 25 MG RE SUPP
25.0000 mg | Freq: Four times a day (QID) | RECTAL | Status: DC | PRN
  Administered 2014-08-19: 25 mg via RECTAL
  Filled 2014-08-19 (×2): qty 1

## 2014-08-19 MED ORDER — ASPIRIN EC 325 MG PO TBEC
325.0000 mg | DELAYED_RELEASE_TABLET | Freq: Every morning | ORAL | Status: DC
  Filled 2014-08-19: qty 1

## 2014-08-19 MED ORDER — BACID PO TABS
1.0000 | ORAL_TABLET | Freq: Every day | ORAL | Status: DC
  Administered 2014-08-20 – 2014-08-24 (×5): 1 via ORAL
  Filled 2014-08-19 (×7): qty 1

## 2014-08-19 MED ORDER — PROCHLORPERAZINE MALEATE 10 MG PO TABS
10.0000 mg | ORAL_TABLET | Freq: Four times a day (QID) | ORAL | Status: DC | PRN
  Administered 2014-08-20 – 2014-08-22 (×6): 10 mg via ORAL
  Filled 2014-08-19 (×7): qty 1

## 2014-08-19 MED ORDER — OMEGA-3-ACID ETHYL ESTERS 1 G PO CAPS
2.0000 g | ORAL_CAPSULE | Freq: Two times a day (BID) | ORAL | Status: DC
  Administered 2014-08-20 – 2014-08-24 (×9): 2 g via ORAL
  Filled 2014-08-19 (×12): qty 2

## 2014-08-19 MED ORDER — PIOGLITAZONE HCL 15 MG PO TABS
15.0000 mg | ORAL_TABLET | Freq: Every day | ORAL | Status: DC
  Filled 2014-08-19 (×2): qty 1

## 2014-08-19 MED ORDER — CLOPIDOGREL BISULFATE 75 MG PO TABS
75.0000 mg | ORAL_TABLET | Freq: Every day | ORAL | Status: DC
  Filled 2014-08-19: qty 1

## 2014-08-19 MED ORDER — PIOGLITAZONE HCL-METFORMIN HCL 15-850 MG PO TABS: 1.0000 | ORAL_TABLET | Freq: Every day | ORAL | Status: DC

## 2014-08-19 MED ORDER — INSULIN ASPART 100 UNIT/ML ~~LOC~~ SOLN
0.0000 [IU] | Freq: Three times a day (TID) | SUBCUTANEOUS | Status: DC
  Administered 2014-08-19: 5 [IU] via SUBCUTANEOUS
  Administered 2014-08-19 (×2): 9 [IU] via SUBCUTANEOUS
  Administered 2014-08-19: 3 [IU] via SUBCUTANEOUS
  Administered 2014-08-20 (×2): 5 [IU] via SUBCUTANEOUS
  Administered 2014-08-20: 3 [IU] via SUBCUTANEOUS
  Administered 2014-08-21 – 2014-08-22 (×4): 2 [IU] via SUBCUTANEOUS
  Administered 2014-08-23: 1 [IU] via SUBCUTANEOUS
  Administered 2014-08-24 (×2): 3 [IU] via SUBCUTANEOUS
  Administered 2014-08-24: 2 [IU] via SUBCUTANEOUS

## 2014-08-19 MED ORDER — ONDANSETRON HCL 4 MG/2ML IJ SOLN
4.0000 mg | Freq: Once | INTRAMUSCULAR | Status: AC
  Administered 2014-08-19: 4 mg via INTRAVENOUS
  Filled 2014-08-19: qty 2

## 2014-08-19 MED ORDER — METFORMIN HCL 850 MG PO TABS
850.0000 mg | ORAL_TABLET | Freq: Every day | ORAL | Status: DC
  Filled 2014-08-19 (×2): qty 1

## 2014-08-19 MED ORDER — ISOSORBIDE MONONITRATE ER 30 MG PO TB24
30.0000 mg | ORAL_TABLET | Freq: Two times a day (BID) | ORAL | Status: DC
  Administered 2014-08-20 – 2014-08-24 (×9): 30 mg via ORAL
  Filled 2014-08-19 (×12): qty 1

## 2014-08-19 MED ORDER — METOPROLOL SUCCINATE ER 50 MG PO TB24
50.0000 mg | ORAL_TABLET | Freq: Two times a day (BID) | ORAL | Status: DC
  Administered 2014-08-20 – 2014-08-24 (×9): 50 mg via ORAL
  Filled 2014-08-19 (×12): qty 1

## 2014-08-19 MED ORDER — PANTOPRAZOLE SODIUM 40 MG PO TBEC
40.0000 mg | DELAYED_RELEASE_TABLET | Freq: Every day | ORAL | Status: DC
  Administered 2014-08-21 – 2014-08-24 (×4): 40 mg via ORAL
  Filled 2014-08-19 (×2): qty 1

## 2014-08-19 MED ORDER — RAMIPRIL 1.25 MG PO CAPS
1.2500 mg | ORAL_CAPSULE | Freq: Every day | ORAL | Status: DC
  Filled 2014-08-19 (×2): qty 1

## 2014-08-19 MED ORDER — FLUCONAZOLE 150 MG PO TABS
150.0000 mg | ORAL_TABLET | Freq: Every day | ORAL | Status: DC
  Administered 2014-08-20 – 2014-08-24 (×5): 150 mg via ORAL
  Filled 2014-08-19 (×7): qty 1

## 2014-08-19 MED ORDER — SODIUM CHLORIDE 0.9 % IV SOLN
INTRAVENOUS | Status: DC
  Administered 2014-08-21 – 2014-08-22 (×3): via INTRAVENOUS
  Administered 2014-08-23: 50 mL/h via INTRAVENOUS
  Administered 2014-08-23: 03:00:00 via INTRAVENOUS

## 2014-08-19 MED ORDER — EZETIMIBE 10 MG PO TABS
10.0000 mg | ORAL_TABLET | Freq: Every day | ORAL | Status: DC
  Administered 2014-08-20 – 2014-08-24 (×5): 10 mg via ORAL
  Filled 2014-08-19 (×6): qty 1

## 2014-08-19 MED ORDER — SODIUM CHLORIDE 0.9 % IV SOLN: INTRAVENOUS | Status: DC

## 2014-08-19 MED ORDER — POLYSACCHARIDE IRON COMPLEX 150 MG PO CAPS
450.0000 mg | ORAL_CAPSULE | Freq: Two times a day (BID) | ORAL | Status: DC
  Administered 2014-08-20 – 2014-08-24 (×9): 450 mg via ORAL
  Filled 2014-08-19 (×12): qty 3

## 2014-08-19 MED ORDER — LINAGLIPTIN 5 MG PO TABS
5.0000 mg | ORAL_TABLET | Freq: Every day | ORAL | Status: DC
  Administered 2014-08-21 – 2014-08-24 (×4): 5 mg via ORAL
  Filled 2014-08-19 (×6): qty 1

## 2014-08-19 MED ORDER — MEPERIDINE HCL 50 MG PO TABS
50.0000 mg | ORAL_TABLET | ORAL | Status: DC | PRN
  Administered 2014-08-19 – 2014-08-22 (×4): 50 mg via ORAL
  Filled 2014-08-19 (×4): qty 1

## 2014-08-19 MED ORDER — VITAMIN D (ERGOCALCIFEROL) 1.25 MG (50000 UNIT) PO CAPS
50000.0000 [IU] | ORAL_CAPSULE | ORAL | Status: DC
  Filled 2014-08-19: qty 1

## 2014-08-19 MED ORDER — INSULIN GLARGINE 100 UNIT/ML ~~LOC~~ SOLN
15.0000 [IU] | Freq: Every day | SUBCUTANEOUS | Status: DC
  Administered 2014-08-19: 15 [IU] via SUBCUTANEOUS
  Filled 2014-08-19 (×2): qty 0.15

## 2014-08-19 MED ORDER — SODIUM CHLORIDE 0.9 % IR SOLN
3000.0000 mL | Status: DC
  Administered 2014-08-19 – 2014-08-23 (×6): 3000 mL
  Filled 2014-08-19: qty 3000

## 2014-08-19 MED ORDER — ACIDOPHILUS PEARLS PO CAPS: 1.0000 | ORAL_CAPSULE | Freq: Every day | ORAL | Status: DC

## 2014-08-19 MED ORDER — ROSUVASTATIN CALCIUM 10 MG PO TABS
10.0000 mg | ORAL_TABLET | ORAL | Status: DC
  Filled 2014-08-19: qty 1

## 2014-08-19 MED ORDER — ALPRAZOLAM 0.25 MG PO TABS
0.2500 mg | ORAL_TABLET | Freq: Three times a day (TID) | ORAL | Status: DC | PRN
  Administered 2014-08-19 – 2014-08-22 (×4): 0.25 mg via ORAL
  Filled 2014-08-19 (×4): qty 1

## 2014-08-19 MED ORDER — NITROGLYCERIN 0.4 MG SL SUBL: 0.4000 mg | SUBLINGUAL_TABLET | SUBLINGUAL | Status: DC | PRN

## 2014-08-19 MED ORDER — POTASSIUM CHLORIDE ER 10 MEQ PO TBCR
10.0000 meq | EXTENDED_RELEASE_TABLET | Freq: Two times a day (BID) | ORAL | Status: DC
  Administered 2014-08-20 – 2014-08-24 (×9): 10 meq via ORAL
  Filled 2014-08-19 (×12): qty 1

## 2014-08-19 MED ORDER — FUROSEMIDE 40 MG PO TABS
40.0000 mg | ORAL_TABLET | Freq: Every day | ORAL | Status: DC
  Administered 2014-08-20 – 2014-08-24 (×5): 40 mg via ORAL
  Filled 2014-08-19 (×6): qty 1

## 2014-08-19 NOTE — Progress Notes (Signed)
Called Dr. Evlyn Kanner office as well to inform him about the blood clot around the foley & leakage around the foley as well. Also to see if he had spoken to the urologist.  Will await for return call as well and continue to monitor.  Forbes Cellar, RN

## 2014-08-19 NOTE — Progress Notes (Signed)
Lazarus Gowda RN assessed veins in left upper arm for PICC placement using ultrasound.  Veins are too small , with the 5 FR PICC catheter occupying 45% of the vein.  Will notify staff RN

## 2014-08-19 NOTE — Progress Notes (Signed)
Discussed ongoing bleeding with both Drs Arbie Cookey and Mena Goes  In light of ongoing bleeding, will stop antiplatelet agents Will get PICC for access and labs  Will watch PVD carefully  Kaylee Curtis MD [facility]

## 2014-08-19 NOTE — Progress Notes (Signed)
Pt and spouse at the bedside made aware that Critical Care Team will consult regard to PICC/Central line placement per Dr Evlyn Kanner report. Stated they prefer to wait until tomorrow morning to proceed. Informed to let CCM team member know of their wishes.

## 2014-08-19 NOTE — Progress Notes (Signed)
Patient unable to received IV  From IV team  Per poor access. Notified Dr Evlyn Kanner about A PICC and meds for nausea.  Also made him aware that CBI has been initated.

## 2014-08-19 NOTE — Progress Notes (Signed)
Paged [facility] to notify patient is on unit.

## 2014-08-19 NOTE — Progress Notes (Signed)
Pt. C/O of itching & pain around the catheter area, upon inspection RN notice a lot of bright red blood and large blood clot right on the top of the catheter.   Pt. Has a 20 F catheter with 30 ml of fluid.  When RN checked to see how much fluid in the balloon only 7 ml of fluid noted.  RN placed 20 ml of fluid and pt. Started c/o more pain, so RN remove 10 ml of fluid, which was more comfortable for pt.  Paged Dr. Mena Goes will await for a return call.  Forbes Cellar, RN

## 2014-08-19 NOTE — Progress Notes (Signed)
[facility]   Approached patient and husband to evaluate and obtain consent for CVL placement. They have informed me at this time that they wish to not have the procedure done. They would rather wait until the morning because she has been through a lot already today. Please call in AM if she and the primary team would still like CVL placed.   Joneen Roach, AGACNP-BC [facility]/Critical Care Pager [phone] or [phone]

## 2014-08-19 NOTE — Progress Notes (Signed)
Consult: gross hematuria Requested by: Dr. Ethelda Chick  Pt seen and examined - full consult to follow. Past urine cultures in office mixed growth and negative (not a good guide toward antibiotic therapy). One Dec 2015 U/A microscopy did show yeast. Pt reports starting fluconazole recently for possible yeast infection.   Urine clearing in tube - now light red, no clots, clear at top.   A: gross hematuria; chronic cystitis P: no new recs. Does not require other GU intervention (CBI, cystoscopy) at this time. Cont supportive care. Urine cx pending. I will notify Dr. Vernie Ammons of admission.

## 2014-08-19 NOTE — Progress Notes (Signed)
Nurse called and patient foley stopped draining.   Procedure: I irrigated her 20 Fr foley to clear. There was a clot around the urethral meatus/foley. On exam the bladder was mildly distended and the foley was in proper position (balloon at bladder neck). There is still some hematuria and small clots forming so I removed the 20 Fr and placed a 22 Fr 3 way foley. Bladder irrigated to clear and CBI started. CBI running normally. Urine light red on a brisk rate.   Gross hematuria - I still suspect hemorrhagic cystitis, bladder source -  -cbc and bmet ordered -consider repeat imaging once stable - IV access, daily labs obtained - may need IV and blood transfusion -can ASA / plavix be held -called and discussed patient with Dr. Evlyn Kanner

## 2014-08-19 NOTE — Progress Notes (Signed)
Inpatient Diabetes Program Recommendations  AACE/ADA: New Consensus Statement on Inpatient Glycemic Control (2013)  Target Ranges:  Prepandial:   less than 140 mg/dL      Peak postprandial:   less than 180 mg/dL (1-2 hours)      Critically ill patients:  140 - 180 mg/dL     Results for KALINA, KAMMER (MRN [id number]) as of 08/19/2014 13:39  Ref. Range 08/19/2014 03:34 08/19/2014 04:49 08/19/2014 07:52 08/19/2014 13:11  Glucose-Capillary Latest Range: 70-99 mg/dL 194 (H) 174 (H) 081 (H) 369 (H)    Admitted with Hematuria.  History of DM, HTN, PVD, CHF.   Home DM Meds: ActoplusMet 15/850 once daily       Januvia 100 mg QHS   Current Orders: Home Oral DM meds      Lantus 15 units daily (started at 1PM today)      Novolog Sensitiv SSI (started at 5am today)    **Went in to speak with patient about the fact that we have started her on insulin here in the hospital.  Patient was lying in bed resting, Husband at bedside.  Patient's husband told me Dr. Evlyn Kanner was "very sneaky" for starting insulin and he told me to tell Dr. Evlyn Kanner to "expect a call from a lawyer".  Was unsure what patient's husband meant by this.    **Discussed why we started insulin here in the hospital.  Patient's husband wanted to know when patient would be discharged home.  I told patient and her husband that her discharge would be determined when her condition has improved.    Will follow while inpatient  Ambrose Finland RN, MSN, CDE Diabetes Coordinator Inpatient Diabetes Program Team Pager: [phone] (8a-10p)

## 2014-08-19 NOTE — Progress Notes (Signed)
UR completed 

## 2014-08-19 NOTE — Progress Notes (Signed)
NURSING PROGRESS NOTE  [name] [id number] Admission Data: 08/19/2014 4:13 AM Attending Provider: Hoyle Sauer, MD HAL:[id number] Hessie Diener, MD Code Status: Full  [name] is a 69 y.o. female patient admitted from ED:  -No acute distress noted.  -No complaints of shortness of breath.  -No complaints of chest pain.    Blood pressure 191/69, pulse 83, temperature 98.2 F (36.8 C), temperature source Oral, resp. rate 14, height 5' 4.8" (1.646 m), weight 58.922 kg (129 lb 14.4 oz), SpO2 100 %.   IV Fluids:  IV in place, occlusive dsg intact without redness, IV cath antecubital right, condition patent and no redness none.   Allergies:  Acetaminophen; Ceftriaxone; Codeine; Erythromycin; Hydrocodone-acetaminophen; Hydromorphone; Ibuprofen; Propoxyphene hcl; Statins; Eggs or egg-derived products; and Shellfish-derived products  Past Medical History:   has a past medical history of Hypertension; Hyperlipidemia; Tobacco abuse; Loose, teeth; PONV (postoperative nausea and vomiting); Type II diabetes mellitus; GERD (gastroesophageal reflux disease); Anxiety; Peripheral vascular disease; Raynaud disease; Stones in the urinary tract; UTI (urinary tract infection); Anemia; Coronary artery disease; Myocardial infarction; CHF (congestive heart failure); Kidney stones; UTI (lower urinary tract infection); Constipation; Hernia, umbilical; History of blood transfusion; Fall from slipping on wet surface (Jan. 7, 2014); and Carotid artery occlusion.  Past Surgical History:   has past surgical history that includes Vesicovaginal fistula closure w/ TAH; Colonoscopy (11/2010); Upper gastrointestinal endoscopy (11/2010); Femoral artery - popliteal artery bypass graft; Abdominal hysterectomy; Femoral-popliteal Bypass Graft (08/22/2011); Intraoperative arteriogram (08/22/2011); Multiple tooth extractions (08-29-2011); pci (01/17/12); Breast lumpectomy; Tonsillectomy and adenoidectomy; Coronary angioplasty  with stent; Carotid endarterectomy (~ 2005); Aorta - bilateral femoral artery bypass graft (03/26/2012); Umbilical hernia repair (03/26/2012); Femoral artery debridement (03/26/2012); Amputation (04/23/2012); Eye surgery (Right, Feb. 9, 2015); Eye surgery (Left, Feb. 16, 2015); abdominal aortagram (N/A, 05/17/2011); lower extremity angiogram (05/17/2011); percutaneous stent intervention (Right, 05/17/2011); lower extremity angiogram (N/A, 09/21/2011); abdominal angiogram (09/21/2011); abdominal aortagram (N/A, 01/17/2012); and left heart catheterization with coronary angiogram (N/A, 04/05/2012).  Social History:   reports that she quit smoking about 13 years ago. Her smoking use included Cigarettes. She has a 78 pack-year smoking history. She has never used smokeless tobacco. She reports that she drinks about 8.4 oz of alcohol per week. She reports that she does not use illicit drugs.  Skin: Patient has ulcers on left 1st and 3rd fingers, and great toe of left foot.  Ulcers present on admission and dressed by patient prior to admission.  Patient/Family oriented to room. Information packet given to patient/family. Admission inpatient armband information verified with patient/family to include name and date of birth and placed on patient arm. Side rails up x 2, fall assessment and education completed with patient/family. Patient/family able to verbalize understanding of risk associated with falls and verbalized understanding to call for assistance before getting out of bed. Call light within reach. Patient/family able to voice and demonstrate understanding of unit orientation instructions.

## 2014-08-19 NOTE — H&P (Signed)
PCP:   Sheela Stack, MD   Chief Complaint:  hematuria  HPI 69 YO WF with 8 months of urologic issues including left hydronephrosis and left UPJ obstruction. A stent was placed in the late summer. She does have known nephrolithiasis. She has had several rounds of antibiotics. Acutely yesterday she had gross hematuria with clots and came to the ER. She was admitted due to this issue. She has been wearing a pad due to incontinence. It was acutely bloody with clots yesterday AM. She had itching but no burning. She has had no fever chills or sweats. She has seen no blood in her stool. She has itching pain around the catheter From a general perspective, she has been struggling with ischemic fingers with severe PVD. She has been on plavix and ASA and clearly cannot interrupt this therapy. She was seen recently with much more poorly controlled diabetes but has declined starting on insulin  Review of Systems:  Review of Systems - No CP or SOB. Bowels OK. No abdominal pain. No cough. Hand is not hurting today. Sugars still up Past Medical History: Past Medical History  Diagnosis Date  . Hypertension     Unspecified  . Hyperlipidemia     Mixed  . Tobacco abuse     Remote  . Loose, teeth     has loose bridge and two loose teeth holding it  . PONV (postoperative nausea and vomiting)   . Type II diabetes mellitus   . GERD (gastroesophageal reflux disease)   . Anxiety   . Peripheral vascular disease     unspecified, a. s/p L CEA b. s/p B fem-pop bypass  . Raynaud disease   . Stones in the urinary tract   . UTI (urinary tract infection)   . Anemia     hx  . Coronary artery disease     a. s/p NSTEMI 02/2009 - PCI LCX with Xience DES. Otherwise branch vessel and Dist. RCA dzs. NL EF.   Marland Kitchen Myocardial infarction   . CHF (congestive heart failure)   . Kidney stones   . UTI (lower urinary tract infection)   . Constipation   . Hernia, umbilical   . History of blood transfusion   . Fall from  slipping on wet surface Jan. 7, 2014    Right stump bleeding from fall  . Carotid artery occlusion   Past Medical History (reviewed - no changes required): CHF, diastolic dysfunction, NSTEMI 2013  DM2 Dx around 2000  Hyperlipidemia,  PVD, bilateral stents, ABI 1.00 left and 0.86 right in 10/09  Sept 2011:  1. High-grade right popliteal stenosis distal to the right femoral- popliteal bypass graft, which was successfully treated using a Cordis 6 x 40 self-expanding stent.  2. Focal right external iliac stenosis successfully treated using a Cordis 7 x 30 self-expanding stent.  3. Hemodynamically significant stenosis proximal to the previously deployed right common iliac stent, which required kissing iliac stents for resolution of the stenosis, an Express 10 x 25 was deployed on the right and a Herculink 10 x 28 on the left.  4. Left common femoral stenosis not amenable to percutaneous repair.  HTN  fibrocystic breast dz  Carotid dz with CEA 2004  Raynauds,  CAD, stent placed 08/10, EF 65%  , Foot Ulcer,  Kidney Stones,  Nephrolithiasis,  GERD  1. Coronary artery disease status post recent stent placement in August 2010.  2. Diabetes mellitus type 2.  3. Hypercholesterolemia.  4. Peripheral arterial disease status  post carotid endarterectomy with  elevated ABIs previous admission.  2011 cath  FINAL ASSESSMENT:  1. Widely patent left circumflex stent.  2. Mild to moderate left anterior descending stenosis.  3. Small nondominant right coronary artery.  4. Small vessel coronary artery disease involving the distal left circumflex distribution.  It is possible that some of the patient's chest pain is coming from her small-vessel CAD. She does not have any high-grade stenosis in her proximal epicardial coronaries. She should do well with medical therapy.  Carotids: 1. Doppler velocities suggest a 60% to 79% stenosis of the right proximal internal carotid artery.  2. Patent left carotid  endarterectomy site with no evidence of left internal carotid artery stenosis.  Non_STEMI with cath/Flash Pulm edema 2013  ECHO 2014  carotid 08/2011  EGD and colon 2012: pan diverticulosis   Past Surgical History  Procedure Laterality Date  . Vesicovaginal fistula closure w/ tah    . Colonoscopy  11/2010  . Upper gastrointestinal endoscopy  11/2010  . Femoral artery - popliteal artery bypass graft      left  . Abdominal hysterectomy      complete  . Femoral-popliteal bypass graft  08/22/2011    Procedure: BYPASS GRAFT FEMORAL-POPLITEAL ARTERY;  Surgeon: Curt Jews, MD;  Location: [place];  Service: Vascular;  Laterality: Right;  Thrombectomy and Revision using 31m x 10cm stretch goretex graft  . Intraoperative arteriogram  08/22/2011    Procedure: INTRA OPERATIVE ARTERIOGRAM;  Surgeon: TCurt Jews MD;  [facility];  Service: Vascular;  Laterality: Right;  to lower leg  . Multiple tooth extractions  08-29-2011    5 teeth extracted   . Pci  01/17/12    RLE  . Breast lumpectomy      right  . Tonsillectomy and adenoidectomy    . Coronary angioplasty with stent placement    . Carotid endarterectomy  ~ 2005    left  . Aorta - bilateral femoral artery bypass graft  03/26/2012    Procedure: AORTA BIFEMORAL BYPASS GRAFT;  Surgeon: TRosetta Posner MD;  [facility];  Service: Vascular;  Laterality: N/A;  Aortic-bifemoral bypass using 14x872mHemashield graft .   . Umbilical hernia repair  03/26/2012    Procedure: HERNIA REPAIR UMBILICAL ADULT;  Surgeon: ToRosetta PosnerMD;  [facility];  Service: Vascular;  Laterality: N/A;  Removal of Umbilical hernia sac  . Femoral artery exploration  03/26/2012    Procedure: FEMORAL ARTERY EXPLORATION;  Surgeon: ToRosetta PosnerMD;  [facility];  Service: Vascular;  Laterality: Right;  with Revision of Popliteal-Peroneal bypass graft using 47m1m 10cm thin wall goretex graft  . Amputation  04/23/2012    Procedure: AMPUTATION BELOW KNEE;  Surgeon: TodRosetta PosnerD;  [facility]  Laterality: Right;  . Eye surgery Right Feb. 9, 2015    Cataract  . Eye surgery Left Feb. 16, 2015    Cataract  . Abdominal aortagram N/A 05/17/2011    Procedure: ABDOMINAL AORTAGRAM;  Surgeon: VanSerafina MitchellD;  [facility];  Service: Cardiovascular;  Laterality: N/A;  . Lower extremity angiogram  05/17/2011    Procedure: LOWER EXTREMITY ANGIOGRAM;  Surgeon: VanSerafina MitchellD;  [facility];  Service: Cardiovascular;;  . Percutaneous stent intervention Right 05/17/2011    Procedure: PERCUTANEOUS STENT INTERVENTION;  Surgeon: VanSerafina MitchellD;  [facility];  Service: Cardiovascular;  Laterality: Right;  . Lower  extremity angiogram N/A 09/21/2011    Procedure: LOWER EXTREMITY ANGIOGRAM;  Surgeon: Serafina Mitchell, MD;  [facility] LAB;  Service: Cardiovascular;  Laterality: N/A;  . Abdominal angiogram  09/21/2011    Procedure: ABDOMINAL ANGIOGRAM;  Surgeon: Serafina Mitchell, MD;  Location[facility] CATH LAB;  Service: Cardiovascular;;  . Abdominal aortagram N/A 01/17/2012    Procedure: ABDOMINAL Maxcine Ham;  Surgeon: Serafina Mitchell, MD;  [facility] C[facility] LAB;  Service: Cardiovascular;  Laterality: N/A;  . Left heart catheterization with coronary angiogram N/A 04/05/2012    Procedure: LEFT HEART CATHETERIZATION WITH CORONARY ANGIOGRAM;  Surgeon: Sherren Mocha, MD;  Location: [facility] CATH LAB;  Service: Cardiovascular;  Laterality: N/A;  Surgical History (reviewed - no changes required): L CEA 2005  Cardiac stent  TAH 1993  Benign Breast cyst removed, right breast  Hysterectomy  Tonsillectomy  Stent of the left superficial femoral artery & right common iliac artery 4/08 by Dr Albertine Patricia  2012: PROCEDURE: Intraoperative arteriogram and bypass from the right femoral  to above-knee popliteal bypass. The proximal anastomosis to the below-  knee popliteal artery is the distal anastomosis with 6-mm thin wall  Propaten  Gore-Tex graft.  Jan 2012 PROCEDURE: Left femoral endarterectomy and Dacron patch angioplasty.  EGD may 2012: Received Date: 11/17/2010 1. Duodenum, biopsy, : BENIGN DUODENAL MUCOSA. NO FEATURES OF SPRUE, ACTIVE INFLAMMATION OR GRANULOMAS.  2013 BYPASS GRAFT FEMORAL-POPLITEAL ARTERY, ENDARTERECTOMY FEMORAL  BKA right 2013  03/14: 09/2012 Left ventricle: The cavity size was normal. Wall thickness  was increased in a pattern of mild LVH. Systolic function  was vigorous. The estimated ejection fraction was in the  range of 65% to 70%. Doppler parameters are consistent with  abnormal left ventricular relaxation (grade 1 diastolic  dysfunction).  bilat cataracts      Medications: Prior to Admission medications   Medication Sig Start Date End Date Taking? Authorizing Provider  ALPRAZolam (XANAX) 0.25 MG tablet Take 1 tablet (0.25 mg total) by mouth 3 (three) times daily as needed for anxiety. Patient taking differently: Take 0.25 mg by mouth daily.  05/08/12  Yes Daniel J Angiulli, PA-C  aspirin 325 MG EC tablet Take 325 mg by mouth every morning.    Yes Historical Provider, MD  dexlansoprazole (DEXILANT) 60 MG capsule Take 60 mg by mouth 2 (two) times daily.   Yes Historical Provider, MD  ezetimibe (ZETIA) 10 MG tablet Take 1 tablet (10 mg total) by mouth daily. 05/16/14  Yes Blane Ohara, MD  fluconazole (DIFLUCAN) 150 MG tablet Take 150 mg by mouth daily.   Yes Historical Provider, MD  furosemide (LASIX) 40 MG tablet Take 1 tablet (40 mg total) by mouth daily. 04/09/12  Yes Regina J Roczniak, PA-C  iron polysaccharides (NIFEREX) 150 MG capsule Take 450 mg by mouth 2 (two) times daily.    Yes Historical Provider, MD  isosorbide mononitrate (IMDUR) 60 MG 24 hr tablet Take 1 tablet (60 mg total) by mouth daily. Patient taking differently: Take 30 mg by mouth 2 (two) times daily.  04/09/12  Yes Regina J Roczniak, PA-C  metoprolol succinate (TOPROL-XL) 50 MG 24 hr tablet Take 50 mg by mouth 2  (two) times daily. Take with or immediately following a meal.   Yes Historical Provider, MD  omega-3 acid ethyl esters (LOVAZA) 1 G capsule Take 2 g by mouth 2 (two) times daily.    Yes Historical Provider, MD  pioglitazone-metformin (ACTOPLUS MET) 15-850 MG per tablet Take 1 tablet by mouth daily.  Brand name only-does not work if components are given separately per patient 15-850 MG   Yes Historical Provider, MD  PLAVIX 75 MG tablet TAKE 1 TABLET BY MOUTH ONCE DAILY WITH A MEAL 05/26/14  Yes Blane Ohara, MD  potassium chloride (K-DUR) 10 MEQ tablet Take 1 tablet (10 mEq total) by mouth 2 (two) times daily. 04/09/12  Yes Regina J Roczniak, PA-C  Probiotic Product (ACIDOPHILUS PEARLS) CAPS Take 1 capsule by mouth daily.   Yes Historical Provider, MD  ramipril (ALTACE) 1.25 MG capsule Take 1 capsule (1.25 mg total) by mouth 2 (two) times daily. Patient taking differently: Take 1.25 mg by mouth daily.  05/08/12  Yes Daniel J Angiulli, PA-C  rosuvastatin (CRESTOR) 10 MG tablet Take 1 tablet (10 mg total) by mouth every 7 (seven) days. Takes on Monday's Patient taking differently: Take 10 mg by mouth every Monday. Takes on Monday's 05/16/14  Yes Blane Ohara, MD  sitaGLIPtan (JANUVIA) 100 MG tablet Take 100 mg by mouth at bedtime.    Yes Historical Provider, MD  sulfamethoxazole-trimethoprim (BACTRIM DS,SEPTRA DS) 800-160 MG per tablet Take 1 tablet by mouth 2 (two) times daily.   Yes Historical Provider, MD  Vitamin D, Ergocalciferol, (DRISDOL) 50000 UNITS CAPS Take 1 capsule (50,000 Units total) by mouth every 7 (seven) days. Patient taking differently: Take 50,000 Units by mouth every Monday, Wednesday, and Friday.  05/08/12  Yes Daniel J Angiulli, PA-C  meperidine (DEMEROL) 50 MG tablet Take 1 tablet (50 mg total) by mouth every 6 (six) hours as needed. For pain 07/24/12   Rosetta Posner, MD  nitroGLYCERIN (NITROSTAT) 0.4 MG SL tablet Place 1 tablet (0.4 mg total) under the tongue every 5 (five)  minutes as needed. For chest pain 05/16/14   Blane Ohara, MD  prochlorperazine (COMPAZINE) 10 MG tablet Take 1 tablet (10 mg total) by mouth every 6 (six) hours as needed. For nausea 07/24/12   Rosetta Posner, MD  promethazine (PHENERGAN) 25 MG tablet  03/20/14   Historical Provider, MD    Allergies:   Allergies  Allergen Reactions  . Acetaminophen Nausea Only and Other (See Comments)    Does not tolerate well, nausea  . Ceftriaxone Itching  . Codeine Nausea And Vomiting  . Erythromycin Nausea And Vomiting  . Hydrocodone-Acetaminophen Nausea And Vomiting  . Hydromorphone Nausea And Vomiting  . Ibuprofen Other (See Comments)    Does not tolerate well  . Propoxyphene Hcl Nausea And Vomiting  . Statins Other (See Comments)    Leg myalgias  . Eggs Or Egg-Derived Products Other (See Comments)    "pt does not eat"  . Shellfish-Derived Products Other (See Comments)    "pt does not eat"    Social History:  reports that she quit smoking about 13 years ago. Her smoking use included Cigarettes. She has a 78 pack-year smoking history. She has never used smokeless tobacco. She reports that she drinks about 8.4 oz of alcohol per week. She reports that she does not use illicit drugs.  Family History: Family History  Problem Relation Age of Onset  . Heart attack Mother   . Heart attack Father   . Hypertension Brother   . Diabetes Brother   . Hypertension Brother     Physical Exam: Filed Vitals:   08/19/14 0024 08/19/14 0200 08/19/14 0209 08/19/14 0314  BP: 188/73 189/70 189/70 191/69  Pulse: 77 87 83   Temp:    98.2 F (36.8 C)  TempSrc:  Oral  Resp: _0 Height:    5' 4.8" (1.646 m)  Weight:    58.922 kg (129 lb 14.4 oz)  SpO2: 98% 100% 100% 92%   General appearance: non toxic, in no distress. Lying at 30 degrees,  Head: Normocephalic, without obvious abnormality, atraumatic Eyes: anicteric, no nystagmus  edentulous Neck: no adenopathy, no carotid bruit, no JVD  and thyroid not enlarged, symmetric, no tenderness/mass/nodules Resp: clear no wheeze Cardio: regular with murmur, rare skips GI: soft, non-tender; bowel sounds normal; no masses,  no organomegaly Healed right BKA, diminished left pulse, trace edema. Shiny skin, 3 digits on right hand wrapped Large amount of fresh BRB at introitus and around foley. Bloody urine in urine container, lighter red urine in tube Neurologic: Alert and oriented X 3, normal strength and tone.      Labs on Admission:   Recent Labs  08/18/14 2110  NA 131*  K 4.3  CL 96  CO2 20  GLUCOSE 343*  BUN 17  CREATININE 1.14*  CALCIUM 9.3      Recent Labs  08/18/14 2110  WBC 9.1  NEUTROABS 6.5  HGB 9.8*  HCT 30.8*  MCV 80.0  PLT 431*    Recent Labs  08/19/14 0040  TROPONINI 0.04*       Radiological Exams on Admission: No results found. Orders placed or performed during the hospital encounter of 08/18/14  . ED EKG: 19-Aug-2014 00:26:04 Lysbeth Dicola [facility]/ED ROUTINE RECORD Sinus rhythm Confirmed by [facility]-  . ED EKG  . EKG 12-Lead  . EKG 12-Lead    Assessment/Plan Principal Problem:   UTI (lower urinary tract infection):ACUTE GROSS HEMATURIA: cannot be off blood thinners. Now acutely loosing blood from urologic tract. Needs an intervention to help this Active Problems:   Diabetes mellitus type 2 with neurological manifestations: add some insulin   Hyperlipemia: on Rx   CORONARY ATHEROSCLEROSIS NATIVE CORONARY ARTERY; doing OK, EKG non ischemic, troponin OK   Atherosclerosis of native arteries of extremity with intermittent claudication PVD WITH FINGER ULCERATIONS: still an active issue, no pain at the present HYPERLIPIDEMIA: on Rx [place] 08/19/2014, 8:59 AM

## 2014-08-19 NOTE — Plan of Care (Signed)
Problem: Phase I Progression Outcomes Goal: Voiding-avoid urinary catheter unless indicated Outcome: Not Met (add Reason) Foley placed in emergency department

## 2014-08-19 NOTE — Progress Notes (Signed)
Called ED for report  

## 2014-08-19 NOTE — Progress Notes (Signed)
IV team was unable to place PICC because pt. Veins to small.  Call Dr.South office and then called answering service left number to call RN back.  Will continue to monitor and await for return call.  Forbes Cellar, RN

## 2014-08-19 NOTE — Consult Note (Signed)
Consultation: Gross hematuria Requested by: Dr. Winfred Leeds  History of Present Illness: Patient developed gross hematuria yesterday evening.  She was having some trouble voiding and presented to the emergency department also with some generalized malaise.  Her blood sugars were elevated into the 300s.  She's had some mild dysuria for a few months and has been on nitrofurantoin then Bactrim suppression.  She underwent cystoscopy December 2015 in our office with Dr. 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  The patient has had no fevers or chills.  Again dysuria seems to be stable.  She has had some nausea and reports she vomited last night.  She also reports she recently started fluconazole for possible vaginal yeast infection. She's had no significant flank pain or stone passage.   This morning, her urine is clearing.  It is very light red and clear at the top of the tube.  Her last urinalysis in our office December 2015 showed many bacteria with culture not sent.  Prior culture grew 50,000 mixed growth but also some yeast was noted on microscopy.  Prior urine cultures have been insignificant growth and negative. Renal ultrasound December 2015 in our office showed stable right renal stones and stable left UPJ obstruction.  Postvoid was 63 mL.  She has a known left UPJ obstruction with recent MAG3 Lasix renogram showing near 50-50 function and normal creatinine levels slightly elevated on admission.  She also has some known right small kidney stones.  Again she is not complaining of significant flank pain.  She has a history of urgency and urge incontinence and has tried [employer] without a lot of success.    Past Medical History  Diagnosis Date  . Hypertension     Unspecified  . Hyperlipidemia     Mixed  . Tobacco abuse     Remote  . Loose, teeth     has loose bridge and two loose teeth holding it  . PONV  (postoperative nausea and vomiting)   . Type II diabetes mellitus   . GERD (gastroesophageal reflux disease)   . Anxiety   . Peripheral vascular disease     unspecified, a. s/p L CEA b. s/p B fem-pop bypass  . Raynaud disease   . Stones in the urinary tract   . UTI (urinary tract infection)   . Anemia     hx  . Coronary artery disease     a. s/p NSTEMI 02/2009 - PCI LCX with Xience DES. Otherwise branch vessel and Dist. RCA dzs. NL EF.   Marland Kitchen Myocardial infarction   . CHF (congestive heart failure)   . Kidney stones   . UTI (lower urinary tract infection)   . Constipation   . Hernia, umbilical   . History of blood transfusion   . Fall from slipping on wet surface Jan. 7, 2014    Right stump bleeding from fall  . Carotid artery occlusion    Past Surgical History  Procedure Laterality Date  . Vesicovaginal fistula closure w/ tah    . Colonoscopy  11/2010  . Upper gastrointestinal endoscopy  11/2010  . Femoral artery - popliteal artery bypass graft      left  . Abdominal hysterectomy      complete  . Femoral-popliteal bypass graft  08/22/2011    Procedure: BYPASS GRAFT FEMORAL-POPLITEAL ARTERY;  Surgeon: Curt Jews, MD;  Location: [place];  Service: Vascular;  Laterality: Right;  Thrombectomy and Revision using 64m x 10cm stretch goretex graft  . Intraoperative arteriogram  08/22/2011    Procedure: INTRA OPERATIVE ARTERIOGRAM;  Surgeon: TCurt Jews MD;  [facility]  Service: [facility];  Laterality: Right;  to lower leg  . Multiple tooth extractions  08-29-2011    5 teeth extracted   . Pci  01/17/12    RLE  . Breast lumpectomy      right  . Tonsillectomy and adenoidectomy    . Coronary angioplasty with stent placement    . Carotid endarterectomy  ~ 2005    left  . Aorta - bilateral femoral artery bypass graft  03/26/2012    Procedure: AORTA BIFEMORAL BYPASS GRAFT;  Surgeon: TRosetta Posner MD;  [facility]  Service: Vascular;  Laterality: N/A;  Aortic-bifemoral bypass using  [id number] graft .   . Umbilical hernia repair  03/26/2012    Procedure: HERNIA REPAIR UMBILICAL ADULT;  Surgeon: ToRosetta PosnerMD;  [facility];  Service: Vascular;  Laterality: N/A;  Removal of Umbilical hernia sac  . Femoral artery exploration  03/26/2012    Procedure: FEMORAL ARTERY EXPLORATION;  Surgeon: ToRosetta PosnerMD;  [facility];  Service: Vascular;  Laterality: Right;  with Revision of Popliteal-Peroneal bypass graft using 71m31m 10cm thin wall goretex graft  . Amputation  04/23/2012    Procedure: AMPUTATION BELOW KNEE;  Surgeon: TodRosetta PosnerD;  [facility];  Laterality: Right;  . Eye surgery Right Feb. 9, 2015    Cataract  . Eye surgery Left Feb. 16, 2015    Cataract  . Abdominal aortagram N/A 05/17/2011    Procedure: ABDOMINAL AORTAGRAM;  Surgeon: VanSerafina MitchellD;  [facility];  Service: Cardiovascular;  Laterality: N/A;  . Lower extremity angiogram  05/17/2011    Procedure: LOWER EXTREMITY ANGIOGRAM;  Surgeon: VanSerafina MitchellD;  [facility];  Service: Cardiovascular;;  . Percutaneous stent intervention Right 05/17/2011    Procedure: PERCUTANEOUS STENT INTERVENTION;  Surgeon: VanSerafina MitchellD;  [facility];  Service: Cardiovascular;  Laterality: Right;  . Lower extremity angiogram N/A 09/21/2011    Procedure: LOWER EXTREMITY ANGIOGRAM;  Surgeon: VanSerafina MitchellD;  [facility];  Service: Cardiovascular;  Laterality: N/A;  . Abdominal angiogram  09/21/2011    Procedure: ABDOMINAL ANGIOGRAM;  Surgeon: VanSerafina MitchellD;  [facility];  Service: Cardiovascular;;  . Abdominal aortagram N/A 01/17/2012    Procedure: ABDOMINAL AORMaxcine HamSurgeon: VanSerafina MitchellD;  [facility];  Service: Cardiovascular;  Laterality: N/A;  . Left heart catheterization with coronary angiogram N/A 04/05/2012    Procedure: LEFT HEART CATHETERIZATION WITH CORONARY ANGIOGRAM;  Surgeon: MicSherren MochaD;  [facility];  Service: Cardiovascular;  Laterality: N/A;    Home Medications:  Prescriptions prior to admission  Medication Sig Dispense Refill Last Dose  . ALPRAZolam (XANAX) 0.25 MG tablet Take 1 tablet (0.25 mg total) by mouth 3 (three) times daily as needed for anxiety. (Patient taking differently: Take 0.25 mg by mouth daily. ) 60 tablet 0 08/18/2014 at Unknown time  . aspirin 325 MG EC tablet Take 325 mg by mouth every morning.    08/18/2014 at Unknown time  . dexlansoprazole (DEXILANT) 60 MG capsule Take 60 mg by mouth 2 (two) times daily.   08/18/2014 at Unknown time  . ezetimibe (ZETIA) 10 MG tablet Take  1 tablet (10 mg total) by mouth daily. 90 tablet 3 08/18/2014 at Unknown time  . fluconazole (DIFLUCAN) 150 MG tablet Take 150 mg by mouth daily.   08/18/2014 at Unknown time  . furosemide (LASIX) 40 MG tablet Take 1 tablet (40 mg total) by mouth daily. 30 tablet 1 08/18/2014 at Unknown time  . iron polysaccharides (NIFEREX) 150 MG capsule Take 450 mg by mouth 2 (two) times daily.    08/18/2014 at Unknown time  . isosorbide mononitrate (IMDUR) 60 MG 24 hr tablet Take 1 tablet (60 mg total) by mouth daily. (Patient taking differently: Take 30 mg by mouth 2 (two) times daily. ) 30 tablet 1 08/18/2014 at Unknown time  . metoprolol succinate (TOPROL-XL) 50 MG 24 hr tablet Take 50 mg by mouth 2 (two) times daily. Take with or immediately following a meal.   08/18/2014 at 2000  . omega-3 acid ethyl esters (LOVAZA) 1 G capsule Take 2 g by mouth 2 (two) times daily.    08/18/2014 at Unknown time  . pioglitazone-metformin (ACTOPLUS MET) 15-850 MG per tablet Take 1 tablet by mouth daily. Brand name only-does not work if components are given separately per patient 15-850 MG   08/18/2014 at Unknown time  . PLAVIX 75 MG tablet TAKE 1 TABLET BY MOUTH ONCE DAILY WITH A MEAL 30 tablet 11 08/18/2014 at Unknown time  . potassium chloride (K-DUR) 10 MEQ tablet Take 1 tablet (10 mEq total) by  mouth 2 (two) times daily. 60 tablet 1 08/18/2014 at Unknown time  . Probiotic Product (ACIDOPHILUS PEARLS) CAPS Take 1 capsule by mouth daily.   08/18/2014 at Unknown time  . ramipril (ALTACE) 1.25 MG capsule Take 1 capsule (1.25 mg total) by mouth 2 (two) times daily. (Patient taking differently: Take 1.25 mg by mouth daily. ) 60 capsule 1 08/18/2014 at Unknown time  . rosuvastatin (CRESTOR) 10 MG tablet Take 1 tablet (10 mg total) by mouth every 7 (seven) days. Takes on Monday's (Patient taking differently: Take 10 mg by mouth every Monday. Takes on Monday's) 30 tablet 3 08/18/2014 at Unknown time  . sitaGLIPtan (JANUVIA) 100 MG tablet Take 100 mg by mouth at bedtime.    08/17/2014 at Unknown time  . sulfamethoxazole-trimethoprim (BACTRIM DS,SEPTRA DS) 800-160 MG per tablet Take 1 tablet by mouth 2 (two) times daily.   08/18/2014 at Unknown time  . Vitamin D, Ergocalciferol, (DRISDOL) 50000 UNITS CAPS Take 1 capsule (50,000 Units total) by mouth every 7 (seven) days. (Patient taking differently: Take 50,000 Units by mouth every Monday, Wednesday, and Friday. ) 30 capsule 1 08/18/2014 at Unknown time  . meperidine (DEMEROL) 50 MG tablet Take 1 tablet (50 mg total) by mouth every 6 (six) hours as needed. For pain 30 tablet 0 Taking  . nitroGLYCERIN (NITROSTAT) 0.4 MG SL tablet Place 1 tablet (0.4 mg total) under the tongue every 5 (five) minutes as needed. For chest pain 25 tablet 2 not used  . prochlorperazine (COMPAZINE) 10 MG tablet Take 1 tablet (10 mg total) by mouth every 6 (six) hours as needed. For nausea 30 tablet 2 Taking  . promethazine (PHENERGAN) 25 MG tablet   0 Taking   Allergies:  Allergies  Allergen Reactions  . Acetaminophen Nausea Only and Other (See Comments)    Does not tolerate well, nausea  . Ceftriaxone Itching  . Codeine Nausea And Vomiting  . Erythromycin Nausea And Vomiting  . Hydrocodone-Acetaminophen Nausea And Vomiting  . Hydromorphone Nausea And Vomiting  . Ibuprofen  Other (See Comments)    Does not tolerate well  . Propoxyphene Hcl Nausea And Vomiting  . Statins Other (See Comments)    Leg myalgias  . Eggs Or Egg-Derived Products Other (See Comments)    "pt does not eat"  . Shellfish-Derived Products Other (See Comments)    "pt does not eat"    Family History  Problem Relation Age of Onset  . Heart attack Mother   . Heart attack Father   . Hypertension Brother   . Diabetes Brother   . Hypertension Brother    Social History:  reports that she quit smoking about 13 years ago. Her smoking use included Cigarettes. She has a 78 pack-year smoking history. She has never used smokeless tobacco. She reports that she drinks about 8.4 oz of alcohol per week. She reports that she does not use illicit drugs.  ROS: A complete review of systems was performed.  All systems are negative except for pertinent findings as noted. ROS   Physical Exam:  Vital signs in last 24 hours: Temp:  [98 F (36.7 C)-98.2 F (36.8 C)] 98.2 F (36.8 C) (02/16 0314) Pulse Rate:  [77-107] 83 (02/16 0209) Resp:  [12-27] 14 (02/16 0314) BP: (169-202)/(66-93) 191/69 mmHg (02/16 0314) SpO2:  [92 %-100 %] 92 % (02/16 0314) Weight:  [58.922 kg (129 lb 14.4 oz)-59.875 kg (132 lb)] 58.922 kg (129 lb 14.4 oz) (02/16 0314) General:  Alert and oriented, No acute distress HEENT: Normocephalic, atraumatic Neck: No JVD or lymphadenopathy Cardiovascular: Regular rate and rhythm Lungs: Regular rate and effort Abdomen: Soft, nontender, nondistended, no abdominal masses Back: No CVA tenderness Neurologic: Grossly intact GU: foley in place - hematuria light. No clots. Clear at top of tube. Foley draining well.   Laboratory Data:  Results for orders placed or performed during the hospital encounter of 08/18/14 (from the past 24 hour(s))  CBC with Differential     Status: Abnormal   Collection Time: 08/18/14  9:10 PM  Result Value Ref Range   WBC 9.1 4.0 - 10.5 K/uL   RBC 3.85 (L)  3.87 - 5.11 MIL/uL   Hemoglobin 9.8 (L) 12.0 - 15.0 g/dL   HCT 30.8 (L) 36.0 - 46.0 %   MCV 80.0 78.0 - 100.0 fL   MCH 25.5 (L) 26.0 - 34.0 pg   MCHC 31.8 30.0 - 36.0 g/dL   RDW 18.2 (H) 11.5 - 15.5 %   Platelets 431 (H) 150 - 400 K/uL   Neutrophils Relative % 72 43 - 77 %   Neutro Abs 6.5 1.7 - 7.7 K/uL   Lymphocytes Relative 21 12 - 46 %   Lymphs Abs 1.9 0.7 - 4.0 K/uL   Monocytes Relative 6 3 - 12 %   Monocytes Absolute 0.5 0.1 - 1.0 K/uL   Eosinophils Relative 1 0 - 5 %   Eosinophils Absolute 0.1 0.0 - 0.7 K/uL   Basophils Relative 0 0 - 1 %   Basophils Absolute 0.0 0.0 - 0.1 K/uL  Basic metabolic panel     Status: Abnormal   Collection Time: 08/18/14  9:10 PM  Result Value Ref Range   Sodium 131 (L) 135 - 145 mmol/L   Potassium 4.3 3.5 - 5.1 mmol/L   Chloride 96 96 - 112 mmol/L   CO2 20 19 - 32 mmol/L   Glucose, Bld 343 (H) 70 - 99 mg/dL   BUN 17 6 - 23 mg/dL   Creatinine, Ser 1.14 (H) 0.50 - 1.10 mg/dL  Calcium 9.3 8.4 - 10.5 mg/dL   GFR calc non Af Amer 48 (L) >90 mL/min   GFR calc Af Amer 56 (L) >90 mL/min   Anion gap 15 5 - 15  Type and screen     Status: None   Collection Time: 08/18/14  9:10 PM  Result Value Ref Range   ABO/RH(D) O POS    Antibody Screen NEG    Sample Expiration 08/21/2014   CBG monitoring, ED     Status: Abnormal   Collection Time: 08/18/14  9:11 PM  Result Value Ref Range   Glucose-Capillary 341 (H) 70 - 99 mg/dL  Urinalysis, Routine w reflex microscopic     Status: Abnormal   Collection Time: 08/19/14 12:21 AM  Result Value Ref Range   Color, Urine RED (A) YELLOW   APPearance TURBID (A) CLEAR   Specific Gravity, Urine 1.031 (H) 1.005 - 1.030   pH 6.0 5.0 - 8.0   Glucose, UA 500 (A) NEGATIVE mg/dL   Hgb urine dipstick LARGE (A) NEGATIVE   Bilirubin Urine LARGE (A) NEGATIVE   Ketones, ur 40 (A) NEGATIVE mg/dL   Protein, ur >300 (A) NEGATIVE mg/dL   Urobilinogen, UA 4.0 (H) 0.0 - 1.0 mg/dL   Nitrite POSITIVE (A) NEGATIVE    Leukocytes, UA LARGE (A) NEGATIVE  Urine microscopic-add on     Status: Abnormal   Collection Time: 08/19/14 12:21 AM  Result Value Ref Range   Squamous Epithelial / LPF RARE RARE   WBC, UA 21-50 <3 WBC/hpf   RBC / HPF TOO NUMEROUS TO COUNT <3 RBC/hpf   Bacteria, UA MANY (A) RARE   Urine-Other AMORPHOUS URATES/PHOSPHATES   Troponin I     Status: Abnormal   Collection Time: 08/19/14 12:40 AM  Result Value Ref Range   Troponin I 0.04 (H) <0.031 ng/mL  Lactic acid, plasma     Status: None   Collection Time: 08/19/14  2:30 AM  Result Value Ref Range   Lactic Acid, Venous 1.9 0.5 - 2.0 mmol/L  Glucose, capillary     Status: Abnormal   Collection Time: 08/19/14  3:34 AM  Result Value Ref Range   Glucose-Capillary 380 (H) 70 - 99 mg/dL   Comment 1 Notify RN   Glucose, capillary     Status: Abnormal   Collection Time: 08/19/14  4:49 AM  Result Value Ref Range   Glucose-Capillary 396 (H) 70 - 99 mg/dL   No results found for this or any previous visit (from the past 240 hour(s)). Creatinine:  Recent Labs  08/18/14 2110  CREATININE 1.14*    Impression/Assessment/plan: Gross hematuria-multiple etiologies possible including Urinary tract infection (bacteria vs. yeast), stone passage, left UPJ obstruction.Overall patient looks stable.  Doesn't require acute urologic intervention.  Urine appears to be clearing a.m. Labs pending.  Would continue supportive care with fluids, blood sugar control and coverage for bacterial and yeast urinary tract infection while urine culture pending. Difficult to recommend specific therapy because urine cultures in our office up and nonspecific. Depending on her clinical course I might consider repeat CT of the abdomen and pelvis to assess the stones and left UPJ.  I'll notify Dr. Karsten Ro of patient's admission.  [name] 08/19/2014, 7:32 AM

## 2014-08-20 ENCOUNTER — Inpatient Hospital Stay (HOSPITAL_COMMUNITY): Payer: Medicare Other

## 2014-08-20 DIAGNOSIS — Z452 Encounter for adjustment and management of vascular access device: Secondary | ICD-10-CM

## 2014-08-20 LAB — GLUCOSE, CAPILLARY
GLUCOSE-CAPILLARY: 199 mg/dL — AB (ref 70–99)
Glucose-Capillary: 275 mg/dL — ABNORMAL HIGH (ref 70–99)
Glucose-Capillary: 298 mg/dL — ABNORMAL HIGH (ref 70–99)
Glucose-Capillary: 232 mg/dL — ABNORMAL HIGH (ref 70–99)

## 2014-08-20 LAB — CBC
HCT: 18.1 % — ABNORMAL LOW (ref 36.0–46.0)
HEMOGLOBIN: 5.8 g/dL — AB (ref 12.0–15.0)
MCH: 25.3 pg — ABNORMAL LOW (ref 26.0–34.0)
MCHC: 32 g/dL (ref 30.0–36.0)
MCV: 79 fL (ref 78.0–100.0)
Platelets: 338 10*3/uL (ref 150–400)
RBC: 2.29 MIL/uL — AB (ref 3.87–5.11)
RDW: 18.6 % — ABNORMAL HIGH (ref 11.5–15.5)
WBC: 12 10*3/uL — ABNORMAL HIGH (ref 4.0–10.5)

## 2014-08-20 LAB — BASIC METABOLIC PANEL
Anion gap: 12 (ref 5–15)
BUN: 27 mg/dL — AB (ref 6–23)
CHLORIDE: 98 mmol/L (ref 96–112)
CO2: 24 mmol/L (ref 19–32)
CREATININE: 1.7 mg/dL — AB (ref 0.50–1.10)
Calcium: 8.8 mg/dL (ref 8.4–10.5)
GFR calc Af Amer: 34 mL/min — ABNORMAL LOW (ref >90)
GFR, EST NON AFRICAN AMERICAN: 30 mL/min — AB (ref >90)
Glucose, Bld: 319 mg/dL — ABNORMAL HIGH (ref 70–99)
POTASSIUM: 4.7 mmol/L (ref 3.5–5.1)
Sodium: 134 mmol/L — ABNORMAL LOW (ref 135–145)

## 2014-08-20 LAB — URINE CULTURE
CULTURE: NO GROWTH
Colony Count: NO GROWTH

## 2014-08-20 LAB — PREPARE RBC (CROSSMATCH)

## 2014-08-20 MED ORDER — PIOGLITAZONE HCL-METFORMIN HCL 15-850 MG PO TABS
1.0000 | ORAL_TABLET | Freq: Every day | ORAL | Status: DC
  Filled 2014-08-20 (×3): qty 1

## 2014-08-20 MED ORDER — CIPROFLOXACIN HCL 500 MG PO TABS
500.0000 mg | ORAL_TABLET | Freq: Two times a day (BID) | ORAL | Status: DC
  Administered 2014-08-20 – 2014-08-21 (×4): 500 mg via ORAL
  Filled 2014-08-20 (×7): qty 1

## 2014-08-20 MED ORDER — SODIUM CHLORIDE 0.9 % IV SOLN
Freq: Once | INTRAVENOUS | Status: AC
  Administered 2014-08-20: 16:00:00 via INTRAVENOUS

## 2014-08-20 MED ORDER — WHITE PETROLATUM GEL
Status: AC
  Administered 2014-08-20: 0.2
  Filled 2014-08-20: qty 1

## 2014-08-20 MED ORDER — INSULIN GLARGINE 100 UNIT/ML ~~LOC~~ SOLN
25.0000 [IU] | Freq: Every day | SUBCUTANEOUS | Status: DC
Start: 2014-08-20 — End: 2014-08-22
  Administered 2014-08-20 – 2014-08-21 (×2): 25 [IU] via SUBCUTANEOUS
  Filled 2014-08-20 (×4): qty 0.25

## 2014-08-20 MED ORDER — PIOGLITAZONE HCL-METFORMIN HCL 15-850 MG PO TABS: 1.0000 | ORAL_TABLET | Freq: Every day | ORAL | Status: DC

## 2014-08-20 MED ORDER — FUROSEMIDE 10 MG/ML IJ SOLN
20.0000 mg | Freq: Once | INTRAMUSCULAR | Status: AC
  Administered 2014-08-20: 20 mg via INTRAVENOUS
  Filled 2014-08-20: qty 2

## 2014-08-20 MED ORDER — RAMIPRIL 1.25 MG PO CAPS
1.2500 mg | ORAL_CAPSULE | Freq: Every day | ORAL | Status: DC
Start: 2014-08-20 — End: 2014-08-24
  Administered 2014-08-20 – 2014-08-24 (×5): 1.25 mg via ORAL
  Filled 2014-08-20 (×4): qty 1

## 2014-08-20 MED ORDER — COLLAGENASE 250 UNIT/GM EX OINT
TOPICAL_OINTMENT | Freq: Every day | CUTANEOUS | Status: DC
  Administered 2014-08-20: 1 via TOPICAL
  Administered 2014-08-21 – 2014-08-22 (×2): via TOPICAL
  Administered 2014-08-23: 1 via TOPICAL
  Administered 2014-08-24: 10:00:00 via TOPICAL
  Filled 2014-08-20: qty 30

## 2014-08-20 MED ORDER — SODIUM CHLORIDE 0.9 % IJ SOLN
10.0000 mL | INTRAMUSCULAR | Status: DC | PRN
  Administered 2014-08-20: 10 mL
  Administered 2014-08-21 (×2): 20 mL
  Administered 2014-08-21 – 2014-08-24 (×4): 10 mL
  Administered 2014-08-24: 20 mL
  Filled 2014-08-20 (×8): qty 40

## 2014-08-20 NOTE — Procedures (Signed)
Central Venous Catheter Insertion Procedure Note [name] [id number] 07/07/1945  Procedure: Insertion of Central Venous Catheter Indications: Drug and/or fluid administration and Frequent blood sampling  Procedure Details Consent: Risks of procedure as well as the alternatives and risks of each were explained to the (patient/caregiver).  Consent for procedure obtained. Time Out: Verified patient identification, verified procedure, site/side was marked, verified correct patient position, special equipment/implants available, medications/allergies/relevent history reviewed, required imaging and test results available.  Performed Real time [place] was used to ID and cannulate the vessel  Maximum sterile technique was used including antiseptics, cap, gloves, gown, hand hygiene, mask and sheet. Skin prep: Chlorhexidine; local anesthetic administered A antimicrobial bonded/coated triple lumen catheter was placed in the right internal jugular vein using the Seldinger technique.  Evaluation Blood flow good Complications: No apparent complications Patient did tolerate procedure well. Chest X-ray ordered to verify placement.  CXR: pending.  Taziah Difatta,PETE 08/20/2014, 9:50 AM

## 2014-08-20 NOTE — Progress Notes (Signed)
Subjective: Refused central line last night. Missed meds due to nausea and vomiting.  Off ASA and plavix. (with the blessing of VVS) BS 289 this AM HR up, likely volume related No labs could be done  [place] discussion about seriousness of issues and need for care  NOW will get a central line. We can use this for labs and fluids (and blood if needed)  Objective: Vital signs in last 24 hours: Temp:  [98.5 F (36.9 C)-98.8 F (37.1 C)] 98.5 F (36.9 C) (02/17 0602) Pulse Rate:  [88-122] 122 (02/17 0602) Resp:  [15-18] 15 (02/17 0602) BP: (114-180)/(52-76) 139/55 mmHg (02/17 0602) SpO2:  [100 %] 100 % (02/17 0602)  Intake/Output from previous day: 02/16 0701 - 02/17 0700 In: 71062  Out: 29175 [Urine:29175] Intake/Output this shift:   Non toxic but chronically ill. Less bloody urine   Lab Results   Recent Labs  08/18/14 2110  WBC 9.1  RBC 3.85*  HGB 9.8*  HCT 30.8*  MCV 80.0  MCH 25.5*  RDW 18.2*  PLT 431*    Recent Labs  08/18/14 2110  NA 131*  K 4.3  CL 96  CO2 20  GLUCOSE 343*  BUN 17  CREATININE 1.14*  CALCIUM 9.3    Studies/Results: No results found.  Scheduled Meds: . ezetimibe  10 mg Oral Daily  . fluconazole  150 mg Oral Daily  . furosemide  40 mg Oral Daily  . insulin aspart  0-9 Units Subcutaneous TID WC  . insulin glargine  25 Units Subcutaneous Daily  . iron polysaccharides  450 mg Oral BID  . isosorbide mononitrate  30 mg Oral BID  . lactobacillus acidophilus  1 tablet Oral Daily  . linagliptin  5 mg Oral Daily  . metFORMIN  850 mg Oral Q breakfast  . metoprolol succinate  50 mg Oral BID  . omega-3 acid ethyl esters  2 g Oral BID  . pantoprazole  40 mg Oral Daily  . pioglitazone  15 mg Oral Daily  . potassium chloride  10 mEq Oral BID  . ramipril  1.25 mg Oral Daily  . rosuvastatin  10 mg Oral Q7 days  . Vitamin D (Ergocalciferol)  50,000 Units Oral Q7 days   Continuous Infusions: . sodium chloride    . sodium chloride  irrigation     PRN Meds:ALPRAZolam, meperidine, nitroGLYCERIN, prochlorperazine, promethazine  Assessment/Plan:   UTI (lower urinary tract infection):ACUTE GROSS HEMATURIA: slowing a bit, Now off ASA and plavix. To get an upper tract CT Active Problems:  Diabetes mellitus type 2 with neurological manifestations: add more insulin  Hyperlipemia: on Rx  CORONARY ATHEROSCLEROSIS NATIVE CORONARY ARTERY; doing OK, EKG non ischemic, troponin OK  Atherosclerosis of native arteries of extremity with intermittent claudication PVD WITH FINGER ULCERATIONS: still an active issue, no pain at the present. Change dressings HYPERLIPIDEMIA: on Rx PROLIFERATIVE RETINOPATHY   LOS: 1 day   [name] ALAN 08/20/2014, 8:06 AM

## 2014-08-20 NOTE — Consult Note (Signed)
WOC wound consult note  Reason for Consult: evaluation of finger tips and toe ulcer.  Pt with longstanding history of PAD and is followed by the wound care center (Dr. Wiliam Ke) and by several different MDs at [facility] and other outside facility for these wounds. She has a history of Raynaud's and has necrosis of finger tips on the right hand and the toe.  Wound type: arterial ulcerations  I did not assess these wounds after the extensive description of the MD follow up and work ups she has had.  She and her husband have a current wound care plan that includes the use of enzymatic debridement ointment, hydrogel and a non adherent dressing Dressing procedure/placement/frequency:  I will continue her home POC initiated by her wound care MD Dr. Wiliam Ke.   They have brought their home script into the hospital for the enzymatic ointment and I have requested the pharmacy to send that to the patients room for their use.  Ordered the other supplies needed.  Discussed POC with patient and bedside nurse.  Re consult if needed, will not follow at this time. Thanks  Filimon Miranda [employer], CWOCN [phone])

## 2014-08-20 NOTE — Progress Notes (Signed)
Patient ID: [name], female   DOB: 11/13/1945, 69 y.o.   MRN: [id number]  Subjective: The patient reports persistent nausea with an episode of emesis last night.  She was able to drink a little bit of water today.  She is not experiencing significant abdominal pain. She reports that the catheter that she now has is much more comfortable.  She's not having difficulty with clot retention.  She also indicated that Dr. Karlene Lineman had given the go ahead to stop her Plavix. His feeling was that it was probably not contributing a great deal. She currently does not have IV access but I believe is going to go to interventional radiology today for central line placement.  Objective: Vital signs in last 24 hours: Temp:  [98.5 F (36.9 C)-98.8 F (37.1 C)] 98.5 F (36.9 C) (02/17 0602) Pulse Rate:  [88-122] 122 (02/17 0602) Resp:  [15-18] 15 (02/17 0602) BP: (114-180)/(52-76) 139/55 mmHg (02/17 0602) SpO2:  [100 %] 100 % (02/17 0602)A  Intake/Output from previous day: 02/16 0701 - 02/17 0700 In: 62130  Out: 29175 [Urine:29175] Intake/Output this shift:    Past Medical History  Diagnosis Date  . Hypertension     Unspecified  . Hyperlipidemia     Mixed  . Tobacco abuse     Remote  . Loose, teeth     has loose bridge and two loose teeth holding it  . PONV (postoperative nausea and vomiting)   . Type II diabetes mellitus   . GERD (gastroesophageal reflux disease)   . Anxiety   . Peripheral vascular disease     unspecified, a. s/p L CEA b. s/p B fem-pop bypass  . Raynaud disease   . Stones in the urinary tract   . UTI (urinary tract infection)   . Anemia     hx  . Coronary artery disease     a. s/p NSTEMI 02/2009 - PCI LCX with Xience DES. Otherwise branch vessel and Dist. RCA dzs. NL EF.   Marland Kitchen Myocardial infarction   . CHF (congestive heart failure)   . Kidney stones   . UTI (lower urinary tract infection)   . Constipation   . Hernia, umbilical   . History of blood transfusion    . Fall from slipping on wet surface Jan. 7, 2014    Right stump bleeding from fall  . Carotid artery occlusion     Physical Exam:  Lungs - Normal respiratory effort, chest expands symmetrically.  Abdomen - Soft, non-tender & non-distended. Foley catheter is in place and draining pink urine without clots.  Lab Results:  Recent Labs  08/18/14 2110  WBC 9.1  HGB 9.8*  HCT 30.8*   BMET  Recent Labs  08/18/14 2110  NA 131*  K 4.3  CL 96  CO2 20  GLUCOSE 343*  BUN 17  CREATININE 1.14*  CALCIUM 9.3   No results for input(s): LABURIN in the last 72 hours. Results for orders placed or performed during the hospital encounter of 04/23/12  Surgical pcr screen     Status: None   Collection Time: 04/23/12 10:16 AM  Result Value Ref Range Status   MRSA, PCR NEGATIVE NEGATIVE Final   Staphylococcus aureus NEGATIVE NEGATIVE Final    Comment:        The Xpert SA Assay (FDA approved for NASAL specimens in patients over 74 years of age), is one component of a comprehensive surveillance program.  Test performance has been validated by [employer] for patients greater  than or equal to 8 year old. It is not intended to diagnose infection nor to guide or monitor treatment.    Studies/Results: No results found.  Assessment: Gross hematuria: I had performed cystoscopy recently and found no worrisome intravesical lesions at that time.  I think her hematuria is most likely lower tract however his been sometime since she's had upper track evaluation other than a renal ultrasound so I felt a hematuria protocol CT scan was indicated.  Her current urine culture remains pending.  Her gross hematuria is improved and she is not having any further clots now on CBI.  With her off Plavix I would suspect her gross hematuria will likely resolve within the next 24-48 hours.  Once that occurs she could safely be restarted on her Plavix.  History of left UPJ obstruction: She has a crossing  vessel seen on previous CT scan resulting in some mild obstruction of the kidney but it has not resulted in any pain nor loss of renal function on that side.  A recent renogram revealed 50% function bilaterally.  We have discussed the options and she has elected to continue continue with observation rather than any form of surgical intervention which I believe is entirely reasonable.  Chronic cystitis: She has a history of chronic cystitis and her bladder appeared to have inflammation consistent with this condition recently.  I had placed her on a low-dose prophylactic dose of Septra and if she had been taking that prior to admission her culture could potentially show no growth despite the presence of an acute cystitis.  Plan: 1.  Continue CBI. 2.  Upper tract evaluation with CT scan. 3.  She will remain off Plavix at least until her urine clears. 4.  Await culture results. 5.  I have started her empirically on Cipro while awaiting culture results.  [name] C 08/20/2014, 8:03 AM

## 2014-08-20 NOTE — Progress Notes (Signed)
CRITICAL VALUE ALERT  Critical value received:  HGB 5.8  Date of notification:  08/20/2014  Time of notification:  1409  Critical value read back:Yes.    Nurse who received alert:  Stann Ore  MD notified (1st page):  Dr. Evlyn Kanner  Time of first page:  1411  MD notified (2nd page):  Time of second page:  Responding MD:  Dr. Evlyn Kanner   Time MD responded:  [phone]

## 2014-08-20 NOTE — Progress Notes (Signed)
PHARMACIST - PHYSICIAN COMMUNICATION  CONCERNING:  METFORMIN SAFE ADMINISTRATION POLICY  RECOMMENDATION: Metformin has been placed on DISCONTINUE (rejected order) STATUS and should be reordered only after any of the conditions below are ruled out.  Current safety recommendations include avoiding metformin for a minimum of 48 hours after the patient's exposure to intravenous contrast media.  DESCRIPTION:  The P&T Committee has adopted a policy that restricts the use of metformin in hospitalized patients until all the contraindications to administration have been ruled out.  Specific contraindications are: [x]  Serum creatinine ? 1.5 for males []  Serum creatinine ? 1.4 for females []  Shock, acute MI, sepsis, hypoxemia, dehydration []  Planned administration of intravenous iodinated contrast media []  Heart Failure patients with low EF []  Acute or chronic metabolic acidosis (including DKA)      Baldemar Friday  08/20/2014 2:01 PM

## 2014-08-21 ENCOUNTER — Inpatient Hospital Stay (HOSPITAL_COMMUNITY): Payer: Medicare Other

## 2014-08-21 LAB — CBC
HCT: 31.9 % — ABNORMAL LOW (ref 36.0–46.0)
HEMOGLOBIN: 10.8 g/dL — AB (ref 12.0–15.0)
MCH: 26.4 pg (ref 26.0–34.0)
MCHC: 33.9 g/dL (ref 30.0–36.0)
MCV: 78 fL (ref 78.0–100.0)
Platelets: 279 10*3/uL (ref 150–400)
RBC: 4.09 MIL/uL (ref 3.87–5.11)
RDW: 16.7 % — ABNORMAL HIGH (ref 11.5–15.5)
WBC: 13 10*3/uL — ABNORMAL HIGH (ref 4.0–10.5)

## 2014-08-21 LAB — TYPE AND SCREEN
ABO/RH(D): O POS
Antibody Screen: NEGATIVE
UNIT DIVISION: 0
Unit division: 0
Unit division: 0

## 2014-08-21 LAB — GLUCOSE, CAPILLARY
GLUCOSE-CAPILLARY: 176 mg/dL — AB (ref 70–99)
GLUCOSE-CAPILLARY: 183 mg/dL — AB (ref 70–99)
Glucose-Capillary: 157 mg/dL — ABNORMAL HIGH (ref 70–99)
Glucose-Capillary: 165 mg/dL — ABNORMAL HIGH (ref 70–99)

## 2014-08-21 LAB — BASIC METABOLIC PANEL
Anion gap: 9 (ref 5–15)
BUN: 22 mg/dL (ref 6–23)
CO2: 24 mmol/L (ref 19–32)
Calcium: 8.5 mg/dL (ref 8.4–10.5)
Chloride: 102 mmol/L (ref 96–112)
Creatinine, Ser: 1.4 mg/dL — ABNORMAL HIGH (ref 0.50–1.10)
GFR calc Af Amer: 43 mL/min — ABNORMAL LOW (ref >90)
GFR calc non Af Amer: 37 mL/min — ABNORMAL LOW (ref >90)
Glucose, Bld: 261 mg/dL — ABNORMAL HIGH (ref 70–99)
Potassium: 3.7 mmol/L (ref 3.5–5.1)
Sodium: 135 mmol/L (ref 135–145)

## 2014-08-21 MED ORDER — IOHEXOL 300 MG/ML  SOLN
80.0000 mL | Freq: Once | INTRAMUSCULAR | Status: AC | PRN
  Administered 2014-08-21: 80 mL via INTRAVENOUS

## 2014-08-21 MED ORDER — SODIUM CHLORIDE 0.9 % IV BOLUS (SEPSIS)
250.0000 mL | Freq: Once | INTRAVENOUS | Status: AC
  Administered 2014-08-21: 250 mL via INTRAVENOUS

## 2014-08-21 MED ORDER — MIRABEGRON ER 50 MG PO TB24
50.0000 mg | ORAL_TABLET | Freq: Every day | ORAL | Status: DC
  Administered 2014-08-21 – 2014-08-24 (×4): 50 mg via ORAL
  Filled 2014-08-21 (×5): qty 1

## 2014-08-21 MED ORDER — BELLADONNA ALKALOIDS-OPIUM 16.2-60 MG RE SUPP
1.0000 | Freq: Four times a day (QID) | RECTAL | Status: DC | PRN
  Administered 2014-08-21 – 2014-08-24 (×7): 1 via RECTAL
  Filled 2014-08-21 (×7): qty 1

## 2014-08-21 NOTE — Progress Notes (Signed)
Subjective: Feeling better Was profoundly anemic and got 3 units PRBC Still little appetite, Feels PO cipro may be nauseating her Amazingly,no cardiac CP.  Still has hematuria   Objective: Vital signs in last 24 hours: Temp:  [97.9 F (36.6 C)-99.9 F (37.7 C)] 98.6 F (37 C) (02/18 0656) Pulse Rate:  [87-98] 95 (02/18 0053) Resp:  [16-18] 17 (02/18 0656) BP: (122-182)/(40-66) 178/66 mmHg (02/18 0656) SpO2:  [94 %-99 %] 99 % (02/18 0656)  Intake/Output from previous day: 02/17 0701 - 02/18 0700 In: 00712 [I.V.:40; Blood:1000] Out: 16875 [Urine:16875] Intake/Output this shift: Total I/O In: 315 [I.V.:315] Out: 4050 [Urine:4050]  Lying at 30 degrees, anicteric, lungs clear, ht regular sl fast. Abd sl distended, reduced BS"s. Awake,mentating well  Lab Results   Recent Labs  08/18/14 2110 08/20/14 1245  WBC 9.1 12.0*  RBC 3.85* 2.29*  HGB 9.8* 5.8*  HCT 30.8* 18.1*  MCV 80.0 79.0  MCH 25.5* 25.3*  RDW 18.2* 18.6*  PLT 431* 338    Recent Labs  08/20/14 1052 08/21/14 0908  NA 134* 135  K 4.7 3.7  CL 98 102  CO2 24 24  GLUCOSE 319* 261*  BUN 27* 22  CREATININE 1.70* 1.40*  CALCIUM 8.8 8.5    Studies/Results: Dg Chest Port 1 View  08/20/2014   CLINICAL DATA:  Nausea and vomiting, status post central line placement  EXAM: PORTABLE CHEST - 1 VIEW  COMPARISON:  None.  FINDINGS: Cardiac shadow is within normal limits. The lungs are clear. No focal effusion or pneumothorax is seen. A right jugular central line is noted with the tip in the mid superior vena cava.  IMPRESSION: No evidence of pneumothorax following central line placement. Catheter tip is in the mid superior vena cava.   Electronically Signed   By: Alcide Clever M.D.   On: 08/20/2014 10:13    Scheduled Meds: . ciprofloxacin  500 mg Oral BID  . collagenase   Topical Daily  . ezetimibe  10 mg Oral Daily  . fluconazole  150 mg Oral Daily  . furosemide  40 mg Oral Daily  . insulin aspart  0-9 Units  Subcutaneous TID WC  . insulin glargine  25 Units Subcutaneous Daily  . iron polysaccharides  450 mg Oral BID  . isosorbide mononitrate  30 mg Oral BID  . lactobacillus acidophilus  1 tablet Oral Daily  . linagliptin  5 mg Oral Daily  . metoprolol succinate  50 mg Oral BID  . mirabegron ER  50 mg Oral Daily  . omega-3 acid ethyl esters  2 g Oral BID  . pantoprazole  40 mg Oral Daily  . potassium chloride  10 mEq Oral BID  . ramipril  1.25 mg Oral Daily  . rosuvastatin  10 mg Oral Q7 days  . sodium chloride  250 mL Intravenous Once  . Vitamin D (Ergocalciferol)  50,000 Units Oral Q7 days   Continuous Infusions: . sodium chloride 75 mL/hr at 08/21/14 0024  . sodium chloride irrigation     PRN Meds:ALPRAZolam, meperidine, nitroGLYCERIN, opium-belladonna, prochlorperazine, promethazine, sodium chloride  Assessment/Plan: PROFOUND ANEMIA: now has received 3 units. CBC ordered  UTI (lower urinary tract infection):ACUTE GROSS HEMATURIA: slowing a bit, Now off ASA and plavix. To get an upper tract CT in the next hour Active Problems:  Diabetes mellitus type 2 with neurological manifestations: improved at 179  Hyperlipemia: on Rx  CORONARY ATHEROSCLEROSIS NATIVE CORONARY ARTERY; doing OK, EKG non ischemic, troponin OK  Atherosclerosis of native  arteries of extremity with intermittent claudication PVD WITH FINGER ULCERATIONS: still an active issue, no pain at the present. Change dressings HYPERLIPIDEMIA: on Rx PROLIFERATIVE RETINOPATHY  CANNOT USE COMPRESSION STOCKINGS ON REMAINING LEG DUE TO PROFOUND PVD, Already has a toe ulcer on this side and has gangrene of 3 fingertips   LOS: 2 days   Kingstin Heims ALAN 08/21/2014, 11:57 AM

## 2014-08-21 NOTE — Progress Notes (Signed)
Patient ID: [name], female   DOB: 11-09-1945, 69 y.o.   MRN: [id number]  Subjective: The patient reports that her primary urologic complaint is that of leakage around her catheter.  Her husband seem to think it had something to do with the catheter that Dr. Mena Goes had inserted. She reported having passed a few small clots overnight. She reports no flank pain.  Objective: Vital signs in last 24 hours: Temp:  [97.9 F (36.6 C)-99.9 F (37.7 C)] 98.6 F (37 C) (02/18 0656) Pulse Rate:  [87-126] 95 (02/18 0053) Resp:  [16-18] 17 (02/18 0656) BP: (110-182)/(40-66) 178/66 mmHg (02/18 0656) SpO2:  [94 %-99 %] 99 % (02/18 0656)A  Intake/Output from previous day: 02/17 0701 - 02/18 0700 In: 08657 [I.V.:40; Blood:1000] Out: 16875 [Urine:16875] Intake/Output this shift:    Past Medical History  Diagnosis Date  . Hypertension     Unspecified  . Hyperlipidemia     Mixed  . Tobacco abuse     Remote  . Loose, teeth     has loose bridge and two loose teeth holding it  . PONV (postoperative nausea and vomiting)   . Type II diabetes mellitus   . GERD (gastroesophageal reflux disease)   . Anxiety   . Peripheral vascular disease     unspecified, a. s/p L CEA b. s/p B fem-pop bypass  . Raynaud disease   . Stones in the urinary tract   . UTI (urinary tract infection)   . Anemia     hx  . Coronary artery disease     a. s/p NSTEMI 02/2009 - PCI LCX with Xience DES. Otherwise branch vessel and Dist. RCA dzs. NL EF.   Marland Kitchen Myocardial infarction   . CHF (congestive heart failure)   . Kidney stones   . UTI (lower urinary tract infection)   . Constipation   . Hernia, umbilical   . History of blood transfusion   . Fall from slipping on wet surface Jan. 7, 2014    Right stump bleeding from fall  . Carotid artery occlusion     Physical Exam:  Lungs - Normal respiratory effort, chest expands symmetrically.  Abdomen - Soft, non-tender & non-distended. The Foley catheter is draining  properly on CBI and the drainage is pink tinged without clots this morning  Lab Results:  Recent Labs  08/18/14 2110 08/20/14 1245  WBC 9.1 12.0*  HGB 9.8* 5.8*  HCT 30.8* 18.1*   BMET  Recent Labs  08/18/14 2110 08/20/14 1052  NA 131* 134*  K 4.3 4.7  CL 96 98  CO2 20 24  GLUCOSE 343* 319*  BUN 17 27*  CREATININE 1.14* 1.70*  CALCIUM 9.3 8.8   No results for input(s): LABURIN in the last 72 hours. Results for orders placed or performed during the hospital encounter of 08/18/14  Urine culture     Status: None   Collection Time: 08/19/14 12:21 AM  Result Value Ref Range Status   Specimen Description URINE, CATHETERIZED  Final   Special Requests NONE  Final   Colony Count NO GROWTH Performed at [facility]   Final   Culture NO GROWTH Performed at [facility]   Final   Report Status 08/20/2014 FINAL  Final    Studies/Results: Dg Chest Port 1 View  08/20/2014   CLINICAL DATA:  Nausea and vomiting, status post central line placement  EXAM: PORTABLE CHEST - 1 VIEW  COMPARISON:  None.  FINDINGS: Cardiac shadow is within normal limits.  The lungs are clear. No focal effusion or pneumothorax is seen. A right jugular central line is noted with the tip in the mid superior vena cava.  IMPRESSION: No evidence of pneumothorax following central line placement. Catheter tip is in the mid superior vena cava.   Electronically Signed   By: Alcide Clever M.D.   On: 08/20/2014 10:13    Assessment: Gross hematuria - she continues to have mild gross hematuria on continuous bladder irrigation.  I would like to obtain upper tract studies however she did not have her CT scan is ordered because she had no IV access at the time.  Repeat creatinine now has shown some elevation.  This may be in part due to volume as her BUN was elevated.  I would like to obtain the CT scan and will therefore recheck a BMP this morning.  Her urine culture showed no growth however she confirmed that  she was taking Septra b.i.d. prior to coming in to the hospital and this may have suppressed growth on her culture. It is also possible that her hematuria is due to her chronic bladder inflammation.  Once her hematuria decreases I will try stopping her bladder irrigation and get her catheter out as soon as possible as this certainly could also serve as a source of irritation to her chronically inflamed bladder.  Incontinence -  The leakage she is experiencing around her catheter is not due to any defect in the catheter or the balloon as her husband had theorized.This can occur if the catheter is occluded but it is draining properly and therefore is due to bladder spasms.  I will begin pharmacologic therapy for this.  Plan: 1.  Obtain BMP this morning. 2.  I will contact radiology regarding rescheduling her CT scan and possibly performing this with a reduced dose of contrast due to her elevated creatinine. 3.  Continue Foley catheter drainage with CBI for now. 4.  Begin scheduled Myrbetriq 50 mg with B&O suppositories used p.r.n. For bladder spasms and leakage around her catheter.   Garnett Farm 08/21/2014, 7:33 AM

## 2014-08-21 NOTE — Progress Notes (Signed)
Pt's foley cath.been leaking on & off.Dr.Fergusson made aware.Said they'll check it out this am.

## 2014-08-21 NOTE — Progress Notes (Signed)
B/p =182/55;Dr.Patterson who is covering for Dr.South made aware ;no further orders received.

## 2014-08-22 LAB — GLUCOSE, CAPILLARY
GLUCOSE-CAPILLARY: 91 mg/dL (ref 70–99)
GLUCOSE-CAPILLARY: 183 mg/dL — AB (ref 70–99)
Glucose-Capillary: 60 mg/dL — ABNORMAL LOW (ref 70–99)
Glucose-Capillary: 113 mg/dL — ABNORMAL HIGH (ref 70–99)
Glucose-Capillary: 152 mg/dL — ABNORMAL HIGH (ref 70–99)

## 2014-08-22 LAB — CBC
HEMATOCRIT: 28.7 % — AB (ref 36.0–46.0)
Hemoglobin: 9.6 g/dL — ABNORMAL LOW (ref 12.0–15.0)
MCH: 26.3 pg (ref 26.0–34.0)
MCHC: 33.4 g/dL (ref 30.0–36.0)
MCV: 78.6 fL (ref 78.0–100.0)
PLATELETS: 261 10*3/uL (ref 150–400)
RBC: 3.65 MIL/uL — AB (ref 3.87–5.11)
RDW: 16.9 % — ABNORMAL HIGH (ref 11.5–15.5)
WBC: 19.6 10*3/uL — AB (ref 4.0–10.5)

## 2014-08-22 MED ORDER — INSULIN GLARGINE 100 UNIT/ML ~~LOC~~ SOLN
22.0000 [IU] | Freq: Every day | SUBCUTANEOUS | Status: DC
  Administered 2014-08-22 – 2014-08-23 (×2): 22 [IU] via SUBCUTANEOUS
  Filled 2014-08-22 (×3): qty 0.22

## 2014-08-22 MED ORDER — CIPROFLOXACIN IN D5W 400 MG/200ML IV SOLN
400.0000 mg | Freq: Two times a day (BID) | INTRAVENOUS | Status: DC
  Administered 2014-08-22 – 2014-08-24 (×5): 400 mg via INTRAVENOUS
  Filled 2014-08-22 (×6): qty 200

## 2014-08-22 NOTE — Progress Notes (Signed)
Hypoglycemic Event  CBG: 60  Treatment: 15 GM carbohydrate snack  Symptoms: None  Follow-up CBG: Time:1730 CBG Result:113  Possible Reasons for Event: Inadequate meal intake   Comments/MD notified:    Al Decant  Remember to initiate Hypoglycemia Order Set & complete

## 2014-08-22 NOTE — Care Management Note (Unsigned)
    Page 1 of 1   08/22/2014     3:41:38 PM CARE MANAGEMENT NOTE 08/22/2014  Patient:  [name], [name]   Account Number:  [id number]  Date Initiated:  08/22/2014  Documentation initiated by:  Letha Cape  Subjective/Objective Assessment:   dx vag bleeding  admit- lives with spouse.     Action/Plan:   continous irrigation   Anticipated DC Date:  08/23/2014   Anticipated DC Plan:  HOME/SELF CARE      DC Planning Services  CM consult      Choice offered to / List presented to:             Status of service:  In process, will continue to follow Medicare Important Message given?  YES (If response is "NO", the following Medicare IM given date fields will be blank) Date Medicare IM given:  08/22/2014 Medicare IM given by:  Letha Cape Date Additional Medicare IM given:   Additional Medicare IM given by:    Discharge Disposition:    Per UR Regulation:  Reviewed for med. necessity/level of care/duration of stay  If discussed at Long Length of Stay Meetings, dates discussed:    Comments:  08/22/14 1539 Letha Cape RN, BSN [phone] patient lives with spouse, she is on continous irrigation for vag bleeding.  Spouse asked if botox would be covered under patient's insurance , NCM sent in benefit check for patient.

## 2014-08-22 NOTE — Progress Notes (Signed)
Patient ID: [name], female   DOB: 02-25-1946, 69 y.o.   MRN: [id number]  Subjective: The patient reports that she is currently not experiencing any flank or suprapubic pain. She did have some left flank discomfort last night. The leakage around the catheter has stopped with the use of Myrbetriq.  Objective: Vital signs in last 24 hours: Temp:  [99.1 F (37.3 C)-99.3 F (37.4 C)] 99.1 F (37.3 C) (02/18 2236) Pulse Rate:  [129] 129 (02/18 2236) Resp:  [16-20] 20 (02/18 2236) BP: (137-203)/(61-80) 137/61 mmHg (02/18 2236) SpO2:  [97 %-98 %] 98 % (02/18 2236)A  Intake/Output from previous day: 02/18 0701 - 02/19 0700 In: 850 [I.V.:850] Out: 28768 [Urine:17750] Intake/Output this shift:    Past Medical History  Diagnosis Date  . Hypertension     Unspecified  . Hyperlipidemia     Mixed  . Tobacco abuse     Remote  . Loose, teeth     has loose bridge and two loose teeth holding it  . PONV (postoperative nausea and vomiting)   . Type II diabetes mellitus   . GERD (gastroesophageal reflux disease)   . Anxiety   . Peripheral vascular disease     unspecified, a. s/p L CEA b. s/p B fem-pop bypass  . Raynaud disease   . Stones in the urinary tract   . UTI (urinary tract infection)   . Anemia     hx  . Coronary artery disease     a. s/p NSTEMI 02/2009 - PCI LCX with Xience DES. Otherwise branch vessel and Dist. RCA dzs. NL EF.   Marland Kitchen Myocardial infarction   . CHF (congestive heart failure)   . Kidney stones   . UTI (lower urinary tract infection)   . Constipation   . Hernia, umbilical   . History of blood transfusion   . Fall from slipping on wet surface Jan. 7, 2014    Right stump bleeding from fall  . Carotid artery occlusion     Physical Exam:  Lungs - Normal respiratory effort, chest expands symmetrically.  Abdomen - Soft, non-tender & non-distended.  Lab Results:  Recent Labs  08/20/14 1245 08/21/14 1230 08/22/14 0450  WBC 12.0* 13.0* 19.6*  HGB 5.8*  10.8* 9.6*  HCT 18.1* 31.9* 28.7*   BMET  Recent Labs  08/20/14 1052 08/21/14 0908  NA 134* 135  K 4.7 3.7  CL 98 102  CO2 24 24  GLUCOSE 319* 261*  BUN 27* 22  CREATININE 1.70* 1.40*  CALCIUM 8.8 8.5   No results for input(s): LABURIN in the last 72 hours. Results for orders placed or performed during the hospital encounter of 08/18/14  Urine culture     Status: None   Collection Time: 08/19/14 12:21 AM  Result Value Ref Range Status   Specimen Description URINE, CATHETERIZED  Final   Special Requests NONE  Final   Colony Count NO GROWTH Performed at [facility]   Final   Culture NO GROWTH Performed at [facility]   Final   Report Status 08/20/2014 FINAL  Final    Studies/Results: Ct Abdomen Pelvis W Wo Contrast  08/21/2014   CLINICAL DATA:  Gross hematuria, left UPJ stenosis  EXAM: CT ABDOMEN AND PELVIS WITHOUT AND WITH CONTRAST  TECHNIQUE: Multidetector CT imaging of the abdomen and pelvis was performed following the standard protocol before and following the bolus administration of intravenous contrast.  CONTRAST:  75 mL OMNIPAQUE IOHEXOL 300 MG/ML  SOLN  COMPARISON:  CTA abdomen/pelvis dated 08/22/2011  FINDINGS: Lower chest:  Lung bases are clear.  Hepatobiliary: Mildly nodular contour of the left hepatic lobe, suggesting cirrhosis. 9 mm cyst in the lateral segment left hepatic lobe, unchanged.  Layering gallstones (series 2/ image 29). No intrahepatic or extrahepatic ductal dilatation.  Pancreas: Within normal limits.  Spleen: Within normal limits.  Adrenals/Urinary Tract: Adrenal glands are unremarkable.  Linear calcifications along the right kidney are favored to reflect renal vascular calcifications. Mild right hydronephrosis with extrarenal pelvis. Nonenhancing hyperdense soft tissue in the right renal pelvis reflects clot (series 2/ image 31).  Left kidney is notable for left renal vascular calcifications with suspected superimposed nonobstructing  renal calculi measuring up to 5 mm (series 2/ image 27). Chronic severe hydronephrosis with UPJ stenosis, progressed since 2013. Layering high density within the collecting system likely reflects debris/hemorrhage, without evidence of enhancement.  No ureteral or bladder calculi. On delayed imaging, the bilateral ureters and bladder do not opacify.  Thick-walled bladder with gas an indwelling Foley catheter. Additional nonenhancing clot within the bladder. No definite soft tissue mass.  Stomach/Bowel: Stomach is unremarkable.  No evidence of bowel obstruction.  Normal appendix.  Extensive colonic diverticulosis, without evidence of diverticulitis.  Moderate colonic stool burden, suggesting constipation.  Vascular/Lymphatic: Extensive atherosclerotic calcifications of the abdominal aorta and branch vessels.  Aorto bi-iliac stent.  Reproductive: Status post hysterectomy.  No adnexal masses.  Other: No abdominopelvic ascites.  Musculoskeletal: Mild degenerative changes of the visualized thoracolumbar spine.  IMPRESSION: Nonobstructing left renal calculi measuring up to 5 mm. No ureteral or bladder calculi.  Hemorrhage/clot within the right renal collecting system and bladder. Additional hemorrhage/debris within the left renal collecting system. No enhancing soft tissue/mass is visualized.  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 Mild right hydronephrosis with extrarenal pelvis.  Mildly thick-walled bladder with indwelling Foley catheter.   Electronically Signed   By: Charline Bills M.D.   On: 08/21/2014 13:41    Assessment: 1. Gross hematuria - I had a long discussion with the patient and her husband this morning. She was found on CT scan to have clots in both the right and left renal pelvis as well as in the bladder. The fact that she has clots in not only her left kidney with the UPJ stenosis/obstruction but also in the contralateral kidney without any evidence of anatomic pathology would  indicate that her bleeding has, from both kidneys due to a pharmacologic coagulopathy. I told them that the Plavix she was taking seemed to of cause this although this is not something that is typically seen with this medication. Unfortunately due to the long half-life of Plavix it will be about 7 days since it was stopped before this medication has completely cleared from her system. I also have informed her that she does have clots in both the right and left collecting systems and this means that over time the clots will begin to lyse and should pass although she could develop clot colic from time to time and I think this is more prone to occur on the left side due to her UPJ stenosis. Eventually once she has cleared all the clot from her upper tract and from her bladder where there was still some clot seen on CT she will likely benefit from some form of anticoagulation although it is clear Plavix would not be the ideal agent for her in the future. I told her that this could likely be restarted once her urine has completely cleared.  Finally I told her that I did not believe she was having bleeding from the bladder despite the fact she does have a history of significant bladder irritation/inflammation. It appears it is all been upper tract bleeding. Because it is due to a coagulopathy and bilateral embolization is clearly not an option. Observation with supportive measures such as transfusions are what I believe are indicated at this time.  2. 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 I don't know if that is some acute worsening due to some partial obstruction from blood and blood clots but she was studied with a renogram last month which revealed essentially equal function bilaterally so despite the appearance on CT scan it does not appear this has significantly affected her renal  function. We had discussed the options for treatment of this in the past and she had elected to not proceed with surgical intervention at that time. That clearly not indicated presently although she does understand that if decline in renal function on the left side is documented on follow-up renograms then she would need to consider undergoing surgical correction with pyeloplasty. For right now I do not believe there is any indication for stent placement.  3. Urinary incontinence - she was leaking around her catheter and this has proven to be due to bladder spasms as it has resolved with the use of Myrbetriq.  Plan: 1. I will stop her CBI this morning. 2. She may require intermittent catheter irrigation so her catheter will be left in place. 3. Serial H&H with transfusion as necessary. 4. Continue Myrbetriq with B&O suppositories for breakthrough bladder spasms/pericatheter leakage.  Total time spent in face-to-face consultation with the patient and her husband today was approximately 25 minutes.  Amond Speranza C 08/22/2014, 7:26 AM

## 2014-08-22 NOTE — Progress Notes (Signed)
Subjective: Doing better. No CP. No SOB. Some LLQ abd pain has improved    Objective: Vital signs in last 24 hours: Temp:  [99.1 F (37.3 C)-99.3 F (37.4 C)] 99.1 F (37.3 C) (02/18 2236) Pulse Rate:  [129] 129 (02/18 2236) Resp:  [16-20] 20 (02/18 2236) BP: (137-203)/(61-80) 137/61 mmHg (02/18 2236) SpO2:  [97 %-98 %] 98 % (02/18 2236)  Intake/Output from previous day: 02/18 0701 - 02/19 0700 In: 850 [I.V.:850] Out: 09326 [Urine:17750] Intake/Output this shift:    Lying flat, anicteric, lungs clear, ht regular, sl fast. abd soft NT, left toe looks great. In good spirits  Lab Results   Recent Labs  08/21/14 1230 08/22/14 0450  WBC 13.0* 19.6*  RBC 4.09 3.65*  HGB 10.8* 9.6*  HCT 31.9* 28.7*  MCV 78.0 78.6  MCH 26.4 26.3  RDW 16.7* 16.9*  PLT 279 261    Recent Labs  08/20/14 1052 08/21/14 0908  NA 134* 135  K 4.7 3.7  CL 98 102  CO2 24 24  GLUCOSE 319* 261*  BUN 27* 22  CREATININE 1.70* 1.40*  CALCIUM 8.8 8.5    Studies/Results: Ct Abdomen Pelvis W Wo Contrast  08/21/2014   CLINICAL DATA:  Gross hematuria, left UPJ stenosis  EXAM: CT ABDOMEN AND PELVIS WITHOUT AND WITH CONTRAST  TECHNIQUE: Multidetector CT imaging of the abdomen and pelvis was performed following the standard protocol before and following the bolus administration of intravenous contrast.  CONTRAST:  75 mL OMNIPAQUE IOHEXOL 300 MG/ML  SOLN  COMPARISON:  CTA abdomen/pelvis dated 08/22/2011  FINDINGS: Lower chest:  Lung bases are clear.  Hepatobiliary: Mildly nodular contour of the left hepatic lobe, suggesting cirrhosis. 9 mm cyst in the lateral segment left hepatic lobe, unchanged.  Layering gallstones (series 2/ image 29). No intrahepatic or extrahepatic ductal dilatation.  Pancreas: Within normal limits.  Spleen: Within normal limits.  Adrenals/Urinary Tract: Adrenal glands are unremarkable.  Linear calcifications along the right kidney are favored to reflect renal vascular calcifications.  Mild right hydronephrosis with extrarenal pelvis. Nonenhancing hyperdense soft tissue in the right renal pelvis reflects clot (series 2/ image 31).  Left kidney is notable for left renal vascular calcifications with suspected superimposed nonobstructing renal calculi measuring up to 5 mm (series 2/ image 27). Chronic severe hydronephrosis with UPJ stenosis, progressed since 2013. Layering high density within the collecting system likely reflects debris/hemorrhage, without evidence of enhancement.  No ureteral or bladder calculi. On delayed imaging, the bilateral ureters and bladder do not opacify.  Thick-walled bladder with gas an indwelling Foley catheter. Additional nonenhancing clot within the bladder. No definite soft tissue mass.  Stomach/Bowel: Stomach is unremarkable.  No evidence of bowel obstruction.  Normal appendix.  Extensive colonic diverticulosis, without evidence of diverticulitis.  Moderate colonic stool burden, suggesting constipation.  Vascular/Lymphatic: Extensive atherosclerotic calcifications of the abdominal aorta and branch vessels.  Aorto bi-iliac stent.  Reproductive: Status post hysterectomy.  No adnexal masses.  Other: No abdominopelvic ascites.  Musculoskeletal: Mild degenerative changes of the visualized thoracolumbar spine.  IMPRESSION: Nonobstructing left renal calculi measuring up to 5 mm. No ureteral or bladder calculi.  Hemorrhage/clot within the right renal collecting system and bladder. Additional hemorrhage/debris within the left renal collecting system. No enhancing soft tissue/mass is visualized.  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 Mild right hydronephrosis with extrarenal pelvis.  Mildly thick-walled bladder with indwelling Foley catheter.   Electronically Signed   By: Charline Bills M.D.   On: 08/21/2014 13:41  Dg Chest Port 1 View  08/20/2014   CLINICAL DATA:  Nausea and vomiting, status post central line placement  EXAM: PORTABLE CHEST - 1 VIEW   COMPARISON:  None.  FINDINGS: Cardiac shadow is within normal limits. The lungs are clear. No focal effusion or pneumothorax is seen. A right jugular central line is noted with the tip in the mid superior vena cava.  IMPRESSION: No evidence of pneumothorax following central line placement. Catheter tip is in the mid superior vena cava.   Electronically Signed   By: Alcide Clever M.D.   On: 08/20/2014 10:13    Scheduled Meds: . ciprofloxacin  500 mg Oral BID  . collagenase   Topical Daily  . ezetimibe  10 mg Oral Daily  . fluconazole  150 mg Oral Daily  . furosemide  40 mg Oral Daily  . insulin aspart  0-9 Units Subcutaneous TID WC  . insulin glargine  25 Units Subcutaneous Daily  . iron polysaccharides  450 mg Oral BID  . isosorbide mononitrate  30 mg Oral BID  . lactobacillus acidophilus  1 tablet Oral Daily  . linagliptin  5 mg Oral Daily  . metoprolol succinate  50 mg Oral BID  . mirabegron ER  50 mg Oral Daily  . omega-3 acid ethyl esters  2 g Oral BID  . pantoprazole  40 mg Oral Daily  . potassium chloride  10 mEq Oral BID  . ramipril  1.25 mg Oral Daily  . rosuvastatin  10 mg Oral Q7 days  . Vitamin D (Ergocalciferol)  50,000 Units Oral Q7 days   Continuous Infusions: . sodium chloride 75 mL/hr at 08/21/14 2320  . sodium chloride irrigation     PRN Meds:ALPRAZolam, meperidine, nitroGLYCERIN, opium-belladonna, prochlorperazine, promethazine, sodium chloride  Assessment/Plan:  PROFOUND ANEMIA: was 10+ now 9.6 UTI (lower urinary tract infection):ACUTE GROSS HEMATURIA: see urology note Active Problems:  Diabetes mellitus type 2 with neurological manifestations: a bit low at 91, back off a little  Hyperlipemia: on Rx  CORONARY ATHEROSCLEROSIS NATIVE CORONARY ARTERY; doing OK, EKG non ischemic, troponin OK  Atherosclerosis of native arteries of extremity with intermittent claudication PVD WITH FINGER ULCERATIONS: still an active issue, no pain at the present. Change  dressings HYPERLIPIDEMIA: on Rx PROLIFERATIVE RETINOPATHY  CANNOT USE COMPRESSION STOCKINGS ON REMAINING LEG DUE TO PROFOUND PVD, Already has a toe ulcer on this side and has gangrene of 3 fingertips   LOS: 3 days   Sohil Timko ALAN 08/22/2014, 8:34 AM

## 2014-08-23 LAB — CBC
HCT: 25.9 % — ABNORMAL LOW (ref 36.0–46.0)
Hemoglobin: 8.6 g/dL — ABNORMAL LOW (ref 12.0–15.0)
MCH: 26.1 pg (ref 26.0–34.0)
MCHC: 33.2 g/dL (ref 30.0–36.0)
MCV: 78.7 fL (ref 78.0–100.0)
PLATELETS: 225 10*3/uL (ref 150–400)
RBC: 3.29 MIL/uL — ABNORMAL LOW (ref 3.87–5.11)
RDW: 17.3 % — AB (ref 11.5–15.5)
WBC: 10.6 10*3/uL — AB (ref 4.0–10.5)

## 2014-08-23 LAB — HEMOGLOBIN AND HEMATOCRIT, BLOOD
HEMATOCRIT: 26 % — AB (ref 36.0–46.0)
HEMOGLOBIN: 8.6 g/dL — AB (ref 12.0–15.0)

## 2014-08-23 LAB — GLUCOSE, CAPILLARY
GLUCOSE-CAPILLARY: 138 mg/dL — AB (ref 70–99)
Glucose-Capillary: 83 mg/dL (ref 70–99)
Glucose-Capillary: 163 mg/dL — ABNORMAL HIGH (ref 70–99)
Glucose-Capillary: 212 mg/dL — ABNORMAL HIGH (ref 70–99)

## 2014-08-23 MED ORDER — GLUCERNA SHAKE PO LIQD: 237.0000 mL | Freq: Two times a day (BID) | ORAL | Status: DC

## 2014-08-23 MED ORDER — PRO-STAT SUGAR FREE PO LIQD
30.0000 mL | Freq: Two times a day (BID) | ORAL | Status: DC
  Filled 2014-08-23 (×2): qty 30

## 2014-08-23 NOTE — Progress Notes (Signed)
INITIAL NUTRITION ASSESSMENT  DOCUMENTATION CODES Per approved criteria  -Severe malnutrition in the context of acute illness    Pt meets criteria for severe MALNUTRITION in the context of acute illness as evidenced by energy intake <50% for >/= 5 days and moderate loss of body fat (orbital and thoracic areas) and muscle mass (patellar and clavicle regions).  INTERVENTION: ProStat 30 ml TID (each 30 ml provides 100 kcal, 15 gr protein)   Glucerna Shake po TID, each supplement provides 220 kcal and 10 grams of protein   NUTRITION DIAGNOSIS: Inadequate oral intake related to UTI as evidenced by 0% meal intake past 5 days  Goal: Pt to meet >/= 90% of their estimated nutrition needs    Monitor:  Po intake (meals and supplements), labs and wt trends   Reason for Assessment: poor po's  69 y.o. female  Admitting Dx: UTI (lower urinary tract infection)  ASSESSMENT: Pt po intake very poor since admission (0-10%) day 5. She is tolerating graham crackers today. Hyperglycemia noted. Family is present and says they are going to purchase outside food for her to try.  She has UTI and hx of nephrolithiasis with multiple rounds of antibiotics. Infection and antibiotics may be affecting her desire to eat. She has weight loss but is not significant for timeframe. Emphasized to her the importance of nutrition intake as part of her care progression.  Finally, pt has not had a BM since 2/15. This may also be contributing to her decreaed appetite.  Nutrition Focused Physical Exam:  Subcutaneous Fat:  Orbital Region: moderate  depletion Upper Arm Region: mild depletion Thoracic and Lumbar Region: WDL  Muscle:  Temple Region: moderate wasting Clavicle Bone Region: moderte wasting Clavicle and Acromion Bone Region: mild wasting Scapular Bone Region: WDL Dorsal Hand: unable to assess Patellar Region: moderate wasting Anterior Thigh Region: moderte wasting Posterior Calf Region: mild  wasting  Edema: none  Height: Ht Readings from Last 1 Encounters:  08/19/14 5' 4.8" (1.646 m)    Weight: Wt Readings from Last 1 Encounters:  08/19/14 129 lb 14.4 oz (58.922 kg)    Ideal Body Weight: 125#  % Ideal Body Weight: 104%  Wt Readings from Last 10 Encounters:  08/19/14 129 lb 14.4 oz (58.922 kg)  07/08/14 134 lb (60.782 kg)  05/16/14 139 lb 12.8 oz (63.413 kg)  04/01/14 138 lb (62.596 kg)  12/05/13 142 lb (64.411 kg)  09/03/13 137 lb (62.143 kg)  08/06/13 137 lb (62.143 kg)  06/11/13 137 lb (62.143 kg)  04/02/13 139 lb 12.8 oz (63.413 kg)  01/22/13 134 lb 3.2 oz (60.873 kg)    Usual Body Weight: 132#  % Usual Body Weight: 98%  BMI:  Body mass index is 21.75 kg/(m^2). normal range  Estimated Nutritional Needs: Kcal: 1600-1800 Protein: 77-83 gr Fluid: 1.6-1.8 liters daily  Skin: intact  Diet Order: Diet Carb Modified  EDUCATION NEEDS: -No education needs identified at this time   Intake/Output Summary (Last 24 hours) at 08/23/14 1325 Last data filed at 08/23/14 1210  Gross per 24 hour  Intake 1901.25 ml  Output   6650 ml  Net -4748.75 ml    Last BM: 08/18/14  Labs:   Recent Labs Lab 08/18/14 2110 08/20/14 1052 08/21/14 0908  NA 131* 134* 135  K 4.3 4.7 3.7  CL 96 98 102  CO2 BUN 17 27* 22  CREATININE 1.14* 1.70* 1.40*  CALCIUM 9.3 8.8 8.5  GLUCOSE 343* 319* 261*  CBG (last 3)   Recent Labs  08/22/14 2211 08/23/14 0757 08/23/14 1150  GLUCAP 152* 83 163*    Scheduled Meds: . ciprofloxacin  400 mg Intravenous Q12H  . collagenase   Topical Daily  . ezetimibe  10 mg Oral Daily  . fluconazole  150 mg Oral Daily  . furosemide  40 mg Oral Daily  . insulin aspart  0-9 Units Subcutaneous TID WC  . insulin glargine  22 Units Subcutaneous Daily  . iron polysaccharides  450 mg Oral BID  . isosorbide mononitrate  30 mg Oral BID  . lactobacillus acidophilus  1 tablet Oral Daily  . linagliptin  5 mg Oral Daily  .  metoprolol succinate  50 mg Oral BID  . mirabegron ER  50 mg Oral Daily  . omega-3 acid ethyl esters  2 g Oral BID  . pantoprazole  40 mg Oral Daily  . potassium chloride  10 mEq Oral BID  . ramipril  1.25 mg Oral Daily  . rosuvastatin  10 mg Oral Q7 days  . Vitamin D (Ergocalciferol)  50,000 Units Oral Q7 days    Continuous Infusions: . sodium chloride 50 mL/hr at 08/23/14 1018  . sodium chloride irrigation      Past Medical History  Diagnosis Date  . Hypertension     Unspecified  . Hyperlipidemia     Mixed  . Tobacco abuse     Remote  . Loose, teeth     has loose bridge and two loose teeth holding it  . PONV (postoperative nausea and vomiting)   . Type II diabetes mellitus   . GERD (gastroesophageal reflux disease)   . Anxiety   . Peripheral vascular disease     unspecified, a. s/p L CEA b. s/p B fem-pop bypass  . Raynaud disease   . Stones in the urinary tract   . UTI (urinary tract infection)   . Anemia     hx  . Coronary artery disease     a. s/p NSTEMI 02/2009 - PCI LCX with Xience DES. Otherwise branch vessel and Dist. RCA dzs. NL EF.   Marland Kitchen Myocardial infarction   . CHF (congestive heart failure)   . Kidney stones   . UTI (lower urinary tract infection)   . Constipation   . Hernia, umbilical   . History of blood transfusion   . Fall from slipping on wet surface Jan. 7, 2014    Right stump bleeding from fall  . Carotid artery occlusion     Past Surgical History  Procedure Laterality Date  . Vesicovaginal fistula closure w/ tah    . Colonoscopy  11/2010  . Upper gastrointestinal endoscopy  11/2010  . Femoral artery - popliteal artery bypass graft      left  . Abdominal hysterectomy      complete  . Femoral-popliteal bypass graft  08/22/2011    Procedure: BYPASS GRAFT FEMORAL-POPLITEAL ARTERY;  Surgeon: Gretta Began, MD;  Location: [facility] OR;  Service: Vascular;  Laterality: Right;  Thrombectomy and Revision using 81mm x 10cm stretch goretex graft  . Intraoperative  arteriogram  08/22/2011    Procedure: INTRA OPERATIVE ARTERIOGRAM;  Surgeon: Gretta Began, MD;  Location: [facility] OR;  Service: Vascular;  Laterality: Right;  to lower leg  . Multiple tooth extractions  08-29-2011    5 teeth extracted   . Pci  01/17/12    RLE  . Breast lumpectomy      right  . Tonsillectomy and adenoidectomy    .  Coronary angioplasty with stent placement    . Carotid endarterectomy  ~ 2005    left  . Aorta - bilateral femoral artery bypass graft  03/26/2012    Procedure: AORTA BIFEMORAL BYPASS GRAFT;  Surgeon: Larina Earthly, MD;  Location: [facility] OR;  Service: Vascular;  Laterality: N/A;  Aortic-bifemoral bypass using 14x78mm Hemashield graft .   . Umbilical hernia repair  03/26/2012    Procedure: HERNIA REPAIR UMBILICAL ADULT;  Surgeon: Larina Earthly, MD;  Location: [facility] OR;  Service: Vascular;  Laterality: N/A;  Removal of Umbilical hernia sac  . Femoral artery exploration  03/26/2012    Procedure: FEMORAL ARTERY EXPLORATION;  Surgeon: Larina Earthly, MD;  Location: [facility] OR;  Service: Vascular;  Laterality: Right;  with Revision of Popliteal-Peroneal bypass graft using 6mm x 10cm thin wall goretex graft  . Amputation  04/23/2012    Procedure: AMPUTATION BELOW KNEE;  Surgeon: Larina Earthly, MD;  Location: [facility] OR;  Service: Vascular;  Laterality: Right;  . Eye surgery Right Feb. 9, 2015    Cataract  . Eye surgery Left Feb. 16, 2015    Cataract  . Abdominal aortagram N/A 05/17/2011    Procedure: ABDOMINAL AORTAGRAM;  Surgeon: Nada Libman, MD;  Location: [facility] CATH LAB;  Service: Cardiovascular;  Laterality: N/A;  . Lower extremity angiogram  05/17/2011    Procedure: LOWER EXTREMITY ANGIOGRAM;  Surgeon: Nada Libman, MD;  Location: [facility] CATH LAB;  Service: Cardiovascular;;  . Percutaneous stent intervention Right 05/17/2011    Procedure: PERCUTANEOUS STENT INTERVENTION;  Surgeon: Nada Libman, MD;  Location: [facility] CATH LAB;  Service: Cardiovascular;  Laterality: Right;  . Lower extremity  angiogram N/A 09/21/2011    Procedure: LOWER EXTREMITY ANGIOGRAM;  Surgeon: Nada Libman, MD;  Location: [facility] CATH LAB;  Service: Cardiovascular;  Laterality: N/A;  . Abdominal angiogram  09/21/2011    Procedure: ABDOMINAL ANGIOGRAM;  Surgeon: Nada Libman, MD;  Location: [facility] CATH LAB;  Service: Cardiovascular;;  . Abdominal aortagram N/A 01/17/2012    Procedure: ABDOMINAL Ronny Flurry;  Surgeon: Nada Libman, MD;  Location: [facility] CATH LAB;  Service: Cardiovascular;  Laterality: N/A;  . Left heart catheterization with coronary angiogram N/A 04/05/2012    Procedure: LEFT HEART CATHETERIZATION WITH CORONARY ANGIOGRAM;  Surgeon: Tonny Bollman, MD;  Location: [facility] CATH LAB;  Service: Cardiovascular;  Laterality: N/A;    Royann Shivers MS,RD,CSG,LDN Office: [phone] Pager: [phone]

## 2014-08-23 NOTE — Progress Notes (Signed)
Subjective: Patient reports leakage around her catheter with some increased suprapubic pressure. Currently urine in her drainage bag is a light tea color. Irrigation appears to be flowing well and the fluid leaving the catheter is currently relatively clear. We did however perform bladder irrigation and found a considerable number of old clots. After irrigation her urine was clear to very light pink. It appears that her active bleeding has resolved but she has residual clots within her bladder.  Objective: Vital signs in last 24 hours: Temp:  [98.1 F (36.7 C)-98.3 F (36.8 C)] 98.2 F (36.8 C) (02/20 1013) Pulse Rate:  [87-99] 94 (02/20 1013) Resp:  [16-18] 18 (02/20 1013) BP: (109-154)/(44-60) 131/49 mmHg (02/20 1013) SpO2:  [92 %-97 %] 93 % (02/20 1013)  Intake/Output from previous day: 02/19 0701 - 02/20 0700 In: 1101.3 [I.V.:901.3; IV Piggyback:200] Out: 4900 [Urine:4900] Intake/Output this shift:    Physical Exam:  Constitutional: Vital signs reviewed. WD WN in NAD   Eyes: PERRL, No scleral icterus.   Cardiovascular: RRR Pulmonary/Chest: Normal effort Abdominal: Soft. Non-tender, non-distended, bowel sounds are normal, no masses, organomegaly, or guarding present.  Genitourinary: Three-way Foley catheter was some leakage around the urethra. A few blood clots noted. Extremities: No cyanosis or edema   Lab Results:  Recent Labs  08/21/14 1230 08/22/14 0450 08/23/14 0530  HGB 10.8* 9.6* 8.6*  HCT 31.9* 28.7* 25.9*   BMET  Recent Labs  08/21/14 0908  NA 135  K 3.7  CL 102  CO2 24  GLUCOSE 261*  BUN 22  CREATININE 1.40*  CALCIUM 8.5   No results for input(s): LABPT, INR in the last 72 hours. No results for input(s): LABURIN in the last 72 hours. Results for orders placed or performed during the hospital encounter of 08/18/14  Urine culture     Status: None   Collection Time: 08/19/14 12:21 AM  Result Value Ref Range Status   Specimen Description URINE,  CATHETERIZED  Final   Special Requests NONE  Final   Colony Count NO GROWTH Performed at [facility]   Final   Culture NO GROWTH Performed at [facility]   Final   Report Status 08/20/2014 FINAL  Final    Studies/Results: Ct Abdomen Pelvis W Wo Contrast  08/21/2014   CLINICAL DATA:  Gross hematuria, left UPJ stenosis  EXAM: CT ABDOMEN AND PELVIS WITHOUT AND WITH CONTRAST  TECHNIQUE: Multidetector CT imaging of the abdomen and pelvis was performed following the standard protocol before and following the bolus administration of intravenous contrast.  CONTRAST:  75 mL OMNIPAQUE IOHEXOL 300 MG/ML  SOLN  COMPARISON:  CTA abdomen/pelvis dated 08/22/2011  FINDINGS: Lower chest:  Lung bases are clear.  Hepatobiliary: Mildly nodular contour of the left hepatic lobe, suggesting cirrhosis. 9 mm cyst in the lateral segment left hepatic lobe, unchanged.  Layering gallstones (series 2/ image 29). No intrahepatic or extrahepatic ductal dilatation.  Pancreas: Within normal limits.  Spleen: Within normal limits.  Adrenals/Urinary Tract: Adrenal glands are unremarkable.  Linear calcifications along the right kidney are favored to reflect renal vascular calcifications. Mild right hydronephrosis with extrarenal pelvis. Nonenhancing hyperdense soft tissue in the right renal pelvis reflects clot (series 2/ image 31).  Left kidney is notable for left renal vascular calcifications with suspected superimposed nonobstructing renal calculi measuring up to 5 mm (series 2/ image 27). Chronic severe hydronephrosis with UPJ stenosis, progressed since 2013. Layering high density within the collecting system likely reflects debris/hemorrhage, without evidence of enhancement.  No ureteral or bladder calculi. On delayed imaging, the bilateral ureters and bladder do not opacify.  Thick-walled bladder with gas an indwelling Foley catheter. Additional nonenhancing clot within the bladder. No definite soft tissue mass.   Stomach/Bowel: Stomach is unremarkable.  No evidence of bowel obstruction.  Normal appendix.  Extensive colonic diverticulosis, without evidence of diverticulitis.  Moderate colonic stool burden, suggesting constipation.  Vascular/Lymphatic: Extensive atherosclerotic calcifications of the abdominal aorta and branch vessels.  Aorto bi-iliac stent.  Reproductive: Status post hysterectomy.  No adnexal masses.  Other: No abdominopelvic ascites.  Musculoskeletal: Mild degenerative changes of the visualized thoracolumbar spine.  IMPRESSION: Nonobstructing left renal calculi measuring up to 5 mm. No ureteral or bladder calculi.  Hemorrhage/clot within the right renal collecting system and bladder. Additional hemorrhage/debris within the left renal collecting system. No enhancing soft tissue/mass is visualized.  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 Mild right hydronephrosis with extrarenal pelvis.  Mildly thick-walled bladder with indwelling Foley catheter.   Electronically Signed   By: Charline Bills M.D.   On: 08/21/2014 13:41    Assessment/Plan:   Please review Dr. Loraine Leriche Ottelin's note from 219 for full assessment and plan. The active bleeding appears to have resolved. She does have residual clots in her bladder and we were able to irrigate many clots this morning. Urine substantially clear at this point. The nursing service has been instructed to irrigate the Foley when necessary. We will be happy to return to see her if the catheter stops draining. I would continue some bladder irrigation at a very low rate.   LOS: 4 days   Rishi Vicario S 08/23/2014, 10:56 AM

## 2014-08-23 NOTE — Progress Notes (Signed)
Subjective: Not eating, because of this had hypo yesterday.  We discussed her diet and her desire is to have some outside food, which I am ok with to facilitate some nutrition.  Discussed with her son and indicated that she can have things she otherwise would not be given but needs to get better protein in.  More clots noted from bladder in past 24 hours  Objective: Vital signs in last 24 hours: Temp:  [98.1 F (36.7 C)-98.3 F (36.8 C)] 98.1 F (36.7 C) (02/20 0540) Pulse Rate:  [87-99] 99 (02/20 0540) Resp:  [16-18] 18 (02/20 0540) BP: (109-154)/(44-60) 154/47 mmHg (02/20 0540) SpO2:  [92 %-97 %] 94 % (02/20 0540) Weight change:  Last BM Date: 08/18/14  Intake/Output from previous day: 02/19 0701 - 02/20 0700 In: 1101.3 [I.V.:901.3; IV Piggyback:200] Out: 4900 [Urine:4900] Intake/Output this shift:    General appearance: alert, cooperative and appears stated age Neck: no adenopathy, no carotid bruit, no JVD, supple, symmetrical, trachea midline and thyroid not enlarged, symmetric, no tenderness/mass/nodules Resp: clear to auscultation bilaterally Cardio: normal apical impulse GI: soft, non-tender; bowel sounds normal; no masses,  no organomegaly Extremities: R leg s/p amputation Lab Results:  Recent Labs  08/22/14 0450 08/23/14 0530  WBC 19.6* 10.6*  HGB 9.6* 8.6*  HCT 28.7* 25.9*  PLT 261 225   BMET  Recent Labs  08/20/14 1052 08/21/14 0908  NA 134* 135  K 4.7 3.7  CL 98 102  CO2 24 24  GLUCOSE 319* 261*  BUN 27* 22  CREATININE 1.70* 1.40*  CALCIUM 8.8 8.5    Studies/Results: Ct Abdomen Pelvis W Wo Contrast  08/21/2014   CLINICAL DATA:  Gross hematuria, left UPJ stenosis  EXAM: CT ABDOMEN AND PELVIS WITHOUT AND WITH CONTRAST  TECHNIQUE: Multidetector CT imaging of the abdomen and pelvis was performed following the standard protocol before and following the bolus administration of intravenous contrast.  CONTRAST:  75 mL OMNIPAQUE IOHEXOL 300 MG/ML  SOLN   COMPARISON:  CTA abdomen/pelvis dated 08/22/2011  FINDINGS: Lower chest:  Lung bases are clear.  Hepatobiliary: Mildly nodular contour of the left hepatic lobe, suggesting cirrhosis. 9 mm cyst in the lateral segment left hepatic lobe, unchanged.  Layering gallstones (series 2/ image 29). No intrahepatic or extrahepatic ductal dilatation.  Pancreas: Within normal limits.  Spleen: Within normal limits.  Adrenals/Urinary Tract: Adrenal glands are unremarkable.  Linear calcifications along the right kidney are favored to reflect renal vascular calcifications. Mild right hydronephrosis with extrarenal pelvis. Nonenhancing hyperdense soft tissue in the right renal pelvis reflects clot (series 2/ image 31).  Left kidney is notable for left renal vascular calcifications with suspected superimposed nonobstructing renal calculi measuring up to 5 mm (series 2/ image 27). Chronic severe hydronephrosis with UPJ stenosis, progressed since 2013. Layering high density within the collecting system likely reflects debris/hemorrhage, without evidence of enhancement.  No ureteral or bladder calculi. On delayed imaging, the bilateral ureters and bladder do not opacify.  Thick-walled bladder with gas an indwelling Foley catheter. Additional nonenhancing clot within the bladder. No definite soft tissue mass.  Stomach/Bowel: Stomach is unremarkable.  No evidence of bowel obstruction.  Normal appendix.  Extensive colonic diverticulosis, without evidence of diverticulitis.  Moderate colonic stool burden, suggesting constipation.  Vascular/Lymphatic: Extensive atherosclerotic calcifications of the abdominal aorta and branch vessels.  Aorto bi-iliac stent.  Reproductive: Status post hysterectomy.  No adnexal masses.  Other: No abdominopelvic ascites.  Musculoskeletal: Mild degenerative changes of the visualized thoracolumbar spine.  IMPRESSION: Nonobstructing  left renal calculi measuring up to 5 mm. No ureteral or bladder calculi.   Hemorrhage/clot within the right renal collecting system and bladder. Additional hemorrhage/debris within the left renal collecting system. No enhancing soft tissue/mass is visualized.  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 Mild right hydronephrosis with extrarenal pelvis.  Mildly thick-walled bladder with indwelling Foley catheter.   Electronically Signed   By: Charline Bills M.D.   On: 08/21/2014 13:41    Medications:  I have reviewed the patient's current medications. Scheduled: . ciprofloxacin  400 mg Intravenous Q12H  . collagenase   Topical Daily  . ezetimibe  10 mg Oral Daily  . fluconazole  150 mg Oral Daily  . furosemide  40 mg Oral Daily  . insulin aspart  0-9 Units Subcutaneous TID WC  . insulin glargine  22 Units Subcutaneous Daily  . iron polysaccharides  450 mg Oral BID  . isosorbide mononitrate  30 mg Oral BID  . lactobacillus acidophilus  1 tablet Oral Daily  . linagliptin  5 mg Oral Daily  . metoprolol succinate  50 mg Oral BID  . mirabegron ER  50 mg Oral Daily  . omega-3 acid ethyl esters  2 g Oral BID  . pantoprazole  40 mg Oral Daily  . potassium chloride  10 mEq Oral BID  . ramipril  1.25 mg Oral Daily  . rosuvastatin  10 mg Oral Q7 days  . Vitamin D (Ergocalciferol)  50,000 Units Oral Q7 days   Continuous: . sodium chloride 75 mL/hr at 08/23/14 0250  . sodium chloride irrigation     WUJ:[id number], meperidine, nitroGLYCERIN, opium-belladonna, prochlorperazine, promethazine, sodium chloride  Assessment/Plan: PROFOUND ANEMIA: was 10+ now 8.6, down another point.  Will check again at 4pm and if <8, will transfuse another 2 units.  Urinary source. UTI (lower urinary tract infection) with :ACUTE GROSS HEMATURIA:Urology following and making recommendations on irrigation, etc.  She is off Plavix.  Diabetes mellitus type 2 with neurological manifestations: a bit low at 91, backed off a little but still low due to poor intake,.   Given her general  lack of intake will allow reasonable outside food and adjust accordingly.  Family in room will facilitate this.  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 Change dressings HYPERLIPIDEMIA: on Rx PROLIFERATIVE RETINOPATHY, chronic  LOS: 4 days   Pascha Fogal W 08/23/2014, 9:33 AM

## 2014-08-23 NOTE — Progress Notes (Signed)
Pt foley drained with no leakage or large clots noted during shift after MD irrigated catheter. Continuous irrigation remains as MD set, foley intact, pt remained clean and dry during shift. Pt urine remains clear with occasion small clot passing through with no problem. Pt remains in bed with husband at bedrest and call light within reach. Reported off to another [employer] fundraiser. Arabella Merles Libi Corso RN.

## 2014-08-24 LAB — CBC
HCT: 24.9 % — ABNORMAL LOW (ref 36.0–46.0)
Hemoglobin: 8.4 g/dL — ABNORMAL LOW (ref 12.0–15.0)
MCH: 26.5 pg (ref 26.0–34.0)
MCHC: 33.7 g/dL (ref 30.0–36.0)
MCV: 78.5 fL (ref 78.0–100.0)
Platelets: 215 10*3/uL (ref 150–400)
RBC: 3.17 MIL/uL — AB (ref 3.87–5.11)
RDW: 17.3 % — ABNORMAL HIGH (ref 11.5–15.5)
WBC: 9.6 10*3/uL (ref 4.0–10.5)

## 2014-08-24 LAB — GLUCOSE, CAPILLARY
GLUCOSE-CAPILLARY: 222 mg/dL — AB (ref 70–99)
GLUCOSE-CAPILLARY: 217 mg/dL — AB (ref 70–99)
Glucose-Capillary: 193 mg/dL — ABNORMAL HIGH (ref 70–99)

## 2014-08-24 LAB — BASIC METABOLIC PANEL
ANION GAP: 7 (ref 5–15)
BUN: 13 mg/dL (ref 6–23)
CALCIUM: 7.7 mg/dL — AB (ref 8.4–10.5)
CHLORIDE: 103 mmol/L (ref 96–112)
CO2: 23 mmol/L (ref 19–32)
Creatinine, Ser: 0.96 mg/dL (ref 0.50–1.10)
GFR calc Af Amer: 68 mL/min — ABNORMAL LOW (ref >90)
GFR calc non Af Amer: 59 mL/min — ABNORMAL LOW (ref >90)
Glucose, Bld: 203 mg/dL — ABNORMAL HIGH (ref 70–99)
POTASSIUM: 3.3 mmol/L — AB (ref 3.5–5.1)
SODIUM: 133 mmol/L — AB (ref 135–145)

## 2014-08-24 MED ORDER — CALCIUM CARBONATE ANTACID 500 MG PO CHEW
1.0000 | CHEWABLE_TABLET | Freq: Once | ORAL | Status: AC
  Administered 2014-08-24: 200 mg via ORAL
  Filled 2014-08-24: qty 1

## 2014-08-24 MED ORDER — POLYETHYLENE GLYCOL 3350 17 G PO PACK
17.0000 g | PACK | Freq: Every day | ORAL | Status: DC | PRN
  Filled 2014-08-24: qty 1

## 2014-08-24 MED ORDER — BISACODYL 10 MG RE SUPP
10.0000 mg | Freq: Once | RECTAL | Status: AC
  Administered 2014-08-24: 10 mg via RECTAL
  Filled 2014-08-24: qty 1

## 2014-08-24 MED ORDER — INSULIN GLARGINE 100 UNIT/ML ~~LOC~~ SOLN
26.0000 [IU] | Freq: Every day | SUBCUTANEOUS | Status: DC
  Administered 2014-08-24: 26 [IU] via SUBCUTANEOUS
  Filled 2014-08-24: qty 0.26

## 2014-08-24 NOTE — Progress Notes (Signed)
  Subjective: Patient reports feeling much better today. She has had no further issues with leakage around Foley catheter. Her catheters continued to drain well and urine has been clear. She has not had a bowel movement for several days. She has no new complaints or concerns today. Urine culture from several days ago was negative. Hemoglobin stable  Objective: Vital signs in last 24 hours: Temp:  [97.5 F (36.4 C)-99.4 F (37.4 C)] 97.5 F (36.4 C) (02/21 0459) Pulse Rate:  [87-94] 87 (02/21 0459) Resp:  [16-18] 16 (02/21 0459) BP: (128-174)/(48-56) 159/56 mmHg (02/21 0501) SpO2:  [93 %-95 %] 95 % (02/21 0459)  Intake/Output from previous day: 02/20 0701 - 02/21 0700 In: 3965.8 [P.O.:240; I.V.:1125.8; IV Piggyback:600] Out: 8495 [Urine:8495] Intake/Output this shift:    Physical Exam:  Constitutional: Vital signs reviewed. WD WN in NAD   Eyes: PERRL, No scleral icterus.   Cardiovascular: RRR Pulmonary/Chest: Normal effort Abdominal: Soft. Non-tender, non-distended, bowel sounds are normal, no masses, organomegaly, or guarding present.  Genitourinary: Three-way Foley catheter draining clear  Extremities: No cyanosis or edema   Lab Results:  Recent Labs  08/23/14 0530 08/23/14 1855 08/24/14 0417  HGB 8.6* 8.6* 8.4*  HCT 25.9* 26.0* 24.9*   BMET  Recent Labs  08/24/14 0417  NA 133*  K 3.3*  CL 103  CO2 23  GLUCOSE 203*  BUN 13  CREATININE 0.96  CALCIUM 7.7*   No results for input(s): LABPT, INR in the last 72 hours. No results for input(s): LABURIN in the last 72 hours. Results for orders placed or performed during the hospital encounter of 08/18/14  Urine culture     Status: None   Collection Time: 08/19/14 12:21 AM  Result Value Ref Range Status   Specimen Description URINE, CATHETERIZED  Final   Special Requests NONE  Final   Colony Count NO GROWTH Performed at [facility]   Final   Culture NO GROWTH Performed at [facility]  Final   Report Status 08/20/2014 FINAL  Final    Studies/Results: No results found.  Assessment/Plan:  Gross hematuria now resolved. No evidence of any significant clots. Her bladder was irrigated copiously yesterday. Foley catheter will be removed. Due to lack suppository for constipation. From a urologic perspective it is okay to discharge at any time. She will require follow-up as an outpatient within 1-2 weeks assuming she does well clinically.   LOS: 5 days   Ngoc Detjen S 08/24/2014, 9:59 AM

## 2014-08-24 NOTE — Progress Notes (Signed)
Pt discharge education and instructions completed with pt and spouse at bedside. All voices understanding and denies any questions. Pt IV removed by IV team per protocol; clean dsg intact to right neck from IV site. Pt cleaned and dressed up in her personal clothing provided by spouse from home. Pt discharge home with spouse to transport her home. Pt handed her home medication picked up from pharmacy. Pt transported off unit unit via wheelchair with spouse and belongings to the side. Arabella Merles Simcha Farrington RN.

## 2014-08-24 NOTE — Discharge Summary (Signed)
DISCHARGE SUMMARY  [place]  MR#: [id number]  DOB:03-22-1946  Date of Admission: 08/18/2014 Date of Discharge: 08/24/2014  Attending Physician:Earlyne Feeser W  Patient's FWY:OVZCH,YIFOYDX [name], MD  Consults:Treatment Team:  Claybon Jabs, MD  Discharge Diagnoses: Principal Problem:   UTI (lower urinary tract infection) Active Problems:   Diabetes mellitus type 2 with neurological manifestations   Hyperlipemia   CORONARY ATHEROSCLEROSIS NATIVE CORONARY ARTERY   Atherosclerosis of native arteries of extremity with intermittent claudication   Encounter for central line placement   Hematuria   Anemia requiring transfusion Poor PO intake  Constipation Insomnia Anxiety S/p Lower extremity amputation, right Hand ulcers, vascular, chronic Iron deficiency anemia Acute renal insufficiency, resolved.  Discharge Medications:   Medication List    STOP taking these medications        aspirin 325 MG EC tablet     fluconazole 150 MG tablet  Commonly known as:  DIFLUCAN     PLAVIX 75 MG tablet  Generic drug:  clopidogrel     promethazine 25 MG tablet  Commonly known as:  PHENERGAN     sulfamethoxazole-trimethoprim 800-160 MG per tablet  Commonly known as:  BACTRIM DS,SEPTRA DS      TAKE these medications        ACIDOPHILUS PEARLS Caps  Take 1 capsule by mouth daily.     ALPRAZolam 0.25 MG tablet  Commonly known as:  XANAX  Take 1 tablet (0.25 mg total) by mouth 3 (three) times daily as needed for anxiety.     DEXILANT 60 MG capsule  Generic drug:  dexlansoprazole  Take 60 mg by mouth 2 (two) times daily.     ezetimibe 10 MG tablet  Commonly known as:  ZETIA  Take 1 tablet (10 mg total) by mouth daily.     furosemide 40 MG tablet  Commonly known as:  LASIX  Take 1 tablet (40 mg total) by mouth daily.     iron polysaccharides 150 MG capsule  Commonly known as:  NIFEREX  Take 450 mg by mouth 2 (two) times daily.     isosorbide mononitrate 60 MG 24  hr tablet  Commonly known as:  IMDUR  Take 1 tablet (60 mg total) by mouth daily.     meperidine 50 MG tablet  Commonly known as:  DEMEROL  Take 1 tablet (50 mg total) by mouth every 6 (six) hours as needed. For pain     metoprolol succinate 50 MG 24 hr tablet  Commonly known as:  TOPROL-XL  Take 50 mg by mouth 2 (two) times daily. Take with or immediately following a meal.     nitroGLYCERIN 0.4 MG SL tablet  Commonly known as:  NITROSTAT  Place 1 tablet (0.4 mg total) under the tongue every 5 (five) minutes as needed. For chest pain     omega-3 acid ethyl esters 1 G capsule  Commonly known as:  LOVAZA  Take 2 g by mouth 2 (two) times daily.     pioglitazone-metformin 15-850 MG per tablet  Commonly known as:  ACTOPLUS MET  - Take 1 tablet by mouth daily. Brand name only-does not work if components are given separately per patient  - 15-850 MG     potassium chloride 10 MEQ tablet  Commonly known as:  K-DUR  Take 1 tablet (10 mEq total) by mouth 2 (two) times daily.     prochlorperazine 10 MG tablet  Commonly known as:  COMPAZINE  Take 1 tablet (10 mg total) by mouth every  6 (six) hours as needed. For nausea     ramipril 1.25 MG capsule  Commonly known as:  ALTACE  Take 1 capsule (1.25 mg total) by mouth 2 (two) times daily.     rosuvastatin 10 MG tablet  Commonly known as:  CRESTOR  Take 1 tablet (10 mg total) by mouth every 7 (seven) days. Takes on Monday's     sitaGLIPtin 100 MG tablet  Commonly known as:  JANUVIA  Take 100 mg by mouth at bedtime.     Vitamin D (Ergocalciferol) 50000 UNITS Caps capsule  Commonly known as:  DRISDOL  Take 1 capsule (50,000 Units total) by mouth every 7 (seven) days.        Hospital Procedures: Ct Abdomen Pelvis W Wo Contrast  08/21/2014   CLINICAL DATA:  Gross hematuria, left UPJ stenosis  EXAM: CT ABDOMEN AND PELVIS WITHOUT AND WITH CONTRAST  TECHNIQUE: Multidetector CT imaging of the abdomen and pelvis was performed following  the standard protocol before and following the bolus administration of intravenous contrast.  CONTRAST:  75 mL OMNIPAQUE IOHEXOL 300 MG/ML  SOLN  COMPARISON:  CTA abdomen/pelvis dated 08/22/2011  FINDINGS: Lower chest:  Lung bases are clear.  Hepatobiliary: Mildly nodular contour of the left hepatic lobe, suggesting cirrhosis. 9 mm cyst in the lateral segment left hepatic lobe, unchanged.  Layering gallstones (series 2/ image 29). No intrahepatic or extrahepatic ductal dilatation.  Pancreas: Within normal limits.  Spleen: Within normal limits.  Adrenals/Urinary Tract: Adrenal glands are unremarkable.  Linear calcifications along the right kidney are favored to reflect renal vascular calcifications. Mild right hydronephrosis with extrarenal pelvis. Nonenhancing hyperdense soft tissue in the right renal pelvis reflects clot (series 2/ image 31).  Left kidney is notable for left renal vascular calcifications with suspected superimposed nonobstructing renal calculi measuring up to 5 mm (series 2/ image 27). Chronic severe hydronephrosis with UPJ stenosis, progressed since 2013. Layering high density within the collecting system likely reflects debris/hemorrhage, without evidence of enhancement.  No ureteral or bladder calculi. On delayed imaging, the bilateral ureters and bladder do not opacify.  Thick-walled bladder with gas an indwelling Foley catheter. Additional nonenhancing clot within the bladder. No definite soft tissue mass.  Stomach/Bowel: Stomach is unremarkable.  No evidence of bowel obstruction.  Normal appendix.  Extensive colonic diverticulosis, without evidence of diverticulitis.  Moderate colonic stool burden, suggesting constipation.  Vascular/Lymphatic: Extensive atherosclerotic calcifications of the abdominal aorta and branch vessels.  Aorto bi-iliac stent.  Reproductive: Status post hysterectomy.  No adnexal masses.  Other: No abdominopelvic ascites.  Musculoskeletal: Mild degenerative changes of the  visualized thoracolumbar spine.  IMPRESSION: Nonobstructing left renal calculi measuring up to 5 mm. No ureteral or bladder calculi.  Hemorrhage/clot within the right renal collecting system and bladder. Additional hemorrhage/debris within the left renal collecting system. No enhancing soft tissue/mass is visualized.  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 Mild right hydronephrosis with extrarenal pelvis.  Mildly thick-walled bladder with indwelling Foley catheter.   Electronically Signed   By: Julian Hy M.D.   On: 08/21/2014 13:41   Dg Chest Port 1 View  08/20/2014   CLINICAL DATA:  Nausea and vomiting, status post central line placement  EXAM: PORTABLE CHEST - 1 VIEW  COMPARISON:  None.  FINDINGS: Cardiac shadow is within normal limits. The lungs are clear. No focal effusion or pneumothorax is seen. A right jugular central line is noted with the tip in the mid superior vena cava.  IMPRESSION: No evidence of  pneumothorax following central line placement. Catheter tip is in the mid superior vena cava.   Electronically Signed   By: Inez Catalina M.D.   On: 08/20/2014 10:13    History of Present Illness: Patient is a 69 year old female who was admitted with anemia requiring transfusion from urologic bleeding  Hospital Course: [name] is a 69 year old female with 8 months of urologic issues including left hydronephrosis and left UPJ obstruction. A stent was placed in the late summer. She does have known nephrolithiasis. She has had several rounds of antibiotics. Acutely yesterday she had gross hematuria with clots and came to the ER. She was admitted due to this issue. She has been wearing a pad due to incontinence. It was acutely bloody with clots   She was on Plavix and ASA.  Tranfused and seen by Urology in consultation with bladder irrigation initiated.  Continued to have considerable clots.  Hgb rose to 10+ after transfusion, and then dropped to the mid 8's where it remains  stable x 48 hours prior to discharge.  She also had a mild elevation in her creatinine which improved prior to discharge as wellShe continued to irrigate and finally the evening of 2/20 had no more clots or leakage.  Catheter was removed by Urology 2/21 AM and told she could go home.  She however had not had a BM or had even been mobilized in her wheelchair.  She was able to both before being discharged to home with her family.  Day of Discharge Exam BP 138/44 mmHg  Pulse 86  Temp(Src) 97.5 F (36.4 C) (Oral)  Resp 18  Ht 5' 4.8" (1.646 m)  Wt 58.922 kg (129 lb 14.4 oz)  BMI 21.75 kg/m2  SpO2 95%  Physical Exam: General appearance: alert, cooperative and appears stated age Neck: no adenopathy, no carotid bruit, no JVD, supple, symmetrical, trachea midline and thyroid not enlarged, symmetric, no tenderness/mass/nodules Resp: clear to auscultation bilaterally Cardio: normal apical impulse GI: soft, non-tender; bowel sounds normal; no masses, no organomegaly GU: Catheter removed Extremities: R leg s/p amputation  Discharge Labs:  Recent Labs  08/24/14 0417  NA 133*  K 3.3*  CL 103  CO2 23  GLUCOSE 203*  BUN 13  CREATININE 0.96  CALCIUM 7.7*    Recent Labs  08/23/14 0530 08/23/14 1855 08/24/14 0417  WBC 10.6*  --  9.6  HGB 8.6* 8.6* 8.4*  HCT 25.9* 26.0* 24.9*  MCV 78.7  --  78.5  PLT 225  --  215   Lab Results  Component Value Date   INR 0.94 05/14/2014   INR 1.18 03/26/2012   INR 0.93 03/21/2012    Discharge instructions:  Call Urology if more blood or clots noted in Urine   Disposition: Home  Follow-up Appts: Follow-up with Dr. Forde Dandy at [facility] in 1 week.  Call for appointment.  Condition on Discharge: Stable  Tests Needing Follow-up: CBC, BMET in 1 week with f/u with Dr. Forde Dandy   Total time of Discharge and management   60 minutes Signed: Jodi Kappes W 08/24/2014, 1:40 PM

## 2014-08-24 NOTE — Progress Notes (Signed)
Subjective: Morale much better this AM./  Family brought her a milkshake and she has a better appetite this AM as well.  No issues with irrigation and her H+H are staying stable.  Really wants to venture off the unit later today for just a short while.  Objective: Vital signs in last 24 hours: Temp:  [97.5 F (36.4 C)-99.4 F (37.4 C)] 97.5 F (36.4 C) (02/21 0459) Pulse Rate:  [87-94] 87 (02/21 0459) Resp:  [16-18] 16 (02/21 0459) BP: (128-174)/(48-56) 159/56 mmHg (02/21 0501) SpO2:  [93 %-95 %] 95 % (02/21 0459) Weight change:  Last BM Date: 08/23/14  Intake/Output from previous day: 02/20 0701 - 02/21 0700 In: 3965.8 [P.O.:240; I.V.:1125.8; IV Piggyback:600] Out: 8495 [Urine:8495] Intake/Output this shift:   General appearance: alert, cooperative and appears stated age Neck: no adenopathy, no carotid bruit, no JVD, supple, symmetrical, trachea midline and thyroid not enlarged, symmetric, no tenderness/mass/nodules Resp: clear to auscultation bilaterally Cardio: normal apical impulse GI: soft, non-tender; bowel sounds normal; no masses, no organomegaly GU:  Irrigation in place Extremities: R leg s/p amputation  Lab Results:  Recent Labs  08/23/14 0530 08/23/14 1855 08/24/14 0417  WBC 10.6*  --  9.6  HGB 8.6* 8.6* 8.4*  HCT 25.9* 26.0* 24.9*  PLT 225  --  215   BMET  Recent Labs  08/24/14 0417  NA 133*  K 3.3*  CL 103  CO2 23  GLUCOSE 203*  BUN 13  CREATININE 0.96  CALCIUM 7.7*    Studies/Results: No results found.  Medications:  I have reviewed the patient's current medications. Scheduled: . bisacodyl  10 mg Rectal Once  . ciprofloxacin  400 mg Intravenous Q12H  . collagenase   Topical Daily  . ezetimibe  10 mg Oral Daily  . feeding supplement (GLUCERNA SHAKE)  237 mL Oral BID BM  . feeding supplement (PRO-STAT SUGAR FREE 64)  30 mL Oral BID BM  . fluconazole  150 mg Oral Daily  . furosemide  40 mg Oral Daily  . insulin aspart  0-9 Units  Subcutaneous TID WC  . insulin glargine  26 Units Subcutaneous Daily  . iron polysaccharides  450 mg Oral BID  . isosorbide mononitrate  30 mg Oral BID  . lactobacillus acidophilus  1 tablet Oral Daily  . linagliptin  5 mg Oral Daily  . metoprolol succinate  50 mg Oral BID  . mirabegron ER  50 mg Oral Daily  . omega-3 acid ethyl esters  2 g Oral BID  . pantoprazole  40 mg Oral Daily  . potassium chloride  10 mEq Oral BID  . ramipril  1.25 mg Oral Daily  . rosuvastatin  10 mg Oral Q7 days  . Vitamin D (Ergocalciferol)  50,000 Units Oral Q7 days   Continuous: . sodium chloride irrigation     POL:IDCVUDTHYH, meperidine, nitroGLYCERIN, opium-belladonna, prochlorperazine, promethazine, sodium chloride  Assessment/Plan: PROFOUND ANEMIA:Holding in the mid 8's hgb at this point. UTI (lower urinary tract infection) with : ACUTE GROSS HEMATURIA:Urology may d/c irrigation today. She is off Plavix.  Diabetes mellitus type 2 with neurological manifestations: a bit low at 91, backed off a little but still low due to poor intake,. Given her general lack of intake will allow reasonable outside food and adjust accordingly. Family in room will facilitate this.  Hyperlipemia: on Rx  CORONARY ATHEROSCLEROSIS NATIVE CORONARY ARTERY; doing OK, EKG non ischemic, troponin OK  Atherosclerosis of native arteries of extremity with intermittent claudication PVD WITH FINGER ULCERATIONS: still  an active issue, no pain at the present. Change dressings HYPERLIPIDEMIA: on Rx PROLIFERATIVE RETINOPATHY, chronic Will try to mobilize her a bit today and also has not had a BM since she has been here, so will be given meds to help that.  Likely home in the AM if maintains Hgb.   LOS: 5 days   [name] W 08/24/2014, 9:48 AM

## 2014-08-24 NOTE — Progress Notes (Signed)
Pt foley was removed this am at 1015, pt voided x1 unmeasured and x1 BM. No clot or hematuria noted since removal or foley. Pt denies any pain, discomfort or clots. Will continue to monitor pt quietly. Arabella Merles Tyland Klemens RN.

## 2014-09-02 ENCOUNTER — Encounter (HOSPITAL_BASED_OUTPATIENT_CLINIC_OR_DEPARTMENT_OTHER): Payer: BLUE CROSS/BLUE SHIELD

## 2014-09-09 ENCOUNTER — Encounter (HOSPITAL_BASED_OUTPATIENT_CLINIC_OR_DEPARTMENT_OTHER): Payer: Medicare Other | Attending: General Surgery

## 2014-09-09 DIAGNOSIS — L98491 Non-pressure chronic ulcer of skin of other sites limited to breakdown of skin: Secondary | ICD-10-CM | POA: Insufficient documentation

## 2014-09-09 DIAGNOSIS — L97521 Non-pressure chronic ulcer of other part of left foot limited to breakdown of skin: Secondary | ICD-10-CM | POA: Insufficient documentation

## 2014-09-09 DIAGNOSIS — E11621 Type 2 diabetes mellitus with foot ulcer: Secondary | ICD-10-CM | POA: Insufficient documentation

## 2014-09-16 DIAGNOSIS — L98491 Non-pressure chronic ulcer of skin of other sites limited to breakdown of skin: Secondary | ICD-10-CM | POA: Diagnosis not present

## 2014-09-16 DIAGNOSIS — L97521 Non-pressure chronic ulcer of other part of left foot limited to breakdown of skin: Secondary | ICD-10-CM | POA: Diagnosis not present

## 2014-09-16 DIAGNOSIS — E11621 Type 2 diabetes mellitus with foot ulcer: Secondary | ICD-10-CM | POA: Diagnosis not present

## 2014-09-16 LAB — GLUCOSE, CAPILLARY: Glucose-Capillary: 157 mg/dL — ABNORMAL HIGH (ref 70–99)

## 2014-10-07 ENCOUNTER — Ambulatory Visit: Payer: Medicare Other | Admitting: Vascular Surgery

## 2014-10-07 ENCOUNTER — Encounter (HOSPITAL_BASED_OUTPATIENT_CLINIC_OR_DEPARTMENT_OTHER): Payer: Medicare Other | Attending: General Surgery

## 2014-10-07 ENCOUNTER — Other Ambulatory Visit (HOSPITAL_COMMUNITY): Payer: Medicare Other

## 2014-10-07 DIAGNOSIS — L97521 Non-pressure chronic ulcer of other part of left foot limited to breakdown of skin: Secondary | ICD-10-CM | POA: Insufficient documentation

## 2014-10-07 DIAGNOSIS — E11621 Type 2 diabetes mellitus with foot ulcer: Secondary | ICD-10-CM | POA: Insufficient documentation

## 2014-10-07 DIAGNOSIS — L98491 Non-pressure chronic ulcer of skin of other sites limited to breakdown of skin: Secondary | ICD-10-CM | POA: Insufficient documentation

## 2014-10-07 DIAGNOSIS — E1169 Type 2 diabetes mellitus with other specified complication: Secondary | ICD-10-CM | POA: Insufficient documentation

## 2014-10-07 DIAGNOSIS — Z794 Long term (current) use of insulin: Secondary | ICD-10-CM | POA: Insufficient documentation

## 2014-10-07 DIAGNOSIS — I771 Stricture of artery: Secondary | ICD-10-CM | POA: Insufficient documentation

## 2014-10-28 DIAGNOSIS — L98491 Non-pressure chronic ulcer of skin of other sites limited to breakdown of skin: Secondary | ICD-10-CM | POA: Diagnosis not present

## 2014-10-28 DIAGNOSIS — E11621 Type 2 diabetes mellitus with foot ulcer: Secondary | ICD-10-CM | POA: Diagnosis not present

## 2014-10-28 DIAGNOSIS — E1169 Type 2 diabetes mellitus with other specified complication: Secondary | ICD-10-CM | POA: Diagnosis not present

## 2014-10-28 DIAGNOSIS — I771 Stricture of artery: Secondary | ICD-10-CM | POA: Diagnosis not present

## 2014-10-28 DIAGNOSIS — L97521 Non-pressure chronic ulcer of other part of left foot limited to breakdown of skin: Secondary | ICD-10-CM | POA: Diagnosis not present

## 2014-10-28 DIAGNOSIS — Z794 Long term (current) use of insulin: Secondary | ICD-10-CM | POA: Diagnosis not present

## 2014-11-11 ENCOUNTER — Encounter (HOSPITAL_BASED_OUTPATIENT_CLINIC_OR_DEPARTMENT_OTHER): Payer: Medicare Other | Attending: General Surgery

## 2014-11-11 DIAGNOSIS — E11621 Type 2 diabetes mellitus with foot ulcer: Secondary | ICD-10-CM | POA: Diagnosis not present

## 2014-11-11 DIAGNOSIS — L97521 Non-pressure chronic ulcer of other part of left foot limited to breakdown of skin: Secondary | ICD-10-CM | POA: Diagnosis not present

## 2014-11-11 DIAGNOSIS — L98491 Non-pressure chronic ulcer of skin of other sites limited to breakdown of skin: Secondary | ICD-10-CM | POA: Diagnosis not present

## 2014-11-21 ENCOUNTER — Other Ambulatory Visit (HOSPITAL_COMMUNITY): Payer: Self-pay | Admitting: Urology

## 2014-11-21 DIAGNOSIS — Q6239 Other obstructive defects of renal pelvis and ureter: Secondary | ICD-10-CM

## 2014-11-21 DIAGNOSIS — Q6211 Congenital occlusion of ureteropelvic junction: Principal | ICD-10-CM

## 2014-12-22 ENCOUNTER — Ambulatory Visit (HOSPITAL_COMMUNITY): Payer: BLUE CROSS/BLUE SHIELD

## 2014-12-26 ENCOUNTER — Encounter: Payer: Self-pay | Admitting: Vascular Surgery

## 2014-12-29 ENCOUNTER — Ambulatory Visit (HOSPITAL_COMMUNITY)
Admission: RE | Admit: 2014-12-29 | Discharge: 2014-12-29 | Disposition: A | Discharge status: Home / Self Care | Payer: Medicare Other | Source: Ambulatory Visit | Attending: Urology | Admitting: Urology

## 2014-12-29 ENCOUNTER — Ambulatory Visit (INDEPENDENT_AMBULATORY_CARE_PROVIDER_SITE_OTHER)
Admission: RE | Admit: 2014-12-29 | Discharge: 2014-12-29 | Disposition: A | Discharge status: Home / Self Care | Payer: Medicare Other | Source: Ambulatory Visit | Attending: Vascular Surgery | Admitting: Vascular Surgery

## 2014-12-29 ENCOUNTER — Ambulatory Visit (HOSPITAL_COMMUNITY)
Admission: RE | Admit: 2014-12-29 | Discharge: 2014-12-29 | Disposition: A | Discharge status: Home / Self Care | Payer: Medicare Other | Source: Ambulatory Visit | Attending: Vascular Surgery | Admitting: Vascular Surgery

## 2014-12-29 DIAGNOSIS — I6523 Occlusion and stenosis of bilateral carotid arteries: Secondary | ICD-10-CM

## 2014-12-29 DIAGNOSIS — I739 Peripheral vascular disease, unspecified: Secondary | ICD-10-CM

## 2014-12-29 DIAGNOSIS — Z48812 Encounter for surgical aftercare following surgery on the circulatory system: Secondary | ICD-10-CM

## 2014-12-29 DIAGNOSIS — Q6211 Congenital occlusion of ureteropelvic junction: Secondary | ICD-10-CM | POA: Diagnosis not present

## 2014-12-29 DIAGNOSIS — Q6239 Other obstructive defects of renal pelvis and ureter: Secondary | ICD-10-CM

## 2014-12-29 MED ORDER — FUROSEMIDE 10 MG/ML IJ SOLN
29.0000 mg | Freq: Once | INTRAMUSCULAR | Status: DC
  Filled 2014-12-29: qty 4

## 2014-12-30 ENCOUNTER — Encounter: Payer: Self-pay | Admitting: Vascular Surgery

## 2014-12-30 ENCOUNTER — Encounter (INDEPENDENT_AMBULATORY_CARE_PROVIDER_SITE_OTHER): Payer: Self-pay

## 2014-12-30 ENCOUNTER — Encounter (HOSPITAL_BASED_OUTPATIENT_CLINIC_OR_DEPARTMENT_OTHER): Payer: Medicare Other | Attending: General Surgery

## 2014-12-30 ENCOUNTER — Ambulatory Visit (INDEPENDENT_AMBULATORY_CARE_PROVIDER_SITE_OTHER): Payer: Medicare Other | Admitting: Vascular Surgery

## 2014-12-30 VITALS — BP 115/51 | HR 80 | Ht 64.8 in | Wt 142.0 lb

## 2014-12-30 DIAGNOSIS — I739 Peripheral vascular disease, unspecified: Secondary | ICD-10-CM | POA: Diagnosis not present

## 2014-12-30 DIAGNOSIS — I6523 Occlusion and stenosis of bilateral carotid arteries: Secondary | ICD-10-CM | POA: Diagnosis not present

## 2014-12-30 DIAGNOSIS — Z48812 Encounter for surgical aftercare following surgery on the circulatory system: Secondary | ICD-10-CM

## 2014-12-30 DIAGNOSIS — E11622 Type 2 diabetes mellitus with other skin ulcer: Secondary | ICD-10-CM | POA: Diagnosis present

## 2014-12-30 DIAGNOSIS — L98491 Non-pressure chronic ulcer of skin of other sites limited to breakdown of skin: Secondary | ICD-10-CM | POA: Diagnosis not present

## 2014-12-30 NOTE — Progress Notes (Signed)
Patient resents today for follow-up of her diffuse peripheral vascular occlusive disease. She looks good today. She is in good spirits. She is in a wheelchair. She does have her right BK prosthesis in place. She is here today with her husband. She denies any focal neurologic deficits. She does have a superficial ulceration over the tip of her left great toe with no evidence of invasive infection. She continues to have difficulty with her right hand. She is seeing a hand specialist in [facility] who is done Botox injections and is talking about some attempted the hand procedure to increase flow to her hand. This is painful to her.  Past Medical History  Diagnosis Date  . Hypertension     Unspecified  . Hyperlipidemia     Mixed  . Tobacco abuse     Remote  . Loose, teeth     has loose bridge and two loose teeth holding it  . PONV (postoperative nausea and vomiting)   . Type II diabetes mellitus   . GERD (gastroesophageal reflux disease)   . Anxiety   . Peripheral vascular disease     unspecified, a. s/p L CEA b. s/p B fem-pop bypass  . Raynaud disease   . Stones in the urinary tract   . UTI (urinary tract infection)   . Anemia     hx  . Coronary artery disease     a. s/p NSTEMI 02/2009 - PCI LCX with Xience DES. Otherwise branch vessel and Dist. RCA dzs. NL EF.   Marland Kitchen Myocardial infarction   . CHF (congestive heart failure)   . Kidney stones   . UTI (lower urinary tract infection)   . Constipation   . Hernia, umbilical   . History of blood transfusion   . Fall from slipping on wet surface Jan. 7, 2014    Right stump bleeding from fall  . Carotid artery occlusion     History  Substance Use Topics  . Smoking status: Former Smoker -- 2.00 packs/day for 39 years    Types: Cigarettes    Quit date: 07/05/2001  . Smokeless tobacco: Never Used  . Alcohol Use: 8.4 oz/week    14 Standard drinks or equivalent per week    Family History  Problem Relation Age of Onset   . Heart attack Mother   . Heart attack Father   . Hypertension Brother   . Diabetes Brother   . Hypertension Brother     Allergies  Allergen Reactions  . Acetaminophen Nausea Only and Other (See Comments)    Does not tolerate well, nausea  . Ceftriaxone Itching  . Codeine Nausea And Vomiting  . Erythromycin Nausea And Vomiting  . Hydrocodone-Acetaminophen Nausea And Vomiting  . Hydromorphone Nausea And Vomiting  . Ibuprofen Other (See Comments)    Does not tolerate well  . Propoxyphene Hcl Nausea And Vomiting  . Statins Other (See Comments)    Leg myalgias  . Eggs Or Egg-Derived Products Other (See Comments)    "pt does not eat"  . Shellfish-Derived Products Other (See Comments)    "pt does not eat"     Current outpatient prescriptions:  .  ALPRAZolam (XANAX) 0.25 MG tablet, Take 1 tablet (0.25 mg total) by mouth 3 (three) times daily as needed for anxiety. (Patient taking differently: Take 0.25 mg by mouth daily. ), Disp: 60 tablet, Rfl: 0 .  dexlansoprazole (DEXILANT) 60 MG capsule, Take 60 mg by mouth 2 (two) times daily., Disp: , Rfl:  .  ezetimibe (ZETIA) 10 MG tablet, Take 1 tablet (10 mg total) by mouth daily., Disp: 90 tablet, Rfl: 3 .  furosemide (LASIX) 40 MG tablet, Take 1 tablet (40 mg total) by mouth daily., Disp: 30 tablet, Rfl: 1 .  iron polysaccharides (NIFEREX) 150 MG capsule, Take 450 mg by mouth 2 (two) times daily. , Disp: , Rfl:  .  isosorbide mononitrate (IMDUR) 60 MG 24 hr tablet, Take 1 tablet (60 mg total) by mouth daily. (Patient taking differently: Take 30 mg by mouth 2 (two) times daily. ), Disp: 30 tablet, Rfl: 1 .  meperidine (DEMEROL) 50 MG tablet, Take 1 tablet (50 mg total) by mouth every 6 (six) hours as needed. For pain, Disp: 30 tablet, Rfl: 0 .  metoprolol succinate (TOPROL-XL) 50 MG 24 hr tablet, Take 50 mg by mouth 2 (two) times daily. Take with or immediately following a meal., Disp: , Rfl:  .  nitroGLYCERIN (NITROSTAT) 0.4 MG SL tablet,  Place 1 tablet (0.4 mg total) under the tongue every 5 (five) minutes as needed. For chest pain, Disp: 25 tablet, Rfl: 2 .  omega-3 acid ethyl esters (LOVAZA) 1 G capsule, Take 2 g by mouth 2 (two) times daily. , Disp: , Rfl:  .  pioglitazone-metformin (ACTOPLUS MET) 15-850 MG per tablet, Take 1 tablet by mouth daily. Brand name only-does not work if components are given separately per patient 15-850 MG, Disp: , Rfl:  .  potassium chloride (K-DUR) 10 MEQ tablet, Take 1 tablet (10 mEq total) by mouth 2 (two) times daily., Disp: 60 tablet, Rfl: 1 .  Probiotic Product (ACIDOPHILUS PEARLS) CAPS, Take 1 capsule by mouth daily., Disp: , Rfl:  .  prochlorperazine (COMPAZINE) 10 MG tablet, Take 1 tablet (10 mg total) by mouth every 6 (six) hours as needed. For nausea, Disp: 30 tablet, Rfl: 2 .  ramipril (ALTACE) 1.25 MG capsule, Take 1 capsule (1.25 mg total) by mouth 2 (two) times daily. (Patient taking differently: Take 1.25 mg by mouth daily. ), Disp: 60 capsule, Rfl: 1 .  rosuvastatin (CRESTOR) 10 MG tablet, Take 1 tablet (10 mg total) by mouth every 7 (seven) days. Takes on Monday's (Patient taking differently: Take 10 mg by mouth every Monday. Takes on Monday's), Disp: 30 tablet, Rfl: 3 .  sitaGLIPtan (JANUVIA) 100 MG tablet, Take 100 mg by mouth at bedtime. , Disp: , Rfl:  .  Vitamin D, Ergocalciferol, (DRISDOL) 50000 UNITS CAPS, Take 1 capsule (50,000 Units total) by mouth every 7 (seven) days. (Patient taking differently: Take 50,000 Units by mouth every Monday, Wednesday, and Friday. ), Disp: 30 capsule, Rfl: 1 No current facility-administered medications for this visit.  Facility-Administered Medications Ordered in Other Visits:  .  furosemide (LASIX) injection 29 mg, 29 mg, Intravenous, Once, Medication Radiologist, MD  Filed Vitals:   12/30/14 1032  BP: 115/51  Pulse: 80  Height: 5' 4.8" (1.646 m)  Weight: 142 lb (64.411 kg)  SpO2: 99%    Body mass index is 23.77  kg/(m^2).       On physical exam I do not hear carotid bruits bilaterally. She has brachial pulses but no radial or ulnar pulses bilaterally She is grossly intact neurologically. Do not palpate pedal pulses on the left. She does have moderate edema.  She underwent noninvasive studies in our office yesterday and I reviewed them with the patient. This shows widely patent left femoral-popliteal bypass. She does have known narrowing in her native popliteal artery distal to the bypass. This is  no change since her last study  Carotid evaluation reveals widely patent left endarterectomy. On the right there is dampened flow in her right common carotid artery and the mid internal carotid was no evidence of critical stenosis.  Impression and plan diffuse for faster occlusive disease. She we'll continue her ambulation is much as possible. She'll notify should she develop any focal neurologic deficits. Otherwise we will see her again in 6 months with a repeat lower extremity studies and carotid duplex. She'll notify should she develop new ischemic symptoms in her left leg

## 2015-01-06 ENCOUNTER — Other Ambulatory Visit (HOSPITAL_COMMUNITY): Payer: Medicare Other

## 2015-01-06 ENCOUNTER — Ambulatory Visit: Payer: Medicare Other | Admitting: Vascular Surgery

## 2015-01-06 ENCOUNTER — Encounter (HOSPITAL_COMMUNITY): Payer: Medicare Other

## 2015-02-10 ENCOUNTER — Encounter (HOSPITAL_BASED_OUTPATIENT_CLINIC_OR_DEPARTMENT_OTHER): Payer: Medicare Other | Attending: General Surgery

## 2015-02-10 DIAGNOSIS — L98491 Non-pressure chronic ulcer of skin of other sites limited to breakdown of skin: Secondary | ICD-10-CM | POA: Diagnosis not present

## 2015-02-10 DIAGNOSIS — I7091 Generalized atherosclerosis: Secondary | ICD-10-CM | POA: Diagnosis not present

## 2015-02-10 DIAGNOSIS — E11622 Type 2 diabetes mellitus with other skin ulcer: Secondary | ICD-10-CM | POA: Diagnosis not present

## 2015-04-28 ENCOUNTER — Encounter (HOSPITAL_BASED_OUTPATIENT_CLINIC_OR_DEPARTMENT_OTHER): Payer: Medicare Other | Attending: General Surgery

## 2015-04-28 DIAGNOSIS — E11622 Type 2 diabetes mellitus with other skin ulcer: Secondary | ICD-10-CM | POA: Diagnosis not present

## 2015-04-28 DIAGNOSIS — S61202D Unspecified open wound of right middle finger without damage to nail, subsequent encounter: Secondary | ICD-10-CM | POA: Diagnosis present

## 2015-04-28 DIAGNOSIS — X58XXXD Exposure to other specified factors, subsequent encounter: Secondary | ICD-10-CM | POA: Diagnosis not present

## 2015-04-28 LAB — GLUCOSE, CAPILLARY: GLUCOSE-CAPILLARY: 87 mg/dL (ref 65–99)

## 2015-05-13 ENCOUNTER — Other Ambulatory Visit: Payer: Self-pay | Admitting: Cardiovascular Disease

## 2015-05-13 NOTE — Patient Outreach (Signed)
[facility]) Care Management  05/13/2015  Rayhana Marden [birth year] [id number]   Referral from [employer], assigned Elmer Picker, RN to outreach for [facility] Care Management services.  Thanks, Corrie Mckusick. Sharlee Blew [facility] Care Management [facility] CM Assistant Phone: [phone] Fax: [phone]

## 2015-06-05 ENCOUNTER — Ambulatory Visit (INDEPENDENT_AMBULATORY_CARE_PROVIDER_SITE_OTHER)
Admission: RE | Admit: 2015-06-05 | Discharge: 2015-06-05 | Disposition: A | Discharge status: Home / Self Care | Payer: Medicare Other | Source: Ambulatory Visit | Attending: Vascular Surgery | Admitting: Vascular Surgery

## 2015-06-05 ENCOUNTER — Ambulatory Visit (INDEPENDENT_AMBULATORY_CARE_PROVIDER_SITE_OTHER): Payer: Medicare Other | Admitting: Vascular Surgery

## 2015-06-05 ENCOUNTER — Inpatient Hospital Stay (HOSPITAL_COMMUNITY): Payer: Medicare Other

## 2015-06-05 ENCOUNTER — Inpatient Hospital Stay (HOSPITAL_COMMUNITY)
Admission: AD | Admit: 2015-06-05 | Discharge: 2015-06-08 | DRG: 300 | Disposition: A | Discharge status: Home / Self Care | Payer: Medicare Other | Source: Ambulatory Visit | Attending: Vascular Surgery | Admitting: Vascular Surgery

## 2015-06-05 ENCOUNTER — Encounter: Payer: Self-pay | Admitting: Vascular Surgery

## 2015-06-05 VITALS — BP 132/44 | HR 73 | Temp 98.1°F | Ht 64.8 in | Wt 142.0 lb

## 2015-06-05 DIAGNOSIS — L03116 Cellulitis of left lower limb: Secondary | ICD-10-CM | POA: Diagnosis present

## 2015-06-05 DIAGNOSIS — I70213 Atherosclerosis of native arteries of extremities with intermittent claudication, bilateral legs: Secondary | ICD-10-CM | POA: Diagnosis present

## 2015-06-05 DIAGNOSIS — I6523 Occlusion and stenosis of bilateral carotid arteries: Secondary | ICD-10-CM

## 2015-06-05 DIAGNOSIS — L02619 Cutaneous abscess of unspecified foot: Secondary | ICD-10-CM | POA: Diagnosis present

## 2015-06-05 DIAGNOSIS — I2510 Atherosclerotic heart disease of native coronary artery without angina pectoris: Secondary | ICD-10-CM | POA: Diagnosis present

## 2015-06-05 DIAGNOSIS — Z886 Allergy status to analgesic agent status: Secondary | ICD-10-CM

## 2015-06-05 DIAGNOSIS — E785 Hyperlipidemia, unspecified: Secondary | ICD-10-CM | POA: Diagnosis present

## 2015-06-05 DIAGNOSIS — I70245 Atherosclerosis of native arteries of left leg with ulceration of other part of foot: Secondary | ICD-10-CM | POA: Diagnosis not present

## 2015-06-05 DIAGNOSIS — I739 Peripheral vascular disease, unspecified: Secondary | ICD-10-CM

## 2015-06-05 DIAGNOSIS — Z91013 Allergy to seafood: Secondary | ICD-10-CM

## 2015-06-05 DIAGNOSIS — E11621 Type 2 diabetes mellitus with foot ulcer: Secondary | ICD-10-CM | POA: Diagnosis present

## 2015-06-05 DIAGNOSIS — I6529 Occlusion and stenosis of unspecified carotid artery: Secondary | ICD-10-CM | POA: Diagnosis present

## 2015-06-05 DIAGNOSIS — I7300 Raynaud's syndrome without gangrene: Secondary | ICD-10-CM | POA: Diagnosis present

## 2015-06-05 DIAGNOSIS — L97529 Non-pressure chronic ulcer of other part of left foot with unspecified severity: Secondary | ICD-10-CM | POA: Diagnosis present

## 2015-06-05 DIAGNOSIS — Z87891 Personal history of nicotine dependence: Secondary | ICD-10-CM | POA: Diagnosis not present

## 2015-06-05 DIAGNOSIS — I10 Essential (primary) hypertension: Secondary | ICD-10-CM | POA: Diagnosis present

## 2015-06-05 DIAGNOSIS — Z89519 Acquired absence of unspecified leg below knee: Secondary | ICD-10-CM

## 2015-06-05 DIAGNOSIS — I771 Stricture of artery: Secondary | ICD-10-CM | POA: Diagnosis present

## 2015-06-05 DIAGNOSIS — L03119 Cellulitis of unspecified part of limb: Secondary | ICD-10-CM

## 2015-06-05 DIAGNOSIS — Z885 Allergy status to narcotic agent status: Secondary | ICD-10-CM | POA: Diagnosis not present

## 2015-06-05 DIAGNOSIS — I252 Old myocardial infarction: Secondary | ICD-10-CM

## 2015-06-05 DIAGNOSIS — I7025 Atherosclerosis of native arteries of other extremities with ulceration: Secondary | ICD-10-CM

## 2015-06-05 DIAGNOSIS — Z91012 Allergy to eggs: Secondary | ICD-10-CM | POA: Diagnosis not present

## 2015-06-05 DIAGNOSIS — G8918 Other acute postprocedural pain: Secondary | ICD-10-CM

## 2015-06-05 DIAGNOSIS — E1149 Type 2 diabetes mellitus with other diabetic neurological complication: Secondary | ICD-10-CM | POA: Diagnosis present

## 2015-06-05 DIAGNOSIS — Z888 Allergy status to other drugs, medicaments and biological substances status: Secondary | ICD-10-CM

## 2015-06-05 DIAGNOSIS — Z789 Other specified health status: Secondary | ICD-10-CM

## 2015-06-05 DIAGNOSIS — K219 Gastro-esophageal reflux disease without esophagitis: Secondary | ICD-10-CM | POA: Diagnosis present

## 2015-06-05 LAB — COMPREHENSIVE METABOLIC PANEL
ALK PHOS: 65 U/L (ref 38–126)
ALT: 9 U/L — AB (ref 14–54)
AST: 14 U/L — ABNORMAL LOW (ref 15–41)
Albumin: 3.3 g/dL — ABNORMAL LOW (ref 3.5–5.0)
Anion gap: 8 (ref 5–15)
BUN: 28 mg/dL — ABNORMAL HIGH (ref 6–20)
CALCIUM: 9.3 mg/dL (ref 8.9–10.3)
CO2: 27 mmol/L (ref 22–32)
CREATININE: 1.44 mg/dL — AB (ref 0.44–1.00)
Chloride: 101 mmol/L (ref 101–111)
GFR, EST AFRICAN AMERICAN: 42 mL/min — AB (ref >60)
GFR, EST NON AFRICAN AMERICAN: 36 mL/min — AB (ref >60)
Glucose, Bld: 221 mg/dL — ABNORMAL HIGH (ref 65–99)
Potassium: 4.2 mmol/L (ref 3.5–5.1)
Sodium: 136 mmol/L (ref 135–145)
Total Bilirubin: 0.3 mg/dL (ref 0.3–1.2)
Total Protein: 6.4 g/dL — ABNORMAL LOW (ref 6.5–8.1)

## 2015-06-05 LAB — CBC
HEMATOCRIT: 33.9 % — AB (ref 36.0–46.0)
HEMOGLOBIN: 10.8 g/dL — AB (ref 12.0–15.0)
MCH: 28.7 pg (ref 26.0–34.0)
MCHC: 31.9 g/dL (ref 30.0–36.0)
MCV: 90.2 fL (ref 78.0–100.0)
Platelets: 142 10*3/uL — ABNORMAL LOW (ref 150–400)
RBC: 3.76 MIL/uL — AB (ref 3.87–5.11)
RDW: 14.8 % (ref 11.5–15.5)
WBC: 4.3 10*3/uL (ref 4.0–10.5)

## 2015-06-05 LAB — PROTIME-INR
INR: 1.01 (ref 0.00–1.49)
PROTHROMBIN TIME: 13.5 s (ref 11.6–15.2)

## 2015-06-05 LAB — GLUCOSE, CAPILLARY: Glucose-Capillary: 173 mg/dL — ABNORMAL HIGH (ref 65–99)

## 2015-06-05 MED ORDER — PHENOL 1.4 % MT LIQD: 1.0000 | OROMUCOSAL | Status: DC | PRN

## 2015-06-05 MED ORDER — ALPRAZOLAM 0.25 MG PO TABS: 0.2500 mg | ORAL_TABLET | Freq: Two times a day (BID) | ORAL | Status: DC | PRN

## 2015-06-05 MED ORDER — PIOGLITAZONE HCL-METFORMIN HCL 15-850 MG PO TABS: 1.0000 | ORAL_TABLET | Freq: Every day | ORAL | Status: DC

## 2015-06-05 MED ORDER — HEPARIN BOLUS VIA INFUSION
4000.0000 [IU] | Freq: Once | INTRAVENOUS | Status: AC
  Administered 2015-06-05: 4000 [IU] via INTRAVENOUS
  Filled 2015-06-05: qty 4000

## 2015-06-05 MED ORDER — HEPARIN (PORCINE) IN NACL 100-0.45 UNIT/ML-% IJ SOLN
1300.0000 [IU]/h | INTRAMUSCULAR | Status: DC
  Administered 2015-06-05: 1300 [IU]/h via INTRAVENOUS
  Filled 2015-06-05: qty 250

## 2015-06-05 MED ORDER — VITAMIN D (ERGOCALCIFEROL) 1.25 MG (50000 UNIT) PO CAPS
50000.0000 [IU] | ORAL_CAPSULE | ORAL | Status: DC
  Administered 2015-06-08: 50000 [IU] via ORAL
  Filled 2015-06-05: qty 1

## 2015-06-05 MED ORDER — PIOGLITAZONE HCL 15 MG PO TABS
15.0000 mg | ORAL_TABLET | Freq: Every day | ORAL | Status: DC
  Filled 2015-06-05: qty 1

## 2015-06-05 MED ORDER — HYDRALAZINE HCL 20 MG/ML IJ SOLN: 5.0000 mg | INTRAMUSCULAR | Status: DC | PRN

## 2015-06-05 MED ORDER — POTASSIUM CHLORIDE CRYS ER 20 MEQ PO TBCR
20.0000 meq | EXTENDED_RELEASE_TABLET | Freq: Once | ORAL | Status: DC
Start: 2015-06-05 — End: 2015-06-08

## 2015-06-05 MED ORDER — METOPROLOL TARTRATE 1 MG/ML IV SOLN: 2.0000 mg | INTRAVENOUS | Status: DC | PRN

## 2015-06-05 MED ORDER — SITAGLIPTIN PHOSPHATE 100 MG PO TABS
100.0000 mg | ORAL_TABLET | Freq: Every day | ORAL | Status: DC
  Administered 2015-06-06 – 2015-06-07 (×2): 100 mg via ORAL

## 2015-06-05 MED ORDER — EZETIMIBE 10 MG PO TABS
10.0000 mg | ORAL_TABLET | Freq: Every day | ORAL | Status: DC
  Administered 2015-06-06 – 2015-06-08 (×3): 10 mg via ORAL
  Filled 2015-06-05 (×3): qty 1

## 2015-06-05 MED ORDER — GUAIFENESIN-DM 100-10 MG/5ML PO SYRP: 15.0000 mL | ORAL_SOLUTION | ORAL | Status: DC | PRN

## 2015-06-05 MED ORDER — ACETAMINOPHEN 325 MG RE SUPP: 325.0000 mg | RECTAL | Status: DC | PRN

## 2015-06-05 MED ORDER — SODIUM CHLORIDE 0.9 % IV SOLN
INTRAVENOUS | Status: DC
  Administered 2015-06-05 – 2015-06-07 (×3): via INTRAVENOUS

## 2015-06-05 MED ORDER — ENOXAPARIN SODIUM 40 MG/0.4ML ~~LOC~~ SOLN: 40.0000 mg | SUBCUTANEOUS | Status: DC

## 2015-06-05 MED ORDER — SENNOSIDES-DOCUSATE SODIUM 8.6-50 MG PO TABS: 1.0000 | ORAL_TABLET | Freq: Every evening | ORAL | Status: DC | PRN

## 2015-06-05 MED ORDER — PROCHLORPERAZINE MALEATE 10 MG PO TABS
10.0000 mg | ORAL_TABLET | Freq: Four times a day (QID) | ORAL | Status: DC | PRN
  Filled 2015-06-05: qty 1

## 2015-06-05 MED ORDER — RAMIPRIL 1.25 MG PO CAPS
1.2500 mg | ORAL_CAPSULE | Freq: Every day | ORAL | Status: DC
  Administered 2015-06-06 – 2015-06-08 (×3): 1.25 mg via ORAL
  Filled 2015-06-05: qty 1

## 2015-06-05 MED ORDER — VANCOMYCIN HCL IN DEXTROSE 1-5 GM/200ML-% IV SOLN
1000.0000 mg | Freq: Once | INTRAVENOUS | Status: AC
  Administered 2015-06-06: 1000 mg via INTRAVENOUS
  Filled 2015-06-05 (×3): qty 200

## 2015-06-05 MED ORDER — OMEGA-3-ACID ETHYL ESTERS 1 G PO CAPS
2.0000 g | ORAL_CAPSULE | Freq: Two times a day (BID) | ORAL | Status: DC
  Administered 2015-06-06 – 2015-06-08 (×5): 2 g via ORAL
  Filled 2015-06-05 (×6): qty 2

## 2015-06-05 MED ORDER — ACIDOPHILUS PEARLS PO CAPS: 1.0000 | ORAL_CAPSULE | Freq: Every day | ORAL | Status: DC

## 2015-06-05 MED ORDER — ALUM & MAG HYDROXIDE-SIMETH 200-200-20 MG/5ML PO SUSP: 15.0000 mL | ORAL | Status: DC | PRN

## 2015-06-05 MED ORDER — ISOSORBIDE MONONITRATE ER 30 MG PO TB24
30.0000 mg | ORAL_TABLET | Freq: Two times a day (BID) | ORAL | Status: DC
  Administered 2015-06-05 – 2015-06-08 (×6): 30 mg via ORAL
  Filled 2015-06-05 (×6): qty 1

## 2015-06-05 MED ORDER — METFORMIN HCL 850 MG PO TABS
850.0000 mg | ORAL_TABLET | Freq: Every day | ORAL | Status: DC
  Filled 2015-06-05: qty 1

## 2015-06-05 MED ORDER — METOPROLOL SUCCINATE ER 50 MG PO TB24
50.0000 mg | ORAL_TABLET | Freq: Two times a day (BID) | ORAL | Status: DC
  Administered 2015-06-05 – 2015-06-08 (×6): 50 mg via ORAL
  Filled 2015-06-05 (×6): qty 1

## 2015-06-05 MED ORDER — DOCUSATE SODIUM 100 MG PO CAPS
100.0000 mg | ORAL_CAPSULE | Freq: Two times a day (BID) | ORAL | Status: DC
Start: 2015-06-05 — End: 2015-06-08
  Administered 2015-06-06 – 2015-06-07 (×2): 100 mg via ORAL
  Filled 2015-06-05 (×2): qty 1

## 2015-06-05 MED ORDER — LABETALOL HCL 5 MG/ML IV SOLN
10.0000 mg | INTRAVENOUS | Status: DC | PRN
  Filled 2015-06-05: qty 4

## 2015-06-05 MED ORDER — ACETAMINOPHEN 325 MG PO TABS: 325.0000 mg | ORAL_TABLET | ORAL | Status: DC | PRN

## 2015-06-05 MED ORDER — MEPERIDINE HCL 50 MG PO TABS: 100.0000 mg | ORAL_TABLET | ORAL | Status: DC | PRN

## 2015-06-05 MED ORDER — LEVOTHYROXINE SODIUM 75 MCG PO TABS
75.0000 ug | ORAL_TABLET | Freq: Every day | ORAL | Status: DC
  Filled 2015-06-05: qty 1

## 2015-06-05 MED ORDER — POLYSACCHARIDE IRON COMPLEX 150 MG PO CAPS
450.0000 mg | ORAL_CAPSULE | Freq: Two times a day (BID) | ORAL | Status: DC
  Administered 2015-06-06 – 2015-06-07 (×3): 450 mg via ORAL
  Filled 2015-06-05 (×3): qty 3

## 2015-06-05 MED ORDER — RISAQUAD PO CAPS
1.0000 | ORAL_CAPSULE | Freq: Every day | ORAL | Status: DC
  Administered 2015-06-06 – 2015-06-08 (×2): 1 via ORAL
  Filled 2015-06-05 (×3): qty 1

## 2015-06-05 MED ORDER — FUROSEMIDE 40 MG PO TABS
40.0000 mg | ORAL_TABLET | Freq: Every day | ORAL | Status: DC
  Administered 2015-06-06 – 2015-06-08 (×3): 40 mg via ORAL
  Filled 2015-06-05 (×3): qty 1

## 2015-06-05 MED ORDER — INSULIN ASPART 100 UNIT/ML ~~LOC~~ SOLN
0.0000 [IU] | Freq: Three times a day (TID) | SUBCUTANEOUS | Status: DC
  Administered 2015-06-06 – 2015-06-07 (×3): 3 [IU] via SUBCUTANEOUS
  Administered 2015-06-07 (×2): 2 [IU] via SUBCUTANEOUS
  Administered 2015-06-08: 3 [IU] via SUBCUTANEOUS

## 2015-06-05 MED ORDER — PANTOPRAZOLE SODIUM 40 MG PO TBEC: 40.0000 mg | DELAYED_RELEASE_TABLET | Freq: Every day | ORAL | Status: DC

## 2015-06-05 MED ORDER — LINAGLIPTIN 5 MG PO TABS: 5.0000 mg | ORAL_TABLET | Freq: Every day | ORAL | Status: DC

## 2015-06-05 MED ORDER — POTASSIUM CHLORIDE ER 10 MEQ PO TBCR
10.0000 meq | EXTENDED_RELEASE_TABLET | Freq: Two times a day (BID) | ORAL | Status: DC
  Administered 2015-06-05 – 2015-06-08 (×6): 10 meq via ORAL
  Filled 2015-06-05 (×12): qty 1

## 2015-06-05 MED ORDER — PIOGLITAZONE HCL-METFORMIN HCL 15-850 MG PO TABS
1.0000 | ORAL_TABLET | Freq: Every day | ORAL | Status: DC
  Administered 2015-06-05 – 2015-06-07 (×3): 1 via ORAL
  Filled 2015-06-05: qty 1

## 2015-06-05 MED ORDER — NITROGLYCERIN 0.4 MG SL SUBL: 0.4000 mg | SUBLINGUAL_TABLET | SUBLINGUAL | Status: DC | PRN

## 2015-06-05 MED ORDER — ONDANSETRON HCL 4 MG/2ML IJ SOLN: 4.0000 mg | Freq: Four times a day (QID) | INTRAMUSCULAR | Status: DC | PRN

## 2015-06-05 MED ORDER — MORPHINE SULFATE (PF) 2 MG/ML IV SOLN: 2.0000 mg | INTRAVENOUS | Status: DC | PRN

## 2015-06-05 MED ORDER — DIPHENHYDRAMINE HCL 50 MG/ML IJ SOLN: 12.5000 mg | Freq: Four times a day (QID) | INTRAMUSCULAR | Status: DC | PRN

## 2015-06-05 MED ORDER — ROSUVASTATIN CALCIUM 10 MG PO TABS
10.0000 mg | ORAL_TABLET | ORAL | Status: DC
  Administered 2015-06-08: 10 mg via ORAL
  Filled 2015-06-05: qty 1

## 2015-06-05 MED ORDER — SODIUM CHLORIDE 0.9 % IJ SOLN
10.0000 mL | INTRAMUSCULAR | Status: DC | PRN
Start: 2015-06-05 — End: 2015-06-08
  Administered 2015-06-05: 20 mL
  Administered 2015-06-05: 30 mL
  Administered 2015-06-06 – 2015-06-08 (×4): 10 mL
  Filled 2015-06-05 (×5): qty 40

## 2015-06-05 NOTE — Progress Notes (Signed)
Pt. is here for a PICC line that will not be able to be placed today, but refuses to have an IV placed because she states she is a hard. I have talk Pt. and husbands wants to leave because of this situation. We have called their Dr. Claudie Fisherman office and talked to Okey Regal who said she would get in touch with Dr. Darrick Penna or his PA to find out what they should do. But couple is strongly desiring leave. Teresa Coombs 5:08 PM

## 2015-06-05 NOTE — Progress Notes (Addendum)
Pt. arrived on unit via wheelchair as direct admit accompanied by husband Teresa Coombs 4:27 PM

## 2015-06-05 NOTE — Progress Notes (Signed)
Pt. Still refuses to get in bed, take meds, and be placed on tele. Teresa Coombs 5:21 PM

## 2015-06-05 NOTE — Progress Notes (Signed)
Talked with PA Marisue Humble about patient. We opted to go with a central line. We have paged Critical Care MD to do line. Teresa Coombs 5:13 PM

## 2015-06-05 NOTE — Procedures (Signed)
CENTRAL VENOUS CATHETER INSERTION   Indication: Vascular access  Consent: yes Time out: yes Appropriate position: yes Hand washing: yes Patient Sterilized and Draped: yes Location: Right IJ # of Attempts: 1 Ultrasound Guidance: yes Wire Confirmed with [place]: yes Insertion depth: 20 cm All Ports Draw and flush: yes CXR:   Pneumothorax: no  Line position appropriate: yes Line sutured in place: yes EBL: 2 cc Complications: no Patient Tolerated Procedure Well: yes     Galvin Proffer, DO., MS [facility]

## 2015-06-05 NOTE — Progress Notes (Signed)
Pt. still refuse care until line is placed. Charge nurse aware. Teresa Coombs 5:47 PM

## 2015-06-05 NOTE — Progress Notes (Addendum)
HISTORY AND PHYSICAL    History of Present Illness:  Patient is a 69 y.o. year old female who presents for evaluation of Left foot edema with cellulitis and open wounds between her toes.  These symptoms started 1 week ago and has gotten progressively  worse.  She was seen by her primary care physician today and he sent her over for evaluation.  Her past surgical history includes:Other medical problems include aorto-bifem BPG 2013, left fem-pop BPG 2009, and right BKA 2013.  She has Diabetes mellitus type 2 with neurological manifestations ([place]); Hyperlipemia; HYPERTENSION, UNSPECIFIED; CORONARY ATHEROSCLEROSIS NATIVE CORONARY ARTERY; CAROTID OCCLUSIVE DISEASE; UNSPECIFIED PERIPHERAL VASCULAR DISEASE; Atherosclerosis of native arteries of extremity with intermittent claudication ([place]); Atherosclerosis of native arteries of the extremities with ulceration(440.23); Chronic ulcer of unspecified site; Cellulitis; Atherosclerotic PVD with intermittent claudication ([place]); Pain in limb; Respiratory failure, post-operative ([place]); Pulmonary edema, acute ([place]); Acute combined systolic and diastolic heart failure ([place]); Acute myocardial infarction, subendocardial infarction, initial episode of care [facility]); S/P BKA (below knee amputation) ([place]); Non-healing surgical wound; Fall from slipping; PVD (peripheral vascular disease) ([place]); Aftercare following surgery of the circulatory system, NEC; UTI (lower urinary tract infection); and Encounter for central line placement on her problem list.   Past Medical History  Diagnosis Date  . Hypertension     Unspecified  . Hyperlipidemia     Mixed  . Tobacco abuse     Remote  . Loose, teeth     has loose bridge and two loose teeth holding it  . PONV (postoperative nausea and vomiting)   . Type II diabetes mellitus ([place])   . GERD (gastroesophageal reflux disease)   . Anxiety   . Peripheral vascular disease (HCC)     unspecified, a. s/p L CEA b. s/p B fem-pop bypass   . Raynaud disease   . Stones in the urinary tract   . UTI (urinary tract infection)   . Anemia     hx  . Coronary artery disease     a. s/p NSTEMI 02/2009 - PCI LCX with Xience DES. Otherwise branch vessel and Dist. RCA dzs. NL EF.   Marland Kitchen Myocardial infarction ([place])   . CHF (congestive heart failure) ([place])   . Kidney stones   . UTI (lower urinary tract infection)   . Constipation   . Hernia, umbilical   . History of blood transfusion   . Fall from slipping on wet surface Jan. 7, 2014    Right stump bleeding from fall  . Carotid artery occlusion     Past Surgical History  Procedure Laterality Date  . Vesicovaginal fistula closure w/ tah    . Colonoscopy  11/2010  . Upper gastrointestinal endoscopy  11/2010  . Femoral artery - popliteal artery bypass graft      left  . Abdominal hysterectomy      complete  . Femoral-popliteal bypass graft  08/22/2011    Procedure: BYPASS GRAFT FEMORAL-POPLITEAL ARTERY;  Surgeon: Curt Jews, MD;  Location: [place];  Service: Vascular;  Laterality: Right;  Thrombectomy and Revision using 63m x 10cm stretch goretex graft  . Intraoperative arteriogram  08/22/2011    Procedure: INTRA OPERATIVE ARTERIOGRAM;  Surgeon: TCurt Jews MD;  [facility];  Service: Vascular;  Laterality: Right;  to lower leg  . Multiple tooth extractions  08-29-2011    5 teeth extracted   . Pci  01/17/12    RLE  . Breast lumpectomy      right  .  Tonsillectomy and adenoidectomy    . Coronary angioplasty with stent placement    . Carotid endarterectomy  ~ 2005    left  . Aorta - bilateral femoral artery bypass graft  03/26/2012    Procedure: AORTA BIFEMORAL BYPASS GRAFT;  Surgeon: Rosetta Posner, MD;  Location: [facility] OR;  Service: Vascular;  Laterality: N/A;  Aortic-bifemoral bypass using 14x103mm Hemashield graft .   . Umbilical hernia repair  03/26/2012    Procedure: HERNIA REPAIR UMBILICAL ADULT;  Surgeon: Rosetta Posner, MD;  Location: [facility] OR;  Service: Vascular;  Laterality: N/A;   Removal of Umbilical hernia sac  . Femoral artery exploration  03/26/2012    Procedure: FEMORAL ARTERY EXPLORATION;  Surgeon: Rosetta Posner, MD;  Location: [facility] OR;  Service: Vascular;  Laterality: Right;  with Revision of Popliteal-Peroneal bypass graft using 56mm x 10cm thin wall goretex graft  . Amputation  04/23/2012    Procedure: AMPUTATION BELOW KNEE;  Surgeon: Rosetta Posner, MD;  Location: [place];  Service: Vascular;  Laterality: Right;  . Eye surgery Right Feb. 9, 2015    Cataract  . Eye surgery Left Feb. 16, 2015    Cataract  . Abdominal aortagram N/A 05/17/2011    Procedure: ABDOMINAL AORTAGRAM;  Surgeon: Serafina Mitchell, MD;  Location: [facility] CATH LAB;  Service: Cardiovascular;  Laterality: N/A;  . Lower extremity angiogram  05/17/2011    Procedure: LOWER EXTREMITY ANGIOGRAM;  Surgeon: Serafina Mitchell, MD;  [facility];  Service: Cardiovascular;;  . Percutaneous stent intervention Right 05/17/2011    Procedure: PERCUTANEOUS STENT INTERVENTION;  Surgeon: Serafina Mitchell, MD;  Location: [facility] CATH LAB;  Service: Cardiovascular;  Laterality: Right;  . Lower extremity angiogram N/A 09/21/2011    Procedure: LOWER EXTREMITY ANGIOGRAM;  Surgeon: Serafina Mitchell, MD;  Location: [facility] CATH LAB;  Service: Cardiovascular;  Laterality: N/A;  . Abdominal angiogram  09/21/2011    Procedure: ABDOMINAL ANGIOGRAM;  Surgeon: Serafina Mitchell, MD;  Location: [facility] CATH LAB;  Service: Cardiovascular;;  . Abdominal aortagram N/A 01/17/2012    Procedure: ABDOMINAL Maxcine Ham;  Surgeon: Serafina Mitchell, MD;  Location: [facility] CATH LAB;  Service: Cardiovascular;  Laterality: N/A;  . Left heart catheterization with coronary angiogram N/A 04/05/2012    Procedure: LEFT HEART CATHETERIZATION WITH CORONARY ANGIOGRAM;  Surgeon: Sherren Mocha, MD;  Location: [facility] CATH LAB;  Service: Cardiovascular;  Laterality: N/A;    Social History Social History  Substance Use Topics  . Smoking status: Former Smoker -- 2.00 packs/day for 39  years    Types: Cigarettes    Quit date: 07/05/2001  . Smokeless tobacco: Never Used  . Alcohol Use: 8.4 oz/week    14 Standard drinks or equivalent per week    Family History Family History  Problem Relation Age of Onset  . Heart attack Mother   . Heart attack Father   . Hypertension Brother   . Diabetes Brother   . Hypertension Brother     Allergies  Allergies  Allergen Reactions  . Acetaminophen Nausea Only and Other (See Comments)    Does not tolerate well, nausea  . Ceftriaxone Itching  . Codeine Nausea And Vomiting  . Erythromycin Nausea And Vomiting  . Hydrocodone-Acetaminophen Nausea And Vomiting  . Hydromorphone Nausea And Vomiting  . Ibuprofen Other (See Comments)    Does not tolerate well  . Propoxyphene Hcl Nausea And Vomiting  . Statins Other (See Comments)    Leg myalgias  . Eggs Or  Egg-Derived Products Other (See Comments)    "pt does not eat"  . Shellfish-Derived Products Other (See Comments)    "pt does not eat"     Current Outpatient Prescriptions  Medication Sig Dispense Refill  . ALPRAZolam (XANAX) 0.25 MG tablet Take 1 tablet (0.25 mg total) by mouth 3 (three) times daily as needed for anxiety. (Patient taking differently: Take 0.25 mg by mouth 2 (two) times daily as needed. ) 60 tablet 0  . dexlansoprazole (DEXILANT) 60 MG capsule Take 60 mg by mouth 2 (two) times daily.    . furosemide (LASIX) 40 MG tablet Take 1 tablet (40 mg total) by mouth daily. 30 tablet 1  . iron polysaccharides (NIFEREX) 150 MG capsule Take 450 mg by mouth 2 (two) times daily.     . isosorbide mononitrate (IMDUR) 60 MG 24 hr tablet Take 1 tablet (60 mg total) by mouth daily. (Patient taking differently: Take 30 mg by mouth 2 (two) times daily. ) 30 tablet 1  . meperidine (DEMEROL) 50 MG tablet Take 1 tablet (50 mg total) by mouth every 6 (six) hours as needed. For pain (Patient taking differently: Take 100 mg by mouth every 6 (six) hours as needed. For pain) 30 tablet 0   . metoprolol succinate (TOPROL-XL) 50 MG 24 hr tablet Take 50 mg by mouth 2 (two) times daily. Take with or immediately following a meal.    . nitroGLYCERIN (NITROSTAT) 0.4 MG SL tablet Place 1 tablet (0.4 mg total) under the tongue every 5 (five) minutes as needed. For chest pain 25 tablet 2  . omega-3 acid ethyl esters (LOVAZA) 1 G capsule Take 2 g by mouth 2 (two) times daily.     . pioglitazone-metformin (ACTOPLUS MET) 15-850 MG per tablet Take 1 tablet by mouth daily. Brand name only-does not work if components are given separately per patient 15-850 MG    . potassium chloride (K-DUR) 10 MEQ tablet Take 1 tablet (10 mEq total) by mouth 2 (two) times daily. 60 tablet 1  . Probiotic Product (ACIDOPHILUS PEARLS) CAPS Take 1 capsule by mouth daily.    . prochlorperazine (COMPAZINE) 10 MG tablet Take 1 tablet (10 mg total) by mouth every 6 (six) hours as needed. For nausea 30 tablet 2  . ramipril (ALTACE) 1.25 MG capsule Take 1 capsule (1.25 mg total) by mouth 2 (two) times daily. (Patient taking differently: Take 1.25 mg by mouth daily. ) 60 capsule 1  . rosuvastatin (CRESTOR) 10 MG tablet Take 1 tablet (10 mg total) by mouth every 7 (seven) days. Takes on Monday's (Patient taking differently: Take 10 mg by mouth every Monday. Takes on Monday's) 30 tablet 3  . sitaGLIPtan (JANUVIA) 100 MG tablet Take 100 mg by mouth at bedtime.     . Vitamin D, Ergocalciferol, (DRISDOL) 50000 UNITS CAPS Take 1 capsule (50,000 Units total) by mouth every 7 (seven) days. (Patient taking differently: Take 50,000 Units by mouth every Monday, Wednesday, and Friday. ) 30 capsule 1  . ZETIA 10 MG tablet TAKE 1 TABLET (10 MG TOTAL) BY MOUTH DAILY. 90 tablet 0   No current facility-administered medications for this visit.    ROS:   General:  No weight loss, Fever, chills  HEENT: No recent headaches, no nasal bleeding, no visual changes, no sore throat  Neurologic: No dizziness, blackouts, seizures. No recent  symptoms of stroke or mini- stroke. No recent episodes of slurred speech, or temporary blindness.  Cardiac: No recent episodes of chest pain/pressure, no  shortness of breath at rest.  No shortness of breath with exertion.  Denies history of atrial fibrillation or irregular heartbeat  Vascular: No history of rest pain in feet.  No history of claudication.  No history of non-healing ulcer, No history of DVT   Pulmonary: No home oxygen, no productive cough, no hemoptysis,  No asthma or wheezing  Musculoskeletal:  _0  Arthritis, _1  Low back pain,  _2  Joint pain  Hematologic:No history of hypercoagulable state.  No history of easy bleeding.  No history of anemia  Gastrointestinal: No hematochezia or melena,  No gastroesophageal reflux, no trouble swallowing  Urinary: _3  chronic Kidney disease, _4  on HD - _5  MWF or _6  TTHS, _7  Burning with urination, _8  Frequent urination, _9  Difficulty urinating;   Skin: No rashes  Psychological: No history of anxiety,  No history of depression   Physical Examination  Filed Vitals:   06/05/15 1321  BP: 132/44  Pulse: 73  Temp: 98.1 F (36.7 C)  TempSrc: Oral  Height: 5' 4.8" (1.646 m)  Weight: 142 lb (64.411 kg)  SpO2: 100%    Body mass index is 23.77 kg/(m^2).  General:  Alert and oriented, no acute distress HEENT: Normal Neck: No bruit or JVD Pulmonary: Clear to auscultation bilaterally Cardiac: Regular Rate and Rhythm without murmur Abdomen: Soft, non-tender, non-distended, no mass, no scars Skin: No rash Extremity Pulses:  2+ radial, brachial, left PT doppler signal Left foot: blanching erythema extending to proximal foot, ulcers between toes   DATA:   Left LE arterial duplex (06/05/2015)   Patent L fem-pop bypass with possible occlusion just distal to distal anastomosis  LLE ABI (06/05/2015)  L: ABI 0.50   ASSESSMENT:   Diffuse peripheral vascular occlusive disease s/p  aorto-bifem BPG 2013, left fem-pop BPG 2009, and  right BKA 2013. Cellulitis left foot with open wounds between all her toes.  She is afebrile with lab work pending from Her primary care physician's office drawn today.     PLAN:  We are recommending admission to the hospital and IV antibiotics.  She has a Gore-Tex graft s/p aortobifemoral by pass.  This needs to be protected from infection with soul become life thereating.     [facility] [place] Office: [phone]  Addendum  I have independently interviewed and examined the patient, and I agree with the physician assistant's findings.  Pt has frank cellulitis in the setting of prior ABF and L fem-pop bypass.  There may be compromise of the outflow from the L fem-pop BPG that is resulting the ischemia in the left foot, so intervention may be needed.  Given presence of PT signal in L leg, she does not have not have a frankly threatened limb.    - admit for IV abx and IV heparin and pain control - will need PICC for access given prior poor IV access - Dr. Oneida Alar aware of patient and is planning on angiogram on Monday to help delineate the anatomy  Adele Barthel, MD [facility] [place]: [phone] Pager: [phone]  06/05/2015, 3:03 PM

## 2015-06-05 NOTE — Consult Note (Addendum)
ANTICOAGULATION CONSULT NOTE - Initial Consult ANTIBIOTIC CONSULT NOTE - Initial Consult  Pharmacy Consult for Heparin and Vancomycin Indication: limb ischemia and cellulitis  Allergies  Allergen Reactions  . Acetaminophen Nausea Only and Other (See Comments)    Does not tolerate well, nausea  . Ceftriaxone Itching  . Codeine Nausea And Vomiting  . Erythromycin Nausea And Vomiting  . Hydrocodone-Acetaminophen Nausea And Vomiting  . Hydromorphone Nausea And Vomiting  . Ibuprofen Other (See Comments)    Does not tolerate well  . Propoxyphene Hcl Nausea And Vomiting  . Statins Other (See Comments)    Leg myalgias  . Eggs Or Egg-Derived Products Other (See Comments)    "pt does not eat"  . Shellfish-Derived Products Other (See Comments)    "pt does not eat"    Patient Measurements: Weight 64.4kg Height 5'4'' IBW 53kg  Vital Signs: Temp: 98.1 F (36.7 C) (12/02 1321) Temp Source: Oral (12/02 1321) BP: 132/44 mmHg (12/02 1321) Pulse Rate: 73 (12/02 1321)  Labs: No results for input(s): HGB, HCT, PLT, APTT, LABPROT, INR, HEPARINUNFRC, CREATININE, CKTOTAL, CKMB, TROPONINI in the last 72 hours.  CrCl cannot be calculated (Patient has no serum creatinine result on file.).   Medical History: Past Medical History  Diagnosis Date  . Hypertension     Unspecified  . Hyperlipidemia     Mixed  . Tobacco abuse     Remote  . Loose, teeth     has loose bridge and two loose teeth holding it  . PONV (postoperative nausea and vomiting)   . Type II diabetes mellitus (HCC)   . GERD (gastroesophageal reflux disease)   . Anxiety   . Peripheral vascular disease (HCC)     unspecified, a. s/p L CEA b. s/p B fem-pop bypass  . Raynaud disease   . Stones in the urinary tract   . UTI (urinary tract infection)   . Anemia     hx  . Coronary artery disease     a. s/p NSTEMI 02/2009 - PCI LCX with Xience DES. Otherwise branch vessel and Dist. RCA dzs. NL EF.   Marland Kitchen Myocardial infarction  (HCC)   . CHF (congestive heart failure) (HCC)   . Kidney stones   . UTI (lower urinary tract infection)   . Constipation   . Hernia, umbilical   . History of blood transfusion   . Fall from slipping on wet surface Jan. 7, 2014    Right stump bleeding from fall  . Carotid artery occlusion     Medications:  No anticoagulants pta  Assessment: [name] s/p left fem-pop bypass being admitted from home with suspected left foot ischemia and cellulitis. She will begin heparin with plan for angiogram on Monday. She will also being vancomycin for her cellulitis. Labs pending.  Goal of Therapy:  Heparin level 0.3-0.7 units/ml Monitor platelets by anticoagulation protocol: Yes   Plan:  1) Heparin bolus 4000 units x 1 2) Heparin at 1300 units/hr 3) Check 8 hour heparin level 4) Daily heparin level and CBC 5) Vancomycin 1g IV x 1 - f/u labs for further dosing  Fredrik Rigger 06/05/2015,4:44 PM  Addendum: SCr 1.44, est CrCl 32 ml/min.   Plan: Vancomycin 1gm IV q24h Will f/u renal function, micro data, and pt's clinical condition.  Christoper Fabian, PharmD, BCPS Clinical pharmacist, pager [phone] 06/06/2015 5:05 AM

## 2015-06-05 NOTE — Progress Notes (Signed)
eLink Physician-Brief Progress Note Patient Name: [name] DOB: 1945/10/03 MRN: [id number]   Date of Service  06/05/2015  HPI/Events of Note  Patient admitted with cellulitis and needs blood cultures  eICU Interventions  Will order blood cultures X 2.      Intervention Category Minor Interventions: Clinical assessment - ordering diagnostic tests  Lenell Antu 06/05/2015, 10:20 PM

## 2015-06-05 NOTE — Progress Notes (Signed)
MD Craige Cotta said he has patient on the list to put in central line. Spoke with Darrick Penna, MD about the uncooperativeness of patient and husband, he has spoken with patient. Teresa Coombs 7:04 PM

## 2015-06-06 LAB — URINE MICROSCOPIC-ADD ON

## 2015-06-06 LAB — URINALYSIS, ROUTINE W REFLEX MICROSCOPIC
BILIRUBIN URINE: NEGATIVE
Glucose, UA: 250 mg/dL — AB
Ketones, ur: NEGATIVE mg/dL
NITRITE: POSITIVE — AB
PH: 6.5 (ref 5.0–8.0)
Protein, ur: NEGATIVE mg/dL
SPECIFIC GRAVITY, URINE: 1.013 (ref 1.005–1.030)

## 2015-06-06 LAB — CBC
HCT: 32.8 % — ABNORMAL LOW (ref 36.0–46.0)
HEMOGLOBIN: 10.4 g/dL — AB (ref 12.0–15.0)
MCH: 28.7 pg (ref 26.0–34.0)
MCHC: 31.7 g/dL (ref 30.0–36.0)
MCV: 90.6 fL (ref 78.0–100.0)
PLATELETS: 235 10*3/uL (ref 150–400)
RBC: 3.62 MIL/uL — AB (ref 3.87–5.11)
RDW: 14.9 % (ref 11.5–15.5)
WBC: 4.6 10*3/uL (ref 4.0–10.5)

## 2015-06-06 LAB — GLUCOSE, CAPILLARY
GLUCOSE-CAPILLARY: 152 mg/dL — AB (ref 65–99)
GLUCOSE-CAPILLARY: 119 mg/dL — AB (ref 65–99)
GLUCOSE-CAPILLARY: 169 mg/dL — AB (ref 65–99)

## 2015-06-06 LAB — HEPARIN LEVEL (UNFRACTIONATED)
HEPARIN UNFRACTIONATED: 1.04 [IU]/mL — AB (ref 0.30–0.70)
HEPARIN UNFRACTIONATED: 0.49 [IU]/mL (ref 0.30–0.70)

## 2015-06-06 MED ORDER — DEXLANSOPRAZOLE 60 MG PO CPDR
60.0000 mg | DELAYED_RELEASE_CAPSULE | Freq: Two times a day (BID) | ORAL | Status: DC
  Administered 2015-06-06 – 2015-06-08 (×4): 60 mg via ORAL
  Filled 2015-06-06 (×2): qty 1

## 2015-06-06 MED ORDER — HEPARIN (PORCINE) IN NACL 100-0.45 UNIT/ML-% IJ SOLN
800.0000 [IU]/h | INTRAMUSCULAR | Status: DC
  Administered 2015-06-06: 800 [IU]/h via INTRAVENOUS
  Administered 2015-06-08: 8 [IU]/h via INTRAVENOUS
  Filled 2015-06-06 (×2): qty 250

## 2015-06-06 MED ORDER — VANCOMYCIN HCL IN DEXTROSE 1-5 GM/200ML-% IV SOLN
1000.0000 mg | INTRAVENOUS | Status: DC
Start: 2015-06-06 — End: 2015-06-06
  Filled 2015-06-06: qty 200

## 2015-06-06 MED ORDER — VANCOMYCIN HCL IN DEXTROSE 750-5 MG/150ML-% IV SOLN
750.0000 mg | INTRAVENOUS | Status: DC
  Administered 2015-06-06 – 2015-06-07 (×2): 750 mg via INTRAVENOUS
  Filled 2015-06-06 (×3): qty 150

## 2015-06-06 NOTE — Progress Notes (Addendum)
  [facility] Progress Note  Subjective  - Left foot pain improved from yesterday.   Objective Filed Vitals:   06/06/15 0519 06/06/15 0543  BP: 175/51 151/45  Pulse: 76   Temp: 97.8 F (36.6 C)   Resp: 18     Intake/Output Summary (Last 24 hours) at 06/06/15 0802 Last data filed at 06/06/15 0500  Gross per 24 hour  Intake      0 ml  Output    300 ml  Net   -300 ml   Left foot with mild edema and erythema with ulcerations between toes. No drainage, no odor.   Assessment/Planning: 69 y.o. female with cellulitis and open wounds between all her toes.   Left lower extremity arterial duplex yesterday showing a patent left femoral-popliteal bypass with possible occlusion distal to distal anastomosis.  Continue heparin and IV antibiotics. She is afebrile without leukocytosis. Blood cultures drawn per protocol.  Patient is a "difficult stick" and refusing lab draws this am. Had central line placed yesterday because she was refusing IV. Patient did say she could be stuck with "small needle" in left arm and only by a "specialist." Will have IV team evaluate.   [name] 06/06/2015 8:02 AM -- Agree with above.  Pt currently on heparin for possible high grade outflow stenosis of fem pop She subjectively thinks leg swelling is improve.  Foot is pink warm, minimal ulceration of foot Mainly complaining about needlesticks Has central line Will try to minimize heparin level blood draws to once daily and slowly titrate up  Angio tentatively for Monday  [name], MD [facility] [place] Office: [phone] Pager: [phone]  Laboratory CBC    Component Value Date/Time   WBC 4.6 06/06/2015 0455   HGB 10.4* 06/06/2015 0455   HCT 32.8* 06/06/2015 0455   PLT 235 06/06/2015 0455    BMET    Component Value Date/Time   NA 136 06/05/2015 2237   K 4.2 06/05/2015 2237   CL 101 06/05/2015 2237   CO2 27 06/05/2015 2237   GLUCOSE  221* 06/05/2015 2237   BUN 28* 06/05/2015 2237   CREATININE 1.44* 06/05/2015 2237   CALCIUM 9.3 06/05/2015 2237   GFRNONAA 36* 06/05/2015 2237   GFRAA 42* 06/05/2015 2237    COAG Lab Results  Component Value Date   INR 1.01 06/05/2015   INR 0.94 05/14/2014   INR 1.18 03/26/2012   No results found for: PTT  Antibiotics Anti-infectives    Start     Dose/Rate Route Frequency Ordered Stop   06/06/15 2200  vancomycin (VANCOCIN) IVPB 1000 mg/200 mL premix     1,000 mg 200 mL/hr over 60 Minutes Intravenous Every 24 hours 06/06/15 0507     06/05/15 1700  vancomycin (VANCOCIN) IVPB 1000 mg/200 mL premix     1,000 mg 200 mL/hr over 60 Minutes Intravenous  Once 06/05/15 1653 06/06/15 0146       [name], PA-C [facility] Office: [phone] Pager: [phone] 06/06/2015 8:02 AM

## 2015-06-06 NOTE — Progress Notes (Signed)
Pt [name] site looks slightly swollen but infusing ok. IV team notified to look at site, IV team RN informed RN to notify MD after assessing site. Dr. Darrick Penna at bedside notified; no new order received; MD state it's swollen not enough to be worrisome. Will continue to closely monitor pt. Kaylee Bucy RN

## 2015-06-06 NOTE — Progress Notes (Signed)
PHARMACY NOTE  Pharmacy Consult for :  Heparin;  Vancomycin Indication:  Limb ischemia;  Cellulitis   Hospital Problems: Active Problems:   Cellulitis and abscess of foot  CURRENTLY: Heparin Dosing Weight: 64.4 kg 97.8 F (36.6 C) (Oral)  Lab Results  Component Value Date   WBC 4.6 06/06/2015   LABS:  Recent Labs Lab 06/05/15 2237 06/06/15 0455 06/06/15 0928  NA 136  --   --   K 4.2  --   --   CL 101  --   --   CO2 27  --   --   GLUCOSE 221*  --   --   BUN 28*  --   --   CREATININE 1.44*  --   --   CALCIUM 9.3  --   --   HGB 10.8* 10.4*  --   HCT 33.9* 32.8*  --   MCV 90.2 90.6  --   PLT 142* 235  --   INR 1.01  --   --   HEPARINUNFRC  --   --  1.04*    CrCl: Estimated Creatinine Clearance: 32.4 mL/min (by C-G formula based on Cr of 1.44).  MICROBIOLOGY: Lab Results  Component Value Date   CULT NO GROWTH Performed at [facility]  08/19/2014   CULT  11/04/2009    Multiple bacterial morphotypes present, none predominant. Suggest appropriate recollection if clinically indicated.   CULT  05/27/2008    MODERATE STAPHYLOCOCCUS AUREUS Note: RIFAMPIN AND GENTAMICIN SHOULD NOT BE USED AS SINGLE DRUGS FOR TREATMENT OF STAPH INFECTIONS.   CULT NO ANAEROBES ISOLATED 05/27/2008   No results for input(s): CULT, SDES in the last 168 hours.  MEDICATIONS: Infusion[s]: Infusions:  . sodium chloride 50 mL/hr at 06/05/15 2358  . Heparin- Restart at 800 units/hr   Antibiotic[s]: Anti-infectives    Start     Dose/Rate Route Frequency Ordered Stop   06/06/15 2200  vancomycin (VANCOCIN) IVPB 1000 mg/200 mL premix  Status:  Discontinued     1,000 mg 200 mL/hr over 60 Minutes Intravenous Every 24 hours 06/06/15 0507 06/06/15 1153   06/06/15 2200  vancomycin (VANCOCIN) IVPB 750 mg/150 ml premix     750 mg 150 mL/hr over 60 Minutes Intravenous Every 24 hours 06/06/15 1153     06/05/15 1700  vancomycin (VANCOCIN) IVPB 1000 mg/200 mL premix     1,000 mg 200 mL/hr over 60 Minutes Intravenous  Once 06/05/15 1653 06/06/15 0146      ASSESSMENT:  69 y.o. female s/p fem-pop bypass now admitted with L-foot ischemia and cellulitis.  She is currently on Heparin and Vancomycin with a planned angiogram Monday.  Heparin rate 1300 units/hr, HL high, > 1.  No evidence of bleeding complications noted   CrCl 32.4, Scr 1.44, she is currently afebrile.  GOAL:    Heparin Level  0.3 - 0.7 units/ml  Vancomycin trough level 10-15 mcg/ml  PLAN: 1. Vancomycin 750 mg IV q 24 hours. 2. Hold Heparin x 1 hour, then 3. Restart Heparin at 800 units/hr 4. Next HL in 8 hours [2130] 5. Daily Heparin Levels, Platelet counts, CBC. Monitor for bleeding complications   Velda Shell,  Pharm.D   06/06/2015,  11:58 AM

## 2015-06-06 NOTE — Progress Notes (Signed)
PHARMACY NOTE - Follow Up Consult  Pharmacy Consult  :  Heparin Indication  :  Limb ischemia  Heparin Dosing Weight: 64.4 kg   Recent Labs  06/05/15 2237 06/06/15 0455 06/06/15 0928 06/06/15 2122  HGB 10.8* 10.4*  --   --   HCT 33.9* 32.8*  --   --   PLT 142* 235  --   --   LABPROT 13.5  --   --   --   INR 1.01  --   --   --   HEPARINUNFRC  --   --  1.04* 0.49  CREATININE 1.44*  --   --   --    Infusions:  . sodium chloride 50 mL/hr at 06/06/15 2037  . heparin 800 Units/hr (06/06/15 1810)   Assessment:  69 y.o. female s/p fem-pop bypass now admitted with L-foot ischemia and currently on Heparin bridging pending scheduled CT angio on Monday.  Heparin rate 800 units/hr, Heparin level 0.49 units/ml > within therapeutic range.  No evidence of bleeding complications noted   Goal:  Heparin level 0.3-0.7 units/ml   Plan: 1. Continue Heparin at 800 units/hr. 2. Daily Heparin Levels, Platelet counts, CBC.  Monitor for bleeding complications    Laurena Bering,  Pharm.D  06/06/2015, 10:18 PM

## 2015-06-07 LAB — CBC
HCT: 37.6 % (ref 36.0–46.0)
HEMOGLOBIN: 11.9 g/dL — AB (ref 12.0–15.0)
MCH: 28.7 pg (ref 26.0–34.0)
MCHC: 31.6 g/dL (ref 30.0–36.0)
MCV: 90.6 fL (ref 78.0–100.0)
PLATELETS: 255 10*3/uL (ref 150–400)
RBC: 4.15 MIL/uL (ref 3.87–5.11)
RDW: 14.6 % (ref 11.5–15.5)
WBC: 5.5 10*3/uL (ref 4.0–10.5)

## 2015-06-07 LAB — GLUCOSE, CAPILLARY
GLUCOSE-CAPILLARY: 126 mg/dL — AB (ref 65–99)
GLUCOSE-CAPILLARY: 141 mg/dL — AB (ref 65–99)
GLUCOSE-CAPILLARY: 184 mg/dL — AB (ref 65–99)
Glucose-Capillary: 148 mg/dL — ABNORMAL HIGH (ref 65–99)
Glucose-Capillary: 143 mg/dL — ABNORMAL HIGH (ref 65–99)

## 2015-06-07 LAB — HEPARIN LEVEL (UNFRACTIONATED): HEPARIN UNFRACTIONATED: 0.49 [IU]/mL (ref 0.30–0.70)

## 2015-06-07 NOTE — Progress Notes (Signed)
Pt BP this AM 167/62.  Pt requests not to take PRN BP med at this time and wants to wait until her scheduled morning BP meds are administered.  Pt resting comfortably with call bell within reach.  Will cont to monitor pt closely.  Erenest Rasher, RN

## 2015-06-07 NOTE — Progress Notes (Addendum)
  [facility] Progress Note  Subjective  - Says left leg feels more swollen today. Asking if she should stay in bed or walk around.   Objective Filed Vitals:   06/07/15 0650 06/07/15 0659  BP: 167/62 166/49  Pulse:    Temp:    Resp:      Intake/Output Summary (Last 24 hours) at 06/07/15 0814 Last data filed at 06/07/15 0804  Gross per 24 hour  Intake 1041.28 ml  Output   3600 ml  Net -2558.72 ml   Left foot with ulcerations between toes.  Dopplerable left PT.   Assessment/Planning: 69 y.o. 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 Continue heparin.  Plan for arteriogram tomorrow. NPO past 4 am as per patient request.   [name] 06/07/2015 8:14 AM -- Agree with above.  [name], MD [facility] [place] Office: [phone] Pager: [phone]  Laboratory CBC    Component Value Date/Time   WBC 5.5 06/07/2015 0243   HGB 11.9* 06/07/2015 0243   HCT 37.6 06/07/2015 0243   PLT 255 06/07/2015 0243    BMET    Component Value Date/Time   NA 136 06/05/2015 2237   K 4.2 06/05/2015 2237   CL 101 06/05/2015 2237   CO2 27 06/05/2015 2237   GLUCOSE 221* 06/05/2015 2237   BUN 28* 06/05/2015 2237   CREATININE 1.44* 06/05/2015 2237   CALCIUM 9.3 06/05/2015 2237   GFRNONAA 36* 06/05/2015 2237   GFRAA 42* 06/05/2015 2237    COAG Lab Results  Component Value Date   INR 1.01 06/05/2015   INR 0.94 05/14/2014   INR 1.18 03/26/2012   No results found for: PTT  Antibiotics Anti-infectives    Start     Dose/Rate Route Frequency Ordered Stop   06/06/15 2200  vancomycin (VANCOCIN) IVPB 1000 mg/200 mL premix  Status:  Discontinued     1,000 mg 200 mL/hr over 60 Minutes Intravenous Every 24 hours 06/06/15 0507 06/06/15 1153   06/06/15 2200  vancomycin (VANCOCIN) IVPB 750 mg/150 ml premix     750 mg 150 mL/hr over 60 Minutes Intravenous  Every 24 hours 06/06/15 1153     06/05/15 1700  vancomycin (VANCOCIN) IVPB 1000 mg/200 mL premix     1,000 mg 200 mL/hr over 60 Minutes Intravenous  Once 06/05/15 1653 06/06/15 0146       [name], PA-C [facility] Office: [phone] Pager: [phone] 06/07/2015 8:14 AM

## 2015-06-07 NOTE — Progress Notes (Signed)
Pt refused to sign inform consent. Pt said she wants the doctor that will be doing the procedure to come explain what he's going to be doing and the risks involve before she sign. Consent form placed in pt chart. Reported off to coming RN. Dionne Bucy RN

## 2015-06-07 NOTE — Progress Notes (Signed)
PHARMACY NOTE - Follow Up Consult  Pharmacy Consult  :  Heparin Indication  :  Limb ischemia  Heparin Dosing Weight: 64.4 kg   Recent Labs  06/05/15 2237 06/06/15 0455 06/06/15 0928 06/06/15 2122 06/07/15 0243  HGB 10.8* 10.4*  --   --  11.9*  HCT 33.9* 32.8*  --   --  37.6  PLT 142* 235  --   --  255  LABPROT 13.5  --   --   --   --   INR 1.01  --   --   --   --   HEPARINUNFRC  --   --  1.04* 0.49 0.49  CREATININE 1.44*  --   --   --   --   } Infusions:  . sodium chloride 50 mL/hr at 06/06/15 2037  . heparin 800 Units/hr (06/06/15 1810)   Assessment:  69 y.o. female s/p fem-pop bypass admitted with L-foot ischemia and is continuing on Heparin bridging pending scheduled CT angio on Monday.  Heparin rate 800 units/hr, Heparin level 0.49 units/ml > within therapeutic range.  No evidence of bleeding complications noted  Goal:  Heparin level 0.3-0.7 units/ml   Plan: 1. Continue Heparin at 800 units/hr. 2. Daily Heparin Levels, Platelet counts, CBC.  Monitor for bleeding complications    Laurena Bering,  Pharm.D  06/07/2015, 10:17 AM

## 2015-06-08 ENCOUNTER — Other Ambulatory Visit: Payer: Self-pay | Admitting: *Deleted

## 2015-06-08 ENCOUNTER — Encounter (HOSPITAL_COMMUNITY)
Admission: AD | Disposition: A | Discharge status: Home / Self Care | Payer: Self-pay | Source: Ambulatory Visit | Attending: Vascular Surgery

## 2015-06-08 DIAGNOSIS — I70245 Atherosclerosis of native arteries of left leg with ulceration of other part of foot: Secondary | ICD-10-CM

## 2015-06-08 HISTORY — PX: PERIPHERAL VASCULAR CATHETERIZATION: SHX172C

## 2015-06-08 LAB — CBC
HEMATOCRIT: 33.9 % — AB (ref 36.0–46.0)
Hemoglobin: 10.8 g/dL — ABNORMAL LOW (ref 12.0–15.0)
MCH: 28.8 pg (ref 26.0–34.0)
MCHC: 31.9 g/dL (ref 30.0–36.0)
MCV: 90.4 fL (ref 78.0–100.0)
PLATELETS: 236 10*3/uL (ref 150–400)
RBC: 3.75 MIL/uL — AB (ref 3.87–5.11)
RDW: 14.7 % (ref 11.5–15.5)
WBC: 5.1 10*3/uL (ref 4.0–10.5)

## 2015-06-08 LAB — BASIC METABOLIC PANEL
ANION GAP: 8 (ref 5–15)
BUN: 17 mg/dL (ref 6–20)
CALCIUM: 9.1 mg/dL (ref 8.9–10.3)
CHLORIDE: 104 mmol/L (ref 101–111)
CO2: 27 mmol/L (ref 22–32)
Creatinine, Ser: 1.08 mg/dL — ABNORMAL HIGH (ref 0.44–1.00)
GFR calc non Af Amer: 51 mL/min — ABNORMAL LOW (ref >60)
GFR, EST AFRICAN AMERICAN: 59 mL/min — AB (ref >60)
GLUCOSE: 174 mg/dL — AB (ref 65–99)
POTASSIUM: 3.6 mmol/L (ref 3.5–5.1)
Sodium: 139 mmol/L (ref 135–145)

## 2015-06-08 LAB — GLUCOSE, CAPILLARY
Glucose-Capillary: 157 mg/dL — ABNORMAL HIGH (ref 65–99)
Glucose-Capillary: 108 mg/dL — ABNORMAL HIGH (ref 65–99)

## 2015-06-08 LAB — HEPARIN LEVEL (UNFRACTIONATED): Heparin Unfractionated: 0.38 IU/mL (ref 0.30–0.70)

## 2015-06-08 LAB — POCT ACTIVATED CLOTTING TIME: ACTIVATED CLOTTING TIME: 152 s

## 2015-06-08 SURGERY — ABDOMINAL AORTOGRAM W/LOWER EXTREMITY
Anesthesia: LOCAL

## 2015-06-08 MED ORDER — POTASSIUM CHLORIDE CRYS ER 20 MEQ PO TBCR: 20.0000 meq | EXTENDED_RELEASE_TABLET | Freq: Every day | ORAL | Status: DC | PRN

## 2015-06-08 MED ORDER — ONDANSETRON HCL 4 MG/2ML IJ SOLN: 4.0000 mg | Freq: Four times a day (QID) | INTRAMUSCULAR | Status: DC | PRN

## 2015-06-08 MED ORDER — IODIXANOL 320 MG/ML IV SOLN
INTRAVENOUS | Status: DC | PRN
  Administered 2015-06-08: 232 mL via INTRA_ARTERIAL

## 2015-06-08 MED ORDER — HEPARIN (PORCINE) IN NACL 2-0.9 UNIT/ML-% IJ SOLN
INTRAMUSCULAR | Status: AC
  Filled 2015-06-08: qty 1000

## 2015-06-08 MED ORDER — MIDAZOLAM HCL 2 MG/2ML IJ SOLN
INTRAMUSCULAR | Status: DC | PRN
  Administered 2015-06-08: 1 mg via INTRAVENOUS

## 2015-06-08 MED ORDER — OXYCODONE HCL 5 MG PO TABS: 5.0000 mg | ORAL_TABLET | Freq: Four times a day (QID) | ORAL | Status: DC | PRN

## 2015-06-08 MED ORDER — FENTANYL CITRATE (PF) 100 MCG/2ML IJ SOLN
INTRAMUSCULAR | Status: AC
  Filled 2015-06-08: qty 2

## 2015-06-08 MED ORDER — LABETALOL HCL 5 MG/ML IV SOLN
INTRAVENOUS | Status: DC | PRN
  Administered 2015-06-08 (×2): 10 mg via INTRAVENOUS

## 2015-06-08 MED ORDER — LIDOCAINE HCL (PF) 1 % IJ SOLN
INTRAMUSCULAR | Status: DC | PRN
  Administered 2015-06-08: 12:00:00

## 2015-06-08 MED ORDER — FENTANYL CITRATE (PF) 100 MCG/2ML IJ SOLN
INTRAMUSCULAR | Status: DC | PRN
  Administered 2015-06-08: 25 ug via INTRAVENOUS

## 2015-06-08 MED ORDER — CEPHALEXIN 500 MG PO CAPS: 500.0000 mg | ORAL_CAPSULE | Freq: Four times a day (QID) | ORAL | Status: DC

## 2015-06-08 MED ORDER — LABETALOL HCL 5 MG/ML IV SOLN: 10.0000 mg | INTRAVENOUS | Status: DC | PRN

## 2015-06-08 MED ORDER — MEPERIDINE HCL 50 MG PO TABS: 100.0000 mg | ORAL_TABLET | Freq: Every day | ORAL | Status: DC | PRN

## 2015-06-08 MED ORDER — VANCOMYCIN HCL IN DEXTROSE 1-5 GM/200ML-% IV SOLN
1000.0000 mg | INTRAVENOUS | Status: DC
  Filled 2015-06-08: qty 200

## 2015-06-08 MED ORDER — LABETALOL HCL 5 MG/ML IV SOLN
INTRAVENOUS | Status: AC
  Filled 2015-06-08: qty 4

## 2015-06-08 MED ORDER — LIDOCAINE HCL (PF) 1 % IJ SOLN
INTRAMUSCULAR | Status: AC
  Filled 2015-06-08: qty 30

## 2015-06-08 MED ORDER — MIDAZOLAM HCL 2 MG/2ML IJ SOLN
INTRAMUSCULAR | Status: AC
  Filled 2015-06-08: qty 2

## 2015-06-08 MED ORDER — HYDRALAZINE HCL 20 MG/ML IJ SOLN: 5.0000 mg | INTRAMUSCULAR | Status: DC | PRN

## 2015-06-08 MED ORDER — SODIUM CHLORIDE 0.45 % IV SOLN: INTRAVENOUS | Status: DC

## 2015-06-08 MED ORDER — DOCUSATE SODIUM 100 MG PO CAPS: 100.0000 mg | ORAL_CAPSULE | Freq: Every day | ORAL | Status: DC

## 2015-06-08 MED ORDER — METOPROLOL TARTRATE 1 MG/ML IV SOLN: 2.0000 mg | INTRAVENOUS | Status: DC | PRN

## 2015-06-08 SURGICAL SUPPLY — 11 items
CATH ANGIO 5F PIGTAIL 65CM (CATHETERS) ×1 IMPLANT
COVER PRB 48X5XTLSCP FOLD TPE (BAG) IMPLANT
COVER PROBE 5X48 (BAG) ×2
KIT PV (KITS) ×2 IMPLANT
SHEATH PINNACLE 5F 10CM (SHEATH) ×1 IMPLANT
SHEATH PINNACLE 6F 10CM (SHEATH) ×1 IMPLANT
SYR MEDRAD MARK V 150ML (SYRINGE) ×2 IMPLANT
TRANSDUCER W/STOPCOCK (MISCELLANEOUS) ×2 IMPLANT
TRAY PV CATH (CUSTOM PROCEDURE TRAY) ×2 IMPLANT
WIRE HITORQ VERSACORE ST 145CM (WIRE) ×1 IMPLANT
WIRE ROSEN-J .035X180CM (WIRE) ×1 IMPLANT

## 2015-06-08 NOTE — Interval H&P Note (Signed)
History and Physical Interval Note:  06/08/2015 10:53 AM  [name]  has presented today for surgery, with the diagnosis of Lt foot pain  The various methods of treatment have been discussed with the patient and family. After consideration of risks, benefits and other options for treatment, the patient has consented to  Procedure(s): Abdominal Aortogram w/Lower Extremity (N/A) as a surgical intervention .  The patient's history has been reviewed, patient examined, no change in status, stable for surgery.  I have reviewed the patient's chart and labs.  Questions were answered to the patient's satisfaction.     [name]

## 2015-06-08 NOTE — H&P (Signed)
[facility] [place]  History and Physical Update  See Progress Note from Clinic on 06/05/15.  Pt was sent from clinic for admission   Leonides Sake, MD [facility] [place] Office: [phone] Pager: [phone]  06/08/2015, 7:34 AM

## 2015-06-08 NOTE — Consult Note (Signed)
   [facility] CM Inpatient Consult   06/08/2015  Kaylana Fenstermacher [birth year] [id number] Patient had been referred to [facility] for High Risk follow up.  Notified by [facility] that the patient had admitted.  Met with the patient at bedside.  Patient was lying flat due to post procedure.  Explained services and benefits of [facility] for post hospital follow up.  Patient verbalized not needing any follow up at this time.  Patient did except a brochure with contact information.  Patient did not consent to services.  She denies any need for transportation, she lives with her spouse.  She states she has a follow up appointment with Dr. Donnetta Hutching in a couple of weeks.  Express to the patient and encouraged a post hospital follow up with her primary MD within the next 7 - 10 days.  Patient endorses Dr. Reynold Bowen as her primary care provider and she states she has an appointment already set up for within the next 2-3 months and feels she does not need to change it to earlier.  Will sign off at this time.  Inpatient care manager was made aware of the patient on [facility] [facility] [facility]'s follow up roster.  For questions, please contact: Natividad Brood, RN BSN [facility] Liaison  [phone] business mobile phone

## 2015-06-08 NOTE — Progress Notes (Signed)
CONSULT NOTE - FOLLOW UP  Pharmacy Consult for heparin, vancomycin Indication: limb ischemia, cellulitis   Allergies  Allergen Reactions  . Acetaminophen Nausea Only and Other (See Comments)    Does not tolerate well, nausea  . Ceftriaxone Itching  . Codeine Nausea And Vomiting  . Erythromycin Nausea And Vomiting  . Hydrocodone-Acetaminophen Nausea And Vomiting  . Hydromorphone Nausea And Vomiting  . Ibuprofen Other (See Comments)    Does not tolerate well  . Propoxyphene Hcl Nausea And Vomiting  . Statins Other (See Comments)    Leg myalgias  . Eggs Or Egg-Derived Products Other (See Comments)    "pt does not eat"  . Shellfish-Derived Products Other (See Comments)    "pt does not eat"    Vital Signs: Temp: 98.7 F (37.1 C) (12/05 0503) Temp Source: Oral (12/05 0503) BP: 158/45 mmHg (12/05 0503) Pulse Rate: 80 (12/05 0503) Intake/Output from previous day: 12/04 0701 - 12/05 0700 In: 3308.6 [P.O.:960; I.V.:2048.6; IV Piggyback:300] Out: 675 [Urine:675] Intake/Output from this shift: Total I/O In: 10 [I.V.:10] Out: -   Labs:  Recent Labs  06/05/15 2237 06/06/15 0455 06/07/15 0243 06/08/15 0304  WBC 4.3 4.6 5.5 5.1  HGB 10.8* 10.4* 11.9* 10.8*  PLT 142* 235 255 236  CREATININE 1.44*  --   --  1.08*   Estimated Creatinine Clearance: 43.2 mL/min (by C-G formula based on Cr of 1.08). No results for input(s): VANCOTROUGH, VANCOPEAK, VANCORANDOM, GENTTROUGH, GENTPEAK, GENTRANDOM, TOBRATROUGH, TOBRAPEAK, TOBRARND, AMIKACINPEAK, AMIKACINTROU, AMIKACIN in the last 72 hours.   Microbiology: Recent Results (from the past 720 hour(s))  Culture, blood (routine x 2)     Status: None (Preliminary result)   Collection Time: 06/05/15 11:15 PM  Result Value Ref Range Status   Specimen Description BLOOD PORTA CATH  Final   Special Requests   Final    BOTTLES DRAWN AEROBIC AND ANAEROBIC 10 ML PROXIMAL PORT   Culture NO GROWTH 2 DAYS  Final   Report Status PENDING   Incomplete  Culture, blood (routine x 2)     Status: None (Preliminary result)   Collection Time: 06/05/15 11:18 PM  Result Value Ref Range Status   Specimen Description BLOOD PORTA CATH  Final   Special Requests   Final    BOTTLES DRAWN AEROBIC AND ANAEROBIC MEDIAL PORT   Culture NO GROWTH 2 DAYS  Final   Report Status PENDING  Incomplete    Anti-infectives    Start     Dose/Rate Route Frequency Ordered Stop   06/06/15 2200  vancomycin (VANCOCIN) IVPB 1000 mg/200 mL premix  Status:  Discontinued     1,000 mg 200 mL/hr over 60 Minutes Intravenous Every 24 hours 06/06/15 0507 06/06/15 1153   06/06/15 2200  vancomycin (VANCOCIN) IVPB 750 mg/150 ml premix     750 mg 150 mL/hr over 60 Minutes Intravenous Every 24 hours 06/06/15 1153     06/05/15 1700  vancomycin (VANCOCIN) IVPB 1000 mg/200 mL premix     1,000 mg 200 mL/hr over 60 Minutes Intravenous  Once 06/05/15 1653 06/06/15 0146      Assessment: 69 y.o. Female with recent left leg bypass and here with suspected left foot ischemia and cellulitis. She is on heparin (heparin level is at goal) and vancomycin. Plans for angiogram today -SCr= 1.08 (down from 1.44)  Vanc 12/2 >>   Blood 12/2 >> ngtd  Goal of Therapy:  Vancomycin trough level 10-15 mcg/ml  Heparin level= 0.3-0.7 Platelet monitoring per protocol: yes  Plan:  -No heparin changes needed -Will increase vancomycin to  IV q24hr -Will follow plans post angiogram -Daily Heparin level and CBC  Harland German, Pharm D 06/08/2015 10:09 AM

## 2015-06-08 NOTE — H&P (View-Only) (Signed)
  [facility] Progress Note  Subjective  - Says left leg feels more swollen today. Asking if she should stay in bed or walk around.   Objective Filed Vitals:   06/07/15 0650 06/07/15 0659  BP: 167/62 166/49  Pulse:    Temp:    Resp:      Intake/Output Summary (Last 24 hours) at 06/07/15 0814 Last data filed at 06/07/15 0804  Gross per 24 hour  Intake 1041.28 ml  Output   3600 ml  Net -2558.72 ml   Left foot with ulcerations between toes.  Dopplerable left PT.   Assessment/Planning: 69 y.o. 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 Continue heparin.  Plan for arteriogram tomorrow. NPO past 4 am as per patient request.   Romey Mathieson A Malachy Coleman 06/07/2015 8:14 AM -- Agree with above.  Charles Fields, MD [facility] [place] Office: [phone] Pager: [phone]  Laboratory CBC    Component Value Date/Time   WBC 5.5 06/07/2015 0243   HGB 11.9* 06/07/2015 0243   HCT 37.6 06/07/2015 0243   PLT 255 06/07/2015 0243    BMET    Component Value Date/Time   NA 136 06/05/2015 2237   K 4.2 06/05/2015 2237   CL 101 06/05/2015 2237   CO2 27 06/05/2015 2237   GLUCOSE 221* 06/05/2015 2237   BUN 28* 06/05/2015 2237   CREATININE 1.44* 06/05/2015 2237   CALCIUM 9.3 06/05/2015 2237   GFRNONAA 36* 06/05/2015 2237   GFRAA 42* 06/05/2015 2237    COAG Lab Results  Component Value Date   INR 1.01 06/05/2015   INR 0.94 05/14/2014   INR 1.18 03/26/2012   No results found for: PTT  Antibiotics Anti-infectives    Start     Dose/Rate Route Frequency Ordered Stop   06/06/15 2200  vancomycin (VANCOCIN) IVPB 1000 mg/200 mL premix  Status:  Discontinued     1,000 mg 200 mL/hr over 60 Minutes Intravenous Every 24 hours 06/06/15 0507 06/06/15 1153   06/06/15 2200  vancomycin (VANCOCIN) IVPB 750 mg/150 ml premix     750 mg 150 mL/hr over 60 Minutes Intravenous  Every 24 hours 06/06/15 1153     06/05/15 1700  vancomycin (VANCOCIN) IVPB 1000 mg/200 mL premix     1,000 mg 200 mL/hr over 60 Minutes Intravenous  Once 06/05/15 1653 06/06/15 0146       Mariaha Ellington, PA-C [facility] Office: [phone] Pager: [phone] 06/07/2015 8:14 AM      

## 2015-06-08 NOTE — Op Note (Signed)
Procedure: Abdominal aortogram with bilateral lower extremity runoff  Preoperative diagnosis: Nonhealing wound left foot. Postoperative diagnosis: Same  Anesthesia: Local with IV sedation  Indications: Patient is a 69 year old female who has previously undergone multiple vascular procedures including aortobifemoral bypass and bilateral femoral popliteal bypass and right below-knee amputation. She recently presented with new onset of ulcerations on the left foot. Duplex scan preprocedure showed high-grade stenosis below the left leg bypass.  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 The patient was placed in supine position the Angio table. Both groins were prepped and draped in usual sterile fashion. Ultrasound was used to identify the right common femoral artery. Local anesthesia was inserted over this. Using ultrasound guidance the right common femoral artery was successfully cannulated and an 035 Rosen wire threaded up the abdominal aorta under fluoroscopic guidance. A 6 French dilator was placed over the guidewire to dilate the scar tract. A 5 French sheath was then placed over the guidewire and a 5 French pigtail catheter was placed over the guidewire and advanced up in the abdominal aorta after thoroughly flushing the sheath heparinized saline. Abdominal aortogram was obtained in AP projection. The left and right renal arteries are patent. The aortobifemoral bypass graft begins in the infrarenal portion of the aorta and is widely patent. The native common external and internal iliac arteries are all occluded. Next the pigtail catheter was pulled down just above the aortic bifurcation and oblique views of the pelvis were obtained. There is no  significant narrowing at the distal anastomoses of the aortobifemoral although the distal anastomoses were slightly patulous with swirling of contrast within them. Next lower extremity runoff views were obtained through the pigtail catheter.  In the left lower extremity, the left common femoral artery is patent. The left profunda femoris artery is patent. There is a bypass graft originating from the area of the distal anastomosis of the aortobifem with his proximal anastomosis at the common femoral artery in the distal anastomosis and the above-knee popliteal artery. This is widely patent. The popliteal artery totally occludes at the level of the knee joint. There is reconstitution of a diseased below-knee popliteal artery with one-vessel runoff via a patent but diseased peroneal artery to the left foot. The anterior tibial and posterior tibial arteries are occluded.  At this point the pigtail catheter was removed over a guidewire. The 5 French sheath was thoroughly flushed with heparinized saline. The patient tolerated procedure well and there were no complications. The patient was taken to the holding area in stable condition.  Operative management: The patient's films will be reviewed by Dr. Arbie Cookey. He'll make a decision regarding whether or not the patient needs interposition jump graft to the below-knee popliteal artery are continued observation for now.  [name], MD [facility] [place] Office: [phone] Pager: [phone]

## 2015-06-08 NOTE — Care Management Important Message (Signed)
Important Message  Patient Details  Name: [name] MRN: [id number] Date of Birth: 09/15/1945   Medicare Important Message Given:  Yes    Kyla Balzarine 06/08/2015, 9:23 AM

## 2015-06-08 NOTE — Progress Notes (Signed)
Patient ID: [name], female   DOB: 1946/04/15, 69 y.o.   MRN: [id number] Patient is comfortable following her arteriogram. Right groin shows no evidence of false aneurysm or hematoma. I reviewed her films her recent noninvasive study. I discussed these in great detail with the patient's husband and the patient.  On physical exam, she does have an area of full-thickness ulceration which is approximately 3 mm on the medial aspect of her second toe. There is a smaller area that may be partial thickness on the lateral aspect of her third toe. 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 There is no lesion on her heel were otherwise on her foot and she denies any pain in her left foot.  Her arteriogram reveals widely patent aortobifemoral bypass. She has a widely patent left femoral to above-knee popliteal bypass. In her prior study she had significant disease in her popliteal artery. This is progressed to occlusion over 2 different segments of her popliteal artery. Runoff is via a reconstituted peroneal artery which has moderate to severe disease throughout its course. Anterior tibial and posterior tibial are occluded just past the origin.  Her foot itself is quite pink and warm. She does have good posterior tibial Doppler flow and more dampened peroneal Doppler flow. I do not hear dorsalis pedis flow in her foot  I discussed options with the patient and her husband. Clearly she does not have any evidence of acute change. Her main presenting issue was swelling in this is improved with several days of bedrest and anabiotic. Feel that it is safe for her to be discharged home on oral antibodies. I again discussed the critical importance of elevation with her foot prior than her heart. I have suggested that she begin hydrogel to the area on her second and third toe daily. If she has any evidence of progressive tissue loss or begins having pain in her  foot which would notify us. Otherwise I will see her again in 2 weeks for continued discussion. She could have a above-knee popliteal bypass to peroneal artery bypass. Hopefully can avoid this with conservative treatment due to her disadvantage peroneal artery outflow and also with her significant cardiac comorbidities. We will make this decision depending on progression of her tissue loss

## 2015-06-08 NOTE — Progress Notes (Signed)
Sheath pull note: 60fr arterial sheath removed from right femoral artery. Manual pressure held for 15 minutes to achieve hemostasis. VSS throughout sheath pull. Left PT pulse by doppler. Patient given post sheath pull instructions and verbalized understanding. Report called to Sherry Ruffing nurse. Bedrest to start at 1225. Glennie Hawk, RN 12:29 PM

## 2015-06-08 NOTE — Patient Outreach (Signed)
[facility]) Care Management  06/08/2015  [name] 12-Mar-1946 [id number]   [employer] Tier 4 referral sent to hospital liaison [name] since patient is currently inpatient for vascular surgery.   RMCM asked liaison to follow for telephonic RNCM and notify RNCM of outcome.  [name] H. Gardiner Barefoot, BSN, CCM [facility] Care Management [facility] Telephonic CM Phone: [phone] Fax: [phone]

## 2015-06-09 ENCOUNTER — Other Ambulatory Visit: Payer: Self-pay | Admitting: *Deleted

## 2015-06-09 ENCOUNTER — Encounter: Payer: Self-pay | Admitting: *Deleted

## 2015-06-09 ENCOUNTER — Encounter (HOSPITAL_COMMUNITY): Payer: Self-pay | Admitting: Vascular Surgery

## 2015-06-09 NOTE — Patient Outreach (Signed)
[facility]) Care Management  06/09/2015  [name] 1946/02/15 [id number]   Objective: Received in basket message from hospital liaison, patient has refused [facility] Care Management services.    Assessment: [employer] Tier 4 list referral.  Patient currently inpatient.  Plan: RNCM will notify primary MD ([name]) of case closure due to patient's refusal of services.  RNCM will notify [name] [facility] Care Management Assistant to close patient's case due the refusal of services.  [name], BSN, CCM [facility] Care Management [facility] Telephonic CM Phone: [phone] Fax: [phone]

## 2015-06-09 NOTE — Discharge Summary (Signed)
Discharge Summary    [name] Dec 12, 1945 69 y.o. female  [id number]  Admission Date: 06/05/2015  Discharge Date: 06/08/15  Physician: No att. providers found  Admission Diagnosis: left leg cellulitis Lt foot pain   HPI:   This is a 69 y.o. female who presents for evaluation of Left foot edema with cellulitis and open wounds between her toes. These symptoms started 1 week ago and has gotten progressively worse. She was seen by her primary care physician today and he sent her over for evaluation.  Pt has frank cellulitis in the setting of prior ABF and L fem-pop bypass. There may be compromise of the outflow from the L fem-pop BPG that is resulting the ischemia in the left foot, so intervention may be needed. Given presence of PT signal in L leg, she does not have not have a frankly threatened limb.   Hospital Course:  The patient was admitted to the hospital and started on IV heparin and Abx.   Pt a difficult stick and did have a central line placed.    On 06/08/2015 she was taken to the [facility] lab and underwent: abdominal aortogram with bilateral lower extremity runoff.   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  The pt tolerated the procedure well and was transported to the PACU in good condition.   Dr. Donnetta [name] reviewed the films.  Her arteriogram reveals widely patent aortobifemoral bypass. She has a widely patent left femoral to above-knee popliteal bypass. In her prior study she had significant disease in her popliteal artery. This is progressed to occlusion over 2 different segments of her popliteal artery. Runoff is via a reconstituted peroneal artery which has moderate to severe disease throughout its course. Anterior tibial and posterior tibial are occluded just past the  origin.  Her foot itself is quite pink and warm. She does have good posterior tibial Doppler flow and more dampened peroneal Doppler flow. I do not hear dorsalis pedis flow in her foot.  He discussed the findings with the pt and her husband.  Clearly she does not have any evidence of acute change. Her main presenting issue was swelling in this is improved with several days of bedrest and anabiotic. Feel that it is safe for her to be discharged home on oral antibodies. I again discussed the critical importance of elevation with her foot prior than her heart. I have suggested that she begin hydrogel to the area on her second and third toe daily. If she has any evidence of progressive tissue loss or begins having pain in her foot which would notify us. Otherwise I will see her again in 2 weeks for continued discussion. She could have a above-knee popliteal bypass to peroneal artery bypass. Hopefully can avoid this with conservative treatment due to her disadvantage peroneal artery outflow and also with her significant cardiac comorbidities. We will make this decision depending on progression of her tissue loss.   CBC    Component Value Date/Time   WBC 5.1 06/08/2015 0304   RBC 3.75* 06/08/2015 0304   HGB 10.8* 06/08/2015 0304   HCT 33.9* 06/08/2015 0304   PLT 236 06/08/2015 0304   MCV 90.4 06/08/2015 0304   MCH 28.8 06/08/2015 0304   MCHC 31.9 06/08/2015 0304   RDW 14.7 06/08/2015 0304   LYMPHSABS 1.9 08/18/2014 2110   MONOABS  0.5 08/18/2014 2110   EOSABS 0.1 08/18/2014 2110   BASOSABS 0.0 08/18/2014 2110    BMET    Component Value Date/Time   NA 139 06/08/2015 0304   K 3.6 06/08/2015 0304   CL 104 06/08/2015 0304   CO2 27 06/08/2015 0304   GLUCOSE 174* 06/08/2015 0304   BUN 17 06/08/2015 0304   CREATININE 1.08* 06/08/2015 0304   CALCIUM 9.1 06/08/2015 0304   GFRNONAA 51* 06/08/2015 0304   GFRAA 63* 06/08/2015 0304      Discharge Instructions    Call MD for:  redness,  tenderness, or signs of infection (pain, swelling, bleeding, redness, odor or green/yellow discharge around incision site)    Complete by:  As directed      Call MD for:  severe or increased pain, loss or decreased feeling  in affected limb(s)    Complete by:  As directed      Call MD for:  temperature >100.5    Complete by:  As directed      Discharge wound care:    Complete by:  As directed   Hydrogel to 2nd and 3rd left toes daily.     Discontinue Central Line    Complete by:  As directed      Driving Restrictions    Complete by:  As directed   No driving for 2 weeks     Lifting restrictions    Complete by:  As directed   No lifting for 4 weeks     Resume previous diet    Complete by:  As directed            Discharge Diagnosis:  left leg cellulitis Lt foot pain  Secondary Diagnosis: Patient Active Problem List   Diagnosis Date Noted  . Cellulitis and abscess of foot 06/05/2015  . Encounter for central line placement 08/20/2014  . UTI (lower urinary tract infection) 08/19/2014  . Aftercare following surgery of the circulatory system, [place] 01/22/2013  . PVD (peripheral vascular disease) ([place]) 08/21/2012  . Fall from slipping 07/10/2012  . Non-healing surgical wound 06/14/2012  . S/P BKA (below knee amputation) ([place]) 04/27/2012  . Acute myocardial infarction, subendocardial infarction, initial episode of care ([place]) 04/01/2012  . Acute combined systolic and diastolic heart failure ([place]) 03/30/2012  . Pulmonary edema, acute ([place]) 03/29/2012  . Respiratory failure, post-operative ([place]) 03/26/2012  . Pain in limb 03/20/2012  . Atherosclerotic PVD with intermittent claudication ([place]) 02/21/2012  . Atherosclerosis of native arteries of the extremities with ulceration ([place]) 11/11/2011  . Chronic ulcer of unspecified site 11/11/2011  . Cellulitis 11/11/2011  . Atherosclerosis of native arteries of extremity with intermittent claudication ([place]) 09/06/2011  . CORONARY  ATHEROSCLEROSIS NATIVE CORONARY ARTERY 03/12/2009  . 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 Diabetes mellitus type 2 with neurological manifestations ([place]) 10/11/2008  . Hyperlipemia 10/11/2008  . HYPERTENSION, UNSPECIFIED 10/11/2008  . UNSPECIFIED PERIPHERAL VASCULAR DISEASE 10/11/2008   Past Medical History  Diagnosis Date  . Hypertension     Unspecified  . Hyperlipidemia     Mixed  . Tobacco abuse     Remote  . Loose, teeth     has loose bridge and two loose teeth holding it  . PONV (postoperative nausea and vomiting)   . Type II diabetes mellitus ([place])   . GERD (gastroesophageal reflux disease)   . Anxiety   . Peripheral vascular disease (HCC)     unspecified, a. s/p L CEA b. s/p B fem-pop bypass  .  Raynaud disease   . Stones in the urinary tract   . UTI (urinary tract infection)   . Anemia     hx  . Coronary artery disease     a. s/p NSTEMI 02/2009 - PCI LCX with Xience DES. Otherwise branch vessel and Dist. RCA dzs. NL EF.   Marland Kitchen Myocardial infarction ([place])   . CHF (congestive heart failure) ([place])   . Kidney stones   . UTI (lower urinary tract infection)   . Constipation   . Hernia, umbilical   . History of blood transfusion   . Fall from slipping on wet surface Jan. 7, 2014    Right stump bleeding from fall  . Carotid artery occlusion        Medication List    TAKE these medications        ACIDOPHILUS PEARLS Caps  Take 1 capsule by mouth daily.     ALPRAZolam 0.25 MG tablet  Commonly known as:  XANAX  Take 1 tablet (0.25 mg total) by mouth 3 (three) times daily as needed for anxiety.     cephALEXin 500 MG capsule  Commonly known as:  KEFLEX  Take 1 capsule (500 mg total) by mouth 4 (four) times daily.     DEXILANT 60 MG capsule  Generic drug:  dexlansoprazole  Take 60 mg by mouth 2 (two) times daily.     furosemide 40 MG tablet  Commonly known as:  LASIX  Take 1 tablet (40 mg total) by mouth daily.     iron polysaccharides 150 MG capsule   Commonly known as:  NIFEREX  Take 450-600 mg by mouth 2 (two) times daily. TAKES 3 CAPS IN AM AND 4 CAPS IN PM     isosorbide mononitrate 60 MG 24 hr tablet  Commonly known as:  IMDUR  Take 1 tablet (60 mg total) by mouth daily.     meperidine 50 MG tablet  Commonly known as:  DEMEROL  Take 2 tablets (100 mg total) by mouth daily as needed for severe pain. For pain     metoprolol succinate 50 MG 24 hr tablet  Commonly known as:  TOPROL-XL  Take 50 mg by mouth 2 (two) times daily. Take with or immediately following a meal.     nitroGLYCERIN 0.4 MG SL tablet  Commonly known as:  NITROSTAT  Place 1 tablet (0.4 mg total) under the tongue every 5 (five) minutes as needed. For chest pain     omega-3 acid ethyl esters 1 G capsule  Commonly known as:  LOVAZA  Take 2 g by mouth 2 (two) times daily.     pioglitazone-metformin 15-850 MG tablet  Commonly known as:  ACTOPLUS MET  Take 1 tablet by mouth daily. Brand name only-does not work if components are given separately per patient 15-850 MG     potassium chloride 10 MEQ tablet  Commonly known as:  K-DUR  Take 1 tablet (10 mEq total) by mouth 2 (two) times daily.     prochlorperazine 10 MG tablet  Commonly known as:  COMPAZINE  Take 1 tablet (10 mg total) by mouth every 6 (six) hours as needed. For nausea     ramipril 1.25 MG capsule  Commonly known as:  ALTACE  Take 1 capsule (1.25 mg total) by mouth 2 (two) times daily.     rosuvastatin 10 MG tablet  Commonly known as:  CRESTOR  Take 1 tablet (10 mg total) by mouth every 7 (seven) days. Takes on Monday's  sitaGLIPtin 100 MG tablet  Commonly known as:  JANUVIA  Take 100 mg by mouth at bedtime.     Vitamin D (Ergocalciferol) 50000 UNITS Caps capsule  Commonly known as:  DRISDOL  Take 1 capsule (50,000 Units total) by mouth every 7 (seven) days.     ZETIA 10 MG tablet  Generic drug:  ezetimibe  TAKE 1 TABLET (10 MG TOTAL) BY MOUTH DAILY.        Prescriptions  given: Meperidine  #20 No Refill  Instructions: 1.  Hyrdogel to left 2nd and 3rd toes daily  Disposition: home  Patient's condition: is Good  Follow up: 1. Dr. Donnetta [name] in 2 weeks   Leontine Locket, PA-C [facility] [phone] 06/09/2015  11:15 AM

## 2015-06-10 ENCOUNTER — Telehealth: Payer: Self-pay | Admitting: Vascular Surgery

## 2015-06-10 LAB — CULTURE, BLOOD (ROUTINE X 2)
Culture: NO GROWTH
Culture: NO GROWTH

## 2015-06-10 NOTE — Telephone Encounter (Signed)
-----   Message from Sharee Pimple, RN sent at 06/08/2015  3:24 PM EST ----- Regarding: schedule   ----- Message -----    From: Dara Lords, PA-C    Sent: 06/08/2015   2:30 PM      To: [facility] Charge Pool  Dr. Arbie Cookey wants to see this pt back in 2 weeks to evaluate pt and wounds.   Thanks, Lelon Mast

## 2015-06-10 NOTE — Telephone Encounter (Signed)
Patient called to make appt, dpm

## 2015-06-17 ENCOUNTER — Encounter: Payer: Self-pay | Admitting: Vascular Surgery

## 2015-06-23 ENCOUNTER — Encounter: Payer: Self-pay | Admitting: Vascular Surgery

## 2015-06-23 ENCOUNTER — Ambulatory Visit (INDEPENDENT_AMBULATORY_CARE_PROVIDER_SITE_OTHER): Payer: Medicare Other | Admitting: Vascular Surgery

## 2015-06-23 VITALS — BP 131/49 | HR 16 | Temp 97.7°F | Resp 81 | Ht 64.0 in | Wt 156.0 lb

## 2015-06-23 DIAGNOSIS — I999 Unspecified disorder of circulatory system: Secondary | ICD-10-CM | POA: Diagnosis not present

## 2015-06-23 DIAGNOSIS — M79672 Pain in left foot: Secondary | ICD-10-CM | POA: Diagnosis not present

## 2015-06-23 DIAGNOSIS — G8918 Other acute postprocedural pain: Secondary | ICD-10-CM | POA: Diagnosis not present

## 2015-06-23 DIAGNOSIS — I6523 Occlusion and stenosis of bilateral carotid arteries: Secondary | ICD-10-CM

## 2015-06-23 DIAGNOSIS — I998 Other disorder of circulatory system: Secondary | ICD-10-CM

## 2015-06-23 MED ORDER — MEPERIDINE HCL 50 MG PO TABS: 50.0000 mg | ORAL_TABLET | ORAL | Status: DC | PRN

## 2015-06-23 NOTE — Progress Notes (Signed)
[facility]  Patient name: [name] MRN: [id number] DOB: Apr 09, 1946 Sex: female  REASON FOR VISIT:  Follow-up of ulceration left second toe.  HPI: [name] is a 69 y.o. female  Seen today for continued follow-up of her complex peripheral vascular disease. She did undergo arteriography on 06/08/2015. This revealed patency of her left femoral-popliteal bypass. She did have occlusion of her native popliteal artery below the bypass with single-vessel runoff via a very diseased peroneal artery. She has ulceration over the medial aspect of her second toe. This did not show any evidence of infection. She has severe small vessel disease. She has had very difficult time healing her toe ulceration for nearly a year and a half. She also has ongoing issue regarding ulceration over the tip of a failure on her right hand and apparently is being evaluated by specialist at [place] possible sympathectomy. She is also to potentially have Botox injection around that time as well.  Past Medical History  Diagnosis Date  . Hypertension     Unspecified  . Hyperlipidemia     Mixed  . Tobacco abuse     Remote  . Loose, teeth     has loose bridge and two loose teeth holding it  . PONV (postoperative nausea and vomiting)   . Type II diabetes mellitus ([place])   . GERD (gastroesophageal reflux disease)   . Anxiety   . Peripheral vascular disease (HCC)     unspecified, a. s/p L CEA b. s/p B fem-pop bypass  . Raynaud disease   . Stones in the urinary tract   . UTI (urinary tract infection)   . Anemia     hx  . Coronary artery disease     a. s/p NSTEMI 02/2009 - PCI LCX with Xience DES. Otherwise branch vessel and Dist. RCA dzs. NL EF.   Marland Kitchen Myocardial infarction ([place])   . CHF (congestive heart failure) ([place])   . Kidney stones   . UTI (lower urinary tract infection)   . Constipation   . Hernia, umbilical   . History of blood transfusion   . Fall from slipping  on wet surface Jan. 7, 2014    Right stump bleeding from fall  . Carotid artery occlusion     Family History  Problem Relation Age of Onset  . Heart attack Mother   . Heart attack Father   . Hypertension Brother   . Diabetes Brother   . Hypertension Brother     SOCIAL HISTORY: Social History  Substance Use Topics  . Smoking status: Former Smoker -- 2.00 packs/day for 39 years    Types: Cigarettes    Quit date: 07/05/2001  . Smokeless tobacco: Never Used  . Alcohol Use: 8.4 oz/week    14 Standard drinks or equivalent per week    Allergies  Allergen Reactions  . Acetaminophen Nausea Only and Other (See Comments)    Does not tolerate well, nausea  . Ceftriaxone Itching  . Codeine Nausea And Vomiting  . Erythromycin Nausea And Vomiting  . Hydrocodone-Acetaminophen Nausea And Vomiting  . Hydromorphone Nausea And Vomiting  . Ibuprofen Other (See Comments)    Does not tolerate well  . Propoxyphene Hcl Nausea And Vomiting  . Statins Other (See Comments)    Leg myalgias  . Eggs Or Egg-Derived Products Other (See Comments)    "pt does not eat"  . Shellfish-Derived Products Other (See Comments)    "pt does not eat"  Current Outpatient Prescriptions  Medication Sig Dispense Refill  . ALPRAZolam (XANAX) 0.25 MG tablet Take 1 tablet (0.25 mg total) by mouth 3 (three) times daily as needed for anxiety. (Patient taking differently: Take 0.25 mg by mouth 2 (two) times daily as needed. ) 60 tablet 0  . dexlansoprazole (DEXILANT) 60 MG capsule Take 60 mg by mouth 2 (two) times daily.    . furosemide (LASIX) 40 MG tablet Take 1 tablet (40 mg total) by mouth daily. 30 tablet 1  . iron polysaccharides (NIFEREX) 150 MG capsule Take 450-600 mg by mouth 2 (two) times daily. TAKES 3 CAPS IN AM AND 4 CAPS IN PM    . isosorbide mononitrate (IMDUR) 60 MG 24 hr tablet Take 1 tablet (60 mg total) by mouth daily. (Patient taking differently: Take 30 mg by mouth 2 (two) times daily. ) 30 tablet 1   . meperidine (DEMEROL) 50 MG tablet Take 1 tablet (50 mg total) by mouth every 4 (four) hours as needed for severe pain. For pain 50 tablet 0  . metoprolol succinate (TOPROL-XL) 50 MG 24 hr tablet Take 50 mg by mouth 2 (two) times daily. Take with or immediately following a meal.    . nitroGLYCERIN (NITROSTAT) 0.4 MG SL tablet Place 1 tablet (0.4 mg total) under the tongue every 5 (five) minutes as needed. For chest pain 25 tablet 2  . omega-3 acid ethyl esters (LOVAZA) 1 G capsule Take 2 g by mouth 2 (two) times daily.     . pioglitazone-metformin (ACTOPLUS MET) 15-850 MG per tablet Take 1 tablet by mouth daily. Brand name only-does not work if components are given separately per patient 15-850 MG    . potassium chloride (K-DUR) 10 MEQ tablet Take 1 tablet (10 mEq total) by mouth 2 (two) times daily. 60 tablet 1  . Probiotic Product (ACIDOPHILUS PEARLS) CAPS Take 1 capsule by mouth daily.    . prochlorperazine (COMPAZINE) 10 MG tablet Take 1 tablet (10 mg total) by mouth every 6 (six) hours as needed. For nausea 30 tablet 2  . ramipril (ALTACE) 1.25 MG capsule Take 1 capsule (1.25 mg total) by mouth 2 (two) times daily. (Patient taking differently: Take 1.25 mg by mouth daily. ) 60 capsule 1  . rosuvastatin (CRESTOR) 10 MG tablet Take 1 tablet (10 mg total) by mouth every 7 (seven) days. Takes on Monday's (Patient taking differently: Take 10 mg by mouth every Monday. Takes on Monday's) 30 tablet 3  . sitaGLIPtan (JANUVIA) 100 MG tablet Take 100 mg by mouth at bedtime.     . Vitamin D, Ergocalciferol, (DRISDOL) 50000 UNITS CAPS Take 1 capsule (50,000 Units total) by mouth every 7 (seven) days. (Patient taking differently: Take 50,000 Units by mouth every Monday, Wednesday, and Friday. ) 30 capsule 1  . ZETIA 10 MG tablet TAKE 1 TABLET (10 MG TOTAL) BY MOUTH DAILY. 90 tablet 0  . cephALEXin (KEFLEX) 500 MG capsule Take 1 capsule (500 mg total) by mouth 4 (four) times daily. (Patient not taking:  Reported on 06/23/2015) 42 capsule 0   No current facility-administered medications for this visit.  PHYSICAL EXAM: Filed Vitals:   06/23/15 1341  BP: 131/49  Pulse: 16  Temp: 97.7 F (36.5 C)  TempSrc: Oral  Resp: 81  Height: 5' 4"  (1.626 m)  Weight: 156 lb (70.761 kg)  SpO2: 99%    GENERAL: The patient is a well-nourished female, in no acute distress. The vital signs are documented above. VASCULAR:  Easily palpable popliteal pulse. No pedal pulses. Significant lower extremity edema PULMONARY: There is good air exchange bilaterally without wheezing or rales. ABDOMEN: Soft and non-tender with normal pitched bowel sounds.  MUSCULOSKELETAL: There are no major deformities or cyanosis. NEUROLOGIC: No focal weakness or paresthesias are detected. SKIN:  Ulceration approximately three-quarter centimeter on the medial aspect of her second toe. PSYCHIATRIC: The patient has a normal affect.    MEDICAL ISSUES:  again discussed options with patient. She is not having any progression of her ulceration. I explained that the only revascularization option would be jump from her popliteal artery to peroneal artery. Peroneal artery has extensive disease throughout its course. Also minimal vein left for all type autologous conduit do it which would be required. She reports that the specialist that that this has talked about Botox in her feet as well. I do not see any harm in attempting this. I will see her again in 3 weeks for continued  Follow-up. She did request refill of Demerol. Was given Demerol 50 #50 no refill No Follow-up on file.   Curt Jews [facility]: [phone]

## 2015-06-30 ENCOUNTER — Encounter (HOSPITAL_BASED_OUTPATIENT_CLINIC_OR_DEPARTMENT_OTHER): Payer: Medicare Other | Attending: General Surgery

## 2015-06-30 DIAGNOSIS — E11622 Type 2 diabetes mellitus with other skin ulcer: Secondary | ICD-10-CM | POA: Diagnosis present

## 2015-06-30 DIAGNOSIS — L98491 Non-pressure chronic ulcer of skin of other sites limited to breakdown of skin: Secondary | ICD-10-CM | POA: Insufficient documentation

## 2015-07-02 ENCOUNTER — Other Ambulatory Visit: Payer: Self-pay | Admitting: Physician Assistant

## 2015-07-07 ENCOUNTER — Encounter (HOSPITAL_COMMUNITY): Payer: Medicare Other

## 2015-07-07 ENCOUNTER — Ambulatory Visit: Payer: Medicare Other | Admitting: Vascular Surgery

## 2015-07-10 ENCOUNTER — Encounter: Payer: Self-pay | Admitting: Vascular Surgery

## 2015-07-10 ENCOUNTER — Ambulatory Visit (INDEPENDENT_AMBULATORY_CARE_PROVIDER_SITE_OTHER): Payer: Medicare Other | Admitting: Cardiovascular Disease

## 2015-07-10 ENCOUNTER — Encounter: Payer: Self-pay | Admitting: Cardiovascular Disease

## 2015-07-10 VITALS — BP 154/76 | HR 89 | Ht 64.0 in | Wt 157.0 lb

## 2015-07-10 DIAGNOSIS — I2510 Atherosclerotic heart disease of native coronary artery without angina pectoris: Secondary | ICD-10-CM

## 2015-07-10 MED ORDER — ASPIRIN EC 81 MG PO TBEC: 81.0000 mg | DELAYED_RELEASE_TABLET | Freq: Every day | ORAL | Status: AC

## 2015-07-10 NOTE — Progress Notes (Signed)
Cardiology Office Note Date:  07/10/2015   ID:  [name], DOB 1946-01-30, MRN [id number]  PCP:  Sheela Stack, MD  Cardiologist:  Sherren Mocha, MD    Chief Complaint  Patient presents with  . Coronary Artery Disease   History of Present Illness: [name] is a 70 y.o. female who presents for follow-up evaluation. the patient is followed for coronary artery disease. She also has extensive vascular disease. She has undergone left femoral popliteal bypass grafting in 2009, aortobifemoral bypass grafting in 2013, and right below-knee amputation in 2013. She had a left carotid endarterectomy in 2005. From a coronary perspective, she has undergone stenting of the left circumflex at the time of a non-ST elevation MI. Her most recent heart catheterization in 2013 demonstrated continued patency of the left circumflex with mild nonobstructive disease in the LAD and normal LV function.  The patient continues to have a lot of problems related to her vascular disease. She recently had surgery on her right hand at [facility] She is followed closely by Dr. Donnetta Hutching with an appointment next week evaluating chronic ischemia in the left leg.  From a cardiac perspective, she is doing okay. She denies chest pain, chest pressure, or shortness of breath. She does have chronic left leg swelling. She's had no orthopnea or PND. She has been off of both aspirin and Plavix over the past year after an episode of bleeding requiring a blood transfusion.  Past Medical History  Diagnosis Date  . Hypertension     Unspecified  . Hyperlipidemia     Mixed  . Tobacco abuse     Remote  . Loose, teeth     has loose bridge and two loose teeth holding it  . PONV (postoperative nausea and vomiting)   . Type II diabetes mellitus ([place])   . GERD (gastroesophageal reflux disease)   . Anxiety   . Peripheral vascular disease (HCC)     unspecified, a. s/p L CEA b. s/p B fem-pop bypass  . Raynaud  disease   . Stones in the urinary tract   . UTI (urinary tract infection)   . Anemia     hx  . Coronary artery disease     a. s/p NSTEMI 02/2009 - PCI LCX with Xience DES. Otherwise branch vessel and Dist. RCA dzs. NL EF.   Marland Kitchen Myocardial infarction ([place])   . CHF (congestive heart failure) ([place])   . Kidney stones   . UTI (lower urinary tract infection)   . Constipation   . Hernia, umbilical   . History of blood transfusion   . Fall from slipping on wet surface Jan. 7, 2014    Right stump bleeding from fall  . Carotid artery occlusion     Past Surgical History  Procedure Laterality Date  . Vesicovaginal fistula closure w/ tah    . Colonoscopy  11/2010  . Upper gastrointestinal endoscopy  11/2010  . Femoral artery - popliteal artery bypass graft      left  . Abdominal hysterectomy      complete  . Femoral-popliteal bypass graft  08/22/2011    Procedure: BYPASS GRAFT FEMORAL-POPLITEAL ARTERY;  Surgeon: Curt Jews, MD;  Location: [place];  Service: Vascular;  Laterality: Right;  Thrombectomy and Revision using 42m x 10cm stretch goretex graft  . Intraoperative arteriogram  08/22/2011    Procedure: INTRA OPERATIVE ARTERIOGRAM;  Surgeon: TCurt Jews MD;  [facility];  Service: Vascular;  Laterality: Right;  to lower leg  .  Multiple tooth extractions  08-29-2011    5 teeth extracted   . Pci  01/17/12    RLE  . Breast lumpectomy      right  . Tonsillectomy and adenoidectomy    . Coronary angioplasty with stent placement    . Carotid endarterectomy  ~ 2005    left  . Aorta - bilateral femoral artery bypass graft  03/26/2012    Procedure: AORTA BIFEMORAL BYPASS GRAFT;  Surgeon: Rosetta Posner, MD;  [facility];  Service: Vascular;  Laterality: N/A;  Aortic-bifemoral bypass using 14x58m Hemashield graft .   . Umbilical hernia repair  03/26/2012    Procedure: HERNIA REPAIR UMBILICAL ADULT;  Surgeon: TRosetta Posner MD;  [facility];  Service: Vascular;  Laterality: N/A;  Removal of  Umbilical hernia sac  . Femoral artery exploration  03/26/2012    Procedure: FEMORAL ARTERY EXPLORATION;  Surgeon: TRosetta Posner MD;  [facility];  Service: Vascular;  Laterality: Right;  with Revision of Popliteal-Peroneal bypass graft using 663mx 10cm thin wall goretex graft  . Amputation  04/23/2012    Procedure: AMPUTATION BELOW KNEE;  Surgeon: ToRosetta PosnerMD;  [facility];  Laterality: Right;  . Eye surgery Right Feb. 9, 2015    Cataract  . Eye surgery Left Feb. 16, 2015    Cataract  . Abdominal aortagram N/A 05/17/2011    Procedure: ABDOMINAL AORTAGRAM;  Surgeon: VaSerafina MitchellMD;  [facility];  Service: Cardiovascular;  Laterality: N/A;  . Lower extremity angiogram  05/17/2011    Procedure: LOWER EXTREMITY ANGIOGRAM;  Surgeon: VaSerafina MitchellMD;  [facility];  Service: Cardiovascular;;  . Percutaneous stent intervention Right 05/17/2011    Procedure: PERCUTANEOUS STENT INTERVENTION;  Surgeon: VaSerafina MitchellMD;  [facility];  Service: Cardiovascular;  Laterality: Right;  . Lower extremity angiogram N/A 09/21/2011    Procedure: LOWER EXTREMITY ANGIOGRAM;  Surgeon: VaSerafina MitchellMD;  [facility];  Service: Cardiovascular;  Laterality: N/A;  . Abdominal angiogram  09/21/2011    Procedure: ABDOMINAL ANGIOGRAM;  Surgeon: VaSerafina MitchellMD;  [facility];  Service: Cardiovascular;;  . Abdominal aortagram N/A 01/17/2012    Procedure: ABDOMINAL AOMaxcine Ham Surgeon: VaSerafina MitchellMD;  [facility];  Service: Cardiovascular;  Laterality: N/A;  . Left heart catheterization with coronary angiogram N/A 04/05/2012    Procedure: LEFT HEART CATHETERIZATION WITH CORONARY ANGIOGRAM;  Surgeon: MiSherren MochaMD;  [facility];  Service: Cardiovascular;  Laterality: N/A;  . Peripheral vascular catheterization N/A 06/08/2015    Procedure: Abdominal Aortogram w/Lower Extremity;  Surgeon: ChElam DutchMD;  [facility];  Service: Cardiovascular;  Laterality: N/A;    Current Outpatient Prescriptions  Medication Sig Dispense Refill  . ALPRAZolam (XANAX) 0.25 MG tablet Take 0.25 mg by mouth 2 (two) times daily.    . cephALEXin (KEFLEX) 500 MG capsule Take 1 capsule (500 mg total) by mouth 4 (four) times daily. 42 capsule 0  . collagenase (SANTYL) ointment Apply 1 application topically daily.    . Marland Kitchenexlansoprazole (DEXILANT) 60 MG capsule Take 60 mg by mouth 2 (two) times daily.    . ergocalciferol (VITAMIN D2) 50000 units capsule Take 50,000 Units by mouth 3 (three) times a week.    . furosemide (LASIX) 40 MG tablet Take 1 tablet (40 mg total) by mouth daily. 30 tablet  1  . Insulin Degludec (TRESIBA FLEXTOUCH) 100 UNIT/ML SOPN Inject 16 Units into the skin daily.    . iron polysaccharides (NIFEREX) 150 MG capsule Take three (3) capsules (450 mg total) by mouth every morning and take four (4) capsules (600 mg total) by mouth every evening.    . isosorbide mononitrate (IMDUR) 30 MG 24 hr tablet Take 30 mg by mouth daily.    . meperidine (DEMEROL) 50 MG tablet Take 1 tablet (50 mg total) by mouth every 4 (four) hours as needed for severe pain. For pain 50 tablet 0  . metoprolol succinate (TOPROL-XL) 50 MG 24 hr tablet Take 50 mg by mouth 2 (two) times daily. Take with or immediately following a meal.    . nitroGLYCERIN (NITROSTAT) 0.4 MG SL tablet Place 1 tablet (0.4 mg total) under the tongue every 5 (five) minutes as needed. For chest pain 25 tablet 2  . omega-3 acid ethyl esters (LOVAZA) 1 G capsule Take 2 g by mouth 2 (two) times daily.     . pioglitazone-metformin (ACTOPLUS MET) 15-850 MG per tablet Take 1 tablet by mouth daily. Brand name only-does not work if components are given separately per patient 15-850 MG    . potassium chloride (K-DUR) 10 MEQ tablet Take 1 tablet (10 mEq total) by mouth 2 (two) times daily. 60 tablet 1  . Probiotic Product (ACIDOPHILUS PEARLS)  CAPS Take 1 capsule by mouth daily.    . prochlorperazine (COMPAZINE) 10 MG tablet Take 1 tablet (10 mg total) by mouth every 6 (six) hours as needed. For nausea 30 tablet 2  . ramipril (ALTACE) 1.25 MG capsule Take 1.25 mg by mouth daily. BRAND NAME ONLY    . rosuvastatin (CRESTOR) 10 MG tablet Take 10 mg by mouth once a week.    . sitaGLIPtan (JANUVIA) 100 MG tablet Take 100 mg by mouth at bedtime.     . sodium chloride (MURO 128) 5 % ophthalmic solution Place 1 drop into both eyes daily as needed. (lubrication)    . sulfamethoxazole-trimethoprim (BACTRIM DS,SEPTRA DS) 800-160 MG tablet Take 1 tablet by mouth daily.    Marland Kitchen ZETIA 10 MG tablet TAKE 1 TABLET (10 MG TOTAL) BY MOUTH DAILY. 90 tablet 0  . aspirin EC 81 MG tablet Take 1 tablet (81 mg total) by mouth daily.     No current facility-administered medications for this visit.    Allergies:   Acetaminophen; Ceftriaxone; Codeine; Erythromycin; Hydrocodone-acetaminophen; Hydromorphone; Ibuprofen; Propoxyphene hcl; Statins; Eggs or egg-derived products; and Shellfish-derived products   Social History:  The patient  reports that she quit smoking about 14 years ago. Her smoking use included Cigarettes. She has a 78 pack-year smoking history. She has never used smokeless tobacco. She reports that she drinks about 8.4 oz of alcohol per week. She reports that she does not use illicit drugs.   Family History:  The patient's family history includes Diabetes in her brother; Heart attack in her father and mother; Hypertension in her brother and brother.    ROS:  Please see the history of present illness.  Otherwise, review of systems is positive for leg swelling, visual disturbance, leg pain, snoring.  All other systems are reviewed and negative.    PHYSICAL EXAM: VS:  BP 154/76 mmHg  Pulse 89  Ht 5' 4"  (1.626 m)  Wt 157 lb (71.215 kg)  BMI 26.94 kg/m2 , BMI Body mass index is 26.94 kg/(m^2). GEN: Well nourished, well developed, in no acute  distress HEENT: normal  Neck: no JVD, no masses. bilateral carotid bruits Cardiac: RRR without murmur or gallop                Respiratory:  clear to auscultation bilaterally, normal work of breathing GI: soft, nontender, nondistended, + BS MS: no deformity or atrophy Ext: 2+ left pretibial edema Skin: warm and dry, no rash Neuro:  Strength and sensation are intact Psych: euthymic mood, full affect  EKG:  EKG is ordered today. The ekg ordered today shows  Normal sinus rhythm 89 bpm, within normal limits.  Recent Labs: 06/05/2015: ALT 9* 06/08/2015: BUN 17; Creatinine, Ser 1.08*; Hemoglobin 10.8*; Platelets 236; Potassium 3.6; Sodium 139   Lipid Panel     Component Value Date/Time   CHOL 163 07/01/2014 0841   TRIG 148.0 07/01/2014 0841   HDL 57.70 07/01/2014 0841   CHOLHDL 3 07/01/2014 0841   VLDL 29.6 07/01/2014 0841   LDLCALC 76 07/01/2014 0841      Wt Readings from Last 3 Encounters:  07/10/15 157 lb (71.215 kg)  06/23/15 156 lb (70.761 kg)  06/05/15 142 lb (64.411 kg)     ASSESSMENT AND PLAN: 1.   CAD, native vessel, without symptoms of angina. The patient appears stable from a cardiac perspective. She did have a significant bleeding episode last year, but this occurred on dual antiplatelet therapy with aspirin and Plavix. I think the risk/benefit favors resuming aspirin 81 mg daily. She will watch for signs of bleeding. Considering her extensive cardiovascular disease she should be on antiplatelet therapy if at all possible.  2. Hyperlipidemia: She takes ezetimibe and Crestor with dose limitation from side effects. She is followed closely by Dr. Forde Dandy.  3. Essential hypertension: Treated with isosorbide , metoprolol succinate , and ramipril.   4. Severe peripheral arterial disease , followed closely by Dr. Donnetta Hutching with upcoming appointment next week.  Current medicines are reviewed with the patient today.  The patient does not have concerns regarding medicines.  Labs/  tests ordered today include:   Orders Placed This Encounter  Procedures  . EKG 12-Lead    Disposition:   FU one year  Signed, Sherren Mocha, MD  07/10/2015 7:14 PM    [facility] [address], [place], [place]  [address] Phone: [phone]; Fax: [phone]

## 2015-07-10 NOTE — Patient Instructions (Signed)
Medication Instructions:  Your physician has recommended you make the following change in your medication:  1. START Aspirin 81mg  take one by mouth daily  Labwork: No new orders.   Testing/Procedures: No new orders.   Follow-Up: Your physician wants you to follow-up in: 1 YEAR with Dr Excell Seltzer.  You will receive a reminder letter in the mail two months in advance. If you don't receive a letter, please call our office to schedule the follow-up appointment.   Any Other Special Instructions Will Be Listed Below (If Applicable).     If you need a refill on your cardiac medications before your next appointment, please call your pharmacy.

## 2015-07-14 ENCOUNTER — Ambulatory Visit (INDEPENDENT_AMBULATORY_CARE_PROVIDER_SITE_OTHER): Payer: Medicare Other | Admitting: Vascular Surgery

## 2015-07-14 ENCOUNTER — Other Ambulatory Visit: Payer: Self-pay

## 2015-07-14 ENCOUNTER — Encounter: Payer: Self-pay | Admitting: Vascular Surgery

## 2015-07-14 VITALS — BP 144/69 | HR 82 | Ht 64.0 in | Wt 157.0 lb

## 2015-07-14 DIAGNOSIS — I998 Other disorder of circulatory system: Secondary | ICD-10-CM

## 2015-07-14 DIAGNOSIS — G8918 Other acute postprocedural pain: Secondary | ICD-10-CM | POA: Diagnosis not present

## 2015-07-14 DIAGNOSIS — I999 Unspecified disorder of circulatory system: Secondary | ICD-10-CM

## 2015-07-14 DIAGNOSIS — I2510 Atherosclerotic heart disease of native coronary artery without angina pectoris: Secondary | ICD-10-CM

## 2015-07-14 DIAGNOSIS — M79672 Pain in left foot: Secondary | ICD-10-CM | POA: Diagnosis not present

## 2015-07-14 MED ORDER — MEPERIDINE HCL 50 MG PO TABS: 50.0000 mg | ORAL_TABLET | ORAL | Status: DC | PRN

## 2015-07-14 NOTE — Progress Notes (Signed)
[facility] [place]  Patient name: Roshanna Cimino MRN: [id number] DOB: May 01, 1946 Sex: female  REASON FOR VISIT:  Follow-up of ulcerations on left foot.  HPI: Od[place] Nishi is a 70 y.o. female  Here today for continued discussion regarding her ischemia in her left foot. She has multiple issues. She has had the recent surgery on her right hand with ischemic ulcerations on her fingers with what sounds like a sympathectomy at [facility]. Also had Botox injections the same setting and also had Botox injections on the dorsum of her left foot but hopefully improve flow into the digits. She reports a persistent pain and was requiring Demerol for any relief at night on a daily basis.  Past Medical History  Diagnosis Date  . Hypertension     Unspecified  . Hyperlipidemia     Mixed  . Tobacco abuse     Remote  . Loose, teeth     has loose bridge and two loose teeth holding it  . PONV (postoperative nausea and vomiting)   . Type II diabetes mellitus ([place])   . GERD (gastroesophageal reflux disease)   . Anxiety   . Peripheral vascular disease (HCC)     unspecified, a. s/p L CEA b. s/p B fem-pop bypass  . Raynaud disease   . Stones in the urinary tract   . UTI (urinary tract infection)   . Anemia     hx  . Coronary artery disease     a. s/p NSTEMI 02/2009 - PCI LCX with Xience DES. Otherwise branch vessel and Dist. RCA dzs. NL EF.   Marland Kitchen Myocardial infarction ([place])   . CHF (congestive heart failure) ([place])   . Kidney stones   . UTI (lower urinary tract infection)   . Constipation   . Hernia, umbilical   . History of blood transfusion   . Fall from slipping on wet surface Jan. 7, 2014    Right stump bleeding from fall  . Carotid artery occlusion     Family History  Problem Relation Age of Onset  . Heart attack Mother   . Heart attack Father   . Hypertension Brother   . Diabetes Brother   . Hypertension Brother     SOCIAL HISTORY: Social History    Substance Use Topics  . Smoking status: Former Smoker -- 2.00 packs/day for 39 years    Types: Cigarettes    Quit date: 07/05/2001  . Smokeless tobacco: Never Used  . Alcohol Use: 8.4 oz/week    14 Standard drinks or equivalent per week    Allergies  Allergen Reactions  . Acetaminophen Nausea Only and Other (See Comments)    Does not tolerate well, nausea  . Ceftriaxone Itching  . Codeine Nausea And Vomiting  . Erythromycin Nausea And Vomiting  . Hydrocodone-Acetaminophen Nausea And Vomiting  . Hydromorphone Nausea And Vomiting  . Ibuprofen Other (See Comments)    Does not tolerate well  . Propoxyphene Hcl Nausea And Vomiting  . Statins Other (See Comments)    Leg myalgias  . Eggs Or Egg-Derived Products Other (See Comments)    "pt does not eat"  . Shellfish-Derived Products Other (See Comments)    "pt does not eat"    Current Outpatient Prescriptions  Medication Sig Dispense Refill  . ALPRAZolam (XANAX) 0.25 MG tablet Take 0.25 mg by mouth 2 (two) times daily.    Marland Kitchen aspirin EC 81 MG tablet Take 1 tablet (81 mg total) by mouth daily.    Marland Kitchen  cephALEXin (KEFLEX) 500 MG capsule Take 1 capsule (500 mg total) by mouth 4 (four) times daily. 42 capsule 0  . collagenase (SANTYL) ointment Apply 1 application topically daily.    Marland Kitchen dexlansoprazole (DEXILANT) 60 MG capsule Take 60 mg by mouth 2 (two) times daily.    . ergocalciferol (VITAMIN D2) 50000 units capsule Take 50,000 Units by mouth 3 (three) times a week.    . furosemide (LASIX) 40 MG tablet Take 1 tablet (40 mg total) by mouth daily. 30 tablet 1  . Insulin Degludec (TRESIBA FLEXTOUCH) 100 UNIT/ML SOPN Inject 16 Units into the skin daily.    . iron polysaccharides (NIFEREX) 150 MG capsule Take three (3) capsules (450 mg total) by mouth every morning and take four (4) capsules (600 mg total) by mouth every evening.    . isosorbide mononitrate (IMDUR) 30 MG 24 hr tablet Take 30 mg by mouth daily.    . metoprolol succinate  (TOPROL-XL) 50 MG 24 hr tablet Take 50 mg by mouth 2 (two) times daily. Take with or immediately following a meal.    . nitroGLYCERIN (NITROSTAT) 0.4 MG SL tablet Place 1 tablet (0.4 mg total) under the tongue every 5 (five) minutes as needed. For chest pain 25 tablet 2  . omega-3 acid ethyl esters (LOVAZA) 1 G capsule Take 2 g by mouth 2 (two) times daily.     . pioglitazone-metformin (ACTOPLUS MET) 15-850 MG per tablet Take 1 tablet by mouth daily. Brand name only-does not work if components are given separately per patient 15-850 MG    . potassium chloride (K-DUR) 10 MEQ tablet Take 1 tablet (10 mEq total) by mouth 2 (two) times daily. 60 tablet 1  . Probiotic Product (ACIDOPHILUS PEARLS) CAPS Take 1 capsule by mouth daily.    . prochlorperazine (COMPAZINE) 10 MG tablet Take 1 tablet (10 mg total) by mouth every 6 (six) hours as needed. For nausea 30 tablet 2  . ramipril (ALTACE) 1.25 MG capsule Take 1.25 mg by mouth daily. BRAND NAME ONLY    . rosuvastatin (CRESTOR) 10 MG tablet Take 10 mg by mouth once a week.    . sitaGLIPtan (JANUVIA) 100 MG tablet Take 100 mg by mouth at bedtime.     . sodium chloride (MURO 128) 5 % ophthalmic solution Place 1 drop into both eyes daily as needed. (lubrication)    . sulfamethoxazole-trimethoprim (BACTRIM DS,SEPTRA DS) 800-160 MG tablet Take 1 tablet by mouth daily.    Marland Kitchen ZETIA 10 MG tablet TAKE 1 TABLET (10 MG TOTAL) BY MOUTH DAILY. 90 tablet 0  . meperidine (DEMEROL) 50 MG tablet Take 1 tablet (50 mg total) by mouth every 4 (four) hours as needed for severe pain. 50 tablet 0   No current facility-administered medications for this visit.    REVIEW OF SYSTEMS:  _0  denotes positive finding, _1  denotes negative finding Cardiac  Comments:  Chest pain or chest pressure:    Shortness of breath upon exertion:    Short of breath when lying flat:    Irregular heart rhythm:        Vascular    Pain in calf, thigh, or hip brought on by ambulation:    Pain in  feet at night that wakes you up from your sleep:     Blood clot in your veins:    Leg swelling:         Pulmonary    Oxygen at home:    Productive cough:  Wheezing:         Neurologic    Sudden weakness in arms or legs:     Sudden numbness in arms or legs:     Sudden onset of difficulty speaking or slurred speech:    Temporary loss of vision in one eye:     Problems with dizziness:         Gastrointestinal    Blood in stool:     Vomited blood:         Genitourinary    Burning when urinating:     Blood in urine:        Psychiatric    Major depression:         Hematologic    Bleeding problems:    Problems with blood clotting too easily:        Skin    Rashes or ulcers:        Constitutional    Fever or chills:      PHYSICAL EXAM: Filed Vitals:   07/14/15 1122  BP: 144/69  Pulse: 82  Height: _0  (1.626 m)  Weight: 157 lb (71.215 kg)    GENERAL: The patient is a well-nourished female, in no acute distress. The vital signs are documented above.  Sitting in a wheelchair with her right BK prosthesis in place VASCULAR:  No palpable pedal pulses. PULMONARY: There is good air exchange  MUSCULOSKELETAL: There are no major deformities or cyanosis. NEUROLOGIC: No focal weakness or paresthesias are detected. SKIN:  Some progression full-thickness ulceration her medial aspect of her left second toe lateral aspect of her third toe PSYCHIATRIC: The patient has a normal affect.  DATA:   no new vascular lab studies  MEDICAL ISSUES:  again had long discussion with patient. She had undergone arteriogram on 06/08/2015 showing patent left femoral-popliteal bypass but an interval occlusion of her below-knee popliteal. She has single-vessel runoff through a very diseased peroneal artery. I did image her remaining great saphenous vein in her calf and also her small saphenous vein with SonoSite ultrasound. Does appear that she has occlusion of her saphenous her great saphenous  vein in the proximal portion. It appears that her small saphenous vein is patent and the posterior calf. I did explain the option of popliteal to peroneal bypass with small saphenous vein. With her extensive small vessel disease into her toes I do not know whether this would make any impact. Artificial decision then to determine whether or not she had progression of the tissue loss which she certainly has had. I discussed the option of proceeding now versus delaying and continue to keep a wait-and-see attitude. She wishes to see whether the Botox has any impact. I have  No experience with this but have very little hope that this will make a difference. We will see her again in 3 weeks for ongoing discussion. She is again requested refill on the Demerol. Explained that I am not comfortable on a long-term basis but will refill this one more time for the next 3 weeks. She was given 50 mg for a total of 50  -1 every 4 when necessary  No Follow-up on file.   Curt Jews [facility] [place]: [phone]

## 2015-07-27 ENCOUNTER — Encounter: Payer: Self-pay | Admitting: Vascular Surgery

## 2015-07-28 ENCOUNTER — Encounter: Payer: Self-pay | Admitting: Vascular Surgery

## 2015-07-28 ENCOUNTER — Ambulatory Visit (INDEPENDENT_AMBULATORY_CARE_PROVIDER_SITE_OTHER): Payer: Medicare Other | Admitting: Vascular Surgery

## 2015-07-28 VITALS — BP 97/66 | HR 90 | Ht 64.0 in | Wt 157.0 lb

## 2015-07-28 DIAGNOSIS — M79672 Pain in left foot: Secondary | ICD-10-CM

## 2015-07-28 DIAGNOSIS — I999 Unspecified disorder of circulatory system: Secondary | ICD-10-CM

## 2015-07-28 DIAGNOSIS — I2510 Atherosclerotic heart disease of native coronary artery without angina pectoris: Secondary | ICD-10-CM | POA: Diagnosis not present

## 2015-07-28 NOTE — Progress Notes (Signed)
Patient name: [name] MRN: [id number] DOB: 04-May-1946 Sex: female  REASON FOR VISIT:  Discussion of left toe ulcerations  HPI: [name] is a 70 y.o. female  Here today for continued follow-up of ulcerations of her toes of her left foot. She is here today with her husband. He is having to make some very difficult financial decisions regarding selling of beach home versus home in [place] and also potentially moving into [employer]. I unfortunately had very little knowledge regarding the regulations around this. He has discussed this as well with Dr. Forde Dandy.  Current Outpatient Prescriptions  Medication Sig Dispense Refill  . ALPRAZolam (XANAX) 0.25 MG tablet Take 0.25 mg by mouth 2 (two) times daily.    Marland Kitchen amoxicillin (AMOXIL) 875 MG tablet Take 875 mg by mouth.    Marland Kitchen aspirin EC 81 MG tablet Take 1 tablet (81 mg total) by mouth daily.    . collagenase (SANTYL) ointment Apply 1 application topically daily.    Marland Kitchen dexlansoprazole (DEXILANT) 60 MG capsule Take 60 mg by mouth 2 (two) times daily.    . ergocalciferol (VITAMIN D2) 50000 units capsule Take 50,000 Units by mouth 3 (three) times a week.    . furosemide (LASIX) 40 MG tablet Take 1 tablet (40 mg total) by mouth daily. 30 tablet 1  . Insulin Degludec (TRESIBA FLEXTOUCH) 100 UNIT/ML SOPN Inject 16 Units into the skin daily.    . iron polysaccharides (NIFEREX) 150 MG capsule Take three (3) capsules (450 mg total) by mouth every morning and take four (4) capsules (600 mg total) by mouth every evening.    . isosorbide mononitrate (IMDUR) 30 MG 24 hr tablet Take 30 mg by mouth daily.    . meperidine (DEMEROL) 50 MG tablet Take 1 tablet (50 mg total) by mouth every 4 (four) hours as needed for severe pain. 50 tablet 0  . metoprolol succinate (TOPROL-XL) 50 MG 24 hr tablet Take 50 mg by mouth 2 (two) times daily. Take with or immediately following a meal.    . nitroGLYCERIN (NITROSTAT) 0.4 MG SL tablet Place 1 tablet  (0.4 mg total) under the tongue every 5 (five) minutes as needed. For chest pain 25 tablet 2  . omega-3 acid ethyl esters (LOVAZA) 1 G capsule Take 2 g by mouth 2 (two) times daily.     . pioglitazone-metformin (ACTOPLUS MET) 15-850 MG per tablet Take 1 tablet by mouth daily. Brand name only-does not work if components are given separately per patient 15-850 MG    . potassium chloride (K-DUR) 10 MEQ tablet Take 1 tablet (10 mEq total) by mouth 2 (two) times daily. 60 tablet 1  . Probiotic Product (ACIDOPHILUS PEARLS) CAPS Take 1 capsule by mouth daily.    . prochlorperazine (COMPAZINE) 10 MG tablet Take 1 tablet (10 mg total) by mouth every 6 (six) hours as needed. For nausea 30 tablet 2  . ramipril (ALTACE) 1.25 MG capsule Take 1.25 mg by mouth daily. BRAND NAME ONLY    . rosuvastatin (CRESTOR) 10 MG tablet Take 10 mg by mouth once a week.    . sitaGLIPtan (JANUVIA) 100 MG tablet Take 100 mg by mouth at bedtime.     . sodium chloride (MURO 128) 5 % ophthalmic solution Place 1 drop into both eyes daily as needed. (lubrication)    . sulfamethoxazole-trimethoprim (BACTRIM DS,SEPTRA DS) 800-160 MG tablet Take 1 tablet by mouth daily.    Marland Kitchen ZETIA 10 MG tablet TAKE 1 TABLET (10 MG TOTAL)  BY MOUTH DAILY. 90 tablet 0  . cephALEXin (KEFLEX) 500 MG capsule Take 1 capsule (500 mg total) by mouth 4 (four) times daily. (Patient not taking: Reported on 07/28/2015) 42 capsule 0   No current facility-administered medications for this visit.    REVIEW OF SYSTEMS:  _0  denotes positive finding, _1  denotes negative finding Cardiac  Comments:  Chest pain or chest pressure:    Shortness of breath upon exertion:    Short of breath when lying flat:    Irregular heart rhythm:    Constitutional    Fever or chills:      PHYSICAL EXAM: Filed Vitals:   07/28/15 1347 07/28/15 1350  BP: 107/67 97/66  Pulse: 90   Height: 5' 4" (1.626 m)   Weight: 157 lb (71.215 kg)   SpO2: 94%     GENERAL: The patient is a  well-nourished female, in no acute distress. The vital signs are documented above. PULMONARY: There is good air exchange   no real change in her left foot. She does have full-thickness ulceration over the medial aspect of her second toe and lateral aspect of her third toe also has some superficial blistering on the dorsum of the same areas.  MEDICAL ISSUES:  I do not feel that she has adequate flow to heal her toes. With her severe microvascular disease , she is at significant risk for limb loss even with redo bypass. 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 She had reconstitution of a diseased below-knee popliteal with single vessel runoff via diseased peroneal artery. I explained that she would have better flow with a above-knee to below-knee jump graft around this popliteal occlusion whether this would provide adequate flow for healing is unknown. She wishes to proceed with surgery will let us know when she wishes to do this. We will continue discussion regarding there options for rehabilitation and living arrangements. I will see her in one week for continued discussion and also for of vein mapping to determine what vein is available for jump graft  Nehemias Sauceda [facility] [employer]: [phone]

## 2015-07-28 NOTE — Addendum Note (Signed)
Addended by: Adria Dill L on: 07/28/2015 03:18 PM   Modules accepted: Orders

## 2015-08-03 ENCOUNTER — Telehealth: Payer: Self-pay | Admitting: Vascular Surgery

## 2015-08-03 NOTE — Telephone Encounter (Signed)
-----   Message from Rudean Haskell sent at 08/03/2015 11:50 AM EST ----- Regarding: RE: 01/31 Appointment ok ----- Message -----    From: Jena Gauss    Sent: 08/03/2015  11:38 AM      To: Aurelio Jew Reaves Subject: RE: 01/31 Appointment                          No, just the two tests scheduled.  ----- Message -----    From: Rudean Haskell    Sent: 08/03/2015  11:25 AM      To: Sallyanne Kuster Roczniak Subject: RE: 01/31 Appointment                          When we look at the orders in EPIC, there are also carotid, and abi's ordered, are these needed also?   ----- Message -----    From: Jena Gauss    Sent: 08/03/2015  11:15 AM      To: Aurelio Jew Reaves Subject: RE: 01/31 Appointment                          Well, she needs both, however, when I scheduled it there was no room in the lab to do it the same day and they were fine with splitting the appointments.  ----- Message -----    From: Rudean Haskell    Sent: 08/03/2015  11:08 AM      To: Jena Gauss Subject: 01/31 Appointment                              Is the LE Bypass study needed for tomorrow, or just the vein mapping? Before any changes were made I wanted to check with you incase it is insurance related???  [employer]

## 2015-08-04 ENCOUNTER — Ambulatory Visit (HOSPITAL_COMMUNITY)
Admission: RE | Admit: 2015-08-04 | Discharge: 2015-08-04 | Disposition: A | Discharge status: Home / Self Care | Payer: Medicare Other | Source: Ambulatory Visit | Attending: Vascular Surgery | Admitting: Vascular Surgery

## 2015-08-04 ENCOUNTER — Ambulatory Visit: Payer: Medicare Other | Admitting: Vascular Surgery

## 2015-08-04 DIAGNOSIS — E119 Type 2 diabetes mellitus without complications: Secondary | ICD-10-CM | POA: Diagnosis not present

## 2015-08-04 DIAGNOSIS — I10 Essential (primary) hypertension: Secondary | ICD-10-CM | POA: Insufficient documentation

## 2015-08-04 DIAGNOSIS — Z01818 Encounter for other preprocedural examination: Secondary | ICD-10-CM | POA: Insufficient documentation

## 2015-08-04 DIAGNOSIS — I999 Unspecified disorder of circulatory system: Secondary | ICD-10-CM | POA: Diagnosis not present

## 2015-08-04 DIAGNOSIS — M79672 Pain in left foot: Secondary | ICD-10-CM | POA: Insufficient documentation

## 2015-08-04 DIAGNOSIS — E785 Hyperlipidemia, unspecified: Secondary | ICD-10-CM | POA: Diagnosis not present

## 2015-08-04 DIAGNOSIS — I998 Other disorder of circulatory system: Secondary | ICD-10-CM

## 2015-08-06 ENCOUNTER — Inpatient Hospital Stay (INDEPENDENT_AMBULATORY_CARE_PROVIDER_SITE_OTHER)
Admission: RE | Admit: 2015-08-06 | Discharge: 2015-08-06 | Disposition: A | Discharge status: Home / Self Care | Payer: Medicare Other | Source: Ambulatory Visit

## 2015-08-06 DIAGNOSIS — I999 Unspecified disorder of circulatory system: Secondary | ICD-10-CM

## 2015-08-06 DIAGNOSIS — M79672 Pain in left foot: Secondary | ICD-10-CM | POA: Diagnosis not present

## 2015-08-06 DIAGNOSIS — I998 Other disorder of circulatory system: Secondary | ICD-10-CM

## 2015-08-12 ENCOUNTER — Other Ambulatory Visit: Payer: Self-pay | Admitting: Vascular Surgery

## 2015-08-12 DIAGNOSIS — I739 Peripheral vascular disease, unspecified: Secondary | ICD-10-CM

## 2015-08-13 ENCOUNTER — Other Ambulatory Visit: Payer: Self-pay

## 2015-08-17 ENCOUNTER — Other Ambulatory Visit (HOSPITAL_COMMUNITY): Payer: Medicare Other

## 2015-08-18 ENCOUNTER — Ambulatory Visit (HOSPITAL_COMMUNITY)
Admission: RE | Admit: 2015-08-18 | Discharge: 2015-08-18 | Disposition: A | Discharge status: Home / Self Care | Payer: Medicare Other | Source: Ambulatory Visit | Attending: Vascular Surgery | Admitting: Vascular Surgery

## 2015-08-18 ENCOUNTER — Encounter (HOSPITAL_COMMUNITY)
Admission: RE | Admit: 2015-08-18 | Discharge: 2015-08-18 | Disposition: A | Discharge status: Home / Self Care | Payer: Medicare Other | Source: Ambulatory Visit | Attending: Vascular Surgery | Admitting: Vascular Surgery

## 2015-08-18 ENCOUNTER — Encounter (HOSPITAL_COMMUNITY): Payer: Self-pay

## 2015-08-18 ENCOUNTER — Other Ambulatory Visit: Payer: Self-pay | Admitting: Vascular Surgery

## 2015-08-18 ENCOUNTER — Inpatient Hospital Stay (HOSPITAL_COMMUNITY): Admission: RE | Admit: 2015-08-18 | Payer: Medicare Other | Source: Ambulatory Visit

## 2015-08-18 DIAGNOSIS — E119 Type 2 diabetes mellitus without complications: Secondary | ICD-10-CM | POA: Insufficient documentation

## 2015-08-18 DIAGNOSIS — F419 Anxiety disorder, unspecified: Secondary | ICD-10-CM

## 2015-08-18 DIAGNOSIS — I739 Peripheral vascular disease, unspecified: Secondary | ICD-10-CM | POA: Insufficient documentation

## 2015-08-18 DIAGNOSIS — Z7982 Long term (current) use of aspirin: Secondary | ICD-10-CM

## 2015-08-18 DIAGNOSIS — Z89511 Acquired absence of right leg below knee: Secondary | ICD-10-CM

## 2015-08-18 DIAGNOSIS — Z87891 Personal history of nicotine dependence: Secondary | ICD-10-CM | POA: Insufficient documentation

## 2015-08-18 DIAGNOSIS — Z7984 Long term (current) use of oral hypoglycemic drugs: Secondary | ICD-10-CM

## 2015-08-18 DIAGNOSIS — Z79899 Other long term (current) drug therapy: Secondary | ICD-10-CM

## 2015-08-18 DIAGNOSIS — I10 Essential (primary) hypertension: Secondary | ICD-10-CM

## 2015-08-18 DIAGNOSIS — E785 Hyperlipidemia, unspecified: Secondary | ICD-10-CM | POA: Insufficient documentation

## 2015-08-18 DIAGNOSIS — Z955 Presence of coronary angioplasty implant and graft: Secondary | ICD-10-CM

## 2015-08-18 DIAGNOSIS — I252 Old myocardial infarction: Secondary | ICD-10-CM

## 2015-08-18 DIAGNOSIS — K219 Gastro-esophageal reflux disease without esophagitis: Secondary | ICD-10-CM

## 2015-08-18 LAB — URINE MICROSCOPIC-ADD ON

## 2015-08-18 LAB — URINALYSIS, ROUTINE W REFLEX MICROSCOPIC
BILIRUBIN URINE: NEGATIVE
Glucose, UA: NEGATIVE mg/dL
Ketones, ur: NEGATIVE mg/dL
NITRITE: NEGATIVE
PROTEIN: NEGATIVE mg/dL
SPECIFIC GRAVITY, URINE: 1.011 (ref 1.005–1.030)
pH: 5 (ref 5.0–8.0)

## 2015-08-18 LAB — GLUCOSE, CAPILLARY: Glucose-Capillary: 138 mg/dL — ABNORMAL HIGH (ref 65–99)

## 2015-08-18 MED ORDER — LIDOCAINE HCL 1 % IJ SOLN
INTRAMUSCULAR | Status: AC
  Filled 2015-08-18: qty 20

## 2015-08-18 MED ORDER — CHLORHEXIDINE GLUCONATE 4 % EX LIQD: 60.0000 mL | Freq: Once | CUTANEOUS | Status: DC

## 2015-08-18 MED ORDER — SODIUM CHLORIDE 0.9 % IV SOLN: INTRAVENOUS | Status: DC

## 2015-08-18 MED ORDER — VANCOMYCIN HCL IN DEXTROSE 1-5 GM/200ML-% IV SOLN
1000.0000 mg | INTRAVENOUS | Status: AC
  Administered 2015-08-19: 1000 mg via INTRAVENOUS
  Filled 2015-08-18: qty 200

## 2015-08-18 MED ORDER — HEPARIN SOD (PORK) LOCK FLUSH 100 UNIT/ML IV SOLN
INTRAVENOUS | Status: DC
Start: 2015-08-18 — End: 2015-08-19
  Filled 2015-08-18: qty 5

## 2015-08-18 NOTE — Progress Notes (Signed)
Anesthesia Chart Review: Patient is a 70 year old female scheduled for left femoral-tibial artery bypass tomorrow by Dr. Donnetta Hutching. PAT was at 3 PM on 08/18/15. 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  PCP is listed as Dr. Reynold Bowen. Cardiologist is Dr. Sherren Mocha, last visit 07/10/15. He writes: 1. CAD, native vessel, without symptoms of angina. The patient appears stable from a cardiac perspective. She did have a significant bleeding episode last year, but this occurred on dual antiplatelet therapy with aspirin and Plavix. I think the risk/benefit favors resuming aspirin 81 mg daily. She will watch for signs of bleeding. Considering her extensive cardiovascular disease she should be on antiplatelet therapy if at all possible. One year follow-up recommended.  Meds include Xanax, amoxcillin, ASA, Dexilant, Lasix, Tresiba, Niferex, Imdur, Demeral, Nitro, Toprol XL, Lovaza, Actoplus Met, Kdur, Zetia, Bactrim (has been on maintenance for > 6 months), Januvia, Crestor, ramipril.  07/10/15 EKG: NSR.  09/10/12 Echo: Study Conclusions Left ventricle: The cavity size was normal. Wall thickness was increased in a pattern of mild LVH. Systolic function was vigorous. The estimated ejection fraction was in the range of 65% to 70%. Doppler parameters are consistent with abnormal left ventricular relaxation (grade 1 diastolic dysfunction). Trivial MR.     02/26/09 Cardiac cath: ANGIOGRAPHIC DATA: 1. On plain fluoroscopy, there is fairly heavily calcified coronaries. The left main itself is calcified at the ostium. 2. Ventriculography in the RAO  projection reveals overall ejection fraction slightly in excess of 50%. There may be some inferobasal hypokinesis. 3. The left main is without significant focal narrowing, although there is heavy calcification at the ostium and some mild luminal irregularity. 4. The LAD courses to the apex. This also demonstrates a lot of heavy calcification throughout. There is a tiny first diagonal with 80% narrowing and a moderate-sized second diagonal with about 50% narrowing. There are scattered areas of irregularity with about 40% in luminal reduction in the both the mid and mid distal vessels. No critical stenoses are noted. 5. The circumflex is a dominant vessel. There is a first marginal branch which appears largely unchanged from the previous study and represents an intermediate distribution and has about 60% ostial narrowing and tandem stenoses of about 50%. Just after the takeoff of this vessel in the midvessel is a focal eccentric 90% hazy stenosis compatible with the acute presentation. This stenosis ends proximal to the takeoff of the large second marginal which is a bifurcating vessel. There is a posterolateral branch with about 40% ostial narrowing and the posterior descending branch bifurcates distally. There is luminal irregularity, but a high-grade stenosis is not visualized, although visualization is somewhat difficult in this distal territory. 6. The right coronary artery is a nondominant vessel. It is tiny in caliber. The RV branch has an 80-90% stenosis in the distal portion of the vessel which supplies a small tiny inferior branch has 90% narrowing. This vessel constitutes about 1-1.5 mm artery at tops. CONCLUSION: 1. Preserved overall LV function with mild inferior wall motion abnormality. 2. Critical stenosis of the mid circumflex with successful stenting percutaneously using Xience V drug-eluting platform with appropriate post dilatation. 3. Scattered diabetic coronary artery disease  as  noted above. DISPOSITION: The patient presented with an acute coronary syndrome. She has multiple cardiac problems. Fortunately, she is a non-insulinrequiring diabetic with low SYNTAX score, and no evidence of criticalLAD disease. The circumflex territory is in the midvessel after thefirst marginal. The lesion is focal. The vessel was large. We electedto use a drug-eluting stent despite social situation, and this has beendiscussed preprocedurally with the patient and her family by Dr. Burt Knack, and we were in agreement prior to placement of a drug-eluting platformThe patient has borderline elevation in troponins, and as such weutilized IIb/IIIa strategy as she is not clopidogrel naive. She will need close followup in the Cardiology Clinic and adherence to strict guidelines.  04/01/14 Carotid U/S: Proximal R CCA stenosis. RICA velocities suggest < 50% stenosis. Patent left CEA with velocities suggest 40-59% stenosis.  06/05/15 1V CXR: IMPRESSION: 1. Central line placement as described. No pneumothorax. 2. Question calcifications adjacent to the humeral heads bilaterally versus sequela of prior trauma.  Patient refused lab draw at PAT. PAT RN spoke with Dr. Conrad Berlin with plans for labs from PICC in AM prior to surgery.   Patient with known CAD, but recent cardiology follow-up with no new testing ordered. Further evaluation of patient and review of labs by her anesthesiologist tomorrow.   [facility] Short Stay Center/Anesthesiology Phone [phone] 08/18/2015 5:24 PM

## 2015-08-18 NOTE — Pre-Procedure Instructions (Signed)
[place]  08/18/2015       Your procedure is scheduled on Wednesday Feb 15th.  Report to [facility] Admitting at 830 A.M.  Call this number if you have problems the morning of surgery:  [phone]   Remember:  Do not eat food or drink liquids after midnight tonight.  Take these medicines the morning of surgery with A SIP OF WATER alprazolam (xanax) if needed, dexilant if needed, isosorbide mononitrate, metoprolol succinate (toprol xl)  STOP: ALL Vitamins, Supplements, Effient and Herbal Medications, Fish Oils, Aspirins, NSAIDs (Nonsteroidal Anti-inflammatories such as Ibuprofen, Aleve, or Advil), and Goody's/BC Powders today.  How to Manage Your Diabetes Before Surgery   Why is it important to control my blood sugar before and after surgery?   Improving blood sugar levels before and after surgery helps healing and can limit problems.  A way of improving blood sugar control is eating a healthy diet by:  - Eating less sugar and carbohydrates  - Increasing activity/exercise  - Talk with your doctor about reaching your blood sugar goals  High blood sugars (greater than 180 mg/dL) can raise your risk of infections and slow down your recovery so you will need to focus on controlling your diabetes during the weeks before surgery.  Make sure that the doctor who takes care of your diabetes knows about your planned surgery including the date and location.  How do I manage my blood sugars before surgery?   Check your blood sugar at least 4 times a day, 2 days before surgery to make sure that they are not too high or low.   Check your blood sugar the morning of your surgery when you wake up and every 2               hours until you get to the Short-Stay unit.  If your blood sugar is less than 70 mg/dL, you will need to treat for low blood sugar by:  Treat a low blood sugar (less than 70 mg/dL) with 1/2 cup of clear juice (cranberry or apple), 4 glucose tablets,  OR glucose gel.  Recheck blood sugar in 15 minutes after treatment (to make sure it is greater than 70 mg/dL).  If blood sugar is not greater than 70 mg/dL on re-check, call [phone] for further instructions.   Report your blood sugar to the Short-Stay nurse when you get to Short-Stay.  References:  [facility], 2007 "How to Manage your Diabetes Before and After Surgery".  What do I do about my diabetes medications?   Do not take oral diabetes medicines (pills) the morning of surgery. (pioglitazone-metformin actoplus met)    THE MORNING OF SURGERY, take 8 units of tresiba Insulin.    Do not take other diabetes injectables the day of surgery including Byetta, Victoza, Bydureon, and Trulicity.     Do not wear jewelry, make-up or nail polish.  Do not wear lotions, powders, or perfumes.  You may wear deodorant.  Do not shave 48 hours prior to surgery.  Men may shave face and neck.  Do not bring valuables to the hospital.  [facility] is not responsible for any belongings or valuables.  Contacts, dentures or bridgework may not be worn into surgery.  Leave your suitcase in the car.  After surgery it may be brought to your room.  For patients admitted to the hospital, discharge time will be determined by your treatment team.  Patients discharged the day of  surgery will not be allowed to drive home.        Preparing for Surgery at [facility]  Before surgery, you can play an important role.  Because skin is not sterile, your skin needs to be as free of germs as possible.  You can reduce the number of germs on your skin by washing with CHG (chlorahexidine gluconate) Soap before surgery.  CHG is an antiseptic cleaner with kills germs and bonds with the skin to continue killing germs even after washing.   Please do not use if you have an allergy to CHG or antibacterial soaps.  If your skin becomes reddened/irritated stop using the CHG.  Do not shave  (including legs and underarms) for at least 48 hours prior to first CHG shower.  It is okay to shave your face.  Please follow these instructions carefully:  1. Shower with CHG Soap the night before surgery and the morning of Surgery. 2. If you choose to wash your hair, wash your hair first as usual with your normal shampoo. 3. After you shampoo, rinse your hair and body thoroughly to remove the Shampoo. 4. Use CHG as you would any other liquid soap. You can apply chg directly to the skin and wash gently with scrungie or a clean washcloth. 5. Apply the CHG Soap to your body ONLY FROM THE NECK DOWN. Do not use on open wounds or open sores. Avoid contact with your eyes, ears, mouth and genitals (private parts). Wash genitals (private parts) with your normal soap. 6. Wash thoroughly, paying special attention to the area where your surgery will be performed. 7. Thoroughly rinse your body with warm water from the neck down. 8. DO NOT shower/wash with your normal soap after using and rinsing off the CHG Soap. 9. Pat yourself dry with a clean towel.  10. Wear clean pajamas.  11. Place clean sheets on your bed the night of your first shower and do not sleep with pets.  Day of Surgery  Do not apply any lotions/deodorants the morning of surgery. Please wear clean clothes to the hospital/surgery center.  Please read over the following fact sheets that you were given. Pain Booklet, Coughing and Deep Breathing, Blood Transfusion Information, MRSA Information and Surgical Site Infection Prevention

## 2015-08-18 NOTE — Procedures (Signed)
Successful placement of dual lumen PICC line to right basilic vein. Length 37 cm Tip at lower SVC/RA No complications Ready for use.  Auriella Wieand PA-C 1:50 PM

## 2015-08-18 NOTE — Progress Notes (Signed)
Patient refusing lab draw in PAT.  Dr. Michelle Piper states it is okay to get labs from PICC in the AM prior to surgery.

## 2015-08-19 ENCOUNTER — Inpatient Hospital Stay (HOSPITAL_COMMUNITY): Payer: Medicare Other | Admitting: Anesthesiology

## 2015-08-19 ENCOUNTER — Inpatient Hospital Stay (HOSPITAL_COMMUNITY)
Admission: RE | Admit: 2015-08-19 | Discharge: 2015-08-21 | DRG: 253 | Disposition: A | Discharge status: Skilled Nursing Facility | Payer: Medicare Other | Source: Ambulatory Visit | Attending: Vascular Surgery | Admitting: Vascular Surgery

## 2015-08-19 ENCOUNTER — Encounter (HOSPITAL_COMMUNITY)
Admission: RE | Disposition: A | Discharge status: Skilled Nursing Facility | Payer: Self-pay | Source: Ambulatory Visit | Attending: Vascular Surgery

## 2015-08-19 ENCOUNTER — Inpatient Hospital Stay (HOSPITAL_COMMUNITY): Payer: Medicare Other | Admitting: Vascular Surgery

## 2015-08-19 ENCOUNTER — Encounter (HOSPITAL_COMMUNITY): Payer: Self-pay | Admitting: *Deleted

## 2015-08-19 DIAGNOSIS — Z87891 Personal history of nicotine dependence: Secondary | ICD-10-CM | POA: Diagnosis not present

## 2015-08-19 DIAGNOSIS — Z95828 Presence of other vascular implants and grafts: Secondary | ICD-10-CM

## 2015-08-19 DIAGNOSIS — G8918 Other acute postprocedural pain: Secondary | ICD-10-CM

## 2015-08-19 DIAGNOSIS — I999 Unspecified disorder of circulatory system: Secondary | ICD-10-CM

## 2015-08-19 DIAGNOSIS — I771 Stricture of artery: Principal | ICD-10-CM | POA: Diagnosis present

## 2015-08-19 DIAGNOSIS — Z79899 Other long term (current) drug therapy: Secondary | ICD-10-CM

## 2015-08-19 DIAGNOSIS — D62 Acute posthemorrhagic anemia: Secondary | ICD-10-CM | POA: Diagnosis not present

## 2015-08-19 DIAGNOSIS — I252 Old myocardial infarction: Secondary | ICD-10-CM | POA: Diagnosis not present

## 2015-08-19 DIAGNOSIS — Z794 Long term (current) use of insulin: Secondary | ICD-10-CM

## 2015-08-19 DIAGNOSIS — I998 Other disorder of circulatory system: Secondary | ICD-10-CM | POA: Diagnosis not present

## 2015-08-19 DIAGNOSIS — E1149 Type 2 diabetes mellitus with other diabetic neurological complication: Secondary | ICD-10-CM | POA: Diagnosis present

## 2015-08-19 DIAGNOSIS — E785 Hyperlipidemia, unspecified: Secondary | ICD-10-CM | POA: Diagnosis present

## 2015-08-19 DIAGNOSIS — I7300 Raynaud's syndrome without gangrene: Secondary | ICD-10-CM | POA: Diagnosis present

## 2015-08-19 DIAGNOSIS — F419 Anxiety disorder, unspecified: Secondary | ICD-10-CM | POA: Diagnosis present

## 2015-08-19 DIAGNOSIS — Z7982 Long term (current) use of aspirin: Secondary | ICD-10-CM | POA: Diagnosis not present

## 2015-08-19 DIAGNOSIS — Z89511 Acquired absence of right leg below knee: Secondary | ICD-10-CM | POA: Diagnosis not present

## 2015-08-19 DIAGNOSIS — E1151 Type 2 diabetes mellitus with diabetic peripheral angiopathy without gangrene: Secondary | ICD-10-CM | POA: Diagnosis present

## 2015-08-19 DIAGNOSIS — I739 Peripheral vascular disease, unspecified: Secondary | ICD-10-CM | POA: Diagnosis present

## 2015-08-19 DIAGNOSIS — Z955 Presence of coronary angioplasty implant and graft: Secondary | ICD-10-CM

## 2015-08-19 DIAGNOSIS — E11649 Type 2 diabetes mellitus with hypoglycemia without coma: Secondary | ICD-10-CM | POA: Diagnosis not present

## 2015-08-19 DIAGNOSIS — M79672 Pain in left foot: Secondary | ICD-10-CM

## 2015-08-19 DIAGNOSIS — I10 Essential (primary) hypertension: Secondary | ICD-10-CM | POA: Diagnosis present

## 2015-08-19 DIAGNOSIS — L8990 Pressure ulcer of unspecified site, unspecified stage: Secondary | ICD-10-CM | POA: Insufficient documentation

## 2015-08-19 DIAGNOSIS — L97529 Non-pressure chronic ulcer of other part of left foot with unspecified severity: Secondary | ICD-10-CM | POA: Diagnosis present

## 2015-08-19 DIAGNOSIS — E11621 Type 2 diabetes mellitus with foot ulcer: Secondary | ICD-10-CM | POA: Diagnosis present

## 2015-08-19 DIAGNOSIS — K219 Gastro-esophageal reflux disease without esophagitis: Secondary | ICD-10-CM | POA: Diagnosis present

## 2015-08-19 DIAGNOSIS — I2510 Atherosclerotic heart disease of native coronary artery without angina pectoris: Secondary | ICD-10-CM | POA: Diagnosis present

## 2015-08-19 HISTORY — PX: FEMORAL-TIBIAL BYPASS GRAFT: SHX938

## 2015-08-19 LAB — CBC
HCT: 30.9 % — ABNORMAL LOW (ref 36.0–46.0)
HEMOGLOBIN: 9.7 g/dL — AB (ref 12.0–15.0)
MCH: 27.6 pg (ref 26.0–34.0)
MCHC: 31.4 g/dL (ref 30.0–36.0)
MCV: 87.8 fL (ref 78.0–100.0)
PLATELETS: 332 10*3/uL (ref 150–400)
RBC: 3.52 MIL/uL — AB (ref 3.87–5.11)
RDW: 14.8 % (ref 11.5–15.5)
WBC: 9.8 10*3/uL (ref 4.0–10.5)

## 2015-08-19 LAB — PROTIME-INR
INR: 1.07 (ref 0.00–1.49)
Prothrombin Time: 14.1 seconds (ref 11.6–15.2)

## 2015-08-19 LAB — COMPREHENSIVE METABOLIC PANEL
ALT: 10 U/L — AB (ref 14–54)
ANION GAP: 10 (ref 5–15)
AST: 16 U/L (ref 15–41)
Albumin: 2.8 g/dL — ABNORMAL LOW (ref 3.5–5.0)
Alkaline Phosphatase: 86 U/L (ref 38–126)
BUN: 17 mg/dL (ref 6–20)
CHLORIDE: 104 mmol/L (ref 101–111)
CO2: 27 mmol/L (ref 22–32)
CREATININE: 1.14 mg/dL — AB (ref 0.44–1.00)
Calcium: 9 mg/dL (ref 8.9–10.3)
GFR calc non Af Amer: 48 mL/min — ABNORMAL LOW (ref >60)
GFR, EST AFRICAN AMERICAN: 55 mL/min — AB (ref >60)
Glucose, Bld: 88 mg/dL (ref 65–99)
POTASSIUM: 4.5 mmol/L (ref 3.5–5.1)
SODIUM: 141 mmol/L (ref 135–145)
Total Bilirubin: 0.4 mg/dL (ref 0.3–1.2)
Total Protein: 6.3 g/dL — ABNORMAL LOW (ref 6.5–8.1)

## 2015-08-19 LAB — SURGICAL PCR SCREEN
MRSA, PCR: NEGATIVE
STAPHYLOCOCCUS AUREUS: NEGATIVE

## 2015-08-19 LAB — TYPE AND SCREEN
ABO/RH(D): O POS
ANTIBODY SCREEN: NEGATIVE

## 2015-08-19 LAB — GLUCOSE, CAPILLARY
GLUCOSE-CAPILLARY: 85 mg/dL (ref 65–99)
GLUCOSE-CAPILLARY: 93 mg/dL (ref 65–99)
GLUCOSE-CAPILLARY: 49 mg/dL — AB (ref 65–99)
GLUCOSE-CAPILLARY: 52 mg/dL — AB (ref 65–99)
GLUCOSE-CAPILLARY: 135 mg/dL — AB (ref 65–99)
Glucose-Capillary: 42 mg/dL — CL (ref 65–99)
Glucose-Capillary: 92 mg/dL (ref 65–99)

## 2015-08-19 LAB — APTT: aPTT: 32 seconds (ref 24–37)

## 2015-08-19 SURGERY — CREATION, BYPASS, ARTERIAL, FEMORAL TO TIBIAL, USING GRAFT
Anesthesia: General | Site: Leg Upper | Laterality: Left

## 2015-08-19 MED ORDER — LIDOCAINE HCL (CARDIAC) 20 MG/ML IV SOLN
INTRAVENOUS | Status: AC
  Filled 2015-08-19: qty 5

## 2015-08-19 MED ORDER — ASPIRIN EC 81 MG PO TBEC
81.0000 mg | DELAYED_RELEASE_TABLET | Freq: Every day | ORAL | Status: DC
Start: 2015-08-20 — End: 2015-08-21
  Administered 2015-08-20 – 2015-08-21 (×2): 81 mg via ORAL
  Filled 2015-08-19 (×2): qty 1

## 2015-08-19 MED ORDER — HEPARIN SODIUM (PORCINE) 1000 UNIT/ML IJ SOLN
INTRAMUSCULAR | Status: DC | PRN
  Administered 2015-08-19: 7000 [IU] via INTRAVENOUS

## 2015-08-19 MED ORDER — SODIUM CHLORIDE 0.9 % IV SOLN
INTRAVENOUS | Status: DC | PRN
  Administered 2015-08-19: 500 mL

## 2015-08-19 MED ORDER — DOCUSATE SODIUM 100 MG PO CAPS
100.0000 mg | ORAL_CAPSULE | Freq: Every day | ORAL | Status: DC
  Administered 2015-08-20: 100 mg via ORAL
  Filled 2015-08-19 (×2): qty 1

## 2015-08-19 MED ORDER — GUAIFENESIN-DM 100-10 MG/5ML PO SYRP: 15.0000 mL | ORAL_SOLUTION | ORAL | Status: DC | PRN

## 2015-08-19 MED ORDER — MAGNESIUM HYDROXIDE 400 MG/5ML PO SUSP: 30.0000 mL | Freq: Every day | ORAL | Status: DC | PRN

## 2015-08-19 MED ORDER — POTASSIUM CHLORIDE CRYS ER 20 MEQ PO TBCR
20.0000 meq | EXTENDED_RELEASE_TABLET | Freq: Every day | ORAL | Status: DC | PRN
Start: 2015-08-19 — End: 2015-08-21

## 2015-08-19 MED ORDER — FENTANYL CITRATE (PF) 100 MCG/2ML IJ SOLN
INTRAMUSCULAR | Status: DC | PRN
  Administered 2015-08-19: 100 ug via INTRAVENOUS
  Administered 2015-08-19 (×3): 50 ug via INTRAVENOUS
  Administered 2015-08-19: 100 ug via INTRAVENOUS
  Administered 2015-08-19 (×3): 50 ug via INTRAVENOUS

## 2015-08-19 MED ORDER — PHENYLEPHRINE HCL 10 MG/ML IJ SOLN
10.0000 mg | INTRAVENOUS | Status: DC | PRN
  Administered 2015-08-19: 50 ug/min via INTRAVENOUS

## 2015-08-19 MED ORDER — MORPHINE SULFATE (PF) 2 MG/ML IV SOLN
2.0000 mg | INTRAVENOUS | Status: DC | PRN
  Administered 2015-08-19: 2 mg via INTRAVENOUS
  Administered 2015-08-19: 4 mg via INTRAVENOUS
  Administered 2015-08-19: 2 mg via INTRAVENOUS
  Administered 2015-08-20 – 2015-08-21 (×8): 4 mg via INTRAVENOUS
  Filled 2015-08-19 (×2): qty 2
  Filled 2015-08-19: qty 1
  Filled 2015-08-19: qty 2
  Filled 2015-08-19: qty 1
  Filled 2015-08-19 (×6): qty 2

## 2015-08-19 MED ORDER — ROSUVASTATIN CALCIUM 10 MG PO TABS: 10.0000 mg | ORAL_TABLET | ORAL | Status: DC

## 2015-08-19 MED ORDER — PIOGLITAZONE HCL 15 MG PO TABS
15.0000 mg | ORAL_TABLET | Freq: Every day | ORAL | Status: DC
Start: 2015-08-20 — End: 2015-08-19
  Filled 2015-08-19: qty 1

## 2015-08-19 MED ORDER — ROCURONIUM BROMIDE 100 MG/10ML IV SOLN
INTRAVENOUS | Status: DC | PRN
  Administered 2015-08-19 (×2): 20 mg via INTRAVENOUS

## 2015-08-19 MED ORDER — SALINE SPRAY 0.65 % NA SOLN
2.0000 | NASAL | Status: DC | PRN
  Filled 2015-08-19: qty 44

## 2015-08-19 MED ORDER — SODIUM CHLORIDE 0.9 % IJ SOLN
INTRAMUSCULAR | Status: AC
  Filled 2015-08-19: qty 10

## 2015-08-19 MED ORDER — BISACODYL 10 MG RE SUPP: 10.0000 mg | Freq: Every day | RECTAL | Status: DC | PRN

## 2015-08-19 MED ORDER — OXYCODONE HCL 5 MG/5ML PO SOLN: 5.0000 mg | Freq: Once | ORAL | Status: DC | PRN

## 2015-08-19 MED ORDER — PHENOL 1.4 % MT LIQD: 1.0000 | OROMUCOSAL | Status: DC | PRN

## 2015-08-19 MED ORDER — FENTANYL CITRATE (PF) 250 MCG/5ML IJ SOLN
INTRAMUSCULAR | Status: AC
  Filled 2015-08-19: qty 5

## 2015-08-19 MED ORDER — ALPRAZOLAM 0.25 MG PO TABS
0.2500 mg | ORAL_TABLET | Freq: Two times a day (BID) | ORAL | Status: DC
  Administered 2015-08-19 – 2015-08-21 (×4): 0.25 mg via ORAL
  Filled 2015-08-19 (×4): qty 1

## 2015-08-19 MED ORDER — ENOXAPARIN SODIUM 30 MG/0.3ML ~~LOC~~ SOLN
30.0000 mg | SUBCUTANEOUS | Status: DC
  Administered 2015-08-20: 30 mg via SUBCUTANEOUS
  Filled 2015-08-19: qty 0.3

## 2015-08-19 MED ORDER — VANCOMYCIN HCL IN DEXTROSE 1-5 GM/200ML-% IV SOLN
1000.0000 mg | Freq: Two times a day (BID) | INTRAVENOUS | Status: AC
  Administered 2015-08-19 – 2015-08-20 (×2): 1000 mg via INTRAVENOUS
  Filled 2015-08-19 (×2): qty 200

## 2015-08-19 MED ORDER — RAMIPRIL 1.25 MG PO CAPS
1.2500 mg | ORAL_CAPSULE | Freq: Every day | ORAL | Status: DC
  Administered 2015-08-20 – 2015-08-21 (×2): 1.25 mg via ORAL
  Filled 2015-08-19 (×2): qty 1

## 2015-08-19 MED ORDER — SUGAMMADEX SODIUM 200 MG/2ML IV SOLN
INTRAVENOUS | Status: DC | PRN
  Administered 2015-08-19: 137.8 mg via INTRAVENOUS

## 2015-08-19 MED ORDER — SUGAMMADEX SODIUM 200 MG/2ML IV SOLN
INTRAVENOUS | Status: AC
  Filled 2015-08-19: qty 2

## 2015-08-19 MED ORDER — ALUM & MAG HYDROXIDE-SIMETH 200-200-20 MG/5ML PO SUSP: 15.0000 mL | ORAL | Status: DC | PRN

## 2015-08-19 MED ORDER — POTASSIUM CHLORIDE CRYS ER 10 MEQ PO TBCR
10.0000 meq | EXTENDED_RELEASE_TABLET | Freq: Two times a day (BID) | ORAL | Status: DC
  Administered 2015-08-19 – 2015-08-21 (×4): 10 meq via ORAL
  Filled 2015-08-19 (×7): qty 1

## 2015-08-19 MED ORDER — SODIUM CHLORIDE (HYPERTONIC) 5 % OP SOLN
1.0000 [drp] | Freq: Every day | OPHTHALMIC | Status: DC | PRN
  Filled 2015-08-19: qty 15

## 2015-08-19 MED ORDER — PROCHLORPERAZINE MALEATE 10 MG PO TABS
10.0000 mg | ORAL_TABLET | Freq: Four times a day (QID) | ORAL | Status: DC | PRN
  Filled 2015-08-19: qty 1

## 2015-08-19 MED ORDER — PROTAMINE SULFATE 10 MG/ML IV SOLN
INTRAVENOUS | Status: DC | PRN
  Administered 2015-08-19: 20 mg via INTRAVENOUS
  Administered 2015-08-19: 10 mg via INTRAVENOUS
  Administered 2015-08-19: 20 mg via INTRAVENOUS

## 2015-08-19 MED ORDER — PANTOPRAZOLE SODIUM 40 MG PO TBEC: 40.0000 mg | DELAYED_RELEASE_TABLET | Freq: Every day | ORAL | Status: DC

## 2015-08-19 MED ORDER — OXYCODONE HCL 5 MG PO TABS: 5.0000 mg | ORAL_TABLET | Freq: Once | ORAL | Status: DC | PRN

## 2015-08-19 MED ORDER — EPHEDRINE SULFATE 50 MG/ML IJ SOLN
INTRAMUSCULAR | Status: AC
  Filled 2015-08-19: qty 1

## 2015-08-19 MED ORDER — LACTATED RINGERS IV SOLN
INTRAVENOUS | Status: DC | PRN
  Administered 2015-08-19: 10:00:00 via INTRAVENOUS

## 2015-08-19 MED ORDER — PHENYLEPHRINE HCL 10 MG/ML IJ SOLN
INTRAMUSCULAR | Status: DC | PRN
  Administered 2015-08-19 (×2): 80 ug via INTRAVENOUS

## 2015-08-19 MED ORDER — DEXLANSOPRAZOLE 60 MG PO CPDR
60.0000 mg | DELAYED_RELEASE_CAPSULE | Freq: Two times a day (BID) | ORAL | Status: DC
  Administered 2015-08-20 – 2015-08-21 (×3): 60 mg via ORAL
  Filled 2015-08-19: qty 1

## 2015-08-19 MED ORDER — FENTANYL CITRATE (PF) 100 MCG/2ML IJ SOLN: 25.0000 ug | INTRAMUSCULAR | Status: DC | PRN

## 2015-08-19 MED ORDER — METOPROLOL TARTRATE 1 MG/ML IV SOLN
2.0000 mg | INTRAVENOUS | Status: DC | PRN
Start: 2015-08-19 — End: 2015-08-21

## 2015-08-19 MED ORDER — MEPERIDINE HCL 50 MG PO TABS
50.0000 mg | ORAL_TABLET | ORAL | Status: DC | PRN
  Administered 2015-08-19: 50 mg via ORAL
  Filled 2015-08-19: qty 1

## 2015-08-19 MED ORDER — COLLAGENASE 250 UNIT/GM EX OINT
1.0000 "application " | TOPICAL_OINTMENT | Freq: Every day | CUTANEOUS | Status: DC
  Administered 2015-08-19 – 2015-08-21 (×3): 1 via TOPICAL
  Filled 2015-08-19: qty 30

## 2015-08-19 MED ORDER — ONDANSETRON HCL 4 MG/2ML IJ SOLN
INTRAMUSCULAR | Status: DC | PRN
  Administered 2015-08-19: 4 mg via INTRAVENOUS

## 2015-08-19 MED ORDER — METOPROLOL SUCCINATE ER 50 MG PO TB24
50.0000 mg | ORAL_TABLET | Freq: Two times a day (BID) | ORAL | Status: DC
  Administered 2015-08-19 – 2015-08-21 (×4): 50 mg via ORAL
  Filled 2015-08-19 (×4): qty 1

## 2015-08-19 MED ORDER — CALCIUM CARBONATE ANTACID 500 MG PO CHEW
1000.0000 mg | CHEWABLE_TABLET | Freq: Every day | ORAL | Status: DC | PRN
  Administered 2015-08-21: 1000 mg via ORAL
  Filled 2015-08-19: qty 5

## 2015-08-19 MED ORDER — METOCLOPRAMIDE HCL 5 MG/ML IJ SOLN
INTRAMUSCULAR | Status: DC | PRN
  Administered 2015-08-19: 10 mg via INTRAVENOUS

## 2015-08-19 MED ORDER — 0.9 % SODIUM CHLORIDE (POUR BTL) OPTIME
TOPICAL | Status: DC | PRN
  Administered 2015-08-19 (×2): 1000 mL

## 2015-08-19 MED ORDER — OMEGA-3-ACID ETHYL ESTERS 1 G PO CAPS
2.0000 g | ORAL_CAPSULE | Freq: Two times a day (BID) | ORAL | Status: DC
  Administered 2015-08-19 – 2015-08-21 (×4): 2 g via ORAL
  Filled 2015-08-19 (×5): qty 2

## 2015-08-19 MED ORDER — INSULIN ASPART 100 UNIT/ML ~~LOC~~ SOLN: 0.0000 [IU] | Freq: Three times a day (TID) | SUBCUTANEOUS | Status: DC

## 2015-08-19 MED ORDER — NITROGLYCERIN 0.4 MG SL SUBL: 0.4000 mg | SUBLINGUAL_TABLET | SUBLINGUAL | Status: DC | PRN

## 2015-08-19 MED ORDER — SODIUM CHLORIDE 0.9 % IV SOLN
500.0000 mL | Freq: Once | INTRAVENOUS | Status: AC | PRN
  Administered 2015-08-19: 500 mL via INTRAVENOUS

## 2015-08-19 MED ORDER — ONDANSETRON HCL 4 MG/2ML IJ SOLN: 4.0000 mg | Freq: Four times a day (QID) | INTRAMUSCULAR | Status: DC | PRN

## 2015-08-19 MED ORDER — SODIUM CHLORIDE 0.9 % IV SOLN
INTRAVENOUS | Status: DC
  Administered 2015-08-19: 16:00:00 via INTRAVENOUS

## 2015-08-19 MED ORDER — VITAMIN D (ERGOCALCIFEROL) 1.25 MG (50000 UNIT) PO CAPS
50000.0000 [IU] | ORAL_CAPSULE | ORAL | Status: DC
  Filled 2015-08-19: qty 1

## 2015-08-19 MED ORDER — PROPOFOL 10 MG/ML IV BOLUS
INTRAVENOUS | Status: AC
  Filled 2015-08-19: qty 20

## 2015-08-19 MED ORDER — ACETAMINOPHEN 325 MG RE SUPP
325.0000 mg | RECTAL | Status: DC | PRN
  Filled 2015-08-19: qty 2

## 2015-08-19 MED ORDER — METFORMIN HCL 850 MG PO TABS
850.0000 mg | ORAL_TABLET | Freq: Every day | ORAL | Status: DC
  Filled 2015-08-19: qty 1

## 2015-08-19 MED ORDER — ACETAMINOPHEN 325 MG PO TABS
325.0000 mg | ORAL_TABLET | ORAL | Status: DC | PRN
  Filled 2015-08-19: qty 2

## 2015-08-19 MED ORDER — HYDRALAZINE HCL 20 MG/ML IJ SOLN: 5.0000 mg | INTRAMUSCULAR | Status: DC | PRN

## 2015-08-19 MED ORDER — PIOGLITAZONE HCL-METFORMIN HCL 15-850 MG PO TABS: 1.0000 | ORAL_TABLET | Freq: Every day | ORAL | Status: DC

## 2015-08-19 MED ORDER — ROCURONIUM BROMIDE 50 MG/5ML IV SOLN
INTRAVENOUS | Status: AC
  Filled 2015-08-19: qty 1

## 2015-08-19 MED ORDER — DIPHENHYDRAMINE HCL 50 MG/ML IJ SOLN
INTRAMUSCULAR | Status: DC | PRN
  Administered 2015-08-19: 12.5 mg via INTRAVENOUS

## 2015-08-19 MED ORDER — SCOPOLAMINE 1 MG/3DAYS TD PT72
1.0000 | MEDICATED_PATCH | TRANSDERMAL | Status: DC
Start: 2015-08-19 — End: 2015-08-19
  Administered 2015-08-19: 10:00:00 via TRANSDERMAL

## 2015-08-19 MED ORDER — FUROSEMIDE 40 MG PO TABS
40.0000 mg | ORAL_TABLET | Freq: Every day | ORAL | Status: DC
  Administered 2015-08-20 – 2015-08-21 (×2): 40 mg via ORAL
  Filled 2015-08-19 (×3): qty 1

## 2015-08-19 MED ORDER — ISOSORBIDE MONONITRATE ER 30 MG PO TB24
30.0000 mg | ORAL_TABLET | Freq: Every day | ORAL | Status: DC
  Administered 2015-08-20 – 2015-08-21 (×2): 30 mg via ORAL
  Filled 2015-08-19 (×2): qty 1

## 2015-08-19 MED ORDER — RISAQUAD PO CAPS
1.0000 | ORAL_CAPSULE | Freq: Every day | ORAL | Status: DC
  Administered 2015-08-19 – 2015-08-21 (×3): 1 via ORAL
  Filled 2015-08-19 (×3): qty 1

## 2015-08-19 MED ORDER — LABETALOL HCL 5 MG/ML IV SOLN: 10.0000 mg | INTRAVENOUS | Status: DC | PRN

## 2015-08-19 MED ORDER — SCOPOLAMINE 1 MG/3DAYS TD PT72
MEDICATED_PATCH | TRANSDERMAL | Status: AC
  Filled 2015-08-19: qty 1

## 2015-08-19 MED ORDER — EZETIMIBE 10 MG PO TABS
10.0000 mg | ORAL_TABLET | Freq: Every day | ORAL | Status: DC
Start: 2015-08-19 — End: 2015-08-21
  Administered 2015-08-19 – 2015-08-21 (×3): 10 mg via ORAL
  Filled 2015-08-19 (×4): qty 1

## 2015-08-19 MED ORDER — SULFAMETHOXAZOLE-TRIMETHOPRIM 800-160 MG PO TABS
1.0000 | ORAL_TABLET | Freq: Every day | ORAL | Status: DC
  Administered 2015-08-19 – 2015-08-21 (×3): 1 via ORAL
  Filled 2015-08-19 (×3): qty 1

## 2015-08-19 SURGICAL SUPPLY — 54 items
APL SKNCLS STERI-STRIP NONHPOA (GAUZE/BANDAGES/DRESSINGS) ×2
BANDAGE ESMARK 6X9 LF (GAUZE/BANDAGES/DRESSINGS) IMPLANT
BENZOIN TINCTURE PRP APPL 2/3 (GAUZE/BANDAGES/DRESSINGS) ×5 IMPLANT
BNDG CMPR 9X6 STRL LF SNTH (GAUZE/BANDAGES/DRESSINGS)
BNDG ESMARK 6X9 LF (GAUZE/BANDAGES/DRESSINGS)
CANISTER SUCTION 2500CC (MISCELLANEOUS) ×3 IMPLANT
CANNULA VESSEL 3MM 2 BLNT TIP (CANNULA) ×6 IMPLANT
CLIP LIGATING EXTRA MED SLVR (CLIP) ×3 IMPLANT
CLIP LIGATING EXTRA SM BLUE (MISCELLANEOUS) ×3 IMPLANT
CLOSURE WOUND 1/2 X4 (GAUZE/BANDAGES/DRESSINGS) ×2
CUFF TOURNIQUET SINGLE 34IN LL (TOURNIQUET CUFF) IMPLANT
CUFF TOURNIQUET SINGLE 44IN (TOURNIQUET CUFF) IMPLANT
DRAIN SNY 10X20 3/4 PERF (WOUND CARE) IMPLANT
DRAPE PROXIMA HALF (DRAPES) IMPLANT
DRAPE X-RAY CASS 24X20 (DRAPES) IMPLANT
DRSG COVADERM 4X10 (GAUZE/BANDAGES/DRESSINGS) IMPLANT
DRSG COVADERM 4X6 (GAUZE/BANDAGES/DRESSINGS) ×4 IMPLANT
DRSG COVADERM 4X8 (GAUZE/BANDAGES/DRESSINGS) ×2 IMPLANT
ELECT REM PT RETURN 9FT ADLT (ELECTROSURGICAL) ×3
ELECTRODE REM PT RTRN 9FT ADLT (ELECTROSURGICAL) ×1 IMPLANT
EVACUATOR SILICONE 100CC (DRAIN) IMPLANT
GLOVE BIO SURGEON STRL SZ 6.5 (GLOVE) ×4 IMPLANT
GLOVE BIO SURGEONS STRL SZ 6.5 (GLOVE) ×4
GLOVE BIOGEL PI IND STRL 6.5 (GLOVE) IMPLANT
GLOVE BIOGEL PI IND STRL 7.0 (GLOVE) IMPLANT
GLOVE BIOGEL PI INDICATOR 6.5 (GLOVE) ×4
GLOVE BIOGEL PI INDICATOR 7.0 (GLOVE) ×2
GLOVE SS BIOGEL STRL SZ 7.5 (GLOVE) ×1 IMPLANT
GLOVE SUPERSENSE BIOGEL SZ 7.5 (GLOVE) ×2
GOWN STRL REUS W/ TWL LRG LVL3 (GOWN DISPOSABLE) ×3 IMPLANT
GOWN STRL REUS W/TWL LRG LVL3 (GOWN DISPOSABLE) ×15
INSERT FOGARTY SM (MISCELLANEOUS) IMPLANT
KIT BASIN OR (CUSTOM PROCEDURE TRAY) ×3 IMPLANT
KIT ROOM TURNOVER OR (KITS) ×3 IMPLANT
NS IRRIG 1000ML POUR BTL (IV SOLUTION) ×6 IMPLANT
PACK PERIPHERAL VASCULAR (CUSTOM PROCEDURE TRAY) ×3 IMPLANT
PAD ARMBOARD 7.5X6 YLW CONV (MISCELLANEOUS) ×6 IMPLANT
PADDING CAST COTTON 6X4 STRL (CAST SUPPLIES) IMPLANT
SET COLLECT BLD 21X3/4 12 (NEEDLE) IMPLANT
STAPLER VISISTAT 35W (STAPLE) IMPLANT
STOPCOCK 4 WAY LG BORE MALE ST (IV SETS) IMPLANT
STRIP CLOSURE SKIN 1/2X4 (GAUZE/BANDAGES/DRESSINGS) ×3 IMPLANT
SUT ETHILON 3 0 PS 1 (SUTURE) IMPLANT
SUT PROLENE 5 0 C 1 24 (SUTURE) ×3 IMPLANT
SUT PROLENE 6 0 CC (SUTURE) ×9 IMPLANT
SUT SILK 2 0 SH (SUTURE) ×3 IMPLANT
SUT VIC AB 2-0 CTX 36 (SUTURE) ×6 IMPLANT
SUT VIC AB 3-0 SH 27 (SUTURE) ×15
SUT VIC AB 3-0 SH 27X BRD (SUTURE) ×2 IMPLANT
TAPE UMBILICAL COTTON 1/8X30 (MISCELLANEOUS) ×2 IMPLANT
TRAY FOLEY W/METER SILVER 16FR (SET/KITS/TRAYS/PACK) ×3 IMPLANT
TUBING EXTENTION W/L.L. (IV SETS) IMPLANT
UNDERPAD 30X30 INCONTINENT (UNDERPADS AND DIAPERS) ×3 IMPLANT
WATER STERILE IRR 1000ML POUR (IV SOLUTION) ×3 IMPLANT

## 2015-08-19 NOTE — Progress Notes (Signed)
  [facility] Day of Surgery Note  Subjective:  Patient seen in PACU. Comfortable.   Filed Vitals:   08/19/15 0924 08/19/15 1402  BP: 115/34   Pulse: 89   Temp: 97.5 F (36.4 C) 98 F (36.7 C)  Resp: 18     Incisions:  Left leg incisions clean and intact. No hematoma.  Extremities:  +Doppler flow posterior tibial, dorsalis pedis and peroneal, ulcerations of left toes. Ulceration of left thigh.    Assessment/Plan:  This is a 70 y.o. female who is s/p bypass from prior distal anastomosis of prior vein left femoral-popliteal above-knee bypass to below-knee popliteal artery with reversed small saphenous vein.   Stable post-op. Bypass patent.  To 3S when bed available.    [name], [place] Pager: [phone] 08/19/2015 2:49 PM

## 2015-08-19 NOTE — Anesthesia Procedure Notes (Signed)
Procedure Name: Intubation Date/Time: 08/19/2015 10:32 AM Performed by: Gavin Pound, Jaemarie Hochberg J Pre-anesthesia Checklist: Patient identified, Emergency Drugs available, Suction available, Patient being monitored and Timeout performed Patient Re-evaluated:Patient Re-evaluated prior to inductionOxygen Delivery Method: Circle system utilized Preoxygenation: Pre-oxygenation with 100% oxygen Intubation Type: IV induction Ventilation: Mask ventilation without difficulty Laryngoscope Size: Mac and 3 Grade View: Grade I Tube type: Oral Tube size: 7.5 mm Number of attempts: 1 Placement Confirmation: ETT inserted through vocal cords under direct vision,  positive ETCO2 and breath sounds checked- equal and bilateral Secured at: 21 cm Tube secured with: Tape Dental Injury: Teeth and Oropharynx as per pre-operative assessment

## 2015-08-19 NOTE — H&P (View-Only) (Signed)
Patient name: [name] MRN: [id number] DOB: 12-11-1945 Sex: female  REASON FOR VISIT:  Discussion of left toe ulcerations  HPI: [name] is a 70 y.o. female  Here today for continued follow-up of ulcerations of her toes of her left foot. She is here today with her husband. He is having to make some very difficult financial decisions regarding selling of beach home versus home in [place] and also potentially moving into [employer]. I unfortunately had very little knowledge regarding the regulations around this. He has discussed this as well with Dr. Forde Dandy.  Current Outpatient Prescriptions  Medication Sig Dispense Refill  . ALPRAZolam (XANAX) 0.25 MG tablet Take 0.25 mg by mouth 2 (two) times daily.    Marland Kitchen amoxicillin (AMOXIL) 875 MG tablet Take 875 mg by mouth.    Marland Kitchen aspirin EC 81 MG tablet Take 1 tablet (81 mg total) by mouth daily.    . collagenase (SANTYL) ointment Apply 1 application topically daily.    Marland Kitchen dexlansoprazole (DEXILANT) 60 MG capsule Take 60 mg by mouth 2 (two) times daily.    . ergocalciferol (VITAMIN D2) 50000 units capsule Take 50,000 Units by mouth 3 (three) times a week.    . furosemide (LASIX) 40 MG tablet Take 1 tablet (40 mg total) by mouth daily. 30 tablet 1  . Insulin Degludec (TRESIBA FLEXTOUCH) 100 UNIT/ML SOPN Inject 16 Units into the skin daily.    . iron polysaccharides (NIFEREX) 150 MG capsule Take three (3) capsules (450 mg total) by mouth every morning and take four (4) capsules (600 mg total) by mouth every evening.    . isosorbide mononitrate (IMDUR) 30 MG 24 hr tablet Take 30 mg by mouth daily.    . meperidine (DEMEROL) 50 MG tablet Take 1 tablet (50 mg total) by mouth every 4 (four) hours as needed for severe pain. 50 tablet 0  . metoprolol succinate (TOPROL-XL) 50 MG 24 hr tablet Take 50 mg by mouth 2 (two) times daily. Take with or immediately following a meal.    . nitroGLYCERIN (NITROSTAT) 0.4 MG SL tablet Place 1 tablet  (0.4 mg total) under the tongue every 5 (five) minutes as needed. For chest pain 25 tablet 2  . omega-3 acid ethyl esters (LOVAZA) 1 G capsule Take 2 g by mouth 2 (two) times daily.     . pioglitazone-metformin (ACTOPLUS MET) 15-850 MG per tablet Take 1 tablet by mouth daily. Brand name only-does not work if components are given separately per patient 15-850 MG    . potassium chloride (K-DUR) 10 MEQ tablet Take 1 tablet (10 mEq total) by mouth 2 (two) times daily. 60 tablet 1  . Probiotic Product (ACIDOPHILUS PEARLS) CAPS Take 1 capsule by mouth daily.    . prochlorperazine (COMPAZINE) 10 MG tablet Take 1 tablet (10 mg total) by mouth every 6 (six) hours as needed. For nausea 30 tablet 2  . ramipril (ALTACE) 1.25 MG capsule Take 1.25 mg by mouth daily. BRAND NAME ONLY    . rosuvastatin (CRESTOR) 10 MG tablet Take 10 mg by mouth once a week.    . sitaGLIPtan (JANUVIA) 100 MG tablet Take 100 mg by mouth at bedtime.     . sodium chloride (MURO 128) 5 % ophthalmic solution Place 1 drop into both eyes daily as needed. (lubrication)    . sulfamethoxazole-trimethoprim (BACTRIM DS,SEPTRA DS) 800-160 MG tablet Take 1 tablet by mouth daily.    Marland Kitchen ZETIA 10 MG tablet TAKE 1 TABLET (10 MG TOTAL)  BY MOUTH DAILY. 90 tablet 0  . cephALEXin (KEFLEX) 500 MG capsule Take 1 capsule (500 mg total) by mouth 4 (four) times daily. (Patient not taking: Reported on 07/28/2015) 42 capsule 0   No current facility-administered medications for this visit.    REVIEW OF SYSTEMS:  _0  denotes positive finding, _1  denotes negative finding Cardiac  Comments:  Chest pain or chest pressure:    Shortness of breath upon exertion:    Short of breath when lying flat:    Irregular heart rhythm:    Constitutional    Fever or chills:      PHYSICAL EXAM: Filed Vitals:   07/28/15 1347 07/28/15 1350  BP: 107/67 97/66  Pulse: 90   Height: 5' 4" (1.626 m)   Weight: 157 lb (71.215 kg)   SpO2: 94%     GENERAL: The patient is a  well-nourished female, in no acute distress. The vital signs are documented above. PULMONARY: There is good air exchange   no real change in her left foot. She does have full-thickness ulceration over the medial aspect of her second toe and lateral aspect of her third toe also has some superficial blistering on the dorsum of the same areas.  MEDICAL ISSUES:  I do not feel that she has adequate flow to heal her toes. With her severe microvascular disease , she is at significant risk for limb loss even with redo bypass. 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 She had reconstitution of a diseased below-knee popliteal with single vessel runoff via diseased peroneal artery. I explained that she would have better flow with a above-knee to below-knee jump graft around this popliteal occlusion whether this would provide adequate flow for healing is unknown. She wishes to proceed with surgery will let [name] know when she wishes to do this. We will continue discussion regarding there options for rehabilitation and living arrangements. I will see her in one week for continued discussion and also for of vein mapping to determine what vein is available for jump graft  [name] [facility] [employer]: [phone]

## 2015-08-19 NOTE — Progress Notes (Signed)
Attempting to get pediatric BP cuff from ED for further evaluation of BP.

## 2015-08-19 NOTE — OR Nursing (Signed)
Patient stated betadine was okay to use.

## 2015-08-19 NOTE — Progress Notes (Signed)
Despite using a small cuff on the right forearm and repositioning the cuff multiple times, the automated BP is not able to obtain a BP.  The patient's left arm cannot be used, nor can the right upper arm (due to PICC line).  MD paged to notify of situation.

## 2015-08-19 NOTE — Transfer of Care (Signed)
Immediate Anesthesia Transfer of Care Note  Patient: [name]  Procedure(s) Performed: Procedure(s): LEFT FEMORAL-  ABOVE KNEE POPLITEAL BYPASS GRAFT (VEIN) to BELOW KNEE POPLITEAL ARTERY BYPASS WITH SMALL SAPHENOUS VEIN GRAFT (Left)  Patient Location: PACU  Anesthesia Type:General  Level of Consciousness: awake  Airway & Oxygen Therapy: Patient Spontanous Breathing and Patient connected to nasal cannula oxygen  Post-op Assessment: Report given to RN and Post -op Vital signs reviewed and stable  Post vital signs: Reviewed and stable  Last Vitals:  Filed Vitals:   08/19/15 0924  BP: 115/34  Pulse: 89  Temp: 36.4 C  Resp: 18    Complications: No apparent anesthesia complications

## 2015-08-19 NOTE — Progress Notes (Signed)
Repeat cbg 92.

## 2015-08-19 NOTE — Progress Notes (Signed)
CBG 52.  One container of apple juice provided.  Will recheck.

## 2015-08-19 NOTE — Anesthesia Preprocedure Evaluation (Signed)
Anesthesia Evaluation  Patient identified by MRN, date of birth, ID band Patient awake    Reviewed: Allergy & Precautions, NPO status , Patient's Chart, lab work & pertinent test results  History of Anesthesia Complications (+) PONV  Airway Mallampati: II   Neck ROM: full    Dental   Pulmonary former smoker,    breath sounds clear to auscultation       Cardiovascular hypertension, + CAD, + Past MI, + Cardiac Stents and + Peripheral Vascular Disease   Rhythm:regular Rate:Normal     Neuro/Psych Anxiety    GI/Hepatic GERD  ,  Endo/Other  diabetes, Type 2  Renal/GU      Musculoskeletal   Abdominal   Peds  Hematology   Anesthesia Other Findings   Reproductive/Obstetrics                             Anesthesia Physical Anesthesia Plan  ASA: III  Anesthesia Plan: General   Post-op Pain Management:    Induction: Intravenous  Airway Management Planned: Oral ETT  Additional Equipment:   Intra-op Plan:   Post-operative Plan: Extubation in OR  Informed Consent: I have reviewed the patients History and Physical, chart, labs and discussed the procedure including the risks, benefits and alternatives for the proposed anesthesia with the patient or authorized representative who has indicated his/her understanding and acceptance.     Plan Discussed with: CRNA, Anesthesiologist and Surgeon  Anesthesia Plan Comments:         Anesthesia Quick Evaluation

## 2015-08-19 NOTE — Anesthesia Postprocedure Evaluation (Signed)
Anesthesia Post Note  Patient: [name]  Procedure(s) Performed: Procedure(s) (LRB): LEFT FEMORAL-  ABOVE KNEE POPLITEAL BYPASS GRAFT (VEIN) to BELOW KNEE POPLITEAL ARTERY BYPASS WITH SMALL SAPHENOUS VEIN GRAFT (Left)  Patient location during evaluation: PACU Anesthesia Type: General Level of consciousness: awake and alert and patient cooperative Pain management: pain level controlled Vital Signs Assessment: post-procedure vital signs reviewed and stable Respiratory status: spontaneous breathing and respiratory function stable Cardiovascular status: stable Anesthetic complications: no    Last Vitals:  Filed Vitals:   08/19/15 0924 08/19/15 1402  BP: 115/34   Pulse: 89   Temp: 36.4 C 36.7 C  Resp: 18     Last Pain:  Filed Vitals:   08/19/15 1434  PainSc: 0-No pain                 Romualdo Prosise S

## 2015-08-19 NOTE — Op Note (Signed)
OPERATIVE REPORT  DATE OF SURGERY: 08/19/2015  PATIENT: [name], 70 y.o. female MRN: [id number]  DOB: 09/09/1945  PRE-OPERATIVE DIAGNOSIS: Nonhealing wounds on his left foot with critical limb ischemia  POST-OPERATIVE DIAGNOSIS:  Same  PROCEDURE: Bypass from prior left distal anastomosis of the prior vein left femoropopliteal above-knee bypass the below-knee popliteal artery with reversed small saphenous vein  SURGEON:  Gretta Began, M.D.  PHYSICIAN ASSISTANT: Samantha Rhyne PA-C  ANESTHESIA:  Gen.  EBL: Less than 100 ml  Total I/O In: -  Out: 450 [Urine:250; Blood:200]  BLOOD ADMINISTERED: None  DRAINS: None  SPECIMEN: None  COUNTS CORRECT:  YES  PLAN OF CARE: PACU   PATIENT DISPOSITION:  PACU - hemodynamically stable  PROCEDURE DETAILS: Patient was taken to the operative placed supine position where the area of the left groin left leg prepped draped in sterile fashion. SonoSite ultrasound was used to visualize the small saphenous vein. Preoperative mapping showed that this was small but was patent proximal calf. Incision was made over the small saphenous vein segment isolate the vein. Tributary branches were ligated with 4-0 silk ties and divided. The vein was harvested from the popliteal space to the mid to distal calf. The vein became small distal calf is not was ligated distally and divided. The vein was ligated at the popliteal space. The vein was gently dilated distal portion is not adequate size. The remaining portion of the vein was adequate. Next a separate incision was made through prior incision and the above-knee popliteal space. The patient had a left femoral to above-knee popliteal bypass with saphenous vein in 2009. This vein graft was widely patent. The hood of the graft was identified as was the popliteal artery just below this. The popliteal artery below this is extremely calcified. The upper incision was made medial approach to the below-knee  popliteal artery. The gastrocnemius muscle was reflected posteriorly. The artery was exposed and the below-knee position behind the knee. This was adequate for bypass with mild calcification. A tunnel was created from the below-knee popliteal to the above-knee popliteal artery. The patient was given 7015 separate after circulation time the vein graft from the old femoropopliteal was occluded and the had distal popliteal artery was occluded with the vessel loop. The hood of the old vein graft was opened with 11 blade some ulcerative with Potts scissors. The small saphenous vein graft was brought onto the field. This was divided where it became too small for use and was spatulated. The vein was reversed and was sewn end-to-side to the junction of the old hood of the vein graft with a running 6-0 Prolene suture. Anastomosis was tested and found to be adequate. There was excellent flow through the vein graft. This was then brought to the prior created tunnel down to the below-knee popliteal artery. The vein and then marked to prevent twisting. The web roll was placed around the thigh and the pneumatic Tourniquet was placed around the thigh. The foot was elevated and exsanguinated with an Esmarch tourniquet and the tourniquet was inflated to 250 mmHg. The below-knee popliteal artery was opened with an 11 blade incision longitudinally with Potts scissors. The small saphenous vein graft was divided at the appropriate length and was spatulated. This was sewn end-to-side to the artery with a running 6-0 Prolene suture. Prior to completion of the anastomosis 3 dilator passed through the distal anastomosis without difficulty. The usual flushing maneuvers were undertaken by deflating the tourniquet. The anastomosis was completed. There was  excellent graft dependent Doppler flow in the popliteal artery. The patient was given 50 mg of protamine to reverse heparin. Wounds irrigated with saline. Hemostasis tablet cautery. Wounds  were closed with 2-0 Vicryl in the fascia in the above-knee and below-knee popliteal wounds. The skin was closed with 3-0 subcuticular Vicryl suture. The small saphenous vein harvest was closed with 3-0 Vicryl in the subcutaneous tissue and also 3-0 Vicryl in subcuticular tissue. Benzoin and Steri-Strips and sterile dressing was applied. The patient was transferred to the recovery room in stable condition

## 2015-08-19 NOTE — Interval H&P Note (Signed)
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 After consideration of risks, benefits and other options for treatment, the patient has consented to  Procedure(s): BYPASS GRAFT FEMORAL-TIBIAL ARTERY-LEFT (Left) as a surgical intervention .  The patient's history has been reviewed, patient examined, no change in status, stable for surgery.  I have reviewed the patient's chart and labs.  Questions were answered to the patient's satisfaction.     Gretta Began

## 2015-08-20 ENCOUNTER — Encounter (HOSPITAL_COMMUNITY): Payer: Self-pay | Admitting: Vascular Surgery

## 2015-08-20 DIAGNOSIS — L8990 Pressure ulcer of unspecified site, unspecified stage: Secondary | ICD-10-CM | POA: Insufficient documentation

## 2015-08-20 LAB — GLUCOSE, CAPILLARY
GLUCOSE-CAPILLARY: 54 mg/dL — AB (ref 65–99)
GLUCOSE-CAPILLARY: 100 mg/dL — AB (ref 65–99)
GLUCOSE-CAPILLARY: 112 mg/dL — AB (ref 65–99)
GLUCOSE-CAPILLARY: 120 mg/dL — AB (ref 65–99)
Glucose-Capillary: 69 mg/dL (ref 65–99)
Glucose-Capillary: 161 mg/dL — ABNORMAL HIGH (ref 65–99)

## 2015-08-20 LAB — HEMOGLOBIN A1C
Hgb A1c MFr Bld: 6.6 % — ABNORMAL HIGH (ref 4.8–5.6)
Mean Plasma Glucose: 143 mg/dL

## 2015-08-20 LAB — CBC
HEMATOCRIT: 25.7 % — AB (ref 36.0–46.0)
HEMOGLOBIN: 8 g/dL — AB (ref 12.0–15.0)
MCH: 27.7 pg (ref 26.0–34.0)
MCHC: 31.1 g/dL (ref 30.0–36.0)
MCV: 88.9 fL (ref 78.0–100.0)
Platelets: 292 10*3/uL (ref 150–400)
RBC: 2.89 MIL/uL — AB (ref 3.87–5.11)
RDW: 14.9 % (ref 11.5–15.5)
WBC: 6.3 10*3/uL (ref 4.0–10.5)

## 2015-08-20 LAB — BASIC METABOLIC PANEL
ANION GAP: 7 (ref 5–15)
BUN: 10 mg/dL (ref 6–20)
CHLORIDE: 106 mmol/L (ref 101–111)
CO2: 27 mmol/L (ref 22–32)
CREATININE: 0.94 mg/dL (ref 0.44–1.00)
Calcium: 8.4 mg/dL — ABNORMAL LOW (ref 8.9–10.3)
GFR calc non Af Amer: >60 mL/min (ref >60)
Glucose, Bld: 94 mg/dL (ref 65–99)
POTASSIUM: 4.4 mmol/L (ref 3.5–5.1)
Sodium: 140 mmol/L (ref 135–145)

## 2015-08-20 MED ORDER — ENOXAPARIN SODIUM 40 MG/0.4ML ~~LOC~~ SOLN: 40.0000 mg | SUBCUTANEOUS | Status: DC

## 2015-08-20 MED ORDER — DIPHENHYDRAMINE HCL 25 MG PO CAPS: 25.0000 mg | ORAL_CAPSULE | Freq: Three times a day (TID) | ORAL | Status: DC | PRN

## 2015-08-20 MED ORDER — CETYLPYRIDINIUM CHLORIDE 0.05 % MT LIQD
7.0000 mL | Freq: Two times a day (BID) | OROMUCOSAL | Status: DC
  Administered 2015-08-20 – 2015-08-21 (×2): 7 mL via OROMUCOSAL

## 2015-08-20 NOTE — Consult Note (Addendum)
WOC wound consult note VVS following for assessment and plan of care to left foot.  Requested to assess left thigh wounds, which were the result of an accident according to the EMR, NOT pressure injuries. Wound type: Left upper thigh with 3 full thickness wounds; 1X1.2cm, .3X.3cm, 1X1cm  Wound bed: All 3 locations are 100% tightly adhered slough/eschar Drainage (amount, consistency, odor) Scant amt yellow drainage, no odor Periwound: Intact skin surrounding Dressing procedure/placement/frequency: Melburn Popper has already been ordered for chemical debridement of nonviable tissue.  Agree with this plan of care and discussed with patient. Please re-consult if further assistance is needed.  Thank-you,  Cammie Mcgee MSN, RN, CWOCN, [place], CNS [phone]

## 2015-08-20 NOTE — Progress Notes (Signed)
eLink Pharmacist-Brief Progress Note Patient Name: [name] DOB: May 23, 1946 MRN: [id number]   Date of Service  08/20/2015  HPI/Events of Note  Meds reviewed. Pt on DVT prophylaxis. CrCl > 30, wt > 45kg  eICU Interventions  Inc Lovenox to 40 mg q24h   eLink Provider: Betha Loa, PharmD 08/20/2015, 3:46 PM

## 2015-08-20 NOTE — Progress Notes (Signed)
Inpatient Rehabilitation  Patient was screened by Mathan Darroch for appropriateness for an Inpatient Acute Rehab consult.  At this time we are recommending an Inpatient Rehab consult.  Please order if you are agreeable.    Normalee Sistare, M.A., CCC/SLP Admission Coordinator   Inpatient Rehabilitation  Cell [phone]  

## 2015-08-20 NOTE — Evaluation (Signed)
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  Pt with h/o poor circulation to feet.   Clinical Impression   Pt seen for the above diagnosis and has the deficits listed below. Pt would benefit from cont OT to increase independence with basic adls so she can eventually d/c home with her husband.  Husband is around 24/7 but they do not seem to share the same long term or short term goals which may make rehab an even better idea.  Will continue to see acutely.    Follow Up Recommendations  CIR;Supervision/Assistance - 24 hour    Equipment Recommendations  None recommended by OT    Recommendations for Other Services       Precautions / Restrictions Precautions Precautions: Fall Restrictions Weight Bearing Restrictions: No      Mobility Bed Mobility Overal bed mobility: Needs Assistance Bed Mobility: Supine to Sit     Supine to sit: Mod assist;HOB elevated     General bed mobility comments: Pt uses bedrails and grabs to therapist but when instructed to use arms did well.  Transfers Overall transfer level: Needs assistance Equipment used: Rolling walker (2 wheeled);1 person hand held assist Transfers: Sit to/from [employer] Sit to Stand: Mod assist Stand pivot transfers: Max assist       General transfer comment: Pt used walker on first transfer and did well although very anxious and grabbing for therapist.  Once instructed on where to put hands on walker, pt needed less assist.    Balance Overall balance assessment: Needs assistance Sitting-balance support: Feet supported;Bilateral upper extremity supported (single foot supported) Sitting balance-Leahy Scale: Fair Sitting balance - Comments: Pt with some mild LOB  sitting but once on EOB and sitting straight, did better.   Standing balance support: Bilateral upper extremity supported;During functional activity Standing balance-Leahy Scale: Zero Standing balance comment: pt must have outside support to remain standing.                            ADL Overall ADL's : Needs assistance/impaired Eating/Feeding: Independent   Grooming: Set up;Sitting   Upper Body Bathing: Set up;Sitting   Lower Body Bathing: Maximal assistance;Sit to/from stand   Upper Body Dressing : Set up;Sitting   Lower Body Dressing: Maximal assistance;Sit to/from stand   Toilet Transfer: Moderate assistance;Stand-pivot;RW Toilet Transfer Details (indicate cue type and reason): pivot away from amputated leg to commode with walker.  Anxious but once calmed does well. Toileting- Clothing Manipulation and Hygiene: Moderate assistance;Sit to/from stand Toileting - Clothing Manipulation Details (indicate cue type and reason): pt cleaned self in sitting but requires assist to manage pants.     Functional mobility during ADLs: Maximal assistance;Rolling walker General ADL Comments: Pt self limits and husband is very inconsistent in encouraging pt to do more and then when she does more being angry she did too much.  Husband appears overall angry. Will let charge nurse know.  Pt will require a great amount of assist if she goes straight  home from the husband therefore feel SNF rehab may be the way to go to optiomize her independence.     Vision     Perception  Praxis      Pertinent Vitals/Pain Pain Assessment: Faces Faces Pain Scale: Hurts even more Pain Location: R leg Pain Descriptors / Indicators: Aching;Grimacing;Tightness Pain Intervention(s): Limited activity within patient's tolerance;Monitored during session;Repositioned     Hand Dominance Right   Extremity/Trunk Assessment Upper Extremity Assessment Upper Extremity Assessment: RUE  deficits/detail RUE Deficits / Details: Limited ROM in R shoulder. RUE: Unable to fully assess due to pain   Lower Extremity Assessment Lower Extremity Assessment: Defer to PT evaluation   Cervical / Trunk Assessment Cervical / Trunk Assessment: Normal   Communication Communication Communication: No difficulties   Cognition Arousal/Alertness: Awake/alert Behavior During Therapy: Anxious Overall Cognitive Status: Within Functional Limits for tasks assessed                     General Comments       Exercises       Shoulder Instructions      Home Living Family/patient expects to be discharged to:: Private residence Living Arrangements: Spouse/significant other Available Help at Discharge: Family;Available 24 hours/day Type of Home: House Home Access: Stairs to enter [employer] of Steps: 2 Entrance Stairs-Rails: Right;Left;Can reach both Home Layout: One level     Bathroom Shower/Tub: Walk-in shower;Door   [employer] Toilet: Standard     Home Equipment: Shower seat;Bedside commode;Walker - 2 wheels;Wheelchair - manual   Additional Comments: Pt does not have goals to do much walking although husband does.        Prior Functioning/Environment Level of Independence: Needs assistance  Gait / Transfers Assistance Needed: Did not do much walking.  Only with walker and prosthetic from garage steps to car with help from husband.  Otherwise was pivot transfers only which she sometimes had help with and sometimes would not.   ADL's / Homemaking Assistance Needed: pt husband does most cleaning and cooking but pt can care for self other than transferring to shower.        OT Diagnosis: Generalized weakness;Acute pain   OT Problem List: Decreased strength;Decreased activity tolerance;Impaired balance (sitting and/or standing);Decreased knowledge of use of DME or AE;Pain   OT Treatment/Interventions: Self-care/ADL training;DME and/or AE  instruction;Therapeutic activities    OT Goals(Current goals can be found in the care plan section) Acute Rehab OT Goals Patient Stated Goal: to be back at level I was at before.  Pt does not have goals to walk although husband does. OT Goal Formulation: With patient/family Time For Goal Achievement: 09/03/15 Potential to Achieve Goals: Good ADL Goals Pt Will Perform Lower Body Bathing: with min assist;sit to/from stand Pt Will Perform Lower Body Dressing: with mod assist;sit to/from stand Pt Will Transfer to Toilet: with min assist;bedside commode Pt Will Perform Tub/Shower Transfer: Squat pivot transfer;rolling walker;with min guard assist  OT Frequency: Min 2X/week   Barriers to D/C: Decreased caregiver support  only husband available to assist and they don't seem to be agreeing on d/c plans.       Co-evaluation              End of Session Equipment Utilized During Treatment: Oxygen Nurse Communication: Mobility status;Other (comment) (bleeding from BK incision site.)  Activity Tolerance: Patient tolerated treatment well Patient left: in chair;with call bell/phone within reach;with family/visitor present   Time: 4098-1191 OT Time Calculation (min): 55 min Charges:  OT General Charges $OT Visit: 1 Procedure OT Evaluation $OT Eval Moderate Complexity: 1 Procedure OT Treatments $Self Care/Home Management : 23-37 mins G-Codes:    Hope Budds  08/20/2015, 2:00 PM  [phone]

## 2015-08-20 NOTE — Care Management Note (Signed)
Case Management Note  Patient Details  Name: [name] MRN: [id number] Date of Birth: [birth year]  Subjective/Objective:     Date: Spoke with patient at the bedside along with spouse.  Introduced self as Sports coach and explained role in discharge planning and how to be reached.  Verified patient lives in town,  with spouse. Has rolling walker and a wheelchair at home. Expressed potential need for no other DME.  Verified patient anticipates to go home with spouse at time of discharge and will have full-time supervision by family at this time to best of their knowledge. Patient denied needing help with their medication.  Patient  is driven by spouse to MD appointments.  Verified patient has PCP Adrian Prince. Per Spouse, if patient needs any HHPT services she can get this from [facility]  Await Pt/ot eval.   Plan: CM will continue to follow for discharge planning and [facility] resources.                Action/Plan:   Expected Discharge Date:                  Expected Discharge Plan:  Home w Home Health Services  In-House Referral:     Discharge planning Services  CM Consult  Post Acute Care Choice:    Choice offered to:     DME Arranged:    DME Agency:     HH Arranged:    HH Agency:     Status of Service:  In process, will continue to follow  Medicare Important Message Given:    Date Medicare IM Given:    Medicare IM give by:    Date Additional Medicare IM Given:    Additional Medicare Important Message give by:     If discussed at Long Length of Stay Meetings, dates discussed:    Additional Comments:  Leone Haven, RN 08/20/2015, 12:22 PM

## 2015-08-20 NOTE — Progress Notes (Signed)
Inpatient Diabetes Program Recommendations  AACE/ADA: New Consensus Statement on Inpatient Glycemic Control (2015)  Target Ranges:  Prepandial:   less than 140 mg/dL      Peak postprandial:   less than 180 mg/dL (1-2 hours)      Critically ill patients:  140 - 180 mg/dL   Review of Glycemic Control:  Results for RUCHY, UMBACH (MRN [id number]) as of 08/20/2015 10:35  Ref. Range 08/19/2015 17:22 08/19/2015 21:09 08/20/2015 07:59 08/20/2015 08:00 08/20/2015 08:35  Glucose-Capillary Latest Ref Range: 65-99 mg/dL 92 607 (H) 69 54 (L) 371 (H)   Diabetes history: Type 2 diabetes Outpatient Diabetes medications: Tresiba 16 units daily, Actoplus/Metformin one tab daily, Januvia 100 mg daily Current orders for Inpatient glycemic control:  Novolog moderate tid with meals  Inpatient Diabetes Program Recommendations:   Note low blood sugars inpatient.  Patient is on [place] 16 units daily at home.  According to documentation, patient received Tresiba 8 units yesterday morning per instructions.  Note that Evaristo Bury can last up to 48 hours duration.  Agree with current orders.  Would not reorder basal or oral agents at this time.  Will follow  Thanks, Beryl Meager, RN, BC-ADM Inpatient Diabetes Coordinator Pager [phone] (8a-5p)

## 2015-08-20 NOTE — Evaluation (Signed)
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  Pt with h/o poor circulation to feet.  Clinical Impression  Patient is s/p above surgery resulting in functional limitations due to the deficits listed below (see PT Problem List). [name] reports she would like to be able to "walk 3 doors down" while in hospital.  She currently requires +2 mod assist for sit<>stand and practiced weight shifting at bedside.  Feel pt will benefit from CIR to reach min assist level. Patient will benefit from skilled PT to increase their independence and safety with mobility to allow discharge to the venue listed below.      Follow Up Recommendations CIR    Equipment Recommendations  Other (comment) (TBD)    Recommendations for Other Services Rehab consult     Precautions / Restrictions Precautions Precautions: Fall Restrictions Weight Bearing Restrictions: No      Mobility  Bed Mobility Overal bed mobility: Needs Assistance Bed Mobility: Supine to Sit     Supine to sit: HOB elevated;Min assist;+2 for physical assistance     General bed mobility comments: Cues for technique and assist to boost up to sitting.  Cues to scoot to EOB w/ assist using bed pad, pt w/ strong posterior lean which appears to be a result of anxiety or falling forward.   Transfers Overall transfer level: Needs assistance Equipment used: Rolling walker (2 wheeled) Transfers: Sit to/from Stand Sit to Stand: Mod assist;+2 physical assistance Stand pivot transfers: Max assist       General transfer comment: Elevated bed to make it easier to prosthesis to click into place once standing.  Cues for hand placement to push up from bed and to stand upright once standing.  Pt w/ flexed posture and  very anxious about returning to bed.  Required max encouragement to perform sit<>stand x2 and to remain standing for 45 seconds on the second attempt.  Performed weight shifts in standing.  Ambulation/Gait             General Gait Details: deferred for pt/therapist safety  Stairs            Wheelchair Mobility    Modified Rankin (Stroke Patients Only)       Balance Overall balance assessment: Needs assistance Sitting-balance support: Bilateral upper extremity supported;Feet supported Sitting balance-Leahy Scale: Fair Sitting balance - Comments: Posterior lean which appears to be due to anxiety about falling forward.  Requires min>mod assist for ~10 minutes, demonstrating ability to sit Independently for ~3 minutes before becoming anxious again. Postural control: Posterior lean Standing balance support: Bilateral upper extremity supported;During functional activity Standing balance-Leahy Scale: Poor Standing balance comment: When pt not attempting to sit down she requires +2 min assist to remain stable standing EOB                             Pertinent Vitals/Pain Pain Assessment: Faces Faces Pain Scale: Hurts even more Pain Location: Lt LE Pain Descriptors / Indicators: Tightness;Sore;Grimacing;Moaning Pain Intervention(s): Limited activity within patient's tolerance;Monitored during session;Repositioned;Premedicated before session    Home Living Family/patient expects to be discharged to:: Private residence Living Arrangements: Spouse/significant other Available Help at Discharge: Family;Available 24 hours/day Type of  Home: House Home Access: Stairs to enter Entrance Stairs-Rails: Right;Left;Can reach both Entrance Stairs-Number of Steps: 2 Home Layout: One level Home Equipment: Shower seat;Bedside commode;Walker - 2 wheels;Wheelchair - manual Additional Comments: Per OT note pt and [name] seem to have different opinions and goals    Prior  Function Level of Independence: Needs assistance   Gait / Transfers Assistance Needed: Did not do much walking.  Only with walker and prosthetic from garage steps to car with help from [name].  Otherwise was pivot transfers only which she sometimes had help with and sometimes would not.    ADL's / Homemaking Assistance Needed: pt [name] does most cleaning and cooking but pt can care for self other than transferring to shower.        Hand Dominance   Dominant Hand: Right    Extremity/Trunk Assessment   Upper Extremity Assessment: Defer to OT evaluation RUE Deficits / Details: Limited ROM in R shoulder. RUE: Unable to fully assess due to pain       Lower Extremity Assessment: LLE deficits/detail;RLE deficits/detail RLE Deficits / Details: h/o Rt BKA which seems to be swollen, suggested to pt to have [name] retrieve shrinker from home and to wear it while in the hospital LLE Deficits / Details: limited ROM and strength due to surgery listed above  Cervical / Trunk Assessment: Normal  Communication   Communication: No difficulties  Cognition Arousal/Alertness: Awake/alert Behavior During Therapy: Anxious Overall Cognitive Status: Within Functional Limits for tasks assessed                      General Comments General comments (skin integrity, edema, etc.): [name] very anxious and concerned about d/c planning and was reassured that SW/CM will be working w/ pt and [name] for d/c planning.  He agreed to leave room for PT session and for pt quiet time to follow.  SpO2 down to mid 80's nonsustained before returning to 90's at times while sitting EOB.  Assisted pt w/ donning/doffing Rt LE prosthesis.  With all components, pt unable to fit into prosthesis, therefore utilized sleeve and 1 stock only.    Exercises General Exercises - Lower Extremity Ankle Circles/Pumps: AROM;AAROM;Left;10 reps;Supine;Limitations Ankle Circles/Pumps Limitations: PROM to achieve DF to neutral,  otherwise limited to ~ (-)10 deg Quad Sets: Strengthening;Both;10 reps;Supine      Assessment/Plan    PT Assessment Patient needs continued PT services  PT Diagnosis Difficulty walking;Acute pain;Generalized weakness   PT Problem List Decreased strength;Decreased range of motion;Decreased activity tolerance;Decreased balance;Decreased mobility;Decreased knowledge of use of DME;Decreased safety awareness;Decreased knowledge of precautions;Cardiopulmonary status limiting activity;Pain  PT Treatment Interventions DME instruction;Gait training;Stair training;Functional mobility training;Therapeutic exercise;Therapeutic activities;Balance training;Patient/family education;Wheelchair mobility training   PT Goals (Current goals can be found in the Care Plan section) Acute Rehab PT Goals Patient Stated Goal: Pt reports her goal is to be able to walk "3 doors down" while in the hospital PT Goal Formulation: With patient Time For Goal Achievement: 09/04/15 Potential to Achieve Goals: Fair    Frequency Min 3X/week   Barriers to discharge Inaccessible home environment steps to enter home    Co-evaluation               End of Session Equipment Utilized During Treatment: Gait belt;Oxygen;Other (comment) (Rt LE prosthesis) Activity Tolerance: Other (comment);Patient limited by pain (limited by anxiety) Patient left: in bed;with call bell/phone within reach;with bed alarm set Nurse Communication: Mobility status  Time: 1610-9604 PT Time Calculation (min) (ACUTE ONLY): 38 min   Charges:   PT Evaluation $PT Eval Moderate Complexity: 1 Procedure PT Treatments $Therapeutic Activity: 23-37 mins   PT G Codes:       Michail Jewels PT, DPT [phone] Pager: [phone] 08/20/2015, 3:35 PM

## 2015-08-20 NOTE — Progress Notes (Signed)
Pt is refusing to let me take her foley out at this time, says she will let it come out at 0900 or 1000, I expresed the risk of infection but still refused will continue to monitor

## 2015-08-20 NOTE — Progress Notes (Addendum)
  Progress Note    08/20/2015 7:35 AM 1 Day Post-Op  Subjective:  C/o itching  Afebrile HR  80's-100 NSR 70's-130's systolic 97% 2LO2NC  Filed Vitals:   08/20/15 0452 08/20/15 0635  BP: 123/50   Pulse: 96 103  Temp: 98.3 F (36.8 C)   Resp: 22 13    Physical Exam: Cardiac:  Regular Lungs:  Non labored Incisions:  Medial incisions are clean and dry with steri strips in tact.  Posterior bandage left in place (will remove tomorrow) Extremities:  2nd/3rd left toes are cyanotic; +doppler signal left DP/PT/peroneal   CBC    Component Value Date/Time   WBC 6.3 08/20/2015 0500   RBC 2.89* 08/20/2015 0500   HGB 8.0* 08/20/2015 0500   HCT 25.7* 08/20/2015 0500   PLT 292 08/20/2015 0500   MCV 88.9 08/20/2015 0500   MCH 27.7 08/20/2015 0500   MCHC 31.1 08/20/2015 0500   RDW 14.9 08/20/2015 0500   LYMPHSABS 1.9 08/18/2014 2110   MONOABS 0.5 08/18/2014 2110   EOSABS 0.1 08/18/2014 2110   BASOSABS 0.0 08/18/2014 2110    BMET    Component Value Date/Time   NA 140 08/20/2015 0500   K 4.4 08/20/2015 0500   CL 106 08/20/2015 0500   CO2 27 08/20/2015 0500   GLUCOSE 94 08/20/2015 0500   BUN 10 08/20/2015 0500   CREATININE 0.94 08/20/2015 0500   CALCIUM 8.4* 08/20/2015 0500   GFRNONAA >60 08/20/2015 0500   GFRAA >60 08/20/2015 0500    INR    Component Value Date/Time   INR 1.07 08/19/2015 0928     Intake/Output Summary (Last 24 hours) at 08/20/15 0735 Last data filed at 08/20/15 0600  Gross per 24 hour  Intake 2651.25 ml  Output   2200 ml  Net 451.25 ml     Assessment:  70 y.o. female is s/p:  Bypass from prior left distal anastomosis of the prior vein left femoropopliteal above-knee bypass the below-knee popliteal artery with reversed small saphenous vein  1 Day Post-Op  Plan: -pt doing well this morning with +doppler flow left AT/PT/peroneal -medial leg incisions are c/d/i-will remove posterior bandage tomorrow. -hypoglycemic this am-all DM  medications on hold and DM coordinator has been consulted.   -acute blood loss anemia-tolerating at this time -check labs in am -d/c foley this morning  -OOB to chair -keep in stepdown today -pt c/o itching all over-will order Benadryl -wounds on left thigh (received from the brake on wheelchair when transferring)-will ask WOC to evaluate and give recommendations -DVT prophylaxis:  Lovenox   Kaylee Massed, PA-C [facility] [phone] 08/20/2015 7:35 AM    I have examined the patient, reviewed and agree with above. Stable overall. Excellent Doppler flow in her left foot. Above-knee and below-knee popliteal incisions look good with no evidence of hematoma. Hypoglycemia is a problem. Begin to mobilize.  Kaylee Began, MD 08/20/2015 10:20 AM

## 2015-08-21 ENCOUNTER — Inpatient Hospital Stay (HOSPITAL_COMMUNITY): Payer: Medicare Other

## 2015-08-21 LAB — BASIC METABOLIC PANEL
ANION GAP: 10 (ref 5–15)
BUN: 8 mg/dL (ref 6–20)
CALCIUM: 8.3 mg/dL — AB (ref 8.9–10.3)
CO2: 25 mmol/L (ref 22–32)
CREATININE: 0.84 mg/dL (ref 0.44–1.00)
Chloride: 103 mmol/L (ref 101–111)
Glucose, Bld: 202 mg/dL — ABNORMAL HIGH (ref 65–99)
Potassium: 4.2 mmol/L (ref 3.5–5.1)
SODIUM: 138 mmol/L (ref 135–145)

## 2015-08-21 LAB — CBC
HEMATOCRIT: 24.3 % — AB (ref 36.0–46.0)
Hemoglobin: 7.7 g/dL — ABNORMAL LOW (ref 12.0–15.0)
MCH: 27.9 pg (ref 26.0–34.0)
MCHC: 31.7 g/dL (ref 30.0–36.0)
MCV: 88 fL (ref 78.0–100.0)
PLATELETS: 294 10*3/uL (ref 150–400)
RBC: 2.76 MIL/uL — ABNORMAL LOW (ref 3.87–5.11)
RDW: 14.8 % (ref 11.5–15.5)
WBC: 6.9 10*3/uL (ref 4.0–10.5)

## 2015-08-21 LAB — GLUCOSE, CAPILLARY
GLUCOSE-CAPILLARY: 188 mg/dL — AB (ref 65–99)
Glucose-Capillary: 142 mg/dL — ABNORMAL HIGH (ref 65–99)

## 2015-08-21 MED ORDER — PIOGLITAZONE HCL 15 MG PO TABS
15.0000 mg | ORAL_TABLET | Freq: Every day | ORAL | Status: DC
  Filled 2015-08-21 (×2): qty 1

## 2015-08-21 MED ORDER — MEPERIDINE HCL 50 MG PO TABS: 50.0000 mg | ORAL_TABLET | ORAL | Status: DC | PRN

## 2015-08-21 MED ORDER — METFORMIN HCL 850 MG PO TABS
850.0000 mg | ORAL_TABLET | Freq: Every day | ORAL | Status: DC
  Filled 2015-08-21 (×2): qty 1

## 2015-08-21 MED ORDER — PIOGLITAZONE HCL-METFORMIN HCL 15-850 MG PO TABS: 1.0000 | ORAL_TABLET | Freq: Every day | ORAL | Status: DC

## 2015-08-21 MED ORDER — MEPERIDINE HCL 50 MG PO TABS
50.0000 mg | ORAL_TABLET | ORAL | Status: DC | PRN
  Administered 2015-08-21 (×2): 100 mg via ORAL
  Filled 2015-08-21 (×2): qty 2

## 2015-08-21 NOTE — Discharge Summary (Signed)
Discharge Summary     [name] Apr 16, 1946 70 y.o. 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 female Here today for continued follow-up of ulcerations of her toes of her left foot. She is here today with her husband. He is having to make some very difficult financial decisions regarding selling of beach home versus home in [place] and also potentially moving into [employer]. I unfortunately had very little knowledge regarding the regulations around this. He has discussed this as well with Dr. Forde Dandy.  Hospital Course:  The patient was admitted to the hospital and taken to the operating room on 08/19/2015 and underwent: Bypass from prior left distal anastomosis of the prior vein left femoropopliteal above-knee bypass the below-knee popliteal artery with reversed small saphenous vein    The pt tolerated the procedure well and was transported to the PACU in good condition.   By POD 1, pt was doing well with +doppler flow left AT/PT/peroneal.  Her medial bandages were removed and incisions look good.  Her foley catheter was removed without difficulty.  She did have some wounds on her thighs that was received from the brake on the wheelchair when transferring and a WOC consult to evaluate.  They recommended Santyl to wounds daily, which is what the pt was doing prior to admission.  She is on multiple medications for diabetes.  These were held on POD 1 as she was hypoglycemic.  On POD 2, her blood sugars were starting rebound.  Her medication continued to be held as her FSBS was 140 at last check.  Continue SSI at this time.  By POD 2, her posterior bandage was removed and incision is clean and dry with steri strips in tact.    She did have acute blood loss anemia, but she has tolerated this and did not require a blood  transfusion.   She did work with PT/OT who recommended rehab.  The remainder of the hospital course consisted of increasing mobilization and increasing intake of solids without difficulty.  CBC    Component Value Date/Time   WBC 6.9 08/21/2015 0524   RBC 2.76* 08/21/2015 0524   HGB 7.7* 08/21/2015 0524   HCT 24.3* 08/21/2015 0524   PLT 294 08/21/2015 0524   MCV 88.0 08/21/2015 0524   MCH 27.9 08/21/2015 0524   MCHC 31.7 08/21/2015 0524   RDW 14.8 08/21/2015 0524   LYMPHSABS 1.9 08/18/2014 2110   MONOABS 0.5 08/18/2014 2110   EOSABS 0.1 08/18/2014 2110   BASOSABS 0.0 08/18/2014 2110    BMET    Component Value Date/Time   NA 138 08/21/2015 0524   K 4.2 08/21/2015 0524   CL 103 08/21/2015 0524   CO2 25 08/21/2015 0524   GLUCOSE 202* 08/21/2015 0524   BUN 8 08/21/2015 0524   CREATININE 0.84 08/21/2015 0524   CALCIUM 8.3* 08/21/2015 0524   GFRNONAA >60 08/21/2015 0524   GFRAA >60 08/21/2015 0524     Discharge Instructions    Call MD for:  redness, tenderness, or signs of infection (pain, swelling, bleeding, redness, odor or green/yellow discharge around incision site)    Complete by:  As directed      Call MD for:  severe or increased pain, loss or decreased feeling  in affected limb(s)    Complete by:  As directed  Call MD for:  temperature >100.5    Complete by:  As directed      Discharge instructions    Complete by:  As directed   We are restarting your ActoPlus Met in the hospital.  Add back other diabetic medications as blood sugar allows.  This can be managed at [facility].     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 Pressure ulcer 08/20/2015  . PAD (peripheral artery disease)  ([place]) 08/19/2015  . Cellulitis and abscess of foot 06/05/2015  . Encounter for central line placement 08/20/2014  . UTI (lower urinary tract infection) 08/19/2014  . Aftercare following surgery of the circulatory system, [place] 01/22/2013  . PVD (peripheral vascular disease) ([place]) 08/21/2012  . Fall from slipping 07/10/2012  . Non-healing surgical wound 06/14/2012  . S/P BKA (below knee amputation) ([place]) 04/27/2012  . Acute myocardial infarction, subendocardial infarction, initial episode of care ([place]) 04/01/2012  . Acute combined systolic and diastolic heart failure ([place]) 03/30/2012  . Pulmonary edema, acute ([place]) 03/29/2012  . Respiratory failure, post-operative ([place]) 03/26/2012  . Pain in limb 03/20/2012  . Atherosclerotic PVD with intermittent claudication ([place]) 02/21/2012  . Atherosclerosis of native arteries of the extremities with ulceration ([place]) 11/11/2011  . Chronic ulcer of unspecified site 11/11/2011  . Cellulitis 11/11/2011  . Atherosclerosis of native arteries of extremity with intermittent claudication ([place]) 09/06/2011  . CORONARY ATHEROSCLEROSIS NATIVE CORONARY ARTERY 03/12/2009  . 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 Diabetes mellitus type 2 with neurological manifestations ([place]) 10/11/2008  . Hyperlipemia 10/11/2008  . HYPERTENSION, UNSPECIFIED 10/11/2008  . UNSPECIFIED PERIPHERAL VASCULAR DISEASE 10/11/2008   Past Medical History  Diagnosis Date  . Hypertension     Unspecified  . Hyperlipidemia     Mixed  . Tobacco abuse     Remote  . Loose, teeth     has loose bridge and two loose teeth holding it  . PONV (postoperative nausea and vomiting)   . Type II diabetes mellitus ([place])   . GERD (gastroesophageal reflux disease)   . Anxiety   . Peripheral vascular disease (HCC)     unspecified, a. s/p L CEA b. s/p B fem-pop bypass  . Raynaud disease   . Stones in the urinary tract   . UTI (urinary tract infection)   . Anemia     hx  . Coronary artery disease       a. s/p NSTEMI 02/2009 - PCI LCX with Xience DES. Otherwise branch vessel and Dist. RCA dzs. NL EF.   Marland Kitchen Myocardial infarction ([place])   . CHF (congestive heart failure) ([place])   . Kidney stones   . UTI (lower urinary tract infection)   . Constipation   . Hernia, umbilical   . History of blood transfusion   . Fall from slipping on wet surface Jan. 7, 2014    Right stump bleeding from fall  . Carotid artery occlusion        Medication List    TAKE these medications        ACIDOPHILUS PEARLS Caps  Take 1 capsule by mouth daily.  ALPRAZolam 0.25 MG tablet  Commonly known as:  XANAX  Take 0.25 mg by mouth 2 (two) times daily.     ALTACE 1.25 MG capsule  Generic drug:  ramipril  Take 1.25 mg by mouth daily. BRAND NAME ONLY     amoxicillin 875 MG tablet  Commonly known as:  AMOXIL  Take 875 mg by mouth 2 (two) times daily. Reported on 08/13/2015     aspirin EC 81 MG tablet  Take 1 tablet (81 mg total) by mouth daily.     calcium carbonate 500 MG chewable tablet  Commonly known as:  TUMS - dosed in mg elemental calcium  Chew 1,000 mg by mouth daily as needed for indigestion or heartburn.     DEXILANT 60 MG capsule  Generic drug:  dexlansoprazole  Take 60 mg by mouth 2 (two) times daily.     ergocalciferol 50000 units capsule  Commonly known as:  VITAMIN D2  Take 50,000 Units by mouth 3 (three) times a week.     furosemide 40 MG tablet  Commonly known as:  LASIX  Take 1 tablet (40 mg total) by mouth daily.     iron polysaccharides 150 MG capsule  Commonly known as:  NIFEREX  Take three (3) capsules (450 mg total) by mouth every morning and take four (4) capsules (600 mg total) by mouth every evening.     isosorbide mononitrate 30 MG 24 hr tablet  Commonly known as:  IMDUR  Take 30 mg by mouth daily.     meperidine 50 MG tablet  Commonly known as:  DEMEROL  Take 1-2 tablets (50-100 mg total) by mouth every 4 (four) hours as needed for severe pain.     metoprolol  succinate 50 MG 24 hr tablet  Commonly known as:  TOPROL-XL  Take 50 mg by mouth 2 (two) times daily. Take with or immediately following a meal.     nitroGLYCERIN 0.4 MG SL tablet  Commonly known as:  NITROSTAT  Place 1 tablet (0.4 mg total) under the tongue every 5 (five) minutes as needed. For chest pain     omega-3 acid ethyl esters 1 g capsule  Commonly known as:  LOVAZA  Take 2 g by mouth 2 (two) times daily.     pioglitazone-metformin 15-850 MG tablet  Commonly known as:  ACTOPLUS MET  Take 1 tablet by mouth daily. Brand name only-does not work if components are given separately per patient 15-850 MG     potassium chloride 10 MEQ tablet  Commonly known as:  K-DUR  Take 1 tablet (10 mEq total) by mouth 2 (two) times daily.     prochlorperazine 10 MG tablet  Commonly known as:  COMPAZINE  Take 1 tablet (10 mg total) by mouth every 6 (six) hours as needed. For nausea     rosuvastatin 10 MG tablet  Commonly known as:  CRESTOR  Take 10 mg by mouth every Monday.     SANTYL ointment  Generic drug:  collagenase  Apply 1 application topically daily.     sitaGLIPtin 100 MG tablet  Commonly known as:  JANUVIA  Take 100 mg by mouth at bedtime.     sodium chloride 0.65 % Soln nasal spray  Commonly known as:  OCEAN  Place 2-4 sprays into both nostrils every 4 (four) hours as needed (for nose bleeds).     sodium chloride 5 % ophthalmic solution  Commonly known as:  MURO 128  Place 1 drop into both  eyes daily as needed. (lubrication)     sodium chloride 5 % ophthalmic solution  Commonly known as:  MURO 128  1 drop.     sulfamethoxazole-trimethoprim 800-160 MG tablet  Commonly known as:  BACTRIM DS,SEPTRA DS  Take 1 tablet by mouth daily.     TRESIBA FLEXTOUCH 100 UNIT/ML Sopn  Generic drug:  Insulin Degludec  Inject 16 Units into the skin daily.     ZETIA 10 MG tablet  Generic drug:  ezetimibe  TAKE 1 TABLET (10 MG TOTAL) BY MOUTH DAILY.        Prescriptions  given: 1.  Demerol#60 No Refill  Instructions: 1.  Shower daily with soap and water starting 08/22/15 2.  Restart diabetic medications as blood sugars allow.  She has been running low in the hospital and her medications were held.  She has been on a SSI, but has not received insulin.  Disposition: [facility] facility  Patient's condition: is Good  Follow up: 1. Dr. Donnetta Hutching in 2 weeks   Leontine Locket, PA-C [facility] [phone] 08/21/2015  7:57 AM  - For VQI Registry use --- Instructions: Press F2 to tab through selections.  Delete question if not applicable.   Post-op:  Wound infection: No  Graft infection: No  Transfusion: No  If yes, n/a units given New Arrhythmia: No Ipsilateral amputation: No, _0  Minor, _1  BKA, _2  AKA Discharge patency: [x ] Primary, _3  Primary assisted, _4  Secondary, _5  Occluded Patency judged by: [x ] Dopper only, _6  Palpable graft pulse, _7  Palpable distal pulse, _8  ABI inc. > 0.15, _9  Duplex Discharge ABI: R amputation L not done D/C Ambulatory Status: Ambulatory with Assistance  Complications: MI: No, <AJGOTLXBWIOMBTDH>_7<\/CBULAGTXMIWOEHOZ>_22  Troponin only, _11  EKG or Clinical CHF: No Resp failure:No, _12  Pneumonia, _13  Ventilator Chg in renal function: No, _14  Inc. Cr > 0.5, _15  Temp. Dialysis, _16  Permanent dialysis Stroke: No, _17  Minor, _18  Major Return to OR: No  Reason for return to OR: _19  Bleeding, _20  Infection, _21  Thrombosis, _22  Revision  Discharge medications: Statin use:  yes ASA use:  yes Plavix use:  no Beta blocker use: yes ACEI use:   yes ARB use:  no Coumadin use: no

## 2015-08-21 NOTE — Progress Notes (Addendum)
Pt to be d/c today to [facility]; spouse indicates they have a bed. Will continue to monitor. Returned home meds supplied to Rx; order for PICC d/c by IV team in place.

## 2015-08-21 NOTE — Care Management Important Message (Signed)
Important Message  Patient Details  Name: [name] MRN: [id number] Date of Birth: Aug 13, 1945   Medicare Important Message Given:  Yes    Kyla Balzarine 08/21/2015, 11:53 AM

## 2015-08-21 NOTE — Progress Notes (Signed)
Inpatient Diabetes Program Recommendations  AACE/ADA: New Consensus Statement on Inpatient Glycemic Control (2015)  Target Ranges:  Prepandial:   less than 140 mg/dL      Peak postprandial:   less than 180 mg/dL (1-2 hours)      Critically ill patients:  140 - 180 mg/dL   Results for [name], [name] (MRN [id number]) as of 08/21/2015 07:33  Ref. Range 08/20/2015 07:59 08/20/2015 08:00 08/20/2015 08:35 08/20/2015 12:36 08/20/2015 17:44 08/20/2015 21:22  Glucose-Capillary Latest Ref Range: 65-99 mg/dL 69 54 (L) 311 (H) 216 (H) 120 (H) 161 (H)   Results for [name], [name] (MRN [id number]) as of 08/21/2015 07:33  Ref. Range 08/21/2015 05:24  Glucose Latest Ref Range: 65-99 mg/dL 257 (H)    Outpatient Diabetes medications: Tresiba 16 units daily             Actoplus/Metformin one tab daily             Januvia 100 mg daily  Current Insulin Orders: Novolog Moderate SSI (0-15 units) TID AC     MD- Note patient had mild Hypoglycemia yesterday AM (02/16).    Now patient with elevated lab glucose this AM.  Please consider restarting 50% of patient's home dose of basal insulin- Recommend Levemir 8 units daily (we do not have [place] on formulary)     --Will follow patient during hospitalization--  Ambrose Finland RN, MSN, CDE Diabetes Coordinator Inpatient Glycemic Control Team Team Pager: [phone] (8a-5p)

## 2015-08-21 NOTE — Clinical Social Work Note (Signed)
Patient has a bed at [facility] SNF/ALF. BSW intern has contacted [place], admissions Interior and spatial designer, to make her aware of patient's arrival today. Transportation will be set up via [facility]. BSW intern has also f/u with patient's spouse, [name] in reference to discharge disposition.  BSW intern is signing off. If any further Social Work needs arise please re-consult.   [employer] intern  [phone]

## 2015-08-21 NOTE — Progress Notes (Signed)
Physical medicine rehabilitation consult requested chart reviewed. Patient status post left femoral-popliteal above-knee bypass grafting. Patient  plan discharge to [facility] skilled facility for ongoing therapies and recuperation after recent bypass surgery. All issues in regards to discharge planning were discussed at length with husband and wife. Will defer formal rehabilitation consult at this time and recommendations for case management to arrange discharge to [facility]

## 2015-08-21 NOTE — Care Management Note (Signed)
Case Management Note  Patient Details  Name: [name] MRN: [id number] Date of Birth: 1945-09-30  Subjective/Objective:     Patient is for dc to [facility] SN[facility] today, CSW  Following.  [facility] will provide transportation.               Action/Plan:   Expected Discharge Date:                  Expected Discharge Plan:  Skilled Nursing Facility  In-House Referral:  Clinical Social Work  Discharge planning Services  CM Consult  Post Acute Care Choice:    Choice offered to:     DME Arranged:    DME Agency:     HH Arranged:    HH Agency:     Status of Service:  Completed, signed off  Medicare Important Message Given:    Date Medicare IM Given:    Medicare IM give by:    Date Additional Medicare IM Given:    Additional Medicare Important Message give by:     If discussed at Long Length of Stay Meetings, dates discussed:    Additional Comments:  Leone Haven, RN 08/21/2015, 10:28 AM

## 2015-08-21 NOTE — Clinical Social Work Placement (Signed)
   CLINICAL SOCIAL WORK PLACEMENT  NOTE  Date:  08/21/2015  Patient Details  Name: [name] MRN: [id number] Date of Birth: 05-25-1946  Clinical Social Work is seeking post-discharge placement for this patient at the Skilled  Nursing Facility level of care (*CSW will initial, date and re-position this form in  chart as items are completed):  Yes   Patient/family provided with [facility] Clinical Social Work Department's list of facilities offering this level of care within the geographic area requested by the patient (or if unable, by the patient's family).  Yes   Patient/family informed of their freedom to choose among providers that offer the needed level of care, that participate in Medicare, Medicaid or managed care program needed by the patient, have an available bed and are willing to accept the patient.  Yes   Patient/family informed of [facility]'s ownership interest in [facility] and [facility], as well as of the fact that they are under no obligation to receive care at these facilities.  PASRR submitted to EDS on       PASRR number received on       Existing PASRR number confirmed on       FL2 transmitted to all facilities in geographic area requested by pt/family on 08/21/15     FL2 transmitted to all facilities within larger geographic area on       Patient informed that his/her managed care company has contracts with or will negotiate with certain facilities, including the following:        Yes   Patient/family informed of bed offers received.  Patient chooses bed at       Physician recommends and patient chooses bed at      Patient to be transferred to   on  .  Patient to be transferred to facility by       Patient family notified on   of transfer.  Name of family member notified:        PHYSICIAN Please sign FL2     Additional Comment:    _______________________________________________ Orson Gear, Student-SW 08/21/2015, 9:45  AM

## 2015-08-21 NOTE — Clinical Social Work Note (Signed)
Clinical Social Work Assessment  Patient Details  Name: [name] MRN: [id number] Date of Birth: 23-Nov-1945  Date of referral:  08/21/15               Reason for consult:  Facility Placement, Discharge Planning                Permission sought to share information with:  Family Supports Permission granted to share information::  Yes, Verbal Permission Granted  Name::      Phineas Semen)  Agency::   (SNF)  Relationship::   (spouse-Alan)  Contact Information:   Phineas Semen)  Housing/Transportation Living arrangements for the past 2 months:  Single Family Home Source of Information:  Spouse Patient Interpreter Needed:  None Criminal Activity/Legal Involvement Pertinent to Current Situation/Hospitalization:  No - Comment as needed Significant Relationships:  Spouse Lives with:  Spouse Do you feel safe going back to the place where you live?  No Need for family participation in patient care:  Yes (Comment)  Care giving concerns:   Patient's spouse has not expressed any care giving concerns at this time.   Social Worker assessment / plan:   BSW intern has spoken with patient's spouse, Hessie Diener, by phone to discuss discharge disposition. BSW intern made patient's spouse aware of SNF recommendations given by PT. Patient's spouse informed BSW intern that he has made previous arrangements for patient to d/c to [facility]; SNF/ALF. Spouse further stated that he's spoken with Durward Mallard at [facility] and made her aware of patient's arrival once medically stable. Patient lives at home with spouse therefore 24 hour supervision will be provided once pt is d/c from SNF. BSW intern to fax patient clinical to [facility] for review. BSW intern to also contact Durward Mallard to verify that patient d/c plan is set for today. BSW intern remains available if any further Social Work needs arises.  Employment status:  Retired Health and safety inspector:  Medicare PT Recommendations:    Information  / Referral to community resources:  Skilled Nursing Facility  Patient/Family's Response to care:   Patient's spouse was very helpful to [employer] during assessment. Patient's spouse appreciated Social Work intervention which was given by [employer].  Patient/Family's Understanding of and Emotional Response to Diagnosis, Current Treatment, and Prognosis:   Patient and patient's spouse understands need for further medical care and rehab at [facility].   Emotional Assessment Appearance:   (unable to assess) Attitude/Demeanor/Rapport:  Unable to Assess Affect (typically observed):  Unable to Assess Orientation:  Oriented to Self, Oriented to Place, Oriented to  Time, Oriented to Situation Alcohol / Substance use:  Not Applicable Psych involvement (Current and /or in the community):  No (Comment)  Discharge Needs  Concerns to be addressed:  No discharge needs identified Readmission within the last 30 days:  No Current discharge risk:  None Barriers to Discharge:  No Barriers Identified   Orson Gear, Student-SW 08/21/2015, 9:55 AM

## 2015-08-21 NOTE — [place] FL2 (Signed)
[place] MEDICAID FL2 LEVEL OF CARE SCREENING TOOL     IDENTIFICATION  Patient Name: [name] Birthdate: 1946-02-04 Sex: female Admission Date (Current Location): 08/19/2015  [place] and [name] Number:  Haynes Bast [id number] (BLUE CROSS BLUE SHIELD/BCBS SUPPLEMENT- [id number]) Facility and Address:  [facility], [address], [place], [place] [address]      Provider Number: [id number]  Attending Physician Name and Address:  Larina Earthly, MD  Relative Name and Phone Number:   Hessie Diener Cain-[phone])    Current Level of Care: Hospital Recommended Level of Care: Skilled Nursing Facility Prior Approval Number:    Date Approved/Denied:   PASRR Number:    Discharge Plan: SNF    Current Diagnoses: Patient Active Problem List   Diagnosis Date Noted  . Pressure ulcer 08/20/2015  . PAD (peripheral artery disease) (HCC) 08/19/2015  . Cellulitis and abscess of foot 06/05/2015  . Encounter for central line placement 08/20/2014  . UTI (lower urinary tract infection) 08/19/2014  . Aftercare following surgery of the circulatory system, NEC 01/22/2013  . PVD (peripheral vascular disease) (HCC) 08/21/2012  . Fall from slipping 07/10/2012  . Non-healing surgical wound 06/14/2012  . S/P BKA (below knee amputation) (HCC) 04/27/2012  . Acute myocardial infarction, subendocardial infarction, initial episode of care (HCC) 04/01/2012  . Acute combined systolic and diastolic heart failure (HCC) 03/30/2012  . Pulmonary edema, acute (HCC) 03/29/2012  . Respiratory failure, post-operative (HCC) 03/26/2012  . Pain in limb 03/20/2012  . Atherosclerotic PVD with intermittent claudication (HCC) 02/21/2012  . Atherosclerosis of native arteries of the extremities with ulceration (HCC) 11/11/2011  . Chronic ulcer of unspecified site 11/11/2011  . Cellulitis 11/11/2011  . Atherosclerosis of native arteries of extremity with intermittent claudication (HCC)  09/06/2011  . CORONARY ATHEROSCLEROSIS NATIVE CORONARY ARTERY 03/12/2009  . 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 Hyperlipemia 10/11/2008  . HYPERTENSION, UNSPECIFIED 10/11/2008  . UNSPECIFIED PERIPHERAL VASCULAR DISEASE 10/11/2008    Orientation RESPIRATION BLADDER Height & Weight     Self, Time, Situation, Place  Normal Continent Weight: 155 lb 6.8 oz (70.5 kg) Height:   (162.6 cm)  BEHAVIORAL SYMPTOMS/MOOD NEUROLOGICAL BOWEL NUTRITION STATUS        Diet (HEART HEALTHY/CARB MODIFIED)  AMBULATORY STATUS COMMUNICATION OF NEEDS Skin   Limited Assist   PU Stage and Appropriate Care                       Personal Care Assistance Level of Assistance  Feeding, Bathing, Dressing Bathing Assistance: Maximum assistance Feeding assistance: Independent Dressing Assistance: Limited assistance     Functional Limitations Info             SPECIAL CARE FACTORS FREQUENCY  PT (By licensed PT), OT (By licensed OT)     PT Frequency:  (3X/WEEK) OT Frequency:  (2X/WEEK)            Contractures      Additional Factors Info  Allergies, Code Status Code Status Info:  (PRIOR) Allergies Info:  (Acetaminophen, Ceftriaxone, Codeine, Erythromycin, Hydrocodone-acetaminophen, Hydromorphone, Propoxyphene Hcl, Statins, Eggs Or Egg-derived Products, Ibuprofen, Shellfish-derived Products)           Current Medications (08/21/2015):  This is the current hospital active medication list Current Facility-Administered Medications  Medication Dose Route Frequency Provider Last Rate Last Dose  . 0.9 %  sodium chloride infusion   Intravenous Continuous Dara Lords, PA-C  75 mL/hr at 08/20/15 1300    . acetaminophen (TYLENOL) tablet 325-650 mg  325-650 mg Oral Q4H PRN [facility] Angee Gupton, PA-C       Or  . acetaminophen (TYLENOL) suppository 325-650 mg  325-650 mg Rectal Q4H PRN Kendrik Mcshan J Jency Schnieders, PA-C      .  acidophilus (RISAQUAD) capsule 1 capsule  1 capsule Oral Daily Sitara Cashwell J Lynnae Ludemann, PA-C   1 capsule at 08/21/15 0941  . ALPRAZolam Prudy Feeler) tablet 0.25 mg  0.25 mg Oral BID [facility] Ryanna Teschner, PA-C   0.25 mg at 08/21/15 7591  . alum & mag hydroxide-simeth (MAALOX/MYLANTA) 200-200-20 MG/5ML suspension 15-30 mL  15-30 mL Oral Q2H PRN Achol Azpeitia J Lizett Chowning, PA-C      . antiseptic oral rinse (CPC / CETYLPYRIDINIUM CHLORIDE 0.05%) solution 7 mL  7 mL Mouth Rinse BID Larina Earthly, MD   7 mL at 08/21/15 0941  . aspirin EC tablet 81 mg  81 mg Oral Daily [facility] Zakaree Mcclenahan, PA-C   81 mg at 08/21/15 6384  . bisacodyl (DULCOLAX) suppository 10 mg  10 mg Rectal Daily PRN [facility] Talisha Erby, PA-C      . calcium carbonate (TUMS - dosed in mg elemental calcium) chewable tablet 1,000 mg  1,000 mg Oral Daily PRN [facility] Chasitee Zenker, PA-C   1,000 mg at 08/21/15 0129  . collagenase (SANTYL) ointment 1 application  1 application Topical Daily Larina Earthly, MD   1 application at 08/21/15 0940  . dexlansoprazole (DEXILANT) capsule 60 mg  60 mg Oral BID Darl Householder Masters, RPH   60 mg at 08/21/15 6659  . diphenhydrAMINE (BENADRYL) capsule 25 mg  25 mg Oral Q8H PRN Kaniesha Barile J Ahaan Zobrist, PA-C      . docusate sodium (COLACE) capsule 100 mg  100 mg Oral Daily Damaris Abeln J Kody Brandl, PA-C   100 mg at 08/20/15 0940  . enoxaparin (LOVENOX) injection 40 mg  40 mg Subcutaneous Q24H Lupita Leash, MD      . ezetimibe (ZETIA) tablet 10 mg  10 mg Oral Daily [facility] Scotty Weigelt, PA-C   10 mg at 08/21/15 0943  . furosemide (LASIX) tablet 40 mg  40 mg Oral Daily Naraly Fritcher J Khalani Novoa, PA-C   40 mg at 08/21/15 0945  . guaiFENesin-dextromethorphan (ROBITUSSIN DM) 100-10 MG/5ML syrup 15 mL  15 mL Oral Q4H PRN Kiri Hinderliter J Charina Fons, PA-C      . hydrALAZINE (APRESOLINE) injection 5 mg  5 mg Intravenous Q20 Min PRN Lexi Conaty J Angy Swearengin, PA-C      . insulin aspart (novoLOG) injection 0-15 Units  0-15 Units Subcutaneous TID WC Enis Leatherwood J Muhanad Torosyan, PA-C   0 Units at 08/19/15 1701  .  isosorbide mononitrate (IMDUR) 24 hr tablet 30 mg  30 mg Oral Daily Wajiha Versteeg J Jaasiel Hollyfield, PA-C   30 mg at 08/21/15 0946  . labetalol (NORMODYNE,TRANDATE) injection 10 mg  10 mg Intravenous Q10 min PRN Daryle Amis J Milina Pagett, PA-C      . magnesium hydroxide (MILK OF MAGNESIA) suspension 30 mL  30 mL Oral Daily PRN Tristan Proto J Sahvanna Mcmanigal, PA-C      . meperidine (DEMEROL) tablet 50-100 mg  50-100 mg Oral Q4H PRN [facility] Peyton Rossner, PA-C   100 mg at 08/21/15 0856  . metoprolol (LOPRESSOR) injection 2-5 mg  2-5 mg Intravenous Q2H PRN Shilynn Hoch J Kumar Falwell, PA-C      . metoprolol succinate (TOPROL-XL) 24 hr tablet 50 mg  50 mg Oral BID Kale Dols J Berania Peedin, PA-C   50 mg  at 08/21/15 0946  . morphine 2 MG/ML injection 2-4 mg  2-4 mg Intravenous Q2H PRN [facility] Yug Loria, PA-C   4 mg at 08/21/15 0520  . nitroGLYCERIN (NITROSTAT) SL tablet 0.4 mg  0.4 mg Sublingual Q5 min PRN Aayushi Solorzano J Dorlis Judice, PA-C      . omega-3 acid ethyl esters (LOVAZA) capsule 2 g  2 g Oral BID [facility] Kyra Laffey, PA-C   2 g at 08/21/15 0947  . ondansetron (ZOFRAN) injection 4 mg  4 mg Intravenous Q6H PRN Jaydan Chretien J Ambrie Carte, PA-C      . phenol (CHLORASEPTIC) mouth spray 1 spray  1 spray Mouth/Throat PRN Kirstan Fentress J Oza Oberle, PA-C      . potassium chloride (K-DUR,KLOR-CON) CR tablet 10 mEq  10 mEq Oral BID [facility] Win Guajardo, PA-C   10 mEq at 08/21/15 0948  . potassium chloride SA (K-DUR,KLOR-CON) CR tablet 20-40 mEq  20-40 mEq Oral Daily PRN Layci Stenglein J Kaitlynn Tramontana, PA-C      . prochlorperazine (COMPAZINE) tablet 10 mg  10 mg Oral Q6H PRN Jessly Lebeck J Malin Cervini, PA-C      . ramipril (ALTACE) capsule 1.25 mg  1.25 mg Oral Daily Jakylah Bassinger J Inell Mimbs, PA-C   1.25 mg at 08/21/15 0948  . [START ON 08/24/2015] rosuvastatin (CRESTOR) tablet 10 mg  10 mg Oral Q Mon Shastina Rua J Sherion Dooly, PA-C      . sodium chloride (MURO 128) 5 % ophthalmic solution 1 drop  1 drop Both Eyes Daily PRN Shahid Flori J Tiea Manninen, PA-C      . sodium chloride (OCEAN) 0.65 % nasal spray 2-4 spray  2-4 spray Each Nare Q4H PRN Laylana Gerwig J  Mio Schellinger, PA-C      . sulfamethoxazole-trimethoprim (BACTRIM DS,SEPTRA DS) 800-160 MG per tablet 1 tablet  1 tablet Oral Daily [facility] Sabas Frett, PA-C   1 tablet at 08/21/15 0950  . Vitamin D (Ergocalciferol) (DRISDOL) capsule 50,000 Units  50,000 Units Oral Once per day on Mon Wed Fri Euline Kimbler J Jerrit Horen, PA-C         Discharge Medications: Please see discharge summary for a list of discharge medications.  Relevant Imaging Results:  Relevant Lab Results:   Additional Information  (SSN: [id number])  Orson Gear, Student-SW [phone]

## 2015-08-21 NOTE — Progress Notes (Addendum)
  Progress Note    08/21/2015 7:39 AM 2 Days Post-Op  Subjective:  Feeling better  Tm 99.8 now afebrile HR  90's-100's NSR/ST 841'L-244'W systolic 10% RA  Filed Vitals:   08/20/15 2323 08/21/15 0506  BP: 138/60 139/55  Pulse: 101 98  Temp: 98.9 F (37.2 C) 97.8 F (36.6 C)  Resp: 20 17    Physical Exam: Cardiac:  regular Lungs:  Non labored Incisions:  All are clean and dry  Extremities:  + doppler signals left DP/PT/peroneal; left foot is warm.   CBC    Component Value Date/Time   WBC 6.9 08/21/2015 0524   RBC 2.76* 08/21/2015 0524   HGB 7.7* 08/21/2015 0524   HCT 24.3* 08/21/2015 0524   PLT 294 08/21/2015 0524   MCV 88.0 08/21/2015 0524   MCH 27.9 08/21/2015 0524   MCHC 31.7 08/21/2015 0524   RDW 14.8 08/21/2015 0524   LYMPHSABS 1.9 08/18/2014 2110   MONOABS 0.5 08/18/2014 2110   EOSABS 0.1 08/18/2014 2110   BASOSABS 0.0 08/18/2014 2110    BMET    Component Value Date/Time   NA 138 08/21/2015 0524   K 4.2 08/21/2015 0524   CL 103 08/21/2015 0524   CO2 25 08/21/2015 0524   GLUCOSE 202* 08/21/2015 0524   BUN 8 08/21/2015 0524   CREATININE 0.84 08/21/2015 0524   CALCIUM 8.3* 08/21/2015 0524   GFRNONAA >60 08/21/2015 0524   GFRAA >60 08/21/2015 0524    INR    Component Value Date/Time   INR 1.07 08/19/2015 0928     Intake/Output Summary (Last 24 hours) at 08/21/15 0739 Last data filed at 08/21/15 0500  Gross per 24 hour  Intake   1965 ml  Output   1275 ml  Net    690 ml     Assessment:  70 y.o. female is s/p:  Bypass from prior left distal anastomosis of the prior vein left femoropopliteal above-knee bypass the below-knee popliteal artery with reversed small saphenous vein   2 Days Post-Op  Plan: -pt with brisk doppler signals left PT/DP/peroneal -blood sugar 202-will restart Metformin this morning-appreciate DM coordinator's assistance -acute blood loss anemia-down slightly, but stable - tolerating -incisions healing  nicely -possibly discharge to [facility] today -continue Santyl to thigh wounds -DVT prophylaxis:  Lovenox   Leontine Locket, PA-C [facility] [phone] 08/21/2015 7:39 AM   Before the ActosPlus Met was given, check of FSBS was 140.  I discontinued this medication.   Will continue to hold po diabetic medications and continue SSI. She will be d/c'd to [facility] today and her DM medications will have to be added back to her regimen as tolerated.  Leontine Locket 08/21/2015 8:17 AM

## 2015-08-25 ENCOUNTER — Non-Acute Institutional Stay (SKILLED_NURSING_FACILITY): Payer: Medicare Other | Admitting: Internal Medicine

## 2015-08-25 ENCOUNTER — Encounter: Payer: Self-pay | Admitting: Internal Medicine

## 2015-08-25 DIAGNOSIS — E11622 Type 2 diabetes mellitus with other skin ulcer: Secondary | ICD-10-CM | POA: Diagnosis not present

## 2015-08-25 DIAGNOSIS — E1149 Type 2 diabetes mellitus with other diabetic neurological complication: Secondary | ICD-10-CM | POA: Diagnosis not present

## 2015-08-25 DIAGNOSIS — I743 Embolism and thrombosis of arteries of the lower extremities: Secondary | ICD-10-CM | POA: Diagnosis not present

## 2015-08-25 DIAGNOSIS — L89892 Pressure ulcer of other site, stage 2: Secondary | ICD-10-CM

## 2015-08-25 DIAGNOSIS — Z89511 Acquired absence of right leg below knee: Secondary | ICD-10-CM | POA: Diagnosis not present

## 2015-08-25 DIAGNOSIS — I96 Gangrene, not elsewhere classified: Secondary | ICD-10-CM

## 2015-08-25 DIAGNOSIS — I739 Peripheral vascular disease, unspecified: Secondary | ICD-10-CM

## 2015-08-25 DIAGNOSIS — I70202 Unspecified atherosclerosis of native arteries of extremities, left leg: Secondary | ICD-10-CM

## 2015-08-25 NOTE — Progress Notes (Signed)
Patient ID: [name], female   DOB: 11/23/1945, 70 y.o.   MRN: [id number]  Provider:  Rexene Edison. Mariea Clonts, D.O., C.M.D. Location:  [facility] Room Number: Melissa of Service:  SNF (31)  PCP: Sheela Stack, MD Patient Care Team: Reynold Bowen, MD as PCP - General Sherren Mocha, MD (Cardiology) Cindee Salt, MD as Consulting Physician (General Surgery) Kathie Rhodes, MD as Consulting Physician (Urology) Rana Snare, MD as Consulting Physician (Urology) Rosetta Posner, MD as Consulting Physician (Vascular Surgery)  Extended Emergency Contact Information Primary Emergency Contact: Idolina Primer Address: [address]          [place], [place] [address] Johnnette Litter of [place] Phone: [phone] Mobile Phone: [phone] Relation: Spouse  Code Status: Full code Goals of Care: Advanced Directive information Advanced Directives 08/25/2015  Does patient have an advance directive? Yes  Type of Advance Directive Living will;Healthcare Power of Attorney  Does patient want to make changes to advanced directive? -  Copy of advanced directive(s) in chart? Yes    Chief Complaint  Patient presents with  . New Admit To SNF    Rehab following hospitalization for Bypass from prior left distal anastomosis of the prior vein left femoropopliteal above-knee bypass the below-knee popliteal artery with reversed small saphenous vein     HPI: Patient is a 70 y.o. 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 Donnetta Hutching on 08/19/15 due to PAD with left foot gangrenous toes and known occluded left popliteal artery.  Per notes, there was a  great improvement in blood flow postoperatively.  Her stay was fairly uncomplicated with an episode of hypoglycemia on POD #1 (was kept off insulin until today when tresiba was resumed) and thigh pressure ulcers that were treated with santyl and foam dressings.  Her acute blood loss anemia was minor and she was placed on iron, but did not require a transfusion.  Since arrival here, she has done well.  She is wearing her prosthesis today and working with therapy.  Her bypass surgical sites are clean, dry and w/o erythema, warmth or drainage.  Her thigh ulcers remain and are being treated here with santyl and gauze dressings now.  These were felt to be due to her wheelchair brakes and rapid transfers with shearing action.  She still has her two gangrenous toes on the left foot.  She is feeling well and tolerating her pain medication.  Bowels are moving.  She also has a fungal fingernail that is being soaked with dial soap.  Her glucose readings were high until her insulin was resumed today.  She is eating and drinking well.  Her nausea has improved.    Past Medical History  Diagnosis Date  . Hypertension     Unspecified  . Hyperlipidemia     Mixed  . Tobacco abuse     Remote  . Loose, teeth     has loose bridge and two loose teeth holding it  . PONV (postoperative nausea and vomiting)   . Type II diabetes mellitus ([place])   . GERD (gastroesophageal reflux disease)   . Anxiety   . Peripheral vascular disease ([place])     unspecified, a.  s/p L CEA b. s/p B fem-pop bypass  . Raynaud disease   . Stones in the urinary tract   . UTI (urinary tract infection)   . Anemia     hx  . Coronary artery disease     a. s/p NSTEMI 02/2009 - PCI LCX with Xience DES. Otherwise branch vessel and Dist. RCA dzs. NL EF.   Marland Kitchen Myocardial infarction ([place])   . CHF (congestive heart failure) ([place])   . Kidney stones   . UTI (lower urinary tract infection)   . Constipation   . Hernia, umbilical   . History of blood  transfusion   . Fall from slipping on wet surface Jan. 7, 2014    Right stump bleeding from fall  . Carotid artery occlusion   . Proliferative diabetic retinopathy of right eye ([place])   . Subclavian steal syndrome   . Diabetic neuropathy with neurologic complication ([place])   . History of right below knee amputation ([place])   . Vitamin D deficiency   . Goiter    Past Surgical History  Procedure Laterality Date  . Vesicovaginal fistula closure w/ tah    . Colonoscopy  11/2010  . Upper gastrointestinal endoscopy  11/2010  . Femoral artery - popliteal artery bypass graft      left  . Abdominal hysterectomy      complete  . Femoral-popliteal bypass graft  08/22/2011    Procedure: BYPASS GRAFT FEMORAL-POPLITEAL ARTERY;  Surgeon: Curt Jews, MD;  Location: [facility];  Service: Vascular;  Laterality: Right;  Thrombectomy and Revision using 49m x 10cm stretch goretex graft  . Intraoperative arteriogram  08/22/2011    Procedure: INTRA OPERATIVE ARTERIOGRAM;  Surgeon: TCurt Jews MD;  [facility];  Service: Vascular;  Laterality: Right;  to lower leg  . Multiple tooth extractions  08-29-2011    5 teeth extracted   . Pci  01/17/12    RLE  . Breast lumpectomy      right  . Tonsillectomy and adenoidectomy    . Coronary angioplasty with stent placement    . Carotid endarterectomy  ~ 2005    left  . Aorta - bilateral femoral artery bypass graft  03/26/2012    Procedure: AORTA BIFEMORAL BYPASS GRAFT;  Surgeon: TRosetta Posner MD;  [facility];  Service: Vascular;  Laterality: N/A;  Aortic-bifemoral bypass using 14x860mHemashield graft .   . Umbilical hernia repair  03/26/2012    Procedure: HERNIA REPAIR UMBILICAL ADULT;  Surgeon: ToRosetta PosnerMD;  [facility];  Service: Vascular;  Laterality: N/A;  Removal of Umbilical hernia sac  . Femoral artery exploration  03/26/2012    Procedure: FEMORAL ARTERY EXPLORATION;  Surgeon: ToRosetta PosnerMD;  [facility];  Service: Vascular;  Laterality: Right;   with Revision of Popliteal-Peroneal bypass graft using 6m14m 10cm thin wall goretex graft  . Amputation  04/23/2012    Procedure: AMPUTATION BELOW KNEE;  Surgeon: TodRosetta PosnerD;  [facility];  Laterality: Right;  . Eye surgery Right Feb. 9, 2015    Cataract  . Eye surgery Left Feb. 16, 2015    Cataract  . Abdominal aortagram N/A 05/17/2011    Procedure: ABDOMINAL AORTAGRAM;  Surgeon: VanSerafina MitchellD;  [facility];  Service: Cardiovascular;  Laterality: N/A;  . Lower extremity angiogram  05/17/2011    Procedure: LOWER EXTREMITY ANGIOGRAM;  Surgeon: VanSerafina MitchellD;  [facility];  Service: Cardiovascular;;  . Percutaneous stent intervention Right 05/17/2011    Procedure: PERCUTANEOUS STENT INTERVENTION;  Surgeon: Vance W Brabham, MD;  [facility] CATH LAB;  Service: Cardiovascular;  Laterality: Right;  . Lower extremity angiogram N/A 09/21/2011    Procedure: LOWER EXTREMITY ANGIOGRAM;  Surgeon: Vance W Brabham, MD;  [facility];  Service: Cardiovascular;  Laterality: N/A;  . Abdominal angiogram  09/21/2011    Procedure: ABDOMINAL ANGIOGRAM;  Surgeon: Vance W Brabham, MD;  [facility];  Service: Cardiovascular;;  . Abdominal aortagram N/A 01/17/2012    Procedure: ABDOMINAL AORTAGRAM;  Surgeon: Vance W Brabham, MD;  [facility];  Service: Cardiovascular;  Laterality: N/A;  . Left heart catheterization with coronary angiogram N/A 04/05/2012    Procedure: LEFT HEART CATHETERIZATION WITH CORONARY ANGIOGRAM;  Surgeon: Michael Cooper, MD;  [facility] CATH [facility];  Service: Cardiovascular;  Laterality: N/A;  . Peripheral vascular catheterization N/A 06/08/2015    Procedure: Abdominal Aortogram w/Lower Extremity;  Surgeon: Charles E Fields, MD;  [facility] INVASIVE CV LAB;  Service: Cardiovascular;  Laterality: N/A;  . Hernia repair    . Femoral-tibial bypass graft Left 08/19/2015    Procedure: LEFT FEMORAL-  ABOVE KNEE  POPLITEAL BYPASS GRAFT (VEIN) to BELOW KNEE POPLITEAL ARTERY BYPASS WITH SMALL SAPHENOUS VEIN GRAFT;  Surgeon: Todd F [place], MD;  [facility];  Service: Vascular;  Laterality: Left;    reports that she quit smoking about 14 years ago. Her smoking use included Cigarettes. She has a 78 pack-year smoking history. She has never used smokeless tobacco. She reports that she drinks about 9.0 oz of alcohol per week. She reports that she does not use illicit drugs. Social History   Social History  . Marital Status: Married    Spouse Name: N/A  . Number of Children: N/A  . Years of Education: N/A   Occupational History  . Not on file.   Social History Main Topics  . Smoking status: Former Smoker -- 2.00 packs/day for 39 years    Types: Cigarettes    Quit date: 07/05/2001  . Smokeless tobacco: Never Used  . Alcohol Use: 9.0 oz/week    1 Shots of liquor, 14 Standard drinks or equivalent per week     Comment: burbon   . Drug Use: No  . Sexual Activity: Not on file   Other Topics Concern  . Not on file   Social History Narrative   Does not get regular exercise    Functional Status Survey: Is the patient deaf or have difficulty hearing?: No Does the patient have difficulty seeing, even when wearing glasses/contacts?: No (does wear glasses) Does the patient have difficulty concentrating, remembering, or making decisions?: No Does the patient have difficulty walking or climbing stairs?: Yes (but is able to do stairs with prosthesis, walker for transfers) Does the patient have difficulty dressing or bathing?: No Does the patient have difficulty doing errands alone such as visiting a doctor's office or shopping?: Yes (only due to her right BKA (not cognitive))  Family History  Problem Relation Age of Onset  . Heart attack Mother   . Heart attack Father   . Hypertension Brother   . Diabetes Brother   . Hypertension Brother     Health Maintenance  Topic Date Due  . Hepatitis C  Screening  03/13/1946  . FOOT EXAM  02/22/1955  . OPHTHALMOLOGY EXAM  02/22/1955  . TETANUS/TDAP  02/22/1964  . DEXA SCAN  02/21/2010  .   PNA vac Low Risk Adult (1 of 2 - PCV13) 02/21/2010  . INFLUENZA VACCINE  02/02/2015  . MAMMOGRAM  11/05/2015  . HEMOGLOBIN A1C  02/16/2016  . COLONOSCOPY  11/17/2020  . ZOSTAVAX  Completed  Sees Dr. South in [facility] and we have some records so immunizations were updated by [facility].  Above list does not appear updated, however.    Allergies  Allergen Reactions  . Acetaminophen Nausea Only and Other (See Comments)    Does not tolerate well, nausea  . Ceftriaxone Itching  . Codeine Nausea And Vomiting  . Erythromycin Nausea And Vomiting  . Hydrocodone-Acetaminophen Nausea And Vomiting  . Hydromorphone Nausea And Vomiting  . Propoxyphene Hcl Nausea And Vomiting  . Statins Other (See Comments)    Leg myalgias  . Eggs Or Egg-Derived Products Other (See Comments)    "pt does not eat" Unspecified reaction  . Ibuprofen Other (See Comments)    Unspecified  Per Pt "Does not tolerate well"  . Shellfish-Derived Products Other (See Comments)    "pt does not eat" Unspecified reaction      Medication List       This list is accurate as of: 08/25/15  2:42 PM.  Always use your most recent med list.               ACIDOPHILUS PEARLS Caps  Take 1 capsule by mouth daily.     ALPRAZolam 0.25 MG tablet  Commonly known as:  XANAX  Take 0.25 mg by mouth 2 (two) times daily.     ALTACE 1.25 MG capsule  Generic drug:  ramipril  Take 1.25 mg by mouth daily. BRAND NAME ONLY     aspirin EC 81 MG tablet  Take 1 tablet (81 mg total) by mouth daily.     Biotin 5 MG/ML Liqd  Take by mouth. 15 cc at bedtime swish and spit     calcium carbonate 500 MG chewable tablet  Commonly known as:  TUMS - dosed in mg elemental calcium  Chew 1,000 mg by mouth daily as needed for indigestion or heartburn.     DEXILANT 60 MG capsule  Generic drug:  dexlansoprazole  Take 60  mg by mouth 2 (two) times daily.     ergocalciferol 50000 units capsule  Commonly known as:  VITAMIN D2  Take 50,000 Units by mouth 3 (three) times a week.     furosemide 40 MG tablet  Commonly known as:  LASIX  Take 1 tablet (40 mg total) by mouth daily.     iron polysaccharides 150 MG capsule  Commonly known as:  NIFEREX  Take three (3) capsules (450 mg total) by mouth every morning and take four (4) capsules (600 mg total) by mouth every evening.     isosorbide mononitrate 30 MG 24 hr tablet  Commonly known as:  IMDUR  Take 30 mg by mouth daily.     meperidine 50 MG tablet  Commonly known as:  DEMEROL  Take 1-2 tablets (50-100 mg total) by mouth every 4 (four) hours as needed for severe pain.     metoprolol succinate 50 MG 24 hr tablet  Commonly known as:  TOPROL-XL  Take 50 mg by mouth 2 (two) times daily. Take with or immediately following a meal.     nitroGLYCERIN 0.4 MG SL tablet  Commonly known as:  NITROSTAT  Place 1 tablet (0.4 mg total) under the tongue every 5 (five) minutes as needed. For chest pain       omega-3 acid ethyl esters 1 g capsule  Commonly known as:  LOVAZA  Take 2 g by mouth 2 (two) times daily.     pioglitazone-metformin 15-850 MG tablet  Commonly known as:  ACTOPLUS MET  Take 1 tablet by mouth daily. Brand name only-does not work if components are given separately per patient 15-850 MG     potassium chloride 10 MEQ tablet  Commonly known as:  K-DUR  Take 1 tablet (10 mEq total) by mouth 2 (two) times daily.     prochlorperazine 10 MG tablet  Commonly known as:  COMPAZINE  Take 1 tablet (10 mg total) by mouth every 6 (six) hours as needed. For nausea     rosuvastatin 10 MG tablet  Commonly known as:  CRESTOR  Take 10 mg by mouth every Monday.     SANTYL ointment  Generic drug:  collagenase  Apply 1 application topically daily.     sitaGLIPtin 100 MG tablet  Commonly known as:  JANUVIA  Take 100 mg by mouth at bedtime.     sodium  chloride 0.65 % Soln nasal spray  Commonly known as:  OCEAN  Place 2-4 sprays into both nostrils every 4 (four) hours as needed (for nose bleeds).     sodium chloride 5 % ophthalmic solution  Commonly known as:  MURO 128  Place 1 drop into both eyes daily as needed. (lubrication)     TRESIBA FLEXTOUCH 100 UNIT/ML Sopn  Generic drug:  Insulin Degludec  Inject 16 Units into the skin daily.     ZETIA 10 MG tablet  Generic drug:  ezetimibe  TAKE 1 TABLET (10 MG TOTAL) BY MOUTH DAILY.        Review of Systems  Constitutional: Positive for activity change. Negative for fever and chills.       Due to surgery  HENT: Negative for congestion and sinus pressure.   Eyes: Negative for visual disturbance.       Wears glasses  Respiratory: Negative for chest tightness and shortness of breath.   Cardiovascular: Negative for chest pain, palpitations and leg swelling.  Gastrointestinal: Negative for nausea, abdominal pain, diarrhea and constipation.  Genitourinary: Negative for dysuria, frequency and difficulty urinating.  Musculoskeletal:       Left foot including heel tender  Skin: Positive for wound.  Allergic/Immunologic:       Diabetic  Neurological: Negative for dizziness, facial asymmetry, weakness and light-headedness.  Psychiatric/Behavioral: Negative for suicidal ideas, hallucinations, confusion, sleep disturbance and decreased concentration. The patient is not nervous/anxious.     Filed Vitals:   08/25/15 1127  BP: 128/59  Pulse: 81  Temp: 97.1 F (36.2 C)  Resp: 29  Height: 5' 4" (1.626 m)  Weight: 154 lb (69.854 kg)   Body mass index is 26.42 kg/(m^2). Physical Exam  Constitutional: She is oriented to person, place, and time. She appears well-developed and well-nourished. No distress.  HENT:  Head: Normocephalic and atraumatic.  Right Ear: External ear normal.  Left Ear: External ear normal.  Nose: Nose normal.  Mouth/Throat: Oropharynx is clear and moist. No  oropharyngeal exudate.  Eyes: Conjunctivae and EOM are normal. Pupils are equal, round, and reactive to light. No scleral icterus.  Neck: Neck supple. No JVD present.  Cardiovascular: Normal rate, regular rhythm and normal heart sounds.   Pulmonary/Chest: Effort normal and breath sounds normal. No respiratory distress. She has no rales.  Abdominal: Soft. Bowel sounds are normal. She exhibits no distension and no mass. There is  no tenderness. There is no rebound and no guarding.  Musculoskeletal: She exhibits edema and tenderness.  Decreased left knee extension due to posterior popliteal incision site; R BKA wearing prosthesis  Lymphadenopathy:    She has no cervical adenopathy.  Neurological: She is alert and oriented to person, place, and time. No cranial nerve deficit. Coordination normal.  Skin:  2nd and 3rd toes on the left foot are gangrenous, foul smelling; left leg with three incisions with glue and steristrips securing them--a couple have come loose around posterior popliteal space where pants were pulled up and down (will be replaced), no significant erythema, warmth, drainage  Psychiatric: She has a normal mood and affect. Her behavior is normal. Judgment and thought content normal.    Labs reviewed: Basic Metabolic Panel:  Recent Labs  08/19/15 0928 08/20/15 0500 08/21/15 0524  NA 141 140 138  K 4.5 4.4 4.2  CL 104 106 103  CO2 27 27 25  GLUCOSE 88 94 202*  BUN 17 10 8  CREATININE 1.14* 0.94 0.84  CALCIUM 9.0 8.4* 8.3*   Liver Function Tests:  Recent Labs  06/05/15 2237 08/19/15 0928  AST 14* 16  ALT 9* 10*  ALKPHOS 65 86  BILITOT 0.3 0.4  PROT 6.4* 6.3*  ALBUMIN 3.3* 2.8*   No results for input(s): LIPASE, AMYLASE in the last 8760 hours. No results for input(s): AMMONIA in the last 8760 hours. CBC:  Recent Labs  08/19/15 0928 08/20/15 0500 08/21/15 0524  WBC 9.8 6.3 6.9  HGB 9.7* 8.0* 7.7*  HCT 30.9* 25.7* 24.3*  MCV 87.8 88.9 88.0  PLT 332 292  294   Cardiac Enzymes: No results for input(s): CKTOTAL, CKMB, CKMBINDEX, TROPONINI in the last 8760 hours. BNP: Invalid input(s): POCBNP Lab Results  Component Value Date   HGBA1C 6.6* 08/19/2015   Imaging and Procedures obtained prior to SNF admission: Ir Fluoro Guide Cv Line Right  08/18/2015  INDICATION: Peripheral arterial disease. Poor venous access. Request PICC line placement in preparation for upcoming surgical procedure. EXAM: RIGHT UPPER EXTREMITY PICC LINE PLACEMENT WITH ULTRASOUND AND FLUOROSCOPIC GUIDANCE MEDICATIONS: None; ANESTHESIA/SEDATION: Moderate Sedation Time:  None The patient was continuously monitored during the procedure by the interventional radiology nurse under my direct supervision. FLUOROSCOPY TIME:  Fluoroscopy Time: 48 seconds COMPLICATIONS: None immediate. PROCEDURE: The patient was advised of the possible risks and complications and agreed to undergo the procedure. The patient was then brought to the angiographic suite for the procedure. The right arm was prepped with chlorhexidine, draped in the usual sterile fashion using maximum barrier technique (cap and mask, sterile gown, sterile gloves, large sterile sheet, hand hygiene and cutaneous antiseptic). Local anesthesia was attained by infiltration with 1% lidocaine. Ultrasound demonstrated patency of the basilic vein, and this was documented with an image. Under real-time ultrasound guidance, this vein was accessed with a 21 gauge micropuncture needle and image documentation was performed. The needle was exchanged over a guidewire for a peel-away sheath through which a 37 cm 5 French dual lumen power injectable PICC was advanced, and positioned with its tip at the lower SVC/right atrial junction. Fluoroscopy during the procedure and fluoro spot radiograph confirms appropriate catheter position. The catheter was flushed, secured to the skin with Prolene sutures, and covered with a sterile dressing. IMPRESSION:  Successful placement of a right arm PICC with sonographic and fluoroscopic guidance. The catheter is ready for use. Read by: Kevin Bruning PA-C Electronically Signed   By: John  Watts M.D.     On: 08/18/2015 13:51   Ir Us Guide Vasc Access Right  08/18/2015  INDICATION: Peripheral arterial disease. Poor venous access. Request PICC line placement in preparation for upcoming surgical procedure. EXAM: RIGHT UPPER EXTREMITY PICC LINE PLACEMENT WITH ULTRASOUND AND FLUOROSCOPIC GUIDANCE MEDICATIONS: None; ANESTHESIA/SEDATION: Moderate Sedation Time:  None The patient was continuously monitored during the procedure by the interventional radiology nurse under my direct supervision. FLUOROSCOPY TIME:  Fluoroscopy Time: 48 seconds COMPLICATIONS: None immediate. PROCEDURE: The patient was advised of the possible risks and complications and agreed to undergo the procedure. The patient was then brought to the angiographic suite for the procedure. The right arm was prepped with chlorhexidine, draped in the usual sterile fashion using maximum barrier technique (cap and mask, sterile gown, sterile gloves, large sterile sheet, hand hygiene and cutaneous antiseptic). Local anesthesia was attained by infiltration with 1% lidocaine. Ultrasound demonstrated patency of the basilic vein, and this was documented with an image. Under real-time ultrasound guidance, this vein was accessed with a 21 gauge micropuncture needle and image documentation was performed. The needle was exchanged over a guidewire for a peel-away sheath through which a 37 cm 5 French dual lumen power injectable PICC was advanced, and positioned with its tip at the lower SVC/right atrial junction. Fluoroscopy during the procedure and fluoro spot radiograph confirms appropriate catheter position. The catheter was flushed, secured to the skin with Prolene sutures, and covered with a sterile dressing. IMPRESSION: Successful placement of a right arm PICC with sonographic  and fluoroscopic guidance. The catheter is ready for use. Read by: Kevin Bruning PA-C Electronically Signed   By: John  Watts M.D.   On: 08/18/2015 13:51    Assessment/Plan 1. Left popliteal artery occlusion (HCC) S/p bypass by Dr.Todd [place] Had good flow postop Wounds appear to be healing well Cont to monitor F/u with Dr. [place] as planned in 2 wks from admission  2. PAD (peripheral artery disease) (HCC) -has atherosclerotic disease throughout her body apparently with known CAD (prior MI), PAD, carotid artery disease -cont to monitor  3. S/P BKA (below knee amputation) unilateral, right (HCC) -uses prosthesis and gets around with walker short distances and manual wheelchair otherwise--was about 3 years ago and took 1.5 years to heal she reports  4. Gangrene of toe (HCC) -second and third toes of left foot--the dead tissue is expected to fall off in time -left foot remains tender to touch -monitor  5. Diabetes mellitus type 2 with neurological manifestations (HCC) -well controlled with tresiba, januvia, actos-metformin -appropriately on ace, statin also Lab Results  Component Value Date   HGBA1C 6.6* 08/19/2015  -not on any type of blood thinners and not on allergy list so reason for no aspirin is unclear (unless stopped due to her surgery and meant to be resumed)  6. Type 2 diabetes mellitus with pressure ulcer of thigh, stage 2 (HCC) -has three pressure ulcers of left thigh--proximal and distal lateral thigh and medial thigh -cont santyl and gauze dressings here -these were assessed by wound care nursing here  Family/ staff Communication: discussed with rehab nursing  Labs/tests ordered:  F/u cbc next week (on iron)  Tiffany L. Reed, D.O. Geriatrics [facility] [address] [place], [place] [address] Cell Phone (Mon-Fri 8am-5pm):  [phone] On Call:  [phone] & follow prompts after 5pm & weekends Office Phone:   [phone] Office Fax:  [phone]    

## 2015-08-28 ENCOUNTER — Encounter: Payer: Self-pay | Admitting: Vascular Surgery

## 2015-09-01 ENCOUNTER — Encounter: Payer: Self-pay | Admitting: Vascular Surgery

## 2015-09-01 ENCOUNTER — Ambulatory Visit (INDEPENDENT_AMBULATORY_CARE_PROVIDER_SITE_OTHER): Payer: Medicare Other | Admitting: Vascular Surgery

## 2015-09-01 VITALS — BP 112/55 | HR 88 | Temp 97.1°F | Resp 14 | Ht 64.0 in | Wt 144.0 lb

## 2015-09-01 DIAGNOSIS — I7025 Atherosclerosis of native arteries of other extremities with ulceration: Secondary | ICD-10-CM

## 2015-09-01 NOTE — Progress Notes (Signed)
Here today for follow-up of her left above-knee to below-knee popliteal bypass with reversed small saphenous vein on 08/19/2015. She is currently in a skilled nursing facility. She looks quite good today. Her foot is healing. She has demarcation of her second and third toes with dry gangrenous changes. Her above-knee and below-knee popliteal incisions are healing and she has some slight separation but good Vedder Brittian healing of her vein harvest in her small saphenous.   She has a good Doppler flow at the posterior tibial dorsalis pedis and peroneal with the posterior tibial being the strongest signal.   Feel that she is doing well over all. We will see her again in 3 weeks for continued follow-up. Explained that she may require formal amputation of her second and third toes these are  Progressing to dry gangrene currentlycurrently   she does have some full-thickness ulcerations over thigh related to striking her wheelchair. She is asked for referral to the wound center for assistance in healing of these as well.

## 2015-09-03 ENCOUNTER — Non-Acute Institutional Stay (SKILLED_NURSING_FACILITY): Payer: Medicare Other | Admitting: Adult Health

## 2015-09-03 DIAGNOSIS — I70202 Unspecified atherosclerosis of native arteries of extremities, left leg: Secondary | ICD-10-CM

## 2015-09-03 DIAGNOSIS — I743 Embolism and thrombosis of arteries of the lower extremities: Secondary | ICD-10-CM | POA: Diagnosis not present

## 2015-09-03 DIAGNOSIS — I739 Peripheral vascular disease, unspecified: Secondary | ICD-10-CM | POA: Diagnosis not present

## 2015-09-03 DIAGNOSIS — D62 Acute posthemorrhagic anemia: Secondary | ICD-10-CM

## 2015-09-03 DIAGNOSIS — I10 Essential (primary) hypertension: Secondary | ICD-10-CM | POA: Diagnosis not present

## 2015-09-03 DIAGNOSIS — I96 Gangrene, not elsewhere classified: Secondary | ICD-10-CM | POA: Diagnosis not present

## 2015-09-03 DIAGNOSIS — E11622 Type 2 diabetes mellitus with other skin ulcer: Secondary | ICD-10-CM | POA: Diagnosis not present

## 2015-09-03 DIAGNOSIS — L89899 Pressure ulcer of other site, unspecified stage: Secondary | ICD-10-CM | POA: Diagnosis not present

## 2015-09-03 DIAGNOSIS — Z89511 Acquired absence of right leg below knee: Secondary | ICD-10-CM

## 2015-09-03 DIAGNOSIS — E1149 Type 2 diabetes mellitus with other diabetic neurological complication: Secondary | ICD-10-CM | POA: Diagnosis not present

## 2015-09-03 NOTE — Progress Notes (Signed)
Patient ID: [name], female   DOB: 12-02-1945, 70 y.o.   MRN: [id number]  Provider:  Royal Hawthorn NP Location:  [place] of Service:  SNF (31)  PCP: Sheela Stack, MD Patient Care Team: Reynold Bowen, MD as PCP - General Sherren Mocha, MD (Cardiology) Cindee Salt, MD as Consulting Physician (General Surgery) Kathie Rhodes, MD as Consulting Physician (Urology) Rana Snare, MD as Consulting Physician (Urology) Rosetta Posner, MD as Consulting Physician (Vascular Surgery)  Extended Emergency Contact Information Primary Emergency Contact: Idolina Primer Address: [address]          [place], [place] [address] Johnnette Litter of [place] Phone: [phone] Mobile Phone: [phone] Relation: Spouse  Code Status: Full code Goals of Care: Advanced Directive information Advanced Directives 08/25/2015  Does patient have an advance directive? Yes  Type of Advance Directive Living will;Healthcare Power of Attorney  Does patient want to make changes to advanced directive? -  Copy of advanced directive(s) in chart? Yes    Chief Complaint  Patient presents with  . Discharge Note    HPI: Patient is a 70 y.o. 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 Donnetta Hutching on 08/19/15 due to PAD with left foot gangrenous toes and known occluded left popliteal artery.  Per notes, there was a great improvement in blood flow postoperatively.   She has been working with therapy and is independent with ADL's and feels she is ready for discharge.  Her blood sugars were low in the hospital but here have been above goal in the 200-250 range until  3/1 and 3/2 where there has been improvement to 96 in the am and 154 in the pm.   Her BP has been low, in the 16'B systolic to 846.  She denies any SOB, CP, dizziness, or fatigue.  Her pain is controlled with prn demerol to the left leg. She is eating and drinking well and had a BM today. She has wound to her left thigh that have been present for 6 weeks by her account due to skin tears from transferring in the [facility]. She is using santyl to these areas. There continues to be slough tissue with no healing in place.  She has dry gangrene to the left foot on the 2nd and 3rd toes.  She is cleansing these with soap and water and applying antibiotic ointment QOD. Her follow up apt with Dr. Donnetta Hutching yesterday went well.   Past Medical History  Diagnosis Date  . Hypertension     Unspecified  . Hyperlipidemia     Mixed  . Tobacco abuse     Remote  . Loose, teeth     has loose bridge and two loose teeth holding it  . PONV (postoperative nausea and vomiting)   . Type II diabetes mellitus ([place])   . GERD (gastroesophageal reflux disease)   . Anxiety   . Peripheral vascular disease (HCC)     unspecified, a. s/p L CEA b. s/p B fem-pop bypass  . Raynaud disease   . Stones in the urinary tract   . UTI (urinary tract infection)   . Anemia     hx  . Coronary artery  disease     a. s/p NSTEMI 02/2009 - PCI LCX with Xience DES. Otherwise branch vessel and Dist. RCA dzs. NL EF.   Marland Kitchen Myocardial infarction ([place])   . CHF (congestive heart failure) ([place])   . Kidney stones   . UTI (lower urinary tract infection)   . Constipation   . Hernia, umbilical   . History of blood transfusion   . Fall from slipping on wet surface Jan. 7, 2014    Right stump bleeding from fall  . Carotid artery occlusion   . Proliferative diabetic retinopathy of right eye ([place])   . Subclavian steal syndrome   . Diabetic neuropathy with neurologic complication ([place])   . History of right below knee amputation ([place])   . Vitamin D  deficiency   . Goiter    Past Surgical History  Procedure Laterality Date  . Vesicovaginal fistula closure w/ tah    . Colonoscopy  11/2010  . Upper gastrointestinal endoscopy  11/2010  . Femoral artery - popliteal artery bypass graft      left  . Abdominal hysterectomy      complete  . Femoral-popliteal bypass graft  08/22/2011    Procedure: BYPASS GRAFT FEMORAL-POPLITEAL ARTERY;  Surgeon: Curt Jews, MD;  Location: [place];  Service: Vascular;  Laterality: Right;  Thrombectomy and Revision using 53m x 10cm stretch goretex graft  . Intraoperative arteriogram  08/22/2011    Procedure: INTRA OPERATIVE ARTERIOGRAM;  Surgeon: TCurt Jews MD;  [facility];  Service: Vascular;  Laterality: Right;  to lower leg  . Multiple tooth extractions  08-29-2011    5 teeth extracted   . Pci  01/17/12    RLE  . Breast lumpectomy      right  . Tonsillectomy and adenoidectomy    . Coronary angioplasty with stent placement    . Carotid endarterectomy  ~ 2005    left  . Aorta - bilateral femoral artery bypass graft  03/26/2012    Procedure: AORTA BIFEMORAL BYPASS GRAFT;  Surgeon: TRosetta Posner MD;  [facility];  Service: Vascular;  Laterality: N/A;  Aortic-bifemoral bypass using 14x842mHemashield graft .   . Umbilical hernia repair  03/26/2012    Procedure: HERNIA REPAIR UMBILICAL ADULT;  Surgeon: ToRosetta PosnerMD;  [facility];  Service: Vascular;  Laterality: N/A;  Removal of Umbilical hernia sac  . Femoral artery exploration  03/26/2012    Procedure: FEMORAL ARTERY EXPLORATION;  Surgeon: ToRosetta PosnerMD;  [facility];  Service: Vascular;  Laterality: Right;  with Revision of Popliteal-Peroneal bypass graft using [id number] 10cm thin wall goretex graft  . Amputation  04/23/2012    Procedure: AMPUTATION BELOW KNEE;  Surgeon: TodRosetta PosnerD;  [facility];  Laterality: Right;  . Eye surgery Right Feb. 9, 2015    Cataract  . Eye surgery Left Feb. 16, 2015    Cataract  .  Abdominal aortagram N/A 05/17/2011    Procedure: ABDOMINAL AORTAGRAM;  Surgeon: VanSerafina MitchellD;  [facility];  Service: Cardiovascular;  Laterality: N/A;  . Lower extremity angiogram  05/17/2011    Procedure: LOWER EXTREMITY ANGIOGRAM;  Surgeon: VanSerafina MitchellD;  [facility];  Service: Cardiovascular;;  . Percutaneous stent intervention Right 05/17/2011    Procedure: PERCUTANEOUS STENT INTERVENTION;  Surgeon: VanSerafina MitchellD;  [facility];  Service: Cardiovascular;  Laterality: Right;  . Lower extremity angiogram N/A  09/21/2011    Procedure: LOWER EXTREMITY ANGIOGRAM;  Surgeon: Serafina Mitchell, MD;  [facility];  Service: Cardiovascular;  Laterality: N/A;  . Abdominal angiogram  09/21/2011    Procedure: ABDOMINAL ANGIOGRAM;  Surgeon: Serafina Mitchell, MD;  [facility];  Service: Cardiovascular;;  . Abdominal aortagram N/A 01/17/2012    Procedure: ABDOMINAL Maxcine Ham;  Surgeon: Serafina Mitchell, MD;  Location[facility] CATH LAB;  Service: Cardiovascular;  Laterality: N/A;  . Left heart catheterization with coronary angiogram N/A 04/05/2012    Procedure: LEFT HEART CATHETERIZATION WITH CORONARY ANGIOGRAM;  Surgeon: Sherren Mocha, MD;  Location: [facility] CATH LAB;  Service: Cardiovascular;  Laterality: N/A;  . Peripheral vascular catheterization N/A 06/08/2015    Procedure: Abdominal Aortogram w/Lower Extremity;  Surgeon: Elam Dutch, MD;  Location: [place] CV LAB;  Service: Cardiovascular;  Laterality: N/A;  . Hernia repair    . Femoral-tibial bypass graft Left 08/19/2015    Procedure: LEFT FEMORAL-  ABOVE KNEE POPLITEAL BYPASS GRAFT (VEIN) to [place];  Surgeon: Rosetta Posner, MD;  Location: [place];  Service: Vascular;  Laterality: Left;    reports that she quit smoking about 14 years ago. Her smoking use included Cigarettes. She has a 78 pack-year smoking history. She has never used  smokeless tobacco. She reports that she drinks about 9.0 oz of alcohol per week. She reports that she does not use illicit drugs. Social History   Social History  . Marital Status: Married    Spouse Name: N/A  . Number of Children: N/A  . Years of Education: N/A   Occupational History  . Not on file.   Social History Main Topics  . Smoking status: Former Smoker -- 2.00 packs/day for 39 years    Types: Cigarettes    Quit date: 07/05/2001  . Smokeless tobacco: Never Used  . Alcohol Use: 9.0 oz/week    1 Shots of liquor, 14 Standard drinks or equivalent per week     Comment: burbon   . Drug Use: No  . Sexual Activity: Not on file   Other Topics Concern  . Not on file   Social History Narrative   Does not get regular exercise    Functional Status Survey:    Family History  Problem Relation Age of Onset  . Heart attack Mother   . Heart attack Father   . Hypertension Brother   . Diabetes Brother   . Hypertension Brother     Health Maintenance  Topic Date Due  . Hepatitis C Screening  1945/11/10  . FOOT EXAM  02/22/1955  . OPHTHALMOLOGY EXAM  02/22/1955  . DEXA SCAN  02/21/2010  . MAMMOGRAM  11/05/2015  . INFLUENZA VACCINE  02/02/2016  . HEMOGLOBIN A1C  02/16/2016  . COLONOSCOPY  11/17/2020  . TETANUS/TDAP  09/05/2021  . ZOSTAVAX  Completed  . PNA vac Low Risk Adult  Completed  S    Allergies  Allergen Reactions  . Acetaminophen Nausea Only and Other (See Comments)    Does not tolerate well, nausea  . Ceftriaxone Itching  . Codeine Nausea And Vomiting  . Erythromycin Nausea And Vomiting  . Hydrocodone-Acetaminophen Nausea And Vomiting  . Hydromorphone Nausea And Vomiting  . Propoxyphene Hcl Nausea And Vomiting  . Statins Other (See Comments)    Leg myalgias  . Eggs Or Egg-Derived Products Other (See Comments)    "pt does not eat" Unspecified reaction  . Ibuprofen  Other (See Comments)    Unspecified  Per Pt "Does not tolerate well"  .  Shellfish-Derived Products Other (See Comments)    "pt does not eat" Unspecified reaction      Medication List       This list is accurate as of: 09/03/15 12:29 PM.  Always use your most recent med list.               ACIDOPHILUS PEARLS Caps  Take 1 capsule by mouth daily.     ALPRAZolam 0.25 MG tablet  Commonly known as:  XANAX  Take 0.25 mg by mouth 2 (two) times daily.     ALTACE 1.25 MG capsule  Generic drug:  ramipril  Take 1.25 mg by mouth daily. BRAND NAME ONLY     aspirin EC 81 MG tablet  Take 1 tablet (81 mg total) by mouth daily.     Biotin 5 MG/ML Liqd  Take by mouth. 15 cc at bedtime swish and spit     calcium carbonate 500 MG chewable tablet  Commonly known as:  TUMS - dosed in mg elemental calcium  Chew 1,000 mg by mouth daily as needed for indigestion or heartburn.     DEXILANT 60 MG capsule  Generic drug:  dexlansoprazole  Take 60 mg by mouth 2 (two) times daily.     ergocalciferol 50000 units capsule  Commonly known as:  VITAMIN D2  Take 50,000 Units by mouth 3 (three) times a week.     furosemide 40 MG tablet  Commonly known as:  LASIX  Take 1 tablet (40 mg total) by mouth daily.     iron polysaccharides 150 MG capsule  Commonly known as:  NIFEREX  Take three (3) capsules (450 mg total) by mouth every morning and take four (4) capsules (600 mg total) by mouth every evening.     isosorbide mononitrate 30 MG 24 hr tablet  Commonly known as:  IMDUR  Take 30 mg by mouth daily.     meperidine 50 MG tablet  Commonly known as:  DEMEROL  Take 1-2 tablets (50-100 mg total) by mouth every 4 (four) hours as needed for severe pain.     metoprolol succinate 50 MG 24 hr tablet  Commonly known as:  TOPROL-XL  Take 50 mg by mouth daily. Take with or immediately following a meal.     nitroGLYCERIN 0.4 MG SL tablet  Commonly known as:  NITROSTAT  Place 1 tablet (0.4 mg total) under the tongue every 5 (five) minutes as needed. For chest pain      omega-3 acid ethyl esters 1 g capsule  Commonly known as:  LOVAZA  Take 2 g by mouth 2 (two) times daily.     pioglitazone-metformin 15-850 MG tablet  Commonly known as:  ACTOPLUS MET  Take 1 tablet by mouth daily. Brand name only-does not work if components are given separately per patient 15-850 MG     potassium chloride 10 MEQ tablet  Commonly known as:  K-DUR  Take 1 tablet (10 mEq total) by mouth 2 (two) times daily.     prochlorperazine 10 MG tablet  Commonly known as:  COMPAZINE  Take 1 tablet (10 mg total) by mouth every 6 (six) hours as needed. For nausea     rosuvastatin 10 MG tablet  Commonly known as:  CRESTOR  Take 10 mg by mouth every Monday.     SANTYL ointment  Generic drug:  collagenase  Apply 1 application topically daily.  sitaGLIPtin 100 MG tablet  Commonly known as:  JANUVIA  Take 100 mg by mouth at bedtime.     sodium chloride 0.65 % Soln nasal spray  Commonly known as:  OCEAN  Place 2-4 sprays into both nostrils every 4 (four) hours as needed (for nose bleeds).     sodium chloride 5 % ophthalmic solution  Commonly known as:  MURO 128  Place 1 drop into both eyes daily as needed. (lubrication)     TRESIBA FLEXTOUCH 100 UNIT/ML Sopn  Generic drug:  Insulin Degludec  Inject 16 Units into the skin daily.     ZETIA 10 MG tablet  Generic drug:  ezetimibe  TAKE 1 TABLET (10 MG TOTAL) BY MOUTH DAILY.        Review of Systems  Constitutional: Positive for activity change. Negative for fever and chills.       Due to surgery  HENT: Negative for congestion and sinus pressure.   Eyes: Negative for visual disturbance.       Wears glasses  Respiratory: Negative for chest tightness and shortness of breath.   Cardiovascular: Negative for chest pain, palpitations and leg swelling.  Gastrointestinal: Negative for nausea, abdominal pain, diarrhea and constipation.  Genitourinary: Negative for dysuria, frequency and difficulty urinating.  Musculoskeletal:        Left foot including heel tender  Skin: Positive for wound.  Allergic/Immunologic:       Diabetic  Neurological: Negative for dizziness, facial asymmetry, weakness and light-headedness.  Psychiatric/Behavioral: Negative for suicidal ideas, hallucinations, confusion, sleep disturbance and decreased concentration. The patient is not nervous/anxious.     Filed Vitals:   09/03/15 1220  BP: 106/48  Pulse: 75  Temp: 96.8 F (36 C)  Resp: 16  Weight: 144 lb 9.6 oz (65.59 kg)   Body mass index is 24.81 kg/(m^2). Physical Exam  Constitutional: She is oriented to person, place, and time. She appears well-developed and well-nourished. No distress.  HENT:  Head: Normocephalic and atraumatic.  Eyes: Conjunctivae are normal. Pupils are equal, round, and reactive to light. No scleral icterus.  Neck: Neck supple. No JVD present.  Cardiovascular: Normal rate, regular rhythm and normal heart sounds.   Pulmonary/Chest: Effort normal and breath sounds normal. No respiratory distress. She has no rales.  Abdominal: Soft. Bowel sounds are normal. She exhibits no distension and no mass. There is no tenderness. There is no rebound and no guarding.  Musculoskeletal: She exhibits edema and tenderness.  Decreased left knee extension due to posterior popliteal incision site;   Lymphadenopathy:    She has no cervical adenopathy.  Neurological: She is alert and oriented to person, place, and time. No cranial nerve deficit. Coordination normal.  Skin:  2nd and 3rd toes on the left foot are gangrenous, no odor or drainage; left leg with three incisions with steristrips securing them- no significant erythema, warmth, . Left hip, and posterior thigh with 4 wounds, all with 100% yellow tissue, surrounding erythema, no drainage or odor  Psychiatric: She has a normal mood and affect. Her behavior is normal. Judgment and thought content normal.    Labs reviewed: Basic Metabolic Panel:  Recent Labs   08/19/15 0928 08/20/15 0500 08/21/15 0524  NA 141 140 138  K 4.5 4.4 4.2  CL 104 106 103  CO2 _0 GLUCOSE 88 94 202*  BUN _1 CREATININE 1.14* 0.94 0.84  CALCIUM 9.0 8.4* 8.3*   Liver Function Tests:  Recent Labs  06/05/15 2237  08/19/15 0928  AST 14* 16  ALT 9* 10*  ALKPHOS 65 86  BILITOT 0.3 0.4  PROT 6.4* 6.3*  ALBUMIN 3.3* 2.8*   No results for input(s): LIPASE, AMYLASE in the last 8760 hours. No results for input(s): AMMONIA in the last 8760 hours. CBC:  Recent Labs  08/19/15 0928 08/20/15 0500 08/21/15 0524  WBC 9.8 6.3 6.9  HGB 9.7* 8.0* 7.7*  HCT 30.9* 25.7* 24.3*  MCV 87.8 88.9 88.0  PLT 332 292 294   Cardiac Enzymes: No results for input(s): CKTOTAL, CKMB, CKMBINDEX, TROPONINI in the last 8760 hours. BNP: Invalid input(s): POCBNP Lab Results  Component Value Date   HGBA1C 6.6* 08/19/2015   Imaging and Procedures obtained prior to SNF admission: Ir Fluoro Guide Cv Line Right  08/18/2015  INDICATION: Peripheral arterial disease. Poor venous access. Request PICC line placement in preparation for upcoming surgical procedure. EXAM: RIGHT UPPER EXTREMITY PICC LINE PLACEMENT WITH ULTRASOUND AND FLUOROSCOPIC GUIDANCE MEDICATIONS: None; ANESTHESIA/SEDATION: Moderate Sedation Time:  None The patient was continuously monitored during the procedure by the interventional radiology nurse under my direct supervision. FLUOROSCOPY TIME:  Fluoroscopy Time: 48 seconds COMPLICATIONS: None immediate. PROCEDURE: The patient was advised of the possible risks and complications and agreed to undergo the procedure. The patient was then brought to the angiographic suite for the procedure. The right arm was prepped with chlorhexidine, draped in the usual sterile fashion using maximum barrier technique (cap and mask, sterile gown, sterile gloves, large sterile sheet, hand hygiene and cutaneous antiseptic). Local anesthesia was attained by infiltration with 1% lidocaine.  Ultrasound demonstrated patency of the basilic vein, and this was documented with an image. Under real-time ultrasound guidance, this vein was accessed with a 21 gauge micropuncture needle and image documentation was performed. The needle was exchanged over a guidewire for a peel-away sheath through which a 37 cm 5 [place] dual lumen power injectable PICC was advanced, and positioned with its tip at the lower SVC/right atrial junction. Fluoroscopy during the procedure and fluoro spot radiograph confirms appropriate catheter position. The catheter was flushed, secured to the skin with Prolene sutures, and covered with a sterile dressing. IMPRESSION: Successful placement of a right arm PICC with sonographic and fluoroscopic guidance. The catheter is ready for use. Read by: Ascencion Dike PA-C Electronically Signed   By: Sandi Mariscal M.D.   On: 08/18/2015 13:51   Ir US Guide Vasc Access Right  08/18/2015  INDICATION: Peripheral arterial disease. Poor venous access. Request PICC line placement in preparation for upcoming surgical procedure. EXAM: RIGHT UPPER EXTREMITY PICC LINE PLACEMENT WITH ULTRASOUND AND FLUOROSCOPIC GUIDANCE MEDICATIONS: None; ANESTHESIA/SEDATION: Moderate Sedation Time:  None The patient was continuously monitored during the procedure by the interventional radiology nurse under my direct supervision. FLUOROSCOPY TIME:  Fluoroscopy Time: 48 seconds COMPLICATIONS: None immediate. PROCEDURE: The patient was advised of the possible risks and complications and agreed to undergo the procedure. The patient was then brought to the angiographic suite for the procedure. The right arm was prepped with chlorhexidine, draped in the usual sterile fashion using maximum barrier technique (cap and mask, sterile gown, sterile gloves, large sterile sheet, hand hygiene and cutaneous antiseptic). Local anesthesia was attained by infiltration with 1% lidocaine. Ultrasound demonstrated patency of the basilic vein, and  this was documented with an image. Under real-time ultrasound guidance, this vein was accessed with a 21 gauge micropuncture needle and image documentation was performed. The needle was exchanged over a guidewire for a peel-away sheath through which a  37 cm 5 [place] dual lumen power injectable PICC was advanced, and positioned with its tip at the lower SVC/right atrial junction. Fluoroscopy during the procedure and fluoro spot radiograph confirms appropriate catheter position. The catheter was flushed, secured to the skin with Prolene sutures, and covered with a sterile dressing. IMPRESSION: Successful placement of a right arm PICC with sonographic and fluoroscopic guidance. The catheter is ready for use. Read by: Ascencion Dike PA-C Electronically Signed   By: Sandi Mariscal M.D.   On: 08/18/2015 13:51    Assessment/Plan 1. Left popliteal artery occlusion (HCC) S/p bypass by Dr.Todd Early Had good flow postop Incision is healing well F/U up with Dr. Donnetta Hutching in  3 weeks  2. PAD (peripheral artery disease) (HCC) -has atherosclerotic disease throughout her body apparently with known CAD (prior MI), PAD, carotid artery disease -currently on asa, zetia, lovaza, crestor  3. S/P BKA (below knee amputation) unilateral, right ([place]) -uses prosthesis and gets around with walker short distances and manual wheelchair o -would benefit from continued PT and PT  4. Gangrene of toe (HCC) -second and third toes of left foot -most recent note from Dr. Donnetta Hutching indicate that she may need a formal amputation, f/u in 3 week at his office -continue to cleanse feet with soap, apply antibiotic ointment, and keep dry  5. Diabetes mellitus type 2 with neurological manifestations ([place]) -improved CBGs -continue current meds, ace and statin Lab Results  Component Value Date   HGBA1C 6.6* 08/19/2015   6. Type 2 diabetes mellitus with pressure ulcer of thigh, stage 2 (HCC) -has three pressure ulcers of left thigh--proximal  and distal lateral thigh and medial thigh -cont santyl and gauze dressings here -no improvement in wounds during her stay but no signs of infection -should follow up with the wound care center, has apt on 3/15   7. HTN -BP reported low here in the 88-916 range systolic  -decrease Toprol to 50 mg qd and follow up with Dr. Forde Dandy -needs to be checked in both arms due to hx of subclavian steel syndrome  8. Anemia -received 2 units of PRBCs in the hospital -no outward signs of bleeding, most likely due to acute blood loss from surgery and underlying medical issues -resident refused CBC so will need to follow up with PCP about this   Family/ staff Communication: discussed with rehab nursing  Medically she is ready for discharge but will need to follow up with per PCP, vascular surgeon, and the wound care center.  After discussing her case with the therapy dept there are a few issues concerning her home and her wheelchair that need to be worked out, so her expected d/c date is 3/6.  In addition, her husband is her primary care taker and had the flu earlier in the week. It seems prudent to wait until he is in better health and can assist her.   I spent >30 min on this discharge, >50% in coordination/counseling.  Cindi Carbon, ANP [facility] [phone]

## 2015-09-04 DIAGNOSIS — I10 Essential (primary) hypertension: Secondary | ICD-10-CM | POA: Insufficient documentation

## 2015-09-07 ENCOUNTER — Ambulatory Visit (INDEPENDENT_AMBULATORY_CARE_PROVIDER_SITE_OTHER): Payer: Medicare Other | Admitting: Family

## 2015-09-07 ENCOUNTER — Encounter: Payer: Self-pay | Admitting: Family

## 2015-09-07 VITALS — BP 113/61 | HR 89 | Temp 97.5°F | Ht 64.0 in | Wt 144.0 lb

## 2015-09-07 DIAGNOSIS — I779 Disorder of arteries and arterioles, unspecified: Secondary | ICD-10-CM

## 2015-09-07 DIAGNOSIS — T8131XA Disruption of external operation (surgical) wound, not elsewhere classified, initial encounter: Secondary | ICD-10-CM

## 2015-09-07 DIAGNOSIS — Z9889 Other specified postprocedural states: Secondary | ICD-10-CM

## 2015-09-07 DIAGNOSIS — I96 Gangrene, not elsewhere classified: Secondary | ICD-10-CM

## 2015-09-07 DIAGNOSIS — Z9862 Peripheral vascular angioplasty status: Secondary | ICD-10-CM

## 2015-09-07 NOTE — Progress Notes (Signed)
[facility] [place] HISTORY AND PHYSICAL -PAD  History of Present Illness [name] is a 70 y.o. 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 He last saw pt on 09/01/15.  At that time she was in a skilled nursing facility.  Her foot was healing. She had demarcation of her second and third toes with dry gangrenous changes. Her above-knee and below-knee popliteal incisions were healing and she had some slight separation but good early healing of her vein harvest in her small saphenous. She had a good Doppler flow at the posterior tibial dorsalis pedis and peroneal with the posterior tibial being the strongest signal. Dr. Donnetta Hutching felt that she was doing well over all. Pt was to return in 3 weeks for continued follow-up. Explained that she may require formal amputation of her second and third toes if they do not auto-amputate.  Dr. Donnetta Hutching referred pt to the wound care center at the 09/01/15 visit; these visits have not yet started.   She has some full-thickness ulcerations over thigh related to striking her wheelchair.   Pt returns today due to report from [place] that pt has several open areas on incision by her left knee. Husband questions whether the incision separation is due to activity of physical therapy.  Pt walks some with her walker.  Pt denies any stroke or TIA history.  Pt is wearing right BKA prosthesis.  Pt Diabetic: Yes, last A1C was 6.6 in February 2017 (review of records), has improved Pt smoker: former smoker, quit in 2003   Pt meds include:  Statin :Yes, takes 10 mg Crestor once/week due to myalgias  ASA: Yes  Other anticoagulants/antiplatelets: Plavix    Past Medical History  Diagnosis Date  . Hypertension     Unspecified  . Hyperlipidemia     Mixed  . Tobacco abuse     Remote  . Loose, teeth     has loose bridge and two loose  teeth holding it  . PONV (postoperative nausea and vomiting)   . Type II diabetes mellitus ([place])   . GERD (gastroesophageal reflux disease)   . Anxiety   . Peripheral vascular disease (HCC)     unspecified, a. s/p L CEA b. s/p B fem-pop bypass  . Raynaud disease   . Stones in the urinary tract   . UTI (urinary tract infection)   . Anemia     hx  . Coronary artery disease     a. s/p NSTEMI 02/2009 - PCI LCX with Xience DES. Otherwise branch vessel and Dist. RCA dzs. NL EF.   Marland Kitchen Myocardial infarction ([place])   . CHF (congestive heart failure) ([place])   . Kidney stones   . UTI (lower urinary tract infection)   . Constipation   . Hernia, umbilical   . History of blood transfusion   . Fall from slipping on wet surface Jan. 7, 2014    Right stump bleeding from fall  . Carotid artery occlusion   . Proliferative diabetic retinopathy of right eye ([place])   . Subclavian steal syndrome   . Diabetic neuropathy with neurologic complication ([place])   . History of right below knee amputation ([place])   . Vitamin D deficiency   . Goiter     Social History Social History  Substance Use Topics  . Smoking status: Former Smoker -- 2.00 packs/day for 39 years    Types: Cigarettes  Quit date: 07/05/2001  . Smokeless tobacco: Never Used  . Alcohol Use: 9.0 oz/week    1 Shots of liquor, 14 Standard drinks or equivalent per week     Comment: burbon     Family History Family History  Problem Relation Age of Onset  . Heart attack Mother   . Heart attack Father   . Hypertension Brother   . Diabetes Brother   . Hypertension Brother     Past Surgical History  Procedure Laterality Date  . Vesicovaginal fistula closure w/ tah    . Colonoscopy  11/2010  . Upper gastrointestinal endoscopy  11/2010  . Femoral artery - popliteal artery bypass graft      left  . Abdominal hysterectomy      complete  . Femoral-popliteal bypass graft  08/22/2011    Procedure: BYPASS GRAFT FEMORAL-POPLITEAL ARTERY;   Surgeon: Curt Jews, MD;  Location: [facility];  Service: Vascular;  Laterality: Right;  Thrombectomy and Revision using 44m x 10cm stretch goretex graft  . Intraoperative arteriogram  08/22/2011    Procedure: INTRA OPERATIVE ARTERIOGRAM;  Surgeon: TCurt Jews MD;  [facility];  Service: V[facility];  Laterality: Right;  to lower leg  . Multiple tooth extractions  08-29-2011    5 teeth extracted   . Pci  01/17/12    RLE  . Breast lumpectomy      right  . Tonsillectomy and adenoidectomy    . Coronary angioplasty with stent placement    . Carotid endarterectomy  ~ 2005    left  . Aorta - bilateral femoral artery bypass graft  03/26/2012    Procedure: AORTA BIFEMORAL BYPASS GRAFT;  Surgeon: TRosetta Posner MD;  [facility];  Service: Vascular;  Laterality: N/A;  Aortic-bifemoral bypass using [id number] graft .   . Umbilical hernia repair  03/26/2012    Procedure: HERNIA REPAIR UMBILICAL ADULT;  Surgeon: ToRosetta PosnerMD;  [facility];  Service: Vascular;  Laterality: N/A;  Removal of Umbilical hernia sac  . Femoral artery exploration  03/26/2012    Procedure: FEMORAL ARTERY EXPLORATION;  Surgeon: ToRosetta PosnerMD;  [facility];  Service: Vascular;  Laterality: Right;  with Revision of Popliteal-Peroneal bypass graft using 64m23m 10cm thin wall goretex graft  . Amputation  04/23/2012    Procedure: AMPUTATION BELOW KNEE;  Surgeon: TodRosetta PosnerD;  [facility];  Laterality: Right;  . Eye surgery Right Feb. 9, 2015    Cataract  . Eye surgery Left Feb. 16, 2015    Cataract  . Abdominal aortagram N/A 05/17/2011    Procedure: ABDOMINAL AORTAGRAM;  Surgeon: VanSerafina MitchellD;  [facility];  Service: Cardiovascular;  Laterality: N/A;  . Lower extremity angiogram  05/17/2011    Procedure: LOWER EXTREMITY ANGIOGRAM;  Surgeon: VanSerafina MitchellD;  [facility];  Service: Cardiovascular;;  . Percutaneous stent intervention Right 05/17/2011     Procedure: PERCUTANEOUS STENT INTERVENTION;  Surgeon: VanSerafina MitchellD;  [facility];  Service: Cardiovascular;  Laterality: Right;  . Lower extremity angiogram N/A 09/21/2011    Procedure: LOWER EXTREMITY ANGIOGRAM;  Surgeon: VanSerafina MitchellD;  [facility];  Service: Cardiovascular;  Laterality: N/A;  . Abdominal angiogram  09/21/2011    Procedure: ABDOMINAL ANGIOGRAM;  Surgeon: VanSerafina MitchellD;  [facility];  Service: Cardiovascular;;  . Abdominal aortagram N/A 01/17/2012    Procedure:  ABDOMINAL AORTAGRAM;  Surgeon: Serafina Mitchell, MD;  [facility];  Service: Cardiovascular;  Laterality: N/A;  . Left heart catheterization with coronary angiogram N/A 04/05/2012    Procedure: LEFT HEART CATHETERIZATION WITH CORONARY ANGIOGRAM;  Surgeon: Sherren Mocha, MD;  Location[facility];  Service: Cardiovascular;  Laterality: N/A;  . Peripheral vascular catheterization N/A 06/08/2015    Procedure: Abdominal Aortogram w/Lower Extremity;  Surgeon: Elam Dutch, MD;  Location: [facility] CV LAB;  Service: Cardiovascular;  Laterality: N/A;  . Hernia repair    . Femoral-tibial bypass graft Left 08/19/2015    Procedure: LEFT FEMORAL-  ABOVE KNEE POPLITEAL BYPASS GRAFT (VEIN) to [place];  Surgeon: Rosetta Posner, MD;  Location: [facility];  Service: Vascular;  Laterality: Left;    Allergies  Allergen Reactions  . Acetaminophen Nausea Only and Other (See Comments)    Does not tolerate well, nausea  . Ceftriaxone Itching  . Codeine Nausea And Vomiting  . Erythromycin Nausea And Vomiting  . Hydrocodone-Acetaminophen Nausea And Vomiting  . Hydromorphone Nausea And Vomiting  . Propoxyphene Hcl Nausea And Vomiting  . Statins Other (See Comments)    Leg myalgias  . Eggs Or Egg-Derived Products Other (See Comments)    "pt does not eat" Unspecified reaction  . Ibuprofen Other (See Comments)    Unspecified   Per Pt "Does not tolerate well"  . Shellfish-Derived Products Other (See Comments)    "pt does not eat" Unspecified reaction    Current Outpatient Prescriptions  Medication Sig Dispense Refill  . ALPRAZolam (XANAX) 0.25 MG tablet Take 0.25 mg by mouth 2 (two) times daily.    Marland Kitchen aspirin EC 81 MG tablet Take 1 tablet (81 mg total) by mouth daily.    . Biotin 5 MG/ML LIQD Take by mouth. 15 cc at bedtime swish and spit    . calcium carbonate (TUMS - DOSED IN MG ELEMENTAL CALCIUM) 500 MG chewable tablet Chew 1,000 mg by mouth daily as needed for indigestion or heartburn.    . collagenase (SANTYL) ointment Apply 1 application topically daily.    Marland Kitchen dexlansoprazole (DEXILANT) 60 MG capsule Take 60 mg by mouth 2 (two) times daily.    . ergocalciferol (VITAMIN D2) 50000 units capsule Take 50,000 Units by mouth 3 (three) times a week.    . furosemide (LASIX) 40 MG tablet Take 1 tablet (40 mg total) by mouth daily. 30 tablet 1  . Insulin Degludec (TRESIBA FLEXTOUCH) 100 UNIT/ML SOPN Inject 16 Units into the skin daily.    . iron polysaccharides (NIFEREX) 150 MG capsule Take three (3) capsules (450 mg total) by mouth every morning and take four (4) capsules (600 mg total) by mouth every evening.    . isosorbide mononitrate (IMDUR) 30 MG 24 hr tablet Take 30 mg by mouth daily.    . meperidine (DEMEROL) 50 MG tablet Take 1-2 tablets (50-100 mg total) by mouth every 4 (four) hours as needed for severe pain. (Patient taking differently: Take 50 mg by mouth. Take one tablets every 4 hours as needed for pain (1-5);  Take two tablets every 4 hours as needed for pain (6-10)) 60 tablet 0  . metoprolol succinate (TOPROL-XL) 50 MG 24 hr tablet Take 50 mg by mouth daily. Take with or immediately following a meal.    . nitroGLYCERIN (NITROSTAT) 0.4 MG SL tablet Place 1 tablet (0.4 mg total) under the tongue every 5 (five) minutes as  needed. For chest pain 25 tablet 2  . omega-3 acid ethyl esters (LOVAZA) 1 G capsule  Take 2 g by mouth 2 (two) times daily.     . pioglitazone-metformin (ACTOPLUS MET) 15-850 MG per tablet Take 1 tablet by mouth daily. Brand name only-does not work if components are given separately per patient 15-850 MG    . potassium chloride (K-DUR) 10 MEQ tablet Take 1 tablet (10 mEq total) by mouth 2 (two) times daily. 60 tablet 1  . Probiotic Product (ACIDOPHILUS PEARLS) CAPS Take 1 capsule by mouth daily.    . prochlorperazine (COMPAZINE) 10 MG tablet Take 1 tablet (10 mg total) by mouth every 6 (six) hours as needed. For nausea 30 tablet 2  . ramipril (ALTACE) 1.25 MG capsule Take 1.25 mg by mouth daily. BRAND NAME ONLY    . rosuvastatin (CRESTOR) 10 MG tablet Take 10 mg by mouth every Monday.     . sitaGLIPtan (JANUVIA) 100 MG tablet Take 100 mg by mouth at bedtime.     . sodium chloride (MURO 128) 5 % ophthalmic solution Place 1 drop into both eyes daily as needed. (lubrication)    . sodium chloride (OCEAN) 0.65 % SOLN nasal spray Place 2-4 sprays into both nostrils every 4 (four) hours as needed (for nose bleeds).    Marland Kitchen ZETIA 10 MG tablet TAKE 1 TABLET (10 MG TOTAL) BY MOUTH DAILY. 90 tablet 0   No current facility-administered medications for this visit.    ROS: See HPI for pertinent positives and negatives.   Physical Examination  Filed Vitals:   09/07/15 1415  BP: 113/61  Pulse: 89  Temp: 97.5 F (36.4 C)  TempSrc: Oral  Height: 5' 4"  (1.626 m)  Weight: 144 lb (65.318 kg)  SpO2: 98%   Body mass index is 24.71 kg/(m^2).  General: A&O x 3, WDWN. Gait: not observed, pt arrived in wheelchair Eyes: PERRLA. Pulmonary: non labored respirations Cardiac: regular rhythm and rate.         Carotid Bruits Right Left   Negative Positive  Aorta is not palpable.  Radial pulses: absent left radial and ulnar , 1+ left brachial; 1+ right radial and ulnar, 2+ right brachial                         VASCULAR EXAM: Extremities with ischemic changes: dry gangrene with demarcation  of her second and third toes   Separation of incision at posterior aspect left leg; this is the vein harvest site; no erythema, no drainage. Right BKA prosthesis in place.                                                                                                           LE Pulses Right Left       FEMORAL  2+ palpable  3+ palpable        POPLITEAL  not palpable   not palpable       POSTERIOR TIBIAL  BKA   + Doppler signal  DORSALIS PEDIS      ANTERIOR TIBIAL BKA  + Doppler signal       PERONEAL BKA  + Doppler signal   Abdomen: soft, NT, no palpable masses. Skin: no rashes, see Extremities Musculoskeletal: moving all extremities WNL, see Extremities.  Neurologic: A&O X 3; Appropriate Affect  motor strength 5/5 in UE's, 4/5 in LE's. Speech is fluent/normal. CN 2-12 intact.   ASSESSMENT: [name] is a 70 y.o. female who is s/p left above-knee to below-knee popliteal bypass with reversed small saphenous vein on 08/19/2015. She has a separation of the incision at the vein harvest site, at posterior aspect of left leg, no signs of infection, no drainage.  She has demarcation of her second and third toes of her left foot with dry gangrenous changes. Left foot with audible Doppler signals at DP, PT, and peroneal arteries.  She has a right BKA and is wearing her prosthesis.   Her AIC improved to 6.6 last month and she has not used tobacco since 2003.  Dr. Trula Slade spoke with pt and husband and examined pt.  See Plan.   PLAN:  Based on the patient's vascular studies and examination, pt will return to clinic for 3 week follow up with Dr. Donnetta Hutching as scheduled on 09/22/15, wound care center as scheduled on 09/18/15. Pt may go home from [facility]. HH to oversee daily wound packing (NS wet to dry), dressing changes, and wound evaluation of left posterior leg open incision.  I discussed in depth with the patient the nature of atherosclerosis, and emphasized the importance of  maximal medical management including strict control of blood pressure, blood glucose, and lipid levels, obtaining regular exercise, and cessation of smoking.  The patient is aware that without maximal medical management the underlying atherosclerotic disease process will progress, limiting the benefit of any interventions.  The patient was given information about PAD including signs, symptoms, treatment, what symptoms should prompt the patient to seek immediate medical care, and risk reduction measures to take.  Clemon Chambers, RN, MSN, FNP-C [facility] Phone: [phone]  Clinic MD: Trula Slade  09/07/2015 2:57 PM

## 2015-09-07 NOTE — Patient Instructions (Signed)
Peripheral Vascular Disease Peripheral vascular disease (PVD) is a disease of the blood vessels that are not part of your heart and brain. A simple term for PVD is poor circulation. In most cases, PVD narrows the blood vessels that carry blood from your heart to the rest of your body. This can result in a decreased supply of blood to your arms, legs, and internal organs, like your stomach or kidneys. However, it most often affects a person's lower legs and feet. There are two types of PVD.  Organic PVD. This is the more common type. It is caused by damage to the structure of blood vessels.  Functional PVD. This is caused by conditions that make blood vessels contract and tighten (spasm). Without treatment, PVD tends to get worse over time. PVD can also lead to acute ischemic limb. This is when an arm or limb suddenly has trouble getting enough blood. This is a medical emergency. CAUSES Each type of PVD has many different causes. The most common cause of PVD is buildup of a fatty material (plaque) inside of your arteries (atherosclerosis). Small amounts of plaque can break off from the walls of the blood vessels and become lodged in a smaller artery. This blocks blood flow and can cause acute ischemic limb. Other common causes of PVD include:  Blood clots that form inside of blood vessels.  Injuries to blood vessels.  Diseases that cause inflammation of blood vessels or cause blood vessel spasms.  Health behaviors and health history that increase your risk of developing PVD. RISK FACTORS  You may have a greater risk of PVD if you:  Have a family history of PVD.  Have certain medical conditions, including:  High cholesterol.  Diabetes.  High blood pressure (hypertension).  Coronary heart disease.  Past problems with blood clots.  Past injury, such as burns or a broken bone. These may have damaged blood vessels in your limbs.  Buerger disease. This is caused by inflamed blood  vessels in your hands and feet.  Some forms of arthritis.  Rare birth defects that affect the arteries in your legs.  Use tobacco.  Do not get enough exercise.  Are obese.  Are age 50 or older. SIGNS AND SYMPTOMS  PVD may cause many different symptoms. Your symptoms depend on what part of your body is not getting enough blood. Some common signs and symptoms include:  Cramps in your lower legs. This may be a symptom of poor leg circulation (claudication).  Pain and weakness in your legs while you are physically active that goes away when you rest (intermittent claudication).  Leg pain when at rest.  Leg numbness, tingling, or weakness.  Coldness in a leg or foot, especially when compared with the other leg.  Skin or hair changes. These can include:  Hair loss.  Shiny skin.  Pale or bluish skin.  Thick toenails.  Inability to get or maintain an erection (erectile dysfunction). People with PVD are more prone to developing ulcers and sores on their toes, feet, or legs. These may take longer than normal to heal. DIAGNOSIS Your health care provider may diagnose PVD from your signs and symptoms. The health care provider will also do a physical exam. You may have tests to find out what is causing your PVD and determine its severity. Tests may include:  Blood pressure recordings from your arms and legs and measurements of the strength of your pulses (pulse volume recordings).  Imaging studies using sound waves to take pictures of   the blood flow through your blood vessels (Doppler ultrasound).  Injecting a dye into your blood vessels before having imaging studies using:  X-rays (angiogram or arteriogram).  Computer-generated X-rays (CT angiogram).  A powerful electromagnetic field and a computer (magnetic resonance angiogram or MRA). TREATMENT Treatment for PVD depends on the cause of your condition and the severity of your symptoms. It also depends on your age. Underlying  causes need to be treated and controlled. These include long-lasting (chronic) conditions, such as diabetes, high cholesterol, and high blood pressure. You may need to first try making lifestyle changes and taking medicines. Surgery may be needed if these do not work. Lifestyle changes may include:  Quitting smoking.  Exercising regularly.  Following a low-fat, low-cholesterol diet. Medicines may include:  Blood thinners to prevent blood clots.  Medicines to improve blood flow.  Medicines to improve your blood cholesterol levels. Surgical procedures may include:  A procedure that uses an inflated balloon to open a blocked artery and improve blood flow (angioplasty).  A procedure to put in a tube (stent) to keep a blocked artery open (stent implant).  Surgery to reroute blood flow around a blocked artery (peripheral bypass surgery).  Surgery to remove dead tissue from an infected wound on the affected limb.  Amputation. This is surgical removal of the affected limb. This may be necessary in cases of acute ischemic limb that are not improved through medical or surgical treatments. HOME CARE INSTRUCTIONS  Take medicines only as directed by your health care provider.  Do not use any tobacco products, including cigarettes, chewing tobacco, or electronic cigarettes. If you need help quitting, ask your health care provider.  Lose weight if you are overweight, and maintain a healthy weight as directed by your health care provider.  Eat a diet that is low in fat and cholesterol. If you need help, ask your health care provider.  Exercise regularly. Ask your health care provider to suggest some good activities for you.  Use compression stockings or other mechanical devices as directed by your health care provider.  Take good care of your feet.  Wear comfortable shoes that fit well.  Check your feet often for any cuts or sores. SEEK MEDICAL CARE IF:  You have cramps in your legs  while walking.  You have leg pain when you are at rest.  You have coldness in a leg or foot.  Your skin changes.  You have erectile dysfunction.  You have cuts or sores on your feet that are not healing. SEEK IMMEDIATE MEDICAL CARE IF:  Your arm or leg turns cold and blue.  Your arms or legs become red, warm, swollen, painful, or numb.  You have chest pain or trouble breathing.  You suddenly have weakness in your face, arm, or leg.  You become very confused or lose the ability to speak.  You suddenly have a very bad headache or lose your vision.   This information is not intended to replace advice given to you by your health care provider. Make sure you discuss any questions you have with your health care provider.   Document Released: 07/28/2004 Document Revised: 07/11/2014 Document Reviewed: 11/28/2013 [employer]vier Interactive Patient Education 2016 [employer]  

## 2015-09-08 ENCOUNTER — Other Ambulatory Visit: Payer: Self-pay | Admitting: Internal Medicine

## 2015-09-08 ENCOUNTER — Encounter: Payer: Self-pay | Admitting: Internal Medicine

## 2015-09-08 ENCOUNTER — Non-Acute Institutional Stay (SKILLED_NURSING_FACILITY): Payer: Medicare Other | Admitting: Internal Medicine

## 2015-09-08 DIAGNOSIS — L89892 Pressure ulcer of other site, stage 2: Secondary | ICD-10-CM

## 2015-09-08 DIAGNOSIS — I743 Embolism and thrombosis of arteries of the lower extremities: Secondary | ICD-10-CM

## 2015-09-08 DIAGNOSIS — D62 Acute posthemorrhagic anemia: Secondary | ICD-10-CM

## 2015-09-08 DIAGNOSIS — I70202 Unspecified atherosclerosis of native arteries of extremities, left leg: Secondary | ICD-10-CM

## 2015-09-08 DIAGNOSIS — I739 Peripheral vascular disease, unspecified: Secondary | ICD-10-CM | POA: Diagnosis not present

## 2015-09-08 DIAGNOSIS — E11622 Type 2 diabetes mellitus with other skin ulcer: Secondary | ICD-10-CM

## 2015-09-08 DIAGNOSIS — I10 Essential (primary) hypertension: Secondary | ICD-10-CM

## 2015-09-08 DIAGNOSIS — I96 Gangrene, not elsewhere classified: Secondary | ICD-10-CM | POA: Diagnosis not present

## 2015-09-08 MED ORDER — METOPROLOL SUCCINATE ER 25 MG PO TB24: 25.0000 mg | ORAL_TABLET | Freq: Every day | ORAL | Status: DC

## 2015-09-08 MED ORDER — METOPROLOL SUCCINATE ER 50 MG PO TB24: 50.0000 mg | ORAL_TABLET | Freq: Every day | ORAL | Status: DC

## 2015-09-08 NOTE — Progress Notes (Signed)
Patient ID: [name], female   DOB: 09-09-1945, 70 y.o.   MRN: [id number]  Location:  [place] Room Number: [place] of Service:  SNF (31)  Provider: Giani Betzold L. Mariea Clonts, D.O., C.M.D.  PCP: Sheela Stack, MD Patient Care Team: Reynold Bowen, MD as PCP - General Sherren Mocha, MD (Cardiology) Cindee Salt, MD as Consulting Physician (General Surgery) Kathie Rhodes, MD as Consulting Physician (Urology) Rana Snare, MD as Consulting Physician (Urology) Rosetta Posner, MD as Consulting Physician (Vascular Surgery)  Extended Emergency Contact Information Primary Emergency Contact: Idolina Primer Address: [address]          [place], [place] [address] Johnnette Litter of [place] Phone: [phone] Mobile Phone: [phone] Relation: Spouse  Code Status: Full code Goals of care:  Advanced Directive information Advanced Directives 09/08/2015  Does patient have an advance directive? Yes  Type of Paramedic of [place];Living will  Copy of advanced directive(s) in chart? Yes   Allergies  Allergen Reactions  . Acetaminophen Nausea Only and Other (See Comments)    Does not tolerate well, nausea  . Ceftriaxone Itching  . Codeine Nausea And Vomiting  . Erythromycin Nausea And Vomiting  . Hydrocodone-Acetaminophen Nausea And Vomiting  . Hydromorphone Nausea And Vomiting  . Propoxyphene Hcl Nausea And Vomiting  . Statins Other (See Comments)    Leg myalgias  . Eggs Or Egg-Derived Products Other (See Comments)    "pt does not eat" Unspecified reaction  . Ibuprofen Other (See Comments)    Unspecified  Per Pt "Does not tolerate well"  . Shellfish-Derived Products Other (See Comments)    "pt does not eat" Unspecified reaction    Chief Complaint  Patient presents with  . Discharge Note    HPI:  70 y.o. female vasculopath here for rehab after vascular surgery (left femoral-tibial bypass graft 08/19/15).   Review of hospitalization:    Patient is a 70 y.o. female vasculopath with h/o DMII (x10-15 years per pt) with neuropathy, PVD with prior R BKA (2013, wears prosthesis and uses wheelchair primarily to get around, but can do short distances with walker), CAD, carotid artery disease, chronic mixed systolic and diastolic chf, prior MI.  She is s/p hospitalization with bypass by Dr. Donnetta Hutching on 08/19/15 due to PAD with left foot gangrenous toes and known occluded left popliteal artery. Per notes, there was a great improvement in blood flow postoperatively. Her stay was fairly uncomplicated with an episode of hypoglycemia on POD #1 (was kept off insulin until today when tresiba was resumed) and thigh pressure ulcers that were treated with santyl and foam dressings. Her acute blood loss anemia was minor and she was placed on iron, but did not require a transfusion.  Since arrival here, she has done well. She is wearing her prosthesis today and working with therapy. Her bypass surgical sites are clean, dry and w/o erythema, warmth or drainage. She is getting NS wet to dry dressings with gauze packing to the one just behind her left knee.  Her thigh ulcers remain and are being treated here with santyl and gauze dressings now. These were felt to be due to her wheelchair brakes and rapid transfers with shearing action. She still has her two gangrenous toes on the left foot which are black and dry. She is feeling well and tolerating her pain medication--using less now. Bowels are moving. She also has a fungal fingernail that is being soaked with dial soap. Her glucose  readings were high until her insulin was resumed 2/21. She is eating and drinking well. Her nausea has resolved.  She may now shower.  She is to f/u with Dr. Donnetta Hutching 3/21, [facility] 3/13 and call vascular with any concerns of wounds and circulation.    Of note, I increased her metoprolol succinate today to 71m po daily for better bp and pulse control.   She scored 30/30 on her mmse and passed the clock drawing.  She is now doing her cbgs in am and dinnertime only.      Past Medical History  Diagnosis Date  . Hypertension     Unspecified  . Hyperlipidemia     Mixed  . Tobacco abuse     Remote  . Loose, teeth     has loose bridge and two loose teeth holding it  . PONV (postoperative nausea and vomiting)   . Type II diabetes mellitus ([place]   . GERD (gastroesophageal reflux disease)   . Anxiety   . Peripheral vascular disease (HCC)     unspecified, a. s/p L CEA b. s/p B fem-pop bypass  . Raynaud disease   . Stones in the urinary tract   . UTI (urinary tract infection)   . Anemia     hx  . Coronary artery disease     a. s/p NSTEMI 02/2009 - PCI LCX with Xience DES. Otherwise branch vessel and Dist. RCA dzs. NL EF.   .Marland KitchenMyocardial infarction (HPrince of Wales[place]Hy[place]   . CHF (congestive heart failure) ([place]   . Kidney stones   . UTI (lower urinary tract infection)   . Constipation   . Hernia, umbilical   . History of blood transfusion   . Fall from slipping on wet surface Jan. 7, 2014    Right stump bleeding from fall  . Carotid artery occlusion   . Proliferative diabetic retinopathy of right eye (HW[place]   . Subclavian steal syndrome   . Diabetic neuropathy with neurologic complication (HCant[place]   . History of right below knee amputation (HFerrum   . Vitamin D deficiency   . Goiter     Past Surgical History  Procedure Laterality Date  . Vesicovaginal fistula closure w/ tah    . Colonoscopy  11/2010  . Upper gastrointestinal endoscopy  11/2010  . Femoral artery - popliteal artery bypass graft      left  . Abdominal hysterectomy      complete  . Femoral-popliteal bypass graft  08/22/2011    Procedure: BYPASS GRAFT FEMORAL-POPLITEAL ARTERY;  Surgeon: TCurt Jews MD;  [place]  Service: Vascular;  Laterality: Right;  Thrombectomy and Revision using 758mx 10cm stretch goretex graft  . Intraoperative arteriogram  08/22/2011    Procedure: INTRA  OPERATIVE ARTERIOGRAM;  Surgeon: ToCurt JewsMD;  [facility];  Service: Vascular;  Laterality: Right;  to lower leg  . Multiple tooth extractions  08-29-2011    5 teeth extracted   . Pci  01/17/12    RLE  . Breast lumpectomy      right  . Tonsillectomy and adenoidectomy    . Coronary angioplasty with stent placement    . Carotid endarterectomy  ~ 2005    left  . Aorta - bilateral femoral artery bypass graft  03/26/2012    Procedure: AORTA BIFEMORAL BYPASS GRAFT;  Surgeon: ToRosetta PosnerMD;  [facility];  Service: Vascular;  Laterality: N/A;  Aortic-bifemoral bypass using 14x8m72memashield graft .   . Umbilical hernia repair  03/26/2012    Procedure: HERNIA REPAIR UMBILICAL ADULT;  Surgeon: Rosetta Posner, MD;  Location: [facility] OR;  Service: Vascular;  Laterality: N/A;  Removal of Umbilical hernia sac  . Femoral artery exploration  03/26/2012    Procedure: FEMORAL ARTERY EXPLORATION;  Surgeon: Rosetta Posner, MD;  Location: [facility] OR;  Service: Vascular;  Laterality: Right;  with Revision of Popliteal-Peroneal bypass graft using 32m x 10cm thin wall goretex graft  . Amputation  04/23/2012    Procedure: AMPUTATION BELOW KNEE;  Surgeon: TRosetta Posner MD;  [place]  Service: Vascular;  Laterality: Right;  . Eye surgery Right Feb. 9, 2015    Cataract  . Eye surgery Left Feb. 16, 2015    Cataract  . Abdominal aortagram N/A 05/17/2011    Procedure: ABDOMINAL AORTAGRAM;  Surgeon: VSerafina Mitchell MD;  [facility];  Service: Cardiovascular;  Laterality: N/A;  . Lower extremity angiogram  05/17/2011    Procedure: LOWER EXTREMITY ANGIOGRAM;  Surgeon: VSerafina Mitchell MD;  [facility];  Service: Cardiovascular;;  . Percutaneous stent intervention Right 05/17/2011    Procedure: PERCUTANEOUS STENT INTERVENTION;  Surgeon: VSerafina Mitchell MD;  [facility];  Service: Cardiovascular;  Laterality: Right;  . Lower extremity angiogram N/A 09/21/2011    Procedure: LOWER  EXTREMITY ANGIOGRAM;  Surgeon: VSerafina Mitchell MD;  [facility];  Service: Cardiovascular;  Laterality: N/A;  . Abdominal angiogram  09/21/2011    Procedure: ABDOMINAL ANGIOGRAM;  Surgeon: VSerafina Mitchell MD;  [facility];  Service: Cardiovascular;;  . Abdominal aortagram N/A 01/17/2012    Procedure: ABDOMINAL AMaxcine Ham  Surgeon: VSerafina Mitchell MD;  [facility];  Service: Cardiovascular;  Laterality: N/A;  . Left heart catheterization with coronary angiogram N/A 04/05/2012    Procedure: LEFT HEART CATHETERIZATION WITH CORONARY ANGIOGRAM;  Surgeon: MSherren Mocha MD;  [facility];  Service: Cardiovascular;  Laterality: N/A;  . Peripheral vascular catheterization N/A 06/08/2015    Procedure: Abdominal Aortogram w/Lower Extremity;  Surgeon: CElam Dutch MD;  [facility];  Service: Cardiovascular;  Laterality: N/A;  . Hernia repair    . Femoral-tibial bypass graft Left 08/19/2015    Procedure: LEFT FEMORAL-  ABOVE KNEE POPLITEAL BYPASS GRAFT (VEIN) to [place]  Surgeon: TRosetta Posner MD;  [place]  Service: Vascular;  Laterality: Left;      reports that she quit smoking about 14 years ago. Her smoking use included Cigarettes. She has a 78 pack-year smoking history. She has never used smokeless tobacco. She reports that she drinks about 9.0 oz of alcohol per week. She reports that she does not use illicit drugs. Social History   Social History  . Marital Status: Married    Spouse Name: N/A  . Number of Children: N/A  . Years of Education: N/A   Occupational History  . Not on file.   Social History Main Topics  . Smoking status: Former Smoker -- 2.00 packs/day for 39 years    Types: Cigarettes    Quit date: 07/05/2001  . Smokeless tobacco: Never Used  . Alcohol Use: 9.0 oz/week    1 Shots of liquor, 14 Standard drinks or equivalent per week     Comment: burbon     . Drug Use: No  . Sexual Activity: Not on file   Other Topics Concern  . Not  on file   Social History Narrative   Does not get regular exercise   Functional Status Survey: Is the patient deaf or have difficulty hearing?: No Does the patient have difficulty seeing, even when wearing glasses/contacts?: No Does the patient have difficulty concentrating, remembering, or making decisions?: No Does the patient have difficulty walking or climbing stairs?: Yes Does the patient have difficulty dressing or bathing?: Yes Does the patient have difficulty doing errands alone such as visiting a doctor's office or shopping?: Yes  Allergies  Allergen Reactions  . Acetaminophen Nausea Only and Other (See Comments)    Does not tolerate well, nausea  . Ceftriaxone Itching  . Codeine Nausea And Vomiting  . Erythromycin Nausea And Vomiting  . Hydrocodone-Acetaminophen Nausea And Vomiting  . Hydromorphone Nausea And Vomiting  . Propoxyphene Hcl Nausea And Vomiting  . Statins Other (See Comments)    Leg myalgias  . Eggs Or Egg-Derived Products Other (See Comments)    "pt does not eat" Unspecified reaction  . Ibuprofen Other (See Comments)    Unspecified  Per Pt "Does not tolerate well"  . Shellfish-Derived Products Other (See Comments)    "pt does not eat" Unspecified reaction    Pertinent  Health Maintenance Due  Topic Date Due  . FOOT EXAM  02/22/1956  . OPHTHALMOLOGY EXAM  [birth year]22/1956  . DEXA SCAN  02/21/2010  . MAMMOGRAM  11/05/2015  . INFLUENZA VACCINE  02/02/2016  . HEMOGLOBIN A1C  02/16/2016  . COLONOSCOPY  11/17/2020  . PNA vac Low Risk Adult  Completed    Medications:   Medication List       This list is accurate as of: 09/08/15 11:59 PM.  Always use your most recent med list.               ALPRAZolam 0.25 MG tablet  Commonly known as:  XANAX  Take 0.25 mg by mouth 2 (two) times daily.     ALTACE 1.25 MG capsule  Generic drug:  ramipril  Take 1.25 mg by mouth  daily. BRAND NAME ONLY     aspirin EC 81 MG tablet  Take 1 tablet (81 mg total) by mouth daily.     Biotin 5 MG/ML Liqd  Take by mouth. 15 cc at bedtime swish and spit     calcium carbonate 500 MG chewable tablet  Commonly known as:  TUMS - dosed in mg elemental calcium  Chew 1,000 mg by mouth daily as needed for indigestion or heartburn.     DEXILANT 60 MG capsule  Generic drug:  dexlansoprazole  Take 60 mg by mouth 2 (two) times daily.     ergocalciferol 50000 units capsule  Commonly known as:  VITAMIN D2  Take 50,000 Units by mouth 3 (three) times a week.     furosemide 40 MG tablet  Commonly known as:  LASIX  Take 1 tablet (40 mg total) by mouth daily.     iron polysaccharides 150 MG capsule  Commonly known as:  NIFEREX  Take three (3) capsules (450 mg total) by mouth every morning and take four (4) capsules (600 mg total) by mouth every evening.     isosorbide mononitrate 30 MG 24 hr tablet  Commonly known as:  IMDUR  Take 30 mg by mouth daily.     meperidine 50 MG tablet  Commonly known as:  DEMEROL  Take 1-2 tablets (50-100 mg total) by mouth every 4 (four) hours as needed for severe pain.  metoprolol succinate 50 MG 24 hr tablet  Commonly known as:  TOPROL-XL  Take 1 tablet (50 mg total) by mouth daily. With 55m tablet (for 756mtotal).  Take with or immediately following a meal.     nitroGLYCERIN 0.4 MG SL tablet  Commonly known as:  NITROSTAT  Place 1 tablet (0.4 mg total) under the tongue every 5 (five) minutes as needed. For chest pain     omega-3 acid ethyl esters 1 g capsule  Commonly known as:  LOVAZA  Take 2 g by mouth 2 (two) times daily.     pioglitazone-metformin 15-850 MG tablet  Commonly known as:  ACTOPLUS MET  Take 1 tablet by mouth daily. Brand name only-does not work if components are given separately per patient 15-850 MG     potassium chloride 10 MEQ tablet  Commonly known as:  K-DUR  Take 1 tablet (10 mEq total) by mouth 2 (two)  times daily.     prochlorperazine 10 MG tablet  Commonly known as:  COMPAZINE  Take 1 tablet (10 mg total) by mouth every 6 (six) hours as needed. For nausea     rosuvastatin 10 MG tablet  Commonly known as:  CRESTOR  Take 10 mg by mouth every Monday.     SANTYL ointment  Generic drug:  collagenase  Apply 1 application topically daily.     sitaGLIPtin 100 MG tablet  Commonly known as:  JANUVIA  Take 100 mg by mouth at bedtime.     sodium chloride 0.65 % Soln nasal spray  Commonly known as:  OCEAN  Place 2-4 sprays into both nostrils every 4 (four) hours as needed (for nose bleeds).     sodium chloride 5 % ophthalmic solution  Commonly known as:  MURO 128  Place 1 drop into both eyes daily as needed. (lubrication)     TRESIBA FLEXTOUCH 100 UNIT/ML Sopn  Generic drug:  Insulin Degludec  Inject 16 Units into the skin daily.     ZETIA 10 MG tablet  Generic drug:  ezetimibe  TAKE 1 TABLET (10 MG TOTAL) BY MOUTH DAILY.        Review of Systems  Constitutional: Negative for fever, chills, activity change and appetite change.  HENT: Negative for congestion.   Respiratory: Negative for choking and shortness of breath.   Cardiovascular: Negative for chest pain, palpitations and leg swelling.  Gastrointestinal: Negative for nausea, vomiting, abdominal pain and constipation.  Genitourinary: Negative for dysuria.  Musculoskeletal: Positive for myalgias and arthralgias.  Neurological: Negative for dizziness and weakness.  Psychiatric/Behavioral: Negative for confusion and decreased concentration.    Filed Vitals:   09/08/15 1129  BP: 161/68  Pulse: 85  Temp: 97.7 F (36.5 C)  Resp: 19  Height: 5' 4"  (1.626 m)  Weight: 137 lb (62.143 kg)  SpO2: 95%   Body mass index is 23.5 kg/(m^2). Physical Exam  Constitutional: She is oriented to person, place, and time. She appears well-developed and well-nourished. No distress.  Cardiovascular: Normal rate, regular rhythm, normal  heart sounds and intact distal pulses.   Pulmonary/Chest: Effort normal and breath sounds normal. No respiratory distress.  Abdominal: Soft. Bowel sounds are normal. She exhibits no distension and no mass. There is no tenderness. There is no rebound and no guarding.  Musculoskeletal: Normal range of motion.  Decreased left knee extension due to posterior popliteal incision site--has posterior wound packed with gauze; R BKA wearing prosthesis   Neurological: She is alert and oriented to person, place,  and time.  Skin:  2nd and 3rd toes on the left foot are gangrenous; left leg with three incisions with glue and steristrips securing them--a couple have come loose around posterior popliteal space where pants were pulled up and down so that area is now packed with wet to dry saline, no significant erythema, warmth, drainage   Psychiatric: She has a normal mood and affect.    Labs reviewed: Basic Metabolic Panel:  Recent Labs  08/19/15 0928 08/20/15 0500 08/21/15 0524  NA 141 140 138  K 4.5 4.4 4.2  CL 104 106 103  CO2 27 27 25   GLUCOSE 88 94 202*  BUN 17 10 8   CREATININE 1.14* 0.94 0.84  CALCIUM 9.0 8.4* 8.3*   Liver Function Tests:  Recent Labs  06/05/15 2237 08/19/15 0928  AST 14* 16  ALT 9* 10*  ALKPHOS 65 86  BILITOT 0.3 0.4  PROT 6.4* 6.3*  ALBUMIN 3.3* 2.8*   No results for input(s): LIPASE, AMYLASE in the last 8760 hours. No results for input(s): AMMONIA in the last 8760 hours. CBC:  Recent Labs  08/19/15 0928 08/20/15 0500 08/21/15 0524  WBC 9.8 6.3 6.9  HGB 9.7* 8.0* 7.7*  HCT 30.9* 25.7* 24.3*  MCV 87.8 88.9 88.0  PLT 332 292 294   Cardiac Enzymes: No results for input(s): CKTOTAL, CKMB, CKMBINDEX, TROPONINI in the last 8760 hours. BNP: Invalid input(s): POCBNP CBG:  Recent Labs  10/15/15 0929 10/15/15 0946 10/15/15 1202  GLUCAP 100* 108* 146*    Assessment/Plan:   1. Left popliteal artery occlusion (HCC) -s/p bypass -surgical wound  behind knee not closed  -using wet to dry saline packing  -keep f/u with [place] and vascular as in hpi  2. Gangrene of toe ([place]) -two toes--has been told they will eventually fall off  3. Type 2 diabetes mellitus with pressure ulcer of thigh, stage 2 (HCC) - last hba1c was 6.6 -she will only check twice a day -cont baby asa, tresiba, actoplus met, altace, crestor, januvia and monitor  4. Acute blood loss anemia -continues on niferex and will need outpt cbc f/u  5. PAD (peripheral artery disease) (HCC) -cont statin, asa, zetia, bp control  6. Essential hypertension -bp elevated so toprol xl increased to 5m daily  Patient is being discharged with the following [facility] health services[facility]  Patient is being discharged with the following durable medical equipment:  No new  Patient has been advised to f/u with their PCP in 1-2 weeks to bring them up to date on their rehab stay.  Social services at facility was responsible for arranging this appointment.  Pt was provided with a 30 day supply of prescriptions for medications and refills must be obtained from their PCP.  For controlled substances, a more limited supply may be provided adequate until PCP appointment only.  Future labs/tests needed:  Should have f/u cbc

## 2015-09-11 ENCOUNTER — Encounter: Payer: Self-pay | Admitting: Vascular Surgery

## 2015-09-11 ENCOUNTER — Telehealth: Payer: Self-pay | Admitting: *Deleted

## 2015-09-11 NOTE — Telephone Encounter (Signed)
Patient called requesting appointment to be seen either Monday 09/14/15 or Tuesday 09/15/15 to evaluate the incisions especially the one behind her knee.  She is most concerned of infection.  She has returned home from [place] and upon discharge, they were unable to schedule home health.  They tried 3 home health agencies and were not successful.  Patient is scheduled to see Dr Arbie Cookey 09/15/15 @ 9:15.

## 2015-09-14 ENCOUNTER — Encounter (HOSPITAL_BASED_OUTPATIENT_CLINIC_OR_DEPARTMENT_OTHER): Payer: Medicare Other

## 2015-09-15 ENCOUNTER — Ambulatory Visit (INDEPENDENT_AMBULATORY_CARE_PROVIDER_SITE_OTHER): Payer: Medicare Other | Admitting: Vascular Surgery

## 2015-09-15 ENCOUNTER — Encounter: Payer: Self-pay | Admitting: Vascular Surgery

## 2015-09-15 ENCOUNTER — Encounter: Payer: Medicare Other | Admitting: Vascular Surgery

## 2015-09-15 VITALS — BP 121/61 | HR 100 | Ht 64.0 in | Wt 137.0 lb

## 2015-09-15 DIAGNOSIS — T8131XA Disruption of external operation (surgical) wound, not elsewhere classified, initial encounter: Secondary | ICD-10-CM

## 2015-09-15 NOTE — Progress Notes (Signed)
Her today for wound check. Her second and third toe actually quite good. She is having very nice demarcation with dry gangrene changes of her toes. She does have a separation of the saphenous vein harvest. There is fat necrosis underlying this but no evidence of infection. She has similar areas of open wounds with fat necrosis in her proximal lateral thigh.  I discussed the wound care with the patient and her husband present. Recommend continued Santel as they are doing for the enzymatic debridement. We'll see them again in 2 weeks

## 2015-09-22 ENCOUNTER — Ambulatory Visit: Payer: Medicare Other | Admitting: Vascular Surgery

## 2015-09-22 ENCOUNTER — Other Ambulatory Visit: Payer: Self-pay | Admitting: Cardiovascular Disease

## 2015-09-23 ENCOUNTER — Encounter: Payer: Self-pay | Admitting: Vascular Surgery

## 2015-09-23 NOTE — Telephone Encounter (Signed)
Okay to refill? 

## 2015-09-23 NOTE — Telephone Encounter (Signed)
No recent lipid panel in epic and per last office visit lipids followed by Dr Evlyn Kanner. Ok to refill or should this be deferred to Dr Evlyn Kanner?

## 2015-09-25 ENCOUNTER — Encounter (HOSPITAL_BASED_OUTPATIENT_CLINIC_OR_DEPARTMENT_OTHER): Payer: Medicare Other

## 2015-09-25 ENCOUNTER — Encounter (HOSPITAL_BASED_OUTPATIENT_CLINIC_OR_DEPARTMENT_OTHER): Payer: Medicare Other | Attending: Internal Medicine

## 2015-09-25 DIAGNOSIS — Y838 Other surgical procedures as the cause of abnormal reaction of the patient, or of later complication, without mention of misadventure at the time of the procedure: Secondary | ICD-10-CM | POA: Diagnosis not present

## 2015-09-25 DIAGNOSIS — L97821 Non-pressure chronic ulcer of other part of left lower leg limited to breakdown of skin: Secondary | ICD-10-CM | POA: Diagnosis not present

## 2015-09-25 DIAGNOSIS — I10 Essential (primary) hypertension: Secondary | ICD-10-CM | POA: Diagnosis not present

## 2015-09-25 DIAGNOSIS — T8131XA Disruption of external operation (surgical) wound, not elsewhere classified, initial encounter: Secondary | ICD-10-CM | POA: Insufficient documentation

## 2015-09-25 DIAGNOSIS — L97529 Non-pressure chronic ulcer of other part of left foot with unspecified severity: Secondary | ICD-10-CM | POA: Insufficient documentation

## 2015-09-25 DIAGNOSIS — E1152 Type 2 diabetes mellitus with diabetic peripheral angiopathy with gangrene: Secondary | ICD-10-CM | POA: Insufficient documentation

## 2015-09-25 DIAGNOSIS — I7301 Raynaud's syndrome with gangrene: Secondary | ICD-10-CM | POA: Diagnosis not present

## 2015-09-25 DIAGNOSIS — E11622 Type 2 diabetes mellitus with other skin ulcer: Secondary | ICD-10-CM | POA: Insufficient documentation

## 2015-09-25 DIAGNOSIS — Z87891 Personal history of nicotine dependence: Secondary | ICD-10-CM | POA: Insufficient documentation

## 2015-09-25 DIAGNOSIS — L97121 Non-pressure chronic ulcer of left thigh limited to breakdown of skin: Secondary | ICD-10-CM | POA: Diagnosis not present

## 2015-09-25 DIAGNOSIS — Z794 Long term (current) use of insulin: Secondary | ICD-10-CM | POA: Insufficient documentation

## 2015-09-25 DIAGNOSIS — Z89511 Acquired absence of right leg below knee: Secondary | ICD-10-CM | POA: Diagnosis not present

## 2015-09-29 ENCOUNTER — Ambulatory Visit (INDEPENDENT_AMBULATORY_CARE_PROVIDER_SITE_OTHER): Payer: Self-pay | Admitting: Vascular Surgery

## 2015-09-29 ENCOUNTER — Encounter: Payer: Self-pay | Admitting: Vascular Surgery

## 2015-09-29 VITALS — BP 95/59 | HR 83 | Temp 98.1°F | Resp 20 | Ht 64.0 in | Wt 140.0 lb

## 2015-09-29 DIAGNOSIS — T8131XA Disruption of external operation (surgical) wound, not elsewhere classified, initial encounter: Secondary | ICD-10-CM

## 2015-09-29 NOTE — Progress Notes (Signed)
Patient name: [name] MRN: [id number] DOB: 19-Apr-1946 Sex: female  REASON FOR VISIT: Follow-up of above-knee to below-knee popliteal bypass left leg  HPI: [place] is a 70 y.o. female here today for kidney follow-up of wound issues regarding her left vein harvest of her small saphenous vein. She has excellent flow to her foot. 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 Her foot is warm and well perfused and the pain is resolved. She did have wound separation at her small saphenous vein harvest at the popliteal space. This looks quite good today. There is nice granulation tissue. She reports this was debrided the wound center. I am more concerned regarding her thigh wound. This is been very unusual. It was present prior to her surgery. She feels that she struck this on her wheelchair. She has had extensive progressive necrosis under the skin and is being treated at the wound center as well for this.  Current Outpatient Prescriptions  Medication Sig Dispense Refill  . ALPRAZolam (XANAX) 0.25 MG tablet Take 0.25 mg by mouth 2 (two) times daily.    Marland Kitchen aspirin EC 81 MG tablet Take 1 tablet (81 mg total) by mouth daily.    . Biotin 5 MG/ML LIQD Take by mouth. 15 cc at bedtime swish and spit    . calcium carbonate (TUMS - DOSED IN MG ELEMENTAL CALCIUM) 500 MG chewable tablet Chew 1,000 mg by mouth daily as needed for indigestion or heartburn.    . collagenase (SANTYL) ointment Apply 1 application topically daily.    Marland Kitchen dexlansoprazole (DEXILANT) 60 MG capsule Take 60 mg by mouth 2 (two) times daily.    Marland Kitchen doxycycline (VIBRA-TABS) 100 MG tablet Take 1 tablet by mouth 2 (two) times daily.  0  . ergocalciferol (VITAMIN D2) 50000 units capsule Take 50,000 Units by mouth 3 (three) times a week.    . ezetimibe (ZETIA) 10 MG tablet TAKE 1 TABLET BY MOUTH EVERY DAY 90 tablet 2  . furosemide (LASIX) 40 MG  tablet Take 1 tablet (40 mg total) by mouth daily. 30 tablet 1  . Insulin Degludec (TRESIBA FLEXTOUCH) 100 UNIT/ML SOPN Inject 16 Units into the skin daily.    . iron polysaccharides (NIFEREX) 150 MG capsule Take three (3) capsules (450 mg total) by mouth every morning and take four (4) capsules (600 mg total) by mouth every evening.    . isosorbide mononitrate (IMDUR) 30 MG 24 hr tablet Take 30 mg by mouth daily.    . meperidine (DEMEROL) 50 MG tablet Take 1-2 tablets (50-100 mg total) by mouth every 4 (four) hours as needed for severe pain. (Patient taking differently: Take 50 mg by mouth. Take one tablets every 4 hours as needed for pain (1-5);  Take two tablets every 4 hours as needed for pain (6-10)) 60 tablet 0  . metoprolol succinate (TOPROL-XL) 50 MG 24 hr tablet Take 1 tablet (50 mg total) by mouth daily. With 73m tablet (for 751mtotal).  Take with or immediately following a meal. 30 tablet 0  . nitroGLYCERIN (NITROSTAT) 0.4 MG SL tablet Place 1 tablet (0.4 mg total) under the tongue every 5 (five) minutes as needed. For chest pain 25 tablet 2  . omega-3 acid ethyl esters (LOVAZA) 1 G capsule Take 2 g by mouth 2 (two) times daily.     . pioglitazone-metformin (ACTOPLUS MET) 15-850 MG per tablet Take 1 tablet by  mouth daily. Brand name only-does not work if components are given separately per patient 15-850 MG    . potassium chloride (K-DUR) 10 MEQ tablet Take 1 tablet (10 mEq total) by mouth 2 (two) times daily. 60 tablet 1  . prochlorperazine (COMPAZINE) 10 MG tablet Take 1 tablet (10 mg total) by mouth every 6 (six) hours as needed. For nausea 30 tablet 2  . ramipril (ALTACE) 1.25 MG capsule Take 1.25 mg by mouth daily. BRAND NAME ONLY    . rosuvastatin (CRESTOR) 10 MG tablet Take 10 mg by mouth every Monday.     . sitaGLIPtan (JANUVIA) 100 MG tablet Take 100 mg by mouth at bedtime.     . sodium chloride (MURO 128) 5 % ophthalmic solution Place 1 drop into both eyes daily as needed.  (lubrication)    . sodium chloride (OCEAN) 0.65 % SOLN nasal spray Place 2-4 sprays into both nostrils every 4 (four) hours as needed (for nose bleeds).    Marland Kitchen sulfamethoxazole-trimethoprim (BACTRIM DS,SEPTRA DS) 800-160 MG tablet TK 1 T PO BID  1   No current facility-administered medications for this visit.       PHYSICAL EXAM: Filed Vitals:   09/29/15 0907  BP: 95/59  Pulse: 83  Temp: 98.1 F (36.7 C)  TempSrc: Oral  Resp: 20  Height: _0  (1.626 m)  Weight: 140 lb (63.504 kg)  SpO2: 98%    GENERAL: The patient is a well-nourished female, in no acute distress. The vital signs are documented above. Well-perfused foot with dry gangrenous changes of her left second and third toe. Popliteal wound healing with the area directly behind the popliteal and both the area below in the posterior aspect of her calf with a good granulating base. Continues to have progressive fat necrosis around the areas on her proximal thigh  MEDICAL ISSUES: Stable from a peripheral vascular standpoint. Continue to follow-up with wound center regarding these thigh wounds of unknown etiology. I will see her again in one month for continued  [place] Voyt[facility] [employer]: [phone]

## 2015-10-02 DIAGNOSIS — T8131XA Disruption of external operation (surgical) wound, not elsewhere classified, initial encounter: Secondary | ICD-10-CM | POA: Diagnosis not present

## 2015-10-09 ENCOUNTER — Encounter (HOSPITAL_BASED_OUTPATIENT_CLINIC_OR_DEPARTMENT_OTHER): Payer: Medicare Other | Attending: Internal Medicine

## 2015-10-09 ENCOUNTER — Other Ambulatory Visit: Payer: Self-pay | Admitting: Internal Medicine

## 2015-10-09 DIAGNOSIS — Y838 Other surgical procedures as the cause of abnormal reaction of the patient, or of later complication, without mention of misadventure at the time of the procedure: Secondary | ICD-10-CM | POA: Insufficient documentation

## 2015-10-09 DIAGNOSIS — L97821 Non-pressure chronic ulcer of other part of left lower leg limited to breakdown of skin: Secondary | ICD-10-CM | POA: Diagnosis not present

## 2015-10-09 DIAGNOSIS — D649 Anemia, unspecified: Secondary | ICD-10-CM | POA: Diagnosis not present

## 2015-10-09 DIAGNOSIS — L97521 Non-pressure chronic ulcer of other part of left foot limited to breakdown of skin: Secondary | ICD-10-CM | POA: Insufficient documentation

## 2015-10-09 DIAGNOSIS — I10 Essential (primary) hypertension: Secondary | ICD-10-CM | POA: Insufficient documentation

## 2015-10-09 DIAGNOSIS — E11622 Type 2 diabetes mellitus with other skin ulcer: Secondary | ICD-10-CM | POA: Insufficient documentation

## 2015-10-09 DIAGNOSIS — I7300 Raynaud's syndrome without gangrene: Secondary | ICD-10-CM | POA: Insufficient documentation

## 2015-10-09 DIAGNOSIS — T8131XA Disruption of external operation (surgical) wound, not elsewhere classified, initial encounter: Secondary | ICD-10-CM | POA: Insufficient documentation

## 2015-10-09 DIAGNOSIS — L97121 Non-pressure chronic ulcer of left thigh limited to breakdown of skin: Secondary | ICD-10-CM | POA: Diagnosis not present

## 2015-10-09 DIAGNOSIS — L97822 Non-pressure chronic ulcer of other part of left lower leg with fat layer exposed: Secondary | ICD-10-CM | POA: Diagnosis not present

## 2015-10-09 DIAGNOSIS — E1152 Type 2 diabetes mellitus with diabetic peripheral angiopathy with gangrene: Secondary | ICD-10-CM | POA: Insufficient documentation

## 2015-10-12 DIAGNOSIS — E11622 Type 2 diabetes mellitus with other skin ulcer: Secondary | ICD-10-CM | POA: Diagnosis not present

## 2015-10-12 LAB — GLUCOSE, CAPILLARY
GLUCOSE-CAPILLARY: 75 mg/dL (ref 65–99)
GLUCOSE-CAPILLARY: 75 mg/dL (ref 65–99)
GLUCOSE-CAPILLARY: 99 mg/dL (ref 65–99)

## 2015-10-13 DIAGNOSIS — E11622 Type 2 diabetes mellitus with other skin ulcer: Secondary | ICD-10-CM | POA: Diagnosis not present

## 2015-10-13 LAB — GLUCOSE, CAPILLARY
GLUCOSE-CAPILLARY: 96 mg/dL (ref 65–99)
Glucose-Capillary: 113 mg/dL — ABNORMAL HIGH (ref 65–99)
Glucose-Capillary: 92 mg/dL (ref 65–99)
Glucose-Capillary: 165 mg/dL — ABNORMAL HIGH (ref 65–99)

## 2015-10-14 DIAGNOSIS — E11622 Type 2 diabetes mellitus with other skin ulcer: Secondary | ICD-10-CM | POA: Diagnosis not present

## 2015-10-14 LAB — GLUCOSE, CAPILLARY
GLUCOSE-CAPILLARY: 184 mg/dL — AB (ref 65–99)
Glucose-Capillary: 103 mg/dL — ABNORMAL HIGH (ref 65–99)
Glucose-Capillary: 105 mg/dL — ABNORMAL HIGH (ref 65–99)
Glucose-Capillary: 115 mg/dL — ABNORMAL HIGH (ref 65–99)

## 2015-10-15 DIAGNOSIS — E11622 Type 2 diabetes mellitus with other skin ulcer: Secondary | ICD-10-CM | POA: Diagnosis not present

## 2015-10-15 LAB — GLUCOSE, CAPILLARY
GLUCOSE-CAPILLARY: 100 mg/dL — AB (ref 65–99)
GLUCOSE-CAPILLARY: 146 mg/dL — AB (ref 65–99)
Glucose-Capillary: 108 mg/dL — ABNORMAL HIGH (ref 65–99)

## 2015-10-16 DIAGNOSIS — E11622 Type 2 diabetes mellitus with other skin ulcer: Secondary | ICD-10-CM | POA: Diagnosis not present

## 2015-10-16 LAB — GLUCOSE, CAPILLARY
GLUCOSE-CAPILLARY: 145 mg/dL — AB (ref 65–99)
Glucose-Capillary: 135 mg/dL — ABNORMAL HIGH (ref 65–99)

## 2015-10-19 DIAGNOSIS — E11622 Type 2 diabetes mellitus with other skin ulcer: Secondary | ICD-10-CM | POA: Diagnosis not present

## 2015-10-19 LAB — GLUCOSE, CAPILLARY
Glucose-Capillary: 124 mg/dL — ABNORMAL HIGH (ref 65–99)
Glucose-Capillary: 192 mg/dL — ABNORMAL HIGH (ref 65–99)
Glucose-Capillary: 85 mg/dL (ref 65–99)

## 2015-10-20 DIAGNOSIS — E11622 Type 2 diabetes mellitus with other skin ulcer: Secondary | ICD-10-CM | POA: Diagnosis not present

## 2015-10-20 LAB — GLUCOSE, CAPILLARY
Glucose-Capillary: 113 mg/dL — ABNORMAL HIGH (ref 65–99)
Glucose-Capillary: 166 mg/dL — ABNORMAL HIGH (ref 65–99)

## 2015-10-21 DIAGNOSIS — E11622 Type 2 diabetes mellitus with other skin ulcer: Secondary | ICD-10-CM | POA: Diagnosis not present

## 2015-10-21 LAB — GLUCOSE, CAPILLARY
Glucose-Capillary: 264 mg/dL — ABNORMAL HIGH (ref 65–99)
Glucose-Capillary: 241 mg/dL — ABNORMAL HIGH (ref 65–99)

## 2015-10-22 DIAGNOSIS — E11622 Type 2 diabetes mellitus with other skin ulcer: Secondary | ICD-10-CM | POA: Diagnosis not present

## 2015-10-23 DIAGNOSIS — E11622 Type 2 diabetes mellitus with other skin ulcer: Secondary | ICD-10-CM | POA: Diagnosis not present

## 2015-10-23 LAB — GLUCOSE, CAPILLARY
GLUCOSE-CAPILLARY: 193 mg/dL — AB (ref 65–99)
GLUCOSE-CAPILLARY: 243 mg/dL — AB (ref 65–99)
Glucose-Capillary: 193 mg/dL — ABNORMAL HIGH (ref 65–99)
Glucose-Capillary: 240 mg/dL — ABNORMAL HIGH (ref 65–99)

## 2015-10-26 DIAGNOSIS — E11622 Type 2 diabetes mellitus with other skin ulcer: Secondary | ICD-10-CM | POA: Diagnosis not present

## 2015-10-26 LAB — GLUCOSE, CAPILLARY
Glucose-Capillary: 147 mg/dL — ABNORMAL HIGH (ref 65–99)
Glucose-Capillary: 196 mg/dL — ABNORMAL HIGH (ref 65–99)

## 2015-10-27 DIAGNOSIS — E11622 Type 2 diabetes mellitus with other skin ulcer: Secondary | ICD-10-CM | POA: Diagnosis not present

## 2015-10-27 LAB — GLUCOSE, CAPILLARY
GLUCOSE-CAPILLARY: 232 mg/dL — AB (ref 65–99)
GLUCOSE-CAPILLARY: 153 mg/dL — AB (ref 65–99)

## 2015-10-28 DIAGNOSIS — E11622 Type 2 diabetes mellitus with other skin ulcer: Secondary | ICD-10-CM | POA: Diagnosis not present

## 2015-10-29 ENCOUNTER — Encounter: Payer: Self-pay | Admitting: Vascular Surgery

## 2015-10-29 DIAGNOSIS — E11622 Type 2 diabetes mellitus with other skin ulcer: Secondary | ICD-10-CM | POA: Diagnosis not present

## 2015-10-29 LAB — GLUCOSE, CAPILLARY
GLUCOSE-CAPILLARY: 179 mg/dL — AB (ref 65–99)
Glucose-Capillary: 199 mg/dL — ABNORMAL HIGH (ref 65–99)

## 2015-10-30 DIAGNOSIS — E11622 Type 2 diabetes mellitus with other skin ulcer: Secondary | ICD-10-CM | POA: Diagnosis not present

## 2015-10-30 LAB — GLUCOSE, CAPILLARY
GLUCOSE-CAPILLARY: 181 mg/dL — AB (ref 65–99)
GLUCOSE-CAPILLARY: 167 mg/dL — AB (ref 65–99)
Glucose-Capillary: 159 mg/dL — ABNORMAL HIGH (ref 65–99)
Glucose-Capillary: 139 mg/dL — ABNORMAL HIGH (ref 65–99)

## 2015-11-02 ENCOUNTER — Encounter (HOSPITAL_BASED_OUTPATIENT_CLINIC_OR_DEPARTMENT_OTHER): Payer: Medicare Other | Attending: Internal Medicine

## 2015-11-02 DIAGNOSIS — L97221 Non-pressure chronic ulcer of left calf limited to breakdown of skin: Secondary | ICD-10-CM | POA: Diagnosis not present

## 2015-11-02 DIAGNOSIS — L97822 Non-pressure chronic ulcer of other part of left lower leg with fat layer exposed: Secondary | ICD-10-CM | POA: Diagnosis not present

## 2015-11-02 DIAGNOSIS — L97521 Non-pressure chronic ulcer of other part of left foot limited to breakdown of skin: Secondary | ICD-10-CM | POA: Diagnosis not present

## 2015-11-02 DIAGNOSIS — E11621 Type 2 diabetes mellitus with foot ulcer: Secondary | ICD-10-CM | POA: Diagnosis not present

## 2015-11-02 DIAGNOSIS — I10 Essential (primary) hypertension: Secondary | ICD-10-CM | POA: Diagnosis not present

## 2015-11-02 DIAGNOSIS — E11622 Type 2 diabetes mellitus with other skin ulcer: Secondary | ICD-10-CM | POA: Insufficient documentation

## 2015-11-02 LAB — GLUCOSE, CAPILLARY
GLUCOSE-CAPILLARY: 223 mg/dL — AB (ref 65–99)
GLUCOSE-CAPILLARY: 208 mg/dL — AB (ref 65–99)

## 2015-11-03 ENCOUNTER — Encounter: Payer: Self-pay | Admitting: Vascular Surgery

## 2015-11-03 ENCOUNTER — Ambulatory Visit (INDEPENDENT_AMBULATORY_CARE_PROVIDER_SITE_OTHER): Payer: Medicare Other | Admitting: Vascular Surgery

## 2015-11-03 VITALS — BP 110/60 | HR 80 | Temp 97.0°F | Resp 16 | Ht 64.0 in | Wt 140.0 lb

## 2015-11-03 DIAGNOSIS — I779 Disorder of arteries and arterioles, unspecified: Secondary | ICD-10-CM

## 2015-11-03 DIAGNOSIS — E11622 Type 2 diabetes mellitus with other skin ulcer: Secondary | ICD-10-CM | POA: Diagnosis not present

## 2015-11-03 LAB — GLUCOSE, CAPILLARY
GLUCOSE-CAPILLARY: 155 mg/dL — AB (ref 65–99)
Glucose-Capillary: 177 mg/dL — ABNORMAL HIGH (ref 65–99)

## 2015-11-03 NOTE — Progress Notes (Signed)
Patient name: [name] MRN: [id number] DOB: July 31, 1945 Sex: female  REASON FOR VISIT: Here today for continued follow-up of her extension bypass from her distal anastomosis above-knee to below-knee popliteal bypass with reversed small saphenous vein on 08/19/2015.  HPI: [name] is a 70 y.o. female she looks quite good today. Her vein harvest incision is nearly healed. She does have areas of fat necrosis on her thigh and is currently undergoing hyperbaric treatment at the wound center. I did not examine these wounds but she and her husband reports these have continued to improve with some angulation tissue appearing.  Current Outpatient Prescriptions  Medication Sig Dispense Refill  . ALPRAZolam (XANAX) 0.25 MG tablet Take 0.25 mg by mouth 2 (two) times daily.    Marland Kitchen aspirin EC 81 MG tablet Take 1 tablet (81 mg total) by mouth daily.    . Biotin 5 MG/ML LIQD Take by mouth. 15 cc at bedtime swish and spit    . calcium carbonate (TUMS - DOSED IN MG ELEMENTAL CALCIUM) 500 MG chewable tablet Chew 1,000 mg by mouth daily as needed for indigestion or heartburn.    . ciprofloxacin (CIPRO) 500 MG tablet TK 1 T PO BID FOR 10 DAYS  0  . collagenase (SANTYL) ointment Apply 1 application topically daily.    Marland Kitchen dexlansoprazole (DEXILANT) 60 MG capsule Take 60 mg by mouth 2 (two) times daily.    . ergocalciferol (VITAMIN D2) 50000 units capsule Take 50,000 Units by mouth 3 (three) times a week.    . ezetimibe (ZETIA) 10 MG tablet TAKE 1 TABLET BY MOUTH EVERY DAY 90 tablet 2  . furosemide (LASIX) 40 MG tablet Take 1 tablet (40 mg total) by mouth daily. 30 tablet 1  . Insulin Degludec (TRESIBA FLEXTOUCH) 100 UNIT/ML SOPN Inject 16 Units into the skin daily.    . iron polysaccharides (NIFEREX) 150 MG capsule Take three (3) capsules (450 mg total) by mouth every morning and take four (4) capsules (600 mg total) by mouth every evening.    . isosorbide mononitrate (IMDUR) 30 MG 24 hr tablet Take 30 mg  by mouth daily.    . meperidine (DEMEROL) 50 MG tablet Take 1-2 tablets (50-100 mg total) by mouth every 4 (four) hours as needed for severe pain. (Patient taking differently: Take 50 mg by mouth. Take one tablets every 4 hours as needed for pain (1-5);  Take two tablets every 4 hours as needed for pain (6-10)) 60 tablet 0  . metoprolol succinate (TOPROL-XL) 50 MG 24 hr tablet Take 1 tablet (50 mg total) by mouth daily. With 4m tablet (for 724mtotal).  Take with or immediately following a meal. 30 tablet 0  . nitroGLYCERIN (NITROSTAT) 0.4 MG SL tablet Place 1 tablet (0.4 mg total) under the tongue every 5 (five) minutes as needed. For chest pain 25 tablet 2  . omega-3 acid ethyl esters (LOVAZA) 1 G capsule Take 2 g by mouth 2 (two) times daily.     . pioglitazone-metformin (ACTOPLUS MET) 15-850 MG per tablet Take 1 tablet by mouth daily. Brand name only-does not work if components are given separately per patient 15-850 MG    . potassium chloride (K-DUR) 10 MEQ tablet Take 1 tablet (10 mEq total) by mouth 2 (two) times daily. 60 tablet 1  . prochlorperazine (COMPAZINE) 10 MG tablet Take 1 tablet (10 mg total) by mouth every 6 (six) hours as needed. For nausea 30 tablet 2  . ramipril (ALTACE) 1.25 MG capsule  Take 1.25 mg by mouth daily. BRAND NAME ONLY    . rosuvastatin (CRESTOR) 10 MG tablet Take 10 mg by mouth every Monday.     . sitaGLIPtan (JANUVIA) 100 MG tablet Take 100 mg by mouth at bedtime.     . sodium chloride (MURO 128) 5 % ophthalmic solution Place 1 drop into both eyes daily as needed. (lubrication)    . sodium chloride (OCEAN) 0.65 % SOLN nasal spray Place 2-4 sprays into both nostrils every 4 (four) hours as needed (for nose bleeds).     No current facility-administered medications for this visit.        PHYSICAL EXAM: Filed Vitals:   11/03/15 1355  BP: 110/60  Pulse: 80  Temp: 97 F (36.1 C)  TempSrc: Oral  Resp: 16  Height: 5' 4" (1.626 m)  Weight: 140 lb (63.504  kg)    GENERAL: The patient is a well-nourished female, in no acute distress. The vital signs are documented above. Her small saphenous harvest incision is nearly healed. There is a very superficial area of the wound with no evidence of infection Her foot is well perfused with dry gangrenous changes on her tips of her second and third toe  MEDICAL ISSUES: Stable overall. We'll continue to keep her left foot clean and dry. Will see [place] again in 2 months. Explained that this will eventually auto amputate with the areas of dry gangrene on the tips of her toes. Explained may require small outpatient procedure to debride close these as it demarcates.  [facility] [place]: [phone]

## 2015-11-04 DIAGNOSIS — E11622 Type 2 diabetes mellitus with other skin ulcer: Secondary | ICD-10-CM | POA: Diagnosis not present

## 2015-11-04 LAB — GLUCOSE, CAPILLARY
GLUCOSE-CAPILLARY: 208 mg/dL — AB (ref 65–99)
Glucose-Capillary: 156 mg/dL — ABNORMAL HIGH (ref 65–99)

## 2015-11-06 DIAGNOSIS — E11622 Type 2 diabetes mellitus with other skin ulcer: Secondary | ICD-10-CM | POA: Diagnosis not present

## 2015-11-06 LAB — GLUCOSE, CAPILLARY
GLUCOSE-CAPILLARY: 199 mg/dL — AB (ref 65–99)
GLUCOSE-CAPILLARY: 161 mg/dL — AB (ref 65–99)

## 2015-11-09 DIAGNOSIS — E11622 Type 2 diabetes mellitus with other skin ulcer: Secondary | ICD-10-CM | POA: Diagnosis not present

## 2015-11-10 DIAGNOSIS — E11622 Type 2 diabetes mellitus with other skin ulcer: Secondary | ICD-10-CM | POA: Diagnosis not present

## 2015-11-10 LAB — GLUCOSE, CAPILLARY
GLUCOSE-CAPILLARY: 166 mg/dL — AB (ref 65–99)
GLUCOSE-CAPILLARY: 120 mg/dL — AB (ref 65–99)
Glucose-Capillary: 176 mg/dL — ABNORMAL HIGH (ref 65–99)
Glucose-Capillary: 187 mg/dL — ABNORMAL HIGH (ref 65–99)

## 2015-11-11 DIAGNOSIS — E11622 Type 2 diabetes mellitus with other skin ulcer: Secondary | ICD-10-CM | POA: Diagnosis not present

## 2015-11-12 DIAGNOSIS — E11622 Type 2 diabetes mellitus with other skin ulcer: Secondary | ICD-10-CM | POA: Diagnosis not present

## 2015-11-12 LAB — GLUCOSE, CAPILLARY
GLUCOSE-CAPILLARY: 225 mg/dL — AB (ref 65–99)
GLUCOSE-CAPILLARY: 242 mg/dL — AB (ref 65–99)

## 2015-11-13 DIAGNOSIS — E11622 Type 2 diabetes mellitus with other skin ulcer: Secondary | ICD-10-CM | POA: Diagnosis not present

## 2015-11-13 LAB — GLUCOSE, CAPILLARY
GLUCOSE-CAPILLARY: 242 mg/dL — AB (ref 65–99)
GLUCOSE-CAPILLARY: 240 mg/dL — AB (ref 65–99)
Glucose-Capillary: 203 mg/dL — ABNORMAL HIGH (ref 65–99)

## 2015-11-16 DIAGNOSIS — E11622 Type 2 diabetes mellitus with other skin ulcer: Secondary | ICD-10-CM | POA: Diagnosis not present

## 2015-11-16 LAB — GLUCOSE, CAPILLARY
GLUCOSE-CAPILLARY: 199 mg/dL — AB (ref 65–99)
Glucose-Capillary: 206 mg/dL — ABNORMAL HIGH (ref 65–99)

## 2015-11-17 DIAGNOSIS — E11622 Type 2 diabetes mellitus with other skin ulcer: Secondary | ICD-10-CM | POA: Diagnosis not present

## 2015-11-18 DIAGNOSIS — E11622 Type 2 diabetes mellitus with other skin ulcer: Secondary | ICD-10-CM | POA: Diagnosis not present

## 2015-11-18 LAB — GLUCOSE, CAPILLARY
GLUCOSE-CAPILLARY: 195 mg/dL — AB (ref 65–99)
GLUCOSE-CAPILLARY: 146 mg/dL — AB (ref 65–99)

## 2015-11-19 DIAGNOSIS — E11622 Type 2 diabetes mellitus with other skin ulcer: Secondary | ICD-10-CM | POA: Diagnosis not present

## 2015-11-19 LAB — GLUCOSE, CAPILLARY
Glucose-Capillary: 182 mg/dL — ABNORMAL HIGH (ref 65–99)
Glucose-Capillary: 167 mg/dL — ABNORMAL HIGH (ref 65–99)

## 2015-11-20 DIAGNOSIS — E11622 Type 2 diabetes mellitus with other skin ulcer: Secondary | ICD-10-CM | POA: Diagnosis not present

## 2015-11-20 LAB — GLUCOSE, CAPILLARY
GLUCOSE-CAPILLARY: 205 mg/dL — AB (ref 65–99)
GLUCOSE-CAPILLARY: 158 mg/dL — AB (ref 65–99)
Glucose-Capillary: 158 mg/dL — ABNORMAL HIGH (ref 65–99)
Glucose-Capillary: 146 mg/dL — ABNORMAL HIGH (ref 65–99)

## 2015-11-24 DIAGNOSIS — E11622 Type 2 diabetes mellitus with other skin ulcer: Secondary | ICD-10-CM | POA: Diagnosis not present

## 2015-11-24 LAB — GLUCOSE, CAPILLARY
GLUCOSE-CAPILLARY: 144 mg/dL — AB (ref 65–99)
Glucose-Capillary: 141 mg/dL — ABNORMAL HIGH (ref 65–99)

## 2015-11-25 DIAGNOSIS — E11622 Type 2 diabetes mellitus with other skin ulcer: Secondary | ICD-10-CM | POA: Diagnosis not present

## 2015-11-25 LAB — GLUCOSE, CAPILLARY
GLUCOSE-CAPILLARY: 148 mg/dL — AB (ref 65–99)
GLUCOSE-CAPILLARY: 120 mg/dL — AB (ref 65–99)

## 2015-11-26 DIAGNOSIS — E11622 Type 2 diabetes mellitus with other skin ulcer: Secondary | ICD-10-CM | POA: Diagnosis not present

## 2015-11-27 DIAGNOSIS — E11622 Type 2 diabetes mellitus with other skin ulcer: Secondary | ICD-10-CM | POA: Diagnosis not present

## 2015-11-27 LAB — GLUCOSE, CAPILLARY
Glucose-Capillary: 137 mg/dL — ABNORMAL HIGH (ref 65–99)
Glucose-Capillary: 169 mg/dL — ABNORMAL HIGH (ref 65–99)

## 2015-12-01 DIAGNOSIS — E11622 Type 2 diabetes mellitus with other skin ulcer: Secondary | ICD-10-CM | POA: Diagnosis not present

## 2015-12-01 LAB — GLUCOSE, CAPILLARY
Glucose-Capillary: 180 mg/dL — ABNORMAL HIGH (ref 65–99)
Glucose-Capillary: 152 mg/dL — ABNORMAL HIGH (ref 65–99)

## 2015-12-02 DIAGNOSIS — E11622 Type 2 diabetes mellitus with other skin ulcer: Secondary | ICD-10-CM | POA: Diagnosis not present

## 2015-12-02 LAB — GLUCOSE, CAPILLARY
Glucose-Capillary: 156 mg/dL — ABNORMAL HIGH (ref 65–99)
Glucose-Capillary: 124 mg/dL — ABNORMAL HIGH (ref 65–99)

## 2015-12-03 ENCOUNTER — Encounter (HOSPITAL_BASED_OUTPATIENT_CLINIC_OR_DEPARTMENT_OTHER): Payer: Medicare Other | Attending: Internal Medicine

## 2015-12-03 DIAGNOSIS — E11622 Type 2 diabetes mellitus with other skin ulcer: Secondary | ICD-10-CM | POA: Diagnosis not present

## 2015-12-03 DIAGNOSIS — I10 Essential (primary) hypertension: Secondary | ICD-10-CM | POA: Diagnosis not present

## 2015-12-03 DIAGNOSIS — L97822 Non-pressure chronic ulcer of other part of left lower leg with fat layer exposed: Secondary | ICD-10-CM | POA: Insufficient documentation

## 2015-12-03 DIAGNOSIS — L97521 Non-pressure chronic ulcer of other part of left foot limited to breakdown of skin: Secondary | ICD-10-CM | POA: Insufficient documentation

## 2015-12-03 DIAGNOSIS — Z87891 Personal history of nicotine dependence: Secondary | ICD-10-CM | POA: Insufficient documentation

## 2015-12-03 DIAGNOSIS — Z89511 Acquired absence of right leg below knee: Secondary | ICD-10-CM | POA: Diagnosis not present

## 2015-12-03 DIAGNOSIS — E11621 Type 2 diabetes mellitus with foot ulcer: Secondary | ICD-10-CM | POA: Insufficient documentation

## 2015-12-03 DIAGNOSIS — I739 Peripheral vascular disease, unspecified: Secondary | ICD-10-CM | POA: Diagnosis not present

## 2015-12-03 DIAGNOSIS — L03116 Cellulitis of left lower limb: Secondary | ICD-10-CM | POA: Diagnosis not present

## 2015-12-03 LAB — GLUCOSE, CAPILLARY
GLUCOSE-CAPILLARY: 158 mg/dL — AB (ref 65–99)
GLUCOSE-CAPILLARY: 134 mg/dL — AB (ref 65–99)

## 2015-12-04 DIAGNOSIS — E11622 Type 2 diabetes mellitus with other skin ulcer: Secondary | ICD-10-CM | POA: Insufficient documentation

## 2015-12-04 DIAGNOSIS — E11621 Type 2 diabetes mellitus with foot ulcer: Secondary | ICD-10-CM | POA: Diagnosis not present

## 2015-12-04 DIAGNOSIS — L97521 Non-pressure chronic ulcer of other part of left foot limited to breakdown of skin: Secondary | ICD-10-CM | POA: Insufficient documentation

## 2015-12-04 DIAGNOSIS — L97122 Non-pressure chronic ulcer of left thigh with fat layer exposed: Secondary | ICD-10-CM | POA: Insufficient documentation

## 2015-12-04 DIAGNOSIS — I7301 Raynaud's syndrome with gangrene: Secondary | ICD-10-CM | POA: Insufficient documentation

## 2015-12-04 DIAGNOSIS — E1152 Type 2 diabetes mellitus with diabetic peripheral angiopathy with gangrene: Secondary | ICD-10-CM | POA: Insufficient documentation

## 2015-12-04 DIAGNOSIS — T8131XA Disruption of external operation (surgical) wound, not elsewhere classified, initial encounter: Secondary | ICD-10-CM | POA: Insufficient documentation

## 2015-12-04 DIAGNOSIS — L97121 Non-pressure chronic ulcer of left thigh limited to breakdown of skin: Secondary | ICD-10-CM | POA: Insufficient documentation

## 2015-12-04 DIAGNOSIS — Y838 Other surgical procedures as the cause of abnormal reaction of the patient, or of later complication, without mention of misadventure at the time of the procedure: Secondary | ICD-10-CM | POA: Insufficient documentation

## 2015-12-04 DIAGNOSIS — I10 Essential (primary) hypertension: Secondary | ICD-10-CM | POA: Insufficient documentation

## 2015-12-07 ENCOUNTER — Encounter (HOSPITAL_BASED_OUTPATIENT_CLINIC_OR_DEPARTMENT_OTHER): Payer: Medicare Other

## 2015-12-07 LAB — GLUCOSE, CAPILLARY
GLUCOSE-CAPILLARY: 204 mg/dL — AB (ref 65–99)
GLUCOSE-CAPILLARY: 219 mg/dL — AB (ref 65–99)
GLUCOSE-CAPILLARY: 147 mg/dL — AB (ref 65–99)
Glucose-Capillary: 166 mg/dL — ABNORMAL HIGH (ref 65–99)

## 2015-12-09 ENCOUNTER — Encounter (HOSPITAL_COMMUNITY): Payer: Self-pay | Admitting: Emergency Medicine

## 2015-12-09 ENCOUNTER — Inpatient Hospital Stay (HOSPITAL_COMMUNITY)
Admission: EM | Admit: 2015-12-09 | Discharge: 2015-12-14 | DRG: 871 | Disposition: A | Discharge status: Home / Self Care | Payer: Medicare Other | Attending: Internal Medicine | Admitting: Internal Medicine

## 2015-12-09 ENCOUNTER — Inpatient Hospital Stay (HOSPITAL_COMMUNITY): Payer: Medicare Other

## 2015-12-09 ENCOUNTER — Emergency Department (HOSPITAL_COMMUNITY): Payer: Medicare Other

## 2015-12-09 DIAGNOSIS — K219 Gastro-esophageal reflux disease without esophagitis: Secondary | ICD-10-CM | POA: Diagnosis present

## 2015-12-09 DIAGNOSIS — I5031 Acute diastolic (congestive) heart failure: Secondary | ICD-10-CM | POA: Diagnosis not present

## 2015-12-09 DIAGNOSIS — J810 Acute pulmonary edema: Secondary | ICD-10-CM | POA: Diagnosis not present

## 2015-12-09 DIAGNOSIS — J9600 Acute respiratory failure, unspecified whether with hypoxia or hypercapnia: Secondary | ICD-10-CM | POA: Diagnosis not present

## 2015-12-09 DIAGNOSIS — I2510 Atherosclerotic heart disease of native coronary artery without angina pectoris: Secondary | ICD-10-CM | POA: Diagnosis present

## 2015-12-09 DIAGNOSIS — M25512 Pain in left shoulder: Secondary | ICD-10-CM | POA: Diagnosis not present

## 2015-12-09 DIAGNOSIS — Z888 Allergy status to other drugs, medicaments and biological substances status: Secondary | ICD-10-CM | POA: Diagnosis not present

## 2015-12-09 DIAGNOSIS — I110 Hypertensive heart disease with heart failure: Secondary | ICD-10-CM | POA: Diagnosis present

## 2015-12-09 DIAGNOSIS — I5021 Acute systolic (congestive) heart failure: Secondary | ICD-10-CM | POA: Diagnosis not present

## 2015-12-09 DIAGNOSIS — Z794 Long term (current) use of insulin: Secondary | ICD-10-CM | POA: Diagnosis not present

## 2015-12-09 DIAGNOSIS — G8929 Other chronic pain: Secondary | ICD-10-CM | POA: Diagnosis present

## 2015-12-09 DIAGNOSIS — I7300 Raynaud's syndrome without gangrene: Secondary | ICD-10-CM | POA: Diagnosis present

## 2015-12-09 DIAGNOSIS — Z7982 Long term (current) use of aspirin: Secondary | ICD-10-CM | POA: Diagnosis not present

## 2015-12-09 DIAGNOSIS — Y95 Nosocomial condition: Secondary | ICD-10-CM | POA: Diagnosis present

## 2015-12-09 DIAGNOSIS — A419 Sepsis, unspecified organism: Secondary | ICD-10-CM | POA: Diagnosis present

## 2015-12-09 DIAGNOSIS — R7989 Other specified abnormal findings of blood chemistry: Secondary | ICD-10-CM | POA: Diagnosis not present

## 2015-12-09 DIAGNOSIS — E1140 Type 2 diabetes mellitus with diabetic neuropathy, unspecified: Secondary | ICD-10-CM | POA: Diagnosis present

## 2015-12-09 DIAGNOSIS — Z955 Presence of coronary angioplasty implant and graft: Secondary | ICD-10-CM

## 2015-12-09 DIAGNOSIS — N390 Urinary tract infection, site not specified: Secondary | ICD-10-CM | POA: Diagnosis present

## 2015-12-09 DIAGNOSIS — E785 Hyperlipidemia, unspecified: Secondary | ICD-10-CM | POA: Diagnosis present

## 2015-12-09 DIAGNOSIS — J9811 Atelectasis: Secondary | ICD-10-CM | POA: Diagnosis present

## 2015-12-09 DIAGNOSIS — Z87891 Personal history of nicotine dependence: Secondary | ICD-10-CM

## 2015-12-09 DIAGNOSIS — I10 Essential (primary) hypertension: Secondary | ICD-10-CM | POA: Diagnosis not present

## 2015-12-09 DIAGNOSIS — J189 Pneumonia, unspecified organism: Secondary | ICD-10-CM | POA: Diagnosis present

## 2015-12-09 DIAGNOSIS — Z79899 Other long term (current) drug therapy: Secondary | ICD-10-CM | POA: Diagnosis not present

## 2015-12-09 DIAGNOSIS — Z91013 Allergy to seafood: Secondary | ICD-10-CM | POA: Diagnosis not present

## 2015-12-09 DIAGNOSIS — F419 Anxiety disorder, unspecified: Secondary | ICD-10-CM

## 2015-12-09 DIAGNOSIS — Z91012 Allergy to eggs: Secondary | ICD-10-CM | POA: Diagnosis not present

## 2015-12-09 DIAGNOSIS — Z886 Allergy status to analgesic agent status: Secondary | ICD-10-CM | POA: Diagnosis not present

## 2015-12-09 DIAGNOSIS — J440 Chronic obstructive pulmonary disease with acute lower respiratory infection: Secondary | ICD-10-CM | POA: Diagnosis present

## 2015-12-09 DIAGNOSIS — I169 Hypertensive crisis, unspecified: Secondary | ICD-10-CM | POA: Diagnosis present

## 2015-12-09 DIAGNOSIS — Z89511 Acquired absence of right leg below knee: Secondary | ICD-10-CM

## 2015-12-09 DIAGNOSIS — Z90710 Acquired absence of both cervix and uterus: Secondary | ICD-10-CM | POA: Diagnosis not present

## 2015-12-09 DIAGNOSIS — Z9841 Cataract extraction status, right eye: Secondary | ICD-10-CM

## 2015-12-09 DIAGNOSIS — D649 Anemia, unspecified: Secondary | ICD-10-CM | POA: Diagnosis present

## 2015-12-09 DIAGNOSIS — Z95828 Presence of other vascular implants and grafts: Secondary | ICD-10-CM

## 2015-12-09 DIAGNOSIS — J9601 Acute respiratory failure with hypoxia: Secondary | ICD-10-CM

## 2015-12-09 DIAGNOSIS — D509 Iron deficiency anemia, unspecified: Secondary | ICD-10-CM | POA: Diagnosis present

## 2015-12-09 DIAGNOSIS — E113599 Type 2 diabetes mellitus with proliferative diabetic retinopathy without macular edema, unspecified eye: Secondary | ICD-10-CM | POA: Diagnosis present

## 2015-12-09 DIAGNOSIS — I252 Old myocardial infarction: Secondary | ICD-10-CM

## 2015-12-09 DIAGNOSIS — E118 Type 2 diabetes mellitus with unspecified complications: Secondary | ICD-10-CM | POA: Diagnosis not present

## 2015-12-09 DIAGNOSIS — Z9842 Cataract extraction status, left eye: Secondary | ICD-10-CM | POA: Diagnosis not present

## 2015-12-09 DIAGNOSIS — Z885 Allergy status to narcotic agent status: Secondary | ICD-10-CM | POA: Diagnosis not present

## 2015-12-09 DIAGNOSIS — I5033 Acute on chronic diastolic (congestive) heart failure: Secondary | ICD-10-CM | POA: Diagnosis present

## 2015-12-09 DIAGNOSIS — E871 Hypo-osmolality and hyponatremia: Secondary | ICD-10-CM | POA: Diagnosis present

## 2015-12-09 DIAGNOSIS — S71002A Unspecified open wound, left hip, initial encounter: Secondary | ICD-10-CM | POA: Diagnosis present

## 2015-12-09 DIAGNOSIS — E1152 Type 2 diabetes mellitus with diabetic peripheral angiopathy with gangrene: Secondary | ICD-10-CM | POA: Diagnosis present

## 2015-12-09 DIAGNOSIS — E875 Hyperkalemia: Secondary | ICD-10-CM | POA: Diagnosis present

## 2015-12-09 DIAGNOSIS — N179 Acute kidney failure, unspecified: Secondary | ICD-10-CM | POA: Diagnosis present

## 2015-12-09 DIAGNOSIS — I739 Peripheral vascular disease, unspecified: Secondary | ICD-10-CM | POA: Diagnosis not present

## 2015-12-09 DIAGNOSIS — I509 Heart failure, unspecified: Secondary | ICD-10-CM | POA: Diagnosis not present

## 2015-12-09 DIAGNOSIS — Z4659 Encounter for fitting and adjustment of other gastrointestinal appliance and device: Secondary | ICD-10-CM

## 2015-12-09 DIAGNOSIS — I96 Gangrene, not elsewhere classified: Secondary | ICD-10-CM

## 2015-12-09 DIAGNOSIS — R778 Other specified abnormalities of plasma proteins: Secondary | ICD-10-CM

## 2015-12-09 DIAGNOSIS — Z881 Allergy status to other antibiotic agents status: Secondary | ICD-10-CM | POA: Diagnosis not present

## 2015-12-09 DIAGNOSIS — J811 Chronic pulmonary edema: Secondary | ICD-10-CM

## 2015-12-09 DIAGNOSIS — R0602 Shortness of breath: Secondary | ICD-10-CM

## 2015-12-09 LAB — COMPREHENSIVE METABOLIC PANEL
ALT: 19 U/L (ref 14–54)
AST: 45 U/L — ABNORMAL HIGH (ref 15–41)
Albumin: 2.5 g/dL — ABNORMAL LOW (ref 3.5–5.0)
Alkaline Phosphatase: 94 U/L (ref 38–126)
Anion gap: 13 (ref 5–15)
BUN: 15 mg/dL (ref 6–20)
CO2: 19 mmol/L — ABNORMAL LOW (ref 22–32)
Calcium: 8.6 mg/dL — ABNORMAL LOW (ref 8.9–10.3)
Chloride: 104 mmol/L (ref 101–111)
Creatinine, Ser: 1.14 mg/dL — ABNORMAL HIGH (ref 0.44–1.00)
GFR calc Af Amer: 55 mL/min — ABNORMAL LOW (ref >60)
GFR calc non Af Amer: 48 mL/min — ABNORMAL LOW (ref >60)
Glucose, Bld: 151 mg/dL — ABNORMAL HIGH (ref 65–99)
Potassium: 6.6 mmol/L (ref 3.5–5.1)
Sodium: 136 mmol/L (ref 135–145)
Total Bilirubin: 1.4 mg/dL — ABNORMAL HIGH (ref 0.3–1.2)
Total Protein: 6.2 g/dL — ABNORMAL LOW (ref 6.5–8.1)

## 2015-12-09 LAB — URINE MICROSCOPIC-ADD ON

## 2015-12-09 LAB — URINALYSIS, ROUTINE W REFLEX MICROSCOPIC
BILIRUBIN URINE: NEGATIVE
Glucose, UA: NEGATIVE mg/dL
KETONES UR: NEGATIVE mg/dL
NITRITE: NEGATIVE
PH: 5 (ref 5.0–8.0)
Protein, ur: NEGATIVE mg/dL
SPECIFIC GRAVITY, URINE: 1.014 (ref 1.005–1.030)

## 2015-12-09 LAB — BASIC METABOLIC PANEL
ANION GAP: 9 (ref 5–15)
BUN: 19 mg/dL (ref 6–20)
CHLORIDE: 102 mmol/L (ref 101–111)
CO2: 23 mmol/L (ref 22–32)
CREATININE: 1.13 mg/dL — AB (ref 0.44–1.00)
Calcium: 8.4 mg/dL — ABNORMAL LOW (ref 8.9–10.3)
GFR calc non Af Amer: 48 mL/min — ABNORMAL LOW (ref >60)
GFR, EST AFRICAN AMERICAN: 56 mL/min — AB (ref >60)
Glucose, Bld: 167 mg/dL — ABNORMAL HIGH (ref 65–99)
POTASSIUM: 5.2 mmol/L — AB (ref 3.5–5.1)
SODIUM: 134 mmol/L — AB (ref 135–145)

## 2015-12-09 LAB — GLUCOSE, CAPILLARY
GLUCOSE-CAPILLARY: 146 mg/dL — AB (ref 65–99)
GLUCOSE-CAPILLARY: 131 mg/dL — AB (ref 65–99)
GLUCOSE-CAPILLARY: 154 mg/dL — AB (ref 65–99)

## 2015-12-09 LAB — TROPONIN I: Troponin I: 0.07 ng/mL — ABNORMAL HIGH (ref <0.031)

## 2015-12-09 LAB — I-STAT CG4 LACTIC ACID, ED: Lactic Acid, Venous: 2.34 mmol/L (ref 0.5–2.0)

## 2015-12-09 LAB — CBC WITH DIFFERENTIAL/PLATELET
Basophils Absolute: 0 10*3/uL (ref 0.0–0.1)
Basophils Relative: 0 %
Eosinophils Absolute: 0 10*3/uL (ref 0.0–0.7)
Eosinophils Relative: 0 %
HCT: 28 % — ABNORMAL LOW (ref 36.0–46.0)
Hemoglobin: 9 g/dL — ABNORMAL LOW (ref 12.0–15.0)
Lymphocytes Relative: 2 %
Lymphs Abs: 0.6 10*3/uL — ABNORMAL LOW (ref 0.7–4.0)
MCH: 25.7 pg — ABNORMAL LOW (ref 26.0–34.0)
MCHC: 32.1 g/dL (ref 30.0–36.0)
MCV: 80 fL (ref 78.0–100.0)
Monocytes Absolute: 1.6 10*3/uL — ABNORMAL HIGH (ref 0.1–1.0)
Monocytes Relative: 5 %
Neutro Abs: 29.3 10*3/uL — ABNORMAL HIGH (ref 1.7–7.7)
Neutrophils Relative %: 93 %
Platelets: ADEQUATE 10*3/uL (ref 150–400)
RBC: 3.5 MIL/uL — ABNORMAL LOW (ref 3.87–5.11)
RDW: 17.8 % — ABNORMAL HIGH (ref 11.5–15.5)
WBC: 31.5 10*3/uL — ABNORMAL HIGH (ref 4.0–10.5)

## 2015-12-09 LAB — I-STAT ARTERIAL BLOOD GAS, ED
Acid-base deficit: 1 mmol/L (ref 0.0–2.0)
Bicarbonate: 24.6 mEq/L — ABNORMAL HIGH (ref 20.0–24.0)
O2 Saturation: 56 %
Patient temperature: 98.6
TCO2: 26 mmol/L (ref 0–100)
pCO2 arterial: 43.1 mmHg (ref 35.0–45.0)
pH, Arterial: 7.364 (ref 7.350–7.450)
pO2, Arterial: 31 mmHg — CL (ref 80.0–100.0)

## 2015-12-09 LAB — MRSA PCR SCREENING: MRSA BY PCR: NEGATIVE

## 2015-12-09 LAB — PHOSPHORUS: PHOSPHORUS: 4.4 mg/dL (ref 2.5–4.6)

## 2015-12-09 LAB — POCT I-STAT 3, VENOUS BLOOD GAS (G3P V)
ACID-BASE DEFICIT: 3 mmol/L — AB (ref 0.0–2.0)
Bicarbonate: 21.9 mEq/L (ref 20.0–24.0)
O2 Saturation: 38 %
PH VEN: 7.378 — AB (ref 7.250–7.300)
TCO2: 23 mmol/L (ref 0–100)
pCO2, Ven: 37.1 mmHg — ABNORMAL LOW (ref 45.0–50.0)
pO2, Ven: 23 mmHg — ABNORMAL LOW (ref 31.0–45.0)

## 2015-12-09 LAB — MAGNESIUM: Magnesium: 1.5 mg/dL — ABNORMAL LOW (ref 1.7–2.4)

## 2015-12-09 MED ORDER — MIDAZOLAM HCL 2 MG/2ML IJ SOLN
2.0000 mg | Freq: Once | INTRAMUSCULAR | Status: AC
  Administered 2015-12-09: 2 mg via INTRAVENOUS

## 2015-12-09 MED ORDER — FAMOTIDINE IN NACL 20-0.9 MG/50ML-% IV SOLN
20.0000 mg | Freq: Two times a day (BID) | INTRAVENOUS | Status: DC
  Administered 2015-12-10: 20 mg via INTRAVENOUS
  Filled 2015-12-09 (×3): qty 50

## 2015-12-09 MED ORDER — NITROGLYCERIN IN D5W 200-5 MCG/ML-% IV SOLN
5.0000 ug/min | INTRAVENOUS | Status: DC
  Administered 2015-12-09: 5 ug/min via INTRAVENOUS
  Filled 2015-12-09: qty 250

## 2015-12-09 MED ORDER — ASPIRIN EC 81 MG PO TBEC
81.0000 mg | DELAYED_RELEASE_TABLET | Freq: Every day | ORAL | Status: DC
  Administered 2015-12-09 – 2015-12-14 (×6): 81 mg via ORAL
  Filled 2015-12-09 (×6): qty 1

## 2015-12-09 MED ORDER — ENOXAPARIN SODIUM 40 MG/0.4ML ~~LOC~~ SOLN
40.0000 mg | SUBCUTANEOUS | Status: DC
  Administered 2015-12-09 – 2015-12-10 (×2): 40 mg via SUBCUTANEOUS
  Filled 2015-12-09 (×2): qty 0.4

## 2015-12-09 MED ORDER — INSULIN ASPART 100 UNIT/ML ~~LOC~~ SOLN: 0.0000 [IU] | Freq: Three times a day (TID) | SUBCUTANEOUS | Status: DC

## 2015-12-09 MED ORDER — MIDAZOLAM HCL 2 MG/2ML IJ SOLN
1.0000 mg | INTRAMUSCULAR | Status: DC | PRN
Start: 2015-12-09 — End: 2015-12-10

## 2015-12-09 MED ORDER — NOREPINEPHRINE BITARTRATE 1 MG/ML IV SOLN
2.0000 ug/min | INTRAVENOUS | Status: DC
  Filled 2015-12-09: qty 4

## 2015-12-09 MED ORDER — DEXTROSE 5 % IV SOLN: 2.0000 g | Freq: Once | INTRAVENOUS | Status: DC

## 2015-12-09 MED ORDER — INSULIN GLARGINE 100 UNIT/ML ~~LOC~~ SOLN
14.0000 [IU] | Freq: Every day | SUBCUTANEOUS | Status: DC
  Administered 2015-12-10: 14 [IU] via SUBCUTANEOUS
  Filled 2015-12-09 (×3): qty 0.14

## 2015-12-09 MED ORDER — NITROGLYCERIN 0.4 MG SL SUBL: 0.4000 mg | SUBLINGUAL_TABLET | SUBLINGUAL | Status: DC | PRN

## 2015-12-09 MED ORDER — CHLORHEXIDINE GLUCONATE 0.12% ORAL RINSE (MEDLINE KIT)
15.0000 mL | Freq: Two times a day (BID) | OROMUCOSAL | Status: DC
  Administered 2015-12-10 – 2015-12-11 (×3): 15 mL via OROMUCOSAL

## 2015-12-09 MED ORDER — ROSUVASTATIN CALCIUM 10 MG PO TABS
10.0000 mg | ORAL_TABLET | ORAL | Status: DC
  Administered 2015-12-14: 10 mg via ORAL
  Filled 2015-12-09: qty 1

## 2015-12-09 MED ORDER — ANTISEPTIC ORAL RINSE SOLUTION (CORINZ)
7.0000 mL | Freq: Four times a day (QID) | OROMUCOSAL | Status: DC
  Administered 2015-12-10 – 2015-12-11 (×6): 7 mL via OROMUCOSAL

## 2015-12-09 MED ORDER — ONDANSETRON HCL 4 MG/2ML IJ SOLN
4.0000 mg | Freq: Four times a day (QID) | INTRAMUSCULAR | Status: DC | PRN
  Administered 2015-12-11: 4 mg via INTRAVENOUS
  Filled 2015-12-09: qty 2

## 2015-12-09 MED ORDER — LEVOFLOXACIN IN D5W 750 MG/150ML IV SOLN
750.0000 mg | Freq: Once | INTRAVENOUS | Status: AC
  Administered 2015-12-09: 750 mg via INTRAVENOUS
  Filled 2015-12-09: qty 150

## 2015-12-09 MED ORDER — SODIUM CHLORIDE 0.9 % IV BOLUS (SEPSIS)
500.0000 mL | Freq: Once | INTRAVENOUS | Status: AC
  Administered 2015-12-09: 500 mL via INTRAVENOUS

## 2015-12-09 MED ORDER — ETOMIDATE 2 MG/ML IV SOLN
0.3000 mg/kg | Freq: Once | INTRAVENOUS | Status: AC
  Administered 2015-12-09: 19.06 mg via INTRAVENOUS

## 2015-12-09 MED ORDER — FENTANYL CITRATE (PF) 100 MCG/2ML IJ SOLN
100.0000 ug | Freq: Once | INTRAMUSCULAR | Status: AC
  Administered 2015-12-09: 100 ug via INTRAVENOUS

## 2015-12-09 MED ORDER — INSULIN ASPART 100 UNIT/ML ~~LOC~~ SOLN: 0.0000 [IU] | Freq: Every day | SUBCUTANEOUS | Status: DC

## 2015-12-09 MED ORDER — EZETIMIBE 10 MG PO TABS
10.0000 mg | ORAL_TABLET | Freq: Every day | ORAL | Status: DC
  Administered 2015-12-09 – 2015-12-14 (×6): 10 mg via ORAL
  Filled 2015-12-09 (×6): qty 1

## 2015-12-09 MED ORDER — ALBUTEROL SULFATE (2.5 MG/3ML) 0.083% IN NEBU: 2.5000 mg | INHALATION_SOLUTION | RESPIRATORY_TRACT | Status: DC | PRN

## 2015-12-09 MED ORDER — RISAQUAD PO CAPS
1.0000 | ORAL_CAPSULE | Freq: Every day | ORAL | Status: DC
  Administered 2015-12-09: 1 via ORAL
  Filled 2015-12-09 (×2): qty 1

## 2015-12-09 MED ORDER — DEXTROSE 5 % IV SOLN
2.0000 g | Freq: Two times a day (BID) | INTRAVENOUS | Status: DC
  Administered 2015-12-09 – 2015-12-10 (×3): 2 g via INTRAVENOUS
  Filled 2015-12-09 (×6): qty 2

## 2015-12-09 MED ORDER — FENTANYL BOLUS VIA INFUSION
25.0000 ug | INTRAVENOUS | Status: DC | PRN
  Administered 2015-12-09: 25 ug via INTRAVENOUS
  Filled 2015-12-09: qty 25

## 2015-12-09 MED ORDER — VITAL HIGH PROTEIN PO LIQD
1000.0000 mL | ORAL | Status: DC
  Administered 2015-12-10: 1000 mL

## 2015-12-09 MED ORDER — PRO-STAT SUGAR FREE PO LIQD
30.0000 mL | Freq: Two times a day (BID) | ORAL | Status: DC
  Administered 2015-12-10 (×2): 30 mL
  Filled 2015-12-09 (×2): qty 30

## 2015-12-09 MED ORDER — INSULIN DEGLUDEC 100 UNIT/ML ~~LOC~~ SOPN: 14.0000 [IU] | PEN_INJECTOR | Freq: Every day | SUBCUTANEOUS | Status: DC

## 2015-12-09 MED ORDER — FENTANYL CITRATE (PF) 100 MCG/2ML IJ SOLN: 50.0000 ug | Freq: Once | INTRAMUSCULAR | Status: DC

## 2015-12-09 MED ORDER — ISOSORBIDE MONONITRATE ER 30 MG PO TB24
30.0000 mg | ORAL_TABLET | Freq: Every day | ORAL | Status: DC
Start: 2015-12-10 — End: 2015-12-14
  Administered 2015-12-11 – 2015-12-14 (×4): 30 mg via ORAL
  Filled 2015-12-09 (×5): qty 1

## 2015-12-09 MED ORDER — ALPRAZOLAM 0.25 MG PO TABS
0.2500 mg | ORAL_TABLET | Freq: Two times a day (BID) | ORAL | Status: DC
  Administered 2015-12-09 – 2015-12-14 (×11): 0.25 mg via ORAL
  Filled 2015-12-09 (×11): qty 1

## 2015-12-09 MED ORDER — FUROSEMIDE 10 MG/ML IJ SOLN
40.0000 mg | Freq: Two times a day (BID) | INTRAMUSCULAR | Status: DC
  Administered 2015-12-09 – 2015-12-10 (×2): 40 mg via INTRAVENOUS
  Filled 2015-12-09 (×2): qty 4

## 2015-12-09 MED ORDER — RAMIPRIL 1.25 MG PO CAPS
1.2500 mg | ORAL_CAPSULE | Freq: Every day | ORAL | Status: DC
  Filled 2015-12-09: qty 1

## 2015-12-09 MED ORDER — SODIUM CHLORIDE 0.9 % IV SOLN
25.0000 ug/h | INTRAVENOUS | Status: DC
  Administered 2015-12-09: 50 ug/h via INTRAVENOUS
  Administered 2015-12-10 – 2015-12-11 (×2): 200 ug/h via INTRAVENOUS
  Filled 2015-12-09 (×4): qty 50

## 2015-12-09 MED ORDER — INSULIN ASPART 100 UNIT/ML ~~LOC~~ SOLN
0.0000 [IU] | SUBCUTANEOUS | Status: DC
  Administered 2015-12-11 – 2015-12-12 (×7): 3 [IU] via SUBCUTANEOUS
  Administered 2015-12-13: 2 [IU] via SUBCUTANEOUS
  Administered 2015-12-13: 3 [IU] via SUBCUTANEOUS

## 2015-12-09 MED ORDER — ONDANSETRON HCL 4 MG PO TABS: 4.0000 mg | ORAL_TABLET | Freq: Four times a day (QID) | ORAL | Status: DC | PRN

## 2015-12-09 MED ORDER — OMEGA-3-ACID ETHYL ESTERS 1 G PO CAPS
2.0000 g | ORAL_CAPSULE | Freq: Two times a day (BID) | ORAL | Status: DC
  Administered 2015-12-12 – 2015-12-14 (×4): 2 g via ORAL
  Filled 2015-12-09 (×10): qty 2

## 2015-12-09 MED ORDER — VANCOMYCIN HCL IN DEXTROSE 1-5 GM/200ML-% IV SOLN
1000.0000 mg | INTRAVENOUS | Status: DC
  Administered 2015-12-09 – 2015-12-10 (×2): 1000 mg via INTRAVENOUS
  Filled 2015-12-09 (×2): qty 200

## 2015-12-09 MED ORDER — PANTOPRAZOLE SODIUM 40 MG PO TBEC
40.0000 mg | DELAYED_RELEASE_TABLET | Freq: Every day | ORAL | Status: DC
  Administered 2015-12-09 – 2015-12-12 (×3): 40 mg via ORAL
  Filled 2015-12-09 (×4): qty 1

## 2015-12-09 MED ORDER — ROCURONIUM BROMIDE 50 MG/5ML IV SOLN
1.0000 mg/kg | Freq: Once | INTRAVENOUS | Status: AC
  Administered 2015-12-09: 63.5 mg via INTRAVENOUS

## 2015-12-09 MED ORDER — VANCOMYCIN HCL IN DEXTROSE 1-5 GM/200ML-% IV SOLN: 1000.0000 mg | Freq: Once | INTRAVENOUS | Status: DC

## 2015-12-09 MED ORDER — METOPROLOL SUCCINATE ER 50 MG PO TB24
50.0000 mg | ORAL_TABLET | Freq: Every day | ORAL | Status: DC
  Administered 2015-12-11 – 2015-12-14 (×4): 50 mg via ORAL
  Filled 2015-12-09 (×8): qty 1

## 2015-12-09 MED ORDER — DOCUSATE SODIUM 50 MG/5ML PO LIQD: 100.0000 mg | Freq: Two times a day (BID) | ORAL | Status: DC | PRN

## 2015-12-09 MED ORDER — SODIUM CHLORIDE 0.9% FLUSH
3.0000 mL | Freq: Two times a day (BID) | INTRAVENOUS | Status: DC
  Administered 2015-12-09 – 2015-12-14 (×6): 3 mL via INTRAVENOUS

## 2015-12-09 MED ORDER — FUROSEMIDE 10 MG/ML IJ SOLN
40.0000 mg | Freq: Once | INTRAMUSCULAR | Status: AC
  Administered 2015-12-09: 40 mg via INTRAVENOUS
  Filled 2015-12-09: qty 4

## 2015-12-09 MED ORDER — DEXTROSE 5 % IV SOLN
2.0000 g | Freq: Three times a day (TID) | INTRAVENOUS | Status: DC
  Filled 2015-12-09 (×3): qty 2

## 2015-12-09 MED ORDER — MEPERIDINE HCL 50 MG PO TABS: 50.0000 mg | ORAL_TABLET | ORAL | Status: DC | PRN

## 2015-12-09 MED ORDER — MIDAZOLAM HCL 2 MG/2ML IJ SOLN
1.0000 mg | INTRAMUSCULAR | Status: DC | PRN
  Administered 2015-12-10: 1 mg via INTRAVENOUS
  Filled 2015-12-09: qty 2

## 2015-12-09 MED ORDER — SODIUM CHLORIDE 0.9 % IV SOLN
INTRAVENOUS | Status: DC
  Administered 2015-12-09 (×2): via INTRAVENOUS

## 2015-12-09 NOTE — Progress Notes (Signed)
RT attempted ABG, was unsuccessful. Family Member stated that we are not allowed to try again under any circumstance. MD and RN aware.

## 2015-12-09 NOTE — Procedures (Signed)
Intubation Procedure Note [name] [id number] [birth year]  Procedure: Intubation Indications: Respiratory insufficiency  Procedure Details Consent: Risks of procedure as well as the alternatives and risks of each were explained to the (patient/caregiver).  Consent for procedure obtained. Time Out: Verified patient identification, verified procedure, site/side was marked, verified correct patient position, special equipment/implants available, medications/allergies/relevent history reviewed, required imaging and test results available.  Performed  Maximum sterile technique was used including antiseptics, cap, gloves, gown, hand hygiene, mask and sheet.  MAC and 3 Glidescope  Medications: fentanyl 2mg  versed 20mg  etomidate 60mg  rocuronium  Grade 1 airway view. Visualized ETT passing through vocal cords. + color change on EZcap. Breath sounds auscultated in all lung fields.    Evaluation Hemodynamic Status: BP stable throughout; O2 sats: stable throughout Patient's Current Condition: stable Complications: No apparent complications Patient did tolerate procedure well. Chest X-ray ordered to verify placement.  CXR: pending.  Joneen Roach, AGACNP-BC [facility] Pulmonology/Critical Care Pager [phone] or [phone]  12/09/2015 7:59 PM

## 2015-12-09 NOTE — Progress Notes (Signed)
Results from 1308 arterial blood gas appear to be venous blood gas. Will change results in the computer on 01/09/2016. Dr. Valetta Fuller is aware.

## 2015-12-09 NOTE — ED Notes (Signed)
Bi-pap removed per admitting MD. Pt is also able to eat, will provide a [place] sandwich.

## 2015-12-09 NOTE — ED Notes (Signed)
Attempted IV start, pt's family refusing RN try any IV sticks reporting "she must have a PICC line." I informed them that we need to start more medications and need a second IV line as soon as possible. Family agreed to allow 1 IV stick and to page IV team if unsuccessful.

## 2015-12-09 NOTE — Consult Note (Signed)
PULMONARY / CRITICAL CARE MEDICINE   Name: [name] MRN: [id number] DOB: 08/08/1945    ADMISSION DATE:  12/09/2015 CONSULTATION DATE:  6/7  REFERRING MD:  [name] TRH  CHIEF COMPLAINT:  SOB  HISTORY OF PRESENT ILLNESS:   70 year old female past medical history as below, which includes severe PVD (s/p s/p R BKA in 2013, L fem-pop bypass in 2009, aortobifemoral bypass in 2013, recent left fem-pop above knee to below-knee popliteal bypass on 08/19/2015), CAD (PCI of LCx in 2010, cath in 2013 showing patency of LCx stent and mild nonobstructive CAD in LAD and normal LV function), DM2, Diastolic CHF, subclavian steal syndrome, and Anemia.   She presented to [facility] emergency department 6/7 with complaints of cough/shortness of breath 24 hours. She describes cough is minimally productive and is unable to describe character sputum. Due to not feeling well, she slept in a different room that her husband the night prior. At 5 AM her husband could hear her from the other room having very labored breathing. When he got to her she was lethargic and very dyspneic. EMS transported patient to [facility] emergency department where she was started on BiPAP for increased work of breathing. Chest x-ray was consistent with pulmonary edema versus pneumonia. She was value by cardiology in the emergency department who recommended low-dose Lasix for diuresis. They felt as though pneumonia may be causing a significant portion of her symptoms as well.  PAST MEDICAL HISTORY :  She  has a past medical history of Hypertension; Hyperlipidemia; Tobacco abuse; Loose, teeth; PONV (postoperative nausea and vomiting); Type II diabetes mellitus ([place]); GERD (gastroesophageal reflux disease); Anxiety; Peripheral vascular disease ([place]); Raynaud disease; Stones in the urinary tract; UTI (urinary tract infection); Anemia; Coronary artery disease; Myocardial infarction [facility]); CHF (congestive heart failure) ([place]); Kidney stones;  UTI (lower urinary tract infection); Constipation; Hernia, umbilical; History of blood transfusion; Fall from slipping on wet surface (Jan. 7, 2014); Carotid artery occlusion; Proliferative diabetic retinopathy of right eye ([place]); Subclavian steal syndrome; Diabetic neuropathy with neurologic complication ([place]); History of right below knee amputation ([place]); Vitamin D deficiency; and Goiter.  PAST SURGICAL HISTORY: She  has past surgical history that includes Vesicovaginal fistula closure w/ TAH; Colonoscopy (11/2010); Upper gastrointestinal endoscopy (11/2010); Femoral artery - popliteal artery bypass graft; Abdominal hysterectomy; Femoral-popliteal Bypass Graft (08/22/2011); Intraoperative arteriogram (08/22/2011); Multiple tooth extractions (08-29-2011); pci (01/17/12); Breast lumpectomy; Tonsillectomy and adenoidectomy; Coronary angioplasty with stent; Carotid endarterectomy (~ 2005); Aorta - bilateral femoral artery bypass graft (03/26/2012); Umbilical hernia repair (03/26/2012); Femoral artery debridement (03/26/2012); Amputation (04/23/2012); Eye surgery (Right, Feb. 9, 2015); Eye surgery (Left, Feb. 16, 2015); abdominal aortagram (N/A, 05/17/2011); lower extremity angiogram (05/17/2011); percutaneous stent intervention (Right, 05/17/2011); lower extremity angiogram (N/A, 09/21/2011); abdominal angiogram (09/21/2011); abdominal aortagram (N/A, 01/17/2012); left heart catheterization with coronary angiogram (N/A, 04/05/2012); Cardiac catheterization (N/A, 06/08/2015); Hernia repair; and Femoral-tibial Bypass Graft (Left, 08/19/2015).  Allergies  Allergen Reactions  . Acetaminophen Nausea Only and Other (See Comments)    Does not tolerate well, nausea  . Ceftriaxone Itching    Pt has tolerated amoxicillin and cephalexin in the past.  . Codeine Nausea And Vomiting  . Erythromycin Nausea And Vomiting  . Hydrocodone-Acetaminophen Nausea And Vomiting  . Hydromorphone Nausea And Vomiting  . Propoxyphene Hcl Nausea  And Vomiting  . Statins Other (See Comments)    Leg myalgias  . Eggs Or Egg-Derived Products Other (See Comments)    "pt does not eat" Unspecified reaction  . Ibuprofen Other (  See Comments)    Unspecified  Per Pt "Does not tolerate well"  . Shellfish-Derived Products Other (See Comments)    "pt does not eat" Unspecified reaction    No current facility-administered medications on file prior to encounter.   Current Outpatient Prescriptions on File Prior to Encounter  Medication Sig  . ALPRAZolam (XANAX) 0.25 MG tablet Take 0.25 mg by mouth 2 (two) times daily.  Marland Kitchen aspirin EC 81 MG tablet Take 1 tablet (81 mg total) by mouth daily.  . calcium carbonate (TUMS - DOSED IN MG ELEMENTAL CALCIUM) 500 MG chewable tablet Chew 1,000 mg by mouth daily as needed for indigestion or heartburn.  . dexlansoprazole (DEXILANT) 60 MG capsule Take 60 mg by mouth 2 (two) times daily.  . ergocalciferol (VITAMIN D2) 50000 units capsule Take 50,000 Units by mouth 3 (three) times a week.  . ezetimibe (ZETIA) 10 MG tablet TAKE 1 TABLET BY MOUTH EVERY DAY  . furosemide (LASIX) 40 MG tablet Take 1 tablet (40 mg total) by mouth daily.  . Insulin Degludec (TRESIBA FLEXTOUCH) 100 UNIT/ML SOPN Inject 14 Units into the skin daily.   . iron polysaccharides (NIFEREX) 150 MG capsule Take 450 mg by mouth 2 (two) times daily.   . isosorbide mononitrate (IMDUR) 30 MG 24 hr tablet Take 30 mg by mouth daily.  . meperidine (DEMEROL) 50 MG tablet Take 1-2 tablets (50-100 mg total) by mouth every 4 (four) hours as needed for severe pain. (Patient taking differently: Take 50 mg by mouth. Take one tablets every 4 hours as needed for pain (1-5);  Take two tablets every 4 hours as needed for pain (6-10))  . metoprolol succinate (TOPROL-XL) 50 MG 24 hr tablet Take 1 tablet (50 mg total) by mouth daily. With 72m tablet (for 727mtotal).  Take with or immediately following a meal. (Patient taking differently: Take 50 mg by mouth daily. )   . nitroGLYCERIN (NITROSTAT) 0.4 MG SL tablet Place 1 tablet (0.4 mg total) under the tongue every 5 (five) minutes as needed. For chest pain  . omega-3 acid ethyl esters (LOVAZA) 1 G capsule Take 2 g by mouth 2 (two) times daily.   . pioglitazone-metformin (ACTOPLUS MET) 15-850 MG per tablet Take 1 tablet by mouth daily. Brand name only-does not work if components are given separately per patient 15-850 MG  . potassium chloride (K-DUR) 10 MEQ tablet Take 1 tablet (10 mEq total) by mouth 2 (two) times daily.  . ramipril (ALTACE) 1.25 MG capsule Take 1.25 mg by mouth daily. BRAND NAME ONLY  . rosuvastatin (CRESTOR) 10 MG tablet Take 10 mg by mouth every Monday.   . sitaGLIPtan (JANUVIA) 100 MG tablet Take 100 mg by mouth at bedtime.   . sodium chloride (OCEAN) 0.65 % SOLN nasal spray Place 2-4 sprays into both nostrils every 4 (four) hours as needed (for nose bleeds).  . prochlorperazine (COMPAZINE) 10 MG tablet Take 1 tablet (10 mg total) by mouth every 6 (six) hours as needed. For nausea (Patient not taking: Reported on 12/09/2015)    FAMILY HISTORY:  Her indicated that her mother is deceased. She indicated that her father is deceased. She indicated that both of her brothers are alive. She indicated that her daughter is alive. She indicated that her son is alive.   SOCIAL HISTORY: She  reports that she quit smoking about 14 years ago. Her smoking use included Cigarettes. She has a 78 pack-year smoking history. She has never used smokeless tobacco. She reports  that she drinks about 9.0 oz of alcohol per week. She reports that she does not use illicit drugs.  REVIEW OF SYSTEMS:   Cannot obtain due to BIPAP  SUBJECTIVE:  As above  VITAL SIGNS: BP 137/66 mmHg  Pulse 115  Temp(Src) 98.2 F (36.8 C) (Axillary)  Resp 28  Ht 5' 4"  (1.626 m)  Wt 63.5 kg (139 lb 15.9 oz)  BMI 24.02 kg/m2  SpO2 96%  HEMODYNAMICS:    VENTILATOR SETTINGS:    INTAKE / OUTPUT: I/O last 3 completed  shifts: In: 273 [I.V.:73; IV Piggyback:200] Out: 625 [Urine:625]  PHYSICAL EXAMINATION: General:  Chronically ill appearing, tachypneic on vent Neuro:  Awake, answering questions HEENT:  NCAT, BIPAP mask in place Cardiovascular:  Tachy cardic, regularly rate and rhythm Lungs:  Crackles bilaterally, worse in the bases, increased work of breathing Abdomen:  BS+, soft, nontender Musculoskeletal:  S/p BKA R leg Skin:  Wound L hip, well dressed  LABS:  BMET  Recent Labs Lab 12/09/15 0734  NA 136  K 6.6*  CL 104  CO2 19*  BUN 15  CREATININE 1.14*  GLUCOSE 151*    Electrolytes  Recent Labs Lab 12/09/15 0734  CALCIUM 8.6*    CBC  Recent Labs Lab 12/09/15 0734  WBC 31.5*  HGB 9.0*  HCT 28.0*  PLT PLATELET CLUMPS NOTED ON SMEAR, COUNT APPEARS ADEQUATE    Coag's No results for input(s): APTT, INR in the last 168 hours.  Sepsis Markers  Recent Labs Lab 12/09/15 0736  LATICACIDVEN 2.34*    ABG  Recent Labs Lab 12/09/15 1308  PHART 7.364  PCO2ART 43.1  PO2ART 31.0*    Liver Enzymes  Recent Labs Lab 12/09/15 0734  AST 45*  ALT 19  ALKPHOS 94  BILITOT 1.4*  ALBUMIN 2.5*    Cardiac Enzymes  Recent Labs Lab 12/09/15 0734  TROPONINI 0.07*    Glucose  Recent Labs Lab 12/04/15 0918 12/04/15 1132 12/07/15 0915 12/07/15 1124 12/08/15 0932 12/08/15 1145  GLUCAP 204* 166* 219* 147* 146* 131*    Imaging Dg Chest Port 1 View  12/09/2015  CLINICAL DATA:  Shortness of breath for 1 day EXAM: PORTABLE CHEST 1 VIEW COMPARISON:  Study obtained earlier in the day FINDINGS: There has been progression of interstitial and alveolar edema diffusely. Heart is enlarged with pulmonary venous hypertension. Atherosclerotic calcification is noted in the aorta. No adenopathy. IMPRESSION: Evidence of progression of congestive heart failure. There may be a degree of superimposed ARDS. Electronically Signed   By: Lowella Grip III M.D.   On: 12/09/2015 19:17    Dg Chest Portable 1 View  12/09/2015  CLINICAL DATA:  Shortness of breath since 5 a.m.; history of CHF and coronary artery disease with angioplasty and stent placement, peripheral vascular disease, diabetes ; former heavy smoker. EXAM: PORTABLE CHEST 1 VIEW COMPARISON:  Portable chest x-ray of June 05, 2015 FINDINGS: The lungs remain hyperinflated. The interstitial markings are increased diffusely. The cardiac silhouette is mildly enlarged. The central pulmonary vascularity is prominent. There is no pleural effusion or pneumothorax. The trachea is midline. IMPRESSION: COPD with superimposed CHF. Atelectasis or developing pneumonia in the lower lobes bilaterally is present. There is no pleural effusion or pneumothorax. Electronically Signed   By: David  Martinique M.D.   On: 12/09/2015 07:36     STUDIES:    CULTURES: 6/7 Blood >  6/7 resp >   ANTIBIOTICS: Vanc 6/7 >  Cefepime 6/7> Levaquin 6/7 >    SIGNIFICANT EVENTS:  Moved to ICU 6/7 (day of admission)  LINES/TUBES: 6/7 ETT >  6/7 CVL >  DISCUSSION: 70 y/o female with multiple comorbid illnesses including significant peripheral vascular disease, CAD is here now with acute hypoxemic respiratory failure likely due to HCAP based on her elevated WBC and cough at home.  The ddx includes acute pulmonary edema from decompensated heart failure exacerbation but this seems less likely now considering the progression of her respiratory failure despite documented diuresis today.  Her husband changed her code status after lengthy conversation with Dr. Marily Memos this evening.  She is now full code.  ASSESSMENT / PLAN:  PULMONARY A: Acute respiratory failure with hypoxemia HCAP P:   Intubate now Full vent support VAP prevention bundle Daily SBT/WUA CXR after intubation and again in AM ABG after intubation  CARDIOVASCULAR A:  CAD Peripheral vascular disease Chronic diastolic heart failure > possible based on Cardiology notes P:   Continue lasix as per cardiology Echo per cardiology Telemetry monitoring CHF meds per cardiology CVP monitoring  RENAL A:   Hyperkalemia> hemolyzed  P:   Repeat BMET now Monitor BMET and UOP Replace electrolytes as needed   GASTROINTESTINAL A:   No acute issues P:   Start tube feedings OG tube Famotidine for GI prophylaxis  HEMATOLOGIC A:   No acute issues P:  Monitor for bleeding  INFECTIOUS A:   HCAP Chronic wounds P:   Wound care Antibiotics and cultures as above Send respiratory culture Check U/A  ENDOCRINE A:   DM2 P:   SSI  NEUROLOGIC A:   No acute issues Sedation needs for vent synchrony P:   RASS goal: -2 Fentanyl gtt Prn versed   FAMILY  - Updates: family updated extensively by primary service earlier 6/7. Overall prognosis poor if this turns to ARDS, will watch CXR findings carefully with diuresis.  - Inter-disciplinary family meet or Palliative Care meeting due by:  6/14  My cc time 40 minutes  Roselie Awkward, MD [place] [facility] Pager: [phone] Cell: [phone] After 3pm or if no response, call [phone]  12/09/2015, 7:26 PM

## 2015-12-09 NOTE — ED Notes (Signed)
IV team at bedside 

## 2015-12-09 NOTE — ED Notes (Signed)
Pt's family refusing for respiratory therapist to collect ABG.

## 2015-12-09 NOTE — Procedures (Addendum)
Unable to obtain ABG x 2.  Elink notified and Dr. Kendrick Fries notified.  Orders to hold further ABG attempts. Attempted to get sputum sample and was unsuccessful.

## 2015-12-09 NOTE — Progress Notes (Signed)
Pt placed back on Bi-PAP.  Increasing respiratory distress noted.  MD notified by RN.

## 2015-12-09 NOTE — ED Notes (Signed)
Tried patient on a nasal cannula. Pt maintained O2 saturation levels of 97% but reports she felt like her breathing was better on the bi-pap. Respiratory to come replace bi-pap.

## 2015-12-09 NOTE — Progress Notes (Signed)
Order received for bedside PICC placement by IV Team. Nehemiah Settle RN notified that IV Team does not place her PICC's, IR does.

## 2015-12-09 NOTE — ED Notes (Signed)
PA at bedside.

## 2015-12-09 NOTE — ED Provider Notes (Signed)
Medical screening examination/treatment/procedure(s) were conducted as a shared visit with non-physician practitioner(s) and myself.  I personally evaluated the patient during the encounter.   EKG Interpretation   Date/Time:  Wednesday December 09 2015 06:52:09 EDT Ventricular Rate:  110 PR Interval:  114 QRS Duration: 110 QT Interval:  348 QTC Calculation: 471 R Axis:   71 Text Interpretation:  Poor quality data, interpretation may be affected  Sinus tachycardia Paired ventricular premature complexes LAE, consider  biatrial enlargement Incomplete left bundle branch block Borderline low  voltage, extremity leads Probable anterolateral infarct, old Artifact in  lead(s) I III aVR aVL aVF V1 V2 V3 V4 V5 V6 Poor data quality No obvious  acute ischemia Confirmed by Wilkie Aye  MD, Koden Hunzeker ([id number]) on 12/09/2015  7:03:43 AM      EKG Interpretation  Date/Time:  Wednesday December 09 2015 07:48:32 EDT Ventricular Rate:  87 PR Interval:  146 QRS Duration: 88 QT Interval:  352 QTC Calculation: 423 R Axis:   52 Text Interpretation:  Sinus rhythm Atrial premature complexes Probable left atrial enlargement Borderline low voltage, extremity leads Nonspecific T abnormalities, lateral leads Confirmed by Liba Hulsey  MD, Toni Amend ([id number]) on 12/09/2015 7:59:50 AM        This a 70 year old female with a history of CHF who presents with shortness of breath and increased work of breathing. Was found by her husband with increased work of breathing and accessory muscle use. Placed on C Pap by EMS. Denies chest pain. Reports increasing lower extremity swelling and shortness of breath. No fever.  On initial evaluation, she is mildly tachypnea with accessory muscle use. Coarse breath sounds bilaterally. She is on BiPAP. 1+ lower extremity swelling.  BiPAP continued. Lab work obtained. We'll place on nitroglycerin. Chest x-ray shows evidence of vascular congestion pneumonia can also not be ruled out. EKG is nonischemic. Lab  work is pending. Anticipate admission.  Shon Baton, MD 12/09/15 0800

## 2015-12-09 NOTE — ED Notes (Signed)
Attempted report 

## 2015-12-09 NOTE — Progress Notes (Signed)
No relief on BiPAP. Patient requesting increased oxygen while on BiPAP. Becoming increasingly diaphoretic. Lung sounds wet and congested. 6 pm dose of Lasix given and paged Dr. Konrad Dolores to make aware of situation.   Noe Gens, RN

## 2015-12-09 NOTE — ED Notes (Signed)
IV team attempted to start peripheral IV. Family members will not allow staff to attempt to obtain IV access. Family member requesting PICC line. EDP aware.

## 2015-12-09 NOTE — Progress Notes (Signed)
Patient respiratory status not improving. Patient found to be in Respiratory Failure. Pt unable to relax and continues to have increased WOB and SOB on BiPAP at 60 percent with 8 PEEP. Per MD and family member discussions, patient will be transferred to ICU for intubation and central line placement. Report given to [place], [place] on 2M01. CCMD notified of transfer. Patient transferred via bed on BiPAP with RT. Family at bedside transferred to waiting area.   Noe Gens, RN

## 2015-12-09 NOTE — H&P (Addendum)
History and Physical    [place] MWN:[id number] DOB: 08/03/1945 DOA: 12/09/2015  PCP: Sheela Stack, MD Patient coming from: Home  Chief Complaint: AMS and difficulty breathing  HPI: [name] is a 70 y.o. female with medical history significant of HTN, HLD, DM, GERD, Raynaud's disease,  PVD, nephrolithiasis, anemia, CAD, MI, CHF, diabetic neuropathy, presenting w/ AMS and difficulty breathing.   Difficult to fully ascertain history as patient is actively on BiPAP. History obtaining assisted by patient's daughter. Patient developed mild cough 1 day ago. Patient was sleeping in a separate room from her husband last night. She was sleeping in a recliner. A partially 5 AM this morning patient husband heard the patient in the other room with very difficult, labored breathing. He went to check on the patient and found her very somnolent and breathing very hard. Prior department was called to evaluate the patient. Patient wasn't noted to be hypoxic and was brought to the emergency room for further evaluation and treatment. Patient states that prior to this she was in her normal state of health and denies any fevers, nausea, vomiting, chest pain, palpitations, diarrhea, constipation. Of note patient does go to the wound clinic 5 days a week for hyperbaric chamber treatment for her chronic left hip wound and dry gangrenous toes.  L hip wound. Chronically. Goes for hyperbaric chamber treatments 5x wkly. Next scheduled dressing change on Friday.    ED Course: Patient arrived in the ED on BiPAP and this was continued.  Review of Systems: As per HPI otherwise 10 point review of systems negative.   Ambulatory Status: minimal ambulation due to RBKA  Past Medical History  Diagnosis Date  . Hypertension     Unspecified  . Hyperlipidemia     Mixed  . Tobacco abuse     Remote  . Loose, teeth     has loose bridge and two loose teeth holding it  . PONV (postoperative nausea and vomiting)    . Type II diabetes mellitus ([place])   . GERD (gastroesophageal reflux disease)   . Anxiety   . Peripheral vascular disease (HCC)     unspecified, a. s/p L CEA b. s/p B fem-pop bypass c. s/p R BKA  . Raynaud disease   . Stones in the urinary tract   . UTI (urinary tract infection)   . Anemia     hx  . Coronary artery disease     a. s/p NSTEMI 02/2009 - PCI LCX with Xience DES. Otherwise branch vessel and Dist. RCA dzs. NL EF.  b. 2013: cath showing patency of LCx stent and mild nonobstructive dz in LAD; normal LV function  . Myocardial infarction ([place])   . CHF (congestive heart failure) ([place])   . Kidney stones   . UTI (lower urinary tract infection)   . Constipation   . Hernia, umbilical   . History of blood transfusion   . Fall from slipping on wet surface Jan. 7, 2014    Right stump bleeding from fall  . Carotid artery occlusion   . Proliferative diabetic retinopathy of right eye ([place])   . Subclavian steal syndrome   . Diabetic neuropathy with neurologic complication ([place])   . History of right below knee amputation ([place])   . Vitamin D deficiency   . Goiter     Past Surgical History  Procedure Laterality Date  . Vesicovaginal fistula closure w/ tah    . Colonoscopy  11/2010  . Upper gastrointestinal endoscopy  11/2010  .  Femoral artery - popliteal artery bypass graft      left  . Abdominal hysterectomy      complete  . Femoral-popliteal bypass graft  08/22/2011    Procedure: BYPASS GRAFT FEMORAL-POPLITEAL ARTERY;  Surgeon: Curt Jews, MD;  Location: [facility];  Service: Vascular;  Laterality: Right;  Thrombectomy and Revision using 62m x 10cm stretch goretex graft  . Intraoperative arteriogram  08/22/2011    Procedure: INTRA OPERATIVE ARTERIOGRAM;  Surgeon: TCurt Jews MD;  [facility];  Service: Vascular;  Laterality: Right;  to lower leg  . Multiple tooth extractions  08-29-2011    5 teeth extracted   . Pci  01/17/12    RLE  . Breast lumpectomy      right  .  Tonsillectomy and adenoidectomy    . Coronary angioplasty with stent placement    . Carotid endarterectomy  ~ 2005    left  . Aorta - bilateral femoral artery bypass graft  03/26/2012    Procedure: AORTA BIFEMORAL BYPASS GRAFT;  Surgeon: TRosetta Posner MD;  [facility];  Service: Vascular;  Laterality: N/A;  Aortic-bifemoral bypass using 14x827mHemashield graft .   . Umbilical hernia repair  03/26/2012    Procedure: HERNIA REPAIR UMBILICAL ADULT;  Surgeon: ToRosetta PosnerMD;  [facility];  Service: Vascular;  Laterality: N/A;  Removal of Umbilical hernia sac  . Femoral artery exploration  03/26/2012    Procedure: FEMORAL ARTERY EXPLORATION;  Surgeon: ToRosetta PosnerMD;  [facility];  Service: Vascular;  Laterality: Right;  with Revision of Popliteal-Peroneal bypass graft using 64m27m 10cm thin wall goretex graft  . Amputation  04/23/2012    Procedure: AMPUTATION BELOW KNEE;  Surgeon: TodRosetta PosnerD;  [facility];  Laterality: Right;  . Eye surgery Right Feb. 9, 2015    Cataract  . Eye surgery Left Feb. 16, 2015    Cataract  . Abdominal aortagram N/A 05/17/2011    Procedure: ABDOMINAL AORTAGRAM;  Surgeon: VanSerafina MitchellD;  [facility];  Service: Cardiovascular;  Laterality: N/A;  . Lower extremity angiogram  05/17/2011    Procedure: LOWER EXTREMITY ANGIOGRAM;  Surgeon: VanSerafina MitchellD;  [facility];  Service: Cardiovascular;;  . Percutaneous stent intervention Right 05/17/2011    Procedure: PERCUTANEOUS STENT INTERVENTION;  Surgeon: VanSerafina MitchellD;  [facility];  Service: Cardiovascular;  Laterality: Right;  . Lower extremity angiogram N/A 09/21/2011    Procedure: LOWER EXTREMITY ANGIOGRAM;  Surgeon: VanSerafina MitchellD;  [facility];  Service: Cardiovascular;  Laterality: N/A;  . Abdominal angiogram  09/21/2011    Procedure: ABDOMINAL ANGIOGRAM;  Surgeon: VanSerafina MitchellD;  [facility];  Service:  Cardiovascular;;  . Abdominal aortagram N/A 01/17/2012    Procedure: ABDOMINAL AORMaxcine HamSurgeon: VanSerafina MitchellD;  [facility];  Service: Cardiovascular;  Laterality: N/A;  . Left heart catheterization with coronary angiogram N/A 04/05/2012    Procedure: LEFT HEART CATHETERIZATION WITH CORONARY ANGIOGRAM;  Surgeon: MicSherren MochaD;  [facility];  Service: Cardiovascular;  Laterality: N/A;  . Peripheral vascular catheterization N/A 06/08/2015    Procedure: Abdominal Aortogram w/Lower Extremity;  Surgeon: ChaElam DutchD;  [facility];  Service: Cardiovascular;  Laterality: N/A;  . Hernia repair    . Femoral-tibial bypass graft Left 08/19/2015    Procedure: LEFT FEMORAL-  ABOVE  KNEE POPLITEAL BYPASS GRAFT (VEIN) to BELOW KNEE POPLITEAL ARTERY BYPASS WITH SMALL SAPHENOUS VEIN GRAFT;  Surgeon: Rosetta Posner, MD;  [facility];  Service: Vascular;  Laterality: Left;    Social History   Social History  . Marital Status: Married    Spouse Name: N/A  . Number of Children: N/A  . Years of Education: N/A   Occupational History  . Not on file.   Social History Main Topics  . Smoking status: Former Smoker -- 2.00 packs/day for 39 years    Types: Cigarettes    Quit date: 07/05/2001  . Smokeless tobacco: Never Used  . Alcohol Use: 9.0 oz/week    1 Shots of liquor, 14 Standard drinks or equivalent per week     Comment: burbon   . Drug Use: No  . Sexual Activity: Not on file   Other Topics Concern  . Not on file   Social History Narrative   Does not get regular exercise    Allergies  Allergen Reactions  . Acetaminophen Nausea Only and Other (See Comments)    Does not tolerate well, nausea  . Ceftriaxone Itching  . Codeine Nausea And Vomiting  . Erythromycin Nausea And Vomiting  . Hydrocodone-Acetaminophen Nausea And Vomiting  . Hydromorphone Nausea And Vomiting  . Propoxyphene Hcl Nausea And Vomiting  . Statins Other (See Comments)      Leg myalgias  . Eggs Or Egg-Derived Products Other (See Comments)    "pt does not eat" Unspecified reaction  . Ibuprofen Other (See Comments)    Unspecified  Per Pt "Does not tolerate well"  . Shellfish-Derived Products Other (See Comments)    "pt does not eat" Unspecified reaction    Family History  Problem Relation Age of Onset  . Heart attack Mother   . Heart attack Father   . Hypertension Brother   . Diabetes Brother   . Hypertension Brother     Prior to Admission medications   Medication Sig Start Date End Date Taking? Authorizing Provider  ALPRAZolam (XANAX) 0.25 MG tablet Take 0.25 mg by mouth 2 (two) times daily.   Yes Historical Provider, MD  aspirin EC 81 MG tablet Take 1 tablet (81 mg total) by mouth daily. 07/10/15  Yes Sherren Mocha, MD  calcium carbonate (TUMS - DOSED IN MG ELEMENTAL CALCIUM) 500 MG chewable tablet Chew 1,000 mg by mouth daily as needed for indigestion or heartburn.   Yes Historical Provider, MD  dexlansoprazole (DEXILANT) 60 MG capsule Take 60 mg by mouth 2 (two) times daily.   Yes Historical Provider, MD  ergocalciferol (VITAMIN D2) 50000 units capsule Take 50,000 Units by mouth 3 (three) times a week.   Yes Historical Provider, MD  ezetimibe (ZETIA) 10 MG tablet TAKE 1 TABLET BY MOUTH EVERY DAY 09/23/15  Yes Sherren Mocha, MD  furosemide (LASIX) 40 MG tablet Take 1 tablet (40 mg total) by mouth daily. 04/09/12  Yes Regina J Roczniak, PA-C  Insulin Degludec (TRESIBA FLEXTOUCH) 100 UNIT/ML SOPN Inject 14 Units into the skin daily.    Yes Historical Provider, MD  iron polysaccharides (NIFEREX) 150 MG capsule Take 450 mg by mouth 2 (two) times daily.    Yes Historical Provider, MD  isosorbide mononitrate (IMDUR) 30 MG 24 hr tablet Take 30 mg by mouth daily.   Yes Historical Provider, MD  Lactobacillus (ACIDOPHILUS PO) Take 1 capsule by mouth daily.   Yes Historical Provider, MD  meperidine (DEMEROL) 50 MG tablet Take 1-2 tablets (50-100 mg  total) by  mouth every 4 (four) hours as needed for severe pain. Patient taking differently: Take 50 mg by mouth. Take one tablets every 4 hours as needed for pain (1-5);  Take two tablets every 4 hours as needed for pain (6-10) 08/21/15  Yes Samantha J Rhyne, PA-C  metoprolol succinate (TOPROL-XL) 50 MG 24 hr tablet Take 1 tablet (50 mg total) by mouth daily. With 15m tablet (for 742mtotal).  Take with or immediately following a meal. Patient taking differently: Take 50 mg by mouth daily.  09/08/15  Yes Tiffany L Reed, DO  nitroGLYCERIN (NITROSTAT) 0.4 MG SL tablet Place 1 tablet (0.4 mg total) under the tongue every 5 (five) minutes as needed. For chest pain 05/16/14  Yes MiSherren MochaMD  omega-3 acid ethyl esters (LOVAZA) 1 G capsule Take 2 g by mouth 2 (two) times daily.    Yes Historical Provider, MD  pioglitazone-metformin (ACTOPLUS MET) 15-850 MG per tablet Take 1 tablet by mouth daily. Brand name only-does not work if components are given separately per patient 15-850 MG   Yes Historical Provider, MD  potassium chloride (K-DUR) 10 MEQ tablet Take 1 tablet (10 mEq total) by mouth 2 (two) times daily. 04/09/12  Yes Regina J Roczniak, PA-C  ramipril (ALTACE) 1.25 MG capsule Take 1.25 mg by mouth daily. BRAND NAME ONLY   Yes Historical Provider, MD  rosuvastatin (CRESTOR) 10 MG tablet Take 10 mg by mouth every Monday.    Yes Historical Provider, MD  sitaGLIPtan (JANUVIA) 100 MG tablet Take 100 mg by mouth at bedtime.    Yes Historical Provider, MD  sodium chloride (OCEAN) 0.65 % SOLN nasal spray Place 2-4 sprays into both nostrils every 4 (four) hours as needed (for nose bleeds).   Yes Historical Provider, MD  sulfamethoxazole-trimethoprim (BACTRIM DS,SEPTRA DS) 800-160 MG tablet Take 1 tablet by mouth at bedtime. 12/03/15  Yes Historical Provider, MD  prochlorperazine (COMPAZINE) 10 MG tablet Take 1 tablet (10 mg total) by mouth every 6 (six) hours as needed. For nausea Patient not taking: Reported on  12/09/2015 07/24/12   ToRosetta PosnerMD    Physical Exam: Filed Vitals:   12/09/15 1230 12/09/15 1300 12/09/15 1400 12/09/15 1415  BP: 99/75 122/56 109/56 104/46  Pulse: 28 85 86 84  Temp:      TempSrc:      Resp: _0 SpO2: 91% 100% 100% 99%     General:  Appears anxious. Lying in bed Eyes:  PERRL, EOMI, normal lids, iris ENT: Very dry mucous membranes, poor dentition Neck:  no LAD, masses or thyromegaly Cardiovascular: Difficult to appreciate heart sounds due to patient being on BiPAP at time of examination. Regular rate and rhythm. Trace left lower extremity edema. Respiratory: Diffuse crackles in bases. Increased respiratory sounds due to BiPAP. Increased effort. Abdomen:  soft, ntnd, NABS Skin: Left hip wound with large complex dressing in place. Foul-smelling. Musculoskeletal: right BKA. Otherwise normal tone and strength and range of motion.  Psychiatric:  grossly normal mood and affect, speech fluent and appropriate, AOx3 Neurologic:  CN 2-12 grossly intact, moves all extremities in coordinated fashion, sensation intact  Labs on Admission: I have personally reviewed following labs and imaging studies  CBC:  Recent Labs Lab 12/09/15 0734  WBC 31.5*  NEUTROABS 29.3*  HGB 9.0*  HCT 28.0*  MCV 80.0  PLT PLATELET CLUMPS NOTED ON SMEAR, COUNT APPEARS ADEQUATE   Basic Metabolic Panel:  Recent Labs Lab 12/09/15 0734  NA 136  K 6.6*  CL 104  CO2 19*  GLUCOSE 151*  BUN 15  CREATININE 1.14*  CALCIUM 8.6*   GFR: CrCl cannot be calculated (Unknown ideal weight.). Liver Function Tests:  Recent Labs Lab 12/09/15 0734  AST 45*  ALT 19  ALKPHOS 94  BILITOT 1.4*  PROT 6.2*  ALBUMIN 2.5*   No results for input(s): LIPASE, AMYLASE in the last 168 hours. No results for input(s): AMMONIA in the last 168 hours. Coagulation Profile: No results for input(s): INR, PROTIME in the last 168 hours. Cardiac Enzymes:  Recent Labs Lab 12/09/15 0734  TROPONINI  0.07*   BNP (last 3 results) No results for input(s): PROBNP in the last 8760 hours. HbA1C: No results for input(s): HGBA1C in the last 72 hours. CBG:  Recent Labs Lab 12/04/15 1132 12/07/15 0915 12/07/15 1124 12/08/15 0932 12/08/15 1145  GLUCAP 166* 219* 147* 146* 131*   Lipid Profile: No results for input(s): CHOL, HDL, LDLCALC, TRIG, CHOLHDL, LDLDIRECT in the last 72 hours. Thyroid Function Tests: No results for input(s): TSH, T4TOTAL, FREET4, T3FREE, THYROIDAB in the last 72 hours. Anemia Panel: No results for input(s): VITAMINB12, FOLATE, FERRITIN, TIBC, IRON, RETICCTPCT in the last 72 hours. Urine analysis:    Component Value Date/Time   COLORURINE YELLOW 08/18/2015 1838   APPEARANCEUR TURBID* 08/18/2015 1838   LABSPEC 1.011 08/18/2015 1838   PHURINE 5.0 08/18/2015 1838   GLUCOSEU NEGATIVE 08/18/2015 1838   HGBUR MODERATE* 08/18/2015 1838   BILIRUBINUR NEGATIVE 08/18/2015 1838   KETONESUR NEGATIVE 08/18/2015 1838   PROTEINUR NEGATIVE 08/18/2015 1838   UROBILINOGEN 4.0* 08/19/2014 0021   NITRITE NEGATIVE 08/18/2015 1838   LEUKOCYTESUR LARGE* 08/18/2015 1838    Creatinine Clearance: CrCl cannot be calculated (Unknown ideal weight.).  Sepsis Labs: _0 (procalcitonin:4,lacticidven:4) )No results found for this or any previous visit (from the past 240 hour(s)).   Radiological Exams on Admission: Dg Chest Portable 1 View  12/09/2015  CLINICAL DATA:  Shortness of breath since 5 a.m.; history of CHF and coronary artery disease with angioplasty and stent placement, peripheral vascular disease, diabetes ; former heavy smoker. EXAM: PORTABLE CHEST 1 VIEW COMPARISON:  Portable chest x-ray of June 05, 2015 FINDINGS: The lungs remain hyperinflated. The interstitial markings are increased diffusely. The cardiac silhouette is mildly enlarged. The central pulmonary vascularity is prominent. There is no pleural effusion or pneumothorax. The trachea is midline. IMPRESSION:  COPD with superimposed CHF. Atelectasis or developing pneumonia in the lower lobes bilaterally is present. There is no pleural effusion or pneumothorax. Electronically Signed   By: David  Martinique M.D.   On: 12/09/2015 07:36      Assessment/Plan Active Problems:   CHF (congestive heart failure) (HCC)   Acute on chronic diastolic (congestive) heart failure (HCC)   Sepsis (HCC)   HCAP (healthcare-associated pneumonia)   Elevated troponin   Dry gangrene (HCC)   Diabetes mellitus with complication (HCC)   GERD without esophagitis   Essential hypertension   AKI (acute kidney injury) (HCC)   Anxiety   Chronic pain   HLD (hyperlipidemia)    Acute respiratory failure/Sepsis: Likely secondary to combination of underlying COPD, CHF exacerbation and HCAP. Pt refusing certain labs so unable to obtain BNP. VBG after being on BIPAP for 4 hrs show pH 7.36, PCO2 43.1, PO2 31, bicarbonate 24.6, lactic acid 2.34, WBC 31.5, tachypnea. Patient refusing further BiPAP at this time. Last echo showing EF 65% adn grade 1 diastolic dysfunction. Patient is DO NOT INTUBATE - Stepdown -  Vanc/Aztreonam - Repeat Echo - IV Lasix 40 mg twice a day (of note pt also getting small volume IVF - NS50cc/hr, given sepsis and initial hypotension and tachycardia). - Strict I's and O's, daily weights - Continue beta blocker - O2 when necessary and when patient will allow - CXR in am  Elevated trop: 0.07 on admission. EKG w/o sign of ACS. Started on Nitro drip initially. Cards consulted by EDP - Dr Irish Lack. Likely due to demand from CHF and CAP w/ h/o underlying CAD.  - cycle trop - EKG in am - Tele - Echo - continue ASA, statin, bblocker, Imdur  L Hip Chronic wound: Wound [facility]. Sees Dr. Asa Saunas. Goes for hyperbaric chamber treatments 5x wkly. Next scheduled dressing change on Friday.  - DO NOT MAKE ANY CHANGES TO CURRENT WOUND/DRESSINGS - f/u at wound clinic on Friday with Dr. Asa Saunas - for further questions  please call wound clinic at [phone]. - Air overlay  L 2 toe dry gangrene: followed by wound clinic - Dr. Asa Saunas. No further mgt at this time. Stable and at baseline.  - f/u outpt  Severe PVD: followed by Dr Donnetta Hutching. L Fem-pop in 2009. Currently w/ poor healing L hip wound as above and 2 toes on L w/ dry gangrene. Distal limb warm w/ <2sec cap refill - continue outpt f/u and magt - inpt mgt as above.   AKI: mild. Cr 1.14 on admission. Baseline 0.9. Diuresis as above likely to worsen function initially.  - BMET in am - Hold lisinopril until am  DM:on ultralongacting basal degludec and orals - hold Actos and metformin - continue Degludec 14 units daily  HyperK: 6.6 on admission. On Kdur outpt. Suspect hemolysis. Pt refusing lab redraw initially so unable to confirm. EKG unremarkable - hold home [place] - IVF - recheck when pt allows.  - Pt refusing additional reversal medications.   HTN: pressure responded well to IVF - Resume metop, Imdur in am  HLD: - continue crestor  GERD: - continue ppi  Depression/Anxiety: - continue Xanax  Chronic pain: - continue Demerol  DVT prophylaxis: Lovenox  Code Status: DNI  Family Communication: husband and daughter  Disposition Plan: pending improvement in condition   Consults called: cards  Admission status:  Inpatient, stepdown    MERRELL, DAVID J MD [facility]  If 7PM-7AM, please contact night-coverage [email or web address] Password [email or web address]  12/09/2015, 2:48 PM

## 2015-12-09 NOTE — ED Notes (Signed)
Pt is requesting water. I informed the patient that with the with the bi-pap mask she must stay NPO at this time.

## 2015-12-09 NOTE — ED Notes (Signed)
Pt reports feeling short of breath. Placed on 3L of O2 via nasal cannula which she reports makes her feel better.

## 2015-12-09 NOTE — Progress Notes (Signed)
Pt refused ABG scheduled for 1700 hrs today.  Pt was made aware of the importance of the draw.  Pt is not in distress at this time, not on Bi-PAP, is alert and oriented.  Will continue to monitor and pass status info to night shift RT.

## 2015-12-09 NOTE — Progress Notes (Addendum)
Paged by pts nurse to evaluate patient due to acute worsening of respiratory status. On arrival patient was back in BiPAP and in obvious distress. Patient with labored breathing diaphoretic and anxious appearing. Stat ABG was ordered as well as stat chest x-ray. Family initially refused ABG. After significant discussion allowed respiratory therapy to attempt ABG. 2 attempts were made but they were unsuccessful in obtaining ABG. Only refused all further attempts at ABG. Of note over the course of the last several hours family has refused all further blood draws, thus hampering our abilities to further assess her medical condition and alter treatment plan as necessary. Multiple tests were made to have a PICC line placed both through the hospital PICC team as well as through interventional radiology. This was unable to be obtained due to complexity of patient's situation and availability and family's refusal to have the apical staff attempted line placement. Chest x-ray on my reading worrisome for progressive vascular pulmonary congestion and CHF with possible ARDS/flash pulmonary edema. Patient received additional 40 mg IV dose of Lasix. At the crux of patient's care has been both a refusal of diagnostic workup as well as initial request to be DO NOT INTUBATE. Extensive discussions were had with family including patient's husband, patient's son, and family's medical attorney concerning what this decision entails. After further discussions patient changed her status to allow for intubation. Throughout this time critical care medicine had been consult at to assist in patient care. His family has now decided for full medical treatment including intubation critical care medicine has willingly accepted the patient for central line placement and intubation.  Patient's caregiver expeditiously was transferred to critical care medicine  Shelly Flatten, MD [facility] 12/09/2015, 7:58 PM

## 2015-12-09 NOTE — Progress Notes (Signed)
Patient and pt husband refusing all lab draws. Explained at length about importance of blood cultures, but not receptive to reasoning. Paged Dr. Konrad Dolores to inform him of this. Will plan to get one blood culture with initiation of PICC line placement and proceed to draw the rest of her labs at that time. Family continues to refuse treatment including ABG blood draw and assessment of Left Thigh Leg wound. Patient has known Left Thigh Skin graft. Husband claims they were not to touch for 2 weeks. Wound is obviously malodorous with drainage. Patient and husband state they only want wound care to look at wound. Also found patient to have necrotic appearing toes on Left foot. Wound care consult placed.  Noe Gens, RN

## 2015-12-09 NOTE — Progress Notes (Signed)
Pharmacy Antibiotic Note  [name] is a 70 y.o. female admitted on 12/09/2015 with pneumonia.  Pharmacy has been consulted for vancomycin and aztreonam dosing. Patient also received one dose of Levaquin 750mg  in the ED. SCr 1.14, estimated CrCl ~40.  Plan: Vancomycin 1000 IV every 24 hours.  Goal trough 15-20 mcg/mL. Aztreonam 2 gram every 8 hours  Follow renal function and culture results for antibiotic adjustments Vancomycin tough when at steady state  Weight: 139 lb 15.9 oz (63.5 kg)  Temp (24hrs), Avg:97.1 F (36.2 C), Min:97.1 F (36.2 C), Max:97.1 F (36.2 C)   Recent Labs Lab 12/09/15 0734 12/09/15 0736  WBC 31.5*  --   CREATININE 1.14*  --   LATICACIDVEN  --  2.34*    Estimated Creatinine Clearance: 39.7 mL/min (by C-G formula based on Cr of 1.14).    Allergies  Allergen Reactions  . Acetaminophen Nausea Only and Other (See Comments)    Does not tolerate well, nausea  . Ceftriaxone Itching  . Codeine Nausea And Vomiting  . Erythromycin Nausea And Vomiting  . Hydrocodone-Acetaminophen Nausea And Vomiting  . Hydromorphone Nausea And Vomiting  . Propoxyphene Hcl Nausea And Vomiting  . Statins Other (See Comments)    Leg myalgias  . Eggs Or Egg-Derived Products Other (See Comments)    "pt does not eat" Unspecified reaction  . Ibuprofen Other (See Comments)    Unspecified  Per Pt "Does not tolerate well"  . Shellfish-Derived Products Other (See Comments)    "pt does not eat" Unspecified reaction    Antimicrobials this admission: 6/7 levaquin*1 dose in the ED 6/7 vanc >>  6/7 aztreonam >>   Dose adjustments this admission: NA  Microbiology results: NA  Thank you for allowing pharmacy to be a part of this patient's care.  Sherron Monday, PharmD Clinical Pharmacy Resident Pager: [phone] 12/09/2015 3:22 PM

## 2015-12-09 NOTE — ED Notes (Signed)
Pt refused to have blood draw for repeat CMP as well as IV start by IV team.

## 2015-12-09 NOTE — ED Notes (Signed)
Pt arrives via EMS with CPAP - found by husband "unresponsive", guppy breathing. Accessory muscle use, retractions, pale, diaphoretic. LS wet. No IV access. EKG unremarkable, no pain,

## 2015-12-09 NOTE — Consult Note (Signed)
Cardiology Consult    Patient ID: [name] MRN: [id number], DOB/AGE: [birth year]   Admit date: 12/09/2015 Date of Consult: 12/09/2015  Primary Physician: Julian Hy, MD Reason for Consult: Acute Respiratory Failure Primary Cardiologist: Dr. Excell Seltzer Requesting Provider: Dr. Wilkie Aye    History of Present Illness    [name] is a 70 y.o. female with past medical history of CAD (s/p PCI of LCx in 2010, cath in 2013 showing patency of LCx stent and mild nonobstructive CAD in LAD and normal LV function), severe PAD (s/p R BKA in 2013, L fem-pop bypass in 2009, aortobifemoral bypass in 2013, recent left fem-pop above knee to below-knee popliteal bypass on 08/19/2015), carotid artery disease (s/p L carotid endarterectomy in 2005), HTN, Type 2 DM, HLD, and non-healing wounds (currently undergoing hyperbaric treatment) who presents to [facility] ED on 12/09/2015 for acute shortness of breath.  Most history is obtained from the patient's husband and two children at the bedside, as she is currently on BiPAP.   Patent's husband reports that at 0500 this morning, he could hear her gasping for air from a different room in the house. She was in the recliner and upon him going to check on her, he noted she had decreased responsiveness and was gasping for air with noted accessory muscle use. EMS was called at that time. She denies any associated chest pain or palpitations at that time. Reports having lower extremity edema and orthopnea at baseline, with no acute worsening.  Patient's husband reports she is followed by the wound center for a non-healing wound on her left upper thigh and is currently undergoing hyperbaric treatment. Is currently on Levofloxacin as an outpatient.  While in the ED, her K+ was noted to be 6.6 (Sample hemolyzed), creatinine 1.14. CBC shows WBC of 31.5 and Hgb 9.0. Troponin 0.07. Lactic Acid 2.34. CXR shows COPD with superimposed CHF along with atelectasis or  developing pneumonia in the lower lobes bilaterally. EKG shows NSR, HR 87 with PAC's, with no acute ST or T-wave changes.   Past Medical History   Past Medical History  Diagnosis Date  . Hypertension     Unspecified  . Hyperlipidemia     Mixed  . Tobacco abuse     Remote  . Loose, teeth     has loose bridge and two loose teeth holding it  . PONV (postoperative nausea and vomiting)   . Type II diabetes mellitus (HCC)   . GERD (gastroesophageal reflux disease)   . Anxiety   . Peripheral vascular disease (HCC)     unspecified, a. s/p L CEA b. s/p B fem-pop bypass  . Raynaud disease   . Stones in the urinary tract   . UTI (urinary tract infection)   . Anemia     hx  . Coronary artery disease     a. s/p NSTEMI 02/2009 - PCI LCX with Xience DES. Otherwise branch vessel and Dist. RCA dzs. NL EF.   Marland Kitchen Myocardial infarction (HCC)   . CHF (congestive heart failure) (HCC)   . Kidney stones   . UTI (lower urinary tract infection)   . Constipation   . Hernia, umbilical   . History of blood transfusion   . Fall from slipping on wet surface Jan. 7, 2014    Right stump bleeding from fall  . Carotid artery occlusion   . Proliferative diabetic retinopathy of right eye (HCC)   . Subclavian steal syndrome   . Diabetic neuropathy with neurologic complication (  HCC)   . History of right below knee amputation (HCC)   . Vitamin D deficiency   . Goiter     Past Surgical History  Procedure Laterality Date  . Vesicovaginal fistula closure w/ tah    . Colonoscopy  11/2010  . Upper gastrointestinal endoscopy  11/2010  . Femoral artery - popliteal artery bypass graft      left  . Abdominal hysterectomy      complete  . Femoral-popliteal bypass graft  08/22/2011    Procedure: BYPASS GRAFT FEMORAL-POPLITEAL ARTERY;  Surgeon: Gretta Began, MD;  Location: [facility] OR;  Service: Vascular;  Laterality: Right;  Thrombectomy and Revision using 7mm x 10cm stretch goretex graft  . Intraoperative arteriogram   08/22/2011    Procedure: INTRA OPERATIVE ARTERIOGRAM;  Surgeon: Gretta Began, MD;  Location: [facility] OR;  Service: Vascular;  Laterality: Right;  to lower leg  . Multiple tooth extractions  08-29-2011    5 teeth extracted   . Pci  01/17/12    RLE  . Breast lumpectomy      right  . Tonsillectomy and adenoidectomy    . Coronary angioplasty with stent placement    . Carotid endarterectomy  ~ 2005    left  . Aorta - bilateral femoral artery bypass graft  03/26/2012    Procedure: AORTA BIFEMORAL BYPASS GRAFT;  Surgeon: Larina Earthly, MD;  Location: [facility] OR;  Service: Vascular;  Laterality: N/A;  Aortic-bifemoral bypass using 14x51mm Hemashield graft .   . Umbilical hernia repair  03/26/2012    Procedure: HERNIA REPAIR UMBILICAL ADULT;  Surgeon: Larina Earthly, MD;  Location: [facility] OR;  Service: Vascular;  Laterality: N/A;  Removal of Umbilical hernia sac  . Femoral artery exploration  03/26/2012    Procedure: FEMORAL ARTERY EXPLORATION;  Surgeon: Larina Earthly, MD;  Location: [facility] OR;  Service: Vascular;  Laterality: Right;  with Revision of Popliteal-Peroneal bypass graft using 6mm x 10cm thin wall goretex graft  . Amputation  04/23/2012    Procedure: AMPUTATION BELOW KNEE;  Surgeon: Larina Earthly, MD;  Location: [facility] OR;  Service: Vascular;  Laterality: Right;  . Eye surgery Right Feb. 9, 2015    Cataract  . Eye surgery Left Feb. 16, 2015    Cataract  . Abdominal aortagram N/A 05/17/2011    Procedure: ABDOMINAL AORTAGRAM;  Surgeon: Nada Libman, MD;  Location: [facility] CATH LAB;  Service: Cardiovascular;  Laterality: N/A;  . Lower extremity angiogram  05/17/2011    Procedure: LOWER EXTREMITY ANGIOGRAM;  Surgeon: Nada Libman, MD;  Location: [facility] CATH LAB;  Service: Cardiovascular;;  . Percutaneous stent intervention Right 05/17/2011    Procedure: PERCUTANEOUS STENT INTERVENTION;  Surgeon: Nada Libman, MD;  Location: [facility] CATH LAB;  Service: Cardiovascular;  Laterality: Right;  . Lower extremity angiogram N/A  09/21/2011    Procedure: LOWER EXTREMITY ANGIOGRAM;  Surgeon: Nada Libman, MD;  Location: [facility] CATH LAB;  Service: Cardiovascular;  Laterality: N/A;  . Abdominal angiogram  09/21/2011    Procedure: ABDOMINAL ANGIOGRAM;  Surgeon: Nada Libman, MD;  Location: [facility] CATH LAB;  Service: Cardiovascular;;  . Abdominal aortagram N/A 01/17/2012    Procedure: ABDOMINAL Ronny Flurry;  Surgeon: Nada Libman, MD;  Location: [facility] CATH LAB;  Service: Cardiovascular;  Laterality: N/A;  . Left heart catheterization with coronary angiogram N/A 04/05/2012    Procedure: LEFT HEART CATHETERIZATION WITH CORONARY ANGIOGRAM;  Surgeon: Tonny Bollman, MD;  Location: [facility] CATH LAB;  Service: Cardiovascular;  Laterality:  N/A;  . Peripheral vascular catheterization N/A 06/08/2015    Procedure: Abdominal Aortogram w/Lower Extremity;  Surgeon: Sherren Kerns, MD;  Location: [facility] INVASIVE CV LAB;  Service: Cardiovascular;  Laterality: N/A;  . Hernia repair    . Femoral-tibial bypass graft Left 08/19/2015    Procedure: LEFT FEMORAL-  ABOVE KNEE POPLITEAL BYPASS GRAFT (VEIN) to BELOW KNEE POPLITEAL ARTERY BYPASS WITH SMALL SAPHENOUS VEIN GRAFT;  Surgeon: Larina Earthly, MD;  Location: [facility] OR;  Service: Vascular;  Laterality: Left;     Allergies  Allergies  Allergen Reactions  . Acetaminophen Nausea Only and Other (See Comments)    Does not tolerate well, nausea  . Ceftriaxone Itching  . Codeine Nausea And Vomiting  . Erythromycin Nausea And Vomiting  . Hydrocodone-Acetaminophen Nausea And Vomiting  . Hydromorphone Nausea And Vomiting  . Propoxyphene Hcl Nausea And Vomiting  . Statins Other (See Comments)    Leg myalgias  . Eggs Or Egg-Derived Products Other (See Comments)    "pt does not eat" Unspecified reaction  . Ibuprofen Other (See Comments)    Unspecified  Per Pt "Does not tolerate well"  . Shellfish-Derived Products Other (See Comments)    "pt does not eat" Unspecified reaction    Inpatient Medications    .  furosemide  40 mg Intravenous Once    Family History    Family History  Problem Relation Age of Onset  . Heart attack Mother   . Heart attack Father   . Hypertension Brother   . Diabetes Brother   . Hypertension Brother     Social History    Social History   Social History  . Marital Status: Married    Spouse Name: N/A  . Number of Children: N/A  . Years of Education: N/A   Occupational History  . Not on file.   Social History Main Topics  . Smoking status: Former Smoker -- 2.00 packs/day for 39 years    Types: Cigarettes    Quit date: 07/05/2001  . Smokeless tobacco: Never Used  . Alcohol Use: 9.0 oz/week    1 Shots of liquor, 14 Standard drinks or equivalent per week     Comment: burbon   . Drug Use: No  . Sexual Activity: Not on file   Other Topics Concern  . Not on file   Social History Narrative   Does not get regular exercise     Review of Systems    General:  No chills, fever, night sweats or weight changes.  Cardiovascular:  No chest pain, dyspnea on exertion, palpitations, paroxysmal nocturnal dyspnea. Positive for edema and orthopnea. Dermatological: No rash, lesions/masses Respiratory: No cough, Positive for dyspnea. Urologic: No hematuria, dysuria Abdominal:   No nausea, vomiting, diarrhea, bright red blood per rectum, melena, or hematemesis Neurologic:  No visual changes, wkns, changes in mental status. All other systems reviewed and are otherwise negative except as noted above.  Physical Exam    Blood pressure 101/50, pulse 76, temperature 97.1 F (36.2 C), temperature source Axillary, resp. rate 22, SpO2 100 %.  General: Pleasant, Caucasian female, currently on BiPAP. Removed the mask and able to talk in full sentences. Psych: Normal affect. Neuro: Alert and oriented X 3. Moves all extremities spontaneously. HEENT: Normal  Neck: Supple without bruits. JVD elevated to 9cm. Lungs:  Resp regular and unlabored, rales at bases  bilaterally. Heart: RRR no s3, s4, or murmurs. Abdomen: Soft, non-tender, non-distended, BS + x 4.  Extremities: No clubbing or cyanosis.  Trace edema on the left. DP/PT/Radials 1+ on the left. BKA on right.  Labs    Troponin (Point of Care Test) No results for input(s): TROPIPOC in the last 72 hours.  Recent Labs  12/09/15 0734  TROPONINI 0.07*   Lab Results  Component Value Date   WBC 31.5* 12/09/2015   HGB 9.0* 12/09/2015   HCT 28.0* 12/09/2015   MCV 80.0 12/09/2015   PLT  12/09/2015    PLATELET CLUMPS NOTED ON SMEAR, COUNT APPEARS ADEQUATE    Recent Labs Lab 12/09/15 0734  NA 136  K 6.6*  CL 104  CO2 19*  BUN 15  CREATININE 1.14*  CALCIUM 8.6*  PROT 6.2*  BILITOT 1.4*  ALKPHOS 94  ALT 19  AST 45*  GLUCOSE 151*   Lab Results  Component Value Date   CHOL 163 07/01/2014   HDL 57.70 07/01/2014   LDLCALC 76 07/01/2014   TRIG 148.0 07/01/2014   No results found for: [facility]   Radiology Studies    Dg Chest Portable 1 View: 12/09/2015  CLINICAL DATA:  Shortness of breath since 5 a.m.; history of CHF and coronary artery disease with angioplasty and stent placement, peripheral vascular disease, diabetes ; former heavy smoker. EXAM: PORTABLE CHEST 1 VIEW COMPARISON:  Portable chest x-ray of June 05, 2015 FINDINGS: The lungs remain hyperinflated. The interstitial markings are increased diffusely. The cardiac silhouette is mildly enlarged. The central pulmonary vascularity is prominent. There is no pleural effusion or pneumothorax. The trachea is midline. IMPRESSION: COPD with superimposed CHF. Atelectasis or developing pneumonia in the lower lobes bilaterally is present. There is no pleural effusion or pneumothorax. Electronically Signed   By: David  [place] M.D.   On: 12/09/2015 07:36    EKG & Cardiac Imaging    EKG:  NSR, HR 87 with PAC's, with no acute ST or T-wave changes  Echocardiogram: Pending  Assessment & Plan    1. Acute Respiratory Failure - noted to  have acute dyspnea this AM, gasping for air with accessory muscle use noted. Denies any chest pain, dyspnea on exertion, or palpitations. Currently stating in the upper 90's on BiPAP. - likely multifactorial in the setting of acute CHF and likely PNA. WBC elevated to 31.5. Lactic Acid 2.34. CXR shows COPD with superimposed CHF along with atelectasis or developing pneumonia in the lower lobes bilaterally. - will start on Lasix 40mg  IV BID. Monitor potassium and creatinine closely with daily BMET. Daily weights and strict I&O's. - will order echo to assess LV function and further define type of CHF.  2. Elevated Troponin/ History of CAD - s/p PCI of LCx in 2010, cath in 2013 showing patency of LCx stent and mild nonobstructive CAD in LAD and normal LV function. - initial troponin 0.07. Will cycle values. EKG shows no acute ischemic changes - will check echo to assess LV function and wall motion. - continue ASA, statin, Zetia, BB, and Imdur.  3. Severe PAD  - s/p R BKA in 2013, L fem-pop bypass in 2009, aortobifemoral bypass in 2013, recent left fem-pop above knee to below-knee popliteal bypass on 08/19/2015 - has non-healing wounds on the left upper thigh. Will likely need Wound Management consult while admitted. - continue ASA and statin.  4. HLD - continue statin therapy and Zetia.  5. Hyperkalemia - K+ 6.6 (sample noted to be hemolyzed).  - repeat lab draw pending.   Signed, Ellsworth Lennox, PA-C 12/09/2015, 10:54 AM Pager: [phone]   I have examined the  patient and reviewed assessment and plan and discussed with patient.  Agree with above as stated.  Markedly elevated WBC.  Possible PNA on xray.  Rx with antibiotics.  Dose of Lasix given for possible heart failure as well.    Lance Muss

## 2015-12-09 NOTE — ED Notes (Signed)
Pt refused lab work.

## 2015-12-09 NOTE — Procedures (Signed)
Central Venous Catheter Insertion Procedure Note [name] [id number] 09-09-1945  Procedure: Insertion of Central Venous Catheter Indications: Assessment of intravascular volume, Drug and/or fluid administration and Frequent blood sampling  Procedure Details Consent: Risks of procedure as well as the alternatives and risks of each were explained to the (patient/caregiver).  Consent for procedure obtained. Time Out: Verified patient identification, verified procedure, site/side was marked, verified correct patient position, special equipment/implants available, medications/allergies/relevent history reviewed, required imaging and test results available.  Performed  Maximum sterile technique was used including antiseptics, cap, gloves, gown, hand hygiene, mask and sheet. Skin prep: Chlorhexidine; local anesthetic administered A antimicrobial bonded/coated triple lumen catheter was placed in the left internal jugular vein using the Seldinger technique.  The needle was initially inserted into the left subclavian vein without difficulty but the wire would not pass.  Ultrasound was used to verify the patency of the vein and for real time needle guidance.  Evaluation Blood flow good Complications: No apparent complications Patient did tolerate procedure well. Chest X-ray ordered to verify placement.  CXR: pending.  Max Fickle 12/09/2015, 8:37 PM

## 2015-12-09 NOTE — ED Provider Notes (Signed)
CSN: [id number]     Arrival date & time 12/09/15  4656 History   First MD Initiated Contact with Patient 12/09/15 [phone]     Chief Complaint  Patient presents with  . Shortness of Breath     (Consider location/radiation/quality/duration/timing/severity/associated sxs/prior Treatment) HPI Patient presents to the emergency department with shortness of breath.  The patient was found by her husband in her recliner this morning with labored breathing.  EMS and fire were called.  Upon arrival they found the patient be to get neck and somewhat lethargic and diaphoretic.  The patient states that she woke up around 5 AM, having trouble breathing.  The patient states that she does not have any pain in her chest or back or abdomen.The patient denies chest pain, headache,blurred vision, neck pain, fever, cough, weakness, numbness, dizziness, anorexia,  abdominal pain, nausea, vomiting, diarrhea, rash, back pain, dysuria, hematemesis, bloody stool, near syncope, or syncope. Past Medical History  Diagnosis Date  . Hypertension     Unspecified  . Hyperlipidemia     Mixed  . Tobacco abuse     Remote  . Loose, teeth     has loose bridge and two loose teeth holding it  . PONV (postoperative nausea and vomiting)   . Type II diabetes mellitus ([place])   . GERD (gastroesophageal reflux disease)   . Anxiety   . Peripheral vascular disease (HCC)     unspecified, a. s/p L CEA b. s/p B fem-pop bypass  . Raynaud disease   . Stones in the urinary tract   . UTI (urinary tract infection)   . Anemia     hx  . Coronary artery disease     a. s/p NSTEMI 02/2009 - PCI LCX with Xience DES. Otherwise branch vessel and Dist. RCA dzs. NL EF.   Marland Kitchen Myocardial infarction ([place])   . CHF (congestive heart failure) ([place])   . Kidney stones   . UTI (lower urinary tract infection)   . Constipation   . Hernia, umbilical   . History of blood transfusion   . Fall from slipping on wet surface Jan. 7, 2014    Right stump bleeding  from fall  . Carotid artery occlusion   . Proliferative diabetic retinopathy of right eye ([place])   . Subclavian steal syndrome   . Diabetic neuropathy with neurologic complication ([place])   . History of right below knee amputation ([place])   . Vitamin D deficiency   . Goiter    Past Surgical History  Procedure Laterality Date  . Vesicovaginal fistula closure w/ tah    . Colonoscopy  11/2010  . Upper gastrointestinal endoscopy  11/2010  . Femoral artery - popliteal artery bypass graft      left  . Abdominal hysterectomy      complete  . Femoral-popliteal bypass graft  08/22/2011    Procedure: BYPASS GRAFT FEMORAL-POPLITEAL ARTERY;  Surgeon: Curt Jews, MD;  Location: [place];  Service: Vascular;  Laterality: Right;  Thrombectomy and Revision using 97m x 10cm stretch goretex graft  . Intraoperative arteriogram  08/22/2011    Procedure: INTRA OPERATIVE ARTERIOGRAM;  Surgeon: TCurt Jews MD;  [facility];  Service: Vascular;  Laterality: Right;  to lower leg  . Multiple tooth extractions  08-29-2011    5 teeth extracted   . Pci  01/17/12    RLE  . Breast lumpectomy      right  . Tonsillectomy and adenoidectomy    . Coronary angioplasty with stent placement    .  Carotid endarterectomy  ~ 2005    left  . Aorta - bilateral femoral artery bypass graft  03/26/2012    Procedure: AORTA BIFEMORAL BYPASS GRAFT;  Surgeon: Rosetta Posner, MD;  Location: [facility] OR;  Service: Vascular;  Laterality: N/A;  Aortic-bifemoral bypass using 14x87m Hemashield graft .   . Umbilical hernia repair  03/26/2012    Procedure: HERNIA REPAIR UMBILICAL ADULT;  Surgeon: TRosetta Posner MD;  [facility];  Service: Vascular;  Laterality: N/A;  Removal of Umbilical hernia sac  . Femoral artery exploration  03/26/2012    Procedure: FEMORAL ARTERY EXPLORATION;  Surgeon: TRosetta Posner MD;  [facility];  Service: Vascular;  Laterality: Right;  with Revision of Popliteal-Peroneal bypass graft using 643mx 10cm thin wall goretex graft   . Amputation  04/23/2012    Procedure: AMPUTATION BELOW KNEE;  Surgeon: ToRosetta PosnerMD;  [place] [facility] Service: [facility];  Laterality: Right;  . Eye surgery Right Feb. 9, 2015    Cataract  . Eye surgery Left Feb. 16, 2015    Cataract  . Abdominal aortagram N/A 05/17/2011    Procedure: ABDOMINAL AORTAGRAM;  Surgeon: VaSerafina MitchellMD;  [facility];  Service: Cardiovascular;  Laterality: N/A;  . Lower extremity angiogram  05/17/2011    Procedure: LOWER EXTREMITY ANGIOGRAM;  Surgeon: VaSerafina MitchellMD;  [facility];  Service: Cardiovascular;;  . Percutaneous stent intervention Right 05/17/2011    Procedure: PERCUTANEOUS STENT INTERVENTION;  Surgeon: VaSerafina MitchellMD;  [facility];  Service: Cardiovascular;  Laterality: Right;  . Lower extremity angiogram N/A 09/21/2011    Procedure: LOWER EXTREMITY ANGIOGRAM;  Surgeon: VaSerafina MitchellMD;  [facility];  Service: Cardiovascular;  Laterality: N/A;  . Abdominal angiogram  09/21/2011    Procedure: ABDOMINAL ANGIOGRAM;  Surgeon: VaSerafina MitchellMD;  [facility];  Service: Cardiovascular;;  . Abdominal aortagram N/A 01/17/2012    Procedure: ABDOMINAL AOMaxcine Ham Surgeon: VaSerafina MitchellMD;  [facility];  Service: Cardiovascular;  Laterality: N/A;  . Left heart catheterization with coronary angiogram N/A 04/05/2012    Procedure: LEFT HEART CATHETERIZATION WITH CORONARY ANGIOGRAM;  Surgeon: MiSherren MochaMD;  [facility];  Service: Cardiovascular;  Laterality: N/A;  . Peripheral vascular catheterization N/A 06/08/2015    Procedure: Abdominal Aortogram w/Lower Extremity;  Surgeon: ChElam DutchMD;  [facility];  Service: Cardiovascular;  Laterality: N/A;  . Hernia repair    . Femoral-tibial bypass graft Left 08/19/2015    Procedure: LEFT FEMORAL-  ABOVE KNEE POPLITEAL BYPASS GRAFT (VEIN) to [place] Surgeon: ToRosetta PosnerMD;  [facility];  Service: Vascular;  Laterality: Left;   Family History  Problem Relation Age of Onset  . Heart attack Mother   . Heart attack Father   . Hypertension Brother   . Diabetes Brother   . Hypertension Brother    Social History  Substance Use Topics  . Smoking status: Former Smoker -- 2.00 packs/day for 39 years    Types: Cigarettes    Quit date: 07/05/2001  . Smokeless tobacco: Never Used  . Alcohol Use: 9.0 oz/week    1 Shots of liquor, 14 Standard drinks or equivalent per week     Comment: burbon    OB History    No data available  Review of Systems  Level V caveat applies due to significant illness and being on BiPAP  Allergies  Acetaminophen; Ceftriaxone; Codeine; Erythromycin; Hydrocodone-acetaminophen; Hydromorphone; Propoxyphene hcl; Statins; Eggs or egg-derived products; Ibuprofen; and Shellfish-derived products  Home Medications   Prior to Admission medications   Medication Sig Start Date End Date Taking? Authorizing Provider  ALPRAZolam (XANAX) 0.25 MG tablet Take 0.25 mg by mouth 2 (two) times daily.    Historical Provider, MD  aspirin EC 81 MG tablet Take 1 tablet (81 mg total) by mouth daily. 07/10/15   Sherren Mocha, MD  Biotin 5 MG/ML LIQD Take by mouth. 15 cc at bedtime swish and spit    Historical Provider, MD  calcium carbonate (TUMS - DOSED IN MG ELEMENTAL CALCIUM) 500 MG chewable tablet Chew 1,000 mg by mouth daily as needed for indigestion or heartburn.    Historical Provider, MD  ciprofloxacin (CIPRO) 500 MG tablet TK 1 T PO BID FOR 10 DAYS 10/29/15   Historical Provider, MD  collagenase (SANTYL) ointment Apply 1 application topically daily. 06/23/14   Historical Provider, MD  dexlansoprazole (DEXILANT) 60 MG capsule Take 60 mg by mouth 2 (two) times daily.    Historical Provider, MD  ergocalciferol (VITAMIN D2) 50000 units capsule Take 50,000 Units by mouth 3 (three) times a week.    Historical  Provider, MD  ezetimibe (ZETIA) 10 MG tablet TAKE 1 TABLET BY MOUTH EVERY DAY 09/23/15   Sherren Mocha, MD  furosemide (LASIX) 40 MG tablet Take 1 tablet (40 mg total) by mouth daily. 04/09/12   Regina J Roczniak, PA-C  Insulin Degludec (TRESIBA FLEXTOUCH) 100 UNIT/ML SOPN Inject 16 Units into the skin daily.    Historical Provider, MD  iron polysaccharides (NIFEREX) 150 MG capsule Take three (3) capsules (450 mg total) by mouth every morning and take four (4) capsules (600 mg total) by mouth every evening.    Historical Provider, MD  isosorbide mononitrate (IMDUR) 30 MG 24 hr tablet Take 30 mg by mouth daily.    Historical Provider, MD  meperidine (DEMEROL) 50 MG tablet Take 1-2 tablets (50-100 mg total) by mouth every 4 (four) hours as needed for severe pain. Patient taking differently: Take 50 mg by mouth. Take one tablets every 4 hours as needed for pain (1-5);  Take two tablets every 4 hours as needed for pain (6-10) 08/21/15   Samantha J Rhyne, PA-C  metoprolol succinate (TOPROL-XL) 50 MG 24 hr tablet Take 1 tablet (50 mg total) by mouth daily. With 67m tablet (for 718mtotal).  Take with or immediately following a meal. 09/08/15   Tiffany L Reed, DO  nitroGLYCERIN (NITROSTAT) 0.4 MG SL tablet Place 1 tablet (0.4 mg total) under the tongue every 5 (five) minutes as needed. For chest pain 05/16/14   MiSherren MochaMD  omega-3 acid ethyl esters (LOVAZA) 1 G capsule Take 2 g by mouth 2 (two) times daily.     Historical Provider, MD  pioglitazone-metformin (ACTOPLUS MET) 15-850 MG per tablet Take 1 tablet by mouth daily. Brand name only-does not work if components are given separately per patient 15-850 MG    Historical Provider, MD  potassium chloride (K-DUR) 10 MEQ tablet Take 1 tablet (10 mEq total) by mouth 2 (two) times daily. 04/09/12   Regina J Roczniak, PA-C  prochlorperazine (COMPAZINE) 10 MG tablet Take 1 tablet (10 mg total) by mouth every 6 (six) hours as needed. For nausea 07/24/12   ToRosetta PosnerMD  ramipril (ALTACE) 1.25  MG capsule Take 1.25 mg by mouth daily. BRAND NAME ONLY    Historical Provider, MD  rosuvastatin (CRESTOR) 10 MG tablet Take 10 mg by mouth every Monday.     Historical Provider, MD  sitaGLIPtan (JANUVIA) 100 MG tablet Take 100 mg by mouth at bedtime.     Historical Provider, MD  sodium chloride (MURO 128) 5 % ophthalmic solution Place 1 drop into both eyes daily as needed. (lubrication) 05/21/14   Historical Provider, MD  sodium chloride (OCEAN) 0.65 % SOLN nasal spray Place 2-4 sprays into both nostrils every 4 (four) hours as needed (for nose bleeds).    Historical Provider, MD   BP 143/119 mmHg  Pulse 114  Temp(Src) 97.1 F (36.2 C) (Axillary)  Resp 29  SpO2 100% Physical Exam  Constitutional: She is oriented to person, place, and time. She appears well-developed and well-nourished.  HENT:  Head: Normocephalic and atraumatic.  Mouth/Throat: Oropharynx is clear and moist.  Eyes: Pupils are equal, round, and reactive to light.  Neck: Normal range of motion. Neck supple.  Cardiovascular: Regular rhythm and normal heart sounds.  Tachycardia present.  Exam reveals no gallop and no friction rub.   No murmur heard. Pulmonary/Chest: Tachypnea noted. She is in respiratory distress. She has no wheezes. She has no rhonchi. She has rales. She exhibits no tenderness.  Musculoskeletal: She exhibits edema.  Neurological: She is alert and oriented to person, place, and time. She exhibits normal muscle tone. Coordination normal.  Skin: Skin is warm. No rash noted. She is diaphoretic. No erythema.  Nursing note and vitals reviewed.   ED Course  Procedures (including critical care time) Labs Review Labs Reviewed  TROPONIN I - Abnormal; Notable for the following:    Troponin I 0.07 (*)    All other components within normal limits  CBC WITH DIFFERENTIAL/PLATELET - Abnormal; Notable for the following:    WBC 31.5 (*)    RBC 3.50 (*)    Hemoglobin 9.0 (*)    HCT  28.0 (*)    MCH 25.7 (*)    RDW 17.8 (*)    Neutro Abs 29.3 (*)    Lymphs Abs 0.6 (*)    Monocytes Absolute 1.6 (*)    All other components within normal limits  COMPREHENSIVE METABOLIC PANEL - Abnormal; Notable for the following:    Potassium 6.6 (*)    CO2 19 (*)    Glucose, Bld 151 (*)    Creatinine, Ser 1.14 (*)    Calcium 8.6 (*)    Total Protein 6.2 (*)    Albumin 2.5 (*)    AST 45 (*)    Total Bilirubin 1.4 (*)    GFR calc non Af Amer 48 (*)    GFR calc Af Amer 55 (*)    All other components within normal limits  I-STAT CG4 LACTIC ACID, ED - Abnormal; Notable for the following:    Lactic Acid, Venous 2.34 (*)    All other components within normal limits  I-STAT ARTERIAL BLOOD GAS, ED - Abnormal; Notable for the following:    pO2, Arterial 31.0 (*)    Bicarbonate 24.6 (*)    All other components within normal limits  CULTURE, BLOOD (ROUTINE X 2)  CULTURE, BLOOD (ROUTINE X 2)  CULTURE, EXPECTORATED SPUTUM-ASSESSMENT  GRAM STAIN  BLOOD GAS, ARTERIAL  PROCALCITONIN  HIV ANTIBODY (ROUTINE TESTING)  STREP PNEUMONIAE URINARY ANTIGEN  CREATININE, SERUM  LEGIONELLA PNEUMOPHILA SEROGP 1 UR AG  BLOOD GAS, ARTERIAL  Imaging Review Dg Chest Portable 1 View  12/09/2015  CLINICAL DATA:  Shortness of breath since 5 a.m.; history of CHF and coronary artery disease with angioplasty and stent placement, peripheral vascular disease, diabetes ; former heavy smoker. EXAM: PORTABLE CHEST 1 VIEW COMPARISON:  Portable chest x-ray of June 05, 2015 FINDINGS: The lungs remain hyperinflated. The interstitial markings are increased diffusely. The cardiac silhouette is mildly enlarged. The central pulmonary vascularity is prominent. There is no pleural effusion or pneumothorax. The trachea is midline. IMPRESSION: COPD with superimposed CHF. Atelectasis or developing pneumonia in the lower lobes bilaterally is present. There is no pleural effusion or pneumothorax. Electronically Signed   By:  David  Martinique M.D.   On: 12/09/2015 07:36   I have personally reviewed and evaluated these images and lab results as part of my medical decision-making.   EKG Interpretation   Date/Time:  Wednesday December 09 2015 07:48:32 EDT Ventricular Rate:  87 PR Interval:  146 QRS Duration: 88 QT Interval:  352 QTC Calculation: 423 R Axis:   52 Text Interpretation:  Sinus rhythm Atrial premature complexes Probable  left atrial enlargement Borderline low voltage, extremity leads  Nonspecific T abnormalities, lateral leads Confirmed by Dina Rich  MD,  Loma Sousa ([id number]) on 12/09/2015 7:59:50 AM Also confirmed by Dina Rich  MD,  COURTNEY ([id number]), editor [place] CT, Leda Gauze [phone])  on 12/09/2015 8:56:26 AM       CRITICAL CARE Performed by: Brent General Total critical care time:45 minutes Critical care time was exclusive of separately billable procedures and treating other patients. Critical care was necessary to treat or prevent imminent or life-threatening deterioration. Critical care was time spent personally by me on the following activities: development of treatment plan with patient and/or surrogate as well as nursing, discussions with consultants, evaluation of patient's response to treatment, examination of patient, obtaining history from patient or surrogate, ordering and performing treatments and interventions, ordering and review of laboratory studies, ordering and review of radiographic studies, pulse oximetry and re-evaluation of patient's condition.  The patient was monitored closely.  She was placed on BiPAP.  Patient feels better while wearing the BiPAP.  Her potassium was hemolyzed and the patient's family did not want any further blood draws.  They felt like she needed a PICC line rather than any more sticks.  The patient's family did not allow start a second IV to administer other medications.   Dalia Heading, PA-C 12/09/15 1558  Merryl Hacker, MD 12/09/15 2253

## 2015-12-09 NOTE — Progress Notes (Signed)
Called to patient room for her to use bathroom. Turned patient to clean her and patient immediately became SOB and stated she wanted to be back on BiPAP. Became increasingly SOB and had increased WOB. Called respiratory to assess patient. Placed patient on 5 L /[place]. Patient did not experience relief. Respiratory placed patient back on BiPAP.  Noe Gens, RN

## 2015-12-09 NOTE — ED Notes (Addendum)
Pt's family member requesting pt have something to eat. Informed family that it is not safe for patient to eat at this time due to bi-pap being in place. Oral care preformed with mouth swab.

## 2015-12-09 NOTE — ED Notes (Signed)
Nitroglycerin stopped per [place], [place] from cards.

## 2015-12-10 ENCOUNTER — Inpatient Hospital Stay (HOSPITAL_COMMUNITY): Payer: Medicare Other

## 2015-12-10 DIAGNOSIS — I5021 Acute systolic (congestive) heart failure: Secondary | ICD-10-CM

## 2015-12-10 DIAGNOSIS — I509 Heart failure, unspecified: Secondary | ICD-10-CM

## 2015-12-10 DIAGNOSIS — J9601 Acute respiratory failure with hypoxia: Secondary | ICD-10-CM | POA: Insufficient documentation

## 2015-12-10 LAB — GLUCOSE, CAPILLARY
GLUCOSE-CAPILLARY: 118 mg/dL — AB (ref 65–99)
GLUCOSE-CAPILLARY: 53 mg/dL — AB (ref 65–99)
GLUCOSE-CAPILLARY: 68 mg/dL (ref 65–99)
Glucose-Capillary: 115 mg/dL — ABNORMAL HIGH (ref 65–99)
Glucose-Capillary: 71 mg/dL (ref 65–99)
Glucose-Capillary: 73 mg/dL (ref 65–99)
Glucose-Capillary: 78 mg/dL (ref 65–99)
Glucose-Capillary: 80 mg/dL (ref 65–99)
Glucose-Capillary: 97 mg/dL (ref 65–99)

## 2015-12-10 LAB — HIV ANTIBODY (ROUTINE TESTING W REFLEX): HIV Screen 4th Generation wRfx: NONREACTIVE

## 2015-12-10 LAB — ECHOCARDIOGRAM COMPLETE
CHL CUP MV M VEL: 113
FS: 33 % (ref 28–44)
HEIGHTINCHES: 64 in
IV/PV OW: 1.01
LA diam end sys: 33 mm
LA diam index: 1.9 cm/m2
LA vol A4C: 45.8 ml
LASIZE: 33 mm
LAVOL: 50.7 mL
LAVOLIN: 29.2 mL/m2
LVOT MV VTI INDEX: 0.88 cm2/m2
LVOT MV VTI: 1.52
LVOT VTI: 25.2 cm
LVOT area: 2.54 cm2
LVOT diameter: 18 mm
LVOTPV: 92.8 cm/s
LVOTSV: 64 mL
MV Annulus VTI: 42.2 cm
Mean grad: 6 mmHg
PW: 10.7 mm — AB (ref 0.6–1.1)
RV LATERAL S' VELOCITY: 13.5 cm/s
TAPSE: 22.8 mm
WEIGHTICAEL: 2321 [oz_av]

## 2015-12-10 LAB — BASIC METABOLIC PANEL
Anion gap: 8 (ref 5–15)
BUN: 22 mg/dL — AB (ref 6–20)
CO2: 23 mmol/L (ref 22–32)
Calcium: 8.3 mg/dL — ABNORMAL LOW (ref 8.9–10.3)
Chloride: 103 mmol/L (ref 101–111)
Creatinine, Ser: 1.04 mg/dL — ABNORMAL HIGH (ref 0.44–1.00)
GFR calc Af Amer: >60 mL/min (ref >60)
GFR, EST NON AFRICAN AMERICAN: 53 mL/min — AB (ref >60)
GLUCOSE: 133 mg/dL — AB (ref 65–99)
POTASSIUM: 4.4 mmol/L (ref 3.5–5.1)
Sodium: 134 mmol/L — ABNORMAL LOW (ref 135–145)

## 2015-12-10 LAB — CBC WITH DIFFERENTIAL/PLATELET
BASOS PCT: 0 %
Basophils Absolute: 0 10*3/uL (ref 0.0–0.1)
Eosinophils Absolute: 0 10*3/uL (ref 0.0–0.7)
Eosinophils Relative: 0 %
HEMATOCRIT: 21.6 % — AB (ref 36.0–46.0)
Hemoglobin: 6.7 g/dL — CL (ref 12.0–15.0)
LYMPHS ABS: 1.3 10*3/uL (ref 0.7–4.0)
LYMPHS PCT: 13 %
MCH: 25.3 pg — AB (ref 26.0–34.0)
MCHC: 31 g/dL (ref 30.0–36.0)
MCV: 81.5 fL (ref 78.0–100.0)
MONO ABS: 0.7 10*3/uL (ref 0.1–1.0)
MONOS PCT: 7 %
NEUTROS ABS: 7.9 10*3/uL — AB (ref 1.7–7.7)
Neutrophils Relative %: 80 %
Platelets: 316 10*3/uL (ref 150–400)
RBC: 2.65 MIL/uL — ABNORMAL LOW (ref 3.87–5.11)
RDW: 17.6 % — AB (ref 11.5–15.5)
WBC: 9.9 10*3/uL (ref 4.0–10.5)

## 2015-12-10 LAB — CBC
HCT: 22.3 % — ABNORMAL LOW (ref 36.0–46.0)
Hemoglobin: 6.9 g/dL — CL (ref 12.0–15.0)
MCH: 24.9 pg — ABNORMAL LOW (ref 26.0–34.0)
MCHC: 30.9 g/dL (ref 30.0–36.0)
MCV: 80.5 fL (ref 78.0–100.0)
PLATELETS: 294 10*3/uL (ref 150–400)
RBC: 2.77 MIL/uL — ABNORMAL LOW (ref 3.87–5.11)
RDW: 17.4 % — AB (ref 11.5–15.5)
WBC: 9.4 10*3/uL (ref 4.0–10.5)

## 2015-12-10 LAB — PREPARE RBC (CROSSMATCH)

## 2015-12-10 LAB — COMPREHENSIVE METABOLIC PANEL
ALBUMIN: 2 g/dL — AB (ref 3.5–5.0)
ALK PHOS: 76 U/L (ref 38–126)
ALT: 20 U/L (ref 14–54)
ANION GAP: 8 (ref 5–15)
AST: 25 U/L (ref 15–41)
BUN: 21 mg/dL — ABNORMAL HIGH (ref 6–20)
CALCIUM: 8.2 mg/dL — AB (ref 8.9–10.3)
CO2: 23 mmol/L (ref 22–32)
Chloride: 102 mmol/L (ref 101–111)
Creatinine, Ser: 1.04 mg/dL — ABNORMAL HIGH (ref 0.44–1.00)
GFR, EST NON AFRICAN AMERICAN: 53 mL/min — AB (ref >60)
Glucose, Bld: 133 mg/dL — ABNORMAL HIGH (ref 65–99)
Potassium: 4.3 mmol/L (ref 3.5–5.1)
SODIUM: 133 mmol/L — AB (ref 135–145)
TOTAL PROTEIN: 5.1 g/dL — AB (ref 6.5–8.1)
Total Bilirubin: 0.7 mg/dL (ref 0.3–1.2)

## 2015-12-10 LAB — MAGNESIUM
MAGNESIUM: 1.8 mg/dL (ref 1.7–2.4)
Magnesium: 1.4 mg/dL — ABNORMAL LOW (ref 1.7–2.4)
Magnesium: 1.4 mg/dL — ABNORMAL LOW (ref 1.7–2.4)

## 2015-12-10 LAB — PHOSPHORUS
PHOSPHORUS: 3.6 mg/dL (ref 2.5–4.6)
PHOSPHORUS: 2.9 mg/dL (ref 2.5–4.6)

## 2015-12-10 LAB — STREP PNEUMONIAE URINARY ANTIGEN: STREP PNEUMO URINARY ANTIGEN: NEGATIVE

## 2015-12-10 LAB — PROCALCITONIN: PROCALCITONIN: 6.11 ng/mL

## 2015-12-10 MED ORDER — MAGNESIUM SULFATE 2 GM/50ML IV SOLN
2.0000 g | Freq: Once | INTRAVENOUS | Status: AC
  Administered 2015-12-10: 2 g via INTRAVENOUS
  Filled 2015-12-10: qty 50

## 2015-12-10 MED ORDER — COLLAGENASE 250 UNIT/GM EX OINT
TOPICAL_OINTMENT | Freq: Every day | CUTANEOUS | Status: DC
  Administered 2015-12-10 – 2015-12-13 (×4): via TOPICAL
  Filled 2015-12-10: qty 30

## 2015-12-10 MED ORDER — MIDAZOLAM HCL 2 MG/2ML IJ SOLN
INTRAMUSCULAR | Status: AC
  Filled 2015-12-10: qty 2

## 2015-12-10 MED ORDER — VITAL AF 1.2 CAL PO LIQD: 1000.0000 mL | ORAL | Status: DC

## 2015-12-10 MED ORDER — DEXTROSE 50 % IV SOLN
25.0000 mL | Freq: Once | INTRAVENOUS | Status: AC
  Administered 2015-12-10: 25 mL via INTRAVENOUS

## 2015-12-10 MED ORDER — INSULIN GLARGINE 100 UNIT/ML ~~LOC~~ SOLN
7.0000 [IU] | Freq: Every day | SUBCUTANEOUS | Status: DC
  Administered 2015-12-10 – 2015-12-13 (×4): 7 [IU] via SUBCUTANEOUS
  Filled 2015-12-10 (×5): qty 0.07

## 2015-12-10 MED ORDER — FAMOTIDINE IN NACL 20-0.9 MG/50ML-% IV SOLN
20.0000 mg | INTRAVENOUS | Status: DC
  Administered 2015-12-11 – 2015-12-12 (×2): 20 mg via INTRAVENOUS
  Filled 2015-12-10 (×2): qty 50

## 2015-12-10 MED ORDER — DEXTROSE 50 % IV SOLN
25.0000 mL | Freq: Once | INTRAVENOUS | Status: AC
  Administered 2015-12-10: 25 mL via INTRAVENOUS
  Filled 2015-12-10: qty 50

## 2015-12-10 MED ORDER — FUROSEMIDE 10 MG/ML IJ SOLN
60.0000 mg | Freq: Three times a day (TID) | INTRAMUSCULAR | Status: DC
  Administered 2015-12-10 – 2015-12-12 (×6): 60 mg via INTRAVENOUS
  Filled 2015-12-10 (×6): qty 6

## 2015-12-10 MED ORDER — MIDAZOLAM HCL 2 MG/2ML IJ SOLN
1.0000 mg | INTRAMUSCULAR | Status: DC | PRN
  Administered 2015-12-10 (×2): 1 mg via INTRAVENOUS
  Filled 2015-12-10: qty 2

## 2015-12-10 MED ORDER — DEXTROSE 50 % IV SOLN
INTRAVENOUS | Status: AC
  Filled 2015-12-10: qty 50

## 2015-12-10 MED ORDER — DEXTROSE-NACL 5-0.9 % IV SOLN
INTRAVENOUS | Status: DC
  Administered 2015-12-10: 21:00:00 via INTRAVENOUS

## 2015-12-10 MED ORDER — SODIUM CHLORIDE 0.9 % IV SOLN: Freq: Once | INTRAVENOUS | Status: DC

## 2015-12-10 NOTE — Progress Notes (Signed)
Hypoglycemic Event  CBG: 53  Treatment: D50 IV 25 mL  Symptoms: None  Follow-up CBG: Time:2058 CBG Result:80  Possible Reasons for Event: Inadequate meal intake  Comments/MD notified:Sommers    Wright Gravely L

## 2015-12-10 NOTE — Progress Notes (Signed)
Pt arrived to unit via bed from [facility].  RN and RT transporting.  Vitals and assessment- see flowsheets.  Pt oriented to room, staff and plan of care.   Addendum 2205: Pt has been intubated, og and central line placed.  Family updated and present in pts room.

## 2015-12-10 NOTE — Consult Note (Addendum)
WOC wound consult note Reason for Consult: Left thigh and toes Patient is intubated on vent.  Husband met me at door to explain wound care that has been provided at the wound care center. Per husband patient has been receiving HBO treatments, he has used Santyl and now the patient has an Therskin graft  to the left thigh wound.  Husband reports that dressing was to only have topper dressing changed today and patient was to return to wound care center tom. On Friday.  I have contacted the wound care center an verified same.  I have requested Dr. Robson contact WOC nurse for further clarification of orders moving forward. NOrmally WOC serves as expert consult while patient inpatient status to recommend wound care, however patient's husband did not want me to assess the wound.  Dressing procedure/placement/frequency: Currently, only orders to change foam topper daily and PRN soilage, do not change dressing that is steristriped to patient.  Will await contact from wound care center.   WOC nurse will follow along with you Kaylee Austin RN,CWOCN [phone]     11:34 Call from the wound care center, [name] advised the WCC was using Santyl on the lateral leg ulcer and to keep the Theraskin site (steristrip in place) intact and only change the foam topper.  To be changed daily.   Dry dressing to the toes.  M. Austin RN,CWOCN 

## 2015-12-10 NOTE — Progress Notes (Signed)
CRITICAL VALUE ALERT  Critical value received:  Hemoglobin 6.7  Date of notification:  12/10/15  Time of notification:  0557  Critical value read back:Yes.    Nurse who received alert:  DeDe  MD notified (1st page):  Casa  Time of first page:  [phone]  MD notified (2nd page):  Time of second page:  Responding MD:  Dorris Fetch  Time MD responded:  [phone]

## 2015-12-10 NOTE — Progress Notes (Signed)
Initial Nutrition Assessment  DOCUMENTATION CODES:   Not applicable  INTERVENTION:  Initiate tube feeding via OGT with Vital AF 1.2 @ goal rate of 60 ml/hr (1200 ml/d) to provide 1440 kcals and 90 g of protein. This meets >99% estimated needs.     NUTRITION DIAGNOSIS:   Inadequate oral intake related to inability to eat as evidenced by NPO status.   GOAL:   Patient will meet greater than or equal to 90% of their needs  MONITOR:   Vent status, Labs, Weight trends, Skin, I & O's  REASON FOR ASSESSMENT:   Consult Enteral/tube feeding initiation and management  ASSESSMENT:   Pt with medical history significant of HTN, HLD, DM, GERD, Raynaud's disease,  PVD, nephrolithiasis, anemia, CAD, MI, CHF, diabetic neuropathy, presenting w/ AMS and difficulty breathing.   Pt intubated on vent support (6/7). Temp (24hrs), Avg:98.3 F (36.8 C), Min:97.6 F (36.4 C), Max:98.7 F (37.1 C) VM: 12.4 ml Spoke with family at bedside. Wounds and BKA on left leg. Tube feeding stopped d/t vomit episode (6:37 am today).   Per chart, weight loss of 10 lbs in 4 months (6.5%). This is not significant for time frame.  NFPE reveals mild muscle depletion in clavicles and temples (suspect d/t aging), no fat depletion, and no edema.  Labs reviewed; Na 133, BUN 21, Ca 8.2, GFR 53, mag 1.4, CBGs 97-154. Meds reviewed; Lasix.  Diet Order:  Diet NPO time specified  Skin:  Wound (see comment) (Diab toe ulcer on L, skin graft L thigh)  Last BM:  6/5  Height:   Ht Readings from Last 1 Encounters:  12/09/15 5\' 4"  (1.626 m)    Weight:   Wt Readings from Last 1 Encounters:  12/10/15 145 lb 1 oz (65.8 kg)    Ideal Body Weight:  50.9 kg  BMI:  27.5 kg/m^2 (adjusted for BKA) Estimated Nutritional Needs:   Kcal:  1452  Protein:  90-100 g  Fluid:  Per MD  EDUCATION NEEDS:   No education needs identified at this time  Beryle Quant, MS [facility] Dietetic Intern Pager [phone]

## 2015-12-10 NOTE — Progress Notes (Signed)
Wasted 60mg  Fentanyl in sink, witnessed by Eddie North, RN

## 2015-12-10 NOTE — Progress Notes (Signed)
eLink Physician-Brief Progress Note Patient Name: [name] DOB: May 25, 1946 MRN: [id number]   Date of Service  12/10/2015  HPI/Events of Note  Hgb=6.7  eICU Interventions  Will order 1 unit of PRBC     Intervention Category Major Interventions: Other:  Erin Fulling 12/10/2015, 5:59 AM

## 2015-12-10 NOTE — Progress Notes (Signed)
  Echocardiogram 2D Echocardiogram has been performed.  Delcie Roch 12/10/2015, 11:32 AM

## 2015-12-10 NOTE — Progress Notes (Signed)
eLink Physician-Brief Progress Note Patient Name: [name] DOB: Apr 07, 1946 MRN: [id number]   Date of Service  12/10/2015  HPI/Events of Note  increased pac's and pvc's, mag=1.4  eICU Interventions  Replace mag     Intervention Category Major Interventions: Arrhythmia - evaluation and management;Electrolyte abnormality - evaluation and management  Chakia Counts 12/10/2015, 5:33 AM

## 2015-12-10 NOTE — Progress Notes (Signed)
Social visit made to pt. She is awake and alert and able to answer questions by nodding her head. When asked if feeling better she noded her head in affirmation. Much appreciate CCMs continued care for this patient. Anticipate extubation when able per CCM and transfer to [facility] when appropriate.   Shelly Flatten, MD [facility] 12/10/2015, 7:47 AM

## 2015-12-10 NOTE — Progress Notes (Signed)
eLink Physician-Brief Progress Note Patient Name: [name] DOB: Jun 27, 1946 MRN: [id number]   Date of Service  12/10/2015  HPI/Events of Note  Blood glucose = 53. Tube feeds held d/t vomiting. Getting D50 now.  eICU Interventions  Will order: 1. D5 0.9 NaCl to run IV at 50 mL/hour.  2. Decrease Lantus dose from 14 units to 7 units tonight.      Intervention Category Major Interventions: Other:  Lenell Antu 12/10/2015, 8:29 PM

## 2015-12-10 NOTE — Progress Notes (Signed)
Notified MD of small amount of vomit by patient. Suctioned with yankeur, tube feedings turned off, og back to low intermittent wall suction.

## 2015-12-10 NOTE — Progress Notes (Signed)
SUBJECTIVE:  Intubated  OBJECTIVE:   Vitals:   Filed Vitals:   12/10/15 1000 12/10/15 1015 12/10/15 1100 12/10/15 1120  BP: 122/49 114/58 121/46   Pulse: 81 81 69   Temp: 98.7 F (37.1 C) 97.9 F (36.6 C)  98.7 F (37.1 C)  TempSrc:    Axillary  Resp: Height:      Weight:      SpO2: 100% 100% 100%    I&O's:   Intake/Output Summary (Last 24 hours) at 12/10/15 1137 Last data filed at 12/10/15 1100  Gross per 24 hour  Intake 1583.55 ml  Output   2025 ml  Net -441.45 ml   TELEMETRY: Reviewed telemetry pt in NSR:     PHYSICAL EXAM General: Well developed, well nourished, in no acute distress, intubated Head:   Normal cephalic and atramatic , eyes open Lungs:  Coarse BS bilaterally to auscultation. Heart:   HRRR S1 S2  No JVD.   Abdomen: abdomen soft and non-tender Msk:  Back normal,  Normal strength and tone for age. Extremities:   No edema.   Neuro: intubated Psych:  intubated Skin: No rash   LABS: Basic Metabolic Panel:  Recent Labs  16/10/96 2312 12/10/15 0430  NA 134* 133*  134*  K 5.2* 4.3  4.4  CL 102 102  103  CO2 GLUCOSE 167* 133*  133*  BUN 19 21*  22*  CREATININE 1.13* 1.04*  1.04*  CALCIUM 8.4* 8.2*  8.3*  MG 1.5* 1.4*  1.4*  PHOS 4.4 3.6   Liver Function Tests:  Recent Labs  12/09/15 0734 12/10/15 0430  AST 45* 25  ALT 19 20  ALKPHOS 94 76  BILITOT 1.4* 0.7  PROT 6.2* 5.1*  ALBUMIN 2.5* 2.0*   No results for input(s): LIPASE, AMYLASE in the last 72 hours. CBC:  Recent Labs  12/09/15 0734 12/10/15 0430  WBC 31.5* 9.4  9.9  NEUTROABS 29.3* 7.9*  HGB 9.0* 6.9*  6.7*  HCT 28.0* 22.3*  21.6*  MCV 80.0 80.5  81.5  PLT PLATELET CLUMPS NOTED ON SMEAR, COUNT APPEARS ADEQUATE 294  316   Cardiac Enzymes:  Recent Labs  12/09/15 0734  TROPONINI 0.07*   BNP: Invalid input(s): POCBNP D-Dimer: No results for input(s): DDIMER in the last 72 hours. Hemoglobin A1C: No results for input(s):  HGBA1C in the last 72 hours. Fasting Lipid Panel: No results for input(s): CHOL, HDL, LDLCALC, TRIG, CHOLHDL, LDLDIRECT in the last 72 hours. Thyroid Function Tests: No results for input(s): TSH, T4TOTAL, T3FREE, THYROIDAB in the last 72 hours.  Invalid input(s): FREET3 Anemia Panel: No results for input(s): VITAMINB12, FOLATE, FERRITIN, TIBC, IRON, RETICCTPCT in the last 72 hours. Coag Panel:   Lab Results  Component Value Date   INR 1.07 08/19/2015   INR 1.01 06/05/2015   INR 0.94 05/14/2014    RADIOLOGY: Dg Chest Port 1 View  12/10/2015  CLINICAL DATA:  Sepsis, shortness of breath. EXAM: PORTABLE CHEST 1 VIEW COMPARISON:  Portable chest x-ray of December 09, 2015 FINDINGS: The lungs remain hyperinflated. The interstitial markings remain increased diffusely and have become more confluent. The retrocardiac region is more dense. There is no significant pleural effusion. The cardiac silhouette is mildly enlarged. The pulmonary vascularity remains engorged. The endotracheal tube tip lies 3.7 cm above the carina. The esophagogastric tube tip projects below the inferior margin of the image. The left internal jugular venous catheter tip projects over the  proximal SVC. IMPRESSION: Persistent interstitial and alveolar opacities bilaterally worrisome for pneumonia though there may be an element of pulmonary edema. Increasing retrocardiac and right lower lung opacities. The support tubes are in reasonable position. Electronically Signed   By: David  [place] M.D.   On: 12/10/2015 07:52   Portable Chest Xray  12/09/2015  CLINICAL DATA:  Status post line placement EXAM: PORTABLE CHEST 1 VIEW COMPARISON:  12/09/2015 FINDINGS: A a left jugular central line is now seen in the mid superior vena cava. No pneumothorax is noted. An endotracheal tube and nasogastric catheter are noted in satisfactory position. Bilateral patchy opacities are again identified somewhat more consolidated than that seen on the prior exam. This  may represent some mild improvement. No bony abnormality is seen. Cardiac shadow is stable. IMPRESSION: Tubes and lines as described above. Some more consolidated infiltrates are noted in perihilar distribution bilaterally. Electronically Signed   By: Alcide Clever M.D.   On: 12/09/2015 22:48   Dg Chest Port 1 View  12/09/2015  CLINICAL DATA:  Shortness of breath for 1 day EXAM: PORTABLE CHEST 1 VIEW COMPARISON:  Study obtained earlier in the day FINDINGS: There has been progression of interstitial and alveolar edema diffusely. Heart is enlarged with pulmonary venous hypertension. Atherosclerotic calcification is noted in the aorta. No adenopathy. IMPRESSION: Evidence of progression of congestive heart failure. There may be a degree of superimposed ARDS. Electronically Signed   By: Bretta Bang III M.D.   On: 12/09/2015 19:17   Dg Chest Portable 1 View  12/09/2015  CLINICAL DATA:  Shortness of breath since 5 a.m.; history of CHF and coronary artery disease with angioplasty and stent placement, peripheral vascular disease, diabetes ; former heavy smoker. EXAM: PORTABLE CHEST 1 VIEW COMPARISON:  Portable chest x-ray of June 05, 2015 FINDINGS: The lungs remain hyperinflated. The interstitial markings are increased diffusely. The cardiac silhouette is mildly enlarged. The central pulmonary vascularity is prominent. There is no pleural effusion or pneumothorax. The trachea is midline. IMPRESSION: COPD with superimposed CHF. Atelectasis or developing pneumonia in the lower lobes bilaterally is present. There is no pleural effusion or pneumothorax. Electronically Signed   By: David  [place] M.D.   On: 12/09/2015 07:36   Dg Abd Portable 1v  12/09/2015  CLINICAL DATA:  Status post nasogastric catheter placement EXAM: PORTABLE ABDOMEN - 1 VIEW COMPARISON:  None. FINDINGS: Nasogastric catheter is noted coiled within the stomach. Fecal material is noted throughout the colon. No free air is seen. IMPRESSION:  Nasogastric catheter within the stomach. Electronically Signed   By: Alcide Clever M.D.   On: 12/09/2015 22:48      ASSESSMENT: CAD, respiratory failure  PLAN:  *I personally reviewed the echo.  LV function appears normal.  Await final reading.  Agree with Lasix to keep I<O.  Low level troponin not likely indicating significant active ischemia.   Transfusing for anemia.   ABx for pneumonia  Corky Crafts, MD  12/10/2015  11:37 AM

## 2015-12-10 NOTE — Consult Note (Signed)
PULMONARY / CRITICAL CARE MEDICINE   Name: [name] MRN: [id number] DOB: [birth year]    ADMISSION DATE:  12/09/2015 CONSULTATION DATE:  6/7  REFERRING MD:  Teresa Coombs TRH  CHIEF COMPLAINT:  SOB  HISTORY OF PRESENT ILLNESS:   70 year old female past medical history as below, which includes severe PVD (s/p s/p R BKA in 2013, L fem-pop bypass in 2009, aortobifemoral bypass in 2013, recent left fem-pop above knee to below-knee popliteal bypass on 08/19/2015), CAD (PCI of LCx in 2010, cath in 2013 showing patency of LCx stent and mild nonobstructive CAD in LAD and normal LV function), DM2, Diastolic CHF, subclavian steal syndrome, and Anemia.   She presented to [facility] emergency department 6/7 with complaints of cough/shortness of breath 24 hours. She describes cough is minimally productive and is unable to describe character sputum. Due to not feeling well, she slept in a different room that her husband the night prior. At 5 AM her husband could hear her from the other room having very labored breathing. When he got to her she was lethargic and very dyspneic. EMS transported patient to [facility] emergency department where she was started on BiPAP for increased work of breathing. Chest x-ray was consistent with pulmonary edema versus pneumonia. She was value by cardiology in the emergency department who recommended low-dose Lasix for diuresis. They felt as though pneumonia may be causing a significant portion of her symptoms as well.  SUBJECTIVE:  Intubated No pressors  no fevers  VITAL SIGNS: BP 101/48 mmHg  Pulse 75  Temp(Src) 98.4 F (36.9 C) (Oral)  Resp 24  Ht  (1.626 m)  Wt 65.8 kg (145 lb 1 oz)  BMI 24.89 kg/m2  SpO2 100%  HEMODYNAMICS: CVP:  [7 mmHg-8 mmHg] 8 mmHg  VENTILATOR SETTINGS: Vent Mode:  [-] PRVC FiO2 (%):  [40 %-100 %] 40 % Set Rate:  [24 bmp] 24 bmp Vt Set:  [440 mL] 440 mL PEEP:  [5 cmH20] 5 cmH20 Plateau Pressure:  [17 cmH20-20 cmH20] 17  cmH20  INTAKE / OUTPUT: I/O last 3 completed shifts: In: 1258.6 [I.V.:616.6; NG/GT:292; IV Piggyback:350] Out: 1110 [Urine:1110]  PHYSICAL EXAMINATION: General:  Chronically ill appearing, calm Neuro:  Awake, FC HEENT:  NCA, jvd present Cardiovascular:  s1 s2 RRR, pvc Lungs: ronchi moderate Abdomen:  BS+, soft, nontender Musculoskeletal:  S/p BKA R leg Skin:  Wound L hip, well dressed  LABS:  BMET  Recent Labs Lab 12/09/15 0734 12/09/15 2312 12/10/15 0430  NA 136 134* 133*  134*  K 6.6* 5.2* 4.3  4.4  CL 104 102 102  103  CO2 19* BUN 15 19 21*  22*  CREATININE 1.14* 1.13* 1.04*  1.04*  GLUCOSE 151* 167* 133*  133*    Electrolytes  Recent Labs Lab 12/09/15 0734 12/09/15 2312 12/10/15 0430  CALCIUM 8.6* 8.4* 8.2*  8.3*  MG  --  1.5* 1.4*  1.4*  PHOS  --  4.4 3.6    CBC  Recent Labs Lab 12/09/15 0734 12/10/15 0430  WBC 31.5* 9.4  9.9  HGB 9.0* 6.9*  6.7*  HCT 28.0* 22.3*  21.6*  PLT PLATELET CLUMPS NOTED ON SMEAR, COUNT APPEARS ADEQUATE 294  316    Coag's No results for input(s): APTT, INR in the last 168 hours.  Sepsis Markers  Recent Labs Lab 12/09/15 0736 12/09/15 2312  LATICACIDVEN 2.34*  --   PROCALCITON  --  6.11    ABG  Recent  Labs Lab 12/09/15 1308  PHART 7.364  PCO2ART 43.1  PO2ART 31.0*    Liver Enzymes  Recent Labs Lab 12/09/15 0734 12/10/15 0430  AST 45* 25  ALT 19 20  ALKPHOS 94 76  BILITOT 1.4* 0.7  ALBUMIN 2.5* 2.0*    Cardiac Enzymes  Recent Labs Lab 12/09/15 0734  TROPONINI 0.07*    Glucose  Recent Labs Lab 12/07/15 1124 12/08/15 0932 12/08/15 1145 12/09/15 2229 12/10/15 0114 12/10/15 0402  GLUCAP 147* 146* 131* 154* 118* 97    Imaging Dg Chest Port 1 View  12/10/2015  CLINICAL DATA:  Sepsis, shortness of breath. EXAM: PORTABLE CHEST 1 VIEW COMPARISON:  Portable chest x-ray of December 09, 2015 FINDINGS: The lungs remain hyperinflated. The interstitial markings remain  increased diffusely and have become more confluent. The retrocardiac region is more dense. There is no significant pleural effusion. The cardiac silhouette is mildly enlarged. The pulmonary vascularity remains engorged. The endotracheal tube tip lies 3.7 cm above the carina. The esophagogastric tube tip projects below the inferior margin of the image. The left internal jugular venous catheter tip projects over the proximal SVC. IMPRESSION: Persistent interstitial and alveolar opacities bilaterally worrisome for pneumonia though there may be an element of pulmonary edema. Increasing retrocardiac and right lower lung opacities. The support tubes are in reasonable position. Electronically Signed   By: David  [place] M.D.   On: 12/10/2015 07:52   Portable Chest Xray  12/09/2015  CLINICAL DATA:  Status post line placement EXAM: PORTABLE CHEST 1 VIEW COMPARISON:  12/09/2015 FINDINGS: A a left jugular central line is now seen in the mid superior vena cava. No pneumothorax is noted. An endotracheal tube and nasogastric catheter are noted in satisfactory position. Bilateral patchy opacities are again identified somewhat more consolidated than that seen on the prior exam. This may represent some mild improvement. No bony abnormality is seen. Cardiac shadow is stable. IMPRESSION: Tubes and lines as described above. Some more consolidated infiltrates are noted in perihilar distribution bilaterally. Electronically Signed   By: Alcide Clever M.D.   On: 12/09/2015 22:48   Dg Chest Port 1 View  12/09/2015  CLINICAL DATA:  Shortness of breath for 1 day EXAM: PORTABLE CHEST 1 VIEW COMPARISON:  Study obtained earlier in the day FINDINGS: There has been progression of interstitial and alveolar edema diffusely. Heart is enlarged with pulmonary venous hypertension. Atherosclerotic calcification is noted in the aorta. No adenopathy. IMPRESSION: Evidence of progression of congestive heart failure. There may be a degree of superimposed  ARDS. Electronically Signed   By: Bretta Bang III M.D.   On: 12/09/2015 19:17   Dg Abd Portable 1v  12/09/2015  CLINICAL DATA:  Status post nasogastric catheter placement EXAM: PORTABLE ABDOMEN - 1 VIEW COMPARISON:  None. FINDINGS: Nasogastric catheter is noted coiled within the stomach. Fecal material is noted throughout the colon. No free air is seen. IMPRESSION: Nasogastric catheter within the stomach. Electronically Signed   By: Alcide Clever M.D.   On: 12/09/2015 22:48     STUDIES:    CULTURES: 6/7 Blood >  6/7 resp >   ANTIBIOTICS: Vanc 6/7 >  Cefepime 6/7> Levaquin 6/7 >    SIGNIFICANT EVENTS: Moved to ICU 6/7 (day of admission) 6/7- intubated  LINES/TUBES: 6/7 ETT >  6/7 CVL >  DISCUSSION: 70 y/o female with multiple comorbid illnesses including significant peripheral vascular disease, CAD is here now with acute hypoxemic respiratory failure likely due to HCAP based on her elevated WBC and  cough at home.  The ddx includes acute pulmonary edema from decompensated heart failure exacerbation but this seems less likely now considering the progression of her respiratory failure despite documented diuresis today.  Her husband changed her code status after lengthy conversation with Dr. Konrad Dolores this evening.  She is now full code.  ASSESSMENT / PLAN:  PULMONARY A: Acute respiratory failure with hypoxemia HCAP Favor edema P:   Neg balance goals, lasix pcxr in am  Keep same MV - requires rare 24 Keep 8 cc/kg SBT planned, cpap 5 ps 5 Favor neg balance before extubation, not there yet  CARDIOVASCULAR A:  CAD Peripheral vascular disease Chronic diastolic heart failure > possible based on Cardiology notes P:  Continue lasix cvp noted 5, accuracy>? laxt echo reviewed Trop neg  RENAL A:   Hyponatremia hypomag P:   kvo needed Increase lasix Chem in am  supp mag  GASTROINTESTINAL A:   NPO, ileus? P:   TF re trickel in am  likely Famotidine for GI  prophylaxis  HEMATOLOGIC A:   Anemia P:  Monitor for bleeding For 1 unit scd lovenox  INFECTIOUS A:   HCAP possible, unclear Chronic wounds P:   maintain vanc, cef Pct trend  ENDOCRINE A:   DM2 P:   SSI  NEUROLOGIC A:   Sedation needs for vent synchrony P:   RASS goal: -2 Fentanyl gtt Prn versed - dc Xanax prn home   FAMILY  - Updates: none  - Inter-disciplinary family meet or Palliative Care meeting due by:  6/14  Ccm time 35 min   Mcarthur Rossetti. Tyson Alias, MD, FACP Pgr: [phone] [facility]

## 2015-12-11 ENCOUNTER — Inpatient Hospital Stay (HOSPITAL_COMMUNITY): Payer: Medicare Other

## 2015-12-11 DIAGNOSIS — I509 Heart failure, unspecified: Secondary | ICD-10-CM

## 2015-12-11 LAB — CBC WITH DIFFERENTIAL/PLATELET
BASOS ABS: 0 10*3/uL (ref 0.0–0.1)
Basophils Relative: 0 %
Eosinophils Absolute: 0.1 10*3/uL (ref 0.0–0.7)
Eosinophils Relative: 1 %
HEMATOCRIT: 24.4 % — AB (ref 36.0–46.0)
Hemoglobin: 7.8 g/dL — ABNORMAL LOW (ref 12.0–15.0)
LYMPHS PCT: 11 %
Lymphs Abs: 1.1 10*3/uL (ref 0.7–4.0)
MCH: 25.6 pg — ABNORMAL LOW (ref 26.0–34.0)
MCHC: 32 g/dL (ref 30.0–36.0)
MCV: 80 fL (ref 78.0–100.0)
Monocytes Absolute: 1 10*3/uL (ref 0.1–1.0)
Monocytes Relative: 11 %
NEUTROS ABS: 7.5 10*3/uL (ref 1.7–7.7)
NEUTROS PCT: 77 %
PLATELETS: 257 10*3/uL (ref 150–400)
RBC: 3.05 MIL/uL — AB (ref 3.87–5.11)
RDW: 17.3 % — ABNORMAL HIGH (ref 11.5–15.5)
WBC: 9.7 10*3/uL (ref 4.0–10.5)

## 2015-12-11 LAB — TYPE AND SCREEN
ABO/RH(D): O POS
Antibody Screen: NEGATIVE
Unit division: 0

## 2015-12-11 LAB — BASIC METABOLIC PANEL
ANION GAP: 10 (ref 5–15)
BUN: 18 mg/dL (ref 6–20)
CHLORIDE: 102 mmol/L (ref 101–111)
CO2: 24 mmol/L (ref 22–32)
Calcium: 8 mg/dL — ABNORMAL LOW (ref 8.9–10.3)
Creatinine, Ser: 1.03 mg/dL — ABNORMAL HIGH (ref 0.44–1.00)
GFR calc non Af Amer: 54 mL/min — ABNORMAL LOW (ref >60)
Glucose, Bld: 123 mg/dL — ABNORMAL HIGH (ref 65–99)
POTASSIUM: 3.2 mmol/L — AB (ref 3.5–5.1)
Sodium: 136 mmol/L (ref 135–145)

## 2015-12-11 LAB — GLUCOSE, CAPILLARY
GLUCOSE-CAPILLARY: 73 mg/dL (ref 65–99)
GLUCOSE-CAPILLARY: 121 mg/dL — AB (ref 65–99)
Glucose-Capillary: 181 mg/dL — ABNORMAL HIGH (ref 65–99)
Glucose-Capillary: 116 mg/dL — ABNORMAL HIGH (ref 65–99)
Glucose-Capillary: 193 mg/dL — ABNORMAL HIGH (ref 65–99)

## 2015-12-11 LAB — PHOSPHORUS: PHOSPHORUS: 2.6 mg/dL (ref 2.5–4.6)

## 2015-12-11 LAB — ECHOCARDIOGRAM LIMITED
HEIGHTINCHES: 64 in
WEIGHTICAEL: 2250.46 [oz_av]

## 2015-12-11 LAB — APTT: APTT: 42 s — AB (ref 24–37)

## 2015-12-11 LAB — PROTIME-INR
INR: 1.6 — ABNORMAL HIGH (ref 0.00–1.49)
Prothrombin Time: 19.1 seconds — ABNORMAL HIGH (ref 11.6–15.2)

## 2015-12-11 LAB — HEPARIN LEVEL (UNFRACTIONATED): HEPARIN UNFRACTIONATED: 0.31 [IU]/mL (ref 0.30–0.70)

## 2015-12-11 LAB — MAGNESIUM: Magnesium: 1.6 mg/dL — ABNORMAL LOW (ref 1.7–2.4)

## 2015-12-11 MED ORDER — POTASSIUM CHLORIDE 20 MEQ/15ML (10%) PO SOLN
40.0000 meq | Freq: Once | ORAL | Status: AC
  Administered 2015-12-11: 40 meq
  Filled 2015-12-11: qty 30

## 2015-12-11 MED ORDER — PERFLUTREN LIPID MICROSPHERE
INTRAVENOUS | Status: AC
  Filled 2015-12-11: qty 10

## 2015-12-11 MED ORDER — MAGNESIUM SULFATE 50 % IJ SOLN
4.0000 g | Freq: Once | INTRAMUSCULAR | Status: AC
  Administered 2015-12-11: 4 g via INTRAVENOUS
  Filled 2015-12-11 (×2): qty 8

## 2015-12-11 MED ORDER — POTASSIUM CHLORIDE 10 MEQ/50ML IV SOLN
10.0000 meq | INTRAVENOUS | Status: AC
  Administered 2015-12-11 (×4): 10 meq via INTRAVENOUS
  Filled 2015-12-11: qty 50

## 2015-12-11 MED ORDER — PROCHLORPERAZINE EDISYLATE 5 MG/ML IJ SOLN
10.0000 mg | Freq: Four times a day (QID) | INTRAMUSCULAR | Status: DC | PRN
  Administered 2015-12-11 – 2015-12-12 (×4): 10 mg via INTRAVENOUS
  Filled 2015-12-11 (×7): qty 2

## 2015-12-11 MED ORDER — ONDANSETRON HCL 4 MG/2ML IJ SOLN: 4.0000 mg | Freq: Four times a day (QID) | INTRAMUSCULAR | Status: DC | PRN

## 2015-12-11 MED ORDER — HEPARIN BOLUS VIA INFUSION
3000.0000 [IU] | Freq: Once | INTRAVENOUS | Status: AC
  Administered 2015-12-11: 3000 [IU] via INTRAVENOUS
  Filled 2015-12-11: qty 3000

## 2015-12-11 MED ORDER — HEPARIN (PORCINE) IN NACL 100-0.45 UNIT/ML-% IJ SOLN
1100.0000 [IU]/h | INTRAMUSCULAR | Status: DC
  Administered 2015-12-11: 1000 [IU]/h via INTRAVENOUS
  Filled 2015-12-11 (×2): qty 250

## 2015-12-11 MED ORDER — LEVOFLOXACIN IN D5W 750 MG/150ML IV SOLN
750.0000 mg | INTRAVENOUS | Status: AC
  Administered 2015-12-11 – 2015-12-13 (×2): 750 mg via INTRAVENOUS
  Filled 2015-12-11 (×2): qty 150

## 2015-12-11 MED ORDER — PERFLUTREN LIPID MICROSPHERE
1.0000 mL | INTRAVENOUS | Status: AC | PRN
  Administered 2015-12-11: 4 mL via INTRAVENOUS
  Filled 2015-12-11: qty 10

## 2015-12-11 NOTE — Consult Note (Signed)
WOC received wound care orders from the [facility] wound care center per the husband's request.  Orders updated in computer.    Re consult if needed, will not follow at this time. Thanks  Admir Candelas [employer], CWOCN [phone])

## 2015-12-11 NOTE — Progress Notes (Signed)
  Echocardiogram 2D Echocardiogram has been performed.  [name] 12/11/2015, 5:44 PM

## 2015-12-11 NOTE — Progress Notes (Signed)
ANTICOAGULATION CONSULT NOTE - Initial Consult  Pharmacy Consult for heparin Indication: possible LV thrombus  Allergies  Allergen Reactions  . Acetaminophen Nausea Only and Other (See Comments)    Does not tolerate well, nausea  . Ceftriaxone Itching    Pt has tolerated amoxicillin and cephalexin in the past.  . Codeine Nausea And Vomiting  . Erythromycin Nausea And Vomiting  . Hydrocodone-Acetaminophen Nausea And Vomiting  . Hydromorphone Nausea And Vomiting  . Propoxyphene Hcl Nausea And Vomiting  . Statins Other (See Comments)    Leg myalgias  . Eggs Or Egg-Derived Products Other (See Comments)    "pt does not eat" Unspecified reaction  . Ibuprofen Other (See Comments)    Unspecified  Per Pt "Does not tolerate well"  . Shellfish-Derived Products Other (See Comments)    "pt does not eat" Unspecified reaction    Patient Measurements: Height: 5' 4"  (162.6 cm) Weight: 140 lb 10.5 oz (63.8 kg) IBW/kg (Calculated) : 54.7 Heparin Dosing Weight: 63.5 kg  Vital Signs: Temp: 98.1 F (36.7 C) (06/09 0738) Temp Source: Oral (06/09 0738) BP: 151/64 mmHg (06/09 0903) Pulse Rate: 99 (06/09 0903)  Labs:  Recent Labs  12/09/15 0734 12/09/15 2312 12/10/15 0430 12/11/15 0428  HGB 9.0*  --  6.9*  6.7* 7.8*  HCT 28.0*  --  22.3*  21.6* 24.4*  PLT PLATELET CLUMPS NOTED ON SMEAR, COUNT APPEARS ADEQUATE  --  294  316 257  CREATININE 1.14* 1.13* 1.04*  1.04* 1.03*  TROPONINI 0.07*  --   --   --     Estimated Creatinine Clearance: 43.9 mL/min (by C-G formula based on Cr of 1.03).   Medical History: Past Medical History  Diagnosis Date  . Hypertension     Unspecified  . Hyperlipidemia     Mixed  . Tobacco abuse     Remote  . Loose, teeth     has loose bridge and two loose teeth holding it  . PONV (postoperative nausea and vomiting)   . Type II diabetes mellitus ([place])   . GERD (gastroesophageal reflux disease)   . Anxiety   . Peripheral vascular disease (H[facility])      unspecified, a. s/p L CEA b. s/p B fem-pop bypass c. s/p R BKA  . Raynaud disease   . Stones in the urinary tract   . UTI (urinary tract infection)   . Anemia     hx  . Coronary artery disease     a. s/p NSTEMI 02/2009 - PCI LCX with Xience DES. Otherwise branch vessel and Dist. RCA dzs. NL EF.  b. 2013: cath showing patency of LCx stent and mild nonobstructive dz in LAD; normal LV function  . Myocardial infarction ([place])   . CHF (congestive heart failure) ([place])   . Kidney stones   . UTI (lower urinary tract infection)   . Constipation   . Hernia, umbilical   . History of blood transfusion   . Fall from slipping on wet surface Jan. 7, 2014    Right stump bleeding from fall  . Carotid artery occlusion   . Proliferative diabetic retinopathy of right eye ([place])   . Subclavian steal syndrome   . Diabetic neuropathy with neurologic complication ([place])   . History of right below knee amputation ([place])   . Vitamin D deficiency   . Goiter     Medications:  Scheduled:  . sodium chloride   Intravenous Once  . ALPRAZolam  0.25 mg Oral BID  . antiseptic  oral rinse  7 mL Mouth Rinse QID  . aspirin EC  81 mg Oral Daily  . chlorhexidine gluconate (SAGE KIT)  15 mL Mouth Rinse BID  . collagenase   Topical Daily  . ezetimibe  10 mg Oral Daily  . famotidine (PEPCID) IV  20 mg Intravenous Q24H  . fentaNYL (SUBLIMAZE) injection  50 mcg Intravenous Once  . furosemide  60 mg Intravenous Q8H  . insulin aspart  0-15 Units Subcutaneous Q4H  . insulin glargine  7 Units Subcutaneous QHS  . isosorbide mononitrate  30 mg Oral Daily  . levofloxacin (LEVAQUIN) IV  750 mg Intravenous Q48H  . magnesium sulfate LVP 250-500 ml  4 g Intravenous Once  . metoprolol succinate  50 mg Oral Daily  . omega-3 acid ethyl esters  2 g Oral BID  . pantoprazole  40 mg Oral Daily  . potassium chloride  10 mEq Intravenous Q1 Hr x 4  . [START ON 12/14/2015] rosuvastatin  10 mg Oral Q Mon  . sodium chloride flush  3 mL  Intravenous Q12H    Assessment: 70 yo woman admitted 12/09/2015 for acute respiratory failure now found to have possible LV thrombus   PMH PMH HTN, HLD, DM, GERD, Raynaud's, RVD, nephrolithiasis, anemia, CAD, HFpEF (EF 65-70%), DM neuropathy  Hgb 7.8, plt 257. Will bolus and start heparin gtt, d/c enoxaparin VTE ppx.   Goal of Therapy:  Heparin level 0.3-0.7 units/ml Monitor platelets by anticoagulation protocol: Yes   Plan:  Heparin 3000 units x 1 bolus Heparin 1000 units/h  1800 HL Daily HL, CBC Monitor s/sx bleeding   Heloise Ochoa, Pharm.D., BCPS PGY2 Cardiology Pharmacy Resident Pager: [phone]  12/11/2015,9:13 AM

## 2015-12-11 NOTE — Procedures (Signed)
Extubation Procedure Note  Patient Details:   Name: [name] DOB: [birth year] MRN: [id number]   Airway Documentation:     Evaluation  O2 sats: stable throughout Complications: No apparent complications Patient did tolerate procedure well. Bilateral Breath Sounds: Clear   Yes   Patient extubated to 2L nasal cannula per MD order.  Positive cuff leak noted.  No evidence of stridor.  Patient able to speak post extubation.  Sats currently 96%.  Vitals are stable.  No complications noted.  Ledell Noss N 12/11/2015, 9:04 AM

## 2015-12-11 NOTE — Consult Note (Addendum)
PULMONARY / CRITICAL CARE MEDICINE   Name: [name] MRN: [id number] DOB: [birth year]    ADMISSION DATE:  12/09/2015 CONSULTATION DATE:  6/7  REFERRING MD:  Teresa Coombs TRH  CHIEF COMPLAINT:  SOB  HISTORY OF PRESENT ILLNESS:   70 year old female past medical history as below, which includes severe PVD (s/p s/p R BKA in 2013, L fem-pop bypass in 2009, aortobifemoral bypass in 2013, recent left fem-pop above knee to below-knee popliteal bypass on 08/19/2015), CAD (PCI of LCx in 2010, cath in 2013 showing patency of LCx stent and mild nonobstructive CAD in LAD and normal LV function), DM2, Diastolic CHF, subclavian steal syndrome, and Anemia.   She presented to [facility] emergency department 6/7 with complaints of cough/shortness of breath 24 hours. She describes cough is minimally productive and is unable to describe character sputum. Due to not feeling well, she slept in a different room that her husband the night prior. At 5 AM her husband could hear her from the other room having very labored breathing. When he got to her she was lethargic and very dyspneic. EMS transported patient to [facility] emergency department where she was started on BiPAP for increased work of breathing. Chest x-ray was consistent with pulmonary edema versus pneumonia. She was value by cardiology in the emergency department who recommended low-dose Lasix for diuresis. They felt as though pneumonia may be causing a significant portion of her symptoms as well.  SUBJECTIVE:  964 cc negative balance Low glu 53 treated  VITAL SIGNS: BP 99/42 mmHg  Pulse 81  Temp(Src) 98.6 F (37 C) (Oral)  Resp 24  Ht  (1.626 m)  Wt 63.8 kg (140 lb 10.5 oz)  BMI 24.13 kg/m2  SpO2 98%  HEMODYNAMICS: CVP:  [2 mmHg-10 mmHg] 2 mmHg  VENTILATOR SETTINGS: Vent Mode:  [-] PRVC FiO2 (%):  [40 %] 40 % Set Rate:  [24 bmp] 24 bmp Vt Set:  [440 mL] 440 mL PEEP:  [5 cmH20] 5 cmH20 Pressure Support:  [10 cmH20] 10  cmH20 Plateau Pressure:  [19 cmH20-26 cmH20] 24 cmH20  INTAKE / OUTPUT: I/O last 3 completed shifts: In: 2243.6 [I.V.:1031.6; Blood:260; NG/GT:352; IV Piggyback:600] Out: 2700 [Urine:1825; Emesis/NG output:875]  PHYSICAL EXAMINATION: General:  Chronically ill appearing, calm Neuro:  Awake, FC in bed HEENT:  NCA, jvd present but lower  Cardiovascular:  s1 s2 RRR Lungs: less coarse Abdomen:  BS+, soft, nontender Musculoskeletal:  S/p BKA R leg Skin:  Wound L hip, well dressed  LABS:  BMET  Recent Labs Lab 12/09/15 2312 12/10/15 0430 12/11/15 0428  NA 134* 133*  134* 136  K 5.2* 4.3  4.4 3.2*  CL 102 102  103 102  CO2 BUN 19 21*  22* 18  CREATININE 1.13* 1.04*  1.04* 1.03*  GLUCOSE 167* 133*  133* 123*    Electrolytes  Recent Labs Lab 12/09/15 2312 12/10/15 0430 12/10/15 1644 12/11/15 0428  CALCIUM 8.4* 8.2*  8.3*  --  8.0*  MG 1.5* 1.4*  1.4* 1.8 1.6*  PHOS 4.4 3.6 2.9 2.6    CBC  Recent Labs Lab 12/09/15 0734 12/10/15 0430 12/11/15 0428  WBC 31.5* 9.4  9.9 9.7  HGB 9.0* 6.9*  6.7* 7.8*  HCT 28.0* 22.3*  21.6* 24.4*  PLT PLATELET CLUMPS NOTED ON SMEAR, COUNT APPEARS ADEQUATE 294  316 257    Coag's No results for input(s): APTT, INR in the last 168 hours.  Sepsis Markers  Recent  Labs Lab 12/09/15 0736 12/09/15 2312  LATICACIDVEN 2.34*  --   PROCALCITON  --  6.11    ABG  Recent Labs Lab 12/09/15 1308  PHART 7.364  PCO2ART 43.1  PO2ART 31.0*    Liver Enzymes  Recent Labs Lab 12/09/15 0734 12/10/15 0430  AST 45* 25  ALT 19 20  ALKPHOS 94 76  BILITOT 1.4* 0.7  ALBUMIN 2.5* 2.0*    Cardiac Enzymes  Recent Labs Lab 12/09/15 0734  TROPONINI 0.07*    Glucose  Recent Labs Lab 12/10/15 1529 12/10/15 2022 12/10/15 2058 12/10/15 2308 12/10/15 2354 12/11/15 0305  GLUCAP 78 53* 80 68 115* 73    Imaging No results found.   STUDIES:    CULTURES: 6/7 Blood >  6/7 resp >    ANTIBIOTICS: Vanc 6/7 > 6/9 Cefepime 6/7>6/9 Levaquin 6/7 >    SIGNIFICANT EVENTS: Moved to ICU 6/7 (day of admission) 6/7- intubated 6/8- neg balance 1 liter  LINES/TUBES: 6/7 ETT >  6/7 CVL >  DISCUSSION: 70 y/o female with multiple comorbid illnesses including significant peripheral vascular disease, CAD is here now with acute hypoxemic respiratory failure likely due to HCAP based on her elevated WBC and cough at home.  The ddx includes acute pulmonary edema from decompensated heart failure exacerbation but this seems less likely now considering the progression of her respiratory failure despite documented diuresis today.  Her husband changed her code status after lengthy conversation with Dr. Konrad Dolores this evening.  She is now full code.  ASSESSMENT / PLAN:  PULMONARY A: Acute respiratory failure with hypoxemia HCAP very unlikely with rapid resolution pcxr with lasix Favor edema P:   Neg balance goals, las able Active weanign as goal, cpap 5 ps 5, goal 30 min  Assess rsbi Reduce rate likley on rest  CARDIOVASCULAR A:  CAD Peripheral vascular disease Chronic diastolic heart failure > possible based on Cardiology notes P:  Continue lasix cvp noted 3 from 5, accuracy is an issue as she tolerated lasix so well and resolved her pcxr, dc cvp  RENAL A:   Hyponatremia Hypomag, hyop k  P:   =k, mag, supp kvo lytres again in am  Lasix same dose Dc cvp- not accurate  GASTROINTESTINAL A:   NPO, ileus? P:   Weaning, hold TF Famotidine for GI prophylaxis  HEMATOLOGIC A:   Anemia, leukocytosis P:  Monitor for bleeding scd Lovenox Wbc likely was error 6/7  INFECTIOUS A:   HCAP unlikely Chronic wounds P:   Dc vanc, cef Pct trend Consider monotherapy abx x 5 days, given rapid resolution pcxr  ENDOCRINE A:   DM2 P:   SSI  NEUROLOGIC A:   Sedation needs for vent synchrony P:   RASS goal: 0 Fentanyl gtt - WU Prn versed - dc Xanax prn  home   FAMILY  - Updates: husband by me 6/8, will update him today also  - Inter-disciplinary family meet or Palliative Care meeting due by:  6/14  Ccm time 35 min   Mcarthur Rossetti. Tyson Alias, MD, FACP Pgr: [phone] [facility]  Echo back, lv clot? Will hep, cal cards

## 2015-12-11 NOTE — Progress Notes (Signed)
ANTICOAGULATION CONSULT NOTE   Pharmacy Consult for heparin Indication: possible LV thrombus  Allergies  Allergen Reactions  . Acetaminophen Nausea Only and Other (See Comments)    Does not tolerate well, nausea  . Ceftriaxone Itching    Pt has tolerated amoxicillin and cephalexin in the past.  . Codeine Nausea And Vomiting  . Erythromycin Nausea And Vomiting  . Hydrocodone-Acetaminophen Nausea And Vomiting  . Hydromorphone Nausea And Vomiting  . Propoxyphene Hcl Nausea And Vomiting  . Statins Other (See Comments)    Leg myalgias  . Eggs Or Egg-Derived Products Other (See Comments)    "pt does not eat" Unspecified reaction  . Ibuprofen Other (See Comments)    Unspecified  Per Pt "Does not tolerate well"  . Shellfish-Derived Products Other (See Comments)    "pt does not eat" Unspecified reaction    Patient Measurements: Height: 5\' 4"  (162.6 cm) Weight: 140 lb 10.5 oz (63.8 kg) IBW/kg (Calculated) : 54.7 Heparin Dosing Weight: 63.5 kg  Vital Signs: Temp: 98.6 F (37 C) (06/09 1934) Temp Source: Oral (06/09 1934) BP: 157/54 mmHg (06/09 1900) Pulse Rate: 89 (06/09 1900)  Labs:  Recent Labs  12/09/15 0734 12/09/15 2312 12/10/15 0430 12/11/15 0428 12/11/15 0930 12/11/15 2011  HGB 9.0*  --  6.9*  6.7* 7.8*  --   --   HCT 28.0*  --  22.3*  21.6* 24.4*  --   --   PLT PLATELET CLUMPS NOTED ON SMEAR, COUNT APPEARS ADEQUATE  --  294  316 257  --   --   APTT  --   --   --   --  42*  --   LABPROT  --   --   --   --  19.1*  --   INR  --   --   --   --  1.60*  --   HEPARINUNFRC  --   --   --   --   --  0.31  CREATININE 1.14* 1.13* 1.04*  1.04* 1.03*  --   --   TROPONINI 0.07*  --   --   --   --   --     Estimated Creatinine Clearance: 43.9 mL/min (by C-G formula based on Cr of 1.03).   Medical History: Past Medical History  Diagnosis Date  . Hypertension     Unspecified  . Hyperlipidemia     Mixed  . Tobacco abuse     Remote  . Loose, teeth     has  loose bridge and two loose teeth holding it  . PONV (postoperative nausea and vomiting)   . Type II diabetes mellitus (HCC)   . GERD (gastroesophageal reflux disease)   . Anxiety   . Peripheral vascular disease (HCC)     unspecified, a. s/p L CEA b. s/p B fem-pop bypass c. s/p R BKA  . Raynaud disease   . Stones in the urinary tract   . UTI (urinary tract infection)   . Anemia     hx  . Coronary artery disease     a. s/p NSTEMI 02/2009 - PCI LCX with Xience DES. Otherwise branch vessel and Dist. RCA dzs. NL EF.  b. 2013: cath showing patency of LCx stent and mild nonobstructive dz in LAD; normal LV function  . Myocardial infarction (HCC)   . CHF (congestive heart failure) (HCC)   . Kidney stones   . UTI (lower urinary tract infection)   . Constipation   . Hernia,  umbilical   . History of blood transfusion   . Fall from slipping on wet surface Jan. 7, 2014    Right stump bleeding from fall  . Carotid artery occlusion   . Proliferative diabetic retinopathy of right eye (HCC)   . Subclavian steal syndrome   . Diabetic neuropathy with neurologic complication (HCC)   . History of right below knee amputation (HCC)   . Vitamin D deficiency   . Goiter     Medications:  Scheduled:  . sodium chloride   Intravenous Once  . ALPRAZolam  0.25 mg Oral BID  . aspirin EC  81 mg Oral Daily  . collagenase   Topical Daily  . ezetimibe  10 mg Oral Daily  . famotidine (PEPCID) IV  20 mg Intravenous Q24H  . furosemide  60 mg Intravenous Q8H  . insulin aspart  0-15 Units Subcutaneous Q4H  . insulin glargine  7 Units Subcutaneous QHS  . isosorbide mononitrate  30 mg Oral Daily  . levofloxacin (LEVAQUIN) IV  750 mg Intravenous Q48H  . metoprolol succinate  50 mg Oral Daily  . omega-3 acid ethyl esters  2 g Oral BID  . pantoprazole  40 mg Oral Daily  . [START ON 12/14/2015] rosuvastatin  10 mg Oral Q Mon  . sodium chloride flush  3 mL Intravenous Q12H    Assessment: 70 yo woman admitted  12/09/2015 for acute respiratory failure now found to have possible LV thrombus   PMH PMH HTN, HLD, DM, GERD, Raynaud's, RVD, nephrolithiasis, anemia, CAD, HFpEF (EF 65-70%), DM neuropathy  Initial HL 0.31 on heparin infusion of 1000 units/hr. CBC stable with low hgb on admission. Repeat ECHO this evening found no evidence of thrombus.   Goal of Therapy:  Heparin level 0.3-0.7 units/ml Monitor platelets by anticoagulation protocol: Yes   Plan:  1. Continue Heparin at 1000 units/h  2. Confirmatory heparin level in am along with Daily HL, CBC 3. Monitor s/sx bleeding 4. F/u anticoagulation needs in am    Pollyann Samples, PharmD, BCPS 12/11/2015, 10:45 PM Pager: [phone]

## 2015-12-11 NOTE — Care Management Important Message (Signed)
Important Message  Patient Details  Name: Benna Hasting MRN: [id number] Date of Birth: 10-May-1946   Medicare Important Message Given:  Yes    Kyla Balzarine 12/11/2015, 10:52 AM

## 2015-12-11 NOTE — Progress Notes (Signed)
[facility] ADULT ICU REPLACEMENT PROTOCOL FOR AM LAB REPLACEMENT ONLY  The patient does apply for the [facility] Adult ICU Electrolyte Replacment Protocol based on the criteria listed below:   1. Is GFR >/= 40 ml/min? Yes.    Patient's GFR today is 54 2. Is urine output >/= 0.5 ml/kg/hr for the last 6 hours? Yes.   Patient's UOP is 1.55 ml/kg/hr 3. Is BUN < 60 mg/dL? Yes.    Patient's BUN today is 18 4. Abnormal electrolyte(s): Potassium 5. Ordered repletion with: Potassium per Protocol    Riot Barrick P 12/11/2015 5:53 AM

## 2015-12-11 NOTE — Care Management Note (Addendum)
Case Management Note  Patient Details  Name: [name] MRN: [id number] Date of Birth: [birth year]  Subjective/Objective:    Pt admitted with CHF                Action/Plan:  Pt from home with husband - limited mobility due to RBKA.  Goes for hyperbaric chamber treatments 5x wkly. Next scheduled dressing change on Friday. CM will order PT/OT evaluation as pt is now extubated. Pt uses walker, wheelchair and prosthesis in the home - no home health. Pt does not currently weigh herself daily - will start, adheres to low sodium diet.  Husband denies barriers to discharge back home.     Expected Discharge Date:                  Expected Discharge Plan:  Home w Home Health Services (From home with husband)  In-House Referral:     Discharge planning Services  CM Consult  Post Acute Care Choice:    Choice offered to:     DME Arranged:    DME Agency:     HH Arranged:    HH Agency:     Status of Service:  In process, will continue to follow  Medicare Important Message Given:  Yes Date Medicare IM Given:    Medicare IM give by:    Date Additional Medicare IM Given:    Additional Medicare Important Message give by:     If discussed at Long Length of Stay Meetings, dates discussed:    Additional Comments:  Cherylann Parr, RN 12/11/2015, 3:47 PM

## 2015-12-12 ENCOUNTER — Encounter (HOSPITAL_COMMUNITY): Payer: Self-pay | Admitting: *Deleted

## 2015-12-12 ENCOUNTER — Inpatient Hospital Stay (HOSPITAL_COMMUNITY): Payer: Medicare Other

## 2015-12-12 DIAGNOSIS — J811 Chronic pulmonary edema: Secondary | ICD-10-CM | POA: Insufficient documentation

## 2015-12-12 DIAGNOSIS — E118 Type 2 diabetes mellitus with unspecified complications: Secondary | ICD-10-CM

## 2015-12-12 DIAGNOSIS — J810 Acute pulmonary edema: Secondary | ICD-10-CM

## 2015-12-12 LAB — BASIC METABOLIC PANEL
ANION GAP: 8 (ref 5–15)
BUN: 10 mg/dL (ref 6–20)
CALCIUM: 8.3 mg/dL — AB (ref 8.9–10.3)
CO2: 28 mmol/L (ref 22–32)
CREATININE: 0.77 mg/dL (ref 0.44–1.00)
Chloride: 98 mmol/L — ABNORMAL LOW (ref 101–111)
GLUCOSE: 93 mg/dL (ref 65–99)
Potassium: 3.9 mmol/L (ref 3.5–5.1)
Sodium: 134 mmol/L — ABNORMAL LOW (ref 135–145)

## 2015-12-12 LAB — GLUCOSE, CAPILLARY
GLUCOSE-CAPILLARY: 183 mg/dL — AB (ref 65–99)
GLUCOSE-CAPILLARY: 198 mg/dL — AB (ref 65–99)
GLUCOSE-CAPILLARY: 162 mg/dL — AB (ref 65–99)
Glucose-Capillary: 179 mg/dL — ABNORMAL HIGH (ref 65–99)
Glucose-Capillary: 184 mg/dL — ABNORMAL HIGH (ref 65–99)
Glucose-Capillary: 75 mg/dL (ref 65–99)

## 2015-12-12 LAB — CBC
HEMATOCRIT: 26.9 % — AB (ref 36.0–46.0)
HEMOGLOBIN: 8.5 g/dL — AB (ref 12.0–15.0)
MCH: 25.4 pg — AB (ref 26.0–34.0)
MCHC: 31.6 g/dL (ref 30.0–36.0)
MCV: 80.5 fL (ref 78.0–100.0)
Platelets: 336 10*3/uL (ref 150–400)
RBC: 3.34 MIL/uL — ABNORMAL LOW (ref 3.87–5.11)
RDW: 17.1 % — AB (ref 11.5–15.5)
WBC: 11.5 10*3/uL — ABNORMAL HIGH (ref 4.0–10.5)

## 2015-12-12 LAB — CULTURE, RESPIRATORY

## 2015-12-12 LAB — CULTURE, RESPIRATORY W GRAM STAIN: Culture: NO GROWTH

## 2015-12-12 LAB — PHOSPHORUS: Phosphorus: 2.7 mg/dL (ref 2.5–4.6)

## 2015-12-12 LAB — HEPARIN LEVEL (UNFRACTIONATED): HEPARIN UNFRACTIONATED: 0.27 [IU]/mL — AB (ref 0.30–0.70)

## 2015-12-12 LAB — MAGNESIUM: Magnesium: 2 mg/dL (ref 1.7–2.4)

## 2015-12-12 MED ORDER — SODIUM CHLORIDE 0.9% FLUSH
10.0000 mL | INTRAVENOUS | Status: DC | PRN
  Administered 2015-12-14: 40 mL
  Filled 2015-12-12: qty 40

## 2015-12-12 MED ORDER — FUROSEMIDE 10 MG/ML IJ SOLN
60.0000 mg | Freq: Two times a day (BID) | INTRAMUSCULAR | Status: DC
Start: 2015-12-12 — End: 2015-12-13
  Administered 2015-12-12 – 2015-12-13 (×2): 60 mg via INTRAVENOUS
  Filled 2015-12-12 (×3): qty 6

## 2015-12-12 MED ORDER — POTASSIUM CHLORIDE CRYS ER 20 MEQ PO TBCR: 40.0000 meq | EXTENDED_RELEASE_TABLET | Freq: Two times a day (BID) | ORAL | Status: DC

## 2015-12-12 MED ORDER — FENTANYL CITRATE (PF) 100 MCG/2ML IJ SOLN
25.0000 ug | INTRAMUSCULAR | Status: DC | PRN
  Administered 2015-12-12 (×2): 100 ug via INTRAVENOUS
  Filled 2015-12-12 (×2): qty 2

## 2015-12-12 MED ORDER — HEPARIN BOLUS VIA INFUSION
1000.0000 [IU] | Freq: Once | INTRAVENOUS | Status: DC
  Filled 2015-12-12: qty 1000

## 2015-12-12 MED ORDER — DEXLANSOPRAZOLE 60 MG PO CPDR
60.0000 mg | DELAYED_RELEASE_CAPSULE | Freq: Two times a day (BID) | ORAL | Status: DC
  Administered 2015-12-13 – 2015-12-14 (×3): 60 mg via ORAL
  Filled 2015-12-12 (×3): qty 1

## 2015-12-12 MED ORDER — HYDRALAZINE HCL 10 MG PO TABS
10.0000 mg | ORAL_TABLET | Freq: Three times a day (TID) | ORAL | Status: DC
Start: 2015-12-12 — End: 2015-12-14
  Administered 2015-12-12 – 2015-12-14 (×7): 10 mg via ORAL
  Filled 2015-12-12 (×8): qty 1

## 2015-12-12 MED ORDER — RAMIPRIL 5 MG PO CAPS
5.0000 mg | ORAL_CAPSULE | Freq: Every day | ORAL | Status: DC
  Filled 2015-12-12: qty 1

## 2015-12-12 MED ORDER — FENTANYL BOLUS VIA INFUSION: 12.5000 ug | INTRAVENOUS | Status: DC | PRN

## 2015-12-12 MED ORDER — SODIUM CHLORIDE 0.9% FLUSH
10.0000 mL | Freq: Two times a day (BID) | INTRAVENOUS | Status: DC
  Administered 2015-12-12: 10 mL

## 2015-12-12 MED ORDER — POTASSIUM CHLORIDE CRYS ER 20 MEQ PO TBCR
40.0000 meq | EXTENDED_RELEASE_TABLET | Freq: Two times a day (BID) | ORAL | Status: AC
  Administered 2015-12-12 – 2015-12-13 (×3): 40 meq via ORAL
  Filled 2015-12-12 (×3): qty 2

## 2015-12-12 MED ORDER — RAMIPRIL 5 MG PO CAPS
5.0000 mg | ORAL_CAPSULE | Freq: Every day | ORAL | Status: DC
  Administered 2015-12-13: 1.25 mg via ORAL
  Filled 2015-12-12: qty 1

## 2015-12-12 NOTE — Progress Notes (Signed)
ANTICOAGULATION CONSULT NOTE   Pharmacy Consult for heparin Indication: possible LV thrombus  Allergies  Allergen Reactions  . Acetaminophen Nausea Only and Other (See Comments)    Does not tolerate well, nausea  . Ceftriaxone Itching    Pt has tolerated amoxicillin and cephalexin in the past.  . Codeine Nausea And Vomiting  . Erythromycin Nausea And Vomiting  . Hydrocodone-Acetaminophen Nausea And Vomiting  . Hydromorphone Nausea And Vomiting  . Propoxyphene Hcl Nausea And Vomiting  . Statins Other (See Comments)    Leg myalgias  . Eggs Or Egg-Derived Products Other (See Comments)    "pt does not eat" Unspecified reaction  . Ibuprofen Other (See Comments)    Unspecified  Per Pt "Does not tolerate well"  . Shellfish-Derived Products Other (See Comments)    "pt does not eat" Unspecified reaction    Patient Measurements: Height:  (162.6 cm) Weight: 138 lb 14.2 oz (63 kg) IBW/kg (Calculated) : 54.7 Heparin Dosing Weight: 63.5 kg  Vital Signs: Temp: 98.9 F (37.2 C) (06/10 0317) Temp Source: Oral (06/10 0317) BP: 157/50 mmHg (06/10 0600) Pulse Rate: 98 (06/10 0600)  Labs:  Recent Labs  12/10/15 0430 12/11/15 0428 12/11/15 0930 12/11/15 2011 12/12/15 0353  HGB 6.9*  6.7* 7.8*  --   --  8.5*  HCT 22.3*  21.6* 24.4*  --   --  26.9*  PLT 294  316 257  --   --  336  APTT  --   --  42*  --   --   LABPROT  --   --  19.1*  --   --   INR  --   --  1.60*  --   --   HEPARINUNFRC  --   --   --  0.31 0.27*  CREATININE 1.04*  1.04* 1.03*  --   --  0.77    Estimated Creatinine Clearance: 56.5 mL/min (by C-G formula based on Cr of 0.77).   Medical History: Past Medical History  Diagnosis Date  . Hypertension     Unspecified  . Hyperlipidemia     Mixed  . Tobacco abuse     Remote  . Loose, teeth     has loose bridge and two loose teeth holding it  . PONV (postoperative nausea and vomiting)   . Type II diabetes mellitus (HCC)   . GERD (gastroesophageal  reflux disease)   . Anxiety   . Peripheral vascular disease (HCC)     unspecified, a. s/p L CEA b. s/p B fem-pop bypass c. s/p R BKA  . Raynaud disease   . Stones in the urinary tract   . UTI (urinary tract infection)   . Anemia     hx  . Coronary artery disease     a. s/p NSTEMI 02/2009 - PCI LCX with Xience DES. Otherwise branch vessel and Dist. RCA dzs. NL EF.  b. 2013: cath showing patency of LCx stent and mild nonobstructive dz in LAD; normal LV function  . Myocardial infarction (HCC)   . CHF (congestive heart failure) (HCC)   . Kidney stones   . UTI (lower urinary tract infection)   . Constipation   . Hernia, umbilical   . History of blood transfusion   . Fall from slipping on wet surface Jan. 7, 2014    Right stump bleeding from fall  . Carotid artery occlusion   . Proliferative diabetic retinopathy of right eye (HCC)   . Subclavian steal syndrome   .  Diabetic neuropathy with neurologic complication (HCC)   . History of right below knee amputation (HCC)   . Vitamin D deficiency   . Goiter     Medications:  Scheduled:  . sodium chloride   Intravenous Once  . ALPRAZolam  0.25 mg Oral BID  . aspirin EC  81 mg Oral Daily  . collagenase   Topical Daily  . ezetimibe  10 mg Oral Daily  . famotidine (PEPCID) IV  20 mg Intravenous Q24H  . furosemide  60 mg Intravenous Q8H  . heparin  1,000 Units Intravenous Once  . insulin aspart  0-15 Units Subcutaneous Q4H  . insulin glargine  7 Units Subcutaneous QHS  . isosorbide mononitrate  30 mg Oral Daily  . levofloxacin (LEVAQUIN) IV  750 mg Intravenous Q48H  . metoprolol succinate  50 mg Oral Daily  . omega-3 acid ethyl esters  2 g Oral BID  . pantoprazole  40 mg Oral Daily  . [START ON 12/14/2015] rosuvastatin  10 mg Oral Q Mon  . sodium chloride flush  3 mL Intravenous Q12H    Assessment: 70 yo woman admitted 12/09/2015 for acute respiratory failure now found to have possible LV thrombus. HL slightly subtherapeutic at 0.27 on  heparin infusion of 1000 units/hr. CBC stable with low hgb on admission. Repeat ECHO this evening found no evidence of thrombus.   Goal of Therapy:  Heparin level 0.3-0.7 units/ml Monitor platelets by anticoagulation protocol: Yes   Plan:  1. Heparin 1000 unit bolus * 1 2. Increase Heparin drip to 1100 units/hr 3. Check heparin level in 8 hours along with Daily HL, CBC 4. Monitor s/sx bleeding 5. F/u anticoagulation needs in am    Sherron Monday, PharmD Clinical Pharmacy Resident Pager: [phone] 12/12/2015 7:40 AM

## 2015-12-12 NOTE — Progress Notes (Signed)
PT Cancellation Note  Patient Details Name: [name] MRN: [id number] DOB: 1946-03-10   Cancelled Treatment:    Reason Eval/Treat Not Completed: Patient declined, no reason specified States she is too tired for PT evaluation at this time. Reports she will have her husband bring her prosthetic tomorrow for comprehensive evaluation.  Berton Mount 12/12/2015, 2:31 PM  [facility] [place], [place] [phone]

## 2015-12-12 NOTE — Progress Notes (Signed)
eLink Physician-Brief Progress Note Patient Name: [name] DOB: 11/16/1945 MRN: [id number]   Date of Service  12/12/2015  HPI/Events of Note  C/O of shoulder pain. Has numerous medication allergies  eICU Interventions  PRN fentanyl     Intervention Category Intermediate Interventions: Pain - evaluation and management  DETERDING,ELIZABETH 12/12/2015, 3:26 AM

## 2015-12-12 NOTE — Progress Notes (Addendum)
Subjective:   Extubated. Denies dyspnea. CXR cleared.   Echo EF 55% no LV clot with Definity. SBP  remains high 150-170. Weight down 2 pounds.    Intake/Output Summary (Last 24 hours) at 12/12/15 1308 Last data filed at 12/12/15 1100  Gross per 24 hour  Intake 1562.5 ml  Output   2110 ml  Net -547.5 ml    Current meds: . sodium chloride   Intravenous Once  . ALPRAZolam  0.25 mg Oral BID  . aspirin EC  81 mg Oral Daily  . collagenase   Topical Daily  . ezetimibe  10 mg Oral Daily  . furosemide  60 mg Intravenous Q12H  . hydrALAZINE  10 mg Oral Q8H  . insulin aspart  0-15 Units Subcutaneous Q4H  . insulin glargine  7 Units Subcutaneous QHS  . isosorbide mononitrate  30 mg Oral Daily  . levofloxacin (LEVAQUIN) IV  750 mg Intravenous Q48H  . metoprolol succinate  50 mg Oral Daily  . omega-3 acid ethyl esters  2 g Oral BID  . pantoprazole  40 mg Oral Daily  . potassium chloride  40 mEq Oral BID  . [START ON 12/14/2015] rosuvastatin  10 mg Oral Q Mon  . sodium chloride flush  3 mL Intravenous Q12H   Infusions: . nitroGLYCERIN Stopped (12/09/15 1049)     Objective:  Blood pressure 147/52, pulse 84, temperature 97.9 F (36.6 C), temperature source Oral, resp. rate 23, height  (1.626 m), weight 63 kg (138 lb 14.2 oz), SpO2 94 %. Weight change: -0.8 kg (-1 lb 12.2 oz)  Physical Exam: General:  Lying in bed NAD HEENT: normal Neck: supple. JVP 5 . Carotids 2+ bilat; no bruits. No lymphadenopathy or thryomegaly appreciated. Cor: PMI nondisplaced. Regular with occasional ectopy No rubs, gallops or murmurs. Lungs: basilar crackles. Mild rhonchi  Abdomen: soft, nontender, nondistended. No hepatosplenomegaly. No bruits or masses. Good bowel sounds. Extremities: no cyanosis, clubbing, rash, 1+ edema on left. S/p R BKA  Dressing on left hip Neuro: alert & orientedx3, cranial nerves grossly intact. moves all 4 extremities w/o difficulty. Affect pleasant  Telemetry: NSR with  PACs (viewed personally)  Lab Results: Basic Metabolic Panel:  Recent Labs Lab 12/09/15 0734 12/09/15 2312 12/10/15 0430 12/10/15 1644 12/11/15 0428 12/12/15 0353  NA 136 134* 133*  134*  --  136 134*  K 6.6* 5.2* 4.3  4.4  --  3.2* 3.9  CL 104 102 102  103  --  102 98*  CO2 19* --  24 28  GLUCOSE 151* 167* 133*  133*  --  123* 93  BUN 15 19 21*  22*  --  18 10  CREATININE 1.14* 1.13* 1.04*  1.04*  --  1.03* 0.77  CALCIUM 8.6* 8.4* 8.2*  8.3*  --  8.0* 8.3*  MG  --  1.5* 1.4*  1.4* 1.8 1.6* 2.0  PHOS  --  4.4 3.6 2.9 2.6 2.7   Liver Function Tests:  Recent Labs Lab 12/09/15 0734 12/10/15 0430  AST 45* 25  ALT 19 20  ALKPHOS 94 76  BILITOT 1.4* 0.7  PROT 6.2* 5.1*  ALBUMIN 2.5* 2.0*   No results for input(s): LIPASE, AMYLASE in the last 168 hours. No results for input(s): AMMONIA in the last 168 hours. CBC:  Recent Labs Lab 12/09/15 0734 12/10/15 0430 12/11/15 0428 12/12/15 0353  WBC 31.5* 9.4  9.9 9.7 11.5*  NEUTROABS 29.3* 7.9* 7.5  --  HGB 9.0* 6.9*  6.7* 7.8* 8.5*  HCT 28.0* 22.3*  21.6* 24.4* 26.9*  MCV 80.0 80.5  81.5 80.0 80.5  PLT PLATELET CLUMPS NOTED ON SMEAR, COUNT APPEARS ADEQUATE 294  316 257 336   Cardiac Enzymes:  Recent Labs Lab 12/09/15 0734  TROPONINI 0.07*   BNP: Invalid input(s): POCBNP CBG:  Recent Labs Lab 12/11/15 1931 12/11/15 2330 12/12/15 0315 12/12/15 0737 12/12/15 1151  GLUCAP 193* 179* 75 184* 183*   Microbiology: Lab Results  Component Value Date   CULT NO GROWTH 2 DAYS 12/10/2015   CULT NO GROWTH 2 DAYS 12/09/2015   CULT NO GROWTH 5 DAYS 06/05/2015   CULT NO GROWTH 5 DAYS 06/05/2015   CULT NO GROWTH Performed at [facility]  08/19/2014    Recent Labs Lab 12/09/15 2312 12/10/15 1551  CULT NO GROWTH 2 DAYS NO GROWTH 2 DAYS  SDES BLOOD CENTRAL LINE TRACHEAL ASPIRATE    Imaging: Dg Chest Port 1 View  12/12/2015  CLINICAL DATA:  70 year old female with a history  of pneumonia and central line placement. EXAM: PORTABLE CHEST 1 VIEW COMPARISON:  12/11/2015, 12/10/2015 FINDINGS: Cardiomediastinal silhouette unchanged. Calcifications of the aortic arch. Interval extubation.  Interval removal of gastric tube. Unchanged left IJ approach central venous catheter which terminates in the superior vena cava. Hyperexpansion with flattened hemidiaphragms. Bilateral reticulonodular opacity. No pneumothorax. No large pleural effusion. IMPRESSION: Interval extubation and removal of gastric tube, with unchanged left IJ approach central line. Similar pattern of reticulonodular opacity bilateral lungs, which is nonspecific and may reflect atypical pneumonia and/or pneumonitis. Changes of emphysema Signed, Yvone Neu. Loreta Ave, DO [facility] Electronically Signed   By: Gilmer Mor D.O.   On: 12/12/2015 08:37   Dg Chest Port 1 View  12/11/2015  CLINICAL DATA:  Hypoxia EXAM: PORTABLE CHEST 1 VIEW COMPARISON:  December 10, 2015 FINDINGS: Endotracheal tube tip is 5.9 cm above the carina. Central catheter tip is in the superior vena cava. Nasogastric tube tip and side port are in the stomach. No pneumothorax. There is consolidation in the left lower lobe. Lungs elsewhere clear. Heart is borderline enlarged with pulmonary vascularity within normal limits. No adenopathy. There is atherosclerotic calcification in aorta. There is calcification within the left shoulder, stable. IMPRESSION: Tube and catheter positions as described without pneumothorax. Left lower lobe airspace consolidation. There has been interval clearing of pulmonary edema compared to 1 day prior. No new opacity evident. Stable cardiac prominence. Calcific tendinosis left shoulder. Electronically Signed   By: Bretta Bang III M.D.   On: 12/11/2015 07:16     ASSESSMENT:  1. Acute respiratory failure 2. Acute on chronic diastolic HF 3. CAD   --cath in 2013 showing patency  of LCx stent and mild nonobstructive CAD in LAD 4. HTN crisis with HF 5. PAD s/p R BKA  6. Symptomatic anemia 7. Hypomagnesemia 8. Non-healing wound on left hip being treated with hyperbaric therapy  PLAN/DISCUSSION:  Suspect major issue was diastolic HF in setting of HTN crisis.   Volume status much better. Continue lasix 60 daily (was on 40 daily at home).   Respiratory status much better. Echo images reviewed an EF normal. No LV clot. Now off heparin.   BP high. Will add back ramipril. Would be nice to get off hydralazine if possible as was not on at home.   No evidence ischemia as trigger but can consider outpatient Myoview at some point.   Electrolytes improved.  LOS: 3 days    Kaylee Meres, MD 12/12/2015, 1:08 PM

## 2015-12-12 NOTE — Progress Notes (Signed)
PULMONARY / CRITICAL CARE MEDICINE   Name: [name] MRN: [id number] DOB: Feb 20, 1946    ADMISSION DATE:  12/09/2015 CONSULTATION DATE:  6/7  REFERRING MD:  Teresa Coombs TRH  CHIEF COMPLAINT:  SOB  BRIEF:   70 y/o female with PAD and CAD admitted to the ICU on 6/7 with respiratory failure due to pulmonary edema.   SUBJECTIVE:  Extubated Doing well eating  VITAL SIGNS: BP 163/58 mmHg  Pulse 101  Temp(Src) 98.6 F (37 C) (Oral)  Resp 16  Ht  (1.626 m)  Wt 63 kg (138 lb 14.2 oz)  BMI 23.83 kg/m2  SpO2 94%  HEMODYNAMICS:    VENTILATOR SETTINGS:    INTAKE / OUTPUT: I/O last 3 completed shifts: In: 2940.9 [P.O.:450; I.V.:1876.2; NG/GT:114.7; IV Piggyback:500] Out: 4320 [Urine:4195; Emesis/NG output:125]  PHYSICAL EXAMINATION: General:  No distress HENT: NCAT OP clear PULM: CTA B ant/lat, normal effort CV: Few irregular beats, RRR otherwise GI: BS+, soft, nontender MSK: s/p BKA Neuro: A&Ox4, maew  LABS:  BMET  Recent Labs Lab 12/10/15 0430 12/11/15 0428 12/12/15 0353  NA 133*  134* 136 134*  K 4.3  4.4 3.2* 3.9  CL 102  103 102 98*  CO2 BUN 21*  22* 18 10  CREATININE 1.04*  1.04* 1.03* 0.77  GLUCOSE 133*  133* 123* 93    Electrolytes  Recent Labs Lab 12/10/15 0430 12/10/15 1644 12/11/15 0428 12/12/15 0353  CALCIUM 8.2*  8.3*  --  8.0* 8.3*  MG 1.4*  1.4* 1.8 1.6* 2.0  PHOS 3.6 2.9 2.6 2.7    CBC  Recent Labs Lab 12/10/15 0430 12/11/15 0428 12/12/15 0353  WBC 9.4  9.9 9.7 11.5*  HGB 6.9*  6.7* 7.8* 8.5*  HCT 22.3*  21.6* 24.4* 26.9*  PLT 294  316 257 336    Coag's  Recent Labs Lab 12/11/15 0930  APTT 42*  INR 1.60*    Sepsis Markers  Recent Labs Lab 12/09/15 0736 12/09/15 2312  LATICACIDVEN 2.34*  --   PROCALCITON  --  6.11    ABG  Recent Labs Lab 12/09/15 1308  PHART 7.364  PCO2ART 43.1  PO2ART 31.0*    Liver Enzymes  Recent Labs Lab 12/09/15 0734 12/10/15 0430   AST 45* 25  ALT 19 20  ALKPHOS 94 76  BILITOT 1.4* 0.7  ALBUMIN 2.5* 2.0*    Cardiac Enzymes  Recent Labs Lab 12/09/15 0734  TROPONINI 0.07*    Glucose  Recent Labs Lab 12/11/15 0734 12/11/15 1112 12/11/15 1542 12/11/15 1931 12/11/15 2330 12/12/15 0315  GLUCAP 121* 181* 116* 193* 179* 75    Imaging No results found.   STUDIES:    CULTURES: 6/7 Blood >  6/7 resp >   ANTIBIOTICS: Vanc 6/7 > 6/9 Cefepime 6/7>6/9 Levaquin 6/7 >    SIGNIFICANT EVENTS: Moved to ICU 6/7 (day of admission) 6/7- intubated 6/8- neg balance 1 liter  LINES/TUBES: 6/7 ETT > 6/8 6/7 CVL >  DISCUSSION: 70 y/o female with multiple comorbid illnesses including significant peripheral vascular disease, CAD is here now with acute hypoxemic respiratory failure likely due to acute pulmonary edema vs less likely HCAP.   ASSESSMENT / PLAN:  PULMONARY A: Acute respiratory failure with hypoxemia > improved HCAP? P:   Continue lasix Active weanign as goal, cpap 5 ps 5, goal 30 min  Assess rsbi Reduce rate likley on rest  CARDIOVASCULAR A:  CAD Peripheral vascular disease Acute on chronic diastolic heart  failure > improving P:  Continue lasix > q12 Add back home Imdur and add hydralazine  RENAL A:   Hyponatremia  P:   Monitor BMET and UOP Replace electrolytes as needed   GASTROINTESTINAL A:   NO acute issues P:   Clear liquid diet  HEMATOLOGIC A:   Anemia, leukocytosis P:  Monitor for bleeding scd Stop heparin, no evidence of LV clot  INFECTIOUS A:   HCAP unlikely Chronic wounds P:   Levaquin x5 days  ENDOCRINE A:   DM2 P:   SSI  NEUROLOGIC A:   Neck pain P:   Fentanyl prn   FAMILY  - Updates: husband by me 6/9 by Theotis Barrio family meet or Palliative Care meeting due by:  6/14  Heber Iglesia [place], MD [place] [facility] Pager: [phone] Cell: [phone] After 3pm or if no response, call [phone]

## 2015-12-13 DIAGNOSIS — I10 Essential (primary) hypertension: Secondary | ICD-10-CM

## 2015-12-13 DIAGNOSIS — I96 Gangrene, not elsewhere classified: Secondary | ICD-10-CM

## 2015-12-13 LAB — GLUCOSE, CAPILLARY
GLUCOSE-CAPILLARY: 133 mg/dL — AB (ref 65–99)
GLUCOSE-CAPILLARY: 144 mg/dL — AB (ref 65–99)
GLUCOSE-CAPILLARY: 164 mg/dL — AB (ref 65–99)
GLUCOSE-CAPILLARY: 169 mg/dL — AB (ref 65–99)
GLUCOSE-CAPILLARY: 262 mg/dL — AB (ref 65–99)
Glucose-Capillary: 229 mg/dL — ABNORMAL HIGH (ref 65–99)

## 2015-12-13 LAB — CBC
HCT: 27.9 % — ABNORMAL LOW (ref 36.0–46.0)
HEMOGLOBIN: 8.8 g/dL — AB (ref 12.0–15.0)
MCH: 26 pg (ref 26.0–34.0)
MCHC: 31.5 g/dL (ref 30.0–36.0)
MCV: 82.5 fL (ref 78.0–100.0)
PLATELETS: 323 10*3/uL (ref 150–400)
RBC: 3.38 MIL/uL — AB (ref 3.87–5.11)
RDW: 17 % — ABNORMAL HIGH (ref 11.5–15.5)
WBC: 11.2 10*3/uL — AB (ref 4.0–10.5)

## 2015-12-13 LAB — BASIC METABOLIC PANEL
ANION GAP: 9 (ref 5–15)
BUN: 11 mg/dL (ref 6–20)
CHLORIDE: 100 mmol/L — AB (ref 101–111)
CO2: 27 mmol/L (ref 22–32)
Calcium: 9.1 mg/dL (ref 8.9–10.3)
Creatinine, Ser: 0.83 mg/dL (ref 0.44–1.00)
Glucose, Bld: 150 mg/dL — ABNORMAL HIGH (ref 65–99)
POTASSIUM: 4.2 mmol/L (ref 3.5–5.1)
SODIUM: 136 mmol/L (ref 135–145)

## 2015-12-13 MED ORDER — INSULIN ASPART 100 UNIT/ML ~~LOC~~ SOLN
0.0000 [IU] | Freq: Three times a day (TID) | SUBCUTANEOUS | Status: DC
  Administered 2015-12-13: 3 [IU] via SUBCUTANEOUS
  Administered 2015-12-13: 8 [IU] via SUBCUTANEOUS
  Administered 2015-12-14 (×2): 3 [IU] via SUBCUTANEOUS

## 2015-12-13 MED ORDER — INSULIN ASPART 100 UNIT/ML ~~LOC~~ SOLN: 0.0000 [IU] | Freq: Every day | SUBCUTANEOUS | Status: DC

## 2015-12-13 MED ORDER — RAMIPRIL 10 MG PO CAPS
10.0000 mg | ORAL_CAPSULE | Freq: Every day | ORAL | Status: DC
  Filled 2015-12-13 (×3): qty 1

## 2015-12-13 MED ORDER — FUROSEMIDE 40 MG PO TABS
60.0000 mg | ORAL_TABLET | Freq: Every day | ORAL | Status: DC
  Administered 2015-12-14: 60 mg via ORAL
  Filled 2015-12-13: qty 1

## 2015-12-13 NOTE — Progress Notes (Signed)
PROGRESS NOTE  [name] ZOX:[id number] DOB: July 27, 1945 DOA: 12/09/2015 PCP: Julian Hy, MD  Brief History:  70 year old female with a history of HTN, HLD, DM, GERD, Raynaud's disease, PVD (s/p s/p R BKA in 2013, L fem-pop bypass in 2009, aortobifemoral bypass in 2013, recent left fem-pop above knee to below-knee popliteal bypass on 08/19/2015), nephrolithiasis, anemia, CAD, MI, CHF, diabetic neuropathy, presented 12/09/2015 with shortness of breath and altered mental status. The patient woke up with shortness of breath on the day of admission.  Upon presentation, the patient was in respiratory distress, and she was placed on BiPAP in the emergency department.  The patient's respiratory status continued to deteriorate and was subsequently intubated and admitted to the intensive care unit. The patient was started on intravenous furosemide with good clinical response. There is also concern for HCAP at the time of presentation with procalcitonin 6.11 and chest x-ray findings suggestive of bilateral nodular opacities. The patient was started on cefepime and levofloxacin. The patient was noted to have a hemoglobin of 6.7. She was transfused 1 unit PRBC although the patient presented with WBC 31.5, this was felt to be likely lab error given rapid resolution to 9.4 on the subsequent day.  Assessment/Plan: Acute respiratory failure with hypoxia -Secondary to pulmonary edema -Continue intravenous furosemide -Patient was extubated on 12/11/2015 -Presently stable on 2 L nasal cannula--oxygen saturation 90% -Wean for SPO2 greater than 92%  Acute on chronic diastolic CHF -Appreciate cardiology follow-up -Decompensation in part due to symptomatic anemia and HTN crisis -Continue intravenous furosemide--dosing per cardiology -Daily weights--question accuracy as the patient is -9 pounds in the last 24 hours -Appears to have a dry weight approximately 140 -Accurate I's and O's-NEG 2.3 L  further admission -CVP readings were not felt to be correct in ICU--CVP discontinued -Continue hydralazine, Imdur -Continue metoprolol succinate and ramipril  Hyponatremia -Secondary to CHF -Improved with intravenous furosemide  Microcytic anemia -Transfuse 1 unit PRBC--hemoglobin has remained stable -Check iron/TIBC, ferritin -check B12, RBC folate  Peripheral vascular disease -s/p s/p R BKA in 2013, L fem-pop bypass in 2009, aortobifemoral bypass in 2013, recent left fem-pop above knee to below-knee popliteal bypass on 08/19/2015)  Chronic Left hip wound -Secondary to peripheral vascular disease -Per wound care center instructions--using Santyl on the lateral leg ulcer and to keep the Theraskin site (steristrip in place) intact and only change the foam topper. To be changed daily. Dry dressing to the toes -Family has been resistant to wound care nurse recommendations -Patient presently refuses for the wound to be examined  CAD -cath in 2013 showing patency of LCx stent and mild nonobstructive CAD in LAD -continue aspirin 81 mg daily  Diabetes mellitus type 2 -08/19/2015 hemoglobin A1c 6.6 -CBG is largely controlled -Continue Lantus 7 units at bedtime -Continue NovoLog sliding scale -recheck A1C  HTN -Continue ramipril, metoprolol succinate   Pyuria -Unfortunately, urine culture not sent -Patient would've had been treated with at least 5 days of antibiotics -Will not continue further antibodies at this time     Disposition Plan:   Home 6/12 or 6/13 when cleared by cardiology Family Communication:  No Family at beside  Consultants:  Cardiology, PCCM  Code Status:  FULL    Subjective: Patient denies fevers, chills, headache, chest pain, dyspnea, nausea, vomiting, diarrhea, abdominal pain, dysuria, hematuria   Objective: Filed Vitals:   12/13/15 0100 12/13/15 0200 12/13/15 0338 12/13/15 0420  BP: 161/54 158/81 174/65 162/51  Pulse: 88  92 89   Temp:    97.8 F (36.6 C)   TempSrc:   Oral   Resp: 20 22 19    Height:      Weight:   58.7 kg (129 lb 6.6 oz)   SpO2: 100% 100% 100%     Intake/Output Summary (Last 24 hours) at 12/13/15 0640 Last data filed at 12/13/15 0345  Gross per 24 hour  Intake    280 ml  Output    975 ml  Net   -695 ml   Weight change: -4.3 kg (-9 lb 7.7 oz) Exam:   General:  Pt is alert, follows commands appropriately, not in acute distress  HEENT: No icterus, No thrush, No neck mass, [place]/AT  Cardiovascular: RRR, S1/S2, no rubs, no gallops  Respiratory: Fine bibasilar crackles. No wheeze   Abdomen: Soft/+BS, non tender, non distended, no guarding  Extremities: trace LE edema, No lymphangitis, No petechiae, No rashes, no synovitis; dry gangrene left second and third toes; right BKA with open wounds   Data Reviewed: I have personally reviewed following labs and imaging studies Basic Metabolic Panel:  Recent Labs Lab 12/09/15 2312 12/10/15 0430 12/10/15 1644 12/11/15 0428 12/12/15 0353 12/13/15 0436  NA 134* 133*  134*  --  136 134* 136  K 5.2* 4.3  4.4  --  3.2* 3.9 4.2  CL 102 102  103  --  102 98* 100*  CO2 23 23  23   --  24 28 27   GLUCOSE 167* 133*  133*  --  123* 93 150*  BUN 19 21*  22*  --  18 10 11   CREATININE 1.13* 1.04*  1.04*  --  1.03* 0.77 0.83  CALCIUM 8.4* 8.2*  8.3*  --  8.0* 8.3* 9.1  MG 1.5* 1.4*  1.4* 1.8 1.6* 2.0  --   PHOS 4.4 3.6 2.9 2.6 2.7  --    Liver Function Tests:  Recent Labs Lab 12/09/15 0734 12/10/15 0430  AST 45* 25  ALT 19 20  ALKPHOS 94 76  BILITOT 1.4* 0.7  PROT 6.2* 5.1*  ALBUMIN 2.5* 2.0*   No results for input(s): LIPASE, AMYLASE in the last 168 hours. No results for input(s): AMMONIA in the last 168 hours. Coagulation Profile:  Recent Labs Lab 12/11/15 0930  INR 1.60*   CBC:  Recent Labs Lab 12/09/15 0734 12/10/15 0430 12/11/15 0428 12/12/15 0353 12/13/15 0436  WBC 31.5* 9.4  9.9 9.7 11.5* 11.2*  NEUTROABS 29.3* 7.9*  7.5  --   --   HGB 9.0* 6.9*  6.7* 7.8* 8.5* 8.8*  HCT 28.0* 22.3*  21.6* 24.4* 26.9* 27.9*  MCV 80.0 80.5  81.5 80.0 80.5 82.5  PLT PLATELET CLUMPS NOTED ON SMEAR, COUNT APPEARS ADEQUATE 294  316 257 336 323   Cardiac Enzymes:  Recent Labs Lab 12/09/15 0734  TROPONINI 0.07*   BNP: Invalid input(s): POCBNP CBG:  Recent Labs Lab 12/12/15 1151 12/12/15 1621 12/12/15 2100 12/12/15 2342 12/13/15 0350  GLUCAP 183* 198* 162* 164* 144*   HbA1C: No results for input(s): HGBA1C in the last 72 hours. Urine analysis:    Component Value Date/Time   COLORURINE YELLOW 12/09/2015 2215   APPEARANCEUR CLOUDY* 12/09/2015 2215   LABSPEC 1.014 12/09/2015 2215   PHURINE 5.0 12/09/2015 2215   GLUCOSEU NEGATIVE 12/09/2015 2215   HGBUR SMALL* 12/09/2015 2215   BILIRUBINUR NEGATIVE 12/09/2015 2215   KETONESUR NEGATIVE 12/09/2015 2215   PROTEINUR NEGATIVE 12/09/2015 2215   UROBILINOGEN 4.0* 08/19/2014 0021  NITRITE NEGATIVE 12/09/2015 2215   LEUKOCYTESUR MODERATE* 12/09/2015 2215   Sepsis Labs: (procalcitonin:4,lacticidven:4) ) Recent Results (from the past 240 hour(s))  MRSA PCR Screening     Status: None   Collection Time: 12/09/15  5:00 PM  Result Value Ref Range Status   MRSA by PCR NEGATIVE NEGATIVE Final    Comment:        The GeneXpert MRSA Assay (FDA approved for NASAL specimens only), is one component of a comprehensive MRSA colonization surveillance program. It is not intended to diagnose MRSA infection nor to guide or monitor treatment for MRSA infections.   Culture, blood (routine x 2) Call MD if unable to obtain prior to antibiotics being given     Status: None (Preliminary result)   Collection Time: 12/09/15 11:12 PM  Result Value Ref Range Status   Specimen Description BLOOD CENTRAL LINE  Final   Special Requests BOTTLES DRAWN AEROBIC AND ANAEROBIC  Final   Culture NO GROWTH 3 DAYS  Final   Report Status PENDING  Incomplete  Culture,  respiratory (NON-Expectorated)     Status: None   Collection Time: 12/10/15  3:51 PM  Result Value Ref Range Status   Specimen Description TRACHEAL ASPIRATE  Final   Special Requests NONE  Final   Gram Stain   Final    ABUNDANT WBC PRESENT, PREDOMINANTLY MONONUCLEAR NO ORGANISMS SEEN    Culture NO GROWTH 2 DAYS  Final   Report Status 12/12/2015 FINAL  Final     Scheduled Meds: . sodium chloride   Intravenous Once  . ALPRAZolam  0.25 mg Oral BID  . aspirin EC  81 mg Oral Daily  . collagenase   Topical Daily  . dexlansoprazole  60 mg Oral BID  . ezetimibe  10 mg Oral Daily  . furosemide  60 mg Intravenous Q12H  . hydrALAZINE  10 mg Oral Q8H  . insulin aspart  0-15 Units Subcutaneous Q4H  . insulin glargine  7 Units Subcutaneous QHS  . isosorbide mononitrate  30 mg Oral Daily  . [COMPLETED] levofloxacin (LEVAQUIN) IV  750 mg Intravenous Q48H  . metoprolol succinate  50 mg Oral Daily  . omega-3 acid ethyl esters  2 g Oral BID  . potassium chloride  40 mEq Oral BID  . ramipril  5 mg Oral Daily  . [START ON 12/14/2015] rosuvastatin  10 mg Oral Q Mon  . sodium chloride flush  10-40 mL Intracatheter Q12H  . sodium chloride flush  3 mL Intravenous Q12H   Continuous Infusions: . nitroGLYCERIN Stopped (12/09/15 1049)    Procedures/Studies: Dg Chest Port 1 View  12/12/2015  CLINICAL DATA:  70 year old female with a history of pneumonia and central line placement. EXAM: PORTABLE CHEST 1 VIEW COMPARISON:  12/11/2015, 12/10/2015 FINDINGS: Cardiomediastinal silhouette unchanged. Calcifications of the aortic arch. Interval extubation.  Interval removal of gastric tube. Unchanged left IJ approach central venous catheter which terminates in the superior vena cava. Hyperexpansion with flattened hemidiaphragms. Bilateral reticulonodular opacity. No pneumothorax. No large pleural effusion. IMPRESSION: Interval extubation and removal of gastric tube, with unchanged left IJ approach central line.  Similar pattern of reticulonodular opacity bilateral lungs, which is nonspecific and may reflect atypical pneumonia and/or pneumonitis. Changes of emphysema Signed, Yvone Neu. Loreta Ave, DO [facility] Electronically Signed   By: Gilmer Mor D.O.   On: 12/12/2015 08:37   Dg Chest Port 1 View  12/11/2015  CLINICAL DATA:  Hypoxia EXAM: PORTABLE CHEST 1 VIEW  COMPARISON:  December 10, 2015 FINDINGS: Endotracheal tube tip is 5.9 cm above the carina. Central catheter tip is in the superior vena cava. Nasogastric tube tip and side port are in the stomach. No pneumothorax. There is consolidation in the left lower lobe. Lungs elsewhere clear. Heart is borderline enlarged with pulmonary vascularity within normal limits. No adenopathy. There is atherosclerotic calcification in aorta. There is calcification within the left shoulder, stable. IMPRESSION: Tube and catheter positions as described without pneumothorax. Left lower lobe airspace consolidation. There has been interval clearing of pulmonary edema compared to 1 day prior. No new opacity evident. Stable cardiac prominence. Calcific tendinosis left shoulder. Electronically Signed   By: Bretta Bang III M.D.   On: 12/11/2015 07:16   Dg Chest Port 1 View  12/10/2015  CLINICAL DATA:  Sepsis, shortness of breath. EXAM: PORTABLE CHEST 1 VIEW COMPARISON:  Portable chest x-ray of December 09, 2015 FINDINGS: The lungs remain hyperinflated. The interstitial markings remain increased diffusely and have become more confluent. The retrocardiac region is more dense. There is no significant pleural effusion. The cardiac silhouette is mildly enlarged. The pulmonary vascularity remains engorged. The endotracheal tube tip lies 3.7 cm above the carina. The esophagogastric tube tip projects below the inferior margin of the image. The left internal jugular venous catheter tip projects over the proximal SVC. IMPRESSION: Persistent  interstitial and alveolar opacities bilaterally worrisome for pneumonia though there may be an element of pulmonary edema. Increasing retrocardiac and right lower lung opacities. The support tubes are in reasonable position. Electronically Signed   By: Rameses Ou  [place] M.D.   On: 12/10/2015 07:52   Portable Chest Xray  12/09/2015  CLINICAL DATA:  Status post line placement EXAM: PORTABLE CHEST 1 VIEW COMPARISON:  12/09/2015 FINDINGS: A a left jugular central line is now seen in the mid superior vena cava. No pneumothorax is noted. An endotracheal tube and nasogastric catheter are noted in satisfactory position. Bilateral patchy opacities are again identified somewhat more consolidated than that seen on the prior exam. This may represent some mild improvement. No bony abnormality is seen. Cardiac shadow is stable. IMPRESSION: Tubes and lines as described above. Some more consolidated infiltrates are noted in perihilar distribution bilaterally. Electronically Signed   By: Alcide Clever M.D.   On: 12/09/2015 22:48   Dg Chest Port 1 View  12/09/2015  CLINICAL DATA:  Shortness of breath for 1 day EXAM: PORTABLE CHEST 1 VIEW COMPARISON:  Study obtained earlier in the day FINDINGS: There has been progression of interstitial and alveolar edema diffusely. Heart is enlarged with pulmonary venous hypertension. Atherosclerotic calcification is noted in the aorta. No adenopathy. IMPRESSION: Evidence of progression of congestive heart failure. There may be a degree of superimposed ARDS. Electronically Signed   By: Bretta Bang III M.D.   On: 12/09/2015 19:17   Dg Chest Portable 1 View  12/09/2015  CLINICAL DATA:  Shortness of breath since 5 a.m.; history of CHF and coronary artery disease with angioplasty and stent placement, peripheral vascular disease, diabetes ; former heavy smoker. EXAM: PORTABLE CHEST 1 VIEW COMPARISON:  Portable chest x-ray of June 05, 2015 FINDINGS: The lungs remain hyperinflated. The  interstitial markings are increased diffusely. The cardiac silhouette is mildly enlarged. The central pulmonary vascularity is prominent. There is no pleural effusion or pneumothorax. The trachea is midline. IMPRESSION: COPD with superimposed CHF. Atelectasis or developing pneumonia in the lower lobes bilaterally is present. There is no pleural effusion or pneumothorax. Electronically Signed   By:  Ashiah Karpowicz  [place] M.D.   On: 12/09/2015 07:36   Dg Abd Portable 1v  12/09/2015  CLINICAL DATA:  Status post nasogastric catheter placement EXAM: PORTABLE ABDOMEN - 1 VIEW COMPARISON:  None. FINDINGS: Nasogastric catheter is noted coiled within the stomach. Fecal material is noted throughout the colon. No free air is seen. IMPRESSION: Nasogastric catheter within the stomach. Electronically Signed   By: Alcide Clever M.D.   On: 12/09/2015 22:48    Clariece Roesler, DO  [facility] Pager [phone]  If 7PM-7AM, please contact night-coverage [email or web address] Password [email or web address] 12/13/2015, 6:40 AM   LOS: 4 days

## 2015-12-13 NOTE — Progress Notes (Signed)
PT Cancellation Note  Patient Details Name: [name] MRN: [id number] DOB: 01-02-1946   Cancelled Treatment:    Reason Eval/Treat Not Completed: Patient declined, no reason specified. Attempted to see pt x2.  Pt's husband has brought prosthesis but left it in the car. Does not wish to bring prosthesis into hospital until the pt has transferred to the floor.  Per RN this should occur around lunchtime.  Will return to see pt this afternoon, time permitting.  Encarnacion Chu PT, DPT  Pager: [phone] Phone: [phone] 12/13/2015, 11:01 AM

## 2015-12-13 NOTE — Evaluation (Signed)
Physical Therapy Evaluation Patient Details Name: [name] MRN: [id number] DOB: 03-Oct-1945 Today's Date: 12/13/2015   History of Present Illness  Pt is a 70 y/o F who presented w/ SOB and AMS.  She was placed on BiPAP in the ED and subsequently intubated.  Extubated 12/11/15.  Pt's PMH includes recent Lt fem-pop bypass on 08/19/15, Rt BKA in 2013, Lt fem-pop bypass 2009, aortobifemoral bypass in 2013, wound clinic 5 days/wk for hyperbaric chamber treatment for her chronic Lt hip wound and dry gangrenous toes, MI, anemia, goiter, breat lumpectomy, hernia repair.    Clinical Impression  Pt admitted with above diagnosis. Pt currently with functional limitations due to the deficits listed below (see PT Problem List). PTA pt using WC in home and uses RW only when ambulating car<>home.  She currently requires up to min assist to ambulate short distances.  Her husband is available to continue providing 24/7 assist/supervision. Pt will benefit from skilled PT to increase their independence and safety with mobility to allow discharge to the venue listed below.      Follow Up Recommendations Home health PT;Supervision for mobility/OOB    Equipment Recommendations  None recommended by PT    Recommendations for Other Services OT consult     Precautions / Restrictions Precautions Precautions: Fall Required Braces or Orthoses: Other Brace/Splint Other Brace/Splint: Rt BKA, has prosthesis in room Restrictions Weight Bearing Restrictions: No      Mobility  Bed Mobility Overal bed mobility: Needs Assistance Bed Mobility: Supine to Sit     Supine to sit: Min guard;HOB elevated     General bed mobility comments: Uses bed rail w/ increased time.   Transfers Overall transfer level: Needs assistance Equipment used: Rolling walker (2 wheeled) Transfers: Sit to/from Stand Sit to Stand: Min guard         General transfer comment: Min guard due to instability.  Cues for hand placement.  No  physical assist needed.  Ambulation/Gait Ambulation/Gait assistance: Min assist Ambulation Distance (Feet): 20 Feet Assistive device: Rolling walker (2 wheeled) Gait Pattern/deviations: Step-through pattern;Decreased stride length   Gait velocity interpretation: Below normal speed for age/gender General Gait Details: Flexed posture which doesn't improve w/ cues.  LOB x1 w/ min assist to steady, otherwise close min guard assist.    Stairs            Wheelchair Mobility    Modified Rankin (Stroke Patients Only)       Balance Overall balance assessment: Needs assistance Sitting-balance support: No upper extremity supported;Feet supported Sitting balance-Leahy Scale: Fair Sitting balance - Comments: Pt able to sit EOB and don prothesis w/ assist only to push sleeve on and assist reaching Lt foot to put on shoe.   Standing balance support: Bilateral upper extremity supported;During functional activity Standing balance-Leahy Scale: Poor Standing balance comment: Relies on UE support                             Pertinent Vitals/Pain Pain Assessment: No/denies pain    Home Living Family/patient expects to be discharged to:: Private residence Living Arrangements: Spouse/significant other Available Help at Discharge: Family;Available 24 hours/day Type of Home: House Home Access: Stairs to enter Entrance Stairs-Rails: Right;Left;Can reach both Entrance Stairs-Number of Steps: 2 Home Layout: One level Home Equipment: Shower seat;Bedside commode;Walker - 2 wheels;Wheelchair - manual      Prior Function Level of Independence: Needs assistance   Gait / Transfers Assistance Needed: Did not do  much walking.  Only with walker and prosthetic from garage steps to car with help from husband.  Otherwise was pivot transfers only which she sometimes had help with and sometimes would not.    ADL's / Homemaking Assistance Needed: pt husband does most cleaning and cooking but  pt can care for self other than transferring to shower.        Hand Dominance   Dominant Hand: Right    Extremity/Trunk Assessment   Upper Extremity Assessment: Overall WFL for tasks assessed           Lower Extremity Assessment: Overall WFL for tasks assessed      Cervical / Trunk Assessment: Normal  Communication   Communication: No difficulties  Cognition Arousal/Alertness: Awake/alert Behavior During Therapy: Flat affect Overall Cognitive Status: Within Functional Limits for tasks assessed                      General Comments General comments (skin integrity, edema, etc.): Wound Lt thigh w/ dressing    Exercises        Assessment/Plan    PT Assessment Patient needs continued PT services  PT Diagnosis Difficulty walking   PT Problem List Decreased activity tolerance;Decreased balance;Decreased safety awareness;Cardiopulmonary status limiting activity;Pain;Decreased skin integrity  PT Treatment Interventions DME instruction;Gait training;Stair training;Functional mobility training;Therapeutic activities;Balance training;Therapeutic exercise;Patient/family education   PT Goals (Current goals can be found in the Care Plan section) Acute Rehab PT Goals Patient Stated Goal: to go home PT Goal Formulation: With patient/family Time For Goal Achievement: 12/27/15 Potential to Achieve Goals: Good    Frequency Min 3X/week   Barriers to discharge        Co-evaluation               End of Session Equipment Utilized During Treatment: Gait belt;Oxygen Activity Tolerance: Patient limited by fatigue Patient left: in chair;with call bell/phone within reach;with chair alarm set Nurse Communication: Mobility status         Time: 1601-0932 PT Time Calculation (min) (ACUTE ONLY): 30 min   Charges:   PT Evaluation $PT Eval Moderate Complexity: 1 Procedure PT Treatments $Therapeutic Activity: 8-22 mins   PT G Codes:       Encarnacion Chu PT,  DPT  Pager: [phone] Phone: [phone] 12/13/2015, 3:03 PM

## 2015-12-13 NOTE — Progress Notes (Addendum)
Subjective:   Feels better. Diuresed well. Weight recorded as down 9 pounds - not sure if accurate. Remains on IV lasix. SBP improved with restarting ramipril.   Echo EF 55% no LV clot with Definity. SBP  remains high 150-170. Weight down 2 pounds.    Intake/Output Summary (Last 24 hours) at 12/13/15 1507 Last data filed at 12/13/15 1100  Gross per 24 hour  Intake    150 ml  Output   2575 ml  Net  -2425 ml    Current meds: . sodium chloride   Intravenous Once  . ALPRAZolam  0.25 mg Oral BID  . aspirin EC  81 mg Oral Daily  . collagenase   Topical Daily  . dexlansoprazole  60 mg Oral BID  . ezetimibe  10 mg Oral Daily  . furosemide  60 mg Intravenous Q12H  . hydrALAZINE  10 mg Oral Q8H  . insulin aspart  0-15 Units Subcutaneous TID WC  . insulin aspart  0-5 Units Subcutaneous QHS  . insulin glargine  7 Units Subcutaneous QHS  . isosorbide mononitrate  30 mg Oral Daily  . metoprolol succinate  50 mg Oral Daily  . omega-3 acid ethyl esters  2 g Oral BID  . ramipril  5 mg Oral Daily  . [START ON 12/14/2015] rosuvastatin  10 mg Oral Q Mon  . sodium chloride flush  10-40 mL Intracatheter Q12H  . sodium chloride flush  3 mL Intravenous Q12H   Infusions: . nitroGLYCERIN Stopped (12/09/15 1049)     Objective:  Blood pressure 142/65, pulse 41, temperature 97.6 F (36.4 C), temperature source Oral, resp. rate 20, height 5\' 4"  (1.626 m), weight 58.7 kg (129 lb 6.6 oz), SpO2 99 %. Weight change: -4.3 kg (-9 lb 7.7 oz)  Physical Exam: General:  Lying in bed NAD HEENT: normal Neck: supple. JVP 5 . Carotids 2+ bilat; no bruits. No lymphadenopathy or thryomegaly appreciated. Cor: PMI nondisplaced. Regular with occasional ectopy No rubs, gallops or murmurs. Lungs: decreased breath sounds no wheezing Abdomen: soft, nontender, nondistended. No hepatosplenomegaly. No bruits or masses. Good bowel sounds. Extremities: no cyanosis, clubbing, rash, trace edema on left. S/p R BKA   Dressing on left hip Neuro: alert & orientedx3, cranial nerves grossly intact. moves all 4 extremities w/o difficulty. Affect pleasant  Telemetry: NSR with PACs (viewed personally)  Lab Results: Basic Metabolic Panel:  Recent Labs Lab 12/09/15 2312 12/10/15 0430 12/10/15 1644 12/11/15 0428 12/12/15 0353 12/13/15 0436  NA 134* 133*  134*  --  136 134* 136  K 5.2* 4.3  4.4  --  3.2* 3.9 4.2  CL 102 102  103  --  102 98* 100*  CO2 23 23  23   --  24 28 27   GLUCOSE 167* 133*  133*  --  123* 93 150*  BUN 19 21*  22*  --  18 10 11   CREATININE 1.13* 1.04*  1.04*  --  1.03* 0.77 0.83  CALCIUM 8.4* 8.2*  8.3*  --  8.0* 8.3* 9.1  MG 1.5* 1.4*  1.4* 1.8 1.6* 2.0  --   PHOS 4.4 3.6 2.9 2.6 2.7  --    Liver Function Tests:  Recent Labs Lab 12/09/15 0734 12/10/15 0430  AST 45* 25  ALT 19 20  ALKPHOS 94 76  BILITOT 1.4* 0.7  PROT 6.2* 5.1*  ALBUMIN 2.5* 2.0*   No results for input(s): LIPASE, AMYLASE in the last 168 hours. No results for input(s): AMMONIA in the  last 168 hours. CBC:  Recent Labs Lab 12/09/15 0734 12/10/15 0430 12/11/15 0428 12/12/15 0353 12/13/15 0436  WBC 31.5* 9.4  9.9 9.7 11.5* 11.2*  NEUTROABS 29.3* 7.9* 7.5  --   --   HGB 9.0* 6.9*  6.7* 7.8* 8.5* 8.8*  HCT 28.0* 22.3*  21.6* 24.4* 26.9* 27.9*  MCV 80.0 80.5  81.5 80.0 80.5 82.5  PLT PLATELET CLUMPS NOTED ON SMEAR, COUNT APPEARS ADEQUATE 294  316 257 336 323   Cardiac Enzymes:  Recent Labs Lab 12/09/15 0734  TROPONINI 0.07*   BNP: Invalid input(s): POCBNP CBG:  Recent Labs Lab 12/12/15 2100 12/12/15 2342 12/13/15 0350 12/13/15 0741 12/13/15 1140  GLUCAP 162* 164* 144* 133* 262*   Microbiology: Lab Results  Component Value Date   CULT NO GROWTH 2 DAYS 12/10/2015   CULT NO GROWTH 4 DAYS 12/09/2015   CULT NO GROWTH 5 DAYS 06/05/2015   CULT NO GROWTH 5 DAYS 06/05/2015   CULT NO GROWTH Performed at [facility]  08/19/2014    Recent Labs Lab  12/09/15 2312 12/10/15 1551  CULT NO GROWTH 4 DAYS NO GROWTH 2 DAYS  SDES BLOOD CENTRAL LINE TRACHEAL ASPIRATE    Imaging: Dg Chest Port 1 View  12/12/2015  CLINICAL DATA:  70 year old female with a history of pneumonia and central line placement. EXAM: PORTABLE CHEST 1 VIEW COMPARISON:  12/11/2015, 12/10/2015 FINDINGS: Cardiomediastinal silhouette unchanged. Calcifications of the aortic arch. Interval extubation.  Interval removal of gastric tube. Unchanged left IJ approach central venous catheter which terminates in the superior vena cava. Hyperexpansion with flattened hemidiaphragms. Bilateral reticulonodular opacity. No pneumothorax. No large pleural effusion. IMPRESSION: Interval extubation and removal of gastric tube, with unchanged left IJ approach central line. Similar pattern of reticulonodular opacity bilateral lungs, which is nonspecific and may reflect atypical pneumonia and/or pneumonitis. Changes of emphysema Signed, Yvone Neu. Loreta Ave, DO [facility] Electronically Signed   By: Gilmer Mor D.O.   On: 12/12/2015 08:37     ASSESSMENT:  1. Acute respiratory failure 2. Acute on chronic diastolic HF 3. CAD   --cath in 2013 showing patency of LCx stent and mild nonobstructive CAD in LAD 4. HTN crisis with HF 5. PAD s/p R BKA  6. Symptomatic anemia 7. Hypomagnesemia 8. Non-healing wound on left hip being treated with hyperbaric therapy  PLAN/DISCUSSION:  Suspect major issue was diastolic HF in setting of HTN crisis.   Volume status much better. Switch to po lasix 60 daily (was on 40 daily at home).   Respiratory status much better. Echo images reviewed an EF normal. No LV clot. Now off heparin.   BP improved but still high. Will increase ramipril. Would be nice to get off hydralazine if possible as was not on at home. If BP remains high consider renal artery u/s to look for RAS. (Ab CT from 08/2014 reviewed but no  mention of high-grade RAS).   No evidence ischemia as trigger but can consider outpatient Myoview at some point.   Electrolytes ok    LOS: 4 days    Arvilla Meres, MD 12/13/2015, 3:07 PM

## 2015-12-13 NOTE — Progress Notes (Signed)
OT Cancellation Note  Patient Details Name: [name] MRN: [id number] DOB: Nov 12, 1945   Cancelled Treatment:    Reason Eval/Treat Not Completed: Other (comment). Pt just recently moved rooms this PM, husband waiting until she transferred to bring in prothesis--husband nor prothesis in room and pt just starting to eat her lunch. Will re-attempt eval tomorrow.  Evette Georges [phone] 12/13/2015, 1:16 PM

## 2015-12-14 LAB — CULTURE, BLOOD (ROUTINE X 2): Culture: NO GROWTH

## 2015-12-14 LAB — CBC
HCT: 28.3 % — ABNORMAL LOW (ref 36.0–46.0)
HEMOGLOBIN: 8.9 g/dL — AB (ref 12.0–15.0)
MCH: 25.9 pg — ABNORMAL LOW (ref 26.0–34.0)
MCHC: 31.4 g/dL (ref 30.0–36.0)
MCV: 82.3 fL (ref 78.0–100.0)
Platelets: 353 10*3/uL (ref 150–400)
RBC: 3.44 MIL/uL — ABNORMAL LOW (ref 3.87–5.11)
RDW: 17.1 % — AB (ref 11.5–15.5)
WBC: 11.3 10*3/uL — ABNORMAL HIGH (ref 4.0–10.5)

## 2015-12-14 LAB — BASIC METABOLIC PANEL
ANION GAP: 10 (ref 5–15)
BUN: 18 mg/dL (ref 6–20)
CHLORIDE: 101 mmol/L (ref 101–111)
CO2: 26 mmol/L (ref 22–32)
Calcium: 8.8 mg/dL — ABNORMAL LOW (ref 8.9–10.3)
Creatinine, Ser: 0.89 mg/dL (ref 0.44–1.00)
GFR calc Af Amer: >60 mL/min (ref >60)
GFR calc non Af Amer: >60 mL/min (ref >60)
GLUCOSE: 192 mg/dL — AB (ref 65–99)
POTASSIUM: 3.4 mmol/L — AB (ref 3.5–5.1)
Sodium: 137 mmol/L (ref 135–145)

## 2015-12-14 LAB — VITAMIN B12: Vitamin B-12: 192 pg/mL (ref 180–914)

## 2015-12-14 LAB — IRON AND TIBC
IRON: 36 ug/dL (ref 28–170)
Saturation Ratios: 18 % (ref 10.4–31.8)
TIBC: 195 ug/dL — AB (ref 250–450)
UIBC: 159 ug/dL

## 2015-12-14 LAB — FERRITIN: Ferritin: 111 ng/mL (ref 11–307)

## 2015-12-14 LAB — GLUCOSE, CAPILLARY: Glucose-Capillary: 191 mg/dL — ABNORMAL HIGH (ref 65–99)

## 2015-12-14 MED ORDER — RAMIPRIL 1.25 MG PO CAPS
1.2500 mg | ORAL_CAPSULE | Freq: Once | ORAL | Status: AC
  Administered 2015-12-14: 1.25 mg via ORAL
  Filled 2015-12-14: qty 1

## 2015-12-14 MED ORDER — HYDRALAZINE HCL 25 MG PO TABS: 25.0000 mg | ORAL_TABLET | Freq: Three times a day (TID) | ORAL | Status: DC

## 2015-12-14 MED ORDER — METOPROLOL SUCCINATE ER 50 MG PO TB24: 50.0000 mg | ORAL_TABLET | Freq: Every day | ORAL | Status: AC

## 2015-12-14 MED ORDER — FUROSEMIDE 20 MG PO TABS: 60.0000 mg | ORAL_TABLET | Freq: Every day | ORAL | Status: DC

## 2015-12-14 MED ORDER — ALTACE 1.25 MG PO CAPS: 2.5000 mg | ORAL_CAPSULE | Freq: Every day | ORAL | Status: DC

## 2015-12-14 MED ORDER — POTASSIUM CHLORIDE CRYS ER 20 MEQ PO TBCR
40.0000 meq | EXTENDED_RELEASE_TABLET | Freq: Once | ORAL | Status: AC
  Administered 2015-12-14: 40 meq via ORAL
  Filled 2015-12-14: qty 2

## 2015-12-14 MED ORDER — POTASSIUM CHLORIDE CRYS ER 20 MEQ PO TBCR: 20.0000 meq | EXTENDED_RELEASE_TABLET | Freq: Every day | ORAL | Status: DC

## 2015-12-14 NOTE — Progress Notes (Addendum)
Inpatient Diabetes Program Recommendations  AACE/ADA: New Consensus Statement on Inpatient Glycemic Control (2015)  Target Ranges:  Prepandial:   less than 140 mg/dL      Peak postprandial:   less than 180 mg/dL (1-2 hours)      Critically ill patients:  140 - 180 mg/dL  Results for [name], [name] (MRN [id number]) as of 12/14/2015 11:54  Ref. Range 12/13/2015 07:41 12/13/2015 11:40 12/13/2015 16:09 12/13/2015 22:43 12/14/2015 06:25  Glucose-Capillary Latest Ref Range: 65-99 mg/dL 225 (H) 834 (H) 621 (H) 229 (H) 191 (H)     Review of Glycemic Control  Diabetes history: DM 2 Outpatient Diabetes medications: Actos-metformin 15-850 mg 1 tablet daily + Tresiba 14 units Daily +Januvia 100 mg Daily Current orders for Inpatient glycemic control: Lantus 7 units + Novolog correction 0-15 units tid + 0-5 units hs  Inpatient Diabetes Program Recommendations:  Please consider increase in Lantus insulin to 10 units daily and addition of Novolog 3 units meal coverage tid (hold if eats < 50%) with decrease in Novolog correction to sensitive 0-9 units tid.  Thank you, Billy Fischer. Westyn Driggers, RN, MSN, CDE Inpatient Glycemic Control Team Team Pager [phone] (8am-5pm) 12/14/2015 11:58 AM

## 2015-12-14 NOTE — Progress Notes (Signed)
OT Cancellation Note  Patient Details Name: Geneal Faranda MRN: [id number] DOB: [birth year]   Cancelled Treatment:    Reason Eval/Treat Not Completed:  (OT screened-Pt reports no OT concerns)  Earlie Raveling OTR/L [phone] 12/14/2015, 4:22 PM

## 2015-12-14 NOTE — Consult Note (Addendum)
WOC re-consult requested for wound care orders.  Please refer to previous progress notes on 6/8 and 6/9.  Skin substitute graft was applied at the outpatient wound center and should not be disturbed.  Husband at the bedside is well-informed regarding plan of care and plans to obtain follow-up with the wound center upon discharge.  Reviewed orders in the EMR for topical treatment provided daily by the bedside nurses and provided additional clarification.  Husband states he agrees with the verbiage and denies further questions at this time. Please re-consult if further assistance is needed.  Thank-you,  Cammie Mcgee MSN, RN, CWOCN, [place], CNS [phone]

## 2015-12-14 NOTE — Discharge Summary (Signed)
Physician Discharge Summary  [name] GPQ:[id number] DOB: [birth year] DOA: 12/09/2015  PCP: Sheela Stack, MD  Admit date: 12/09/2015 Discharge date: 12/14/2015  Admitted From: home Disposition:  home  Recommendations for Outpatient Follow-up:  1. Follow up with PCP in 1-2 weeks 2. Please obtain BMP in one week  Home Health:yes Equipment/Devices:PT Discharge Condition:stable CODE STATUS:FULL Diet recommendation: Heart Healthy  Brief/Interim Summary 70 year old female with a history of HTN, HLD, DM, GERD, Raynaud's disease, PVD (s/p s/p R BKA in 2013, L fem-pop bypass in 2009, aortobifemoral bypass in 2013, recent left fem-pop above knee to below-knee popliteal bypass on 08/19/2015), nephrolithiasis, anemia, CAD, MI, CHF, diabetic neuropathy, presented 12/09/2015 with shortness of breath and altered mental status. The patient woke up with shortness of breath on the day of admission. Upon presentation, the patient was in respiratory distress, and she was placed on BiPAP in the emergency department. The patient's respiratory status continued to deteriorate and was subsequently intubated and admitted to the intensive care unit. The patient was started on intravenous furosemide with good clinical response. There is also concern for HCAP at the time of presentation with procalcitonin 6.11 and chest x-ray findings suggestive of bilateral nodular opacities. The patient was started on cefepime and levofloxacin. The patient was noted to have a hemoglobin of 6.7. She was transfused 1 unit PRBC although the patient presented with WBC 31.5, this was felt to be likely lab error given rapid resolution to 9.4 on the subsequent day. Nevertheless, the patient's hemoglobin remained stable for the rest of the hospitalization. She remained hemodynamically stable. The patient was transitioned to oral furosemide. She diuresed 4.6 L. After discussion with cardiology, the patient was cleared for discharge.  She will go home with furosemide 60 mg daily and ramipril 2.5 mg daily. The patient's husband understands to contact wound care center for continued follow-up regarding her left hip  wound.  Discharge Diagnoses:  Acute respiratory failure with hypoxia -Secondary to pulmonary edema -Continue intravenous furosemide -Patient was extubated on 12/11/2015 -Presently stable on RA--oxygen saturation 100%  Acute on chronic diastolic CHF -Appreciate cardiology follow-up -Decompensation in part due to symptomatic anemia and HTN crisis -Continue intravenous furosemide--dosing per cardiology -Daily weights -discharge weight 127 lbs -Accurate I's and O's-NEG 4.6 L further admission -CVP readings were not felt to be correct in ICU--CVP discontinued -Continue hydralazine 25 mg tid, Imdur 30 mg daily -Continue metoprolol succinate 50 mg daily and ramipril 2.5 mg at time of d/c  Hyponatremia -Secondary to CHF -Improved with intravenous furosemide  Microcytic anemia -Transfuse 1 unit PRBC--hemoglobin has remained stable -Check iron/TIBC, ferritin--iron saturation 18%, ferritin 111 -check B12--192, RBC folate--pending  Peripheral vascular disease -s/p s/p R BKA in 2013, L fem-pop bypass in 2009, aortobifemoral bypass in 2013, recent left fem-pop above knee to below-knee popliteal bypass on 08/19/2015)  Chronic Left hip wound -Secondary to peripheral vascular disease -Per wound care center instructions--using Santyl on the lateral leg ulcer and to keep the Theraskin site (steristrip in place) intact and only change the foam topper. To be changed daily. Dry dressing to the toes -Family has been resistant to wound care nurse recommendations -Patient presently refuses for the wound to be examined  CAD -cath in 2013 showing patency of LCx stent and mild nonobstructive CAD in LAD -continue aspirin 81 mg daily  Diabetes mellitus type 2 -08/19/2015 hemoglobin A1c 6.6 -CBG is largely  controlled -Continue Lantus 7 units at bedtime-->home with [facility] home dose -Continue NovoLog sliding scale -recheck A1C--pending at the time  of discharge  HTN -Continue ramipril, metoprolol succinate--dosing as discussed above  Pyuria -Unfortunately, urine culture not sent -Patient would've had been treated with at least 5 days of antibiotics -Will not continue further antibotics at this time   Discharge Instructions      Discharge Instructions    Diet - low sodium heart healthy    Complete by:  As directed      Increase activity slowly    Complete by:  As directed             Medication List    STOP taking these medications        prochlorperazine 10 MG tablet  Commonly known as:  COMPAZINE     sulfamethoxazole-trimethoprim 800-160 MG tablet  Commonly known as:  BACTRIM DS,SEPTRA DS      TAKE these medications        ACIDOPHILUS PO  Take 1 capsule by mouth daily.     ALPRAZolam 0.25 MG tablet  Commonly known as:  XANAX  Take 0.25 mg by mouth 2 (two) times daily.     ALTACE 1.25 MG capsule  Generic drug:  ramipril  Take 2 capsules (2.5 mg total) by mouth daily. BRAND NAME ONLY     aspirin EC 81 MG tablet  Take 1 tablet (81 mg total) by mouth daily.     calcium carbonate 500 MG chewable tablet  Commonly known as:  TUMS - dosed in mg elemental calcium  Chew 1,000 mg by mouth daily as needed for indigestion or heartburn.     DEXILANT 60 MG capsule  Generic drug:  dexlansoprazole  Take 60 mg by mouth 2 (two) times daily.     ergocalciferol 50000 units capsule  Commonly known as:  VITAMIN D2  Take 50,000 Units by mouth 3 (three) times a week.     ezetimibe 10 MG tablet  Commonly known as:  ZETIA  TAKE 1 TABLET BY MOUTH EVERY DAY     furosemide 20 MG tablet  Commonly known as:  LASIX  Take 3 tablets (60 mg total) by mouth daily.     hydrALAZINE 25 MG tablet  Commonly known as:  APRESOLINE  Take 1 tablet (25 mg total) by mouth every 8 (eight)  hours.     iron polysaccharides 150 MG capsule  Commonly known as:  NIFEREX  Take 450 mg by mouth 2 (two) times daily.     isosorbide mononitrate 30 MG 24 hr tablet  Commonly known as:  IMDUR  Take 30 mg by mouth daily.     meperidine 50 MG tablet  Commonly known as:  DEMEROL  Take 1-2 tablets (50-100 mg total) by mouth every 4 (four) hours as needed for severe pain.     metoprolol succinate 50 MG 24 hr tablet  Commonly known as:  TOPROL-XL  Take 1 tablet (50 mg total) by mouth daily.     nitroGLYCERIN 0.4 MG SL tablet  Commonly known as:  NITROSTAT  Place 1 tablet (0.4 mg total) under the tongue every 5 (five) minutes as needed. For chest pain     omega-3 acid ethyl esters 1 g capsule  Commonly known as:  LOVAZA  Take 2 g by mouth 2 (two) times daily.     pioglitazone-metformin 15-850 MG tablet  Commonly known as:  ACTOPLUS MET  Take 1 tablet by mouth daily. Brand name only-does not work if components are given separately per patient 15-850 MG     potassium chloride 10 MEQ  tablet  Commonly known as:  K-DUR  Take 1 tablet (10 mEq total) by mouth 2 (two) times daily.     rosuvastatin 10 MG tablet  Commonly known as:  CRESTOR  Take 10 mg by mouth every Monday.     sitaGLIPtin 100 MG tablet  Commonly known as:  JANUVIA  Take 100 mg by mouth at bedtime.     sodium chloride 0.65 % Soln nasal spray  Commonly known as:  OCEAN  Place 2-4 sprays into both nostrils every 4 (four) hours as needed (for nose bleeds).     TRESIBA FLEXTOUCH 100 UNIT/ML Sopn  Generic drug:  Insulin Degludec  Inject 14 Units into the skin daily.       Follow-up Information    Follow up with Richardson Dopp, PA-C On 12/22/2015.   Specialties:  Cardiology, Physician Assistant   Why:  See provider at 10:30 am, please arrive 15 minutes early for paperwork.   Contact information:   [address] [place] [address] [phone]      Allergies  Allergen Reactions  .  Acetaminophen Nausea Only and Other (See Comments)    Does not tolerate well, nausea  . Ceftriaxone Itching    Pt has tolerated amoxicillin and cephalexin in the past.  . Codeine Nausea And Vomiting  . Erythromycin Nausea And Vomiting  . Hydrocodone-Acetaminophen Nausea And Vomiting  . Hydromorphone Nausea And Vomiting  . Propoxyphene Hcl Nausea And Vomiting  . Statins Other (See Comments)    Leg myalgias  . Eggs Or Egg-Derived Products Other (See Comments)    "pt does not eat" Unspecified reaction  . Ibuprofen Other (See Comments)    Unspecified  Per Pt "Does not tolerate well"  . Shellfish-Derived Products Other (See Comments)    "pt does not eat" Unspecified reaction    Consultations:  Cardiology  PCCM   Procedures/Studies: Dg Chest Port 1 View  12/12/2015  CLINICAL DATA:  70 year old female with a history of pneumonia and central line placement. EXAM: PORTABLE CHEST 1 VIEW COMPARISON:  12/11/2015, 12/10/2015 FINDINGS: Cardiomediastinal silhouette unchanged. Calcifications of the aortic arch. Interval extubation.  Interval removal of gastric tube. Unchanged left IJ approach central venous catheter which terminates in the superior vena cava. Hyperexpansion with flattened hemidiaphragms. Bilateral reticulonodular opacity. No pneumothorax. No large pleural effusion. IMPRESSION: Interval extubation and removal of gastric tube, with unchanged left IJ approach central line. Similar pattern of reticulonodular opacity bilateral lungs, which is nonspecific and may reflect atypical pneumonia and/or pneumonitis. Changes of emphysema Signed, Dulcy Fanny. Earleen Newport, DO [facility] Electronically Signed   By: Corrie Mckusick D.O.   On: 12/12/2015 08:37   Dg Chest Port 1 View  12/11/2015  CLINICAL DATA:  Hypoxia EXAM: PORTABLE CHEST 1 VIEW COMPARISON:  December 10, 2015 FINDINGS: Endotracheal tube tip is 5.9 cm above the carina. Central catheter tip  is in the superior vena cava. Nasogastric tube tip and side port are in the stomach. No pneumothorax. There is consolidation in the left lower lobe. Lungs elsewhere clear. Heart is borderline enlarged with pulmonary vascularity within normal limits. No adenopathy. There is atherosclerotic calcification in aorta. There is calcification within the left shoulder, stable. IMPRESSION: Tube and catheter positions as described without pneumothorax. Left lower lobe airspace consolidation. There has been interval clearing of pulmonary edema compared to 1 day prior. No new opacity evident. Stable cardiac prominence. Calcific tendinosis left shoulder. Electronically Signed   By: Lowella Grip  III M.D.   On: 12/11/2015 07:16   Dg Chest Port 1 View  12/10/2015  CLINICAL DATA:  Sepsis, shortness of breath. EXAM: PORTABLE CHEST 1 VIEW COMPARISON:  Portable chest x-ray of December 09, 2015 FINDINGS: The lungs remain hyperinflated. The interstitial markings remain increased diffusely and have become more confluent. The retrocardiac region is more dense. There is no significant pleural effusion. The cardiac silhouette is mildly enlarged. The pulmonary vascularity remains engorged. The endotracheal tube tip lies 3.7 cm above the carina. The esophagogastric tube tip projects below the inferior margin of the image. The left internal jugular venous catheter tip projects over the proximal SVC. IMPRESSION: Persistent interstitial and alveolar opacities bilaterally worrisome for pneumonia though there may be an element of pulmonary edema. Increasing retrocardiac and right lower lung opacities. The support tubes are in reasonable position. Electronically Signed   By: Felicite Zeimet  [place] M.D.   On: 12/10/2015 07:52   Portable Chest Xray  12/09/2015  CLINICAL DATA:  Status post line placement EXAM: PORTABLE CHEST 1 VIEW COMPARISON:  12/09/2015 FINDINGS: A a left jugular central line is now seen in the mid superior vena cava. No pneumothorax is  noted. An endotracheal tube and nasogastric catheter are noted in satisfactory position. Bilateral patchy opacities are again identified somewhat more consolidated than that seen on the prior exam. This may represent some mild improvement. No bony abnormality is seen. Cardiac shadow is stable. IMPRESSION: Tubes and lines as described above. Some more consolidated infiltrates are noted in perihilar distribution bilaterally. Electronically Signed   By: Inez Catalina M.D.   On: 12/09/2015 22:48   Dg Chest Port 1 View  12/09/2015  CLINICAL DATA:  Shortness of breath for 1 day EXAM: PORTABLE CHEST 1 VIEW COMPARISON:  Study obtained earlier in the day FINDINGS: There has been progression of interstitial and alveolar edema diffusely. Heart is enlarged with pulmonary venous hypertension. Atherosclerotic calcification is noted in the aorta. No adenopathy. IMPRESSION: Evidence of progression of congestive heart failure. There may be a degree of superimposed ARDS. Electronically Signed   By: Lowella Grip III M.D.   On: 12/09/2015 19:17   Dg Chest Portable 1 View  12/09/2015  CLINICAL DATA:  Shortness of breath since 5 a.m.; history of CHF and coronary artery disease with angioplasty and stent placement, peripheral vascular disease, diabetes ; former heavy smoker. EXAM: PORTABLE CHEST 1 VIEW COMPARISON:  Portable chest x-ray of June 05, 2015 FINDINGS: The lungs remain hyperinflated. The interstitial markings are increased diffusely. The cardiac silhouette is mildly enlarged. The central pulmonary vascularity is prominent. There is no pleural effusion or pneumothorax. The trachea is midline. IMPRESSION: COPD with superimposed CHF. Atelectasis or developing pneumonia in the lower lobes bilaterally is present. There is no pleural effusion or pneumothorax. Electronically Signed   By: Anvi Mangal  [place] M.D.   On: 12/09/2015 07:36   Dg Abd Portable 1v  12/09/2015  CLINICAL DATA:  Status post nasogastric catheter placement  EXAM: PORTABLE ABDOMEN - 1 VIEW COMPARISON:  None. FINDINGS: Nasogastric catheter is noted coiled within the stomach. Fecal material is noted throughout the colon. No free air is seen. IMPRESSION: Nasogastric catheter within the stomach. Electronically Signed   By: Inez Catalina M.D.   On: 12/09/2015 22:48      Subjective:   Discharge Exam: Filed Vitals:   12/14/15 0439 12/14/15 1215  BP: 161/47 154/53  Pulse: 90 99  Temp: 98.5 F (36.9 C) 97.6 F (36.4 C)  Resp: 18 18   Filed  Vitals:   12/13/15 2247 12/14/15 0104 12/14/15 0439 12/14/15 1215  BP: 163/75 167/67 161/47 154/53  Pulse: 89 86 90 99  Temp: 98.4 F (36.9 C) 98.9 F (37.2 C) 98.5 F (36.9 C) 97.6 F (36.4 C)  TempSrc: Oral Oral Oral Oral  Resp: 18 19 18 18   Height:      Weight:   57.743 kg (127 lb 4.8 oz)   SpO2: 99% 100% 100%     General: Pt is alert, awake, not in acute distress Cardiovascular: RRR, S1/S2 +, no rubs, no gallops Respiratory: CTA bilaterally, no wheezing, no rhonchi Abdominal: Soft, NT, ND, bowel sounds + Extremities: no edema, no cyanosis   The results of significant diagnostics from this hospitalization (including imaging, microbiology, ancillary and laboratory) are listed below for reference.    Significant Diagnostic Studies: Dg Chest Port 1 View  12/12/2015  CLINICAL DATA:  70 year old female with a history of pneumonia and central line placement. EXAM: PORTABLE CHEST 1 VIEW COMPARISON:  12/11/2015, 12/10/2015 FINDINGS: Cardiomediastinal silhouette unchanged. Calcifications of the aortic arch. Interval extubation.  Interval removal of gastric tube. Unchanged left IJ approach central venous catheter which terminates in the superior vena cava. Hyperexpansion with flattened hemidiaphragms. Bilateral reticulonodular opacity. No pneumothorax. No large pleural effusion. IMPRESSION: Interval extubation and removal of gastric tube, with unchanged left IJ approach central line. Similar pattern of  reticulonodular opacity bilateral lungs, which is nonspecific and may reflect atypical pneumonia and/or pneumonitis. Changes of emphysema Signed, Dulcy Fanny. Earleen Newport, DO [facility] Electronically Signed   By: Corrie Mckusick D.O.   On: 12/12/2015 08:37   Dg Chest Port 1 View  12/11/2015  CLINICAL DATA:  Hypoxia EXAM: PORTABLE CHEST 1 VIEW COMPARISON:  December 10, 2015 FINDINGS: Endotracheal tube tip is 5.9 cm above the carina. Central catheter tip is in the superior vena cava. Nasogastric tube tip and side port are in the stomach. No pneumothorax. There is consolidation in the left lower lobe. Lungs elsewhere clear. Heart is borderline enlarged with pulmonary vascularity within normal limits. No adenopathy. There is atherosclerotic calcification in aorta. There is calcification within the left shoulder, stable. IMPRESSION: Tube and catheter positions as described without pneumothorax. Left lower lobe airspace consolidation. There has been interval clearing of pulmonary edema compared to 1 day prior. No new opacity evident. Stable cardiac prominence. Calcific tendinosis left shoulder. Electronically Signed   By: Lowella Grip III M.D.   On: 12/11/2015 07:16   Dg Chest Port 1 View  12/10/2015  CLINICAL DATA:  Sepsis, shortness of breath. EXAM: PORTABLE CHEST 1 VIEW COMPARISON:  Portable chest x-ray of December 09, 2015 FINDINGS: The lungs remain hyperinflated. The interstitial markings remain increased diffusely and have become more confluent. The retrocardiac region is more dense. There is no significant pleural effusion. The cardiac silhouette is mildly enlarged. The pulmonary vascularity remains engorged. The endotracheal tube tip lies 3.7 cm above the carina. The esophagogastric tube tip projects below the inferior margin of the image. The left internal jugular venous catheter tip projects over the proximal SVC. IMPRESSION: Persistent interstitial and alveolar  opacities bilaterally worrisome for pneumonia though there may be an element of pulmonary edema. Increasing retrocardiac and right lower lung opacities. The support tubes are in reasonable position. Electronically Signed   By: Naava Janeway  [place] M.D.   On: 12/10/2015 07:52   Portable Chest Xray  12/09/2015  CLINICAL DATA:  Status post line placement EXAM: PORTABLE CHEST 1 VIEW COMPARISON:  12/09/2015 FINDINGS:  A a left jugular central line is now seen in the mid superior vena cava. No pneumothorax is noted. An endotracheal tube and nasogastric catheter are noted in satisfactory position. Bilateral patchy opacities are again identified somewhat more consolidated than that seen on the prior exam. This may represent some mild improvement. No bony abnormality is seen. Cardiac shadow is stable. IMPRESSION: Tubes and lines as described above. Some more consolidated infiltrates are noted in perihilar distribution bilaterally. Electronically Signed   By: Inez Catalina M.D.   On: 12/09/2015 22:48   Dg Chest Port 1 View  12/09/2015  CLINICAL DATA:  Shortness of breath for 1 day EXAM: PORTABLE CHEST 1 VIEW COMPARISON:  Study obtained earlier in the day FINDINGS: There has been progression of interstitial and alveolar edema diffusely. Heart is enlarged with pulmonary venous hypertension. Atherosclerotic calcification is noted in the aorta. No adenopathy. IMPRESSION: Evidence of progression of congestive heart failure. There may be a degree of superimposed ARDS. Electronically Signed   By: Lowella Grip III M.D.   On: 12/09/2015 19:17   Dg Chest Portable 1 View  12/09/2015  CLINICAL DATA:  Shortness of breath since 5 a.m.; history of CHF and coronary artery disease with angioplasty and stent placement, peripheral vascular disease, diabetes ; former heavy smoker. EXAM: PORTABLE CHEST 1 VIEW COMPARISON:  Portable chest x-ray of June 05, 2015 FINDINGS: The lungs remain hyperinflated. The interstitial markings are increased  diffusely. The cardiac silhouette is mildly enlarged. The central pulmonary vascularity is prominent. There is no pleural effusion or pneumothorax. The trachea is midline. IMPRESSION: COPD with superimposed CHF. Atelectasis or developing pneumonia in the lower lobes bilaterally is present. There is no pleural effusion or pneumothorax. Electronically Signed   By: Marely Apgar  [place] M.D.   On: 12/09/2015 07:36   Dg Abd Portable 1v  12/09/2015  CLINICAL DATA:  Status post nasogastric catheter placement EXAM: PORTABLE ABDOMEN - 1 VIEW COMPARISON:  None. FINDINGS: Nasogastric catheter is noted coiled within the stomach. Fecal material is noted throughout the colon. No free air is seen. IMPRESSION: Nasogastric catheter within the stomach. Electronically Signed   By: Inez Catalina M.D.   On: 12/09/2015 22:48     Microbiology: Recent Results (from the past 240 hour(s))  MRSA PCR Screening     Status: None   Collection Time: 12/09/15  5:00 PM  Result Value Ref Range Status   MRSA by PCR NEGATIVE NEGATIVE Final    Comment:        The GeneXpert MRSA Assay (FDA approved for NASAL specimens only), is one component of a comprehensive MRSA colonization surveillance program. It is not intended to diagnose MRSA infection nor to guide or monitor treatment for MRSA infections.   Culture, blood (routine x 2) Call MD if unable to obtain prior to antibiotics being given     Status: None (Preliminary result)   Collection Time: 12/09/15 11:12 PM  Result Value Ref Range Status   Specimen Description BLOOD CENTRAL LINE  Final   Special Requests BOTTLES DRAWN AEROBIC AND ANAEROBIC 5ML  Final   Culture NO GROWTH 4 DAYS  Final   Report Status PENDING  Incomplete  Culture, respiratory (NON-Expectorated)     Status: None   Collection Time: 12/10/15  3:51 PM  Result Value Ref Range Status   Specimen Description TRACHEAL ASPIRATE  Final   Special Requests NONE  Final   Gram Stain   Final    ABUNDANT WBC PRESENT,  PREDOMINANTLY MONONUCLEAR NO ORGANISMS SEEN  Culture NO GROWTH 2 DAYS  Final   Report Status 12/12/2015 FINAL  Final     Labs: Basic Metabolic Panel:  Recent Labs Lab 12/09/15 2312 12/10/15 0430 12/10/15 1644 12/11/15 0428 12/12/15 0353 12/13/15 0436 12/14/15 0445  NA 134* 133*  134*  --  136 134* 136 137  K 5.2* 4.3  4.4  --  3.2* 3.9 4.2 3.4*  CL 102 102  103  --  102 98* 100* 101  CO2 23 23  23   --  24 28 27 26   GLUCOSE 167* 133*  133*  --  123* 93 150* 192*  BUN 19 21*  22*  --  18 10 11 18   CREATININE 1.13* 1.04*  1.04*  --  1.03* 0.77 0.83 0.89  CALCIUM 8.4* 8.2*  8.3*  --  8.0* 8.3* 9.1 8.8*  MG 1.5* 1.4*  1.4* 1.8 1.6* 2.0  --   --   PHOS 4.4 3.6 2.9 2.6 2.7  --   --    Liver Function Tests:  Recent Labs Lab 12/09/15 0734 12/10/15 0430  AST 45* 25  ALT 19 20  ALKPHOS 94 76  BILITOT 1.4* 0.7  PROT 6.2* 5.1*  ALBUMIN 2.5* 2.0*   No results for input(s): LIPASE, AMYLASE in the last 168 hours. No results for input(s): AMMONIA in the last 168 hours. CBC:  Recent Labs Lab 12/09/15 0734 12/10/15 0430 12/11/15 0428 12/12/15 0353 12/13/15 0436 12/14/15 0445  WBC 31.5* 9.4  9.9 9.7 11.5* 11.2* 11.3*  NEUTROABS 29.3* 7.9* 7.5  --   --   --   HGB 9.0* 6.9*  6.7* 7.8* 8.5* 8.8* 8.9*  HCT 28.0* 22.3*  21.6* 24.4* 26.9* 27.9* 28.3*  MCV 80.0 80.5  81.5 80.0 80.5 82.5 82.3  PLT PLATELET CLUMPS NOTED ON SMEAR, COUNT APPEARS ADEQUATE 294  316 257 336 323 353   Cardiac Enzymes:  Recent Labs Lab 12/09/15 0734  TROPONINI 0.07*   BNP: Invalid input(s): POCBNP CBG:  Recent Labs Lab 12/13/15 0741 12/13/15 1140 12/13/15 1609 12/13/15 2243 12/14/15 0625  GLUCAP 133* 262* 169* 229* 191*    Time coordinating discharge:  Greater than 30 minutes  Signed:  Ronelle Michie, DO [facility] Pager: [phone] 12/14/2015, 1:39 PM

## 2015-12-14 NOTE — Progress Notes (Signed)
Nutrition Follow-up   INTERVENTION:  Encourage PO intake with protein-rich foods at every meal Continue Boost High protein 1-2 times daily Recommend Multivitamin with minerals daily   NUTRITION DIAGNOSIS:   Inadequate oral intake related to inability to eat as evidenced by NPO status. Discontinued   NEW NUTRITION DIAGNOSIS:  Increased nutrient needs related to wound healing as evidenced by estimated needs.    GOAL:   Patient will meet greater than or equal to 90% of their needs  Improving  MONITOR:   PO intake, Supplement acceptance, Labs, Skin, I & O's  REASON FOR ASSESSMENT:   Consult Enteral/tube feeding initiation and management  ASSESSMENT:   Pt with medical history significant of HTN, HLD, DM, GERD, Raynaud's disease,  PVD, nephrolithiasis, anemia, CAD, MI, CHF, diabetic neuropathy, presenting w/ AMS and difficulty breathing.   Pt was extubated on 6/9 and diet was advanced to clears later that day. Pt was put on a Carb Modified diet on 6/10. Pt states that she is eating well; choosing softer foods to swallow more easily. Pt difficult to assess and care for as family at bedside (pt's husband) continued to interrupt RD's recommendations stating "She won't do that". Husband stated that patient drinks high protein Boost at home, but it causes her blood sugar to be high. RD tried to suggest alternative low carbohydrate nutritional supplements and additional vitamin and mineral supplements, but husband to cut RD off. Husband states that patient is going home anyway.  RD encouraged pt to eat adequate healthful meals and snacks with protein rich foods at each meal and recommended that patient take a multivitamin with minerals daily; recommended continuing Boost supplement, but to closely monitor blood glucose when she drinks it.   Labs: low hemoglobin, low TIBC, glucose ranging 133 to 262 mg/dL  Diet Order:  Diet Carb Modified Fluid consistency:: Thin; Room service  appropriate?: Yes Diet - low sodium heart healthy  Skin:  Wound (see comment) (L necrotic toes, L skin graft)  Last BM:  6/12  Height:   Ht Readings from Last 1 Encounters:  12/09/15 5\' 4"  (1.626 m)    Weight:   Wt Readings from Last 1 Encounters:  12/14/15 127 lb 4.8 oz (57.743 kg)    Ideal Body Weight:  50.9 kg  BMI:  Body mass index is 21.84 kg/(m^2).  Estimated Nutritional Needs:   Kcal:  1450-1650  Protein:  80-90 grams  Fluid:  1.6 L/day  EDUCATION NEEDS:   No education needs identified at this time  Dorothea Ogle RD, LDN Inpatient Clinical Dietitian Pager: [phone] After Hours Pager: [phone]

## 2015-12-14 NOTE — Care Management Important Message (Signed)
Important Message  Patient Details  Name: [name] MRN: [id number] Date of Birth: 04-24-1946   Medicare Important Message Given:  Yes    Bernadette Hoit 12/14/2015, 1:47 PM

## 2015-12-14 NOTE — Progress Notes (Signed)
Called and had discussion w/ pt/husband over meds.  Pt/husband had concerns that she was not getting her home Altace, but was getting generic ramipril.  Documentation currently present was not clear.  Pharmacy was able to research this and determine that she got her home Altace 1.25 mg 06/11.  SBP not well controlled on this, primary MD to determine if higher Altace dose needed.  Leanna Battles 12/14/2015 1:35 PM Beeper [phone]

## 2015-12-14 NOTE — Progress Notes (Signed)
Patient Name: [name] Date of Encounter: 12/14/2015  Active Problems:   CHF (congestive heart failure) (HCC)   Acute on chronic diastolic (congestive) heart failure (HCC)   Sepsis (HCC)   HCAP (healthcare-associated pneumonia)   Elevated troponin   Dry gangrene (HCC)   Diabetes mellitus with complication (HCC)   GERD without esophagitis   Essential hypertension   AKI (acute kidney injury) (HCC)   Anxiety   Chronic pain   HLD (hyperlipidemia)   Acute congestive heart failure (HCC)   Acute respiratory failure with hypoxemia [facility])   Pulmonary edema   Primary Cardiologist: Dr Excell Seltzer Patient Profile: 69 y.o. female w/ hx CAD (s/p PCI of LCx in 2010, cath in 2013 showing patency of LCx stent and mild nonobstructive CAD in LAD, nl LV function), severe PAD (s/p R BKA in 2013, L fem-pop bypass in 2009, aortobifem bypass 2013, L fem-pop above knee to below-knee popliteal BPG 08/19/2015),  L CEA  2005, HTN, DM2, HLD, and non-healing wounds (currently undergoing hyperbaric treatment) admitted 12/09/2015 for D-CHF  SUBJECTIVE: Feels she is breathing well, wonders if she can go home today. Husband helps with her care at home, she can do some things for herself. No chest pain   OBJECTIVE Filed Vitals:   12/13/15 1317 12/13/15 2247 12/14/15 0104 12/14/15 0439  BP: 142/65 163/75 167/67 161/47  Pulse:  89 86 90  Temp: 97.6 F (36.4 C) 98.4 F (36.9 C) 98.9 F (37.2 C) 98.5 F (36.9 C)  TempSrc: Oral Oral Oral Oral  Resp: Height:      Weight:    127 lb 4.8 oz (57.743 kg)  SpO2: 99% 99% 100% 100%    Intake/Output Summary (Last 24 hours) at 12/14/15 0937 Last data filed at 12/14/15 0600  Gross per 24 hour  Intake    120 ml  Output   1702 ml  Net  -1582 ml   Filed Weights   12/12/15 0344 12/13/15 0338 12/14/15 0439  Weight: 138 lb 14.2 oz (63 kg) 129 lb 6.6 oz (58.7 kg) 127 lb 4.8 oz (57.743 kg)    PHYSICAL EXAM General: Well developed, well nourished,  female in no acute distress. Head: Normocephalic, atraumatic.  Neck: Supple without bruits, JVD minimal elevation. Lungs:  Resp regular and unlabored, CTA. Heart: RRR, S1, S2, no S3, S4, soft murmur; no rub. Abdomen: Soft, non-tender, non-distended, BS + x 4.  Extremities: No clubbing, cyanosis, edema L leg, s/p R BKA.  Neuro: Alert and oriented X 3. Moves all extremities spontaneously. Psych: Normal affect.  LABS: CBC: Recent Labs  12/13/15 0436 12/14/15 0445  WBC 11.2* 11.3*  HGB 8.8* 8.9*  HCT 27.9* 28.3*  MCV 82.5 82.3  PLT 323 353   Basic Metabolic Panel: Recent Labs  12/12/15 0353 12/13/15 0436 12/14/15 0445  NA 134* 136 137  K 3.9 4.2 3.4*  CL 98* 100* 101  CO2 GLUCOSE 93 150* 192*  BUN CREATININE 0.77 0.83 0.89  CALCIUM 8.3* 9.1 8.8*  MG 2.0  --   --   PHOS 2.7  --   --    Anemia Panel: Recent Labs  12/14/15 0445  VITAMINB12 192  FERRITIN 111  TIBC 195*  IRON 36    TELE: SR, occ PVCs       Current Medications:  . sodium chloride   Intravenous Once  . ALPRAZolam  0.25 mg Oral BID  . aspirin  EC  81 mg Oral Daily  . collagenase   Topical Daily  . dexlansoprazole  60 mg Oral BID  . ezetimibe  10 mg Oral Daily  . furosemide  60 mg Oral Daily  . hydrALAZINE  10 mg Oral Q8H  . insulin aspart  0-15 Units Subcutaneous TID WC  . insulin aspart  0-5 Units Subcutaneous QHS  . insulin glargine  7 Units Subcutaneous QHS  . isosorbide mononitrate  30 mg Oral Daily  . metoprolol succinate  50 mg Oral Daily  . omega-3 acid ethyl esters  2 g Oral BID  . ramipril  10 mg Oral Daily  . rosuvastatin  10 mg Oral Q Mon  . sodium chloride flush  10-40 mL Intracatheter Q12H  . sodium chloride flush  3 mL Intravenous Q12H   . nitroGLYCERIN Stopped (12/09/15 1049)    ASSESSMENT AND PLAN:   Acute on chronic diastolic (congestive) heart failure (HCC) - in the setting of HTN crisis - BP control improved, meds adjusted - Pt diuresed and resp  now at baseline, on po Lasix - supplement K+ w/ 40 meq now and 20 meq qd - wt down 12 lbs since admit, not sure she can weigh at home, encourage if possible - possible ischemia trigger, consider MV as OP, would let pt f/u in office and discuss then.    Essential hypertension - If BP still high after increased dose of ramipril (starts today), increase hydralazine to 25 mg tid (new med) - see PN note, may need renal US to look for RAS (not mentioned in CT 08/2014).  Otherwise, per IM Active Problems:   CHF (congestive heart failure) (HCC)   Sepsis (HCC)   HCAP (healthcare-associated pneumonia)   Elevated troponin   Dry gangrene (HCC)   Diabetes mellitus with complication (HCC)   GERD without esophagitis   AKI (acute kidney injury) (HCC)   Anxiety   Chronic pain   HLD (hyperlipidemia)   Acute congestive heart failure (HCC)   Acute respiratory failure with hypoxemia (HCC)   Pulmonary edema   Signed, Leanna Battles 9:37 AM 12/14/2015   Patient seen and examined. I agree with the assessment and plan as detailed above. See also my additional thoughts below.    I spoke with the patient's husband in the room. The patient is stable from the cardiology viewpoint. She can be discharged home from the cardiology viewpoint. I explained to the patient's husband that the primary care team will have to see the patient and review all of the other medical problems before final decision can be made about possible discharge. She still has a Foley in place. She also has an IV in her neck.  Willa Rough, MD, [facility] 12/14/2015 11:10 AM

## 2015-12-15 LAB — FOLATE RBC
Folate, Hemolysate: 342.9 ng/mL
Folate, RBC: 1261 ng/mL (ref >498)
Hematocrit: 27.2 % — ABNORMAL LOW (ref 34.0–46.6)

## 2015-12-15 LAB — HEMOGLOBIN A1C
HEMOGLOBIN A1C: 6.1 % — AB (ref 4.8–5.6)
Mean Plasma Glucose: 128 mg/dL

## 2015-12-18 DIAGNOSIS — Z89511 Acquired absence of right leg below knee: Secondary | ICD-10-CM | POA: Diagnosis not present

## 2015-12-18 DIAGNOSIS — E11621 Type 2 diabetes mellitus with foot ulcer: Secondary | ICD-10-CM | POA: Diagnosis present

## 2015-12-18 DIAGNOSIS — I739 Peripheral vascular disease, unspecified: Secondary | ICD-10-CM | POA: Diagnosis not present

## 2015-12-18 DIAGNOSIS — L97521 Non-pressure chronic ulcer of other part of left foot limited to breakdown of skin: Secondary | ICD-10-CM | POA: Diagnosis not present

## 2015-12-18 DIAGNOSIS — I10 Essential (primary) hypertension: Secondary | ICD-10-CM | POA: Diagnosis not present

## 2015-12-18 DIAGNOSIS — Z87891 Personal history of nicotine dependence: Secondary | ICD-10-CM | POA: Diagnosis not present

## 2015-12-18 DIAGNOSIS — E11622 Type 2 diabetes mellitus with other skin ulcer: Secondary | ICD-10-CM | POA: Diagnosis not present

## 2015-12-18 DIAGNOSIS — L97822 Non-pressure chronic ulcer of other part of left lower leg with fat layer exposed: Secondary | ICD-10-CM | POA: Diagnosis not present

## 2015-12-18 DIAGNOSIS — L03116 Cellulitis of left lower limb: Secondary | ICD-10-CM | POA: Diagnosis not present

## 2015-12-21 DIAGNOSIS — E11621 Type 2 diabetes mellitus with foot ulcer: Secondary | ICD-10-CM | POA: Diagnosis not present

## 2015-12-21 LAB — GLUCOSE, CAPILLARY
Glucose-Capillary: 204 mg/dL — ABNORMAL HIGH (ref 65–99)
Glucose-Capillary: 155 mg/dL — ABNORMAL HIGH (ref 65–99)

## 2015-12-22 ENCOUNTER — Ambulatory Visit: Payer: Medicare Other | Admitting: Physician Assistant

## 2015-12-22 DIAGNOSIS — E11621 Type 2 diabetes mellitus with foot ulcer: Secondary | ICD-10-CM | POA: Diagnosis not present

## 2015-12-22 LAB — GLUCOSE, CAPILLARY
GLUCOSE-CAPILLARY: 257 mg/dL — AB (ref 65–99)
GLUCOSE-CAPILLARY: 208 mg/dL — AB (ref 65–99)

## 2015-12-23 DIAGNOSIS — E11621 Type 2 diabetes mellitus with foot ulcer: Secondary | ICD-10-CM | POA: Diagnosis not present

## 2015-12-24 DIAGNOSIS — E11621 Type 2 diabetes mellitus with foot ulcer: Secondary | ICD-10-CM | POA: Diagnosis not present

## 2015-12-24 LAB — GLUCOSE, CAPILLARY
GLUCOSE-CAPILLARY: 140 mg/dL — AB (ref 65–99)
GLUCOSE-CAPILLARY: 168 mg/dL — AB (ref 65–99)
Glucose-Capillary: 181 mg/dL — ABNORMAL HIGH (ref 65–99)
Glucose-Capillary: 156 mg/dL — ABNORMAL HIGH (ref 65–99)

## 2015-12-25 DIAGNOSIS — E11621 Type 2 diabetes mellitus with foot ulcer: Secondary | ICD-10-CM | POA: Diagnosis not present

## 2015-12-25 LAB — GLUCOSE, CAPILLARY
Glucose-Capillary: 179 mg/dL — ABNORMAL HIGH (ref 65–99)
Glucose-Capillary: 163 mg/dL — ABNORMAL HIGH (ref 65–99)

## 2015-12-28 DIAGNOSIS — E11621 Type 2 diabetes mellitus with foot ulcer: Secondary | ICD-10-CM | POA: Diagnosis not present

## 2015-12-28 LAB — GLUCOSE, CAPILLARY
GLUCOSE-CAPILLARY: 134 mg/dL — AB (ref 65–99)
GLUCOSE-CAPILLARY: 106 mg/dL — AB (ref 65–99)

## 2015-12-29 DIAGNOSIS — E11621 Type 2 diabetes mellitus with foot ulcer: Secondary | ICD-10-CM | POA: Diagnosis not present

## 2015-12-29 LAB — GLUCOSE, CAPILLARY
Glucose-Capillary: 126 mg/dL — ABNORMAL HIGH (ref 65–99)
Glucose-Capillary: 123 mg/dL — ABNORMAL HIGH (ref 65–99)
Glucose-Capillary: 80 mg/dL (ref 65–99)

## 2015-12-30 DIAGNOSIS — E11621 Type 2 diabetes mellitus with foot ulcer: Secondary | ICD-10-CM | POA: Diagnosis not present

## 2015-12-30 LAB — GLUCOSE, CAPILLARY
GLUCOSE-CAPILLARY: 158 mg/dL — AB (ref 65–99)
Glucose-Capillary: 122 mg/dL — ABNORMAL HIGH (ref 65–99)

## 2015-12-31 DIAGNOSIS — E11621 Type 2 diabetes mellitus with foot ulcer: Secondary | ICD-10-CM | POA: Diagnosis not present

## 2015-12-31 LAB — GLUCOSE, CAPILLARY
GLUCOSE-CAPILLARY: 162 mg/dL — AB (ref 65–99)
Glucose-Capillary: 176 mg/dL — ABNORMAL HIGH (ref 65–99)

## 2016-01-04 ENCOUNTER — Encounter (HOSPITAL_BASED_OUTPATIENT_CLINIC_OR_DEPARTMENT_OTHER): Payer: Medicare Other | Attending: Internal Medicine

## 2016-01-04 DIAGNOSIS — D649 Anemia, unspecified: Secondary | ICD-10-CM | POA: Insufficient documentation

## 2016-01-04 DIAGNOSIS — I7300 Raynaud's syndrome without gangrene: Secondary | ICD-10-CM | POA: Insufficient documentation

## 2016-01-04 DIAGNOSIS — I10 Essential (primary) hypertension: Secondary | ICD-10-CM | POA: Insufficient documentation

## 2016-01-04 DIAGNOSIS — L97521 Non-pressure chronic ulcer of other part of left foot limited to breakdown of skin: Secondary | ICD-10-CM | POA: Insufficient documentation

## 2016-01-04 DIAGNOSIS — E1152 Type 2 diabetes mellitus with diabetic peripheral angiopathy with gangrene: Secondary | ICD-10-CM | POA: Insufficient documentation

## 2016-01-04 DIAGNOSIS — E11622 Type 2 diabetes mellitus with other skin ulcer: Secondary | ICD-10-CM | POA: Insufficient documentation

## 2016-01-04 DIAGNOSIS — L97122 Non-pressure chronic ulcer of left thigh with fat layer exposed: Secondary | ICD-10-CM | POA: Insufficient documentation

## 2016-01-06 DIAGNOSIS — I7300 Raynaud's syndrome without gangrene: Secondary | ICD-10-CM | POA: Diagnosis not present

## 2016-01-06 DIAGNOSIS — L97521 Non-pressure chronic ulcer of other part of left foot limited to breakdown of skin: Secondary | ICD-10-CM | POA: Diagnosis not present

## 2016-01-06 DIAGNOSIS — D649 Anemia, unspecified: Secondary | ICD-10-CM | POA: Diagnosis not present

## 2016-01-06 DIAGNOSIS — E11622 Type 2 diabetes mellitus with other skin ulcer: Secondary | ICD-10-CM | POA: Diagnosis present

## 2016-01-06 DIAGNOSIS — I10 Essential (primary) hypertension: Secondary | ICD-10-CM | POA: Diagnosis not present

## 2016-01-06 DIAGNOSIS — E1152 Type 2 diabetes mellitus with diabetic peripheral angiopathy with gangrene: Secondary | ICD-10-CM | POA: Diagnosis not present

## 2016-01-06 DIAGNOSIS — L97122 Non-pressure chronic ulcer of left thigh with fat layer exposed: Secondary | ICD-10-CM | POA: Diagnosis not present

## 2016-01-11 DIAGNOSIS — E11622 Type 2 diabetes mellitus with other skin ulcer: Secondary | ICD-10-CM | POA: Diagnosis not present

## 2016-01-11 LAB — GLUCOSE, CAPILLARY
Glucose-Capillary: 250 mg/dL — ABNORMAL HIGH (ref 65–99)
Glucose-Capillary: 179 mg/dL — ABNORMAL HIGH (ref 65–99)

## 2016-01-12 DIAGNOSIS — E11622 Type 2 diabetes mellitus with other skin ulcer: Secondary | ICD-10-CM | POA: Diagnosis not present

## 2016-01-12 LAB — GLUCOSE, CAPILLARY
Glucose-Capillary: 237 mg/dL — ABNORMAL HIGH (ref 65–99)
Glucose-Capillary: 165 mg/dL — ABNORMAL HIGH (ref 65–99)

## 2016-01-13 DIAGNOSIS — E11622 Type 2 diabetes mellitus with other skin ulcer: Secondary | ICD-10-CM | POA: Diagnosis not present

## 2016-01-13 LAB — GLUCOSE, CAPILLARY
GLUCOSE-CAPILLARY: 260 mg/dL — AB (ref 65–99)
Glucose-Capillary: 175 mg/dL — ABNORMAL HIGH (ref 65–99)

## 2016-01-14 DIAGNOSIS — E11622 Type 2 diabetes mellitus with other skin ulcer: Secondary | ICD-10-CM | POA: Diagnosis not present

## 2016-01-14 LAB — GLUCOSE, CAPILLARY
Glucose-Capillary: 261 mg/dL — ABNORMAL HIGH (ref 65–99)
Glucose-Capillary: 185 mg/dL — ABNORMAL HIGH (ref 65–99)

## 2016-01-15 DIAGNOSIS — E11622 Type 2 diabetes mellitus with other skin ulcer: Secondary | ICD-10-CM | POA: Diagnosis not present

## 2016-01-15 LAB — GLUCOSE, CAPILLARY
GLUCOSE-CAPILLARY: 319 mg/dL — AB (ref 65–99)
Glucose-Capillary: 197 mg/dL — ABNORMAL HIGH (ref 65–99)

## 2016-01-18 DIAGNOSIS — E11622 Type 2 diabetes mellitus with other skin ulcer: Secondary | ICD-10-CM | POA: Diagnosis not present

## 2016-01-18 LAB — GLUCOSE, CAPILLARY
GLUCOSE-CAPILLARY: 275 mg/dL — AB (ref 65–99)
GLUCOSE-CAPILLARY: 214 mg/dL — AB (ref 65–99)

## 2016-01-19 DIAGNOSIS — E11622 Type 2 diabetes mellitus with other skin ulcer: Secondary | ICD-10-CM | POA: Diagnosis not present

## 2016-01-19 LAB — GLUCOSE, CAPILLARY
Glucose-Capillary: 231 mg/dL — ABNORMAL HIGH (ref 65–99)
Glucose-Capillary: 203 mg/dL — ABNORMAL HIGH (ref 65–99)

## 2016-01-20 DIAGNOSIS — E11622 Type 2 diabetes mellitus with other skin ulcer: Secondary | ICD-10-CM | POA: Diagnosis not present

## 2016-01-20 LAB — GLUCOSE, CAPILLARY
Glucose-Capillary: 210 mg/dL — ABNORMAL HIGH (ref 65–99)
Glucose-Capillary: 168 mg/dL — ABNORMAL HIGH (ref 65–99)

## 2016-01-21 DIAGNOSIS — E11622 Type 2 diabetes mellitus with other skin ulcer: Secondary | ICD-10-CM | POA: Diagnosis not present

## 2016-01-21 LAB — GLUCOSE, CAPILLARY
GLUCOSE-CAPILLARY: 175 mg/dL — AB (ref 65–99)
Glucose-Capillary: 213 mg/dL — ABNORMAL HIGH (ref 65–99)

## 2016-01-22 DIAGNOSIS — E11622 Type 2 diabetes mellitus with other skin ulcer: Secondary | ICD-10-CM | POA: Diagnosis not present

## 2016-01-22 LAB — GLUCOSE, CAPILLARY
GLUCOSE-CAPILLARY: 269 mg/dL — AB (ref 65–99)
GLUCOSE-CAPILLARY: 250 mg/dL — AB (ref 65–99)

## 2016-01-25 DIAGNOSIS — E11622 Type 2 diabetes mellitus with other skin ulcer: Secondary | ICD-10-CM | POA: Diagnosis not present

## 2016-01-25 LAB — GLUCOSE, CAPILLARY
Glucose-Capillary: 254 mg/dL — ABNORMAL HIGH (ref 65–99)
Glucose-Capillary: 210 mg/dL — ABNORMAL HIGH (ref 65–99)

## 2016-01-26 DIAGNOSIS — E11622 Type 2 diabetes mellitus with other skin ulcer: Secondary | ICD-10-CM | POA: Diagnosis not present

## 2016-01-26 LAB — GLUCOSE, CAPILLARY
GLUCOSE-CAPILLARY: 196 mg/dL — AB (ref 65–99)
Glucose-Capillary: 231 mg/dL — ABNORMAL HIGH (ref 65–99)

## 2016-01-27 ENCOUNTER — Ambulatory Visit (INDEPENDENT_AMBULATORY_CARE_PROVIDER_SITE_OTHER)
Admission: RE | Admit: 2016-01-27 | Discharge: 2016-01-27 | Disposition: A | Discharge status: Home / Self Care | Payer: Medicare Other | Source: Ambulatory Visit | Attending: Vascular Surgery | Admitting: Vascular Surgery

## 2016-01-27 ENCOUNTER — Ambulatory Visit (HOSPITAL_COMMUNITY)
Admission: RE | Admit: 2016-01-27 | Discharge: 2016-01-27 | Disposition: A | Discharge status: Home / Self Care | Payer: Medicare Other | Source: Ambulatory Visit | Attending: Vascular Surgery | Admitting: Vascular Surgery

## 2016-01-27 DIAGNOSIS — I2510 Atherosclerotic heart disease of native coronary artery without angina pectoris: Secondary | ICD-10-CM | POA: Diagnosis not present

## 2016-01-27 DIAGNOSIS — I6523 Occlusion and stenosis of bilateral carotid arteries: Secondary | ICD-10-CM

## 2016-01-27 DIAGNOSIS — E785 Hyperlipidemia, unspecified: Secondary | ICD-10-CM | POA: Insufficient documentation

## 2016-01-27 DIAGNOSIS — E1140 Type 2 diabetes mellitus with diabetic neuropathy, unspecified: Secondary | ICD-10-CM | POA: Insufficient documentation

## 2016-01-27 DIAGNOSIS — R938 Abnormal findings on diagnostic imaging of other specified body structures: Secondary | ICD-10-CM | POA: Insufficient documentation

## 2016-01-27 DIAGNOSIS — Z720 Tobacco use: Secondary | ICD-10-CM | POA: Diagnosis not present

## 2016-01-27 DIAGNOSIS — I739 Peripheral vascular disease, unspecified: Secondary | ICD-10-CM | POA: Diagnosis not present

## 2016-01-27 DIAGNOSIS — I509 Heart failure, unspecified: Secondary | ICD-10-CM | POA: Insufficient documentation

## 2016-01-27 DIAGNOSIS — Z48812 Encounter for surgical aftercare following surgery on the circulatory system: Secondary | ICD-10-CM

## 2016-01-27 DIAGNOSIS — I110 Hypertensive heart disease with heart failure: Secondary | ICD-10-CM | POA: Insufficient documentation

## 2016-01-27 DIAGNOSIS — F419 Anxiety disorder, unspecified: Secondary | ICD-10-CM | POA: Diagnosis not present

## 2016-01-27 DIAGNOSIS — E11622 Type 2 diabetes mellitus with other skin ulcer: Secondary | ICD-10-CM | POA: Diagnosis not present

## 2016-01-27 DIAGNOSIS — R0989 Other specified symptoms and signs involving the circulatory and respiratory systems: Secondary | ICD-10-CM | POA: Diagnosis present

## 2016-01-27 DIAGNOSIS — E113591 Type 2 diabetes mellitus with proliferative diabetic retinopathy without macular edema, right eye: Secondary | ICD-10-CM | POA: Diagnosis not present

## 2016-01-27 DIAGNOSIS — K219 Gastro-esophageal reflux disease without esophagitis: Secondary | ICD-10-CM | POA: Diagnosis not present

## 2016-01-27 LAB — GLUCOSE, CAPILLARY
GLUCOSE-CAPILLARY: 307 mg/dL — AB (ref 65–99)
GLUCOSE-CAPILLARY: 242 mg/dL — AB (ref 65–99)

## 2016-02-01 DIAGNOSIS — E11622 Type 2 diabetes mellitus with other skin ulcer: Secondary | ICD-10-CM | POA: Diagnosis not present

## 2016-02-02 ENCOUNTER — Ambulatory Visit: Payer: Medicare Other | Admitting: Vascular Surgery

## 2016-02-08 ENCOUNTER — Encounter (HOSPITAL_BASED_OUTPATIENT_CLINIC_OR_DEPARTMENT_OTHER): Payer: Medicare Other | Attending: Internal Medicine

## 2016-02-08 DIAGNOSIS — L98492 Non-pressure chronic ulcer of skin of other sites with fat layer exposed: Secondary | ICD-10-CM | POA: Diagnosis not present

## 2016-02-08 DIAGNOSIS — I10 Essential (primary) hypertension: Secondary | ICD-10-CM | POA: Diagnosis not present

## 2016-02-08 DIAGNOSIS — E11622 Type 2 diabetes mellitus with other skin ulcer: Secondary | ICD-10-CM | POA: Diagnosis present

## 2016-02-08 DIAGNOSIS — L97521 Non-pressure chronic ulcer of other part of left foot limited to breakdown of skin: Secondary | ICD-10-CM | POA: Diagnosis not present

## 2016-02-16 DIAGNOSIS — E11622 Type 2 diabetes mellitus with other skin ulcer: Secondary | ICD-10-CM | POA: Diagnosis not present

## 2016-02-22 ENCOUNTER — Encounter: Payer: Self-pay | Admitting: Vascular Surgery

## 2016-02-22 DIAGNOSIS — E11622 Type 2 diabetes mellitus with other skin ulcer: Secondary | ICD-10-CM | POA: Diagnosis not present

## 2016-02-23 ENCOUNTER — Ambulatory Visit (INDEPENDENT_AMBULATORY_CARE_PROVIDER_SITE_OTHER): Payer: Medicare Other | Admitting: Vascular Surgery

## 2016-02-23 ENCOUNTER — Encounter: Payer: Self-pay | Admitting: Vascular Surgery

## 2016-02-23 VITALS — BP 122/57 | HR 67 | Temp 97.2°F | Resp 16 | Ht 64.0 in | Wt 137.0 lb

## 2016-02-23 DIAGNOSIS — I779 Disorder of arteries and arterioles, unspecified: Secondary | ICD-10-CM | POA: Diagnosis not present

## 2016-02-23 IMAGING — CR DG TOE GREAT 2+V*L*
3 series · 3 of 3 positions shown · non-contrast
Comparison: None.

CLINICAL DATA: Diabetic nonhealing ulcer

EXAM:
LEFT GREAT TOE

[t toes ap left]
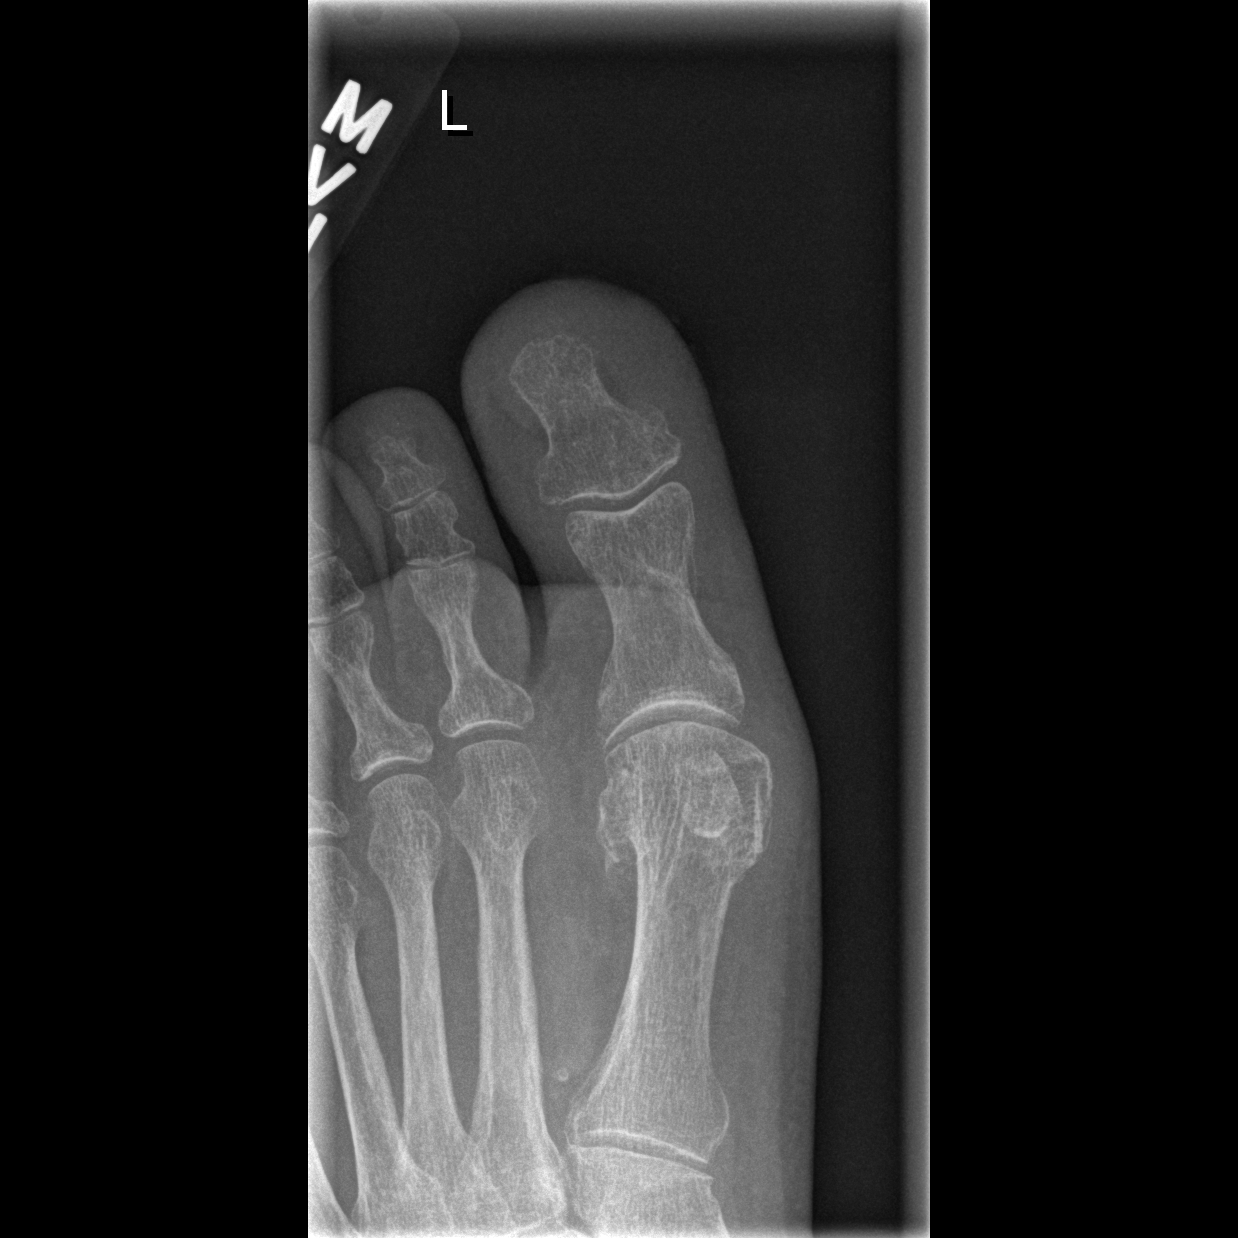

[t toes oblique left]
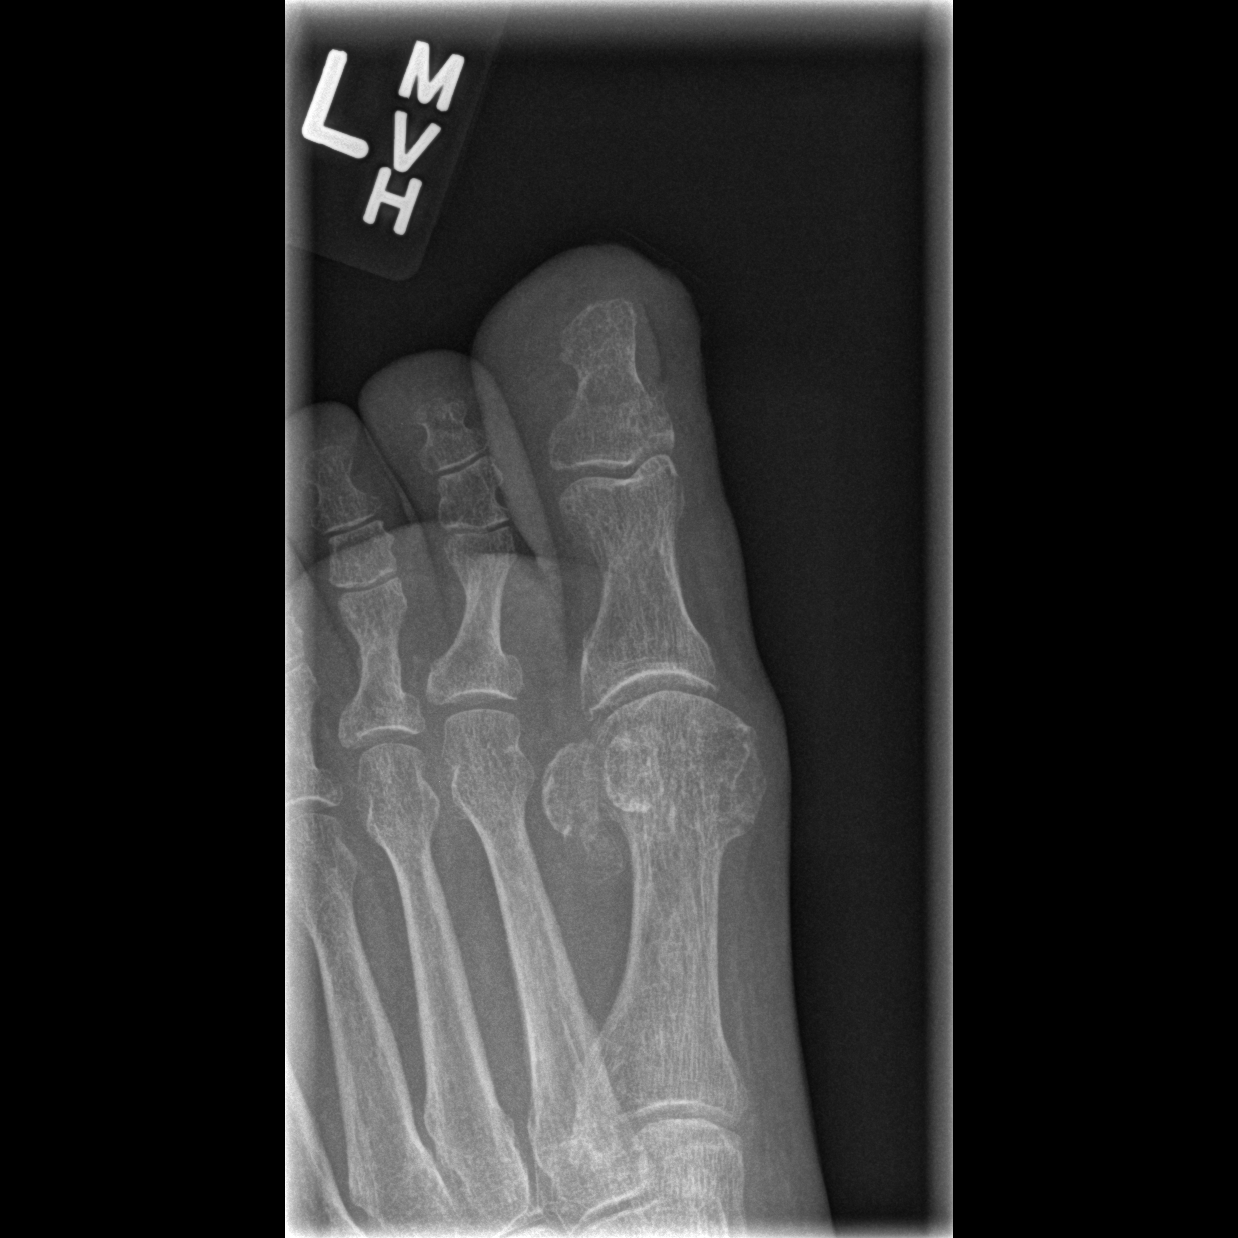

[t toes lateral left]
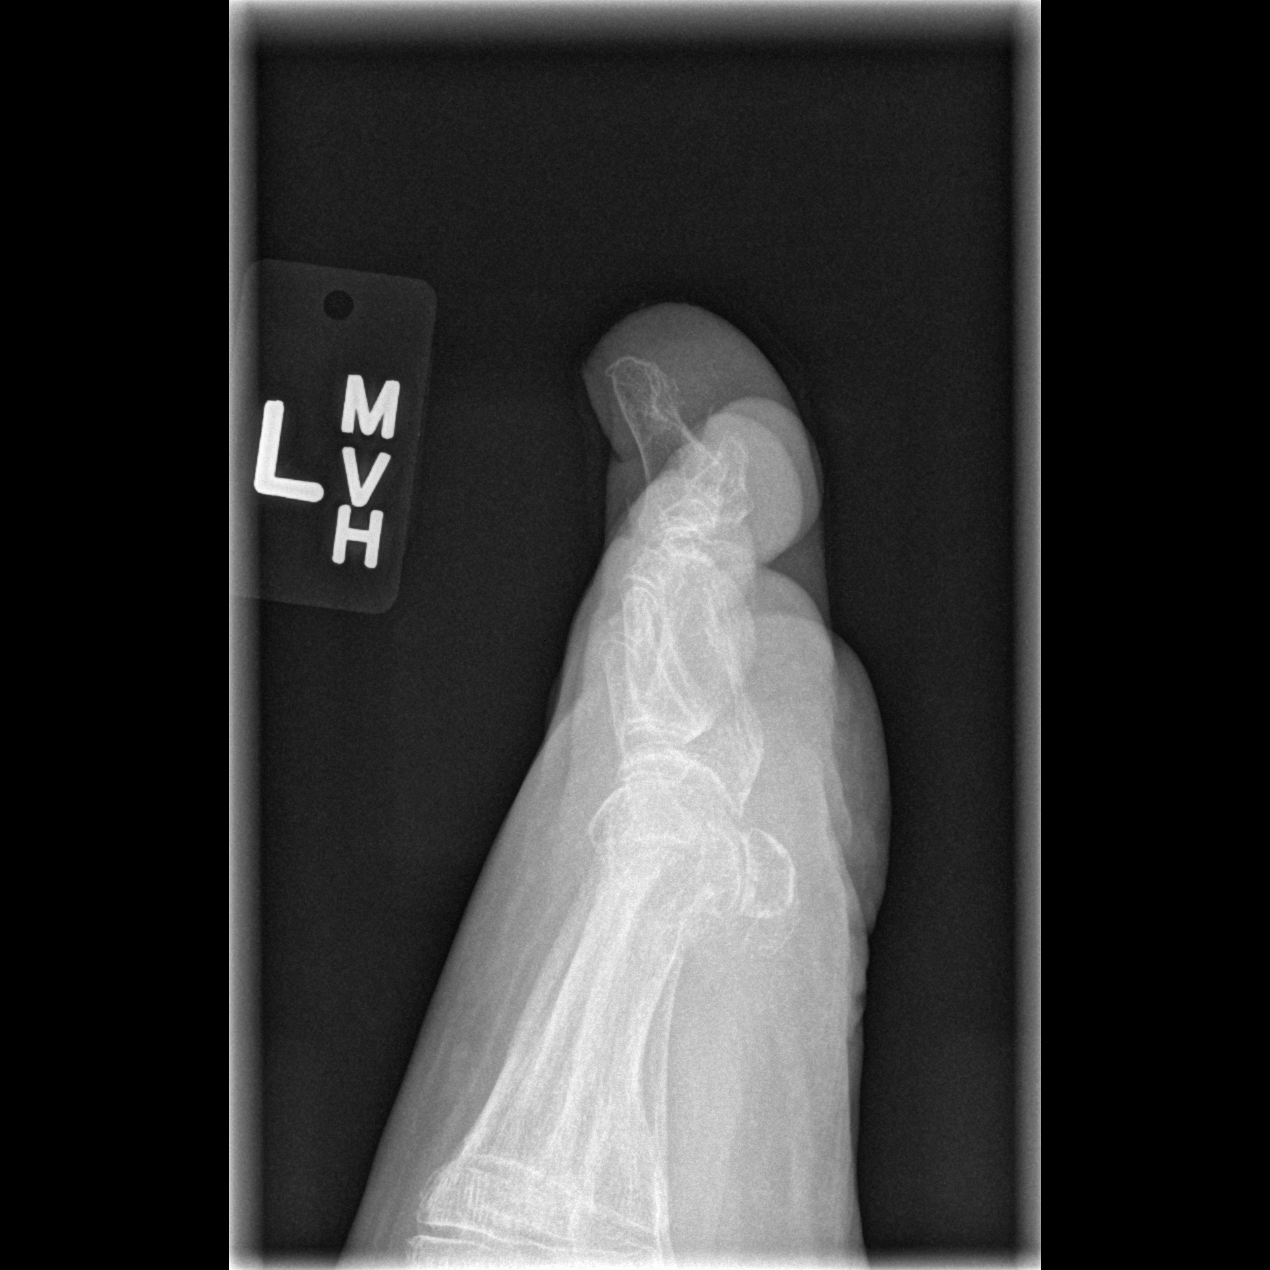

[3 of 3 positions shown; findings below may reference images not displayed]

FINDINGS: Bones are osteopenic. Mild degenerative changes of the left first
MTP joint. Normal alignment without acute fracture. Mild soft tissue
swelling. No area of bone loss, destruction, or significant
periostitis.
IMPRESSION: Osteopenia.

No definite plain film evidence of osteomyelitis

## 2016-02-23 NOTE — Progress Notes (Signed)
[facility] [place]  Patient name: [name] MRN: [id number] DOB: Jun 06, 1946 Sex: female  REASON FOR VISIT: Follow-up  HPI: [name] is a 70 y.o. female who presents for continued follow-up of her extension bypass from her left distal anastomosis above knee to below knee popliteal bypass with reversed small saphenous vein on 08/19/2015. The patient is doing well today. She has been undergoing hyperbaric treatment at the wound center for areas of fat necrosis on her thigh. She originally had 8 spots which have all healed except for 1.  Past Medical History:  Diagnosis Date  . Anemia    hx  . Anxiety   . Carotid artery occlusion   . CHF (congestive heart failure) ([place])   . Constipation   . Coronary artery disease    a. s/p NSTEMI 02/2009 - PCI LCX with Xience DES. Otherwise branch vessel and Dist. RCA dzs. NL EF.  b. 2013: cath showing patency of LCx stent and mild nonobstructive dz in LAD; normal LV function  . Diabetic neuropathy with neurologic complication ([place])   . Fall from slipping on wet surface Jan. 7, 2014   Right stump bleeding from fall  . GERD (gastroesophageal reflux disease)   . Goiter   . Hernia, umbilical   . History of blood transfusion   . History of right below knee amputation ([place])   . Hyperlipidemia    Mixed  . Hypertension    Unspecified  . Kidney stones   . Loose, teeth    has loose bridge and two loose teeth holding it  . Myocardial infarction ([place])   . Peripheral vascular disease (HCC)    unspecified, a. s/p L CEA b. s/p B fem-pop bypass c. s/p R BKA  . PONV (postoperative nausea and vomiting)   . Proliferative diabetic retinopathy of right eye ([place])   . Raynaud disease   . Stones in the urinary tract   . Subclavian steal syndrome   . Tobacco abuse    Remote  . Type II diabetes mellitus ([place])   . UTI (lower urinary tract infection)   . UTI (urinary tract infection)   . Vitamin D deficiency     Family  History  Problem Relation Age of Onset  . Heart attack Mother   . Heart attack Father   . Hypertension Brother   . Diabetes Brother   . Hypertension Brother     SOCIAL HISTORY: Social History  Substance Use Topics  . Smoking status: Former Smoker    Packs/day: 2.00    Years: 39.00    Types: Cigarettes    Quit date: 07/05/2001  . Smokeless tobacco: Never Used  . Alcohol use 9.0 oz/week    1 Shots of liquor, 14 Standard drinks or equivalent per week     Comment: burbon     Allergies  Allergen Reactions  . Acetaminophen Nausea Only and Other (See Comments)    Does not tolerate well, nausea  . Ceftriaxone Itching    Pt has tolerated amoxicillin and cephalexin in the past.  . Codeine Nausea And Vomiting  . Erythromycin Nausea And Vomiting  . Hydrocodone-Acetaminophen Nausea And Vomiting  . Hydromorphone Nausea And Vomiting  . Propoxyphene Hcl Nausea And Vomiting  . Statins Other (See Comments)    Leg myalgias  . Eggs Or Egg-Derived Products Other (See Comments)    "pt does not eat" Unspecified reaction  . Ibuprofen Other (See Comments)    Unspecified  Per Pt "Does not  tolerate well"  . Shellfish-Derived Products Other (See Comments)    "pt does not eat" Unspecified reaction    Current Outpatient Prescriptions  Medication Sig Dispense Refill  . ALPRAZolam (XANAX) 0.25 MG tablet Take 0.25 mg by mouth 2 (two) times daily.    Marland Kitchen ALTACE 1.25 MG capsule Take 2 capsules (2.5 mg total) by mouth daily. BRAND NAME ONLY 60 capsule 0  . aspirin EC 81 MG tablet Take 1 tablet (81 mg total) by mouth daily.    . calcium carbonate (TUMS - DOSED IN MG ELEMENTAL CALCIUM) 500 MG chewable tablet Chew 1,000 mg by mouth daily as needed for indigestion or heartburn.    . dexlansoprazole (DEXILANT) 60 MG capsule Take 60 mg by mouth 2 (two) times daily.    . ergocalciferol (VITAMIN D2) 50000 units capsule Take 50,000 Units by mouth 3 (three) times a week.    . furosemide (LASIX) 20 MG tablet  Take 3 tablets (60 mg total) by mouth daily. (Patient taking differently: Take 80 mg by mouth daily. ) 90 tablet 0  . hydrALAZINE (APRESOLINE) 25 MG tablet Take 1 tablet (25 mg total) by mouth every 8 (eight) hours. 90 tablet 0  . Insulin Degludec (TRESIBA FLEXTOUCH) 100 UNIT/ML SOPN Inject 14 Units into the skin daily.     . iron polysaccharides (NIFEREX) 150 MG capsule Take 450 mg by mouth 2 (two) times daily.     . isosorbide mononitrate (IMDUR) 30 MG 24 hr tablet Take 30 mg by mouth daily.    . Lactobacillus (ACIDOPHILUS PO) Take 1 capsule by mouth daily.    . meperidine (DEMEROL) 50 MG tablet Take 1-2 tablets (50-100 mg total) by mouth every 4 (four) hours as needed for severe pain. (Patient taking differently: Take 50 mg by mouth. Take one tablets every 4 hours as needed for pain (1-5);  Take two tablets every 4 hours as needed for pain (6-10)) 60 tablet 0  . metoprolol succinate (TOPROL-XL) 50 MG 24 hr tablet Take 1 tablet (50 mg total) by mouth daily. 30 tablet 1  . nitroGLYCERIN (NITROSTAT) 0.4 MG SL tablet Place 1 tablet (0.4 mg total) under the tongue every 5 (five) minutes as needed. For chest pain 25 tablet 2  . omega-3 acid ethyl esters (LOVAZA) 1 G capsule Take 2 g by mouth 2 (two) times daily.     . rosuvastatin (CRESTOR) 10 MG tablet Take 10 mg by mouth every Monday.     . ezetimibe (ZETIA) 10 MG tablet TAKE 1 TABLET BY MOUTH EVERY DAY (Patient not taking: Reported on 02/23/2016) 90 tablet 2  . pioglitazone-metformin (ACTOPLUS MET) 15-850 MG per tablet Take 1 tablet by mouth daily. Brand name only-does not work if components are given separately per patient 15-850 MG    . potassium chloride (K-DUR) 10 MEQ tablet Take 1 tablet (10 mEq total) by mouth 2 (two) times daily. (Patient not taking: Reported on 02/23/2016) 60 tablet 1  . sitaGLIPtan (JANUVIA) 100 MG tablet Take 100 mg by mouth at bedtime.     . sodium chloride (OCEAN) 0.65 % SOLN nasal spray Place 2-4 sprays into both nostrils  every 4 (four) hours as needed (for nose bleeds).     No current facility-administered medications for this visit.     REVIEW OF SYSTEMS:  _0  denotes positive finding, _1  denotes negative finding Cardiac  Comments:  Chest pain or chest pressure:    Shortness of breath upon exertion:    Short of breath  when lying flat:    Irregular heart rhythm:        Vascular    Pain in calf, thigh, or hip brought on by ambulation:    Pain in feet at night that wakes you up from your sleep:     Blood clot in your veins:    Leg swelling:         Pulmonary    Oxygen at home:    Productive cough:     Wheezing:         Neurologic    Sudden weakness in arms or legs:     Sudden numbness in arms or legs:     Sudden onset of difficulty speaking or slurred speech:    Temporary loss of vision in one eye:     Problems with dizziness:         Gastrointestinal    Blood in stool:     Vomited blood:         Genitourinary    Burning when urinating:     Blood in urine:        Psychiatric    Major depression:         Hematologic    Bleeding problems:    Problems with blood clotting too easily:        Skin    Rashes or ulcers:        Constitutional    Fever or chills:      PHYSICAL EXAM: Vitals:   02/23/16 1301  BP: (!) 122/57  Pulse: 67  Resp: 16  Temp: 97.2 F (36.2 C)  TempSrc: Oral  SpO2: 99%  Weight: 137 lb (62.1 kg)  Height: _0  (1.626 m)    GENERAL: The patient is a well-nourished female, in no acute distress. The vital signs are documented above. VASCULAR: Nonpalpable left pedal pulses. Left small saphenectomy incision well healed. Left foot is pink with dry gangrene to tips of second and third toes. No erythema and no drainage. PULMONARY: Nonlabored respiratory effort. MUSCULOSKELETAL: Right BKA. NEUROLOGIC: No focal weakness or paresthesias are detected. PSYCHIATRIC: The patient has a normal affect.  DATA:  ABIs 01/27/2016  Left: 0.67, TBI: 0.19. Monophasic  waveforms present.  Left lower extremity arterial duplex 01/27/2016  Patent left femoral to above knee popliteal bypass graft. Left distal anastomosis to below-knee popliteal bypass is patent but with increased velocities at proximal anastomosis.  MEDICAL ISSUES:  Peripheral arterial disease Dry gangrene left second and third toes  The patient is doing well. The dry gangrene to her left second and third toes is stable and continues to demarcate. Explained that this will likely auto amputate. Discussed that she may come in for further debridement once this happens. There was an area of increased velocities at the proximal anastomosis of her jump bypass from the distal above-knee to below-knee popliteal bypass. We will continue to monitor this. She will follow up in 6 months with repeat ABIs and left lower extremity graft duplex. She knows to return sooner she has any issues.    Virgina Jock, PA-C [facility] [place]  I have examined the patient, reviewed and agree with above. Looks quite good today. Continue to allow separation of the dry gangrenous changes in her second and third toes  Curt Jews, MD 02/23/2016 1:54 PM

## 2016-02-29 DIAGNOSIS — E11622 Type 2 diabetes mellitus with other skin ulcer: Secondary | ICD-10-CM | POA: Diagnosis not present

## 2016-03-14 ENCOUNTER — Encounter (HOSPITAL_BASED_OUTPATIENT_CLINIC_OR_DEPARTMENT_OTHER): Payer: Medicare Other | Attending: Internal Medicine

## 2016-03-14 DIAGNOSIS — E11622 Type 2 diabetes mellitus with other skin ulcer: Secondary | ICD-10-CM | POA: Diagnosis not present

## 2016-03-14 DIAGNOSIS — L97822 Non-pressure chronic ulcer of other part of left lower leg with fat layer exposed: Secondary | ICD-10-CM | POA: Diagnosis not present

## 2016-03-14 DIAGNOSIS — I10 Essential (primary) hypertension: Secondary | ICD-10-CM | POA: Diagnosis not present

## 2016-03-28 DIAGNOSIS — E11622 Type 2 diabetes mellitus with other skin ulcer: Secondary | ICD-10-CM | POA: Diagnosis not present

## 2016-04-06 ENCOUNTER — Telehealth: Payer: Self-pay | Admitting: Cardiovascular Disease

## 2016-04-06 NOTE — Telephone Encounter (Signed)
Left message on machine for pt to contact the office.  Recall is in the system for January 2018 with Dr Excell Seltzer.

## 2016-04-06 NOTE — Telephone Encounter (Signed)
F/u Message ° °Pt returning RN call. Please call back to discuss  °

## 2016-04-06 NOTE — Telephone Encounter (Signed)
New message  Pt call requesting to speak with RN about being able to be fit into Dr Excell Seltzer schedule. Pt was not happy about having to wait to be placed on providers next opeing schedule. Please call back to discuss

## 2016-04-06 NOTE — Telephone Encounter (Signed)
I spoke with the pt and made her aware that she is due to schedule her next follow-up with Dr Excell Seltzer in January 2018 and the MD schedule has not been templated into [employer] for [place] to schedule at this time. I advised her that I will contact her to schedule an appointment when this becomes available.

## 2016-04-11 ENCOUNTER — Encounter (HOSPITAL_BASED_OUTPATIENT_CLINIC_OR_DEPARTMENT_OTHER): Payer: Medicare Other | Attending: Internal Medicine

## 2016-04-11 DIAGNOSIS — L98492 Non-pressure chronic ulcer of skin of other sites with fat layer exposed: Secondary | ICD-10-CM | POA: Diagnosis not present

## 2016-04-11 DIAGNOSIS — E11622 Type 2 diabetes mellitus with other skin ulcer: Secondary | ICD-10-CM | POA: Diagnosis present

## 2016-04-11 DIAGNOSIS — I10 Essential (primary) hypertension: Secondary | ICD-10-CM | POA: Diagnosis not present

## 2016-04-15 NOTE — Telephone Encounter (Signed)
Pt scheduled appointment 07/13/2016.

## 2016-04-15 NOTE — Telephone Encounter (Signed)
Left message on pt's voicemail to contact the office and arrange January OV per recall in the system.

## 2016-04-25 DIAGNOSIS — E11622 Type 2 diabetes mellitus with other skin ulcer: Secondary | ICD-10-CM | POA: Diagnosis not present

## 2016-05-09 ENCOUNTER — Encounter (HOSPITAL_BASED_OUTPATIENT_CLINIC_OR_DEPARTMENT_OTHER): Payer: Medicare Other | Attending: Internal Medicine

## 2016-05-09 DIAGNOSIS — E11622 Type 2 diabetes mellitus with other skin ulcer: Secondary | ICD-10-CM | POA: Insufficient documentation

## 2016-05-09 DIAGNOSIS — L97122 Non-pressure chronic ulcer of left thigh with fat layer exposed: Secondary | ICD-10-CM | POA: Insufficient documentation

## 2016-05-09 DIAGNOSIS — E1151 Type 2 diabetes mellitus with diabetic peripheral angiopathy without gangrene: Secondary | ICD-10-CM | POA: Insufficient documentation

## 2016-05-13 DIAGNOSIS — E1151 Type 2 diabetes mellitus with diabetic peripheral angiopathy without gangrene: Secondary | ICD-10-CM | POA: Diagnosis not present

## 2016-05-13 DIAGNOSIS — E11622 Type 2 diabetes mellitus with other skin ulcer: Secondary | ICD-10-CM | POA: Diagnosis present

## 2016-05-13 DIAGNOSIS — L97122 Non-pressure chronic ulcer of left thigh with fat layer exposed: Secondary | ICD-10-CM | POA: Diagnosis not present

## 2016-06-02 NOTE — Addendum Note (Signed)
Addended by: Burton Apley A on: 06/02/2016 08:53 AM   Modules accepted: Orders

## 2016-06-13 ENCOUNTER — Encounter (HOSPITAL_BASED_OUTPATIENT_CLINIC_OR_DEPARTMENT_OTHER): Payer: Medicare Other | Attending: Internal Medicine

## 2016-06-13 DIAGNOSIS — L97829 Non-pressure chronic ulcer of other part of left lower leg with unspecified severity: Secondary | ICD-10-CM | POA: Diagnosis not present

## 2016-06-13 DIAGNOSIS — E11622 Type 2 diabetes mellitus with other skin ulcer: Secondary | ICD-10-CM | POA: Diagnosis present

## 2016-06-13 DIAGNOSIS — E1151 Type 2 diabetes mellitus with diabetic peripheral angiopathy without gangrene: Secondary | ICD-10-CM | POA: Insufficient documentation

## 2016-07-11 ENCOUNTER — Encounter (HOSPITAL_BASED_OUTPATIENT_CLINIC_OR_DEPARTMENT_OTHER): Payer: Medicare Other | Attending: Internal Medicine

## 2016-07-11 DIAGNOSIS — E1152 Type 2 diabetes mellitus with diabetic peripheral angiopathy with gangrene: Secondary | ICD-10-CM | POA: Diagnosis not present

## 2016-07-11 DIAGNOSIS — L97821 Non-pressure chronic ulcer of other part of left lower leg limited to breakdown of skin: Secondary | ICD-10-CM | POA: Insufficient documentation

## 2016-07-11 DIAGNOSIS — E11622 Type 2 diabetes mellitus with other skin ulcer: Secondary | ICD-10-CM | POA: Insufficient documentation

## 2016-07-11 DIAGNOSIS — E11621 Type 2 diabetes mellitus with foot ulcer: Secondary | ICD-10-CM | POA: Insufficient documentation

## 2016-07-11 DIAGNOSIS — L97521 Non-pressure chronic ulcer of other part of left foot limited to breakdown of skin: Secondary | ICD-10-CM | POA: Diagnosis not present

## 2016-07-11 DIAGNOSIS — I10 Essential (primary) hypertension: Secondary | ICD-10-CM | POA: Diagnosis not present

## 2016-07-13 ENCOUNTER — Encounter (INDEPENDENT_AMBULATORY_CARE_PROVIDER_SITE_OTHER): Payer: Self-pay

## 2016-07-13 ENCOUNTER — Ambulatory Visit (INDEPENDENT_AMBULATORY_CARE_PROVIDER_SITE_OTHER): Payer: Medicare Other | Admitting: Cardiovascular Disease

## 2016-07-13 VITALS — BP 116/62 | HR 88 | Ht 64.0 in | Wt 142.0 lb

## 2016-07-13 DIAGNOSIS — I2510 Atherosclerotic heart disease of native coronary artery without angina pectoris: Secondary | ICD-10-CM

## 2016-07-13 NOTE — Progress Notes (Signed)
Cardiology Office Note Date:  07/17/2016   ID:  Joesph [name], DOB Jan 07, 1946, MRN [id number]  PCP:  Sheela Stack, MD  Cardiologist:  Sherren Mocha, MD    Chief Complaint  Patient presents with  . Congestive Heart Failure     History of Present Illness: [name] is a 71 y.o. female who presents for follow-up evaluation. The patient is followed for coronary artery disease and chronic diastolic heart failure. She also has extensive vascular disease. She has undergone left femoral popliteal bypass grafting in 2009, aortobifemoral bypass grafting in 2013, and right below-knee amputation in 2013. She had a left carotid endarterectomy in 2005. From a coronary perspective, she has undergone stenting of the left circumflex at the time of a non-ST elevation MI. Her most recent heart catheterization in 2013 demonstrated continued patency of the left circumflex with mild nonobstructive disease in the LAD and normal LV function.  She is here with her husband today. She was admitted in [name] 2017 with congestive heart failure and responded well to IV diuresis. At the time her blood pressure was uncontrolled and this was felt to be the precipitating factor. She now is doing quite well. She's been compliant with her medications. Her blood pressure has been well controlled. She's had no recent problems with chest pain, shortness of breath, edema, orthopnea, or PND. Her vascular disease has been stable recently.   Past Medical History:  Diagnosis Date  . Anemia    hx  . Anxiety   . Carotid artery occlusion   . CHF (congestive heart failure) ([place])   . Constipation   . Coronary artery disease    a. s/p NSTEMI 02/2009 - PCI LCX with Xience DES. Otherwise branch vessel and Dist. RCA dzs. NL EF.  b. 2013: cath showing patency of LCx stent and mild nonobstructive dz in LAD; normal LV function  . Diabetic neuropathy with neurologic complication ([place])   . Fall from slipping on wet surface Jan.  7, 2014   Right stump bleeding from fall  . GERD (gastroesophageal reflux disease)   . Goiter   . Hernia, umbilical   . History of blood transfusion   . History of right below knee amputation ([place])   . Hyperlipidemia    Mixed  . Hypertension    Unspecified  . Kidney stones   . Loose, teeth    has loose bridge and two loose teeth holding it  . Myocardial infarction   . Peripheral vascular disease (HCC)    unspecified, a. s/p L CEA b. s/p B fem-pop bypass c. s/p R BKA  . PONV (postoperative nausea and vomiting)   . Proliferative diabetic retinopathy of right eye ([place])   . Raynaud disease   . Stones in the urinary tract   . Subclavian steal syndrome   . Tobacco abuse    Remote  . Type II diabetes mellitus ([place])   . UTI (lower urinary tract infection)   . UTI (urinary tract infection)   . Vitamin D deficiency     Past Surgical History:  Procedure Laterality Date  . ABDOMINAL ANGIOGRAM  09/21/2011   Procedure: ABDOMINAL ANGIOGRAM;  Surgeon: Serafina Mitchell, MD;  Location: [facility] CATH LAB;  Service: Cardiovascular;;  . ABDOMINAL AORTAGRAM N/A 05/17/2011   Procedure: ABDOMINAL Maxcine Ham;  Surgeon: Serafina Mitchell, MD;  Location: [facility] CATH LAB;  Service: Cardiovascular;  Laterality: N/A;  . ABDOMINAL AORTAGRAM N/A 01/17/2012   Procedure: ABDOMINAL Maxcine Ham;  Surgeon: Serafina Mitchell, MD;  Location:  [facility] CATH LAB;  Service: Cardiovascular;  Laterality: N/A;  . ABDOMINAL HYSTERECTOMY     complete  . AMPUTATION  04/23/2012   Procedure: AMPUTATION BELOW KNEE;  Surgeon: Rosetta Posner, MD;  Location: [facility];  Service: Vascular;  Laterality: Right;  . AORTA - BILATERAL FEMORAL ARTERY BYPASS GRAFT  03/26/2012   Procedure: AORTA BIFEMORAL BYPASS GRAFT;  Surgeon: Rosetta Posner, MD;  [facility] OR;  Service: Vascular;  Laterality: N/A;  Aortic-bifemoral bypass using 14x62m Hemashield graft .   .Marland KitchenBREAST LUMPECTOMY     right  . CAROTID ENDARTERECTOMY  ~ 2005   left  . COLONOSCOPY  11/2010  . CORONARY  ANGIOPLASTY WITH STENT PLACEMENT    . EYE SURGERY Right Feb. 9, 2015   Cataract  . EYE SURGERY Left Feb. 16, 2015   Cataract  . FEMORAL ARTERY - POPLITEAL ARTERY BYPASS GRAFT     left  . FEMORAL ARTERY EXPLORATION  03/26/2012   Procedure: FEMORAL ARTERY EXPLORATION;  Surgeon: TRosetta Posner MD;  [facility];  Service: Vascular;  Laterality: Right;  with Revision of Popliteal-Peroneal bypass graft using 651mx 10cm thin wall goretex graft  . FEMORAL-POPLITEAL BYPASS GRAFT  08/22/2011   Procedure: BYPASS GRAFT FEMORAL-POPLITEAL ARTERY;  Surgeon: ToCurt JewsMD;  [facility]: [facility];  Laterality: Right;  Thrombectomy and Revision using 4m22m 10cm stretch goretex graft  . FEMORAL-TIBIAL BYPASS GRAFT Left 08/19/2015   Procedure: LEFT FEMORAL-  ABOVE KNEE POPLITEAL BYPASS GRAFT (VEIN) to BELGrand JunctionSurgeon: TodRosetta PosnerD;  [facility];  Laterality: Left;  . HERNIA REPAIR    . INTRAOPERATIVE ARTERIOGRAM  08/22/2011   Procedure: INTRA OPERATIVE ARTERIOGRAM;  Surgeon: TodCurt JewsD;  [facility];  Service: Vascular;  Laterality: Right;  to lower leg  . LEFT HEART CATHETERIZATION WITH CORONARY ANGIOGRAM N/A 04/05/2012   Procedure: LEFT HEART CATHETERIZATION WITH CORONARY ANGIOGRAM;  Surgeon: MicSherren MochaD;  [facility];  Service: Cardiovascular;  Laterality: N/A;  . LOWER EXTREMITY ANGIOGRAM  05/17/2011   Procedure: LOWER EXTREMITY ANGIOGRAM;  Surgeon: VanSerafina MitchellD;  [facility];  Service: Cardiovascular;;  . LOWER EXTREMITY ANGIOGRAM N/A 09/21/2011   Procedure: LOWER EXTREMITY ANGIOGRAM;  Surgeon: VanSerafina MitchellD;  [facility];  Service: Cardiovascular;  Laterality: N/A;  . MULTIPLE TOOTH EXTRACTIONS  08-29-2011   5 teeth extracted   . pci  01/17/12   RLE  . PERCUTANEOUS STENT INTERVENTION Right 05/17/2011   Procedure: PERCUTANEOUS STENT INTERVENTION;   Surgeon: VanSerafina MitchellD;  [facility];  Service: Cardiovascular;  Laterality: Right;  . PERIPHERAL VASCULAR CATHETERIZATION N/A 06/08/2015   Procedure: Abdominal Aortogram w/Lower Extremity;  Surgeon: ChaElam DutchD;  [facility] LAB;  Service: Cardiovascular;  Laterality: N/A;  . TONSILLECTOMY AND ADENOIDECTOMY    . UMBILICAL HERNIA REPAIR  03/26/2012   Procedure: HERNIA REPAIR UMBILICAL ADULT;  Surgeon: TodRosetta PosnerD;  [facility];  Service: Vascular;  Laterality: N/A;  Removal of Umbilical hernia sac  . UPPER GASTROINTESTINAL ENDOSCOPY  11/2010  . VESICOVAGINAL FISTULA CLOSURE W/ TAH      Current Outpatient Prescriptions  Medication Sig Dispense Refill  . ALPRAZolam (XANAX) 0.25 MG tablet Take 0.25 mg by mouth daily.     . AMarland KitchenTACE 1.25 MG capsule Take 2 capsules (2.5 mg total) by  mouth daily. BRAND NAME ONLY 60 capsule 0  . aspirin EC 81 MG tablet Take 1 tablet (81 mg total) by mouth daily.    . calcium carbonate (TUMS - DOSED IN MG ELEMENTAL CALCIUM) 500 MG chewable tablet Chew 1,000 mg by mouth daily as needed for indigestion or heartburn.    . dexlansoprazole (DEXILANT) 60 MG capsule Take 60 mg by mouth 2 (two) times daily.    . ergocalciferol (VITAMIN D2) 50000 units capsule Take 50,000 Units by mouth 3 (three) times a week.    . furosemide (LASIX) 40 MG tablet Take 40 mg by mouth 2 (two) times daily.    . hydrALAZINE (APRESOLINE) 25 MG tablet Take 1 tablet (25 mg total) by mouth every 8 (eight) hours. 90 tablet 0  . Insulin Degludec (TRESIBA FLEXTOUCH) 100 UNIT/ML SOPN Inject 14 Units into the skin daily.     . iron polysaccharides (NIFEREX) 150 MG capsule Take 450 mg by mouth 2 (two) times daily.     . isosorbide mononitrate (IMDUR) 30 MG 24 hr tablet Take 30 mg by mouth daily.    . Lactobacillus (ACIDOPHILUS PO) Take 1 capsule by mouth daily.    . meperidine (DEMEROL) 50 MG tablet Take 50 mg by mouth every 4 (four) hours as needed for severe  pain.    . metoprolol succinate (TOPROL-XL) 50 MG 24 hr tablet Take 1 tablet (50 mg total) by mouth daily. 30 tablet 1  . nitroGLYCERIN (NITROSTAT) 0.4 MG SL tablet Place 1 tablet (0.4 mg total) under the tongue every 5 (five) minutes as needed. For chest pain 25 tablet 2  . omega-3 acid ethyl esters (LOVAZA) 1 G capsule Take 2 g by mouth 2 (two) times daily.     . pioglitazone-metformin (ACTOPLUS MET) 15-850 MG per tablet Take 1 tablet by mouth daily. Brand name only-does not work if components are given separately per patient 15-850 MG    . rosuvastatin (CRESTOR) 10 MG tablet Take 10 mg by mouth every Monday.     . sitaGLIPtan (JANUVIA) 100 MG tablet Take 100 mg by mouth at bedtime.     . sodium chloride (OCEAN) 0.65 % SOLN nasal spray Place 2-4 sprays into both nostrils every 4 (four) hours as needed (for nose bleeds).     No current facility-administered medications for this visit.     Allergies:   Acetaminophen; Ceftriaxone; Codeine; Erythromycin; Hydrocodone-acetaminophen; Hydromorphone; Propoxyphene hcl; Statins; Eggs or egg-derived products; Ibuprofen; and Shellfish-derived products   Social History:  The patient  reports that she quit smoking about 15 years ago. Her smoking use included Cigarettes. She has a 78.00 pack-year smoking history. She has never used smokeless tobacco. She reports that she drinks about 9.0 oz of alcohol per week . She reports that she does not use drugs.   Family History:  The patient's  family history includes Diabetes in her brother; Heart attack in her father and mother; Hypertension in her brother and brother.    ROS:  Please see the history of present illness.  All other systems are reviewed and negative.    PHYSICAL EXAM: VS:  BP 116/62   Pulse 88   Ht 5' 4"  (1.626 m)   Wt 142 lb (64.4 kg)   BMI 24.37 kg/m  , BMI Body mass index is 24.37 kg/m. GEN: Well nourished, well developed, in no acute distress  HEENT: normal  Neck: no JVD, no masses.  Bilateral carotid bruits Cardiac: RRR without murmur or gallop  Respiratory:  clear to auscultation bilaterally, normal work of breathing GI: soft, nontender, nondistended, + BS MS: no deformity or atrophy  Ext: trace edema on left, right BKA Skin: warm and dry, no rash Neuro:  Strength and sensation are intact Psych: euthymic mood, full affect  EKG:  EKG is ordered today. The ekg ordered today shows NSR 88 bpm, ST T wave abnormality consider lateral ischemia versus repolarization change secondary to hypertrophy  Recent Labs: 12/10/2015: ALT 20 12/12/2015: Magnesium 2.0 12/14/2015: BUN 18; Creatinine, Ser 0.89; Hemoglobin 8.9; Platelets 353; Potassium 3.4; Sodium 137   Lipid Panel     Component Value Date/Time   CHOL 163 07/01/2014 0841   TRIG 148.0 07/01/2014 0841   HDL 57.70 07/01/2014 0841   CHOLHDL 3 07/01/2014 0841   VLDL 29.6 07/01/2014 0841   LDLCALC 76 07/01/2014 0841      Wt Readings from Last 3 Encounters:  07/13/16 142 lb (64.4 kg)  02/23/16 137 lb (62.1 kg)  12/14/15 127 lb 4.8 oz (57.7 kg)     Cardiac Studies Reviewed: 2D Echo: Study Conclusions  - Left ventricle: The cavity size was normal. Systolic function was   normal. The estimated ejection fraction was in the range of 55%   to 60%. There is possible akinesis of the apical myocardium and ?   false tendon in the LV apex vs. thrombus. . The transmitral flow   pattern was not recorded. - Aortic valve: Mildly to moderately calcified annulus. Trileaflet;   mildly thickened, mildly calcified leaflets. - Mitral valve: Moderately calcified annulus. Moderate diffuse   thickening and calcification of the anterior leaflet. The   findings are consistent with moderate stenosis. There was mild   regurgitation. Valve area by continuity equation (using LVOT   flow): 1.52 cm^2. - Tricuspid valve: There was trivial regurgitation.  ASSESSMENT AND PLAN: 1.  CAD, native vessel, without angina: The  patient appears stable and she will continue on her current medical program. She is on an extensive medical regimen and is compliant with her therapy.  2. Chronic diastolic heart failure, stage C: The patient has minimal symptoms at her low activity level essentially with no cardiopulmonary limitation at this point. Her blood pressure is well controlled. Her volume status is good today. She will continue on her current medications.  3. Hypertension with heart failure: Blood pressure well controlled.  4. Peripheral arterial disease: Extensive PAD, followed closely by Dr. Donnetta Hutching.  5. Hyperlipidemia: The patient is treated with low-dose crestor with dose limitation from side effects.  Her lipids are followed by Dr. Forde Dandy.  Current medicines are reviewed with the patient today.  The patient does not have concerns regarding medicines.  Labs/ tests ordered today include:   Orders Placed This Encounter  Procedures  . EKG 12-Lead    Disposition:   FU one year  Signed, Sherren Mocha, MD  07/17/2016 9:10 AM    [facility] [address], [place], [place]  [address] Phone: [phone]; Fax: [phone]

## 2016-07-13 NOTE — Patient Instructions (Signed)

## 2016-07-17 ENCOUNTER — Encounter: Payer: Self-pay | Admitting: Cardiovascular Disease

## 2016-08-08 ENCOUNTER — Encounter (HOSPITAL_BASED_OUTPATIENT_CLINIC_OR_DEPARTMENT_OTHER): Payer: Medicare Other | Attending: Internal Medicine

## 2016-08-08 DIAGNOSIS — I7300 Raynaud's syndrome without gangrene: Secondary | ICD-10-CM | POA: Diagnosis not present

## 2016-08-08 DIAGNOSIS — I10 Essential (primary) hypertension: Secondary | ICD-10-CM | POA: Insufficient documentation

## 2016-08-08 DIAGNOSIS — L97121 Non-pressure chronic ulcer of left thigh limited to breakdown of skin: Secondary | ICD-10-CM | POA: Diagnosis not present

## 2016-08-08 DIAGNOSIS — E1152 Type 2 diabetes mellitus with diabetic peripheral angiopathy with gangrene: Secondary | ICD-10-CM | POA: Diagnosis not present

## 2016-08-08 DIAGNOSIS — L97521 Non-pressure chronic ulcer of other part of left foot limited to breakdown of skin: Secondary | ICD-10-CM | POA: Insufficient documentation

## 2016-08-08 DIAGNOSIS — E11622 Type 2 diabetes mellitus with other skin ulcer: Secondary | ICD-10-CM | POA: Diagnosis present

## 2016-08-29 DIAGNOSIS — E11622 Type 2 diabetes mellitus with other skin ulcer: Secondary | ICD-10-CM | POA: Diagnosis not present

## 2016-09-06 ENCOUNTER — Encounter (HOSPITAL_COMMUNITY): Payer: Medicare Other

## 2016-09-06 ENCOUNTER — Other Ambulatory Visit (HOSPITAL_COMMUNITY): Payer: Medicare Other

## 2016-09-06 ENCOUNTER — Ambulatory Visit: Payer: Medicare Other | Admitting: Vascular Surgery

## 2016-09-19 ENCOUNTER — Encounter (HOSPITAL_BASED_OUTPATIENT_CLINIC_OR_DEPARTMENT_OTHER): Payer: Medicare Other | Attending: Internal Medicine

## 2016-09-19 DIAGNOSIS — Z09 Encounter for follow-up examination after completed treatment for conditions other than malignant neoplasm: Secondary | ICD-10-CM | POA: Insufficient documentation

## 2016-09-19 DIAGNOSIS — Z872 Personal history of diseases of the skin and subcutaneous tissue: Secondary | ICD-10-CM | POA: Insufficient documentation

## 2016-09-19 DIAGNOSIS — E119 Type 2 diabetes mellitus without complications: Secondary | ICD-10-CM | POA: Insufficient documentation

## 2016-10-13 ENCOUNTER — Encounter: Payer: Self-pay | Admitting: Vascular Surgery

## 2016-10-25 ENCOUNTER — Ambulatory Visit (HOSPITAL_COMMUNITY)
Admission: RE | Admit: 2016-10-25 | Discharge: 2016-10-25 | Disposition: A | Discharge status: Home / Self Care | Payer: Medicare Other | Source: Ambulatory Visit | Attending: Vascular Surgery | Admitting: Vascular Surgery

## 2016-10-25 ENCOUNTER — Ambulatory Visit (INDEPENDENT_AMBULATORY_CARE_PROVIDER_SITE_OTHER)
Admission: RE | Admit: 2016-10-25 | Discharge: 2016-10-25 | Disposition: A | Discharge status: Home / Self Care | Payer: Medicare Other | Source: Ambulatory Visit | Attending: Vascular Surgery | Admitting: Vascular Surgery

## 2016-10-25 ENCOUNTER — Encounter: Payer: Self-pay | Admitting: Vascular Surgery

## 2016-10-25 ENCOUNTER — Ambulatory Visit (INDEPENDENT_AMBULATORY_CARE_PROVIDER_SITE_OTHER): Payer: Medicare Other | Admitting: Vascular Surgery

## 2016-10-25 VITALS — BP 127/72 | HR 76 | Temp 97.8°F | Resp 16 | Ht 64.0 in | Wt 159.0 lb

## 2016-10-25 DIAGNOSIS — I779 Disorder of arteries and arterioles, unspecified: Secondary | ICD-10-CM | POA: Insufficient documentation

## 2016-10-25 NOTE — Progress Notes (Signed)
[facility] [place]  Patient name: [name] MRN: [id number] DOB: 1946-03-12 Sex: female  REASON FOR VISIT: Follow up severe peripheral vascular occlusive disease  HPI: [name] is a 71 y.o. female here today for follow-up. She looks quite good today. She does report that she has had some difficulty regarding her right BKA prosthesis and is requested a reevaluation which we will coordinate. Also would like some additional physical therapy. Her left foot on the bilateral visit with her in 6 months ago had the dry gangrenous changes of her second and third toe and I predicted that these would auto amputate which they have. She does not have any arterial rest pain.  Past Medical History:  Diagnosis Date  . Anemia    hx  . Anxiety   . Carotid artery occlusion   . CHF (congestive heart failure) (HCC)   . Constipation   . Coronary artery disease    a. s/p NSTEMI 02/2009 - PCI LCX with Xience DES. Otherwise branch vessel and Dist. RCA dzs. NL EF.  b. 2013: cath showing patency of LCx stent and mild nonobstructive dz in LAD; normal LV function  . Diabetic neuropathy with neurologic complication (HCC)   . Fall from slipping on wet surface Jan. 7, 2014   Right stump bleeding from fall  . GERD (gastroesophageal reflux disease)   . Goiter   . Hernia, umbilical   . History of blood transfusion   . History of right below knee amputation (HCC)   . Hyperlipidemia    Mixed  . Hypertension    Unspecified  . Kidney stones   . Loose, teeth    has loose bridge and two loose teeth holding it  . Myocardial infarction (HCC)   . Peripheral vascular disease (HCC)    unspecified, a. s/p L CEA b. s/p B fem-pop bypass c. s/p R BKA  . PONV (postoperative nausea and vomiting)   . Proliferative diabetic retinopathy of right eye (HCC)   . Raynaud disease   . Stones in the urinary tract   . Subclavian steal syndrome   . Tobacco abuse      Remote  . Type II diabetes mellitus (HCC)   . UTI (lower urinary tract infection)   . UTI (urinary tract infection)   . Vitamin D deficiency     Family History  Problem Relation Age of Onset  . Heart attack Mother   . Heart attack Father   . Hypertension Brother   . Diabetes Brother   . Hypertension Brother     SOCIAL HISTORY: Social History  Substance Use Topics  . Smoking status: Former Smoker    Packs/day: 2.00    Years: 39.00    Types: Cigarettes    Quit date: 07/05/2001  . Smokeless tobacco: Never Used  . Alcohol use 9.0 oz/week    1 Shots of liquor, 14 Standard drinks or equivalent per week     Comment: burbon     Allergies  Allergen Reactions  . Acetaminophen Nausea Only and Other (See Comments)    Does not tolerate well, nausea  . Ceftriaxone Itching    Pt has tolerated amoxicillin and cephalexin in the past.  . Codeine Nausea And Vomiting  . Erythromycin Nausea And Vomiting  . Hydrocodone-Acetaminophen Nausea And Vomiting  . Hydromorphone Nausea And Vomiting  . Propoxyphene Hcl Nausea And Vomiting  . Statins Other (See Comments)    Leg myalgias  . Eggs Or Egg-Derived Products Other (  See Comments)    "pt does not eat" Unspecified reaction  . Ibuprofen Other (See Comments)    Unspecified  Per Pt "Does not tolerate well"  . Shellfish-Derived Products Other (See Comments)    "pt does not eat" Unspecified reaction    Current Outpatient Prescriptions  Medication Sig Dispense Refill  . ALPRAZolam (XANAX) 0.25 MG tablet Take 0.25 mg by mouth daily.     Marland Kitchen aspirin EC 81 MG tablet Take 1 tablet (81 mg total) by mouth daily.    . calcium carbonate (TUMS - DOSED IN MG ELEMENTAL CALCIUM) 500 MG chewable tablet Chew 1,000 mg by mouth daily as needed for indigestion or heartburn.    . dexlansoprazole (DEXILANT) 60 MG capsule Take 60 mg by mouth 2 (two) times daily.    . ergocalciferol (VITAMIN D2) 50000 units capsule Take 50,000 Units by mouth 3 (three) times a  week.    . furosemide (LASIX) 40 MG tablet Take 40 mg by mouth 2 (two) times daily.    . hydrALAZINE (APRESOLINE) 25 MG tablet Take 1 tablet (25 mg total) by mouth every 8 (eight) hours. 90 tablet 0  . Insulin Degludec (TRESIBA FLEXTOUCH) 100 UNIT/ML SOPN Inject 14 Units into the skin daily.     . iron polysaccharides (NIFEREX) 150 MG capsule Take 600 mg by mouth 2 (two) times daily.     . isosorbide mononitrate (IMDUR) 30 MG 24 hr tablet Take 30 mg by mouth daily.    . Lactobacillus (ACIDOPHILUS PO) Take 1 capsule by mouth daily.    . meperidine (DEMEROL) 50 MG tablet Take 50 mg by mouth every 4 (four) hours as needed for severe pain.    . metoprolol succinate (TOPROL-XL) 50 MG 24 hr tablet Take 1 tablet (50 mg total) by mouth daily. 30 tablet 1  . nitroGLYCERIN (NITROSTAT) 0.4 MG SL tablet Place 1 tablet (0.4 mg total) under the tongue every 5 (five) minutes as needed. For chest pain 25 tablet 2  . omega-3 acid ethyl esters (LOVAZA) 1 G capsule Take 2 g by mouth 2 (two) times daily.     . ramipril (ALTACE) 1.25 MG capsule Take 1.25 mg by mouth daily.    . rosuvastatin (CRESTOR) 10 MG tablet Take 10 mg by mouth every Monday.     . sodium chloride (OCEAN) 0.65 % SOLN nasal spray Place 2-4 sprays into both nostrils every 4 (four) hours as needed (for nose bleeds).     No current facility-administered medications for this visit.     REVIEW OF SYSTEMS:  [X]  denotes positive finding, [ ]  denotes negative finding Cardiac  Comments:  Chest pain or chest pressure:    Shortness of breath upon exertion:    Short of breath when lying flat:    Irregular heart rhythm:        Vascular    Pain in calf, thigh, or hip brought on by ambulation:    Pain in feet at night that wakes you up from your sleep:     Blood clot in your veins:    Leg swelling:  x         PHYSICAL EXAM: Vitals:   10/25/16 1101  BP: 127/72  Pulse: 76  Resp: 16  Temp: 97.8 F (36.6 C)  TempSrc: Oral  SpO2: 98%  Weight:  159 lb (72.1 kg)  Height: 5\' 4"  (1.626 m)    GENERAL: The patient is a well-nourished female, in no acute distress. The vital signs are documented  above. CARDIOVASCULAR: Palpable radial pulses. I do not palpate a left popliteal or pedal pulse. PULMONARY: There is good air exchange  MUSCULOSKELETAL: Right below-knee amputation. Left second and third toes have auto amputated and are healed. NEUROLOGIC: No focal weakness or paresthesias are detected. SKIN: There are no ulcers or rashes noted. PSYCHIATRIC: The patient has a normal affect.  DATA:  Noninvasive vascular studies reveal ankle arm index of 0.42 on the left with monophasic waveform in the posterior tibial artery. Duplex reveals that the left limb of her aortofemoral bypass is patent also her left femoral to above-knee popliteal bypass and an extension from the above-knee popliteal to below-knee popliteal are all patent. There is some suggestion of slight narrowing in the distal extent of the extension. This is unchanged from her last study  MEDICAL ISSUES: Stable overall. We will see her again in 6 months with repeat studies. Will coordinate evaluation with physical therapy and with potential new prosthesis as well.    Larina Earthly, MD FACS [facility] Tel [phone] Pager [phone]

## 2016-10-26 NOTE — Addendum Note (Signed)
Addended by: Burton Apley A on: 10/26/2016 09:17 AM   Modules accepted: Orders

## 2017-03-21 ENCOUNTER — Encounter: Payer: Self-pay | Admitting: Family

## 2017-03-21 ENCOUNTER — Ambulatory Visit (INDEPENDENT_AMBULATORY_CARE_PROVIDER_SITE_OTHER)
Admission: RE | Admit: 2017-03-21 | Discharge: 2017-03-21 | Disposition: A | Discharge status: Home / Self Care | Payer: Medicare Other | Source: Ambulatory Visit | Attending: Vascular Surgery | Admitting: Vascular Surgery

## 2017-03-21 ENCOUNTER — Ambulatory Visit (HOSPITAL_COMMUNITY)
Admission: RE | Admit: 2017-03-21 | Discharge: 2017-03-21 | Disposition: A | Discharge status: Home / Self Care | Payer: Medicare Other | Source: Ambulatory Visit | Attending: Vascular Surgery | Admitting: Vascular Surgery

## 2017-03-21 DIAGNOSIS — I779 Disorder of arteries and arterioles, unspecified: Secondary | ICD-10-CM | POA: Diagnosis present

## 2017-03-22 ENCOUNTER — Encounter: Payer: Self-pay | Admitting: Family

## 2017-03-22 ENCOUNTER — Ambulatory Visit (INDEPENDENT_AMBULATORY_CARE_PROVIDER_SITE_OTHER): Payer: Medicare Other | Admitting: Family

## 2017-03-22 VITALS — BP 114/84 | HR 77 | Temp 97.0°F | Resp 16 | Ht 64.0 in | Wt 164.2 lb

## 2017-03-22 DIAGNOSIS — Z89511 Acquired absence of right leg below knee: Secondary | ICD-10-CM

## 2017-03-22 DIAGNOSIS — Z9889 Other specified postprocedural states: Secondary | ICD-10-CM | POA: Diagnosis not present

## 2017-03-22 DIAGNOSIS — I779 Disorder of arteries and arterioles, unspecified: Secondary | ICD-10-CM | POA: Diagnosis not present

## 2017-03-22 DIAGNOSIS — Z9862 Peripheral vascular angioplasty status: Secondary | ICD-10-CM | POA: Diagnosis not present

## 2017-03-22 NOTE — Patient Instructions (Signed)

## 2017-03-22 NOTE — Progress Notes (Signed)
[facility] [place]   CC: Follow up peripheral artery occlusive disease  History of Present Illness [name] is a 71 y.o. 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  She also has a hx of right BKA; she is wearing her prosthesis which fits well now. She states her BKA wounds have healed well.   Dr. Arbie Cookey last evaluated pt on 10-25-16. At that time she reported that she has had some difficulty regarding her right BKA prosthesis and requested a reevaluation which was to be coordinated along with some additional physical therapy. Her left foot on the visit 6 months prior had the dry gangrenous changes of her second and third toe and Dr. Arbie Cookey predicted that these would auto amputate which they had. She did not have any arterial rest pain. Right below-knee amputation. Left second and third toes have auto amputated and are healed. Noninvasive vascular studies revealed ankle arm index of 0.42 on the left with monophasic waveform in the posterior tibial artery. Duplex revealed that the left limb of her aortofemoral bypass was patent; also her left femoral to above-knee popliteal bypass and an extension from the above-knee popliteal to below-knee popliteal are all patent. There is some suggestion of slight narrowing in the distal extent of the extension. This is unchanged from her previous study. Stable overall. Dr. Arbie Cookey advised pt to return in 6 months with repeat studies. Will coordinate evaluation with physical therapy and with potential new prosthesis as well.  Pt walks some with her walker.  Pt denies any stroke or TIA history.  Pt is wearing right BKA prosthesis.  Pt Diabetic: Yes, last A1C result on file was 6.6 in February 2017 (review of records) Pt smoker: former smoker, quit in 2003   Pt meds include:  Statin :Yes, takes 10 mg Crestor once/week due to  myalgias  ASA: Yes  Other anticoagulants/antiplatelets: Plavix    Past Medical History:  Diagnosis Date  . Anemia    hx  . Anxiety   . Carotid artery occlusion   . CHF (congestive heart failure) (HCC)   . Constipation   . Coronary artery disease    a. s/p NSTEMI 02/2009 - PCI LCX with Xience DES. Otherwise branch vessel and Dist. RCA dzs. NL EF.  b. 2013: cath showing patency of LCx stent and mild nonobstructive dz in LAD; normal LV function  . Diabetic neuropathy with neurologic complication (HCC)   . Fall from slipping on wet surface Jan. 7, 2014   Right stump bleeding from fall  . GERD (gastroesophageal reflux disease)   . Goiter   . Hernia, umbilical   . History of blood transfusion   . History of right below knee amputation (HCC)   . Hyperlipidemia    Mixed  . Hypertension    Unspecified  . Kidney stones   . Loose, teeth    has loose bridge and two loose teeth holding it  . Myocardial infarction (HCC)   . Peripheral vascular disease (HCC)    unspecified, a. s/p L CEA b. s/p B fem-pop bypass c. s/p R BKA  . PONV (postoperative nausea and vomiting)   . Proliferative diabetic retinopathy of right eye (HCC)   . Raynaud disease   . Stones in the urinary tract   . Subclavian steal syndrome   . Tobacco abuse    Remote  . Type II diabetes mellitus (HCC)   .  UTI (lower urinary tract infection)   . UTI (urinary tract infection)   . Vitamin D deficiency     Social History Social History  Substance Use Topics  . Smoking status: Former Smoker    Packs/day: 2.00    Years: 39.00    Types: Cigarettes    Quit date: 07/05/2001  . Smokeless tobacco: Never Used     Comment: Quit 15 years ago  . Alcohol use 9.0 oz/week    1 Shots of liquor, 14 Standard drinks or equivalent per week     Comment: burbon     Family History Family History  Problem Relation Age of Onset  . Heart attack Mother   . Heart attack Father   . Hypertension Brother   . Diabetes Brother   .  Hypertension Brother     Past Surgical History:  Procedure Laterality Date  . ABDOMINAL ANGIOGRAM  09/21/2011   Procedure: ABDOMINAL ANGIOGRAM;  Surgeon: Nada Libman, MD;  Location: [facility] CATH LAB;  Service: Cardiovascular;;  . ABDOMINAL AORTAGRAM N/A 05/17/2011   Procedure: ABDOMINAL Ronny Flurry;  Surgeon: Nada Libman, MD;  Location: [facility] CATH LAB;  Service: Cardiovascular;  Laterality: N/A;  . ABDOMINAL AORTAGRAM N/A 01/17/2012   Procedure: ABDOMINAL Ronny Flurry;  Surgeon: Nada Libman, MD;  Location: [facility] CATH LAB;  Service: Cardiovascular;  Laterality: N/A;  . ABDOMINAL HYSTERECTOMY     complete  . AMPUTATION  04/23/2012   Procedure: AMPUTATION BELOW KNEE;  Surgeon: Larina Earthly, MD;  Location: [facility] OR;  Service: Vascular;  Laterality: Right;  . AORTA - BILATERAL FEMORAL ARTERY BYPASS GRAFT  03/26/2012   Procedure: AORTA BIFEMORAL BYPASS GRAFT;  Surgeon: Larina Earthly, MD;  Location: [facility] OR;  Service: Vascular;  Laterality: N/A;  Aortic-bifemoral bypass using 14x37mm Hemashield graft .   Marland Kitchen BREAST LUMPECTOMY     right  . CAROTID ENDARTERECTOMY  ~ 2005   left  . COLONOSCOPY  11/2010  . CORONARY ANGIOPLASTY WITH STENT PLACEMENT    . EYE SURGERY Right Feb. 9, 2015   Cataract  . EYE SURGERY Left Feb. 16, 2015   Cataract  . FEMORAL ARTERY - POPLITEAL ARTERY BYPASS GRAFT     left  . FEMORAL ARTERY EXPLORATION  03/26/2012   Procedure: FEMORAL ARTERY EXPLORATION;  Surgeon: Larina Earthly, MD;  Location: [facility] OR;  Service: Vascular;  Laterality: Right;  with Revision of Popliteal-Peroneal bypass graft using 19mm x 10cm thin wall goretex graft  . FEMORAL-POPLITEAL BYPASS GRAFT  08/22/2011   Procedure: BYPASS GRAFT FEMORAL-POPLITEAL ARTERY;  Surgeon: Gretta Began, MD;  Location: [facility] OR;  Service: Vascular;  Laterality: Right;  Thrombectomy and Revision using 12mm x 10cm stretch goretex graft  . FEMORAL-TIBIAL BYPASS GRAFT Left 08/19/2015   Procedure: LEFT FEMORAL-  ABOVE KNEE POPLITEAL BYPASS GRAFT (VEIN) to  BELOW KNEE POPLITEAL ARTERY BYPASS WITH SMALL SAPHENOUS VEIN GRAFT;  Surgeon: Larina Earthly, MD;  Location: [facility] OR;  Service: Vascular;  Laterality: Left;  . HERNIA REPAIR    . INTRAOPERATIVE ARTERIOGRAM  08/22/2011   Procedure: INTRA OPERATIVE ARTERIOGRAM;  Surgeon: Gretta Began, MD;  Location: [facility] OR;  Service: Vascular;  Laterality: Right;  to lower leg  . LEFT HEART CATHETERIZATION WITH CORONARY ANGIOGRAM N/A 04/05/2012   Procedure: LEFT HEART CATHETERIZATION WITH CORONARY ANGIOGRAM;  Surgeon: Tonny Bollman, MD;  Location: [facility] CATH LAB;  Service: Cardiovascular;  Laterality: N/A;  . LOWER EXTREMITY ANGIOGRAM  05/17/2011   Procedure: LOWER EXTREMITY ANGIOGRAM;  Surgeon: Nada Libman,  MD;  Location: [facility] CATH LAB;  Service: Cardiovascular;;  . LOWER EXTREMITY ANGIOGRAM N/A 09/21/2011   Procedure: LOWER EXTREMITY ANGIOGRAM;  Surgeon: Nada Libman, MD;  Location: [facility] CATH LAB;  Service: Cardiovascular;  Laterality: N/A;  . MULTIPLE TOOTH EXTRACTIONS  08-29-2011   5 teeth extracted   . pci  01/17/12   RLE  . PERCUTANEOUS STENT INTERVENTION Right 05/17/2011   Procedure: PERCUTANEOUS STENT INTERVENTION;  Surgeon: Nada Libman, MD;  Location: [facility] CATH LAB;  Service: Cardiovascular;  Laterality: Right;  . PERIPHERAL VASCULAR CATHETERIZATION N/A 06/08/2015   Procedure: Abdominal Aortogram w/Lower Extremity;  Surgeon: Sherren Kerns, MD;  Location: [facility] INVASIVE CV LAB;  Service: Cardiovascular;  Laterality: N/A;  . TONSILLECTOMY AND ADENOIDECTOMY    . UMBILICAL HERNIA REPAIR  03/26/2012   Procedure: HERNIA REPAIR UMBILICAL ADULT;  Surgeon: Larina Earthly, MD;  Location: [facility] OR;  Service: Vascular;  Laterality: N/A;  Removal of Umbilical hernia sac  . UPPER GASTROINTESTINAL ENDOSCOPY  11/2010  . VESICOVAGINAL FISTULA CLOSURE W/ TAH      Allergies  Allergen Reactions  . Acetaminophen Nausea Only and Other (See Comments)    Does not tolerate well, nausea  . Ceftriaxone Itching    Pt has tolerated  amoxicillin and cephalexin in the past.  . Codeine Nausea And Vomiting  . Erythromycin Nausea And Vomiting  . Hydrocodone-Acetaminophen Nausea And Vomiting  . Hydromorphone Nausea And Vomiting  . Propoxyphene Hcl Nausea And Vomiting  . Statins Other (See Comments)    Leg myalgias  . Eggs Or Egg-Derived Products Other (See Comments)    "pt does not eat" Unspecified reaction  . Ibuprofen Other (See Comments)    Unspecified  Per Pt "Does not tolerate well"  . Shellfish-Derived Products Other (See Comments)    "pt does not eat" Unspecified reaction    Current Outpatient Prescriptions  Medication Sig Dispense Refill  . ALPRAZolam (XANAX) 0.25 MG tablet Take 0.25 mg by mouth daily.     Marland Kitchen aspirin EC 81 MG tablet Take 1 tablet (81 mg total) by mouth daily.    . calcium carbonate (TUMS - DOSED IN MG ELEMENTAL CALCIUM) 500 MG chewable tablet Chew 1,000 mg by mouth daily as needed for indigestion or heartburn.    . dexlansoprazole (DEXILANT) 60 MG capsule Take 60 mg by mouth 2 (two) times daily.    . ergocalciferol (VITAMIN D2) 50000 units capsule Take 50,000 Units by mouth 3 (three) times a week.    . furosemide (LASIX) 40 MG tablet Take 40 mg by mouth 2 (two) times daily.    . hydrALAZINE (APRESOLINE) 25 MG tablet Take 1 tablet (25 mg total) by mouth every 8 (eight) hours. 90 tablet 0  . Insulin Degludec (TRESIBA FLEXTOUCH) 100 UNIT/ML SOPN Inject 14 Units into the skin daily.     . iron polysaccharides (NIFEREX) 150 MG capsule Take 600 mg by mouth 2 (two) times daily.     . isosorbide mononitrate (IMDUR) 30 MG 24 hr tablet Take 30 mg by mouth daily.    . Lactobacillus (ACIDOPHILUS PO) Take 1 capsule by mouth daily.    . meperidine (DEMEROL) 50 MG tablet Take 50 mg by mouth every 4 (four) hours as needed for severe pain.    . metoprolol succinate (TOPROL-XL) 50 MG 24 hr tablet Take 1 tablet (50 mg total) by mouth daily. 30 tablet 1  . nitroGLYCERIN (NITROSTAT) 0.4 MG SL tablet Place 1 tablet  (0.4 mg total) under the tongue  every 5 (five) minutes as needed. For chest pain 25 tablet 2  . omega-3 acid ethyl esters (LOVAZA) 1 G capsule Take 2 g by mouth 2 (two) times daily.     . ramipril (ALTACE) 1.25 MG capsule Take 1.25 mg by mouth daily.    . rosuvastatin (CRESTOR) 10 MG tablet Take 10 mg by mouth every Monday.     . sodium chloride (OCEAN) 0.65 % SOLN nasal spray Place 2-4 sprays into both nostrils every 4 (four) hours as needed (for nose bleeds).     No current facility-administered medications for this visit.     ROS: See HPI for pertinent positives and negatives.   Physical Examination  Vitals:   03/22/17 1534  BP: 114/84  Pulse: 77  Resp: 16  Temp: (!) 97 F (36.1 C)  TempSrc: Oral  SpO2: 97%  Weight: 164 lb 3.2 oz (74.5 kg)  Height:  (1.626 m)   Body mass index is 28.18 kg/m.  General: A&O x 3, WDWN. Gait: not observed, pt arrived in wheelchair Eyes: PERRLA. Pulmonary: non labored respirations Cardiac: regular rhythm and rate.         Carotid Bruits Right Left   Negative Positive   Abdominal aortic pulse is not palpable.  Radial pulses: absent left radial and ulnar , 1+ left brachial; 1+ right radial and ulnar, 2+ right brachial                         VASCULAR EXAM: Extremities with ischemic changes: moist clean ulcer at medial aspect left second toe. Small ulcer at lateral aspect third toe. See photos below.  Right BKA prosthesis in place.  Left foot    Left foot                                                                                                                                                          LE Pulses Right Left       FEMORAL  2+ palpable  2+ palpable        POPLITEAL  not palpable   not palpable       POSTERIOR TIBIAL  BKA   not palpable        DORSALIS PEDIS      ANTERIOR TIBIAL BKA  Not palpable   Abdomen: soft, NT, no palpable masses. Skin: no rashes, see Extremities Musculoskeletal:  moving all extremities WNL, see Extremities.         Neurologic: A&O X 3; Appropriate Affect  motor strength 5/5 in UE's, 4/5 in LE's. Speech is fluent/normal. CN 2-12 intact.    ASSESSMENT: [name] is a 71 y.o. female who is s/p left above-knee to below-knee popliteal bypass with reversed small saphenous vein on 08/19/2015. She has a right BKA  and is wearing her prosthesis.  Moist clean ulcer at medial aspect left second toe. Small ulcer at lateral aspect third toe.  Dr. Arbie Cookey spoke with pt and husband and examined pt; see Plan.   DATA  Left LE Arterial Duplex (03/22/17):  Patent left limb of the aortobifemoral bypass graft without evidence of stenosis. Sub optimal visualization of the distal graft segment due to vessel depth and size. Outflow artery was difficult to visualize.  >70% stenosis of the mid to distal segment of the below the knee bypass graft segment (351 cm/s, was >400 cm/s). No significant change compared to the exam on 10-25-16.   ABI (Date: 03/22/2017):  R: BKA   L:   ABI: 0.49 (was 0.42 on 10-25-16),    PLAN:  Soak left foot in warm water and liquid antibacterial soap daily for 20 minutes. Then apply damp to dry NS gauze dressing between toes.  Based on the patient's vascular studies and examination, pt will return to clinic in 1 month, no testing necessary, see Dr. Arbie Cookey for follow up check of left toe ulcers.   I discussed in depth with the patient the nature of atherosclerosis, and emphasized the importance of maximal medical management including strict control of blood pressure, blood glucose, and lipid levels, obtaining regular exercise, and continued cessation of smoking.  The patient is aware that without maximal medical management the underlying atherosclerotic disease process will progress, limiting the benefit of any interventions.  The patient was given information about PAD including signs, symptoms, treatment, what symptoms should prompt  the patient to seek immediate medical care, and risk reduction measures to take.  Charisse March, RN, MSN, FNP-C [facility] [place] Phone: [phone]  Clinic MD: Edilia Bo  03/22/17 3:48 PM

## 2017-03-22 NOTE — Progress Notes (Signed)
Foot/toes cleaned with Saf-Clens. Wet to dry saline dressing with gauze between each toe applied and wrap with Kerlix. Spouse verbalized understanding on how to do dressing changes at home.

## 2017-04-25 ENCOUNTER — Other Ambulatory Visit: Payer: Self-pay | Admitting: Vascular Surgery

## 2017-04-25 ENCOUNTER — Ambulatory Visit (INDEPENDENT_AMBULATORY_CARE_PROVIDER_SITE_OTHER): Payer: Medicare Other | Admitting: Vascular Surgery

## 2017-04-25 ENCOUNTER — Encounter: Payer: Self-pay | Admitting: Vascular Surgery

## 2017-04-25 DIAGNOSIS — I779 Disorder of arteries and arterioles, unspecified: Secondary | ICD-10-CM

## 2017-04-25 DIAGNOSIS — I70219 Atherosclerosis of native arteries of extremities with intermittent claudication, unspecified extremity: Secondary | ICD-10-CM | POA: Diagnosis not present

## 2017-04-25 MED ORDER — RESTORE HYDROGEL DRESSING EX GEL: CUTANEOUS | 1 refills | Status: DC

## 2017-04-25 NOTE — Progress Notes (Signed)
[facility] [place]  Patient name: [name] MRN: [id number] DOB: [birth year] Sex: female  REASON FOR VISIT: Follow-up left foot  HPI: [name] is a 71 y.o. female here today for follow-up.  Been seen a month ago where she had autoamputation of gangrenous toes from her left foot.  She is here today to assure that these are healing.  She has been doing soaking and wet-to-dry dressings and has had very nice result.  The area of ulceration is even contracted over what it had before.  Past Medical History:  Diagnosis Date  . Anemia    hx  . Anxiety   . Carotid artery occlusion   . CHF (congestive heart failure) (HCC)   . Constipation   . Coronary artery disease    a. s/p NSTEMI 02/2009 - PCI LCX with Xience DES. Otherwise branch vessel and Dist. RCA dzs. NL EF.  b. 2013: cath showing patency of LCx stent and mild nonobstructive dz in LAD; normal LV function  . Diabetic neuropathy with neurologic complication (HCC)   . Fall from slipping on wet surface Jan. 7, 2014   Right stump bleeding from fall  . GERD (gastroesophageal reflux disease)   . Goiter   . Hernia, umbilical   . History of blood transfusion   . History of right below knee amputation (HCC)   . Hyperlipidemia    Mixed  . Hypertension    Unspecified  . Kidney stones   . Loose, teeth    has loose bridge and two loose teeth holding it  . Myocardial infarction (HCC)   . Peripheral vascular disease (HCC)    unspecified, a. s/p L CEA b. s/p B fem-pop bypass c. s/p R BKA  . PONV (postoperative nausea and vomiting)   . Proliferative diabetic retinopathy of right eye (HCC)   . Raynaud disease   . Stones in the urinary tract   . Subclavian steal syndrome   . Tobacco abuse    Remote  . Type II diabetes mellitus (HCC)   . UTI (lower urinary tract infection)   . UTI (urinary tract infection)   . Vitamin D deficiency     Family History  Problem Relation  Age of Onset  . Heart attack Mother   . Heart attack Father   . Hypertension Brother   . Diabetes Brother   . Hypertension Brother     SOCIAL HISTORY: Social History  Substance Use Topics  . Smoking status: Former Smoker    Packs/day: 2.00    Years: 39.00    Types: Cigarettes    Quit date: 07/05/2001  . Smokeless tobacco: Never Used     Comment: Quit 15 years ago  . Alcohol use 9.0 oz/week    1 Shots of liquor, 14 Standard drinks or equivalent per week     Comment: burbon     Allergies  Allergen Reactions  . Acetaminophen Nausea Only and Other (See Comments)    Does not tolerate well, nausea  . Ceftriaxone Itching    Pt has tolerated amoxicillin and cephalexin in the past.  . Codeine Nausea And Vomiting  . Erythromycin Nausea And Vomiting  . Hydrocodone-Acetaminophen Nausea And Vomiting  . Hydromorphone Nausea And Vomiting  . Propoxyphene Hcl Nausea And Vomiting  . Statins Other (See Comments)    Leg myalgias  . Eggs Or Egg-Derived Products Other (See Comments)    "pt does not eat" Unspecified reaction  . Ibuprofen Other (See Comments)  Unspecified  Per Pt "Does not tolerate well"  . Shellfish-Derived Products Other (See Comments)    "pt does not eat" Unspecified reaction    Current Outpatient Prescriptions  Medication Sig Dispense Refill  . ALPRAZolam (XANAX) 0.25 MG tablet Take 0.25 mg by mouth daily.     Marland Kitchen aspirin EC 81 MG tablet Take 1 tablet (81 mg total) by mouth daily.    . calcium carbonate (TUMS - DOSED IN MG ELEMENTAL CALCIUM) 500 MG chewable tablet Chew 1,000 mg by mouth daily as needed for indigestion or heartburn.    . dexlansoprazole (DEXILANT) 60 MG capsule Take 60 mg by mouth 2 (two) times daily.    . ergocalciferol (VITAMIN D2) 50000 units capsule Take 50,000 Units by mouth 3 (three) times a week.    . furosemide (LASIX) 40 MG tablet Take 40 mg by mouth 2 (two) times daily.    . Insulin Degludec (TRESIBA FLEXTOUCH) 100 UNIT/ML SOPN Inject 14  Units into the skin daily.     . iron polysaccharides (NIFEREX) 150 MG capsule Take 600 mg by mouth 2 (two) times daily.     . isosorbide mononitrate (IMDUR) 30 MG 24 hr tablet Take 30 mg by mouth daily.    . Lactobacillus (ACIDOPHILUS PO) Take 1 capsule by mouth daily.    . meperidine (DEMEROL) 50 MG tablet Take 50 mg by mouth every 4 (four) hours as needed for severe pain.    . metFORMIN (GLUCOPHAGE) 500 MG tablet Take by mouth 2 (two) times daily with a meal.    . metoprolol succinate (TOPROL-XL) 50 MG 24 hr tablet Take 1 tablet (50 mg total) by mouth daily. 30 tablet 1  . nitroGLYCERIN (NITROSTAT) 0.4 MG SL tablet Place 1 tablet (0.4 mg total) under the tongue every 5 (five) minutes as needed. For chest pain 25 tablet 2  . omega-3 acid ethyl esters (LOVAZA) 1 G capsule Take 2 g by mouth 2 (two) times daily.     . prochlorperazine (COMPAZINE) 10 MG tablet Take 10 mg by mouth every 6 (six) hours as needed for nausea or vomiting.    . ramipril (ALTACE) 1.25 MG capsule Take 1.25 mg by mouth daily.    . rosuvastatin (CRESTOR) 10 MG tablet Take 10 mg by mouth every Monday.     . sodium chloride (OCEAN) 0.65 % SOLN nasal spray Place 2-4 sprays into both nostrils every 4 (four) hours as needed (for nose bleeds).    Marland Kitchen sulfamethoxazole-trimethoprim (BACTRIM DS,SEPTRA DS) 800-160 MG tablet Take 1 tablet by mouth 2 (two) times daily.     No current facility-administered medications for this visit.     REVIEW OF SYSTEMS:  [X]  denotes positive finding, [ ]  denotes negative finding Cardiac  Comments:  Chest pain or chest pressure:    Shortness of breath upon exertion:    Short of breath when lying flat:    Irregular heart rhythm:        Vascular    Pain in calf, thigh, or hip brought on by ambulation:    Pain in feet at night that wakes you up from your sleep:     Blood clot in your veins:    Leg swelling:  x         PHYSICAL EXAM: There were no vitals filed for this visit.  GENERAL: The  patient is a well-nourished female, in no acute distress. The vital signs are documented above. CARDIOVASCULAR: Palpable pulse in her foot.  Amputation on her  right foot.  She does have Doppler flow in her posterior tibial with a well-perfused foot PULMONARY: There is good air exchange  MUSCULOSKELETAL: There are no major deformities or cyanosis. NEUROLOGIC: No focal weakness or paresthesias are detected. SKIN: Superficial ulceration on her second and third toe where she had autoamputation of dry gangrenous changes PSYCHIATRIC: The patient has a normal affect.  DATA:  No new labs  MEDICAL ISSUES: Overall.  Will switch to hydrogel these open ulcerations.  We will continue her usual activity we will see her in 6 months with noninvasive studies    Larina Earthlyodd F. Loni Delbridge, MD [facility] Office Tel [phone] Pager [phone]

## 2017-05-02 ENCOUNTER — Ambulatory Visit: Payer: Medicare Other | Admitting: Vascular Surgery

## 2017-05-02 ENCOUNTER — Encounter (HOSPITAL_COMMUNITY): Payer: Medicare Other

## 2017-08-17 ENCOUNTER — Encounter: Payer: Self-pay | Admitting: Cardiovascular Disease

## 2017-08-24 ENCOUNTER — Ambulatory Visit (INDEPENDENT_AMBULATORY_CARE_PROVIDER_SITE_OTHER): Payer: Medicare Other | Admitting: Cardiovascular Disease

## 2017-08-24 ENCOUNTER — Encounter: Payer: Self-pay | Admitting: Cardiovascular Disease

## 2017-08-24 VITALS — BP 122/84 | HR 87 | Ht 64.0 in | Wt 164.0 lb

## 2017-08-24 DIAGNOSIS — E785 Hyperlipidemia, unspecified: Secondary | ICD-10-CM

## 2017-08-24 DIAGNOSIS — I739 Peripheral vascular disease, unspecified: Secondary | ICD-10-CM | POA: Diagnosis not present

## 2017-08-24 DIAGNOSIS — I10 Essential (primary) hypertension: Secondary | ICD-10-CM | POA: Diagnosis not present

## 2017-08-24 DIAGNOSIS — I2510 Atherosclerotic heart disease of native coronary artery without angina pectoris: Secondary | ICD-10-CM

## 2017-08-24 NOTE — Patient Instructions (Signed)

## 2017-08-24 NOTE — Progress Notes (Signed)
Cardiology Office Note Date:  08/26/2017   ID:  [name], [place] Meo Feb 12, 1946, MRN [id number]  PCP:  Adrian Prince, MD  Cardiologist:  Tonny Bollman, MD    Chief Complaint  Patient presents with  . Follow-up    CAD     History of Present Illness: [name] is a 72 y.o. female who presents for follow-up of coronary artery disease. She has undergone stenting of the left circumflex in the past. Repeat cath demonstrated continued stent patency.   She continues to struggle with severe PAD. She has recently undergone finger amputation for ischemic changes of the distal finger. She has palpitations at times, otherwise no cardiac symptoms. Denies chest pain, chest pressure, edema, orthopnea, or PND. No lightheadedness or syncope.    Past Medical History:  Diagnosis Date  . Anemia    hx  . Anxiety   . Carotid artery occlusion   . CHF (congestive heart failure) (HCC)   . Constipation   . Coronary artery disease    a. s/p NSTEMI 02/2009 - PCI LCX with Xience DES. Otherwise branch vessel and Dist. RCA dzs. NL EF.  b. 2013: cath showing patency of LCx stent and mild nonobstructive dz in LAD; normal LV function  . Diabetic neuropathy with neurologic complication (HCC)   . Fall from slipping on wet surface Jan. 7, 2014   Right stump bleeding from fall  . GERD (gastroesophageal reflux disease)   . Goiter   . Hernia, umbilical   . History of blood transfusion   . History of right below knee amputation (HCC)   . Hyperlipidemia    Mixed  . Hypertension    Unspecified  . Kidney stones   . Loose, teeth    has loose bridge and two loose teeth holding it  . Myocardial infarction (HCC)   . Peripheral vascular disease (HCC)    unspecified, a. s/p L CEA b. s/p B fem-pop bypass c. s/p R BKA  . PONV (postoperative nausea and vomiting)   . Proliferative diabetic retinopathy of right eye (HCC)   . Raynaud disease   . Stones in the urinary tract   . Subclavian steal syndrome   .  Tobacco abuse    Remote  . Type II diabetes mellitus (HCC)   . UTI (lower urinary tract infection)   . UTI (urinary tract infection)   . Vitamin D deficiency     Past Surgical History:  Procedure Laterality Date  . ABDOMINAL ANGIOGRAM  09/21/2011   Procedure: ABDOMINAL ANGIOGRAM;  Surgeon: Nada Libman, MD;  Location[facility] CATH LAB;  Service: Cardiovascular;;  . ABDOMINAL AORTAGRAM N/A 05/17/2011   Procedure: ABDOMINAL Ronny Flurry;  Surgeon: Nada Libman, MD;  Location: [facility] CATH LAB;  Service: Cardiovascular;  Laterality: N/A;  . ABDOMINAL AORTAGRAM N/A 01/17/2012   Procedure: ABDOMINAL Ronny Flurry;  Surgeon: Nada Libman, MD;  Location: [facility] CATH LAB;  Service: Cardiovascular;  Laterality: N/A;  . ABDOMINAL HYSTERECTOMY     complete  . AMPUTATION  04/23/2012   Procedure: AMPUTATION BELOW KNEE;  Surgeon: Larina Earthly, MD;  Location: [facility] OR;  Service: Vascular;  Laterality: Right;  . AORTA - BILATERAL FEMORAL ARTERY BYPASS GRAFT  03/26/2012   Procedure: AORTA BIFEMORAL BYPASS GRAFT;  Surgeon: Larina Earthly, MD;  Location: [facility] OR;  Service: Vascular;  Laterality: N/A;  Aortic-bifemoral bypass using 14x69mm Hemashield graft .   Marland Kitchen BREAST LUMPECTOMY     right  . CAROTID ENDARTERECTOMY  ~ 2005   left  .  COLONOSCOPY  11/2010  . CORONARY ANGIOPLASTY WITH STENT PLACEMENT    . EYE SURGERY Right Feb. 9, 2015   Cataract  . EYE SURGERY Left Feb. 16, 2015   Cataract  . FEMORAL ARTERY - POPLITEAL ARTERY BYPASS GRAFT     left  . FEMORAL ARTERY EXPLORATION  03/26/2012   Procedure: FEMORAL ARTERY EXPLORATION;  Surgeon: Larina Earthly, MD;  Location: [facility] OR;  Service: Vascular;  Laterality: Right;  with Revision of Popliteal-Peroneal bypass graft using 6mm x 10cm thin wall goretex graft  . FEMORAL-POPLITEAL BYPASS GRAFT  08/22/2011   Procedure: BYPASS GRAFT FEMORAL-POPLITEAL ARTERY;  Surgeon: Gretta Began, MD;  Location: [facility] OR;  Service: Vascular;  Laterality: Right;  Thrombectomy and Revision using 7mm x 10cm  stretch goretex graft  . FEMORAL-TIBIAL BYPASS GRAFT Left 08/19/2015   Procedure: LEFT FEMORAL-  ABOVE KNEE POPLITEAL BYPASS GRAFT (VEIN) to BELOW KNEE POPLITEAL ARTERY BYPASS WITH SMALL SAPHENOUS VEIN GRAFT;  Surgeon: Larina Earthly, MD;  Location: [facility] OR;  Service: Vascular;  Laterality: Left;  . HERNIA REPAIR    . INTRAOPERATIVE ARTERIOGRAM  08/22/2011   Procedure: INTRA OPERATIVE ARTERIOGRAM;  Surgeon: Gretta Began, MD;  Location: [facility] OR;  Service: Vascular;  Laterality: Right;  to lower leg  . LEFT HEART CATHETERIZATION WITH CORONARY ANGIOGRAM N/A 04/05/2012   Procedure: LEFT HEART CATHETERIZATION WITH CORONARY ANGIOGRAM;  Surgeon: Tonny Bollman, MD;  Location: [facility] CATH LAB;  Service: Cardiovascular;  Laterality: N/A;  . LOWER EXTREMITY ANGIOGRAM  05/17/2011   Procedure: LOWER EXTREMITY ANGIOGRAM;  Surgeon: Nada Libman, MD;  Location: [facility] CATH LAB;  Service: Cardiovascular;;  . LOWER EXTREMITY ANGIOGRAM N/A 09/21/2011   Procedure: LOWER EXTREMITY ANGIOGRAM;  Surgeon: Nada Libman, MD;  Location: [facility] CATH LAB;  Service: Cardiovascular;  Laterality: N/A;  . MULTIPLE TOOTH EXTRACTIONS  08-29-2011   5 teeth extracted   . pci  01/17/12   RLE  . PERCUTANEOUS STENT INTERVENTION Right 05/17/2011   Procedure: PERCUTANEOUS STENT INTERVENTION;  Surgeon: Nada Libman, MD;  Location: [facility] CATH LAB;  Service: Cardiovascular;  Laterality: Right;  . PERIPHERAL VASCULAR CATHETERIZATION N/A 06/08/2015   Procedure: Abdominal Aortogram w/Lower Extremity;  Surgeon: Sherren Kerns, MD;  Location: [facility] INVASIVE CV LAB;  Service: Cardiovascular;  Laterality: N/A;  . TONSILLECTOMY AND ADENOIDECTOMY    . UMBILICAL HERNIA REPAIR  03/26/2012   Procedure: HERNIA REPAIR UMBILICAL ADULT;  Surgeon: Larina Earthly, MD;  Location: [facility] OR;  Service: Vascular;  Laterality: N/A;  Removal of Umbilical hernia sac  . UPPER GASTROINTESTINAL ENDOSCOPY  11/2010  . VESICOVAGINAL FISTULA CLOSURE W/ TAH      Current Outpatient Medications    Medication Sig Dispense Refill  . ALPRAZolam (XANAX) 0.25 MG tablet Take 0.25 mg by mouth daily.     Marland Kitchen aspirin EC 81 MG tablet Take 1 tablet (81 mg total) by mouth daily.    . calcium carbonate (TUMS - DOSED IN MG ELEMENTAL CALCIUM) 500 MG chewable tablet Chew 1,000 mg by mouth daily as needed for indigestion or heartburn.    . dexlansoprazole (DEXILANT) 60 MG capsule Take 60 mg by mouth 2 (two) times daily.    . ergocalciferol (VITAMIN D2) 50000 units capsule Take 50,000 Units by mouth 3 (three) times a week.    . furosemide (LASIX) 40 MG tablet Take 40 mg by mouth 2 (two) times daily.    . Insulin Degludec (TRESIBA FLEXTOUCH) 100 UNIT/ML SOPN Inject 14 Units into the skin daily.     Marland Kitchen  iron polysaccharides (NIFEREX) 150 MG capsule Take 600 mg by mouth 2 (two) times daily.     . isosorbide mononitrate (IMDUR) 30 MG 24 hr tablet Take 30 mg by mouth daily.    . Lactobacillus (ACIDOPHILUS PO) Take 1 capsule by mouth daily.    . meperidine (DEMEROL) 50 MG tablet Take 50 mg by mouth every 4 (four) hours as needed for severe pain.    . metFORMIN (GLUCOPHAGE) 500 MG tablet Take by mouth 2 (two) times daily with a meal.    . metoprolol succinate (TOPROL-XL) 50 MG 24 hr tablet Take 1 tablet (50 mg total) by mouth daily. 30 tablet 1  . nitroGLYCERIN (NITROSTAT) 0.4 MG SL tablet Place 1 tablet (0.4 mg total) under the tongue every 5 (five) minutes as needed. For chest pain 25 tablet 2  . omega-3 acid ethyl esters (LOVAZA) 1 G capsule Take 2 g by mouth 2 (two) times daily.     . prochlorperazine (COMPAZINE) 10 MG tablet Take 10 mg by mouth every 6 (six) hours as needed for nausea or vomiting.    . ramipril (ALTACE) 1.25 MG capsule Take 1.25 mg by mouth daily.    . rosuvastatin (CRESTOR) 10 MG tablet Take 10 mg by mouth every Monday.     . sodium chloride (OCEAN) 0.65 % SOLN nasal spray Place 2-4 sprays into both nostrils every 4 (four) hours as needed (for nose bleeds).    Marland Kitchen sulfamethoxazole-trimethoprim  (BACTRIM DS,SEPTRA DS) 800-160 MG tablet Take 1 tablet by mouth 2 (two) times daily.     No current facility-administered medications for this visit.     Allergies:   Acetaminophen; Ceftriaxone; Codeine; Erythromycin; Hydrocodone-acetaminophen; Hydromorphone; Propoxyphene hcl; Statins; Eggs or egg-derived products; Ibuprofen; and Shellfish-derived products   Social History:  The patient  reports that she quit smoking about 16 years ago. Her smoking use included cigarettes. She has a 78.00 pack-year smoking history. she has never used smokeless tobacco. She reports that she drinks about 9.0 oz of alcohol per week. She reports that she does not use drugs.   Family History:  The patient's  family history includes Diabetes in her brother; Heart attack in her father and mother; Hypertension in her brother and brother.    ROS:  Please see the history of present illness.   All other systems are reviewed and negative.    PHYSICAL EXAM: VS:  BP 122/84   Pulse 87   Ht 5\' 4"  (1.626 m)   Wt 164 lb (74.4 kg)   BMI 28.15 kg/m  , BMI Body mass index is 28.15 kg/m. GEN: Well nourished, well developed, in no acute distress  HEENT: normal  Neck: no JVD, no masses. BL carotid bruits Cardiac: RRR without murmur or gallop                Respiratory:  clear to auscultation bilaterally, normal work of breathing GI: soft, nontender, nondistended, + BS MS: no deformity or atrophy  Ext: trace pretibial edema Skin: warm and dry, no rash Neuro:  Strength and sensation are intact Psych: euthymic mood, full affect  EKG:  EKG is not ordered today. The ekg ordered today shows NSR, ST-T changes consider lateral ischemia  Recent Labs: No results found for requested labs within last 8760 hours.   Lipid Panel     Component Value Date/Time   CHOL 163 07/01/2014 0841   TRIG 148.0 07/01/2014 0841   HDL 57.70 07/01/2014 0841   CHOLHDL 3 07/01/2014 0841  VLDL 29.6 07/01/2014 0841   LDLCALC 76 07/01/2014  0841      Wt Readings from Last 3 Encounters:  08/24/17 164 lb (74.4 kg)  03/22/17 164 lb 3.2 oz (74.5 kg)  10/25/16 159 lb (72.1 kg)     Cardiac Studies Reviewed: Echo 12-10-15: Study Conclusions  - Left ventricle: The cavity size was normal. Systolic function was   normal. The estimated ejection fraction was in the range of 55%   to 60%. There is possible akinesis of the apical myocardium and ?   false tendon in the LV apex vs. thrombus. . The transmitral flow   pattern was not recorded. - Aortic valve: Mildly to moderately calcified annulus. Trileaflet;   mildly thickened, mildly calcified leaflets. - Mitral valve: Moderately calcified annulus. Moderate diffuse   thickening and calcification of the anterior leaflet. The   findings are consistent with moderate stenosis. There was mild   regurgitation. Valve area by continuity equation (using LVOT   flow): 1.52 cm^2. - Tricuspid valve: There was trivial regurgitation.  ASSESSMENT AND PLAN: 1.  CAD, native vessel, without angina: medical program reviewed and will be continued.   2. HTN: BP controlled on current Rx  3. Hyperlipidemia: treated with a statin drug, followed by Dr Evlyn Kanner  4. PAD, severe: followed by Dr Early  5. Palpitations: benign by history, normal LV function, pt reassured.   Current medicines are reviewed with the patient today.  The patient does not have concerns regarding medicines.  Labs/ tests ordered today include:   Orders Placed This Encounter  Procedures  . EKG 12-Lead    Disposition:   FU one year  Signed, Tonny Bollman, MD  08/26/2017 9:18 PM    [facility] [address] [place], [place], [place]  [address] Phone: [phone]; Fax: [phone]

## 2017-10-04 ENCOUNTER — Other Ambulatory Visit: Payer: Self-pay

## 2017-10-04 DIAGNOSIS — Z9862 Peripheral vascular angioplasty status: Secondary | ICD-10-CM

## 2017-10-04 DIAGNOSIS — I70219 Atherosclerosis of native arteries of extremities with intermittent claudication, unspecified extremity: Secondary | ICD-10-CM

## 2017-10-04 DIAGNOSIS — I779 Disorder of arteries and arterioles, unspecified: Secondary | ICD-10-CM

## 2017-10-04 DIAGNOSIS — Z89511 Acquired absence of right leg below knee: Secondary | ICD-10-CM

## 2017-10-31 ENCOUNTER — Ambulatory Visit (HOSPITAL_COMMUNITY)
Admission: RE | Admit: 2017-10-31 | Discharge: 2017-10-31 | Disposition: A | Discharge status: Home / Self Care | Payer: Medicare Other | Source: Ambulatory Visit | Attending: Vascular Surgery | Admitting: Vascular Surgery

## 2017-10-31 ENCOUNTER — Ambulatory Visit (INDEPENDENT_AMBULATORY_CARE_PROVIDER_SITE_OTHER): Payer: Medicare Other | Admitting: Vascular Surgery

## 2017-10-31 ENCOUNTER — Encounter: Payer: Self-pay | Admitting: Vascular Surgery

## 2017-10-31 ENCOUNTER — Ambulatory Visit (INDEPENDENT_AMBULATORY_CARE_PROVIDER_SITE_OTHER)
Admission: RE | Admit: 2017-10-31 | Discharge: 2017-10-31 | Disposition: A | Discharge status: Home / Self Care | Payer: Medicare Other | Source: Ambulatory Visit | Attending: Vascular Surgery | Admitting: Vascular Surgery

## 2017-10-31 VITALS — BP 123/90 | HR 78 | Temp 97.8°F | Resp 16 | Ht 64.0 in | Wt 160.0 lb

## 2017-10-31 DIAGNOSIS — I779 Disorder of arteries and arterioles, unspecified: Secondary | ICD-10-CM

## 2017-10-31 DIAGNOSIS — Z89511 Acquired absence of right leg below knee: Secondary | ICD-10-CM

## 2017-10-31 DIAGNOSIS — I70219 Atherosclerosis of native arteries of extremities with intermittent claudication, unspecified extremity: Secondary | ICD-10-CM

## 2017-10-31 DIAGNOSIS — I70302 Unspecified atherosclerosis of unspecified type of bypass graft(s) of the extremities, left leg: Secondary | ICD-10-CM | POA: Insufficient documentation

## 2017-10-31 DIAGNOSIS — Z9862 Peripheral vascular angioplasty status: Secondary | ICD-10-CM | POA: Insufficient documentation

## 2017-10-31 DIAGNOSIS — I10 Essential (primary) hypertension: Secondary | ICD-10-CM | POA: Insufficient documentation

## 2017-10-31 NOTE — H&P (View-Only) (Signed)
[facility] [place]  Patient name: [name] MRN: [id number] DOB: [birth year] Sex: female  REASON FOR VISIT: Follow-up peripheral vascular occlusive disease  HPI: [name] is a 72 y.o. female today for follow-up of diffuse peripheral vascular occlusive disease dating back over 20 years.  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  She has rest pain in her left foot with no new open ulceration.  Past Medical History:  Diagnosis Date  . Anemia    hx  . Anxiety   . Carotid artery occlusion   . CHF (congestive heart failure) (HCC)   . Constipation   . Coronary artery disease    a. s/p NSTEMI 02/2009 - PCI LCX with Xience DES. Otherwise branch vessel and Dist. RCA dzs. NL EF.  b. 2013: cath showing patency of LCx stent and mild nonobstructive dz in LAD; normal LV function  . Diabetic neuropathy with neurologic complication (HCC)   . Fall from slipping on wet surface Jan. 7, 2014   Right stump bleeding from fall  . GERD (gastroesophageal reflux disease)   . Goiter   . Hernia, umbilical   . History of blood transfusion   . History of right below knee amputation (HCC)   . Hyperlipidemia    Mixed  . Hypertension    Unspecified  . Kidney stones   . Loose, teeth    has loose bridge and two loose teeth holding it  . Myocardial infarction (HCC)   . Peripheral vascular disease (HCC)    unspecified, a. s/p L CEA b. s/p B fem-pop bypass c. s/p R BKA  . PONV (postoperative nausea and vomiting)   . Proliferative diabetic retinopathy of right eye (HCC)   . Raynaud disease   . Stones in the urinary tract   . Subclavian steal syndrome   . Tobacco abuse    Remote  . Type II diabetes mellitus (HCC)   . UTI (lower urinary tract infection)   . UTI (urinary tract infection)   . Vitamin D deficiency      Family History  Problem Relation Age of Onset  . Heart attack Mother   . Heart attack Father   . Hypertension Brother   . Diabetes Brother   . Hypertension Brother     SOCIAL HISTORY: Social History   Tobacco Use  . Smoking status: Former Smoker    Packs/day: 2.00    Years: 39.00    Pack years: 78.00    Types: Cigarettes    Last attempt to quit: 07/05/2001    Years since quitting: 16.3  . Smokeless tobacco: Never Used  . Tobacco comment: Quit 15 years ago  Substance Use Topics  . Alcohol use: Yes    Alcohol/week: 9.0 oz    Types: 1 Shots of liquor, 14 Standard drinks or equivalent per week    Comment: burbon     Allergies  Allergen Reactions  . Acetaminophen Nausea Only and Other (See Comments)    Does not tolerate well, nausea  . Ceftriaxone Itching    Pt has tolerated amoxicillin and cephalexin in the past.  . Codeine Nausea And Vomiting  . Erythromycin Nausea And Vomiting  . Hydrocodone-Acetaminophen Nausea And Vomiting  . Hydromorphone Nausea And Vomiting  . Propoxyphene Hcl Nausea And Vomiting  . Statins Other (See Comments)    Leg myalgias  . Eggs  Or Egg-Derived Products Other (See Comments)    "pt does not eat" Unspecified reaction  . Ibuprofen Other (See Comments)    Unspecified  Per Pt "Does not tolerate well"  . Shellfish-Derived Products Other (See Comments)    "pt does not eat" Unspecified reaction    Current Outpatient Medications  Medication Sig Dispense Refill  . aspirin EC 81 MG tablet Take 1 tablet (81 mg total) by mouth daily. (Patient taking differently: Take 81 mg by mouth daily. Takes two daily)    . calcium carbonate (TUMS - DOSED IN MG ELEMENTAL CALCIUM) 500 MG chewable tablet Chew 1,000 mg by mouth daily as needed for indigestion or heartburn.    . dexlansoprazole (DEXILANT) 60 MG capsule Take 60 mg by mouth 2 (two) times daily.    . ergocalciferol (VITAMIN D2) 50000 units capsule Take 50,000 Units by mouth 3 (three) times a week.     . furosemide (LASIX) 40 MG tablet Take 40 mg by mouth 2 (two) times daily.    . Insulin Degludec (TRESIBA FLEXTOUCH) 100 UNIT/ML SOPN Inject 14 Units into the skin daily.     . iron polysaccharides (NIFEREX) 150 MG capsule Take 600 mg by mouth 2 (two) times daily.     . isosorbide mononitrate (IMDUR) 30 MG 24 hr tablet Take 30 mg by mouth daily.    . Lactobacillus (ACIDOPHILUS PO) Take 1 capsule by mouth daily.    . meperidine (DEMEROL) 50 MG tablet Take 50 mg by mouth every 4 (four) hours as needed for severe pain.    . metFORMIN (GLUCOPHAGE) 500 MG tablet Take by mouth 2 (two) times daily with a meal.    . metoprolol succinate (TOPROL-XL) 50 MG 24 hr tablet Take 1 tablet (50 mg total) by mouth daily. 30 tablet 1  . nitroGLYCERIN (NITROSTAT) 0.4 MG SL tablet Place 1 tablet (0.4 mg total) under the tongue every 5 (five) minutes as needed. For chest pain 25 tablet 2  . omega-3 acid ethyl esters (LOVAZA) 1 G capsule Take 2 g by mouth 2 (two) times daily.     . prochlorperazine (COMPAZINE) 10 MG tablet Take 10 mg by mouth every 6 (six) hours as needed for nausea or vomiting.    . ramipril (ALTACE) 1.25 MG capsule Take 1.25 mg by mouth daily.    . rosuvastatin (CRESTOR) 10 MG tablet Take 10 mg by mouth every Monday.     . sodium chloride (OCEAN) 0.65 % SOLN nasal spray Place 2-4 sprays into both nostrils every 4 (four) hours as needed (for nose bleeds).    Marland Kitchen sulfamethoxazole-trimethoprim (BACTRIM DS,SEPTRA DS) 800-160 MG tablet Take 1 tablet by mouth 2 (two) times daily.    Marland Kitchen ALPRAZolam (XANAX) 0.25 MG tablet Take 0.25 mg by mouth daily.      No current facility-administered medications for this visit.     REVIEW OF SYSTEMS:  [X]  denotes positive finding, [ ]  denotes negative finding Cardiac  Comments:  Chest pain or chest pressure:    Shortness of breath upon exertion:    Short of breath when lying flat:    Irregular heart rhythm:        Vascular    Pain in calf, thigh, or hip brought on  by ambulation:    Pain in feet at night that wakes you up from your sleep:  x   Blood clot in your veins:    Leg swelling:  x         PHYSICAL EXAM:  Vitals:   10/31/17 1221  BP: 123/90  Pulse: 78  Resp: 16  Temp: 97.8 F (36.6 C)  SpO2: 99%  Weight: 160 lb (72.6 kg)  Height: 5\' 4"  (1.626 m)    GENERAL: The patient is a well-nourished female, in no acute distress. The vital signs are documented above. CARDIOVASCULAR: No radial pulses bilaterally.  She has easily palpable femoral pulses from her old aortobifemoral bypass.  No left popliteal or distal pulses PULMONARY: There is good air exchange  MUSCULOSKELETAL: There are no major deformities or cyanosis. NEUROLOGIC: No focal weakness or paresthesias are detected. SKIN: Dependent rubor in her left foot with prior partial toe amputations PSYCHIATRIC: The patient has a normal affect.  DATA:  Monophasic flow throughout her above-knee below-knee popliteal bypass on the left which was done with reversed small saphenous vein.  It is possible that this is even occluded with collateral flow being visualized.  Ankle arm index is 0.4 to  MEDICAL ISSUES: Progression to critical limb ischemia and rest pain in her left foot.  Recommended arteriography for further evaluation.  Explained that this may be amenable to angioplasty but may also require prosthetic bypass.  This certainly is limb threatening but no acute change.  We will schedule outpatient arteriography at her convenience with Dr. Eyvonne Left, MD [facility] [phone] Pager [phone]

## 2017-10-31 NOTE — Progress Notes (Signed)
[facility] [place]  Patient name: [name] MRN: [id number] DOB: [birth year] Sex: female  REASON FOR VISIT: Follow-up peripheral vascular occlusive disease  HPI: [name] is a 72 y.o. female today for follow-up of diffuse peripheral vascular occlusive disease dating back over 20 years.  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  She has rest pain in her left foot with no new open ulceration.  Past Medical History:  Diagnosis Date  . Anemia    hx  . Anxiety   . Carotid artery occlusion   . CHF (congestive heart failure) (HCC)   . Constipation   . Coronary artery disease    a. s/p NSTEMI 02/2009 - PCI LCX with Xience DES. Otherwise branch vessel and Dist. RCA dzs. NL EF.  b. 2013: cath showing patency of LCx stent and mild nonobstructive dz in LAD; normal LV function  . Diabetic neuropathy with neurologic complication (HCC)   . Fall from slipping on wet surface Jan. 7, 2014   Right stump bleeding from fall  . GERD (gastroesophageal reflux disease)   . Goiter   . Hernia, umbilical   . History of blood transfusion   . History of right below knee amputation (HCC)   . Hyperlipidemia    Mixed  . Hypertension    Unspecified  . Kidney stones   . Loose, teeth    has loose bridge and two loose teeth holding it  . Myocardial infarction (HCC)   . Peripheral vascular disease (HCC)    unspecified, a. s/p L CEA b. s/p B fem-pop bypass c. s/p R BKA  . PONV (postoperative nausea and vomiting)   . Proliferative diabetic retinopathy of right eye (HCC)   . Raynaud disease   . Stones in the urinary tract   . Subclavian steal syndrome   . Tobacco abuse    Remote  . Type II diabetes mellitus (HCC)   . UTI (lower urinary tract infection)   . UTI (urinary tract infection)   . Vitamin D deficiency      Family History  Problem Relation Age of Onset  . Heart attack Mother   . Heart attack Father   . Hypertension Brother   . Diabetes Brother   . Hypertension Brother     SOCIAL HISTORY: Social History   Tobacco Use  . Smoking status: Former Smoker    Packs/day: 2.00    Years: 39.00    Pack years: 78.00    Types: Cigarettes    Last attempt to quit: 07/05/2001    Years since quitting: 16.3  . Smokeless tobacco: Never Used  . Tobacco comment: Quit 15 years ago  Substance Use Topics  . Alcohol use: Yes    Alcohol/week: 9.0 oz    Types: 1 Shots of liquor, 14 Standard drinks or equivalent per week    Comment: burbon     Allergies  Allergen Reactions  . Acetaminophen Nausea Only and Other (See Comments)    Does not tolerate well, nausea  . Ceftriaxone Itching    Pt has tolerated amoxicillin and cephalexin in the past.  . Codeine Nausea And Vomiting  . Erythromycin Nausea And Vomiting  . Hydrocodone-Acetaminophen Nausea And Vomiting  . Hydromorphone Nausea And Vomiting  . Propoxyphene Hcl Nausea And Vomiting  . Statins Other (See Comments)    Leg myalgias  . Eggs  Or Egg-Derived Products Other (See Comments)    "pt does not eat" Unspecified reaction  . Ibuprofen Other (See Comments)    Unspecified  Per Pt "Does not tolerate well"  . Shellfish-Derived Products Other (See Comments)    "pt does not eat" Unspecified reaction    Current Outpatient Medications  Medication Sig Dispense Refill  . aspirin EC 81 MG tablet Take 1 tablet (81 mg total) by mouth daily. (Patient taking differently: Take 81 mg by mouth daily. Takes two daily)    . calcium carbonate (TUMS - DOSED IN MG ELEMENTAL CALCIUM) 500 MG chewable tablet Chew 1,000 mg by mouth daily as needed for indigestion or heartburn.    . dexlansoprazole (DEXILANT) 60 MG capsule Take 60 mg by mouth 2 (two) times daily.    . ergocalciferol (VITAMIN D2) 50000 units capsule Take 50,000 Units by mouth 3 (three) times a week.     . furosemide (LASIX) 40 MG tablet Take 40 mg by mouth 2 (two) times daily.    . Insulin Degludec (TRESIBA FLEXTOUCH) 100 UNIT/ML SOPN Inject 14 Units into the skin daily.     . iron polysaccharides (NIFEREX) 150 MG capsule Take 600 mg by mouth 2 (two) times daily.     . isosorbide mononitrate (IMDUR) 30 MG 24 hr tablet Take 30 mg by mouth daily.    . Lactobacillus (ACIDOPHILUS PO) Take 1 capsule by mouth daily.    . meperidine (DEMEROL) 50 MG tablet Take 50 mg by mouth every 4 (four) hours as needed for severe pain.    . metFORMIN (GLUCOPHAGE) 500 MG tablet Take by mouth 2 (two) times daily with a meal.    . metoprolol succinate (TOPROL-XL) 50 MG 24 hr tablet Take 1 tablet (50 mg total) by mouth daily. 30 tablet 1  . nitroGLYCERIN (NITROSTAT) 0.4 MG SL tablet Place 1 tablet (0.4 mg total) under the tongue every 5 (five) minutes as needed. For chest pain 25 tablet 2  . omega-3 acid ethyl esters (LOVAZA) 1 G capsule Take 2 g by mouth 2 (two) times daily.     . prochlorperazine (COMPAZINE) 10 MG tablet Take 10 mg by mouth every 6 (six) hours as needed for nausea or vomiting.    . ramipril (ALTACE) 1.25 MG capsule Take 1.25 mg by mouth daily.    . rosuvastatin (CRESTOR) 10 MG tablet Take 10 mg by mouth every Monday.     . sodium chloride (OCEAN) 0.65 % SOLN nasal spray Place 2-4 sprays into both nostrils every 4 (four) hours as needed (for nose bleeds).    Marland Kitchen sulfamethoxazole-trimethoprim (BACTRIM DS,SEPTRA DS) 800-160 MG tablet Take 1 tablet by mouth 2 (two) times daily.    Marland Kitchen ALPRAZolam (XANAX) 0.25 MG tablet Take 0.25 mg by mouth daily.      No current facility-administered medications for this visit.     REVIEW OF SYSTEMS:  [X]  denotes positive finding, [ ]  denotes negative finding Cardiac  Comments:  Chest pain or chest pressure:    Shortness of breath upon exertion:    Short of breath when lying flat:    Irregular heart rhythm:        Vascular    Pain in calf, thigh, or hip brought on  by ambulation:    Pain in feet at night that wakes you up from your sleep:  x   Blood clot in your veins:    Leg swelling:  x         PHYSICAL EXAM:  Vitals:   10/31/17 1221  BP: 123/90  Pulse: 78  Resp: 16  Temp: 97.8 F (36.6 C)  SpO2: 99%  Weight: 160 lb (72.6 kg)  Height: 5\' 4"  (1.626 m)    GENERAL: The patient is a well-nourished female, in no acute distress. The vital signs are documented above. CARDIOVASCULAR: No radial pulses bilaterally.  She has easily palpable femoral pulses from her old aortobifemoral bypass.  No left popliteal or distal pulses PULMONARY: There is good air exchange  MUSCULOSKELETAL: There are no major deformities or cyanosis. NEUROLOGIC: No focal weakness or paresthesias are detected. SKIN: Dependent rubor in her left foot with prior partial toe amputations PSYCHIATRIC: The patient has a normal affect.  DATA:  Monophasic flow throughout her above-knee below-knee popliteal bypass on the left which was done with reversed small saphenous vein.  It is possible that this is even occluded with collateral flow being visualized.  Ankle arm index is 0.4 to  MEDICAL ISSUES: Progression to critical limb ischemia and rest pain in her left foot.  Recommended arteriography for further evaluation.  Explained that this may be amenable to angioplasty but may also require prosthetic bypass.  This certainly is limb threatening but no acute change.  We will schedule outpatient arteriography at her convenience with Dr. Eyvonne Left, MD [facility] [phone] Pager [phone]

## 2017-11-01 ENCOUNTER — Other Ambulatory Visit: Payer: Self-pay | Admitting: *Deleted

## 2017-11-01 NOTE — Progress Notes (Signed)
PATIENT DENIES ALLERGY TO SHELLFISH OR IV CONTRAST AND HAS TOLERATED IV CONTRAST IN THE PAST ACCORDING TO DR. EARLY.Call to patient and spouse to be at [facility] admitting department at 5:30 am on 11/28/17 for procedure.Hold pm dose Metformin on 5/27, No food or drink past MN night. Take Lasix, isosorbide, metoprolol and ramipril with sips of water am of procedure. Hold all other medications until after this procedure. Spouse very concerned about patient getting IV started /difficult to impossible IV stick wanted an IV expert. I forwarded this info to Clydie Braun when booked and added to Orders for IV team to start IV .

## 2017-11-21 ENCOUNTER — Inpatient Hospital Stay (HOSPITAL_COMMUNITY): Admission: RE | Disposition: E | Payer: Self-pay | Source: Home / Self Care | Attending: Surgery

## 2017-11-21 ENCOUNTER — Encounter (HOSPITAL_COMMUNITY): Payer: Self-pay | Admitting: General Practice

## 2017-11-21 ENCOUNTER — Inpatient Hospital Stay (HOSPITAL_COMMUNITY)
Admission: RE | Admit: 2017-11-21 | Discharge: 2018-01-01 | DRG: 252 | Disposition: E | Payer: Medicare Other | Attending: Surgery | Admitting: Surgery

## 2017-11-21 DIAGNOSIS — E113591 Type 2 diabetes mellitus with proliferative diabetic retinopathy without macular edema, right eye: Secondary | ICD-10-CM | POA: Diagnosis present

## 2017-11-21 DIAGNOSIS — E785 Hyperlipidemia, unspecified: Secondary | ICD-10-CM | POA: Diagnosis present

## 2017-11-21 DIAGNOSIS — Z515 Encounter for palliative care: Secondary | ICD-10-CM | POA: Diagnosis present

## 2017-11-21 DIAGNOSIS — I509 Heart failure, unspecified: Secondary | ICD-10-CM | POA: Diagnosis present

## 2017-11-21 DIAGNOSIS — L8990 Pressure ulcer of unspecified site, unspecified stage: Secondary | ICD-10-CM

## 2017-11-21 DIAGNOSIS — E559 Vitamin D deficiency, unspecified: Secondary | ICD-10-CM | POA: Diagnosis present

## 2017-11-21 DIAGNOSIS — Z881 Allergy status to other antibiotic agents status: Secondary | ICD-10-CM

## 2017-11-21 DIAGNOSIS — I771 Stricture of artery: Secondary | ICD-10-CM | POA: Diagnosis present

## 2017-11-21 DIAGNOSIS — F419 Anxiety disorder, unspecified: Secondary | ICD-10-CM | POA: Diagnosis present

## 2017-11-21 DIAGNOSIS — K567 Ileus, unspecified: Secondary | ICD-10-CM | POA: Diagnosis not present

## 2017-11-21 DIAGNOSIS — J95821 Acute postprocedural respiratory failure: Secondary | ICD-10-CM | POA: Diagnosis not present

## 2017-11-21 DIAGNOSIS — E875 Hyperkalemia: Secondary | ICD-10-CM | POA: Diagnosis present

## 2017-11-21 DIAGNOSIS — Z66 Do not resuscitate: Secondary | ICD-10-CM | POA: Diagnosis present

## 2017-11-21 DIAGNOSIS — L97929 Non-pressure chronic ulcer of unspecified part of left lower leg with unspecified severity: Secondary | ICD-10-CM | POA: Diagnosis present

## 2017-11-21 DIAGNOSIS — I4891 Unspecified atrial fibrillation: Secondary | ICD-10-CM | POA: Diagnosis not present

## 2017-11-21 DIAGNOSIS — I70202 Unspecified atherosclerosis of native arteries of extremities, left leg: Secondary | ICD-10-CM | POA: Diagnosis present

## 2017-11-21 DIAGNOSIS — E878 Other disorders of electrolyte and fluid balance, not elsewhere classified: Secondary | ICD-10-CM | POA: Diagnosis not present

## 2017-11-21 DIAGNOSIS — Z888 Allergy status to other drugs, medicaments and biological substances status: Secondary | ICD-10-CM

## 2017-11-21 DIAGNOSIS — I739 Peripheral vascular disease, unspecified: Secondary | ICD-10-CM | POA: Diagnosis present

## 2017-11-21 DIAGNOSIS — E1165 Type 2 diabetes mellitus with hyperglycemia: Secondary | ICD-10-CM | POA: Diagnosis present

## 2017-11-21 DIAGNOSIS — I219 Acute myocardial infarction, unspecified: Secondary | ICD-10-CM | POA: Diagnosis present

## 2017-11-21 DIAGNOSIS — E870 Hyperosmolality and hypernatremia: Secondary | ICD-10-CM | POA: Diagnosis present

## 2017-11-21 DIAGNOSIS — K219 Gastro-esophageal reflux disease without esophagitis: Secondary | ICD-10-CM | POA: Diagnosis present

## 2017-11-21 DIAGNOSIS — I2510 Atherosclerotic heart disease of native coronary artery without angina pectoris: Secondary | ICD-10-CM | POA: Diagnosis present

## 2017-11-21 DIAGNOSIS — Z885 Allergy status to narcotic agent status: Secondary | ICD-10-CM

## 2017-11-21 DIAGNOSIS — R112 Nausea with vomiting, unspecified: Secondary | ICD-10-CM

## 2017-11-21 DIAGNOSIS — E43 Unspecified severe protein-calorie malnutrition: Secondary | ICD-10-CM | POA: Diagnosis not present

## 2017-11-21 DIAGNOSIS — K2210 Ulcer of esophagus without bleeding: Secondary | ICD-10-CM | POA: Diagnosis not present

## 2017-11-21 DIAGNOSIS — Z7984 Long term (current) use of oral hypoglycemic drugs: Secondary | ICD-10-CM

## 2017-11-21 DIAGNOSIS — T82858A Stenosis of vascular prosthetic devices, implants and grafts, initial encounter: Secondary | ICD-10-CM | POA: Diagnosis present

## 2017-11-21 DIAGNOSIS — Z955 Presence of coronary angioplasty implant and graft: Secondary | ICD-10-CM

## 2017-11-21 DIAGNOSIS — Z794 Long term (current) use of insulin: Secondary | ICD-10-CM

## 2017-11-21 DIAGNOSIS — S300XXA Contusion of lower back and pelvis, initial encounter: Secondary | ICD-10-CM | POA: Diagnosis not present

## 2017-11-21 DIAGNOSIS — Z978 Presence of other specified devices: Secondary | ICD-10-CM

## 2017-11-21 DIAGNOSIS — E11649 Type 2 diabetes mellitus with hypoglycemia without coma: Secondary | ICD-10-CM | POA: Diagnosis not present

## 2017-11-21 DIAGNOSIS — I70248 Atherosclerosis of native arteries of left leg with ulceration of other part of lower left leg: Secondary | ICD-10-CM | POA: Diagnosis not present

## 2017-11-21 DIAGNOSIS — Z789 Other specified health status: Secondary | ICD-10-CM

## 2017-11-21 DIAGNOSIS — Z87442 Personal history of urinary calculi: Secondary | ICD-10-CM

## 2017-11-21 DIAGNOSIS — E876 Hypokalemia: Secondary | ICD-10-CM | POA: Diagnosis not present

## 2017-11-21 DIAGNOSIS — I97711 Intraoperative cardiac arrest during other surgery: Secondary | ICD-10-CM | POA: Diagnosis not present

## 2017-11-21 DIAGNOSIS — Z90710 Acquired absence of both cervix and uterus: Secondary | ICD-10-CM

## 2017-11-21 DIAGNOSIS — J690 Pneumonitis due to inhalation of food and vomit: Secondary | ICD-10-CM | POA: Diagnosis not present

## 2017-11-21 DIAGNOSIS — E1151 Type 2 diabetes mellitus with diabetic peripheral angiopathy without gangrene: Principal | ICD-10-CM | POA: Diagnosis present

## 2017-11-21 DIAGNOSIS — E1140 Type 2 diabetes mellitus with diabetic neuropathy, unspecified: Secondary | ICD-10-CM | POA: Diagnosis present

## 2017-11-21 DIAGNOSIS — I469 Cardiac arrest, cause unspecified: Secondary | ICD-10-CM | POA: Diagnosis not present

## 2017-11-21 DIAGNOSIS — N179 Acute kidney failure, unspecified: Secondary | ICD-10-CM | POA: Diagnosis not present

## 2017-11-21 DIAGNOSIS — R578 Other shock: Secondary | ICD-10-CM | POA: Diagnosis not present

## 2017-11-21 DIAGNOSIS — L089 Local infection of the skin and subcutaneous tissue, unspecified: Secondary | ICD-10-CM | POA: Diagnosis not present

## 2017-11-21 DIAGNOSIS — I252 Old myocardial infarction: Secondary | ICD-10-CM

## 2017-11-21 DIAGNOSIS — Z87891 Personal history of nicotine dependence: Secondary | ICD-10-CM

## 2017-11-21 DIAGNOSIS — Z7982 Long term (current) use of aspirin: Secondary | ICD-10-CM

## 2017-11-21 DIAGNOSIS — R140 Abdominal distension (gaseous): Secondary | ICD-10-CM

## 2017-11-21 DIAGNOSIS — Z91013 Allergy to seafood: Secondary | ICD-10-CM

## 2017-11-21 DIAGNOSIS — I7300 Raynaud's syndrome without gangrene: Secondary | ICD-10-CM | POA: Diagnosis present

## 2017-11-21 DIAGNOSIS — T17908A Unspecified foreign body in respiratory tract, part unspecified causing other injury, initial encounter: Secondary | ICD-10-CM

## 2017-11-21 DIAGNOSIS — I7777 Dissection of artery of lower extremity: Secondary | ICD-10-CM | POA: Diagnosis not present

## 2017-11-21 DIAGNOSIS — R6521 Severe sepsis with septic shock: Secondary | ICD-10-CM | POA: Diagnosis not present

## 2017-11-21 DIAGNOSIS — D62 Acute posthemorrhagic anemia: Secondary | ICD-10-CM | POA: Diagnosis not present

## 2017-11-21 DIAGNOSIS — N390 Urinary tract infection, site not specified: Secondary | ICD-10-CM | POA: Diagnosis not present

## 2017-11-21 DIAGNOSIS — I998 Other disorder of circulatory system: Secondary | ICD-10-CM | POA: Diagnosis not present

## 2017-11-21 DIAGNOSIS — Z9119 Patient's noncompliance with other medical treatment and regimen: Secondary | ICD-10-CM

## 2017-11-21 DIAGNOSIS — Z6829 Body mass index (BMI) 29.0-29.9, adult: Secondary | ICD-10-CM

## 2017-11-21 DIAGNOSIS — E872 Acidosis: Secondary | ICD-10-CM | POA: Diagnosis not present

## 2017-11-21 DIAGNOSIS — E1152 Type 2 diabetes mellitus with diabetic peripheral angiopathy with gangrene: Secondary | ICD-10-CM | POA: Diagnosis present

## 2017-11-21 DIAGNOSIS — E861 Hypovolemia: Secondary | ICD-10-CM | POA: Diagnosis not present

## 2017-11-21 DIAGNOSIS — Z91012 Allergy to eggs: Secondary | ICD-10-CM

## 2017-11-21 DIAGNOSIS — R000 Tachycardia, unspecified: Secondary | ICD-10-CM | POA: Diagnosis not present

## 2017-11-21 DIAGNOSIS — Z89511 Acquired absence of right leg below knee: Secondary | ICD-10-CM

## 2017-11-21 DIAGNOSIS — I110 Hypertensive heart disease with heart failure: Secondary | ICD-10-CM | POA: Diagnosis present

## 2017-11-21 DIAGNOSIS — G9340 Encephalopathy, unspecified: Secondary | ICD-10-CM | POA: Diagnosis not present

## 2017-11-21 DIAGNOSIS — R0682 Tachypnea, not elsewhere classified: Secondary | ICD-10-CM

## 2017-11-21 DIAGNOSIS — Z79899 Other long term (current) drug therapy: Secondary | ICD-10-CM

## 2017-11-21 DIAGNOSIS — Z8249 Family history of ischemic heart disease and other diseases of the circulatory system: Secondary | ICD-10-CM

## 2017-11-21 DIAGNOSIS — I97638 Postprocedural hematoma of a circulatory system organ or structure following other circulatory system procedure: Secondary | ICD-10-CM | POA: Diagnosis not present

## 2017-11-21 DIAGNOSIS — Z89021 Acquired absence of right finger(s): Secondary | ICD-10-CM

## 2017-11-21 DIAGNOSIS — A419 Sepsis, unspecified organism: Secondary | ICD-10-CM | POA: Diagnosis not present

## 2017-11-21 DIAGNOSIS — Z4659 Encounter for fitting and adjustment of other gastrointestinal appliance and device: Secondary | ICD-10-CM

## 2017-11-21 DIAGNOSIS — Z886 Allergy status to analgesic agent status: Secondary | ICD-10-CM

## 2017-11-21 HISTORY — PX: PERIPHERAL VASCULAR BALLOON ANGIOPLASTY: CATH118281

## 2017-11-21 HISTORY — PX: PERIPHERAL VASCULAR INTERVENTION: CATH118257

## 2017-11-21 HISTORY — PX: LOWER EXTREMITY ANGIOGRAPHY: CATH118251

## 2017-11-21 LAB — POCT ACTIVATED CLOTTING TIME
ACTIVATED CLOTTING TIME: 235 s
ACTIVATED CLOTTING TIME: 186 s
Activated Clotting Time: 213 seconds
Activated Clotting Time: 219 seconds
Activated Clotting Time: 235 seconds
Activated Clotting Time: 208 seconds

## 2017-11-21 LAB — POCT I-STAT, CHEM 8
BUN: 12 mg/dL (ref 6–20)
CALCIUM ION: 1.1 mmol/L — AB (ref 1.15–1.40)
Chloride: 99 mmol/L — ABNORMAL LOW (ref 101–111)
Creatinine, Ser: 0.9 mg/dL (ref 0.44–1.00)
GLUCOSE: 67 mg/dL (ref 65–99)
HEMATOCRIT: 33 % — AB (ref 36.0–46.0)
HEMOGLOBIN: 11.2 g/dL — AB (ref 12.0–15.0)
Potassium: 3.6 mmol/L (ref 3.5–5.1)
SODIUM: 139 mmol/L (ref 135–145)
TCO2: 28 mmol/L (ref 22–32)

## 2017-11-21 LAB — GLUCOSE, CAPILLARY
GLUCOSE-CAPILLARY: 62 mg/dL — AB (ref 65–99)
GLUCOSE-CAPILLARY: 124 mg/dL — AB (ref 65–99)
GLUCOSE-CAPILLARY: 66 mg/dL (ref 65–99)
GLUCOSE-CAPILLARY: 66 mg/dL (ref 65–99)
Glucose-Capillary: 87 mg/dL (ref 65–99)
Glucose-Capillary: 112 mg/dL — ABNORMAL HIGH (ref 65–99)
Glucose-Capillary: 65 mg/dL (ref 65–99)
Glucose-Capillary: 70 mg/dL (ref 65–99)
Glucose-Capillary: 89 mg/dL (ref 65–99)
Glucose-Capillary: 143 mg/dL — ABNORMAL HIGH (ref 65–99)

## 2017-11-21 SURGERY — LOWER EXTREMITY ANGIOGRAPHY
Anesthesia: LOCAL

## 2017-11-21 MED ORDER — HEPARIN (PORCINE) IN NACL 2-0.9 UNITS/ML
INTRAMUSCULAR | Status: AC | PRN
  Administered 2017-11-21: 500 mL via INTRA_ARTERIAL

## 2017-11-21 MED ORDER — MIDAZOLAM HCL 2 MG/2ML IJ SOLN
INTRAMUSCULAR | Status: AC
  Filled 2017-11-21: qty 2

## 2017-11-21 MED ORDER — CLOPIDOGREL BISULFATE 75 MG PO TABS
75.0000 mg | ORAL_TABLET | Freq: Every day | ORAL | Status: DC
  Administered 2017-11-22: 75 mg via ORAL
  Filled 2017-11-21: qty 1

## 2017-11-21 MED ORDER — FENTANYL CITRATE (PF) 100 MCG/2ML IJ SOLN
INTRAMUSCULAR | Status: DC | PRN
  Administered 2017-11-21: 50 ug via INTRAVENOUS
  Administered 2017-11-21 (×2): 25 ug via INTRAVENOUS

## 2017-11-21 MED ORDER — IODIXANOL 320 MG/ML IV SOLN
INTRAVENOUS | Status: DC | PRN
  Administered 2017-11-21: 150 mL via INTRA_ARTERIAL

## 2017-11-21 MED ORDER — SODIUM CHLORIDE 0.9% FLUSH: 3.0000 mL | INTRAVENOUS | Status: DC | PRN

## 2017-11-21 MED ORDER — DEXTROSE 50 % IV SOLN
INTRAVENOUS | Status: DC | PRN
  Administered 2017-11-21: 25 mL via INTRAVENOUS

## 2017-11-21 MED ORDER — SODIUM CHLORIDE 0.9 % IV SOLN
INTRAVENOUS | Status: DC
  Administered 2017-11-21: 10:00:00 via INTRAVENOUS

## 2017-11-21 MED ORDER — SODIUM CHLORIDE 0.9 % IV SOLN: 250.0000 mL | INTRAVENOUS | Status: DC | PRN

## 2017-11-21 MED ORDER — HYDRALAZINE HCL 20 MG/ML IJ SOLN: 5.0000 mg | INTRAMUSCULAR | Status: DC | PRN

## 2017-11-21 MED ORDER — SODIUM CHLORIDE 0.9% FLUSH
3.0000 mL | Freq: Two times a day (BID) | INTRAVENOUS | Status: DC
  Administered 2017-11-22: 3 mL via INTRAVENOUS

## 2017-11-21 MED ORDER — SODIUM CHLORIDE 0.9% FLUSH: 3.0000 mL | Freq: Two times a day (BID) | INTRAVENOUS | Status: DC

## 2017-11-21 MED ORDER — DEXTROSE 50 % IV SOLN: 1.0000 | Freq: Once | INTRAVENOUS | Status: DC

## 2017-11-21 MED ORDER — OXYCODONE HCL 5 MG PO TABS: 5.0000 mg | ORAL_TABLET | ORAL | Status: DC | PRN

## 2017-11-21 MED ORDER — FENTANYL CITRATE (PF) 100 MCG/2ML IJ SOLN
INTRAMUSCULAR | Status: AC
  Filled 2017-11-21: qty 2

## 2017-11-21 MED ORDER — DEXTROSE 50 % IV SOLN
INTRAVENOUS | Status: AC
  Administered 2017-11-21: 25 mL via INTRAVENOUS
  Filled 2017-11-21: qty 50

## 2017-11-21 MED ORDER — SODIUM CHLORIDE 0.9 % IV SOLN
250.0000 mL | INTRAVENOUS | Status: DC | PRN
  Administered 2017-12-03: 250 mL via INTRAVENOUS

## 2017-11-21 MED ORDER — HEPARIN SODIUM (PORCINE) 1000 UNIT/ML IJ SOLN
INTRAMUSCULAR | Status: AC
  Filled 2017-11-21: qty 1

## 2017-11-21 MED ORDER — LABETALOL HCL 5 MG/ML IV SOLN: 10.0000 mg | INTRAVENOUS | Status: DC | PRN

## 2017-11-21 MED ORDER — HEPARIN (PORCINE) IN NACL 1000-0.9 UT/500ML-% IV SOLN
INTRAVENOUS | Status: AC
  Filled 2017-11-21: qty 500

## 2017-11-21 MED ORDER — ASPIRIN 81 MG PO CHEW
CHEWABLE_TABLET | ORAL | Status: AC
  Filled 2017-11-21: qty 1

## 2017-11-21 MED ORDER — DEXTROSE 50 % IV SOLN
INTRAVENOUS | Status: AC
  Filled 2017-11-21: qty 50

## 2017-11-21 MED ORDER — MORPHINE SULFATE (PF) 4 MG/ML IV SOLN
INTRAVENOUS | Status: AC
  Filled 2017-11-21: qty 1

## 2017-11-21 MED ORDER — ONDANSETRON HCL 4 MG/2ML IJ SOLN
4.0000 mg | Freq: Four times a day (QID) | INTRAMUSCULAR | Status: DC | PRN
  Administered 2017-11-21 – 2017-11-22 (×2): 4 mg via INTRAVENOUS
  Filled 2017-11-21 (×2): qty 2

## 2017-11-21 MED ORDER — MORPHINE SULFATE (PF) 10 MG/ML IV SOLN
2.0000 mg | INTRAVENOUS | Status: DC | PRN
  Administered 2017-11-21 (×2): 2 mg via INTRAVENOUS

## 2017-11-21 MED ORDER — CLOPIDOGREL BISULFATE 75 MG PO TABS: 75.0000 mg | ORAL_TABLET | Freq: Every day | ORAL | 11 refills | Status: AC

## 2017-11-21 MED ORDER — CLOPIDOGREL BISULFATE 300 MG PO TABS: 300.0000 mg | ORAL_TABLET | Freq: Once | ORAL | Status: DC

## 2017-11-21 MED ORDER — MORPHINE SULFATE (PF) 4 MG/ML IV SOLN
1.0000 mg | INTRAVENOUS | Status: DC | PRN
  Administered 2017-11-22: 1 mg via INTRAVENOUS
  Filled 2017-11-21: qty 1

## 2017-11-21 MED ORDER — DEXTROSE 50 % IV SOLN
25.0000 mL | Freq: Once | INTRAVENOUS | Status: AC
  Administered 2017-11-21: 25 mL via INTRAVENOUS

## 2017-11-21 MED ORDER — ONDANSETRON HCL 4 MG/2ML IJ SOLN: 4.0000 mg | Freq: Four times a day (QID) | INTRAMUSCULAR | Status: DC | PRN

## 2017-11-21 MED ORDER — ACETAMINOPHEN 325 MG PO TABS: 650.0000 mg | ORAL_TABLET | ORAL | Status: DC | PRN

## 2017-11-21 MED ORDER — CLOPIDOGREL BISULFATE 300 MG PO TABS
ORAL_TABLET | ORAL | Status: DC | PRN
  Administered 2017-11-21: 300 mg via ORAL

## 2017-11-21 MED ORDER — ASPIRIN EC 81 MG PO TBEC
81.0000 mg | DELAYED_RELEASE_TABLET | Freq: Every day | ORAL | Status: DC
  Administered 2017-11-21 – 2017-11-22 (×2): 81 mg via ORAL
  Filled 2017-11-21 (×2): qty 1

## 2017-11-21 MED ORDER — HEPARIN SODIUM (PORCINE) 1000 UNIT/ML IJ SOLN
INTRAMUSCULAR | Status: DC | PRN
  Administered 2017-11-21 (×2): 2000 [IU] via INTRAVENOUS
  Administered 2017-11-21: 7000 [IU] via INTRAVENOUS

## 2017-11-21 MED ORDER — SODIUM CHLORIDE 0.9 % WEIGHT BASED INFUSION: 1.0000 mL/kg/h | INTRAVENOUS | Status: AC

## 2017-11-21 MED ORDER — MIDAZOLAM HCL 2 MG/2ML IJ SOLN
INTRAMUSCULAR | Status: DC | PRN
  Administered 2017-11-21 (×2): 1 mg via INTRAVENOUS
  Administered 2017-11-21: 2 mg via INTRAVENOUS

## 2017-11-21 MED ORDER — SODIUM CHLORIDE 0.9 % WEIGHT BASED INFUSION: 1.0000 mL/kg/h | INTRAVENOUS | Status: DC

## 2017-11-21 MED ORDER — ONDANSETRON HCL 4 MG/2ML IJ SOLN
INTRAMUSCULAR | Status: AC
  Filled 2017-11-21: qty 2

## 2017-11-21 MED ORDER — DEXTROSE 50 % IV SOLN
1.0000 | Freq: Once | INTRAVENOUS | Status: AC
  Administered 2017-11-21: 25 mL via INTRAVENOUS

## 2017-11-21 MED ORDER — LIDOCAINE HCL (PF) 1 % IJ SOLN
INTRAMUSCULAR | Status: AC
  Filled 2017-11-21: qty 30

## 2017-11-21 MED ORDER — LIDOCAINE HCL (PF) 1 % IJ SOLN
INTRAMUSCULAR | Status: DC | PRN
  Administered 2017-11-21: 10 mL
  Administered 2017-11-21: 30 mL

## 2017-11-21 SURGICAL SUPPLY — 25 items
BALLN COYOTE OTW 3X150X150 (BALLOONS) ×3
BALLN MUSTANG 5X60X135 (BALLOONS) ×3
BALLOON COYOTE OTW 3X150X150 (BALLOONS) IMPLANT
BALLOON MUSTANG 5X60X135 (BALLOONS) IMPLANT
CATH OMNI FLUSH 5F 65CM (CATHETERS) ×1 IMPLANT
CATH QUICKCROSS ANG SELECT (CATHETERS) ×1 IMPLANT
CATH QUICKCROSS SUPP .035X90CM (MICROCATHETER) ×1 IMPLANT
CATH SOFTOUCH MOTARJEME 5F (CATHETERS) ×1 IMPLANT
COVER PRB 48X5XTLSCP FOLD TPE (BAG) IMPLANT
COVER PROBE 5X48 (BAG) ×9
DRAPE ZERO GRAVITY STERILE (DRAPES) ×1 IMPLANT
KIT ENCORE 26 ADVANTAGE (KITS) ×1 IMPLANT
KIT MICROPUNCTURE NIT STIFF (SHEATH) ×2 IMPLANT
KIT PV (KITS) ×3 IMPLANT
SHEATH PINNACLE 5F 10CM (SHEATH) ×1 IMPLANT
SHEATH PINNACLE 6F 10CM (SHEATH) ×1 IMPLANT
SHEATH PINNACLE ST 6F 45CM (SHEATH) ×1 IMPLANT
STENT ELUVIA 6X80X130 (Permanent Stent) ×1 IMPLANT
SYR MEDRAD MARK V 150ML (SYRINGE) ×3 IMPLANT
TRANSDUCER W/STOPCOCK (MISCELLANEOUS) ×3 IMPLANT
TRAY PV CATH (CUSTOM PROCEDURE TRAY) ×3 IMPLANT
WIRE BENTSON .035X145CM (WIRE) ×1 IMPLANT
WIRE COUGAR XT STRL 300CM (WIRE) ×1 IMPLANT
WIRE G V18X300CM (WIRE) ×1 IMPLANT
WIRE HI TORQ VERSACORE J 260CM (WIRE) ×1 IMPLANT

## 2017-11-21 NOTE — Progress Notes (Signed)
Site area: Left  Groin a 6 french long sheath antegrade was removed.  Site Prior to Removal:  Level 0  Pressure Applied For 20 MINUTES    Bedrest Beginning at 1745p  Manual:   Yes.    Patient Status During Pull:  stable  Post Pull Groin Site:  Level 0  Post Pull Instructions Given:  Yes.    Post Pull Pulses Present:  Yes.    Dressing Applied:  Yes.    Comments:  Dr Edilia Bo in to see pt and admitt her.l

## 2017-11-21 NOTE — Op Note (Signed)
Patient name: [name] MRN: [id number] DOB: 08/21/1945 Sex: female  11/01/2017 Pre-operative Diagnosis: left leg ulcer Post-operative diagnosis:  Same Surgeon:  [name] Procedure Performed:  1.  Ultrasound-guided access, right femoral artery  2.  Ultrasound-guided access, left femoral artery  3.  Abdominal aortogram  4.  Left lower extremity runoff  5.  Stent, left superficial femoral artery  6.  Balloon angioplasty, left peroneal and tibioperoneal trunk  7.  Conscious sedation (118 minutes)   Indications: The patient has had multiple interventions for lower extremity occlusive disease.  She now has a nonhealing wound with ultrasound indicating stenosis within her femoral-popliteal bypass graft.  She is here today for further evaluation and possible intervention.  Procedure:  The patient was identified in the holding area and taken to room 8.  The patient was then placed supine on the table and prepped and draped in the usual sterile fashion.  A time out was called.  Conscious sedation was administered with the use of IV fentanyl and Versed under continuous physician and nurse monitoring.  Heart rate, blood pressure, and oxygen saturations were continuously monitored.  Ultrasound was used to evaluate the right common femoral artery.  It was patent .  A digital ultrasound image was acquired.  A micropuncture needle was used to access the right common femoral artery under ultrasound guidance.  An 018 wire was advanced without resistance and a micropuncture sheath was placed.  The 018 wire was removed and a benson wire was placed.  The micropuncture sheath was exchanged for a 5 french sheath.  An omniflush catheter was advanced over the wire to the level of L-1.  An abdominal angiogram was obtained.  A Motarjeme catheter, it was placed into the left common iliac artery left leg runoff was performed. Findings:   Aortogram: No significant renal artery stenosis was identified.  There is  an aortobifemoral bypass graft.  The proximal anastomosis is widely patent as are bilateral iliac limbs.  Right Lower Extremity: Not evaluated  Left Lower Extremity: The left common femoral artery is patent as is the limb of the aortobifemoral graft.  The profunda and superficial femoral artery are occluded.  There is a bypass graft originating from the common femoral artery down to the above-knee popliteal artery.  The proximal anastomosis is widely patent.  There is luminal irregularity at the distal anastomosis.  There is significant disease within the above-knee popliteal artery with several areas greater than 80%.  The below-knee popliteal artery appears to be small but normal caliber.  There is occlusion of all 3 tibial vessels.  The dominant runoff is the peroneal artery.  There is distal reconstitution of the posterior tibial artery.  Intervention: After the above images were acquired the decision was made to proceed with intervention.  Ultrasound was used to evaluate the left groin.  The graft was visualized and patent.  1% lidocaine was used for local anesthesia.  A digital ultrasound image was acquired.  The left femoral artery was then cannulated under ultrasound guidance in an antegrade fashion with a micropuncture needle.  A wire was advanced and superficial femoral artery followed by micropuncture sheath.  A versa core wire was then placed followed by a 6 French 45 cm sheath.  The patient was not fully heparinized.  I used a 014 wire to cross the stenosis within the popliteal artery.  I then used a 3 mm x 15 mm balloon to perform balloon angioplasty of the popliteal artery from the joint  space up into the bypass graft.  After this was done there was significant luminal leg clarity and dissection which I did not feel that could be left.  I elected to stent this.  I had to place an 035 wire in order to get the stent across the distal anastomosis of the femoral-popliteal bypass graft.  I selected a 6  x 80 drug-coated Elluvia stent and postdilated with a 5 mm balloon.  I then used several different 014 wires to try to cross the peroneal artery occlusion.  Ultimately, a B-18 wire was successful.  I then inserted a 3 x 150 coyote balloon and performed balloon angioplasty of the peroneal artery and tibioperoneal trunk.  The balloon was taken to nominal pressure for 2 minutes on 2 separate inflations.  Completion imaging study was then performed which showed excellent in-line flow through the peroneal artery down across the ankle.  At this point catheters and wires were removed.  The patient was taken over here for sheath pull once the coag profile corrects.  Impression:  #1  Occluded left superficial femoral and profunda femoral artery.  #2  Patent right femoral above-knee popliteal bypass graft with diffuse long segment stenosis within the outflow above the popliteal artery.  This did not respond to balloon angioplasty and was ultimately treated with drug-coated stenting using a 6 mm stent.  #3  Occlusion of all 3 tibial vessels.  I was able to recanalize the peroneal artery and performed balloon angioplasty using a 3 mm balloon.    Juleen China, M.D. [facility] [place] Office: [phone] Pager:  [phone]

## 2017-11-21 NOTE — Progress Notes (Signed)
Site area: Right groin a 5 french arterial sheath was removed   Site Prior to Removal:  Level 1  Pressure Applied For 45 MINUTES    Bedrest Beginning at 1745pm  Manual:   Yes.    Patient Status During Pull:  stable  Post Pull Groin Site:  Level 1  Post Pull Instructions Given:  Yes.    Post Pull Pulses Present:  Yes.    Dressing Applied:  Yes.    Comments:  Pt vagal and IVF bolus given and pt put in trendelenburg  BP 100/56

## 2017-11-21 NOTE — Progress Notes (Signed)
   VASCULAR SURGERY ASSESSMENT & PLAN:   The patient had some hematoma in the right groin after femoral sheath was pulled. She also had some transient hypotenison. Groin is soft now and BP improved but I think that the safest thing is to admit her overnight for observation.   Check CBC and BMET in AM.   SUBJECTIVE:   Currently no significant right groin pain.   PHYSICAL EXAM:   Vitals:   11/08/2017 1357 11/18/2017 1440 11/26/2017 1445 11/27/2017 1450  BP: 104/66 94/70 (!) 119/91   Pulse: 72 63  (!) 177  Resp: (!) 31 17 18  (!) 22  Temp:      TempSrc:      SpO2: 98% (!) 89%  92%  Weight:      Height:       Right groin with eccymosis. Soft. No bruit Left foot is warm.  LABS:   Lab Results  Component Value Date   WBC 11.3 (H) 12/14/2015   HGB 11.2 (L) 11/24/2017   HCT 33.0 (L) 11/11/2017   MCV 82.3 12/14/2015   PLT 353 12/14/2015   Lab Results  Component Value Date   CREATININE 0.90 12/01/2017   Lab Results  Component Value Date   INR 1.60 (H) 12/11/2015   CBG (last 3)  Recent Labs    11/13/2017 1635 11/02/2017 1744 11/18/2017 1801  GLUCAP 112* 65 70    PROBLEM LIST:    Active Problems:   * No active hospital problems. *   CURRENT MEDS:   . aspirin EC  81 mg Oral Daily  . clopidogrel  300 mg Oral Once   Followed by  . [START ON 11/09/2017] clopidogrel  75 mg Oral Q breakfast  . dextrose  1 ampule Intravenous Once  . [START ON 11/29/2017] sodium chloride flush  3 mL Intravenous Q12H    Waverly Ferrari Beeper: [phone] Office: [phone] 11/01/2017

## 2017-11-21 NOTE — Interval H&P Note (Signed)
History and Physical Interval Note:  11/06/2017 11:02 AM  [name]  has presented today for surgery, with the diagnosis of pvd w/ claudication  The various methods of treatment have been discussed with the patient and family. After consideration of risks, benefits and other options for treatment, the patient has consented to  Procedure(s): LOWER EXTREMITY ANGIOGRAPHY (N/A) as a surgical intervention .  The patient's history has been reviewed, patient examined, no change in status, stable for surgery.  I have reviewed the patient's chart and labs.  Questions were answered to the patient's satisfaction.     [name]

## 2017-11-22 ENCOUNTER — Encounter (HOSPITAL_COMMUNITY): Admission: RE | Disposition: E | Payer: Self-pay | Source: Home / Self Care | Attending: Surgery

## 2017-11-22 ENCOUNTER — Observation Stay (HOSPITAL_COMMUNITY): Payer: Medicare Other | Admitting: Anesthesiology

## 2017-11-22 ENCOUNTER — Encounter (HOSPITAL_COMMUNITY): Payer: Self-pay | Admitting: Surgery

## 2017-11-22 ENCOUNTER — Other Ambulatory Visit: Payer: Self-pay

## 2017-11-22 ENCOUNTER — Inpatient Hospital Stay (HOSPITAL_COMMUNITY): Payer: Medicare Other

## 2017-11-22 DIAGNOSIS — J9601 Acute respiratory failure with hypoxia: Secondary | ICD-10-CM

## 2017-11-22 DIAGNOSIS — I219 Acute myocardial infarction, unspecified: Secondary | ICD-10-CM | POA: Diagnosis present

## 2017-11-22 DIAGNOSIS — I7777 Dissection of artery of lower extremity: Secondary | ICD-10-CM | POA: Diagnosis not present

## 2017-11-22 DIAGNOSIS — D62 Acute posthemorrhagic anemia: Secondary | ICD-10-CM | POA: Diagnosis not present

## 2017-11-22 DIAGNOSIS — N390 Urinary tract infection, site not specified: Secondary | ICD-10-CM | POA: Diagnosis not present

## 2017-11-22 DIAGNOSIS — K567 Ileus, unspecified: Secondary | ICD-10-CM | POA: Diagnosis not present

## 2017-11-22 DIAGNOSIS — N179 Acute kidney failure, unspecified: Secondary | ICD-10-CM | POA: Diagnosis not present

## 2017-11-22 DIAGNOSIS — J9611 Chronic respiratory failure with hypoxia: Secondary | ICD-10-CM | POA: Diagnosis not present

## 2017-11-22 DIAGNOSIS — I97638 Postprocedural hematoma of a circulatory system organ or structure following other circulatory system procedure: Secondary | ICD-10-CM | POA: Diagnosis not present

## 2017-11-22 DIAGNOSIS — R112 Nausea with vomiting, unspecified: Secondary | ICD-10-CM | POA: Diagnosis not present

## 2017-11-22 DIAGNOSIS — E43 Unspecified severe protein-calorie malnutrition: Secondary | ICD-10-CM | POA: Diagnosis not present

## 2017-11-22 DIAGNOSIS — E878 Other disorders of electrolyte and fluid balance, not elsewhere classified: Secondary | ICD-10-CM | POA: Diagnosis not present

## 2017-11-22 DIAGNOSIS — I110 Hypertensive heart disease with heart failure: Secondary | ICD-10-CM | POA: Diagnosis present

## 2017-11-22 DIAGNOSIS — E1151 Type 2 diabetes mellitus with diabetic peripheral angiopathy without gangrene: Secondary | ICD-10-CM | POA: Diagnosis present

## 2017-11-22 DIAGNOSIS — I9789 Other postprocedural complications and disorders of the circulatory system, not elsewhere classified: Secondary | ICD-10-CM | POA: Diagnosis not present

## 2017-11-22 DIAGNOSIS — J95821 Acute postprocedural respiratory failure: Secondary | ICD-10-CM | POA: Diagnosis not present

## 2017-11-22 DIAGNOSIS — I97711 Intraoperative cardiac arrest during other surgery: Secondary | ICD-10-CM | POA: Diagnosis not present

## 2017-11-22 DIAGNOSIS — T82858A Stenosis of vascular prosthetic devices, implants and grafts, initial encounter: Secondary | ICD-10-CM | POA: Diagnosis present

## 2017-11-22 DIAGNOSIS — Z978 Presence of other specified devices: Secondary | ICD-10-CM | POA: Diagnosis not present

## 2017-11-22 DIAGNOSIS — G9340 Encephalopathy, unspecified: Secondary | ICD-10-CM | POA: Diagnosis not present

## 2017-11-22 DIAGNOSIS — R578 Other shock: Secondary | ICD-10-CM

## 2017-11-22 DIAGNOSIS — E1152 Type 2 diabetes mellitus with diabetic peripheral angiopathy with gangrene: Secondary | ICD-10-CM | POA: Diagnosis present

## 2017-11-22 DIAGNOSIS — I739 Peripheral vascular disease, unspecified: Secondary | ICD-10-CM

## 2017-11-22 DIAGNOSIS — L97929 Non-pressure chronic ulcer of unspecified part of left lower leg with unspecified severity: Secondary | ICD-10-CM | POA: Diagnosis present

## 2017-11-22 DIAGNOSIS — I7300 Raynaud's syndrome without gangrene: Secondary | ICD-10-CM | POA: Diagnosis present

## 2017-11-22 DIAGNOSIS — R0902 Hypoxemia: Secondary | ICD-10-CM | POA: Diagnosis not present

## 2017-11-22 DIAGNOSIS — R6521 Severe sepsis with septic shock: Secondary | ICD-10-CM | POA: Diagnosis not present

## 2017-11-22 DIAGNOSIS — E872 Acidosis: Secondary | ICD-10-CM | POA: Diagnosis not present

## 2017-11-22 DIAGNOSIS — E870 Hyperosmolality and hypernatremia: Secondary | ICD-10-CM | POA: Diagnosis present

## 2017-11-22 DIAGNOSIS — A419 Sepsis, unspecified organism: Secondary | ICD-10-CM | POA: Diagnosis not present

## 2017-11-22 DIAGNOSIS — J690 Pneumonitis due to inhalation of food and vomit: Secondary | ICD-10-CM | POA: Diagnosis not present

## 2017-11-22 DIAGNOSIS — K2210 Ulcer of esophagus without bleeding: Secondary | ICD-10-CM | POA: Diagnosis not present

## 2017-11-22 HISTORY — PX: FEMORAL ARTERY EXPLORATION: SHX5160

## 2017-11-22 LAB — CBC
HEMATOCRIT: 26.4 % — AB (ref 36.0–46.0)
HEMATOCRIT: 20.6 % — AB (ref 36.0–46.0)
HEMOGLOBIN: 6.5 g/dL — AB (ref 12.0–15.0)
Hemoglobin: 8.3 g/dL — ABNORMAL LOW (ref 12.0–15.0)
MCH: 27.4 pg (ref 26.0–34.0)
MCH: 27.7 pg (ref 26.0–34.0)
MCHC: 31.4 g/dL (ref 30.0–36.0)
MCHC: 31.6 g/dL (ref 30.0–36.0)
MCV: 87.1 fL (ref 78.0–100.0)
MCV: 87.7 fL (ref 78.0–100.0)
Platelets: 320 10*3/uL (ref 150–400)
Platelets: 365 10*3/uL (ref 150–400)
RBC: 3.03 MIL/uL — ABNORMAL LOW (ref 3.87–5.11)
RBC: 2.35 MIL/uL — ABNORMAL LOW (ref 3.87–5.11)
RDW: 14.6 % (ref 11.5–15.5)
RDW: 14.7 % (ref 11.5–15.5)
WBC: 8 10*3/uL (ref 4.0–10.5)
WBC: 7.3 10*3/uL (ref 4.0–10.5)

## 2017-11-22 LAB — POCT I-STAT 4, (NA,K, GLUC, HGB,HCT)
Glucose, Bld: 305 mg/dL — ABNORMAL HIGH (ref 65–99)
HEMATOCRIT: 40 % (ref 36.0–46.0)
Hemoglobin: 13.6 g/dL (ref 12.0–15.0)
Potassium: 4.5 mmol/L (ref 3.5–5.1)
Sodium: 141 mmol/L (ref 135–145)

## 2017-11-22 LAB — BASIC METABOLIC PANEL WITH GFR
Anion gap: 6 (ref 5–15)
BUN: 11 mg/dL (ref 6–20)
CO2: 16 mmol/L — ABNORMAL LOW (ref 22–32)
Calcium: 6.8 mg/dL — ABNORMAL LOW (ref 8.9–10.3)
Chloride: 116 mmol/L — ABNORMAL HIGH (ref 101–111)
Creatinine, Ser: 1.21 mg/dL — ABNORMAL HIGH (ref 0.44–1.00)
GFR calc Af Amer: 51 mL/min — ABNORMAL LOW
GFR calc non Af Amer: 44 mL/min — ABNORMAL LOW
Glucose, Bld: 329 mg/dL — ABNORMAL HIGH (ref 65–99)
Potassium: 4.4 mmol/L (ref 3.5–5.1)
Sodium: 138 mmol/L (ref 135–145)

## 2017-11-22 LAB — POCT I-STAT 3, VENOUS BLOOD GAS (G3P V)
Acid-base deficit: 14 mmol/L — ABNORMAL HIGH (ref 0.0–2.0)
Bicarbonate: 15.7 mmol/L — ABNORMAL LOW (ref 20.0–28.0)
O2 Saturation: 47 %
Patient temperature: 96.4
TCO2: 17 mmol/L — ABNORMAL LOW (ref 22–32)
pCO2, Ven: 45.7 mmHg (ref 44.0–60.0)
pH, Ven: 7.137 — CL (ref 7.250–7.430)
pO2, Ven: 31 mmHg — CL (ref 32.0–45.0)

## 2017-11-22 LAB — GLUCOSE, CAPILLARY
GLUCOSE-CAPILLARY: 130 mg/dL — AB (ref 65–99)
Glucose-Capillary: 179 mg/dL — ABNORMAL HIGH (ref 65–99)
Glucose-Capillary: 278 mg/dL — ABNORMAL HIGH (ref 65–99)

## 2017-11-22 LAB — BASIC METABOLIC PANEL
Anion gap: 7 (ref 5–15)
BUN: 9 mg/dL (ref 6–20)
CHLORIDE: 107 mmol/L (ref 101–111)
CO2: 25 mmol/L (ref 22–32)
Calcium: 7.6 mg/dL — ABNORMAL LOW (ref 8.9–10.3)
Creatinine, Ser: 0.94 mg/dL (ref 0.44–1.00)
GFR calc Af Amer: >60 mL/min (ref >60)
GFR calc non Af Amer: 59 mL/min — ABNORMAL LOW (ref >60)
Glucose, Bld: 202 mg/dL — ABNORMAL HIGH (ref 65–99)
POTASSIUM: 3.8 mmol/L (ref 3.5–5.1)
Sodium: 139 mmol/L (ref 135–145)

## 2017-11-22 LAB — PREPARE RBC (CROSSMATCH)

## 2017-11-22 SURGERY — EXPLORATION, ARTERY, FEMORAL
Anesthesia: General | Site: Groin | Laterality: Right

## 2017-11-22 MED ORDER — SODIUM CHLORIDE 0.9 % IV SOLN
INTRAVENOUS | Status: AC
  Filled 2017-11-22: qty 1.2

## 2017-11-22 MED ORDER — VANCOMYCIN HCL IN DEXTROSE 1-5 GM/200ML-% IV SOLN
INTRAVENOUS | Status: AC
  Filled 2017-11-22: qty 200

## 2017-11-22 MED ORDER — ACETAMINOPHEN 325 MG RE SUPP: 325.0000 mg | RECTAL | Status: DC | PRN

## 2017-11-22 MED ORDER — MAGNESIUM SULFATE 2 GM/50ML IV SOLN: 2.0000 g | Freq: Every day | INTRAVENOUS | Status: DC | PRN

## 2017-11-22 MED ORDER — SODIUM CHLORIDE 0.9 % IV SOLN
INTRAVENOUS | Status: DC
Start: 2017-11-22 — End: 2017-11-23
  Administered 2017-11-23: 01:00:00 via INTRAVENOUS

## 2017-11-22 MED ORDER — CALCIUM CHLORIDE 10 % IV SOLN
INTRAVENOUS | Status: DC | PRN
  Administered 2017-11-22 (×2): 200 mg via INTRAVENOUS
  Administered 2017-11-22: 300 mg via INTRAVENOUS

## 2017-11-22 MED ORDER — BISACODYL 10 MG RE SUPP: 10.0000 mg | Freq: Every day | RECTAL | Status: DC | PRN

## 2017-11-22 MED ORDER — FENTANYL CITRATE (PF) 100 MCG/2ML IJ SOLN: 50.0000 ug | INTRAMUSCULAR | Status: DC | PRN

## 2017-11-22 MED ORDER — FENTANYL CITRATE (PF) 250 MCG/5ML IJ SOLN
INTRAMUSCULAR | Status: DC | PRN
  Administered 2017-11-22: 100 ug via INTRAVENOUS
  Administered 2017-11-22: 50 ug via INTRAVENOUS

## 2017-11-22 MED ORDER — HYDRALAZINE HCL 20 MG/ML IJ SOLN: 5.0000 mg | INTRAMUSCULAR | Status: DC | PRN

## 2017-11-22 MED ORDER — FENTANYL CITRATE (PF) 250 MCG/5ML IJ SOLN
INTRAMUSCULAR | Status: AC
  Filled 2017-11-22: qty 5

## 2017-11-22 MED ORDER — MIDAZOLAM HCL 2 MG/2ML IJ SOLN
INTRAMUSCULAR | Status: AC
  Filled 2017-11-22: qty 2

## 2017-11-22 MED ORDER — DOCUSATE SODIUM 100 MG PO CAPS: 100.0000 mg | ORAL_CAPSULE | Freq: Every day | ORAL | Status: DC

## 2017-11-22 MED ORDER — METOPROLOL TARTRATE 5 MG/5ML IV SOLN: 2.0000 mg | INTRAVENOUS | Status: DC | PRN

## 2017-11-22 MED ORDER — SODIUM CHLORIDE 0.9 % IV SOLN: Freq: Once | INTRAVENOUS | Status: DC

## 2017-11-22 MED ORDER — VANCOMYCIN HCL 1000 MG IV SOLR
INTRAVENOUS | Status: DC | PRN
  Administered 2017-11-22: 1000 mg via INTRAVENOUS

## 2017-11-22 MED ORDER — SODIUM CHLORIDE 0.9 % IV SOLN
INTRAVENOUS | Status: DC | PRN
  Administered 2017-11-22: 13:00:00

## 2017-11-22 MED ORDER — FAMOTIDINE IN NACL 20-0.9 MG/50ML-% IV SOLN: 20.0000 mg | Freq: Two times a day (BID) | INTRAVENOUS | Status: DC

## 2017-11-22 MED ORDER — POTASSIUM CHLORIDE CRYS ER 20 MEQ PO TBCR: 20.0000 meq | EXTENDED_RELEASE_TABLET | Freq: Every day | ORAL | Status: DC | PRN

## 2017-11-22 MED ORDER — LACTATED RINGERS IV SOLN
INTRAVENOUS | Status: DC | PRN
  Administered 2017-11-22 (×2): via INTRAVENOUS

## 2017-11-22 MED ORDER — NOREPINEPHRINE 4 MG/250ML-% IV SOLN
0.0000 ug/min | INTRAVENOUS | Status: DC
  Filled 2017-11-22: qty 250

## 2017-11-22 MED ORDER — MORPHINE SULFATE (PF) 2 MG/ML IV SOLN: 2.0000 mg | INTRAVENOUS | Status: DC | PRN

## 2017-11-22 MED ORDER — GUAIFENESIN-DM 100-10 MG/5ML PO SYRP: 15.0000 mL | ORAL_SOLUTION | ORAL | Status: DC | PRN

## 2017-11-22 MED ORDER — CHLORHEXIDINE GLUCONATE 0.12% ORAL RINSE (MEDLINE KIT): 15.0000 mL | Freq: Two times a day (BID) | OROMUCOSAL | Status: DC

## 2017-11-22 MED ORDER — FENTANYL CITRATE (PF) 100 MCG/2ML IJ SOLN
12.5000 ug | INTRAMUSCULAR | Status: DC | PRN
  Administered 2017-11-22: 25 ug via INTRAVENOUS
  Filled 2017-11-22: qty 2

## 2017-11-22 MED ORDER — CLOPIDOGREL BISULFATE 300 MG PO TABS
ORAL_TABLET | ORAL | Status: AC
  Filled 2017-11-22: qty 1

## 2017-11-22 MED ORDER — PHENOL 1.4 % MT LIQD: 1.0000 | OROMUCOSAL | Status: DC | PRN

## 2017-11-22 MED ORDER — ALUM & MAG HYDROXIDE-SIMETH 200-200-20 MG/5ML PO SUSP: 15.0000 mL | ORAL | Status: DC | PRN

## 2017-11-22 MED ORDER — NOREPINEPHRINE 16 MG/250ML-% IV SOLN
0.0000 ug/min | INTRAVENOUS | Status: DC
  Administered 2017-11-22: 5 ug/min via INTRAVENOUS
  Administered 2017-11-24: 2 ug/min via INTRAVENOUS
  Filled 2017-11-22: qty 250

## 2017-11-22 MED ORDER — SODIUM CHLORIDE 0.9 % IV BOLUS
500.0000 mL | Freq: Once | INTRAVENOUS | Status: AC
  Administered 2017-11-22: 500 mL via INTRAVENOUS

## 2017-11-22 MED ORDER — SODIUM CHLORIDE 0.9 % IV SOLN
Freq: Once | INTRAVENOUS | Status: AC
  Administered 2017-11-22: 16:00:00 via INTRAVENOUS

## 2017-11-22 MED ORDER — ASPIRIN EC 81 MG PO TBEC: 81.0000 mg | DELAYED_RELEASE_TABLET | Freq: Every day | ORAL | Status: DC

## 2017-11-22 MED ORDER — POLYETHYLENE GLYCOL 3350 17 G PO PACK: 17.0000 g | PACK | Freq: Every day | ORAL | Status: DC | PRN

## 2017-11-22 MED ORDER — 0.9 % SODIUM CHLORIDE (POUR BTL) OPTIME
TOPICAL | Status: DC | PRN
  Administered 2017-11-22: 2000 mL

## 2017-11-22 MED ORDER — PHENYLEPHRINE HCL-NACL 10-0.9 MG/250ML-% IV SOLN: 0.0000 ug/min | INTRAVENOUS | Status: DC

## 2017-11-22 MED ORDER — INSULIN ASPART 100 UNIT/ML ~~LOC~~ SOLN
0.0000 [IU] | Freq: Three times a day (TID) | SUBCUTANEOUS | Status: DC
  Administered 2017-11-22: 5 [IU] via SUBCUTANEOUS

## 2017-11-22 MED ORDER — CALCIUM CARBONATE ANTACID 500 MG PO CHEW
1.0000 | CHEWABLE_TABLET | Freq: Three times a day (TID) | ORAL | Status: DC | PRN
  Administered 2017-11-22 – 2017-11-26 (×3): 200 mg via ORAL
  Filled 2017-11-22 (×3): qty 1

## 2017-11-22 MED ORDER — INSULIN ASPART 100 UNIT/ML ~~LOC~~ SOLN
0.0000 [IU] | SUBCUTANEOUS | Status: DC
  Administered 2017-11-22: 8 [IU] via SUBCUTANEOUS
  Administered 2017-11-22 – 2017-11-23 (×3): 2 [IU] via SUBCUTANEOUS
  Administered 2017-11-24 (×2): 3 [IU] via SUBCUTANEOUS
  Administered 2017-11-25: 2 [IU] via SUBCUTANEOUS
  Administered 2017-11-26: 3 [IU] via SUBCUTANEOUS
  Administered 2017-11-26: 5 [IU] via SUBCUTANEOUS
  Administered 2017-11-26 – 2017-11-27 (×4): 2 [IU] via SUBCUTANEOUS
  Administered 2017-11-27 (×4): 3 [IU] via SUBCUTANEOUS
  Administered 2017-11-28 – 2017-11-29 (×5): 2 [IU] via SUBCUTANEOUS
  Administered 2017-11-29 (×2): 3 [IU] via SUBCUTANEOUS
  Administered 2017-11-29 – 2017-11-30 (×3): 2 [IU] via SUBCUTANEOUS
  Administered 2017-11-30: 3 [IU] via SUBCUTANEOUS
  Administered 2017-11-30: 2 [IU] via SUBCUTANEOUS
  Administered 2017-11-30: 3 [IU] via SUBCUTANEOUS
  Administered 2017-11-30: 2 [IU] via SUBCUTANEOUS
  Administered 2017-12-01: 3 [IU] via SUBCUTANEOUS
  Administered 2017-12-01: 2 [IU] via SUBCUTANEOUS
  Administered 2017-12-01: 3 [IU] via SUBCUTANEOUS
  Administered 2017-12-01: 2 [IU] via SUBCUTANEOUS
  Administered 2017-12-01 – 2017-12-02 (×3): 3 [IU] via SUBCUTANEOUS
  Administered 2017-12-02: 2 [IU] via SUBCUTANEOUS
  Administered 2017-12-02: 3 [IU] via SUBCUTANEOUS
  Administered 2017-12-02: 2 [IU] via SUBCUTANEOUS
  Administered 2017-12-03 (×2): 3 [IU] via SUBCUTANEOUS
  Administered 2017-12-03: 5 [IU] via SUBCUTANEOUS
  Administered 2017-12-03 (×3): 3 [IU] via SUBCUTANEOUS
  Administered 2017-12-04: 2 [IU] via SUBCUTANEOUS
  Administered 2017-12-04 (×2): 3 [IU] via SUBCUTANEOUS
  Administered 2017-12-04: 2 [IU] via SUBCUTANEOUS
  Administered 2017-12-04: 3 [IU] via SUBCUTANEOUS
  Administered 2017-12-04: 5 [IU] via SUBCUTANEOUS
  Administered 2017-12-05: 2 [IU] via SUBCUTANEOUS
  Administered 2017-12-05: 3 [IU] via SUBCUTANEOUS
  Administered 2017-12-05 (×3): 5 [IU] via SUBCUTANEOUS
  Administered 2017-12-06: 3 [IU] via SUBCUTANEOUS
  Administered 2017-12-06: 2 [IU] via SUBCUTANEOUS
  Administered 2017-12-06: 3 [IU] via SUBCUTANEOUS
  Administered 2017-12-07: 2 [IU] via SUBCUTANEOUS

## 2017-11-22 MED ORDER — PHENYLEPHRINE HCL 10 MG/ML IJ SOLN
INTRAVENOUS | Status: DC | PRN
  Administered 2017-11-22: 100 ug/min via INTRAVENOUS

## 2017-11-22 MED ORDER — MIDAZOLAM HCL 5 MG/5ML IJ SOLN
INTRAMUSCULAR | Status: DC | PRN
  Administered 2017-11-22 (×2): 1 mg via INTRAVENOUS

## 2017-11-22 MED ORDER — SODIUM CHLORIDE 0.9 % IV SOLN: 500.0000 mL | Freq: Once | INTRAVENOUS | Status: DC | PRN

## 2017-11-22 MED ORDER — LABETALOL HCL 5 MG/ML IV SOLN: 10.0000 mg | INTRAVENOUS | Status: DC | PRN

## 2017-11-22 MED ORDER — ONDANSETRON HCL 4 MG/2ML IJ SOLN: 4.0000 mg | Freq: Four times a day (QID) | INTRAMUSCULAR | Status: DC | PRN

## 2017-11-22 MED ORDER — ORAL CARE MOUTH RINSE
15.0000 mL | Freq: Four times a day (QID) | OROMUCOSAL | Status: DC
  Administered 2017-11-22: 15 mL via OROMUCOSAL

## 2017-11-22 MED ORDER — DEXMEDETOMIDINE HCL IN NACL 200 MCG/50ML IV SOLN
INTRAVENOUS | Status: AC
  Filled 2017-11-22: qty 50

## 2017-11-22 MED ORDER — PANTOPRAZOLE SODIUM 40 MG PO TBEC: 40.0000 mg | DELAYED_RELEASE_TABLET | Freq: Every day | ORAL | Status: DC

## 2017-11-22 MED ORDER — SODIUM CHLORIDE 0.9 % IV BOLUS
1000.0000 mL | Freq: Once | INTRAVENOUS | Status: AC
  Administered 2017-11-22: 1000 mL via INTRAVENOUS

## 2017-11-22 MED ORDER — PHENYLEPHRINE HCL 10 MG/ML IJ SOLN
INTRAVENOUS | Status: DC | PRN
  Administered 2017-11-22: 150 ug/min via INTRAVENOUS

## 2017-11-22 MED ORDER — ACETAMINOPHEN 325 MG PO TABS
325.0000 mg | ORAL_TABLET | ORAL | Status: DC | PRN
  Filled 2017-11-22 (×2): qty 2

## 2017-11-22 MED ORDER — ONDANSETRON HCL 4 MG/2ML IJ SOLN
4.0000 mg | Freq: Four times a day (QID) | INTRAMUSCULAR | Status: DC | PRN
  Administered 2017-11-22 – 2017-12-10 (×9): 4 mg via INTRAVENOUS
  Filled 2017-11-22 (×9): qty 2

## 2017-11-22 MED ORDER — ONDANSETRON HCL 4 MG/2ML IJ SOLN: 4.0000 mg | Freq: Once | INTRAMUSCULAR | Status: DC | PRN

## 2017-11-22 MED ORDER — FENTANYL CITRATE (PF) 100 MCG/2ML IJ SOLN: 25.0000 ug | INTRAMUSCULAR | Status: DC | PRN

## 2017-11-22 MED ORDER — VANCOMYCIN HCL IN DEXTROSE 1-5 GM/200ML-% IV SOLN
1000.0000 mg | Freq: Two times a day (BID) | INTRAVENOUS | Status: AC
  Administered 2017-11-23 (×2): 1000 mg via INTRAVENOUS
  Filled 2017-11-22 (×2): qty 200

## 2017-11-22 MED ORDER — ROCURONIUM BROMIDE 100 MG/10ML IV SOLN
INTRAVENOUS | Status: DC | PRN
  Administered 2017-11-22: 50 mg via INTRAVENOUS

## 2017-11-22 MED FILL — Morphine Sulfate Inj 4 MG/ML: INTRAMUSCULAR | Qty: 0.5 | Status: AC

## 2017-11-22 MED FILL — Heparin Sod (Porcine)-NaCl IV Soln 1000 Unit/500ML-0.9%: INTRAVENOUS | Qty: 1000 | Status: AC

## 2017-11-22 SURGICAL SUPPLY — 50 items
ADH SKN CLS APL DERMABOND .7 (GAUZE/BANDAGES/DRESSINGS) ×1
BANDAGE ESMARK 6X9 LF (GAUZE/BANDAGES/DRESSINGS) IMPLANT
BNDG CMPR 9X6 STRL LF SNTH (GAUZE/BANDAGES/DRESSINGS)
BNDG ESMARK 6X9 LF (GAUZE/BANDAGES/DRESSINGS)
CANISTER SUCT 3000ML PPV (MISCELLANEOUS) ×3 IMPLANT
CANNULA VESSEL 3MM 2 BLNT TIP (CANNULA) ×3 IMPLANT
CLIP LIGATING EXTRA MED SLVR (CLIP) ×3 IMPLANT
CLIP LIGATING EXTRA SM BLUE (MISCELLANEOUS) ×3 IMPLANT
COVER SURGICAL LIGHT HANDLE (MISCELLANEOUS) ×2 IMPLANT
CUFF TOURNIQUET SINGLE 34IN LL (TOURNIQUET CUFF) IMPLANT
CUFF TOURNIQUET SINGLE 44IN (TOURNIQUET CUFF) IMPLANT
DERMABOND ADVANCED (GAUZE/BANDAGES/DRESSINGS) ×2
DERMABOND ADVANCED .7 DNX12 (GAUZE/BANDAGES/DRESSINGS) ×1 IMPLANT
DRAIN SNY 10X20 3/4 PERF (WOUND CARE) IMPLANT
DRAPE X-RAY CASS 24X20 (DRAPES) IMPLANT
ELECT REM PT RETURN 9FT ADLT (ELECTROSURGICAL) ×3
ELECTRODE REM PT RTRN 9FT ADLT (ELECTROSURGICAL) ×1 IMPLANT
EVACUATOR SILICONE 100CC (DRAIN) ×2 IMPLANT
GAUZE SPONGE 4X4 12PLY STRL (GAUZE/BANDAGES/DRESSINGS) ×3 IMPLANT
GAUZE SPONGE 4X4 12PLY STRL LF (GAUZE/BANDAGES/DRESSINGS) ×2 IMPLANT
GLOVE BIO SURGEON STRL SZ 6.5 (GLOVE) ×6 IMPLANT
GLOVE BIO SURGEONS STRL SZ 6.5 (GLOVE) ×6
GLOVE SS BIOGEL STRL SZ 7.5 (GLOVE) ×1 IMPLANT
GLOVE SUPERSENSE BIOGEL SZ 7.5 (GLOVE) ×2
GOWN STRL REUS W/ TWL LRG LVL3 (GOWN DISPOSABLE) ×3 IMPLANT
GOWN STRL REUS W/TWL LRG LVL3 (GOWN DISPOSABLE) ×9
INSERT FOGARTY SM (MISCELLANEOUS) IMPLANT
KIT BASIN OR (CUSTOM PROCEDURE TRAY) ×3 IMPLANT
KIT TURNOVER KIT B (KITS) ×3 IMPLANT
NS IRRIG 1000ML POUR BTL (IV SOLUTION) ×6 IMPLANT
PACK PERIPHERAL VASCULAR (CUSTOM PROCEDURE TRAY) ×3 IMPLANT
PAD ARMBOARD 7.5X6 YLW CONV (MISCELLANEOUS) ×6 IMPLANT
PADDING CAST COTTON 6X4 STRL (CAST SUPPLIES) IMPLANT
SET COLLECT BLD 21X3/4 12 (NEEDLE) IMPLANT
SPONGE LAP 18X18 X RAY DECT (DISPOSABLE) ×2 IMPLANT
STAPLER VISISTAT 35W (STAPLE) IMPLANT
STOPCOCK 4 WAY LG BORE MALE ST (IV SETS) IMPLANT
SUT ETHILON 3 0 PS 1 (SUTURE) IMPLANT
SUT PROLENE 5 0 C 1 24 (SUTURE) ×11 IMPLANT
SUT PROLENE 6 0 CC (SUTURE) ×3 IMPLANT
SUT SILK 2 0 SH (SUTURE) ×3 IMPLANT
SUT VIC AB 2-0 CTX 36 (SUTURE) ×6 IMPLANT
SUT VIC AB 3-0 SH 27 (SUTURE) ×6
SUT VIC AB 3-0 SH 27X BRD (SUTURE) ×2 IMPLANT
TAPE CLOTH SURG 4X10 WHT LF (GAUZE/BANDAGES/DRESSINGS) ×2 IMPLANT
TOWEL GREEN STERILE (TOWEL DISPOSABLE) ×3 IMPLANT
TRAY FOLEY MTR SLVR 16FR STAT (SET/KITS/TRAYS/PACK) ×3 IMPLANT
TUBING EXTENTION W/L.L. (IV SETS) IMPLANT
UNDERPAD 30X30 (UNDERPADS AND DIAPERS) ×3 IMPLANT
WATER STERILE IRR 1000ML POUR (IV SOLUTION) ×3 IMPLANT

## 2017-11-22 NOTE — Transfer of Care (Signed)
Immediate Anesthesia Transfer of Care Note  Patient: [name]  Procedure(s) Performed: RIGHT GROIN EXPLORATION FOR BLEEDING, Repair Puncture in Right Femoral Graft. (Right Groin)  Patient Location: ICU  Anesthesia Type:General  Level of Consciousness: sedated and unresponsive  Airway & Oxygen Therapy: Patient remains intubated per anesthesia plan and Patient placed on Ventilator (see vital sign flow sheet for setting)  Post-op Assessment: Report given to RN and Post -op Vital signs reviewed and unstable, Anesthesiologist notified  Post vital signs: Reviewed and unstable  Last Vitals:  Vitals Value Taken Time  BP 68/59 11/04/2017  2:51 PM  Temp    Pulse 91 11/19/2017  2:53 PM  Resp 17 11/23/2017  2:53 PM  SpO2 100 % 11/14/2017  2:53 PM  Vitals shown include unvalidated device data.  Last Pain:  Vitals:   11/14/2017 0215  TempSrc:   PainSc: 0-No pain         Complications: No apparent anesthesia complications

## 2017-11-22 NOTE — Op Note (Signed)
    OPERATIVE REPORT  DATE OF SURGERY: 11/05/2017  PATIENT: [name], 72 y.o. female MRN: [id number]  DOB: 10-23-1945  PRE-OPERATIVE DIAGNOSIS: Bleeding from right groin puncture site with hypotension and large hematoma  POST-OPERATIVE DIAGNOSIS:  Same  PROCEDURE: Evacuation and exploration of right groin with closure of puncture of right femoral graft  SURGEON:  Gretta Began, M.D.  PHYSICIAN ASSISTANT: Darlin Coco, PA-C  ANESTHESIA: General  EBL: Thousand hematoma evacuation ml  Total I/O In: 1720 [P.O.:380; Blood:1340] Out: 1200 [Urine:200; Blood:1000]  BLOOD ADMINISTERED: 2 units packed cells  DRAINS: 19 French Blake drain  SPECIMEN: None  COUNTS CORRECT:  YES  PLAN OF CARE: ICU critical  PATIENT DISPOSITION:  PACU - hemodynamically stable  PROCEDURE DETAILS: Patient underwent arteriography and intervention 1 day prior.  On the morning of this procedure the patient was doing quite well with no evidence of significant hematoma.  She suddenly began having pain in her right groin and was hypotensive.  She had a very large development of hematoma in her right groin and her anterior abdominal wall.  She had arrest and was intubated on the floor and taken immediately to the operating room for resuscitation and repair.  The right groin was prepped and draped in usual sterile fashion.  An incision was made over the femoral graft and carried down through the subcutaneous tissue with electrocautery.  The large hematoma space was entered and there was a great deal of fresh clot in this large hematoma space.  There was minimal bleeding from the puncture site.  On further exploration there was a single puncture on the anterior wall of the prior placed aortofemoral graft as expected.  There was brisk bleeding from this.  This was controlled with a 5-0 Prolene figure-of-eight suture.  The large hematoma space was irrigated with saline and clot was removed.  A separate stab incision  was made at the upper end of the hematoma space and a 19 [place] Blake drain was placed through this.  The drain was placed in the base of the wound and was secured to the skin with a 3-0 nylon stitch.  The wound was closed with several layers of 2-0 Vicryl in the subcutaneous tissue.  Skin was closed with 3 oh sub-particular Vicryl stitch.  The dressing was applied and the patient was transferred to the intensive care unit in critical condition   Larina Earthly, M.D., [facility] 11/26/2017 2:52 PM

## 2017-11-22 NOTE — Progress Notes (Signed)
1100 am, Discharge instructions completed with patient and husband at bedside.1125am, called back  in to see patient after dressing up for discharge  with c/o pain . Noted patient diaphoretic, c/o pain on rt groin . Large hematoma noted on rt femoral extending to anterior rt lower abdomen . Pressure hold maintained on site, Dr Myra Gianotti /Early team notified. Patient placed back on continous caridac monitor,HR 110 ST ,diaphoretic and pale, IV bolus given initial b/p 38/26 mmhg ,  o2 2l applied,IV team started new IV line ,IV bolus given , SWOT/ Rapid response team at bedside,patient remain awake and alert. Lianne Cure PA , Dr  Early seen patient, plan explained to patient and husband for Stat OR  Procedure. Code blue called due to patient decreasing level of consciousness and vital signs.Code team in ,stat intubation done at bedside ,transported patient via bed to OR .

## 2017-11-22 NOTE — Anesthesia Procedure Notes (Addendum)
Date/Time: 11/18/2017 12:50 PM Performed by: Mariea Clonts, CRNA Pre-anesthesia Checklist: Patient identified, Emergency Drugs available, Suction available, Patient being monitored and Timeout performed Patient Re-evaluated:Patient Re-evaluated prior to induction Oxygen Delivery Method: Circle system utilized Preoxygenation: Pre-oxygenation with 100% oxygen Induction Type: Inhalational induction Laryngoscope Size: Mac and 3 Grade View: Grade III Tube type: Oral Tube size: 7.5 mm Placement Confirmation: ETT inserted through vocal cords under direct vision,  CO2 detector and breath sounds checked- equal and bilateral Dental Injury: Teeth and Oropharynx as per pre-operative assessment

## 2017-11-22 NOTE — Discharge Instructions (Signed)
° °  [facility] [place] ° °Discharge Instructions ° °Lower Extremity Angiogram; Angioplasty/Stenting ° °Please refer to the following instructions for your post-procedure care. Your surgeon or physician assistant will discuss any changes with you. ° °Activity ° °Avoid lifting more than 8 pounds (1 gallons of milk) for 72 hours (3 days) after your procedure. You may walk as much as you can tolerate. It's OK to drive after 72 hours. ° °Bathing/Showering ° °You may shower the day after your procedure. If you have a bandage, you may remove it at 24- 48 hours. Clean your incision site with mild soap and water. Pat the area dry with a clean towel. ° °Diet ° °Resume your pre-procedure diet. There are no special food restrictions following this procedure. All patients with peripheral vascular disease should follow a low fat/low cholesterol diet. In order to heal from your surgery, it is CRITICAL to get adequate nutrition. Your body requires vitamins, minerals, and protein. Vegetables are the best source of vitamins and minerals. Vegetables also provide the perfect balance of protein. Processed food has little nutritional value, so try to avoid this. ° °Medications ° °Resume taking all of your medications unless your doctor tells you not to. If your incision is causing pain, you may take over-the-counter pain relievers such as acetaminophen (Tylenol) ° °Follow Up ° °Follow up will be arranged at the time of your procedure. You may have an office visit scheduled or may be scheduled for surgery. Ask your surgeon if you have any questions. ° °Please call us immediately for any of the following conditions: °•Severe or worsening pain your legs or feet at rest or with walking. °•Increased pain, redness, drainage at your groin puncture site. °•Fever of 101 degrees or higher. °•If you have any mild or slow bleeding from your puncture site: lie down, apply firm constant pressure over the area with a piece of  gauze or a clean wash cloth for 30 minutes- no peeking!, call 911 right away if you are still bleeding after 30 minutes, or if the bleeding is heavy and unmanageable. ° °Reduce your risk factors of vascular disease: ° °Stop smoking. If you would like help call [facility] at 1-800-QUIT-NOW (1-800-784-8669) or [place] at 336-586-4000. °Manage your cholesterol °Maintain a desired weight °Control your diabetes °Keep your blood pressure down ° °If you have any questions, please call the office at [phone] ° °

## 2017-11-22 NOTE — Progress Notes (Signed)
MD aware of all out of range BPs on pts over this past afternoon.  See other notes and nursing care order for more details.

## 2017-11-22 NOTE — Code Documentation (Signed)
  Patient Name: [name]   MRN: [id number]   Date of Birth/ Sex: 03/26/1946 , female      Admission Date: 2017-12-02  Attending Provider: Nada Libman, MD  Primary Diagnosis: <principal problem not specified>   Indication: Pt was in her usual state of health until this AM, when she was getting dressed to be discharged home and developed recurrent pseudoaneurysm in the right groin area.  Per nursing staff she became diaphoretic and hypotensive. Hematoma expanded into the abdominal area and surrounding groin. Patient was given IVF. Dr. Arbie Cookey at bedside upon arrival. Code blue subsequently called to expedite transport to OR.    Technical Description:  - CPR performance duration:  Not performed   - Was defibrillation or cardioversion used? No   - Was external pacer placed? No  - Was patient intubated? Yes    Medications Administered: Y = Yes; Blank = No Amiodarone    Atropine    Calcium    Epinephrine    Lidocaine    Magnesium    Norepinephrine    Phenylephrine    Sodium bicarbonate    Vasopressin     Post CPR evaluation:  - Final Status - Was patient successfully resuscitated ? N/A - What is current rhythm? NSR - What is current hemodynamic status? Unstable  Miscellaneous Information:  - Labs sent, including: No  - Primary team notified?  Yes  - Family Notified? Yes  - Additional notes/ transfer status:  Patient was emergently transported to OR.      Toney Rakes, MD  11/09/2017, 12:51 PM

## 2017-11-22 NOTE — Progress Notes (Signed)
Patient ID: [name], female   DOB: [birth year], 72 y.o.   MRN: [id number] Awake on vent.  Asking to have tube removed.  Difficult to know her actual blood pressure.  Severe upper extremity arterial occlusive disease in addition to lower extremity issues.  Bounding left femoral pulse.  Will wean levo fed.  Discussed with pulmonary critical care.  Plan to have a quick weaning trial and extubation if possible.  No evidence of further bleeding

## 2017-11-22 NOTE — Anesthesia Postprocedure Evaluation (Signed)
Anesthesia Post Note  Patient: [name]  Procedure(s) Performed: RIGHT GROIN EXPLORATION FOR BLEEDING, Repair Puncture in Right Femoral Graft. (Right Groin)     Patient location during evaluation: SICU Anesthesia Type: General Level of consciousness: awake Pain management: pain level controlled Respiratory status: patient remains intubated per anesthesia plan and patient on ventilator - see flowsheet for VS Anesthetic complications: no Comments: Patient awake on vent. Unable to assess BP. Appears neurologically intact. Strong L. Femoral pulse, CXR shows ETT in good position no airspace disease. should be OK for extubation soon.    Last Vitals:  Vitals:   29-Nov-2017 2000 11/29/17 2030  BP: 95/81 112/75  Pulse: (!) 112 (!) 117  Resp:  (!) 29  Temp:    SpO2: 100% 100%    Last Pain:  Vitals:   11/29/2017 0215  TempSrc:   PainSc: 0-No pain                 Lakyia Behe COKER

## 2017-11-22 NOTE — Progress Notes (Addendum)
Upon receiving pt from OR, nearly impossible to obtain BP via cuff.  CCM and vascular at bedside and aware of findings.  Pt on neo gtt and ordered to d/c and use Levophed gtt.  Vascular surgeon ordered to not go off of BP cuff reading d/t inaccuracy, but instead, as long as she has a bounding L femoral pulse, making urine & mentating well, then to assume she is stable.  Pt became very anxious and agitated on the vent.  Unable to give PRN sedation d/t not knowing a real blood pressure.  Pt's husband at bedside demanding she be extubated.  Dr. Arbie Cookey notified and stated he will come to bedside shortly.  CCM and Dr. Arbie Cookey came to bedside and agreed pt was ok to be extubated and was extubated to 4L.  Dr. Arbie Cookey verbally confirmed to wean down Levophed gtt and to just keep checking on L femoral pressures, urine output & mentation.   Will continue to monitor closely and update as needed.  Full detailed report given to night RN and informed of all verbal orders per MD. Emotional support given to pt & husband.

## 2017-11-22 NOTE — Significant Event (Addendum)
Rapid Response Event Note  Overview: Time Called: 1133 Arrival Time: 1140 Event type: Hypotension and post cath bleeding  Initial Focused Assessment: Received call from bedside RN that patient was dressed and discharged, IV's removed, but then "started declining". Upon entering room, patient pale, perfusely diaphoretic and profoundly hypotensive. Bedside RN holding pressure to right groin, states patient has large hematoma. Patient alert and oriented, husband at bedside. IV team and vascular PA already called.  Interventions: IV placed by IV team with ultrasound  Labs drawn for CBC and T&S  Normal Saline liter bolus via pressure bag  Continue to hold pressure on right groin.  Dr Early notified to come by Clinton Gallant, PA.  Dr Early assessed patient and notified OR to prepare for surgery.  While getting ready to transport patient to OR, patient began to have LOC changes, became more lethargic and difficult to arouse. Patient appears more pale than when first assessed.  Breathing became agonal.  *Called Code Blue d/t agonal breathing, need to protect airway to get to OR.  Second liter of NS hung. Called blood bank to prepare 2 units of uncrossmatched blood for emergency transfusion.   Please refer to CODE BLUE record.  Plan of Care (if not transferred):  Event Summary: Name of Physician Notified: Clinton Gallant, PA at 1146  Name of Consulting Physician Notified: Dr. Arbie Cookey at 1200  Outcome: Patient transported to OR hemodynamically unstable.  Event End Time: 1251  [name] E Finnegan Gatta

## 2017-11-22 NOTE — Procedures (Signed)
Extubation Procedure Note  Patient Details:   Name: [name] DOB: December 26, 1945 MRN: [id number]   Airway Documentation:   Found pt already on wean = done by CCM.  + cuff leak test prior to extubation.  Vent end date: 11/07/2017 Vent end time: 1855   Evaluation  O2 sats: stable throughout Complications: No apparent complications Patient did tolerate procedure well. Bilateral Breath Sounds: Rhonchi   Yes pt able to speak.  No distress noted.  Sat 100% on 4 lpm [place]  Jennette Kettle 11/23/2017, 6:56 PM

## 2017-11-22 NOTE — Progress Notes (Signed)
Pt refused lab (CBC, BMP) today.

## 2017-11-22 NOTE — Progress Notes (Addendum)
[facility]   Subjective  - feeling better.   Objective 93/62 85 97.8 F (36.6 C) (Oral) 15 100%  Intake/Output Summary (Last 24 hours) at 11/11/2017 0829 Last data filed at 11/05/2017 0825 Gross per 24 hour  Intake 1526.08 ml  Output 200 ml  Net 1326.08 ml    Doppler signal peroneal and dp on the left Placed dry guaze between left toes to prevent further skin break down. Right groin hematoma, soft without expansion Heart RRR Lungs non labored breathing  Assessment/Planning: POD # 1  Procedure Performed:             1.  Ultrasound-guided access, right femoral artery             2.  Ultrasound-guided access, left femoral artery             3.  Abdominal aortogram             4.  Left lower extremity runoff             5.  Stent, left superficial femoral artery             6.  Balloon angioplasty, left peroneal and tibioperoneal trunk             7.  Conscious sedation (118 minutes)   Impression:             #1  Occluded left superficial femoral and profunda femoral artery.             #2  Patent right femoral above-knee popliteal bypass graft with diffuse long segment stenosis within the outflow above the popliteal artery.  This did not respond to balloon angioplasty and was ultimately treated with drug-coated stenting using a 6 mm stent.             #3  Occlusion of all 3 tibial vessels.  I was able to recanalize the peroneal artery and performed balloon angioplasty using a 3 mm balloon.   F/U referral to out patient wound center F/U with Dr. Myra Gianotti and studies have been arranged.  Discharge on Plavix and aspirin for 1 month.  [name] 11/28/2017 8:29 AM --  Laboratory Lab Results: Recent Labs    December 15, 2017 0922  HGB 11.2*  HCT 33.0*   BMET Recent Labs    12/15/2017 0922  NA 139  K 3.6  CL 99*  GLUCOSE 67  BUN 12  CREATININE 0.90    COAG Lab Results  Component Value Date   INR 1.60 (H) 12/11/2015   INR  1.07 08/19/2015   INR 1.01 06/05/2015   No results found for: PTT  Right groin is tender with hematoma.  No active bleeding.  Left groin is soft.  Plan for d/c this am.  [name]

## 2017-11-22 NOTE — Progress Notes (Signed)
[facility]  Discussed with Dr. Arbie Cookey Mental status OK Passing SBT We will never really be able to get a good blood pressure Will extubate  Heber Gaston, MD [facility] Pager: [phone] Cell: [phone] After 3pm or if no response, call [phone]

## 2017-11-22 NOTE — Progress Notes (Signed)
Per CCM, unable to obtain A-line at this time.  Per report, a-line attempts have been unsuccessful in OR prior to coming to unit.  Pt w/ cold extremities.  Per CCM, obtain VBG instead of ABG.  RN aware.

## 2017-11-22 NOTE — Anesthesia Procedure Notes (Addendum)
Central Venous Catheter Insertion Performed by: Kipp Brood, MD, anesthesiologist Start/End06-02-19 1:10 PM, 2017/12/03 1:15 PM Patient location: OR. Preanesthetic checklist: patient identified, IV checked, site marked, risks and benefits discussed, surgical consent, monitors and equipment checked, pre-op evaluation, timeout performed and anesthesia consent Lidocaine 1% used for infiltration and patient sedated Hand hygiene performed  and maximum sterile barriers used  Catheter size: 8 Fr Total catheter length 16. Central line was placed.Double lumen Procedure performed using ultrasound guided technique. Ultrasound Notes:image(s) printed for medical record Attempts: 1 Following insertion, dressing applied and line sutured. Post procedure assessment: blood return through all ports  Patient tolerated the procedure well with no immediate complications.

## 2017-11-22 NOTE — Progress Notes (Signed)
   11/17/2017 1300  Clinical Encounter Type  Visited With Patient;Patient and family together  Visit Type Initial  Referral From Nurse  Consult/Referral To Chaplain  Spiritual Encounters  Spiritual Needs Emotional  Stress Factors  Patient Stress Factors Exhausted    This was a page for a 72 yr old female who coded. Large group of medical staff on-site. Pt's husband on-site and tearful. Pt  Was immediately rushed to the OR while I escorted the husband to the short stay waiting place. Chaplain provided compassionate presence.  Crystal Ellwood a Water quality scientist, [employer]

## 2017-11-22 NOTE — Progress Notes (Signed)
    Called emergently to the patient room.  She was getting dressed to be discharged home when she developed recurrent pseudoaneurysm in the right groin area.  Per nursing staff she became diaphoretic and hypotensive.    Pressure is being held manually over the right groin.  Hematoma has expanded into the abdominal area and surrounding groin.  Dr. Arbie Cookey came to bedside and it was decided we will go to the OR for exploration of the right groin.   Type and screen and cross ordered 1L bolus saline given and fluids at 75cc/hr once bolus completed for hypotension Pressures came up. Hyperglycemia sliding scale ordered Zofran given.   [name] PA-C

## 2017-11-22 NOTE — Consult Note (Signed)
PULMONARY / CRITICAL CARE MEDICINE   Name: [name] MRN: [id number] DOB: 04/05/1946    ADMISSION DATE:  11/02/2017 CONSULTATION DATE:  5/22  REFERRING MD:  Early   CHIEF COMPLAINT:  S/p cardiac arrest: ventilatory and critical care management   HISTORY OF PRESENT ILLNESS:   This is a 72 year old female patient with a history of occluded left superficial femoral and profundofemoral artery.  She initially did not respond to balloon angioplasty and ultimately required drug-coated stenting using a 6 mm stent.  The procedure was pleaded on 5/21 and she was to be discharged on 5/22 with follow-up with vascular surgery and to be maintained on Plavix and aspirin for 1 month.  Currently the patient was getting dressed around noontime on 5/22 when she developed a recurrent pseudoaneurysm involving the right groin area.  She acutely became diaphoretic and hypotensive.  Vascular surgery was called emergently to the bedside, pressure was held fluid volume resuscitation was initiated and rapid response called.  This rapidly progressed to agonal respiratory efforts Requiring intubation for loss of consciousness.  Her initial hemoglobin was 6.5, this had dropped from 8.3.  She was emergently transported to the OR where she underwent emergent evacuation and exploration of the right groin, and closure of puncture right femoral graft.  Review of anesthesia notes show: She received a total of 1300 mL of blood, 2000 mL of lactated Ringer's, intermittent phenylephrine infusion.  Her EBL was estimated at 1 L., she returned to the intensive care on the mechanical ventilator,   PAST MEDICAL HISTORY :  She  has a past medical history of Anemia, Anxiety, Carotid artery occlusion, CHF (congestive heart failure) (HCC), Constipation, Coronary artery disease, Diabetic neuropathy with neurologic complication (HCC), Fall from slipping on wet surface (Jan. 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  PAST SURGICAL HISTORY: She  has a past surgical history that includes Vesicovaginal fistula closure w/ TAH; Colonoscopy (11/2010); Upper gastrointestinal endoscopy (11/2010); Femoral artery - popliteal artery bypass graft; Abdominal hysterectomy; Femoral-popliteal Bypass Graft (08/22/2011); Intraoperative arteriogram (08/22/2011); Multiple tooth extractions (08-29-2011); pci (01/17/12); Breast lumpectomy; Tonsillectomy and adenoidectomy; Coronary angioplasty with stent; Carotid endarterectomy (~ 2005); Aorta - bilateral femoral artery bypass graft (03/26/2012); Umbilical hernia repair (03/26/2012); Femoral artery debridement (03/26/2012); Amputation (04/23/2012); Eye surgery (Right, Feb. 9, 2015); Eye surgery (Left, Feb. 16, 2015); abdominal aortagram (N/A, 05/17/2011); lower extremity angiogram (05/17/2011); percutaneous stent intervention (Right, 05/17/2011); lower extremity angiogram (N/A, 09/21/2011); abdominal angiogram (09/21/2011); abdominal aortagram (N/A, 01/17/2012); left heart catheterization with coronary angiogram (N/A, 04/05/2012); Cardiac catheterization (N/A, 06/08/2015); Hernia repair; Femoral-tibial Bypass Graft (Left, 08/19/2015); PERIPHERAL VASCULAR INTERVENTION (11/10/2017); Lower Extremity Angiography (N/A, 11/22/2017); PERIPHERAL VASCULAR BALLOON ANGIOPLASTY (Left, 11/24/2017); and PERIPHERAL VASCULAR INTERVENTION (Left, 11/28/2017).  Allergies  Allergen Reactions  . Acetaminophen Nausea  Only and Other (See Comments)    Does not tolerate well, nausea  . Ceftriaxone Itching    Pt has tolerated amoxicillin and cephalexin in the past.  . Codeine Nausea And Vomiting  . Erythromycin Nausea And Vomiting  . Hydrocodone-Acetaminophen Nausea And Vomiting  . Hydromorphone Nausea And Vomiting  . Propoxyphene Hcl Nausea  And Vomiting  . Statins Other (See Comments)    Leg myalgias  . Eggs Or Egg-Derived Products Other (See Comments)    "pt does not eat" Unspecified reaction  . Ibuprofen Other (See Comments)    Unspecified  Per Pt "Does not tolerate well"  . Shellfish-Derived Products Other (See Comments)    "pt does not eat" Unspecified reaction    No current facility-administered medications on file prior to encounter.    Current Outpatient Medications on File Prior to Encounter  Medication Sig  . ALPRAZolam (XANAX) 0.25 MG tablet Take 0.25 mg by mouth daily as needed for anxiety.   Marland Kitchen aspirin EC 81 MG tablet Take 1 tablet (81 mg total) by mouth daily. (Patient taking differently: Take 162 mg by mouth daily. Takes two daily)  . calcium carbonate (TUMS - DOSED IN MG ELEMENTAL CALCIUM) 500 MG chewable tablet Chew 1,000 mg by mouth daily as needed for indigestion or heartburn.  . dexlansoprazole (DEXILANT) 60 MG capsule Take 60 mg by mouth 2 (two) times daily.  . ergocalciferol (VITAMIN D2) 50000 units capsule Take 50,000 Units by mouth 3 (three) times a week.  . furosemide (LASIX) 40 MG tablet Take 40 mg by mouth 2 (two) times daily.  . iron polysaccharides (NIFEREX) 150 MG capsule Take 600 mg by mouth daily.   . isosorbide mononitrate (IMDUR) 30 MG 24 hr tablet Take 30 mg by mouth daily.  . Lactobacillus (ACIDOPHILUS PO) Take 1 capsule by mouth daily.  . meperidine (DEMEROL) 50 MG tablet Take 50 mg by mouth every 4 (four) hours as needed for severe pain.  . metFORMIN (GLUCOPHAGE) 500 MG tablet Take 500 mg by mouth 2 (two) times daily with a meal.   . metoprolol succinate  (TOPROL-XL) 50 MG 24 hr tablet Take 1 tablet (50 mg total) by mouth daily.  Marland Kitchen omega-3 acid ethyl esters (LOVAZA) 1 G capsule Take 2 g by mouth 2 (two) times daily.   . prochlorperazine (COMPAZINE) 10 MG tablet Take 10 mg by mouth every 6 (six) hours as needed for nausea or vomiting.  . ramipril (ALTACE) 1.25 MG capsule Take 1.25 mg by mouth daily.  . rosuvastatin (CRESTOR) 10 MG tablet Take 10 mg by mouth every Monday.   . sodium chloride (OCEAN) 0.65 % SOLN nasal spray Place 2-4 sprays into both nostrils every 4 (four) hours as needed (for nose bleeds).  Marland Kitchen sulfamethoxazole-trimethoprim (BACTRIM DS,SEPTRA DS) 800-160 MG tablet Take 1 tablet by mouth 2 (two) times daily.  . TRESIBA FLEXTOUCH 200 UNIT/ML SOPN Inject 16 Units into the muscle every morning.   . nitroGLYCERIN (NITROSTAT) 0.4 MG SL tablet Place 1 tablet (0.4 mg total) under the tongue every 5 (five) minutes as needed. For chest pain    FAMILY HISTORY:  Her indicated that her mother is deceased. She indicated that her father is deceased. She indicated that both of her brothers are alive. She indicated that her maternal grandmother is deceased. She indicated that her maternal grandfather is deceased. She indicated that her paternal grandmother is deceased. She indicated that her paternal grandfather is deceased. She indicated that her daughter is alive. She indicated that her son is alive.   SOCIAL HISTORY: She  reports that she quit smoking about 16 years ago. Her smoking use included cigarettes. She has a 78.00 pack-year smoking history. She has never used smokeless tobacco. She reports that she drinks about 9.0 oz of alcohol per week. She reports that she does not use drugs.  REVIEW OF SYSTEMS:   Unable   SUBJECTIVE:  Critically ill on 200 mcg neo Mottled and sedated   VITAL SIGNS: Blood Pressure (Abnormal) 207/67   Pulse 96   Temperature 97.8 F (36.6 C) (Oral)   Respiration 16   Height 5\' 4"  (1.626 m)   Weight 160 lb 0.9  oz (72.6 kg)   Oxygen Saturation 100%   Body Mass Index 27.47 kg/m    HEMODYNAMICS:    VENTILATOR SETTINGS: Vent Mode: PRVC FiO2 (%):  [100 %] 100 % Set Rate:  [16 bmp] 16 bmp Vt Set:  [500 mL] 500 mL PEEP:  [5 cmH20] 5 cmH20 Plateau Pressure:  [20 cmH20] 20 cmH20  INTAKE / OUTPUT:  Intake/Output Summary (Last 24 hours) at 11/25/2017 1530 Last data filed at 11/07/2017 1500 Gross per 24 hour  Intake 5023.58 ml  Output 1400 ml  Net 3623.58 ml     PHYSICAL EXAMINATION: General: Critically ill 72 year old female currently sedated on the ventilator status post incision and drainage of right groin hematoma Neuro: Sedated on Precedex.  The right pupil is fixed and nonreactive the left pupil is briskly reactive.  She does low commands now that the Precedex is off.  Generalized weakness. HEENT: Normocephalic atraumatic.  She has a left internal jugular vein catheter which is continuously oozing and pressures being applied Cardiovascular: Regular rate and rhythm Lungs: Clear to auscultation Abdomen: Hypoactive.  The right lower quadrant has a large hematoma/area of ecchymosis.  There is a JP drain with bloody drainage Musculoskeletal: Right BKA.  Right groin ecchymosis.  Left leg is cool and mottled Skin: Skin diffusely mottled and cool  LABS:  BMET Recent Labs  Lab December 13, 2017 0922 12/01/2017 0807  NA 139 139  K 3.6 3.8  CL 99* 107  CO2  --  25  BUN 12 9  CREATININE 0.90 0.94  GLUCOSE 67 202*    Electrolytes Recent Labs  Lab 11/27/2017 0807  CALCIUM 7.6*    CBC Recent Labs  Lab 12/09/2017 0922 11/05/2017 0807 11/21/2017 1200  WBC  --  8.0 7.3  HGB 11.2* 8.3* 6.5*  HCT 33.0* 26.4* 20.6*  PLT  --  320 365    Coag's No results for input(s): APTT, INR in the last 168 hours.  Sepsis Markers No results for input(s): LATICACIDVEN, PROCALCITON, O2SATVEN in the last 168 hours.  ABG No results for input(s): PHART, PCO2ART, PO2ART in the last 168 hours.  Liver  Enzymes No results for input(s): AST, ALT, ALKPHOS, BILITOT, ALBUMIN in the last 168 hours.  Cardiac Enzymes No results for input(s): TROPONINI, PROBNP in the last 168 hours.  Glucose Recent Labs  Lab 12/11/2017 1744 13-Dec-2017 1801 December 13, 2017 1843 12/12/2017 2120 11/12/2017 0620 11/02/2017 1204  GLUCAP 65 70 89 143* 179* 278*    Imaging No results found.   STUDIES:    CULTURES:   ANTIBIOTICS:   SIGNIFICANT EVENTS:   LINES/TUBES: Left IJ triple-lumen catheter placed 5/22 Oral endotracheal tube placed 5/22   DISCUSSION: 72 year old female with a history of peripheral vascular disease, and diabetes.  Presented for occluded left superficial femoral artery, underwent stenting however prior to discharge developed acute groin hematoma, profound hypotensive, and near cardiac arrest requiring emergent intubation and exploratory surgery where she underwent evacuation of hematoma and was found to have a unsure on the femoral sheath which was repaired.  Intraoperatively she received 4 units of blood, she returns to the intensive care hypotensive, mottled, and on full ventilator support.  She is critically  ill.  Stat CBC has been sent, postoperative labs are pending.  She will likely need more blood.  ASSESSMENT / PLAN:   Hemorrhagic shock secondary to puncture of right femoral graft with resultant right groin hematoma in patient with severe peripheral vascular disease Now status post repair by vascular surgery Received total of 4 units of blood, pretransfusion hemoglobin 6.5 Currently on 200 mcg of phenylephrine Plan Stat CBC with serial CBC checks, transfuse for symptomatic anemia, keeping hemoglobin greater than 7 minimally Transition Neo-Synephrine to norepinephrine map goal greater than 65 Check arterial blood gas Hold antihypertensives Continue telemetry monitoring Holding aspirin and Plavix for now  Acute respiratory failure in the setting of near cardiopulmonary  arrest Plan Chest x-ray Follow-up ABG Continue full ventilator support PAD protocol RASS goal 0  Acute encephalopathy.  Seems to be waking up some after Precedex discontinued.  Unclear if this is residual anesthesia, or secondary to hypoperfusion.  She is at risk for anoxic injury however downtime seems minimal, and she did not require CPR Plan Continue supportive care PAD protocol RASS goal 0 to -1  History of coronary artery disease -Last EF estimated 55 to 60% back in 2017 Plan To new telemetry monitoring At risk for fluid and electrolyte imbalance as well as acute kidney injury Plan Maintain mean arterial pressure greater than 65 Strict intake output Follow-up chemistry Renal dose medications  Diabetes with hyperglycemia Plan Sliding scale insulin per protocol   FAMILY  - Updates:   - Inter-disciplinary family meet or Palliative Care meeting due by: 5/29  Simonne Martinet ACNP-BC [facility] Pulmonary/Critical Care Pager # [phone] OR # [phone] if no answer   11/12/2017, 2:57 PM

## 2017-11-22 NOTE — Progress Notes (Signed)
  Day of Surgery Note    Subjective:  Intubated and sedated.   Vitals:   11/28/2017 1235 11/16/2017 1441  BP: (!) 207/67   Pulse:  96  Resp: (!) 35 16  Temp:    SpO2: (!) 80% 100%    Incisions:   Right groin is clean; there is 75cc bloody drainage out of drain; ecchymosis right groin. Extremities:  Toes left foot purple/ischemic; + monophasic doppler signal present; easily palpable left femoral pulse.   Cardiac:  regular Lungs:  Intubated 100% FiO2   Assessment/Plan:  This is a 72 y.o. female who is s/p  Repair Puncture in Right Femoral Graft for bleeding   -pt remains intubated.  Unable to get A-line due to severity of upper extremity peripheral disease.  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  -75cc bloody drainage in JP bulb, which is not unexpected. -acute blood loss anemia-pt received 4U PRBC's in OR - Pt requiring phenylephrine for support. -monophasic doppler signal left DP; toes on left foot ischemic--unsure how her foot looked prior to today.  Will d/w Dr. Arbie Cookey -CCM consulted   Doreatha Massed, PA-C 11/24/2017 2:49 PM [phone]

## 2017-11-22 NOTE — Anesthesia Preprocedure Evaluation (Signed)
Anesthesia Evaluation  Patient identified by MRN, date of birth, ID band Patient unresponsive    Reviewed: Patient's Chart, lab work & pertinent test results, Unable to perform ROS - Chart review only  Airway Mallampati: II       Dental  (+) Teeth Intact   Pulmonary former smoker,     + decreased breath sounds      Cardiovascular hypertension,  Rhythm:Regular Rate:Tachycardia     Neuro/Psych    GI/Hepatic   Endo/Other  diabetes  Renal/GU      Musculoskeletal   Abdominal   Peds  Hematology   Anesthesia Other Findings   Reproductive/Obstetrics                             Anesthesia Physical Anesthesia Plan  ASA: IV and emergent  Anesthesia Plan: General   Post-op Pain Management:    Induction: Intravenous  PONV Risk Score and Plan: Ondansetron and Dexamethasone  Airway Management Planned: Oral ETT  Additional Equipment: CVP  Intra-op Plan:   Post-operative Plan: Post-operative intubation/ventilation  Informed Consent:   Plan Discussed with: CRNA, Anesthesiologist and Surgeon  Anesthesia Plan Comments:         Anesthesia Quick Evaluation

## 2017-11-22 NOTE — Progress Notes (Signed)
Inpatient Diabetes Program Recommendations  AACE/ADA: New Consensus Statement on Inpatient Glycemic Control (2019)  Target Ranges:  Prepandial:   less than 140 mg/dL      Peak postprandial:   less than 180 mg/dL (1-2 hours)      Critically ill patients:  140 - 180 mg/dL  Results for [name], [name] (MRN [id number]) as of 11/04/2017 12:26  Ref. Range 11/30/2017 06:20 11/06/2017 12:04  Glucose-Capillary Latest Ref Range: 65 - 99 mg/dL 585 (H) 929 (H)   Results for [name], [name] (MRN [id number]) as of 11/19/2017 12:26  Ref. Range 11/09/2017 08:52 11/17/2017 10:13 11/20/2017 12:47 11/22/2017 15:02 11/29/2017 15:48 11/02/2017 16:35 11/30/2017 17:44 11/08/2017 18:01 11/13/2017 18:43 11/16/2017 21:20  Glucose-Capillary Latest Ref Range: 65 - 99 mg/dL 62 (L) 177 (H) 66 87 66 112 (H) 65 70 89 143 (H)   Review of Glycemic Control  Diabetes history: DM2 Outpatient Diabetes medications: Tresiba 16 units QAM, Metformin 500 mg BID Current orders for Inpatient glycemic control: Novolog 0-9 units TID with meals  Inpatient Diabetes Program Recommendations: Insulin - Basal: If glucose continues to be greater than 180 mg/dl, please consider ordering Lantus 5 units daily. Correction (SSI): Please consider ordering Novolog 0-5 units QHS.  NOTE: Noted patient was initially hypoglycemic which has resolved and CBG up to 278 mg/dl.   Thanks, Orlando Penner, RN, MSN, CDE Diabetes Coordinator Inpatient Diabetes Program [phone] (Team Pager from 8am to 5pm)

## 2017-11-22 NOTE — Addendum Note (Signed)
Addendum  created 11/25/2017 2055 by Kipp Brood, MD   Intraprocedure Blocks edited, Sign clinical note

## 2017-11-22 NOTE — Progress Notes (Signed)
2 CBCs have been sent down to lab, and both have been called back to be clotted in the platelets.  hgb have both been stable around 15.  At this time, husband will not let me draw anymore blood from the pt.  MD aware.

## 2017-11-23 ENCOUNTER — Telehealth: Payer: Self-pay | Admitting: Vascular Surgery

## 2017-11-23 ENCOUNTER — Encounter (HOSPITAL_COMMUNITY): Payer: Self-pay | Admitting: Vascular Surgery

## 2017-11-23 DIAGNOSIS — Z978 Presence of other specified devices: Secondary | ICD-10-CM

## 2017-11-23 LAB — CBC
HCT: 34.7 % — ABNORMAL LOW (ref 36.0–46.0)
HCT: 31.7 % — ABNORMAL LOW (ref 36.0–46.0)
HEMATOCRIT: 29.6 % — AB (ref 36.0–46.0)
Hemoglobin: 12 g/dL (ref 12.0–15.0)
Hemoglobin: 11 g/dL — ABNORMAL LOW (ref 12.0–15.0)
Hemoglobin: 10.1 g/dL — ABNORMAL LOW (ref 12.0–15.0)
MCH: 30 pg (ref 26.0–34.0)
MCH: 30.2 pg (ref 26.0–34.0)
MCH: 29.9 pg (ref 26.0–34.0)
MCHC: 34.6 g/dL (ref 30.0–36.0)
MCHC: 34.7 g/dL (ref 30.0–36.0)
MCHC: 34.1 g/dL (ref 30.0–36.0)
MCV: 86.8 fL (ref 78.0–100.0)
MCV: 87.1 fL (ref 78.0–100.0)
MCV: 87.6 fL (ref 78.0–100.0)
PLATELETS: 134 10*3/uL — AB (ref 150–400)
PLATELETS: 164 10*3/uL (ref 150–400)
PLATELETS: 162 10*3/uL (ref 150–400)
RBC: 4 MIL/uL (ref 3.87–5.11)
RBC: 3.64 MIL/uL — ABNORMAL LOW (ref 3.87–5.11)
RBC: 3.38 MIL/uL — AB (ref 3.87–5.11)
RDW: 14.1 % (ref 11.5–15.5)
RDW: 14.5 % (ref 11.5–15.5)
RDW: 14.8 % (ref 11.5–15.5)
WBC: 27.4 10*3/uL — ABNORMAL HIGH (ref 4.0–10.5)
WBC: 29.3 10*3/uL — ABNORMAL HIGH (ref 4.0–10.5)
WBC: 26.7 10*3/uL — AB (ref 4.0–10.5)

## 2017-11-23 LAB — GLUCOSE, CAPILLARY
GLUCOSE-CAPILLARY: 110 mg/dL — AB (ref 65–99)
GLUCOSE-CAPILLARY: 126 mg/dL — AB (ref 65–99)
GLUCOSE-CAPILLARY: 128 mg/dL — AB (ref 65–99)
GLUCOSE-CAPILLARY: 59 mg/dL — AB (ref 65–99)
GLUCOSE-CAPILLARY: 83 mg/dL (ref 65–99)
Glucose-Capillary: 286 mg/dL — ABNORMAL HIGH (ref 65–99)
Glucose-Capillary: 49 mg/dL — ABNORMAL LOW (ref 65–99)
Glucose-Capillary: 109 mg/dL — ABNORMAL HIGH (ref 65–99)
Glucose-Capillary: 126 mg/dL — ABNORMAL HIGH (ref 65–99)

## 2017-11-23 LAB — BASIC METABOLIC PANEL
Anion gap: 9 (ref 5–15)
BUN: 17 mg/dL (ref 6–20)
CALCIUM: 7.1 mg/dL — AB (ref 8.9–10.3)
CHLORIDE: 113 mmol/L — AB (ref 101–111)
CO2: 17 mmol/L — ABNORMAL LOW (ref 22–32)
CREATININE: 1.77 mg/dL — AB (ref 0.44–1.00)
GFR, EST AFRICAN AMERICAN: 32 mL/min — AB (ref >60)
GFR, EST NON AFRICAN AMERICAN: 28 mL/min — AB (ref >60)
Glucose, Bld: 215 mg/dL — ABNORMAL HIGH (ref 65–99)
Potassium: 4.6 mmol/L (ref 3.5–5.1)
SODIUM: 139 mmol/L (ref 135–145)

## 2017-11-23 LAB — BLOOD PRODUCT ORDER (VERBAL) VERIFICATION

## 2017-11-23 LAB — MAGNESIUM: MAGNESIUM: 1.3 mg/dL — AB (ref 1.7–2.4)

## 2017-11-23 LAB — PHOSPHORUS: Phosphorus: 4.9 mg/dL — ABNORMAL HIGH (ref 2.5–4.6)

## 2017-11-23 MED ORDER — OMEGA-3-ACID ETHYL ESTERS 1 G PO CAPS
2.0000 g | ORAL_CAPSULE | Freq: Two times a day (BID) | ORAL | Status: DC
  Administered 2017-11-23 – 2017-11-26 (×6): 2 g via ORAL
  Filled 2017-11-23 (×6): qty 2

## 2017-11-23 MED ORDER — FUROSEMIDE 40 MG PO TABS
40.0000 mg | ORAL_TABLET | Freq: Two times a day (BID) | ORAL | Status: DC
  Administered 2017-11-23 – 2017-11-26 (×8): 40 mg via ORAL
  Filled 2017-11-23 (×8): qty 1

## 2017-11-23 MED ORDER — MORPHINE SULFATE 2 MG/ML IV SOLN
INTRAVENOUS | Status: DC
  Administered 2017-11-24: 0.5 mg via INTRAVENOUS
  Administered 2017-11-24: 2 mg via INTRAVENOUS
  Administered 2017-11-24: 1 mg via INTRAVENOUS
  Administered 2017-11-25: 3.5 mg via INTRAVENOUS
  Administered 2017-11-25: 2.5 mg via INTRAVENOUS
  Administered 2017-11-26: 1 mg via INTRAVENOUS
  Administered 2017-11-26: 13:00:00 via INTRAVENOUS
  Administered 2017-11-27: 3.5 mg via INTRAVENOUS
  Administered 2017-11-27 (×2): 2 mg via INTRAVENOUS
  Administered 2017-11-27: 17:00:00 via INTRAVENOUS
  Administered 2017-11-27: 1 mg via INTRAVENOUS
  Administered 2017-11-27: 4 mg via INTRAVENOUS
  Administered 2017-11-27: 3 mg via INTRAVENOUS
  Administered 2017-11-28: 38 mg via INTRAVENOUS
  Administered 2017-11-28: 4 mg via INTRAVENOUS
  Administered 2017-11-28: 38.5 mg via INTRAVENOUS
  Administered 2017-11-28: 1.5 mg via INTRAVENOUS
  Administered 2017-11-28: 0.5 mg via INTRAVENOUS
  Administered 2017-11-28: 2 mg via INTRAVENOUS
  Administered 2017-11-29: 2.5 mg via INTRAVENOUS
  Administered 2017-11-29: 5.5 mg via INTRAVENOUS
  Administered 2017-11-29: 2.5 mg via INTRAVENOUS
  Administered 2017-11-29: 2 mg via INTRAVENOUS
  Administered 2017-11-29: 4 mg via INTRAVENOUS
  Administered 2017-11-29: 5.5 mg via INTRAVENOUS
  Administered 2017-11-30: 15:00:00 via INTRAVENOUS
  Administered 2017-11-30: 2 mg via INTRAVENOUS
  Administered 2017-11-30: 3 mg via INTRAVENOUS
  Administered 2017-11-30: 0 mg via INTRAVENOUS
  Administered 2017-11-30: 0.5 mg via INTRAVENOUS
  Administered 2017-12-01: 1 mg via INTRAVENOUS
  Administered 2017-12-01: 4.5 mg via INTRAVENOUS
  Administered 2017-12-02: 1.5 mg via INTRAVENOUS
  Administered 2017-12-02: 2.5 mg via INTRAVENOUS
  Administered 2017-12-02 (×2): 3 mg via INTRAVENOUS
  Administered 2017-12-02: 5.5 mg via INTRAVENOUS
  Administered 2017-12-03: 0.5 mg via INTRAVENOUS
  Administered 2017-12-03: 2 mg via INTRAVENOUS
  Administered 2017-12-03: 2.5 mg via INTRAVENOUS
  Administered 2017-12-03: 7.5 mg via INTRAVENOUS
  Administered 2017-12-03 (×2): 3.5 mg via INTRAVENOUS
  Administered 2017-12-04: 5 mg via INTRAVENOUS
  Administered 2017-12-04: 0 mg via INTRAVENOUS
  Administered 2017-12-04: 3 mg via INTRAVENOUS
  Administered 2017-12-04: 2 mg via INTRAVENOUS
  Administered 2017-12-04: 3 mg via INTRAVENOUS
  Administered 2017-12-04: 1 mg via INTRAVENOUS
  Administered 2017-12-05: 22:00:00 via INTRAVENOUS
  Administered 2017-12-05: 2.5 mg via INTRAVENOUS
  Administered 2017-12-05: 7.5 mg via INTRAVENOUS
  Administered 2017-12-05: 9.5 mg via INTRAVENOUS
  Administered 2017-12-05: 5 mg via INTRAVENOUS
  Administered 2017-12-06: 2 mg via INTRAVENOUS
  Administered 2017-12-06: 7 mg via INTRAVENOUS
  Filled 2017-11-23 (×8): qty 30
  Filled 2017-11-23: qty 60
  Filled 2017-11-23: qty 30

## 2017-11-23 MED ORDER — MEPERIDINE HCL 50 MG PO TABS: 50.0000 mg | ORAL_TABLET | ORAL | Status: DC | PRN

## 2017-11-23 MED ORDER — ISOSORBIDE MONONITRATE ER 30 MG PO TB24
30.0000 mg | ORAL_TABLET | Freq: Every day | ORAL | Status: DC
  Administered 2017-11-23: 30 mg via ORAL
  Filled 2017-11-23: qty 1

## 2017-11-23 MED ORDER — DEXTROSE 50 % IV SOLN
INTRAVENOUS | Status: AC
  Administered 2017-11-23: 25 mL via INTRAVENOUS
  Filled 2017-11-23: qty 50

## 2017-11-23 MED ORDER — ASPIRIN EC 81 MG PO TBEC
81.0000 mg | DELAYED_RELEASE_TABLET | Freq: Every day | ORAL | Status: DC
  Administered 2017-11-23 – 2017-11-26 (×4): 81 mg via ORAL
  Filled 2017-11-23 (×4): qty 1

## 2017-11-23 MED ORDER — NALOXONE HCL 0.4 MG/ML IJ SOLN: 0.4000 mg | INTRAMUSCULAR | Status: DC | PRN

## 2017-11-23 MED ORDER — ROSUVASTATIN CALCIUM 10 MG PO TABS: 10.0000 mg | ORAL_TABLET | ORAL | Status: DC

## 2017-11-23 MED ORDER — INSULIN DEGLUDEC 200 UNIT/ML ~~LOC~~ SOPN: 16.0000 [IU] | PEN_INJECTOR | SUBCUTANEOUS | Status: DC

## 2017-11-23 MED ORDER — INSULIN GLARGINE 100 UNIT/ML ~~LOC~~ SOLN
16.0000 [IU] | Freq: Every day | SUBCUTANEOUS | Status: DC
  Administered 2017-11-23 – 2017-11-24 (×2): 16 [IU] via SUBCUTANEOUS
  Filled 2017-11-23 (×3): qty 0.16

## 2017-11-23 MED ORDER — CALCIUM CARBONATE ANTACID 500 MG PO CHEW: 1000.0000 mg | CHEWABLE_TABLET | Freq: Every day | ORAL | Status: DC | PRN

## 2017-11-23 MED ORDER — SODIUM CHLORIDE 0.9% FLUSH: 9.0000 mL | INTRAVENOUS | Status: DC | PRN

## 2017-11-23 MED ORDER — METFORMIN HCL 500 MG PO TABS: 500.0000 mg | ORAL_TABLET | Freq: Two times a day (BID) | ORAL | Status: DC

## 2017-11-23 MED ORDER — DEXTROSE 50 % IV SOLN
1.0000 | Freq: Once | INTRAVENOUS | Status: AC
  Administered 2017-11-23 – 2017-11-24 (×3): 50 mL via INTRAVENOUS

## 2017-11-23 MED ORDER — MORPHINE SULFATE (PF) 2 MG/ML IV SOLN
2.0000 mg | INTRAVENOUS | Status: DC | PRN
  Administered 2017-11-23 (×2): 2 mg via INTRAVENOUS
  Filled 2017-11-23 (×2): qty 1

## 2017-11-23 MED ORDER — DEXTROSE-NACL 5-0.45 % IV SOLN
INTRAVENOUS | Status: AC
  Administered 2017-11-23 – 2017-12-02 (×14): via INTRAVENOUS

## 2017-11-23 MED ORDER — SODIUM CHLORIDE 0.9 % IV BOLUS
1000.0000 mL | Freq: Once | INTRAVENOUS | Status: AC
  Administered 2017-11-23: 1000 mL via INTRAVENOUS

## 2017-11-23 MED ORDER — SODIUM CHLORIDE 0.45 % IV SOLN: INTRAVENOUS | Status: DC

## 2017-11-23 MED ORDER — PROCHLORPERAZINE MALEATE 10 MG PO TABS
10.0000 mg | ORAL_TABLET | Freq: Four times a day (QID) | ORAL | Status: DC | PRN
  Administered 2017-11-23: 10 mg via ORAL
  Filled 2017-11-23: qty 1

## 2017-11-23 MED ORDER — ALPRAZOLAM 0.25 MG PO TABS: 0.2500 mg | ORAL_TABLET | Freq: Every day | ORAL | Status: DC | PRN

## 2017-11-23 MED ORDER — DIPHENHYDRAMINE HCL 50 MG/ML IJ SOLN: 12.5000 mg | Freq: Four times a day (QID) | INTRAMUSCULAR | Status: DC | PRN

## 2017-11-23 MED ORDER — METOPROLOL SUCCINATE ER 50 MG PO TB24
50.0000 mg | ORAL_TABLET | Freq: Every day | ORAL | Status: DC
  Administered 2017-11-23 – 2017-11-26 (×4): 50 mg via ORAL
  Filled 2017-11-23 (×4): qty 1

## 2017-11-23 MED ORDER — SALINE SPRAY 0.65 % NA SOLN: 2.0000 | NASAL | Status: DC | PRN

## 2017-11-23 MED ORDER — POLYSACCHARIDE IRON COMPLEX 150 MG PO CAPS
300.0000 mg | ORAL_CAPSULE | Freq: Two times a day (BID) | ORAL | Status: DC
Start: 2017-11-23 — End: 2017-11-27
  Administered 2017-11-23 – 2017-11-26 (×7): 300 mg via ORAL
  Filled 2017-11-23 (×10): qty 2

## 2017-11-23 MED ORDER — DEXTROSE 50 % IV SOLN
25.0000 mL | Freq: Once | INTRAVENOUS | Status: AC
  Administered 2017-11-23: 25 mL via INTRAVENOUS

## 2017-11-23 MED ORDER — NITROGLYCERIN 0.4 MG SL SUBL: 0.4000 mg | SUBLINGUAL_TABLET | SUBLINGUAL | Status: DC | PRN

## 2017-11-23 MED ORDER — MORPHINE SULFATE 2 MG/ML IV SOLN
INTRAVENOUS | Status: DC
  Administered 2017-11-23: 10:00:00 via INTRAVENOUS
  Administered 2017-11-23: 11 mg via INTRAVENOUS

## 2017-11-23 MED ORDER — SULFAMETHOXAZOLE-TRIMETHOPRIM 400-80 MG PO TABS
1.0000 | ORAL_TABLET | Freq: Two times a day (BID) | ORAL | Status: DC
  Administered 2017-11-23: 1 via ORAL
  Filled 2017-11-23: qty 1

## 2017-11-23 MED ORDER — DEXTROSE 50 % IV SOLN
INTRAVENOUS | Status: AC
  Administered 2017-11-23: 50 mL via INTRAVENOUS
  Filled 2017-11-23: qty 50

## 2017-11-23 MED ORDER — DIPHENHYDRAMINE HCL 12.5 MG/5ML PO ELIX
12.5000 mg | ORAL_SOLUTION | Freq: Four times a day (QID) | ORAL | Status: DC | PRN
  Filled 2017-11-23: qty 5

## 2017-11-23 NOTE — Telephone Encounter (Signed)
-----   Message from Westley Hummer, RN sent at 12-05-17  2:21 PM EDT ----- Regarding: Appointment   ----- Message ----- From: Nada Libman, MD Sent: 05-Dec-2017   2:04 PM To: Vvs Charge Pool  12-05-2017:  Surgeon:  [name] Procedure Performed:  1.  Ultrasound-guided access, right femoral artery  2.  Ultrasound-guided access, left femoral artery  3.  Abdominal aortogram  4.  Left lower extremity runoff  5.  Stent, left superficial femoral artery  6.  Balloon angioplasty, left peroneal and tibioperoneal trunk  7.  Conscious sedation (118 minutes)    F/u Dr Arbie Cookey in 3 months with ABI and duplex of left leg

## 2017-11-23 NOTE — Progress Notes (Addendum)
[facility]  Subjective  - Pain is an issue.  We will start low dose Morphine PCA.   Objective (!) 85/51 (!) 123 98.3 F (36.8 C) (Oral) (!) 23 100%  Intake/Output Summary (Last 24 hours) at 11/23/2017 0801 Last data filed at 11/23/2017 0600 Gross per 24 hour  Intake 5734.07 ml  Output 1815 ml  Net 3919.07 ml    Doppler signals found left popliteal and peroneal. Right groin significant ecchymosis, softer, painful to touch. Toes and finger dusky appearance with coolness to touch. Dry 2 x 2 between toes left foot Heart RRR  Lungs non labored breathing Gen NAD  Assessment/Planning: Post angio pseudoaneursym POD # 1  PROCEDURE: Evacuation and exploration of right groin with closure of puncture of right femoral graft  POD# 2  Procedure Performed:             1.  Ultrasound-guided access, right femoral artery             2.  Ultrasound-guided access, left femoral artery             3.  Abdominal aortogram             4.  Left lower extremity runoff             5.  Stent, left superficial femoral artery             6.  Balloon angioplasty, left peroneal and tibioperoneal trunk             7.  Conscious sedation (118 minutes    Started low dose PCA Morphine Dsky fingers and left foot, now off Levophed.  Warm blankets to left LE Left LE patent with doppler signal peroneal .   Ordered home medication Altace to be given and not alt. Restarted home medication today. WBC 27.4 this am Blood loss anemia replaced 4 units PRBC.  HGB 12 this am. Vancomycin has been stopped , cont. Bactrim for UTI Cr elevated 1.77, UO 395 cc since surgery JP drain in place right groin 420 cc out put.  Maintain drain.   [name] 11/23/2017 8:01 AM --  Laboratory Lab Results: Recent Labs    11/14/2017 1200 11/24/2017 1552 11/23/17 0340  WBC 7.3  --  27.4*  HGB 6.5* 13.6 12.0  HCT 20.6* 40.0 34.7*  PLT 365  --  134*   BMET Recent Labs   11/01/2017 1707 11/23/17 0340  NA 138 139  K 4.4 4.6  CL 116* 113*  CO2 16* 17*  GLUCOSE 329* 215*  BUN 11 17  CREATININE 1.21* 1.77*  CALCIUM 6.8* 7.1*    COAG Lab Results  Component Value Date   INR 1.60 (H) 12/11/2015   INR 1.07 08/19/2015   INR 1.01 06/05/2015   No results found for: PTT  I have examined the patient, reviewed and agree with above.  Remains exquisitely tender over the area of large hematoma evacuation yesterday.  Completely off levo fed.  Peroneal signal on the left with cyanosis of both hands and her left foot.  H&H stable.  Will try a PCA morphine to get her better under control from a pain standpoint.  Discussed with patient's husband present  Gretta Began, MD 11/23/2017 10:03 AM

## 2017-11-23 NOTE — Progress Notes (Signed)
Inpatient Diabetes Program Recommendations  AACE/ADA: New Consensus Statement on Inpatient Glycemic Control (2015)  Target Ranges:  Prepandial:   less than 140 mg/dL      Peak postprandial:   less than 180 mg/dL (1-2 hours)      Critically ill patients:  140 - 180 mg/dL   Lab Results  Component Value Date   GLUCAP 109 (H) 11/23/2017   HGBA1C 6.1 (H) 12/14/2015    Review of Glycemic Control Results for [name], [name] (MRN [id number]) as of 11/23/2017 12:52  Ref. Range 11/23/2017 03:46 11/23/2017 04:21 11/23/2017 09:49 11/23/2017 12:08  Glucose-Capillary Latest Ref Range: 65 - 99 mg/dL 49 (L) 83 034 (H) 917 (H)   Diabetes history: Type 2 DM Outpatient Diabetes medications: Tresiba 16 units QAM, Metformin 500 mg BID Current orders for Inpatient glycemic control: Lantus 16 units QD, Novolog 0-15 units Q4H  Inpatient Diabetes Program Recommendations:   Last A1C was from 2017, consider repeating?  Noted new orders placed for Lantus 16 units QD. This was placed following admin of D50- 25 cc for AM BS of 45 mg/dL. On 5/22 patient received total daily insulin dose of 15 units. Appears to have possibly stacked doses given renal status. With multiple episodes of hypoglycemia during this admission, would be hesitant to resume home dose of Lantus. Would recommend decreasing Lantus 6 units QD (72.7 kg x .1).  Spoke with [place], RN regarding patient. Discussed the multiple variables involved in this patient's care to include: s/p transfusion of multiple blood products, accuracy of lab results, lack of blood flow to periphery, and treatment with D50. Encouraged to get another CBG on patient using lab draw technique to ensure accuracy. Expressed that patient only had 15 units total on 5/22 and has a high risk of having hypoglycemia today following Lantus 16 units QD. Will continue to follow.   Thanks, Lujean Rave, MSN, RNC-OB Diabetes Coordinator [phone] (8a-5p)

## 2017-11-23 NOTE — Progress Notes (Signed)
Hypoglycemic Event  CBG:  59 Treatment: D50 IV 50 mL  Symptoms: None  Follow-up CBG: Time:2150 CBG Result:126  Possible Reasons for Event: Medication regimen: d5 1/5 NS  Comments/MD notified:  Given per protocol. Pt asymptomatic, mentation fine.   [name]  Samel Bruna

## 2017-11-23 NOTE — Progress Notes (Signed)
0400 CBG 49. Patient given D50 47ml and CBG corrected to 83.

## 2017-11-23 NOTE — Progress Notes (Signed)
Weaning levophed down. L femoral pulse good, patient mental status appropriate, urine output decreasing. Dr Early updated via phone. NS bolus of 500 ml over 2 hrs ordered.

## 2017-11-23 NOTE — Care Management Note (Signed)
Case Management Note Donn Pierini RN,BSN Unit [facility] 1-22 Case Manager  [phone]  Patient Details  Name: [name] MRN: [id number] Date of Birth: 08/05/1945  Subjective/Objective:   Pt admitted s/p balloon angioplasty and stenting of left femoral artery on 5/21- on 5/22 prior to discharge pt developed a recurrent pseudoaneurysm involving the right groin area- with loss of consciousness- taken back to OR for emergent evacuation and exploration of right groin with closure of puncture to right femoral graft. - ICU post op for Vent management-                  Action/Plan: PTA pt lived at home with spouse- anticipate return home- CM to follow for transition of care needs.   Expected Discharge Date:  11/16/2017               Expected Discharge Plan:     In-House Referral:     Discharge planning Services  CM Consult  Post Acute Care Choice:    Choice offered to:     DME Arranged:    DME Agency:     HH Arranged:    HH Agency:     Status of Service:  In process, will continue to follow  If discussed at Long Length of Stay Meetings, dates discussed:    Discharge Disposition:   Additional Comments:  Darrold Span, RN 11/23/2017, 11:15 AM

## 2017-11-23 NOTE — Progress Notes (Signed)
Levophed off for about 2 hrs and BP reading better. Mental status appropriate and L fem pulse unchanged.

## 2017-11-23 NOTE — Progress Notes (Signed)
PULMONARY / CRITICAL CARE MEDICINE   Name: [name] MRN: [id number] DOB: September 28, 1945    ADMISSION DATE:  11/25/2017 CONSULTATION DATE:  5/22  REFERRING MD:  Early   CHIEF COMPLAINT:  S/p cardiac arrest: ventilatory and critical care management   HISTORY OF PRESENT ILLNESS:   This is a 72 year old female patient with a history of occluded left superficial femoral and profundofemoral artery.  She initially did not respond to balloon angioplasty and ultimately required drug-coated stenting using a 6 mm stent.  The procedure was pleaded on 5/21 and she was to be discharged on 5/22 with follow-up with vascular surgery and to be maintained on Plavix and aspirin for 1 month.  Currently the patient was getting dressed around noontime on 5/22 when she developed a recurrent pseudoaneurysm involving the right groin area.  She acutely became diaphoretic and hypotensive.  Vascular surgery was called emergently to the bedside, pressure was held fluid volume resuscitation was initiated and rapid response called.  This rapidly progressed to agonal respiratory efforts Requiring intubation for loss of consciousness.  Her initial hemoglobin was 6.5, this had dropped from 8.3.  She was emergently transported to the OR where she underwent emergent evacuation and exploration of the right groin, and closure of puncture right femoral graft.  Review of anesthesia notes show: She received a total of 1300 mL of blood, 2000 mL of lactated Ringer's, intermittent phenylephrine infusion.  Her EBL was estimated at 1 L., she returned to the intensive care on the mechanical ventilator,    SUBJECTIVE:  Off pressors.  Extubated last night.  Complains of right flank pain. VITAL SIGNS: Blood Pressure (Abnormal) 118/93   Pulse (Abnormal) 121   Temperature 98.3 F (36.8 C) (Oral)   Respiration 16   Height 5\' 4"  (1.626 m)   Weight 160 lb 0.9 oz (72.6 kg)   Oxygen Saturation 100%   Body Mass Index 27.47 kg/m    HEMODYNAMICS:    VENTILATOR SETTINGS: Vent Mode: PRVC FiO2 (%):  [50 %-100 %] 100 % Set Rate:  [16 bmp] 16 bmp Vt Set:  [440 mL-500 mL] 440 mL PEEP:  [5 cmH20] 5 cmH20 Pressure Support:  [10 cmH20] 10 cmH20 Plateau Pressure:  [20 cmH20] 20 cmH20  INTAKE / OUTPUT:  Intake/Output Summary (Last 24 hours) at 11/23/2017 0954 Last data filed at 11/23/2017 0600 Gross per 24 hour  Intake 5474.07 ml  Output 1815 ml  Net 3659.07 ml     PHYSICAL EXAMINATION: General: This is 72 year old female currently resting in bed.  She rates her pain 10 out of 10 on her flank right side  HEENT normocephalic atraumatic mucous membranes moist Pulmonary: Clear to auscultation decreased in bases no accessory use Cardiac: Regular rate and rhythm without murmur rub or gallop Extremity's: Cool, weak pulses, mottling has subsided.  Has lower extremity edema. Abdomen: Soft tender particularly over the right lower abdomen and flank positive bowel sounds General: Awake oriented no focal deficits GU: Clear yellow  LABS:  BMET Recent Labs  Lab 11/17/2017 0807 11/09/2017 1552 11/21/2017 1707 11/23/17 0340  NA 139 141 138 139  K 3.8 4.5 4.4 4.6  CL 107  --  116* 113*  CO2 25  --  16* 17*  BUN 9  --  11 17  CREATININE 0.94  --  1.21* 1.77*  GLUCOSE 202* 305* 329* 215*    Electrolytes Recent Labs  Lab 11/09/2017 0807 11/15/2017 1707 11/23/17 0340  CALCIUM 7.6* 6.8* 7.1*  MG  --   --  1.3*  PHOS  --   --  4.9*    CBC Recent Labs  Lab 12/02/17 0807 12/02/17 1200 12/02/17 1552 11/23/17 0340  WBC 8.0 7.3  --  27.4*  HGB 8.3* 6.5* 13.6 12.0  HCT 26.4* 20.6* 40.0 34.7*  PLT 320 365  --  134*    Coag's No results for input(s): APTT, INR in the last 168 hours.  Sepsis Markers No results for input(s): LATICACIDVEN, PROCALCITON, O2SATVEN in the last 168 hours.  ABG No results for input(s): PHART, PCO2ART, PO2ART in the last 168 hours.  Liver Enzymes No results for input(s): AST, ALT,  ALKPHOS, BILITOT, ALBUMIN in the last 168 hours.  Cardiac Enzymes No results for input(s): TROPONINI, PROBNP in the last 168 hours.  Glucose Recent Labs  Lab 12-02-17 1204 Dec 02, 2017 1622 12-02-2017 2003 December 02, 2017 2350 11/23/17 0346 11/23/17 0421  GLUCAP 278* 286* 130* 110* 49* 83    Imaging Portable Chest X-ray  Result Date: 12-02-17 CLINICAL DATA:  Endotracheal tube placement. EXAM: PORTABLE CHEST 1 VIEW COMPARISON:  12/12/2015 FINDINGS: Endotracheal tube terminates 5 cm above the carina. Advancement with approximately 1-2 cm may be considered. Shock pads overlie the lower thorax. Left IJ approach central venous catheter terminates at the expected location of proximal superior vena cava. Cardiomediastinal silhouette is normal. Mediastinal contours appear intact. There is no evidence of focal airspace consolidation, pleural effusion or pneumothorax. Osseous structures are without acute abnormality. Soft tissues are grossly normal. IMPRESSION: Endotracheal tube terminates 5 cm above the carina. Advancement with approximately 1-2 cm may be considered. Electronically Signed   By: Ted Mcalpine M.D.   On: 12/02/2017 15:59     STUDIES:    CULTURES:   ANTIBIOTICS:   SIGNIFICANT EVENTS:   LINES/TUBES: Left IJ triple-lumen catheter placed 5/22 Oral endotracheal tube placed 5/22   DISCUSSION: 72 year old female with a history of peripheral vascular disease, and diabetes.  Presented for occluded left superficial femoral artery, underwent stenting however prior to discharge developed acute groin hematoma, profound hypotensive, and near cardiac arrest requiring emergent intubation and exploratory surgery where she underwent evacuation of hematoma and was found to have a unsure on the femoral sheath which was repaired.  Intraoperatively she received 4 units of blood,.  She is now extubated and off life support.  She is off pressors.  She is awake oriented and hemodynamically stable.   She does have significant flank discomfort which would be expected.  Also has new acute kidney injury most likely secondary to hypoperfusion state.  We will sign off.  However will go ahead and change her IV fluids to half-normal saline giving her hyperchloremic non-gap acidosis. Agree with PCA.  Would hold her antihypertensives for systolic less than 100   ASSESSMENT / PLAN: Resolved issues: Acute encephalopathy Hemorrhagic shock Acute respiratory failure  Current issues Acute blood loss anemia secondary to puncture of right femoral graft with resultant right groin hematoma in patient with severe peripheral vascular disease Now status post repair by vascular surgery Received total of 4 units of blood, pretransfusion hemoglobin 6.5 off pressors now.  Hemoglobin stable overnight does have right flank pain which would be expected Plan Holding anticoagulation, although aspirin has been added back by surgical service Continue pulse checks   History of coronary artery disease, now with tachycardia which I suspect is related to pain -Last EF estimated 55 to 60% back in 2017 Plan Continue telemetry Lopressor and Imdur added, would hold this for systolic less than 100  AKA with non-anion  gap metabolic acidosis in the setting of hyperchloremia -Suspect kidney injury was acute on chronic in the setting of low flow state Plan Change IV fluids to D5 half-normal at 50 cc an hour, avoiding lactated Ringer's due to borderline hyperkalemia Allow p.o. intake Renal dose medications Follow-up a.m. Chemistry Would hold off on Altace for now given worsening renal function Careful w/ diuresis   Hypomagnesemia Plan Replace and recheck  Diabetes with hyperglycemia Plan Sliding scale insulin   FAMILY  - Updates:   - Inter-disciplinary family meet or Palliative Care meeting due by: 5/29  Simonne Martinet ACNP-BC [facility] Pulmonary/Critical Care Pager # [phone] OR # [phone] if no  answer   11/23/2017, 9:54 AM

## 2017-11-23 NOTE — Progress Notes (Signed)
Bolus finished with no increase in urine output. Dr Early updated and no further orders received.

## 2017-11-23 NOTE — Telephone Encounter (Signed)
sch appt 02/20/18 10am LE Art 11am ABI 1130am f/u MD s/p Abd Aortogram, L LE runoff

## 2017-11-24 LAB — GLUCOSE, CAPILLARY
GLUCOSE-CAPILLARY: 177 mg/dL — AB (ref 65–99)
GLUCOSE-CAPILLARY: 185 mg/dL — AB (ref 65–99)
Glucose-Capillary: 159 mg/dL — ABNORMAL HIGH (ref 65–99)
Glucose-Capillary: 193 mg/dL — ABNORMAL HIGH (ref 65–99)
Glucose-Capillary: 26 mg/dL — CL (ref 65–99)
Glucose-Capillary: 72 mg/dL (ref 65–99)
Glucose-Capillary: 98 mg/dL (ref 65–99)

## 2017-11-24 LAB — CBC
HCT: 28.4 % — ABNORMAL LOW (ref 36.0–46.0)
HCT: 29 % — ABNORMAL LOW (ref 36.0–46.0)
HCT: 29.9 % — ABNORMAL LOW (ref 36.0–46.0)
HEMATOCRIT: 28 % — AB (ref 36.0–46.0)
Hemoglobin: 10.2 g/dL — ABNORMAL LOW (ref 12.0–15.0)
Hemoglobin: 9.6 g/dL — ABNORMAL LOW (ref 12.0–15.0)
Hemoglobin: 9.7 g/dL — ABNORMAL LOW (ref 12.0–15.0)
Hemoglobin: 9.9 g/dL — ABNORMAL LOW (ref 12.0–15.0)
MCH: 30.4 pg (ref 26.0–34.0)
MCH: 30.1 pg (ref 26.0–34.0)
MCH: 30.6 pg (ref 26.0–34.0)
MCH: 30.2 pg (ref 26.0–34.0)
MCHC: 34.1 g/dL (ref 30.0–36.0)
MCHC: 34.1 g/dL (ref 30.0–36.0)
MCHC: 34.2 g/dL (ref 30.0–36.0)
MCHC: 34.3 g/dL (ref 30.0–36.0)
MCV: 88.6 fL (ref 78.0–100.0)
MCV: 88.2 fL (ref 78.0–100.0)
MCV: 89.5 fL (ref 78.0–100.0)
MCV: 88.5 fL (ref 78.0–100.0)
PLATELETS: 160 10*3/uL (ref 150–400)
PLATELETS: 175 10*3/uL (ref 150–400)
Platelets: 160 10*3/uL (ref 150–400)
Platelets: 176 10*3/uL (ref 150–400)
RBC: 3.16 MIL/uL — ABNORMAL LOW (ref 3.87–5.11)
RBC: 3.22 MIL/uL — ABNORMAL LOW (ref 3.87–5.11)
RBC: 3.24 MIL/uL — ABNORMAL LOW (ref 3.87–5.11)
RBC: 3.38 MIL/uL — AB (ref 3.87–5.11)
RDW: 15 % (ref 11.5–15.5)
RDW: 15.2 % (ref 11.5–15.5)
RDW: 15.2 % (ref 11.5–15.5)
RDW: 15.3 % (ref 11.5–15.5)
WBC: 15.5 10*3/uL — ABNORMAL HIGH (ref 4.0–10.5)
WBC: 18.3 10*3/uL — AB (ref 4.0–10.5)
WBC: 22.4 10*3/uL — ABNORMAL HIGH (ref 4.0–10.5)
WBC: 23.5 10*3/uL — ABNORMAL HIGH (ref 4.0–10.5)

## 2017-11-24 LAB — BASIC METABOLIC PANEL
Anion gap: 8 (ref 5–15)
Anion gap: 8 (ref 5–15)
BUN: 29 mg/dL — AB (ref 6–20)
BUN: 30 mg/dL — AB (ref 6–20)
CALCIUM: 7.3 mg/dL — AB (ref 8.9–10.3)
CHLORIDE: 111 mmol/L (ref 101–111)
CO2: 18 mmol/L — ABNORMAL LOW (ref 22–32)
CO2: 19 mmol/L — ABNORMAL LOW (ref 22–32)
CREATININE: 2.62 mg/dL — AB (ref 0.44–1.00)
CREATININE: 2.63 mg/dL — AB (ref 0.44–1.00)
Calcium: 7.3 mg/dL — ABNORMAL LOW (ref 8.9–10.3)
Chloride: 112 mmol/L — ABNORMAL HIGH (ref 101–111)
GFR calc Af Amer: 20 mL/min — ABNORMAL LOW (ref >60)
GFR calc Af Amer: 20 mL/min — ABNORMAL LOW (ref >60)
GFR calc non Af Amer: 17 mL/min — ABNORMAL LOW (ref >60)
GFR, EST NON AFRICAN AMERICAN: 17 mL/min — AB (ref >60)
Glucose, Bld: 104 mg/dL — ABNORMAL HIGH (ref 65–99)
Glucose, Bld: 85 mg/dL (ref 65–99)
Potassium: 4.9 mmol/L (ref 3.5–5.1)
Potassium: 5 mmol/L (ref 3.5–5.1)
SODIUM: 138 mmol/L (ref 135–145)
Sodium: 138 mmol/L (ref 135–145)

## 2017-11-24 LAB — OCCULT BLOOD X 1 CARD TO LAB, STOOL: FECAL OCCULT BLD: NEGATIVE

## 2017-11-24 MED ORDER — SODIUM CHLORIDE 0.9% FLUSH
10.0000 mL | INTRAVENOUS | Status: DC | PRN
  Administered 2017-12-06 – 2017-12-08 (×3): 10 mL
  Filled 2017-11-24 (×3): qty 40

## 2017-11-24 MED ORDER — DEXTROSE 50 % IV SOLN
INTRAVENOUS | Status: AC
  Administered 2017-11-24: 50 mL via INTRAVENOUS
  Filled 2017-11-24: qty 50

## 2017-11-24 MED ORDER — SODIUM CHLORIDE 0.9% FLUSH
10.0000 mL | Freq: Two times a day (BID) | INTRAVENOUS | Status: DC
  Administered 2017-11-24 – 2017-12-02 (×9): 10 mL
  Administered 2017-12-02: 20 mL
  Administered 2017-12-03: 10 mL
  Administered 2017-12-03: 20 mL
  Administered 2017-12-04 – 2017-12-07 (×5): 10 mL
  Administered 2017-12-08: 30 mL
  Administered 2017-12-08 – 2017-12-12 (×8): 10 mL

## 2017-11-24 MED ORDER — CHLORHEXIDINE GLUCONATE CLOTH 2 % EX PADS
6.0000 | MEDICATED_PAD | Freq: Every day | CUTANEOUS | Status: DC
  Administered 2017-11-24 – 2017-12-12 (×13): 6 via TOPICAL

## 2017-11-24 MED ORDER — MAGNESIUM SULFATE 4 GM/100ML IV SOLN
4.0000 g | Freq: Once | INTRAVENOUS | Status: AC
  Administered 2017-11-24: 4 g via INTRAVENOUS
  Filled 2017-11-24: qty 100

## 2017-11-24 MED FILL — Medication: Qty: 1 | Status: AC

## 2017-11-24 NOTE — Progress Notes (Signed)
Multiple issues overnight:  -blood sugar dropped to 59, was corrected by an amp of D50 and D5 1/2NS. (controlled/managed by ICU hypoglycemic protocol)  -Hematoma to R side of abdomen/ groin much larger, on call [facility] (Dr. Randie Heinz) made aware. Labs were drawn and 1L NS bolus given.  - BP continuously trending downwards, levophed, that already existed as an order restarted, on call VVS, Dr. Randie Heinz made aware.  - Pt had medium sized black, tarry stool. Obtained a sample and called on call VVS, Dr. Randie Heinz, and he ordered a hemocult study.

## 2017-11-24 NOTE — Progress Notes (Addendum)
[facility] [place]  Subjective  - Hypotension via a line levophed restarted last night.   Objective (!) 103/49 (!) 116 (!) 97.5 F (36.4 C) (Oral) (!) 21 100%  Intake/Output Summary (Last 24 hours) at 11/24/2017 0755 Last data filed at 11/24/2017 0700 Gross per 24 hour  Intake 2026.09 ml  Output 530 ml  Net 1496.09 ml    Right groin with significant ecchymosis, soft without recurrent hematoma. Left peroneal doppler signal, popliteal signal.  Left foot pink and warm. Dry guaze placed between toes Heart Tachy 116, BP systolic low 100's Lungs non labored breathing Gen alert and NAD  Assessment/Planning: Post angio pseudoaneursym POD # 1  PROCEDURE:Evacuation and exploration of right groin with closure of puncture of right femoral graft  POD# 2  Procedure Performed: 1. Ultrasound-guided access, right femoral artery 2. Ultrasound-guided access, left femoral artery 3. Abdominal aortogram 4. Left lower extremity runoff 5. Stent, left superficial femoral artery 6. Balloon angioplasty, left peroneal and tibioperoneal trunk 7. Conscious sedation (118 minutes   Continue PCA for pain control 1 mg dose adjusted yesterday Fingers pink and warm Plan to wean off Levophed over the next 2 hours.  BP adjustments not driven per central line, check left femoral pulse and monitor UO.   Cr 2.63, UO 370 cc daily total JP drain 160cc Continue Plavix for 1 month hemoccult neg.  Will monitor.  Hx of GI bleed.  [name] 11/24/2017 7:55 AM --  Laboratory Lab Results: Recent Labs    11/23/17 2333 11/24/17 0341  WBC 23.5* 22.4*  HGB 9.9* 9.6*  HCT 29.0* 28.0*  PLT 160 160   BMET Recent Labs    11/23/17 2333 11/24/17 0341  NA 138 138  K 4.9 5.0  CL 112* 111  CO2 18* 19*  GLUCOSE 104* 85  BUN 29* 30*  CREATININE 2.62* 2.63*  CALCIUM 7.3* 7.3*     COAG Lab Results  Component Value Date   INR 1.60 (H) 12/11/2015   INR 1.07 08/19/2015   INR 1.01 06/05/2015   No results found for: PTT  I agree with the above.  I seen and evaluated the patient Acute renal insufficiency: The patient's creatinine has increased to 2.6.  This is likely secondary to hypovolemic shock as well as from contrast.  It appears to be stabilizing.  We will continue with IV hydration.  Lower extremity ischemia: Yesterday, the patient's upper extremities and left lower extremity were mottled in appearance.  This has resolved.  I suspect this was secondary to pressors to maintain adequate blood pressure.  Her pressures were restarted last night however I think these need to be discontinued to ensure maximum digital perfusion.  She is difficult to get a blood pressure on because of her vascular disease.  I have spoken with the nursing staff and as long as she has a palpable femoral pulse, is making urine, and is mentating, I would not get fixated on what her blood pressure readings are as they are not accurate.  Questionable blood in her stool: She reportedly had a dark stool.  This was Hemoccult negative.  She does have a history of GI bleeding in the past while she was on Plavix.  She was giving a loading dose of Plavix after her arteriogram and I have continue this.  My plan is to to continue this for at least 1 month and hopefully 3 months as long as there is no bleeding.  Acute blood loss anemia.  Her hemoglobin appears to be stabilizing.  She has a JP in place in her right groin which put out 400 cc yesterday this is significantly decreased to about 150 so far today.  This needs to be removed as soon as possible as she has a prosthetic aortic graft down to her right groin.  [name]

## 2017-11-25 LAB — GLUCOSE, CAPILLARY
Glucose-Capillary: 122 mg/dL — ABNORMAL HIGH (ref 65–99)
Glucose-Capillary: 198 mg/dL — ABNORMAL HIGH (ref 65–99)
Glucose-Capillary: 229 mg/dL — ABNORMAL HIGH (ref 65–99)
Glucose-Capillary: 58 mg/dL — ABNORMAL LOW (ref 65–99)
Glucose-Capillary: 69 mg/dL (ref 65–99)
Glucose-Capillary: 73 mg/dL (ref 65–99)
Glucose-Capillary: 93 mg/dL (ref 65–99)

## 2017-11-25 LAB — CBC
HCT: 27.8 % — ABNORMAL LOW (ref 36.0–46.0)
HCT: 28.5 % — ABNORMAL LOW (ref 36.0–46.0)
HEMATOCRIT: 25.7 % — AB (ref 36.0–46.0)
HEMOGLOBIN: 9.4 g/dL — AB (ref 12.0–15.0)
Hemoglobin: 8.9 g/dL — ABNORMAL LOW (ref 12.0–15.0)
Hemoglobin: 9.7 g/dL — ABNORMAL LOW (ref 12.0–15.0)
MCH: 29.7 pg (ref 26.0–34.0)
MCH: 30.4 pg (ref 26.0–34.0)
MCH: 30.7 pg (ref 26.0–34.0)
MCHC: 33.8 g/dL (ref 30.0–36.0)
MCHC: 34 g/dL (ref 30.0–36.0)
MCHC: 34.6 g/dL (ref 30.0–36.0)
MCV: 88 fL (ref 78.0–100.0)
MCV: 89.3 fL (ref 78.0–100.0)
MCV: 88.6 fL (ref 78.0–100.0)
PLATELETS: 176 10*3/uL (ref 150–400)
PLATELETS: 188 10*3/uL (ref 150–400)
PLATELETS: 190 10*3/uL (ref 150–400)
RBC: 3.16 MIL/uL — ABNORMAL LOW (ref 3.87–5.11)
RBC: 3.19 MIL/uL — AB (ref 3.87–5.11)
RBC: 2.9 MIL/uL — ABNORMAL LOW (ref 3.87–5.11)
RDW: 15.2 % (ref 11.5–15.5)
RDW: 15.3 % (ref 11.5–15.5)
RDW: 15.4 % (ref 11.5–15.5)
WBC: 13 10*3/uL — AB (ref 4.0–10.5)
WBC: 13.9 10*3/uL — ABNORMAL HIGH (ref 4.0–10.5)
WBC: 14.7 10*3/uL — AB (ref 4.0–10.5)

## 2017-11-25 LAB — BASIC METABOLIC PANEL
Anion gap: 8 (ref 5–15)
BUN: 38 mg/dL — AB (ref 6–20)
CALCIUM: 7.6 mg/dL — AB (ref 8.9–10.3)
CHLORIDE: 109 mmol/L (ref 101–111)
CO2: 20 mmol/L — ABNORMAL LOW (ref 22–32)
CREATININE: 2.52 mg/dL — AB (ref 0.44–1.00)
GFR, EST AFRICAN AMERICAN: 21 mL/min — AB (ref >60)
GFR, EST NON AFRICAN AMERICAN: 18 mL/min — AB (ref >60)
Glucose, Bld: 78 mg/dL (ref 65–99)
Potassium: 4.7 mmol/L (ref 3.5–5.1)
SODIUM: 137 mmol/L (ref 135–145)

## 2017-11-25 LAB — MAGNESIUM: MAGNESIUM: 2.3 mg/dL (ref 1.7–2.4)

## 2017-11-25 MED ORDER — DEXTROSE 50 % IV SOLN
INTRAVENOUS | Status: AC
  Administered 2017-11-25: 50 mL via INTRAVENOUS
  Filled 2017-11-25: qty 50

## 2017-11-25 MED ORDER — DEXTROSE 50 % IV SOLN
50.0000 mL | INTRAVENOUS | Status: DC | PRN
  Administered 2017-11-25: 50 mL via INTRAVENOUS

## 2017-11-25 NOTE — Evaluation (Signed)
Clinical/Bedside Swallow Evaluation Patient Details  Name: [name] MRN: [id number] Date of Birth: [birth year]  Today's Date: 11/25/2017 Time: SLP Start Time (ACUTE ONLY): 1359 SLP Stop Time (ACUTE ONLY): 1420 SLP Time Calculation (min) (ACUTE ONLY): 21 min  Past Medical History:  Past Medical History:  Diagnosis Date  . Anemia    hx  . Anxiety   . Carotid artery occlusion   . CHF (congestive heart failure) (HCC)   . Constipation   . Coronary artery disease    a. s/p NSTEMI 02/2009 - PCI LCX with Xience DES. Otherwise branch vessel and Dist. RCA dzs. NL EF.  b. 2013: cath showing patency of LCx stent and mild nonobstructive dz in LAD; normal LV function  . Diabetic neuropathy with neurologic complication (HCC)   . Fall from slipping on wet surface Jan. 7, 2014   Right stump bleeding from fall  . GERD (gastroesophageal reflux disease)   . Goiter   . Hernia, umbilical   . History of blood transfusion   . History of right below knee amputation (HCC)   . Hyperlipidemia    Mixed  . Hypertension    Unspecified  . Kidney stones   . Loose, teeth    has loose bridge and two loose teeth holding it  . Myocardial infarction (HCC)   . Peripheral vascular disease (HCC)    unspecified, a. s/p L CEA b. s/p B fem-pop bypass c. s/p R BKA  . PONV (postoperative nausea and vomiting)   . Proliferative diabetic retinopathy of right eye (HCC)   . Raynaud disease   . Stones in the urinary tract   . Subclavian steal syndrome   . Tobacco abuse    Remote  . Type II diabetes mellitus (HCC)   . UTI (lower urinary tract infection)   . UTI (urinary tract infection)   . Vitamin D deficiency    Past Surgical History:  Past Surgical History:  Procedure Laterality Date  . ABDOMINAL ANGIOGRAM  09/21/2011   Procedure: ABDOMINAL ANGIOGRAM;  Surgeon: Nada Libman, MD;  Location: [facility] CATH LAB;  Service: Cardiovascular;;  . ABDOMINAL AORTAGRAM N/A 05/17/2011   Procedure: ABDOMINAL Ronny Flurry;   Surgeon: Nada Libman, MD;  Location: [facility] CATH LAB;  Service: Cardiovascular;  Laterality: N/A;  . ABDOMINAL AORTAGRAM N/A 01/17/2012   Procedure: ABDOMINAL Ronny Flurry;  Surgeon: Nada Libman, MD;  Location: [facility] CATH LAB;  Service: Cardiovascular;  Laterality: N/A;  . ABDOMINAL HYSTERECTOMY     complete  . AMPUTATION  04/23/2012   Procedure: AMPUTATION BELOW KNEE;  Surgeon: Larina Earthly, MD;  Location: [facility] OR;  Service: Vascular;  Laterality: Right;  . AORTA - BILATERAL FEMORAL ARTERY BYPASS GRAFT  03/26/2012   Procedure: AORTA BIFEMORAL BYPASS GRAFT;  Surgeon: Larina Earthly, MD;  Location: [facility] OR;  Service: Vascular;  Laterality: N/A;  Aortic-bifemoral bypass using 14x48mm Hemashield graft .   Marland Kitchen BREAST LUMPECTOMY     right  . CAROTID ENDARTERECTOMY  ~ 2005   left  . COLONOSCOPY  11/2010  . CORONARY ANGIOPLASTY WITH STENT PLACEMENT    . EYE SURGERY Right Feb. 9, 2015   Cataract  . EYE SURGERY Left Feb. 16, 2015   Cataract  . FEMORAL ARTERY - POPLITEAL ARTERY BYPASS GRAFT     left  . FEMORAL ARTERY EXPLORATION  03/26/2012   Procedure: FEMORAL ARTERY EXPLORATION;  Surgeon: Larina Earthly, MD;  Location: [facility] OR;  Service: Vascular;  Laterality: Right;  with Revision of Popliteal-Peroneal  bypass graft using 6mm x 10cm thin wall goretex graft  . FEMORAL ARTERY EXPLORATION Right 11/25/2017   Procedure: RIGHT GROIN EXPLORATION FOR BLEEDING, Repair Puncture in Right Femoral Graft.;  Surgeon: Larina Earthly, MD;  Location: [facility] OR;  Service: Vascular;  Laterality: Right;  . FEMORAL-POPLITEAL BYPASS GRAFT  08/22/2011   Procedure: BYPASS GRAFT FEMORAL-POPLITEAL ARTERY;  Surgeon: Gretta Began, MD;  Location: [facility] OR;  Service: Vascular;  Laterality: Right;  Thrombectomy and Revision using 7mm x 10cm stretch goretex graft  . FEMORAL-TIBIAL BYPASS GRAFT Left 08/19/2015   Procedure: LEFT FEMORAL-  ABOVE KNEE POPLITEAL BYPASS GRAFT (VEIN) to BELOW KNEE POPLITEAL ARTERY BYPASS WITH SMALL SAPHENOUS VEIN GRAFT;  Surgeon:  Larina Earthly, MD;  Location: [facility] OR;  Service: Vascular;  Laterality: Left;  . HERNIA REPAIR    . INTRAOPERATIVE ARTERIOGRAM  08/22/2011   Procedure: INTRA OPERATIVE ARTERIOGRAM;  Surgeon: Gretta Began, MD;  Location: [facility] OR;  Service: Vascular;  Laterality: Right;  to lower leg  . LEFT HEART CATHETERIZATION WITH CORONARY ANGIOGRAM N/A 04/05/2012   Procedure: LEFT HEART CATHETERIZATION WITH CORONARY ANGIOGRAM;  Surgeon: Tonny Bollman, MD;  Location: [facility] CATH LAB;  Service: Cardiovascular;  Laterality: N/A;  . LOWER EXTREMITY ANGIOGRAM  05/17/2011   Procedure: LOWER EXTREMITY ANGIOGRAM;  Surgeon: Nada Libman, MD;  Location: [facility] CATH LAB;  Service: Cardiovascular;;  . LOWER EXTREMITY ANGIOGRAM N/A 09/21/2011   Procedure: LOWER EXTREMITY ANGIOGRAM;  Surgeon: Nada Libman, MD;  Location: [facility] CATH LAB;  Service: Cardiovascular;  Laterality: N/A;  . LOWER EXTREMITY ANGIOGRAPHY N/A 11/04/2017   Procedure: LOWER EXTREMITY ANGIOGRAPHY;  Surgeon: Nada Libman, MD;  Location: [facility] INVASIVE CV LAB;  Service: Cardiovascular;  Laterality: N/A;  . MULTIPLE TOOTH EXTRACTIONS  08-29-2011   5 teeth extracted   . pci  01/17/12   RLE  . PERCUTANEOUS STENT INTERVENTION Right 05/17/2011   Procedure: PERCUTANEOUS STENT INTERVENTION;  Surgeon: Nada Libman, MD;  Location: [facility] CATH LAB;  Service: Cardiovascular;  Laterality: Right;  . PERIPHERAL VASCULAR BALLOON ANGIOPLASTY Left 11/07/2017   Procedure: PERIPHERAL VASCULAR BALLOON ANGIOPLASTY;  Surgeon: Nada Libman, MD;  Location: [facility] INVASIVE CV LAB;  Service: Cardiovascular;  Laterality: Left;  peroneal  . PERIPHERAL VASCULAR CATHETERIZATION N/A 06/08/2015   Procedure: Abdominal Aortogram w/Lower Extremity;  Surgeon: Sherren Kerns, MD;  Location: [facility] INVASIVE CV LAB;  Service: Cardiovascular;  Laterality: N/A;  . PERIPHERAL VASCULAR INTERVENTION  11/06/2017  . PERIPHERAL VASCULAR INTERVENTION Left 11/14/2017   Procedure: PERIPHERAL VASCULAR INTERVENTION;   Surgeon: Nada Libman, MD;  Location: [facility] INVASIVE CV LAB;  Service: Cardiovascular;  Laterality: Left;  popliteal  . TONSILLECTOMY AND ADENOIDECTOMY    . UMBILICAL HERNIA REPAIR  03/26/2012   Procedure: HERNIA REPAIR UMBILICAL ADULT;  Surgeon: Larina Earthly, MD;  Location: [facility] OR;  Service: Vascular;  Laterality: N/A;  Removal of Umbilical hernia sac  . UPPER GASTROINTESTINAL ENDOSCOPY  11/2010  . VESICOVAGINAL FISTULA CLOSURE W/ TAH     HPI:  72 year old woman who underwent left superficial femoral and profundofemoral arterial angiography, angioplasty and then stent placement 5/21.  Course complicated by pseudoaneurysm, acute hemorrhage and associated hemorrhagic shock.  She developed PEA and required brief resuscitation.  She required volume resuscitation, pressors.  She went back to the operating room for exploration and a small defect and her graft was repaired.  She was able to be extubated 5/22. Low-dose morphine PCA for pain. Swallow evaluation ordered due to RN concerns for safety with  POs due to decreased alertness.   Assessment / Plan / Recommendation Clinical Impression   Pt presents with mild-moderate aspiration risk primarily due to lethargy, mentation. Speech is slurred however clears somewhat with oral care and moistening of oral cavity. Following commands; voice is clear but low intensity, and she requires assistance to maneuver around HeliOx cannula for acceptance of straw, spoon. No overt signs of aspiration with consecutive straw sips, vital signs stable. Pt accepts small bite of puree; mildly decreased labial seal and prolonged oral transit however clearance is adequate. Pt declined further solids. Per RN she has been tolerated meds with liquids. Recommend dys 1, thin liquids, meds whole with liquid, full supervision to ensure she is alert and attentive to PO. Will follow up for tolerance, advancement likely as her mentation improves.     SLP Visit Diagnosis: Dysphagia,  unspecified (R13.10)    Aspiration Risk  Mild aspiration risk;Moderate aspiration risk    Diet Recommendation Dysphagia 1 (Puree);Thin liquid   Liquid Administration via: Straw Medication Administration: Whole meds with liquid Supervision: Full supervision/cueing for compensatory strategies Compensations: Slow rate;Small sips/bites;Minimize environmental distractions(Only when alert) Postural Changes: Seated upright at 90 degrees    Other  Recommendations Oral Care Recommendations: Oral care BID   Follow up Recommendations Other (comment)(tba)      Frequency and Duration min 2x/week          Prognosis Prognosis for Safe Diet Advancement: Good      Swallow Study   General Date of Onset: 11/26/2017 HPI: 72 year old woman who underwent left superficial femoral and profundofemoral arterial angiography, angioplasty and then stent placement 5/21.  Course complicated by pseudoaneurysm, acute hemorrhage and associated hemorrhagic shock.  She developed PEA and required brief resuscitation.  She required volume resuscitation, pressors.  She went back to the operating room for exploration and a small defect and her graft was repaired.  She was able to be extubated 5/22. Low-dose morphine PCA for pain. Swallow evaluation ordered due to RN concerns for safety with POs due to decreased alertness. Type of Study: Bedside Swallow Evaluation Previous Swallow Assessment: none in chart Diet Prior to this Study: NPO Temperature Spikes Noted: Yes(slight 99.3) Respiratory Status: Nasal cannula History of Recent Intubation: Yes Length of Intubations (days): 1 days Date extubated: 11/20/2017 Behavior/Cognition: Lethargic/Drowsy;Cooperative Oral Cavity Assessment: Dry;Dried secretions Oral Care Completed by SLP: Yes Oral Cavity - Dentition: Missing dentition(top edentulous) Vision: Functional for self-feeding Self-Feeding Abilities: Needs assist Patient Positioning: Upright in bed Baseline Vocal  Quality: Low vocal intensity Volitional Cough: Strong Volitional Swallow: Able to elicit    Oral/Motor/Sensory Function Overall Oral Motor/Sensory Function: Generalized oral weakness Lingual ROM: Reduced right;Reduced left   Ice Chips Ice chips: Within functional limits   Thin Liquid Thin Liquid: Within functional limits Presentation: Spoon;Straw    Nectar Thick Nectar Thick Liquid: Not tested   Honey Thick Honey Thick Liquid: Not tested   Puree Puree: Impaired Presentation: Spoon Oral Phase Impairments: Impaired mastication;Reduced labial seal Oral Phase Functional Implications: Prolonged oral transit   Solid   GO   Solid: Not tested Other Comments: (pt declined)       Rondel Baton, MS, CCC-SLP Speech-Language Pathologist [phone]  Arlana Lindau 11/25/2017,2:30 PM

## 2017-11-25 NOTE — Progress Notes (Addendum)
   Daily Progress Note   Assessment/Planning:   POD #4 s/p B ABF limb cannulation, L SFA PTA+S, L peroneal and TPT PTA , POD #3 s/p repear of R femoral ABF limb cannulation, evac of hematoma   Pt reports feeling better  RN raise concerns with swallow problems.  Speech consult ordered.  Will adjust diet depending on findings.  Discussed with RN her concerns with transferring her to stepdown given no good way to monitor BP  Stable H/H  Somewhat better Cr.  UOP remains adequate, 3.1 L  Reportedly recurrent hypoglycemia hence D5 1/2 NS MIVF.  Will also hold Lantus for now until diet resumed.  Keep in ICU for now   Subjective  - 3 Days Post-Op   Still some pain in R side, feeling better   Objective   Vitals:   11/25/17 1200 11/25/17 1216 11/25/17 1228 11/25/17 1300  BP: (!) 50/41   (!) 62/49  Pulse: 94   89  Resp: 17 (!) 21  15  Temp:   99.3 F (37.4 C)   TempSrc:   Oral   SpO2: 100% 98%  100%  Weight:      Height:         Intake/Output Summary (Last 24 hours) at 11/25/2017 1315 Last data filed at 11/25/2017 1300 Gross per 24 hour  Intake 1680 ml  Output 3125 ml  Net -1445 ml    PULM  CTAB  CV  RRR  GI  Soft, TTP RLQ, +echymosis, fullness in RLQ: no evidence of skin compromise, unclear if hematoma vs habitus  VASC All limbs unchanged per nursing, multiple ischemic digits with partial toe and finger amputation and R leg amputation  NEURO Somewhat garble speech but reported baseline per family    Laboratory   CBC CBC Latest Ref Rng & Units 11/25/2017 11/25/2017 11/25/2017  WBC 4.0 - 10.5 K/uL 13.0(H) 13.9(H) 14.7(H)  Hemoglobin 12.0 - 15.0 g/dL 9.6(E) 4.5(W) 0.9(W)  Hematocrit 36.0 - 46.0 % 25.7(L) 27.8(L) 28.5(L)  Platelets 150 - 400 K/uL 188 190 176    BMET    Component Value Date/Time   NA 137 11/25/2017 0413   K 4.7 11/25/2017 0413   CL 109 11/25/2017 0413   CO2 20 (L) 11/25/2017 0413   GLUCOSE 78 11/25/2017 0413   BUN 38 (H) 11/25/2017 0413   CREATININE 2.52 (H) 11/25/2017 0413   CALCIUM 7.6 (L) 11/25/2017 0413   GFRNONAA 18 (L) 11/25/2017 0413   GFRAA 21 (L) 11/25/2017 0413     Leonides Sake, MD, FACS [facility] [place] Office: [phone] Pager: [phone]  11/25/2017, 1:15 PM

## 2017-11-26 LAB — BPAM RBC
BLOOD PRODUCT EXPIRATION DATE: 201906012359
BLOOD PRODUCT EXPIRATION DATE: 201906122359
BLOOD PRODUCT EXPIRATION DATE: 201906172359
BLOOD PRODUCT EXPIRATION DATE: 201906172359
BLOOD PRODUCT EXPIRATION DATE: 201906182359
Blood Product Expiration Date: 201906022359
Blood Product Expiration Date: 201906122359
Blood Product Expiration Date: 201906172359
ISSUE DATE / TIME: 201905221239
ISSUE DATE / TIME: 201905221248
ISSUE DATE / TIME: 201905221239
ISSUE DATE / TIME: 201905221248
UNIT TYPE AND RH: 5100
UNIT TYPE AND RH: 5100
UNIT TYPE AND RH: 5100
Unit Type and Rh: 5100
Unit Type and Rh: 9500
Unit Type and Rh: 9500
Unit Type and Rh: 9500
Unit Type and Rh: 9500

## 2017-11-26 LAB — TYPE AND SCREEN
ABO/RH(D): O POS
Antibody Screen: NEGATIVE
UNIT DIVISION: 0
UNIT DIVISION: 0
UNIT DIVISION: 0
Unit division: 0
Unit division: 0
Unit division: 0
Unit division: 0
Unit division: 0

## 2017-11-26 LAB — GLUCOSE, CAPILLARY
GLUCOSE-CAPILLARY: 101 mg/dL — AB (ref 65–99)
GLUCOSE-CAPILLARY: 127 mg/dL — AB (ref 65–99)
GLUCOSE-CAPILLARY: 130 mg/dL — AB (ref 65–99)
GLUCOSE-CAPILLARY: 143 mg/dL — AB (ref 65–99)
GLUCOSE-CAPILLARY: 214 mg/dL — AB (ref 65–99)
Glucose-Capillary: 128 mg/dL — ABNORMAL HIGH (ref 65–99)
Glucose-Capillary: 177 mg/dL — ABNORMAL HIGH (ref 65–99)

## 2017-11-26 MED ORDER — PANTOPRAZOLE SODIUM 40 MG PO TBEC
40.0000 mg | DELAYED_RELEASE_TABLET | Freq: Every day | ORAL | Status: DC
  Administered 2017-11-26: 40 mg via ORAL
  Filled 2017-11-26: qty 1

## 2017-11-26 MED ORDER — GLUCERNA SHAKE PO LIQD
237.0000 mL | Freq: Three times a day (TID) | ORAL | Status: DC
  Administered 2017-11-26 – 2017-11-30 (×4): 237 mL via ORAL

## 2017-11-26 NOTE — Progress Notes (Signed)
   Daily Progress Note   Assessment/Planning:    POD #5 s/p B ABF limb cannulation, L SFA PTA+S, L peroneal and TPT PTA , POD #4 s/p repear of R femoral ABF limb cannulation, evac of hematoma    Neuro: mental status intact, some garbled speech per family  Discussed possible CVA work-up: husband prefers to defer for now given transfer from bed to CT table CT likely needed  keep PCA per pt's preference  Pulm:   IS x 10 q1 hour,   Wean [place] oxygen as tolerated  CV:  Unable to measure accurate BP, using clinical surrogates (Mental status and urine output)  GI:  Pt needs to eat.  Ok to bring food into hospital  May need Megace.  Family will try to encourage eating  Once PO better can stop D5 1/2 NS  Husband requesting Dexilant: Protonix ordered (therapeutic substitution)  FEN:  NS @10  cc/hr for PCA  D5 1/2 NS @ 50 cc/hr to maintain BG  Ensure  RENAL:  UOP >3 L over last two days  D/C foley: husband and pt ok with such  HEME:  Stable H/H 8.9/25.7 (9.4/27.8), suspect this is hemodilution  VASC:  R groin/RLQ fullness unchanged so probably residual clot/seroma  JP still 270 cc/day  Would prefer to pull soon as possible due to proximity to ABF limb but volume still too high.  Hopefully tomorrow.  L Peroneal still dopplerable    Subjective  - 4 Days Post-Op   No events overnight, still having pain in RLQ   Objective   Vitals:   11/26/17 0600 11/26/17 0700 11/26/17 0800 11/26/17 0900  BP: (!) 80/54 (!) 65/42 (!) 78/55 (!) 71/51  Pulse: 93 92 99 (!) 107  Resp: 18 20 (!) 23 (!) 24  Temp:  99.4 F (37.4 C)    TempSrc:  Oral    SpO2: 100% 100% 100% 100%  Weight:      Height:         Intake/Output Summary (Last 24 hours) at 11/26/2017 1044 Last data filed at 11/26/2017 0900 Gross per 24 hour  Intake 1770 ml  Output 3420 ml  Net -1650 ml    PULM  BLL rales, [place] oxygen  CV  RR, tachycardiac  GI  Soft, RLQ and R thigh echymotic, no change in  fullness in RLQ/thigh, JP 270 cc serosang  VASC L groin: no hematoma, L foot gangrenous toes unchanged, dopplerable peroneal artery  NEURO Speech more fluent today but still some word findings     Laboratory   CBC CBC Latest Ref Rng & Units 11/25/2017 11/25/2017 11/25/2017  WBC 4.0 - 10.5 K/uL 13.0(H) 13.9(H) 14.7(H)  Hemoglobin 12.0 - 15.0 g/dL 2.7(N) 1.7(G) 0.1(V)  Hematocrit 36.0 - 46.0 % 25.7(L) 27.8(L) 28.5(L)  Platelets 150 - 400 K/uL 188 190 176    BMET    Component Value Date/Time   NA 137 11/25/2017 0413   K 4.7 11/25/2017 0413   CL 109 11/25/2017 0413   CO2 20 (L) 11/25/2017 0413   GLUCOSE 78 11/25/2017 0413   BUN 38 (H) 11/25/2017 0413   CREATININE 2.52 (H) 11/25/2017 0413   CALCIUM 7.6 (L) 11/25/2017 0413   GFRNONAA 18 (L) 11/25/2017 0413   GFRAA 21 (L) 11/25/2017 0413     Leonides Sake, MD, FACS [facility] [place] Office: [phone] Pager: [phone]  11/26/2017, 10:44 AM

## 2017-11-27 ENCOUNTER — Inpatient Hospital Stay (HOSPITAL_COMMUNITY): Payer: Medicare Other

## 2017-11-27 LAB — GLUCOSE, CAPILLARY
GLUCOSE-CAPILLARY: 129 mg/dL — AB (ref 65–99)
GLUCOSE-CAPILLARY: 152 mg/dL — AB (ref 65–99)
GLUCOSE-CAPILLARY: 189 mg/dL — AB (ref 65–99)
GLUCOSE-CAPILLARY: 200 mg/dL — AB (ref 65–99)
Glucose-Capillary: 96 mg/dL (ref 65–99)
Glucose-Capillary: 180 mg/dL — ABNORMAL HIGH (ref 65–99)
Glucose-Capillary: 131 mg/dL — ABNORMAL HIGH (ref 65–99)

## 2017-11-27 LAB — CBC
HCT: 28.2 % — ABNORMAL LOW (ref 36.0–46.0)
HCT: 26.4 % — ABNORMAL LOW (ref 36.0–46.0)
Hemoglobin: 9.3 g/dL — ABNORMAL LOW (ref 12.0–15.0)
Hemoglobin: 8.6 g/dL — ABNORMAL LOW (ref 12.0–15.0)
MCH: 29.8 pg (ref 26.0–34.0)
MCH: 29.4 pg (ref 26.0–34.0)
MCHC: 33 g/dL (ref 30.0–36.0)
MCHC: 32.6 g/dL (ref 30.0–36.0)
MCV: 90.4 fL (ref 78.0–100.0)
MCV: 90.1 fL (ref 78.0–100.0)
PLATELETS: 280 10*3/uL (ref 150–400)
Platelets: 287 10*3/uL (ref 150–400)
RBC: 3.12 MIL/uL — ABNORMAL LOW (ref 3.87–5.11)
RBC: 2.93 MIL/uL — ABNORMAL LOW (ref 3.87–5.11)
RDW: 15.1 % (ref 11.5–15.5)
RDW: 15.1 % (ref 11.5–15.5)
WBC: 9.4 10*3/uL (ref 4.0–10.5)
WBC: 11 10*3/uL — AB (ref 4.0–10.5)

## 2017-11-27 LAB — HEMOGLOBIN AND HEMATOCRIT, BLOOD
HEMATOCRIT: 30.9 % — AB (ref 36.0–46.0)
HEMOGLOBIN: 10.3 g/dL — AB (ref 12.0–15.0)

## 2017-11-27 LAB — BASIC METABOLIC PANEL
Anion gap: 10 (ref 5–15)
BUN: 31 mg/dL — ABNORMAL HIGH (ref 6–20)
CHLORIDE: 105 mmol/L (ref 101–111)
CO2: 25 mmol/L (ref 22–32)
Calcium: 7.9 mg/dL — ABNORMAL LOW (ref 8.9–10.3)
Creatinine, Ser: 1.48 mg/dL — ABNORMAL HIGH (ref 0.44–1.00)
GFR calc non Af Amer: 34 mL/min — ABNORMAL LOW (ref >60)
GFR, EST AFRICAN AMERICAN: 40 mL/min — AB (ref >60)
Glucose, Bld: 168 mg/dL — ABNORMAL HIGH (ref 65–99)
POTASSIUM: 3.3 mmol/L — AB (ref 3.5–5.1)
Sodium: 140 mmol/L (ref 135–145)

## 2017-11-27 MED ORDER — LORAZEPAM 2 MG/ML IJ SOLN
0.5000 mg | Freq: Four times a day (QID) | INTRAMUSCULAR | Status: DC | PRN
  Administered 2017-12-10 – 2017-12-12 (×2): 0.5 mg via INTRAVENOUS
  Filled 2017-11-27 (×2): qty 1

## 2017-11-27 MED ORDER — BISACODYL 10 MG RE SUPP
10.0000 mg | Freq: Every day | RECTAL | Status: DC | PRN
  Administered 2017-11-28: 10 mg via RECTAL
  Filled 2017-11-27: qty 1

## 2017-11-27 MED ORDER — PANTOPRAZOLE SODIUM 40 MG PO TBEC: 40.0000 mg | DELAYED_RELEASE_TABLET | Freq: Every day | ORAL | Status: DC

## 2017-11-27 MED ORDER — NON FORMULARY: 80.0000 mg | Freq: Once | Status: DC

## 2017-11-27 MED ORDER — PANTOPRAZOLE SODIUM 40 MG IV SOLR
40.0000 mg | Freq: Two times a day (BID) | INTRAVENOUS | Status: DC
  Administered 2017-11-30 – 2017-12-12 (×25): 40 mg via INTRAVENOUS
  Filled 2017-11-27 (×27): qty 40

## 2017-11-27 MED ORDER — SODIUM CHLORIDE 0.9 % IV SOLN
8.0000 mg/h | INTRAVENOUS | Status: AC
  Administered 2017-11-27 – 2017-11-30 (×7): 8 mg/h via INTRAVENOUS
  Filled 2017-11-27 (×11): qty 80

## 2017-11-27 MED ORDER — PANTOPRAZOLE SODIUM 40 MG PO TBEC: 80.0000 mg | DELAYED_RELEASE_TABLET | Freq: Two times a day (BID) | ORAL | Status: DC

## 2017-11-27 MED ORDER — SODIUM CHLORIDE 0.9 % IV SOLN
80.0000 mg | Freq: Once | INTRAVENOUS | Status: AC
  Administered 2017-11-27: 80 mg via INTRAVENOUS
  Filled 2017-11-27: qty 80

## 2017-11-27 MED ORDER — FLEET ENEMA 7-19 GM/118ML RE ENEM
1.0000 | ENEMA | Freq: Every day | RECTAL | Status: DC | PRN
  Filled 2017-11-27: qty 1

## 2017-11-27 MED ORDER — METOPROLOL TARTRATE 5 MG/5ML IV SOLN
5.0000 mg | Freq: Four times a day (QID) | INTRAVENOUS | Status: DC
  Administered 2017-11-27 – 2017-12-11 (×53): 5 mg via INTRAVENOUS
  Filled 2017-11-27 (×54): qty 5

## 2017-11-27 MED ORDER — ORAL CARE MOUTH RINSE
15.0000 mL | Freq: Two times a day (BID) | OROMUCOSAL | Status: DC
  Administered 2017-11-27 – 2017-12-11 (×17): 15 mL via OROMUCOSAL

## 2017-11-27 MED ORDER — FUROSEMIDE 10 MG/ML IJ SOLN
20.0000 mg | Freq: Two times a day (BID) | INTRAMUSCULAR | Status: DC
  Administered 2017-11-27 – 2017-12-11 (×28): 20 mg via INTRAVENOUS
  Filled 2017-11-27 (×30): qty 2

## 2017-11-27 NOTE — Progress Notes (Signed)
SLP Cancellation Note  Patient Details Name: [name] MRN: [id number] DOB: 07/31/1945   Cancelled treatment:        Pt with coffee ground emesis and GI MD dx'd coffee grounds likely due to erosive esophagitis and would manage conservatively. She is now NPO. Will continue efforts.     [name] 11/27/2017, 2:52 PM  [name].Ed [employer] [phone]

## 2017-11-27 NOTE — Progress Notes (Addendum)
Progress Note    11/27/2017 7:38 AM 5 Days Post-Op  Subjective:  Patient states she feels better overall and her pain level is improved   Vitals:   11/27/17 0400 11/27/17 0446  BP:    Pulse: (!) 118   Resp: (!) 24 (!) 25  Temp: 98 F (36.7 C)   SpO2: 100% 100%   Physical Exam: Lungs:  Non labored on [place] Incisions:  R groin incision stable; some fullness and tenderness with palpation; 70cc overnight serosanguinous drainage in JP Extremities:  Unable to doppler L peroneal signal; mild pitting edema LLE to level of mid shin; dry gangrene toes of L foot Abdomen:  Soft, RLQ tender to deep palpation Neurologic: alert and able to hold conversation this morning  CBC    Component Value Date/Time   WBC 9.4 11/27/2017 0443   RBC 3.12 (L) 11/27/2017 0443   HGB 9.3 (L) 11/27/2017 0443   HCT 28.2 (L) 11/27/2017 0443   HCT 27.2 (L) 12/14/2015 0445   PLT 280 11/27/2017 0443   MCV 90.4 11/27/2017 0443   MCH 29.8 11/27/2017 0443   MCHC 33.0 11/27/2017 0443   RDW 15.1 11/27/2017 0443   LYMPHSABS 1.1 12/11/2015 0428   MONOABS 1.0 12/11/2015 0428   EOSABS 0.1 12/11/2015 0428   BASOSABS 0.0 12/11/2015 0428    BMET    Component Value Date/Time   NA 140 11/27/2017 0443   K 3.3 (L) 11/27/2017 0443   CL 105 11/27/2017 0443   CO2 25 11/27/2017 0443   GLUCOSE 168 (H) 11/27/2017 0443   BUN 31 (H) 11/27/2017 0443   CREATININE 1.48 (H) 11/27/2017 0443   CALCIUM 7.9 (L) 11/27/2017 0443   GFRNONAA 34 (L) 11/27/2017 0443   GFRAA 40 (L) 11/27/2017 0443    INR    Component Value Date/Time   INR 1.60 (H) 12/11/2015 0930     Intake/Output Summary (Last 24 hours) at 11/27/2017 0738 Last data filed at 11/27/2017 0700 Gross per 24 hour  Intake 1805.95 ml  Output 1960 ml  Net -154.05 ml     Assessment/Plan:  72 y.o. female is s/p B ABF limb cannulation, L SFA PTA+S, L peroneal and TPT PTA, POD #4 s/p repair of R femoral ABF limb cannulation, evac of hematoma  5 Days Post-Op   D/c  JP drain R groin Unable to doppler L peroneal on exam this morning, patient however denies changes to LLE including sensation and pain; continue to monitor GI consulted due to coffee ground emesis Continue d5 1/2 NS at 50cc/hr Continue to monitor UO Hgb9.3 this morning; more recommendations pending GI evaluation   Emilie Rutter, PA-C [facility] [phone] 11/27/2017 7:38 AM   Addendum  I have independently interviewed and examined the patient, and I agree with the physician assistant's findings.  +peroneal signal on exam.  Tight abd with gas.  No BM this AM.  RLQ and thigh still echymotic.  Cr improved 2.52 to 1.48.  Foley out.  H/H stable at 8.6/26.4.  JP taken out today as output finally low enough.   Appreciate GI seeing pt: erosive esophagitis, on Protonix drip this AM  Check KUB  Switch PO rx per GI rec.  Pt needs ASA for stent but concerns for poor PO tolerance and erosive esophagitis  Leonides Sake, MD, FACS [facility] [place] Office: [phone] Pager: [phone]  11/27/2017, 1:46 PM  Addendum  Radiology     Dg Abd Portable 1v  Result Date: 11/27/2017 CLINICAL  DATA:  Nausea, vomiting. EXAM: PORTABLE ABDOMEN - 1 VIEW COMPARISON:  Radiograph of December 09, 2015. FINDINGS: Large amount of stool seen throughout the colon. Atherosclerosis of abdominal aorta is noted. Mild small bowel dilatation is seen in the right side of the abdomen which may represent ileus or possibly distal small bowel obstruction. IMPRESSION: Large stool burden is noted. Mild small bowel dilatation is noted which may represent ileus or possibly distal small bowel obstruction. Aortic Atherosclerosis (ICD10-I70.0). Electronically Signed   By: Lupita Raider, M.D.   On: 11/27/2017 15:16    - Pt passing flatus and BM yesterday so she doesn't have SBO. - Added to MAR dulcolax supp and fleet enema prn   Leonides Sake, MD, FACS [facility] [place] Office: [phone] Pager: [phone]  11/27/2017, 5:23 PM

## 2017-11-27 NOTE — Progress Notes (Signed)
Pt began having large amounts of coffee ground, dark brown emesis. Zofran given but ineffective because pt still remained nauseous and continued to vomit. Paged MD and orders received for 80mg  IV protonix. H&H drawn and MD ordered to transfuse 2 RBC if HCT was less than 26.  Hemoglobin was 10.3, HCT 30.9 so RBCs not transfused. Will recheck labs in am.  Pt gradually becoming less nauseous and remains alert and oriented. Will keep pt NPO at this time. Vitals stable. Will continue to monitor.

## 2017-11-27 NOTE — Consult Note (Signed)
Referring Provider: Dr. Imogene Burn Primary Care Physician:  Adrian Prince, MD Primary Gastroenterologist:  Gentry Fitz  Reason for Consultation:  Coffee grounds emesis; Anemia  HPI: [name] is a 72 y.o. female with multiple medical problems as stated below being seen for a consult due to a large episode of coffee grounds and dark brown emesis early this morning and recurrent smaller episodes of brown vomiting with small coffee grounds in them per nursing. Hgb 9.3 at 4AM today (10.3 at 1AM today and 8.9 on 11/25/17). No known history of ulcers. History of GERD on Dexilant at home.  Past Medical History:  Diagnosis Date  . Anemia    hx  . Anxiety   . Carotid artery occlusion   . CHF (congestive heart failure) (HCC)   . Constipation   . Coronary artery disease    a. s/p NSTEMI 02/2009 - PCI LCX with Xience DES. Otherwise branch vessel and Dist. RCA dzs. NL EF.  b. 2013: cath showing patency of LCx stent and mild nonobstructive dz in LAD; normal LV function  . Diabetic neuropathy with neurologic complication (HCC)   . Fall from slipping on wet surface Jan. 7, 2014   Right stump bleeding from fall  . GERD (gastroesophageal reflux disease)   . Goiter   . Hernia, umbilical   . History of blood transfusion   . History of right below knee amputation (HCC)   . Hyperlipidemia    Mixed  . Hypertension    Unspecified  . Kidney stones   . Loose, teeth    has loose bridge and two loose teeth holding it  . Myocardial infarction (HCC)   . Peripheral vascular disease (HCC)    unspecified, a. s/p L CEA b. s/p B fem-pop bypass c. s/p R BKA  . PONV (postoperative nausea and vomiting)   . Proliferative diabetic retinopathy of right eye (HCC)   . Raynaud disease   . Stones in the urinary tract   . Subclavian steal syndrome   . Tobacco abuse    Remote  . Type II diabetes mellitus (HCC)   . UTI (lower urinary tract infection)   . UTI (urinary tract infection)   . Vitamin D deficiency     Past  Surgical History:  Procedure Laterality Date  . ABDOMINAL ANGIOGRAM  09/21/2011   Procedure: ABDOMINAL ANGIOGRAM;  Surgeon: Nada Libman, MD;  Location: [facility] CATH LAB;  Service: Cardiovascular;;  . ABDOMINAL AORTAGRAM N/A 05/17/2011   Procedure: ABDOMINAL Ronny Flurry;  Surgeon: Nada Libman, MD;  Location: [facility] CATH LAB;  Service: Cardiovascular;  Laterality: N/A;  . ABDOMINAL AORTAGRAM N/A 01/17/2012   Procedure: ABDOMINAL Ronny Flurry;  Surgeon: Nada Libman, MD;  Location: [facility] CATH LAB;  Service: Cardiovascular;  Laterality: N/A;  . ABDOMINAL HYSTERECTOMY     complete  . AMPUTATION  04/23/2012   Procedure: AMPUTATION BELOW KNEE;  Surgeon: Larina Earthly, MD;  Location: [facility] OR;  Service: Vascular;  Laterality: Right;  . AORTA - BILATERAL FEMORAL ARTERY BYPASS GRAFT  03/26/2012   Procedure: AORTA BIFEMORAL BYPASS GRAFT;  Surgeon: Larina Earthly, MD;  Location: [facility] OR;  Service: Vascular;  Laterality: N/A;  Aortic-bifemoral bypass using 14x14mm Hemashield graft .   Marland Kitchen BREAST LUMPECTOMY     right  . CAROTID ENDARTERECTOMY  ~ 2005   left  . COLONOSCOPY  11/2010  . CORONARY ANGIOPLASTY WITH STENT PLACEMENT    . EYE SURGERY Right Feb. 9, 2015   Cataract  . EYE SURGERY Left Feb.  16, 2015   Cataract  . FEMORAL ARTERY - POPLITEAL ARTERY BYPASS GRAFT     left  . FEMORAL ARTERY EXPLORATION  03/26/2012   Procedure: FEMORAL ARTERY EXPLORATION;  Surgeon: Larina Earthly, MD;  Location: [facility] OR;  Service: Vascular;  Laterality: Right;  with Revision of Popliteal-Peroneal bypass graft using 6mm x 10cm thin wall goretex graft  . FEMORAL ARTERY EXPLORATION Right 12-12-2017   Procedure: RIGHT GROIN EXPLORATION FOR BLEEDING, Repair Puncture in Right Femoral Graft.;  Surgeon: Larina Earthly, MD;  Location: [facility] OR;  Service: Vascular;  Laterality: Right;  . FEMORAL-POPLITEAL BYPASS GRAFT  08/22/2011   Procedure: BYPASS GRAFT FEMORAL-POPLITEAL ARTERY;  Surgeon: Gretta Began, MD;  Location: [facility] OR;  Service: Vascular;  Laterality:  Right;  Thrombectomy and Revision using 7mm x 10cm stretch goretex graft  . FEMORAL-TIBIAL BYPASS GRAFT Left 08/19/2015   Procedure: LEFT FEMORAL-  ABOVE KNEE POPLITEAL BYPASS GRAFT (VEIN) to BELOW KNEE POPLITEAL ARTERY BYPASS WITH SMALL SAPHENOUS VEIN GRAFT;  Surgeon: Larina Earthly, MD;  Location: [facility] OR;  Service: Vascular;  Laterality: Left;  . HERNIA REPAIR    . INTRAOPERATIVE ARTERIOGRAM  08/22/2011   Procedure: INTRA OPERATIVE ARTERIOGRAM;  Surgeon: Gretta Began, MD;  Location: [facility] OR;  Service: Vascular;  Laterality: Right;  to lower leg  . LEFT HEART CATHETERIZATION WITH CORONARY ANGIOGRAM N/A 04/05/2012   Procedure: LEFT HEART CATHETERIZATION WITH CORONARY ANGIOGRAM;  Surgeon: Tonny Bollman, MD;  Location: [facility] CATH LAB;  Service: Cardiovascular;  Laterality: N/A;  . LOWER EXTREMITY ANGIOGRAM  05/17/2011   Procedure: LOWER EXTREMITY ANGIOGRAM;  Surgeon: Nada Libman, MD;  Location: [facility] CATH LAB;  Service: Cardiovascular;;  . LOWER EXTREMITY ANGIOGRAM N/A 09/21/2011   Procedure: LOWER EXTREMITY ANGIOGRAM;  Surgeon: Nada Libman, MD;  Location: [facility] CATH LAB;  Service: Cardiovascular;  Laterality: N/A;  . LOWER EXTREMITY ANGIOGRAPHY N/A 12/01/2017   Procedure: LOWER EXTREMITY ANGIOGRAPHY;  Surgeon: Nada Libman, MD;  Location: [facility] INVASIVE CV LAB;  Service: Cardiovascular;  Laterality: N/A;  . MULTIPLE TOOTH EXTRACTIONS  08-29-2011   5 teeth extracted   . pci  01/17/12   RLE  . PERCUTANEOUS STENT INTERVENTION Right 05/17/2011   Procedure: PERCUTANEOUS STENT INTERVENTION;  Surgeon: Nada Libman, MD;  Location: [facility] CATH LAB;  Service: Cardiovascular;  Laterality: Right;  . PERIPHERAL VASCULAR BALLOON ANGIOPLASTY Left 11/20/2017   Procedure: PERIPHERAL VASCULAR BALLOON ANGIOPLASTY;  Surgeon: Nada Libman, MD;  Location: [facility] INVASIVE CV LAB;  Service: Cardiovascular;  Laterality: Left;  peroneal  . PERIPHERAL VASCULAR CATHETERIZATION N/A 06/08/2015   Procedure: Abdominal Aortogram w/Lower  Extremity;  Surgeon: Sherren Kerns, MD;  Location: [facility] INVASIVE CV LAB;  Service: Cardiovascular;  Laterality: N/A;  . PERIPHERAL VASCULAR INTERVENTION  11/13/2017  . PERIPHERAL VASCULAR INTERVENTION Left 11/16/2017   Procedure: PERIPHERAL VASCULAR INTERVENTION;  Surgeon: Nada Libman, MD;  Location: [facility] INVASIVE CV LAB;  Service: Cardiovascular;  Laterality: Left;  popliteal  . TONSILLECTOMY AND ADENOIDECTOMY    . UMBILICAL HERNIA REPAIR  03/26/2012   Procedure: HERNIA REPAIR UMBILICAL ADULT;  Surgeon: Larina Earthly, MD;  Location: [facility] OR;  Service: Vascular;  Laterality: N/A;  Removal of Umbilical hernia sac  . UPPER GASTROINTESTINAL ENDOSCOPY  11/2010  . VESICOVAGINAL FISTULA CLOSURE W/ TAH      Prior to Admission medications   Medication Sig Start Date End Date Taking? Authorizing Provider  ALPRAZolam Prudy Feeler) 0.25 MG tablet Take 0.25 mg by mouth daily as needed for anxiety.  Yes [provider]  aspirin EC 81 MG tablet Take 1 tablet (81 mg total) by mouth daily. Patient taking differently: Take 162 mg by mouth daily. Takes two daily 07/10/15  Yes Tonny Bollman, MD  calcium carbonate (TUMS - DOSED IN MG ELEMENTAL CALCIUM) 500 MG chewable tablet Chew 1,000 mg by mouth daily as needed for indigestion or heartburn.   Yes [provider]  dexlansoprazole (DEXILANT) 60 MG capsule Take 60 mg by mouth 2 (two) times daily.   Yes [provider]  ergocalciferol (VITAMIN D2) 50000 units capsule Take 50,000 Units by mouth 3 (three) times a week.   Yes [provider]  furosemide (LASIX) 40 MG tablet Take 40 mg by mouth 2 (two) times daily.   Yes [provider]  iron polysaccharides (NIFEREX) 150 MG capsule Take 600 mg by mouth daily.    Yes [provider]  isosorbide mononitrate (IMDUR) 30 MG 24 hr tablet Take 30 mg by mouth daily.   Yes [provider]  Lactobacillus (ACIDOPHILUS PO) Take 1 capsule by mouth daily.   Yes [provider]  meperidine (DEMEROL) 50 MG tablet Take 50 mg by mouth every 4 (four) hours as needed for severe pain.   Yes [provider]  metFORMIN (GLUCOPHAGE) 500 MG tablet Take 500 mg by mouth 2 (two) times daily with a meal.    Yes [provider]  metoprolol succinate (TOPROL-XL) 50 MG 24 hr tablet Take 1 tablet (50 mg total) by mouth daily. 12/14/15  Yes Tat, Onalee Hua, MD  omega-3 acid ethyl esters (LOVAZA) 1 G capsule Take 2 g by mouth 2 (two) times daily.    Yes [provider]  prochlorperazine (COMPAZINE) 10 MG tablet Take 10 mg by mouth every 6 (six) hours as needed for nausea or vomiting.   Yes [provider]  ramipril (ALTACE) 1.25 MG capsule Take 1.25 mg by mouth daily.   Yes [provider]  rosuvastatin (CRESTOR) 10 MG tablet Take 10 mg by mouth every Monday.    Yes [provider]  sodium chloride (OCEAN) 0.65 % SOLN nasal spray Place 2-4 sprays into both nostrils every 4 (four) hours as needed (for nose bleeds).   Yes [provider]  sulfamethoxazole-trimethoprim (BACTRIM DS,SEPTRA DS) 800-160 MG tablet Take 1 tablet by mouth 2 (two) times daily.   Yes [provider]  TRESIBA FLEXTOUCH 200 UNIT/ML SOPN Inject 16 Units into the muscle every morning.  10/31/17  Yes [provider]  clopidogrel (PLAVIX) 75 MG tablet Take 1 tablet (75 mg total) by mouth daily. 11/11/2017   Nada Libman, MD  clopidogrel (PLAVIX) 75 MG tablet Take 1 tablet (75 mg total) by mouth daily. 11/29/2017 11/21/18  Nada Libman, MD  nitroGLYCERIN (NITROSTAT) 0.4 MG SL tablet Place 1 tablet (0.4 mg total) under the tongue every 5 (five) minutes as needed. For chest pain 05/16/14   Tonny Bollman, MD    Scheduled Meds: . aspirin EC  81 mg Oral Daily  . Chlorhexidine Gluconate Cloth  6 each Topical Daily  . feeding supplement (GLUCERNA SHAKE)  237 mL Oral TID WC  . furosemide  40 mg Oral BID  . insulin aspart  0-15 Units  Subcutaneous Q4H  . iron polysaccharides  300 mg Oral BID WC  . metoprolol succinate  50 mg Oral Daily  . morphine   Intravenous Q4H  . omega-3 acid ethyl esters  2 g Oral BID  . [START ON  11/30/2017] pantoprazole  40 mg Intravenous Q12H  . rosuvastatin  10 mg Oral Q Mon  . sodium chloride flush  10-40 mL Intracatheter Q12H   Continuous Infusions: . sodium chloride    . sodium chloride    . dextrose 5 % and 0.45% NaCl 50 mL/hr at 11/27/17 0800  . norepinephrine (LEVOPHED) Adult infusion Stopped (11/24/17 2211)  . pantoprazole (PROTONIX) IVPB    . pantoprozole (PROTONIX) infusion     PRN Meds:.sodium chloride, acetaminophen **OR** acetaminophen, ALPRAZolam, calcium carbonate, dextrose, diphenhydrAMINE **OR** diphenhydrAMINE, meperidine, morphine injection, naloxone **AND** sodium chloride flush, nitroGLYCERIN, ondansetron (ZOFRAN) IV, prochlorperazine, sodium chloride, sodium chloride flush  Allergies as of 11/01/2017 - Review Complete 10/31/2017  Allergen Reaction Noted  . Acetaminophen Nausea Only and Other (See Comments) 06/05/2009  . Ceftriaxone Itching 05/14/2014  . Codeine Nausea And Vomiting 03/12/2009  . Erythromycin Nausea And Vomiting 03/12/2009  . Hydrocodone-acetaminophen Nausea And Vomiting 09/20/2011  . Hydromorphone Nausea And Vomiting 02/08/2011  . Propoxyphene hcl Nausea And Vomiting 03/12/2009  . Statins Other (See Comments) 03/12/2009  . Eggs or egg-derived products Other (See Comments) 03/20/2012  . Ibuprofen Other (See Comments) 02/08/2011  . Shellfish-derived products Other (See Comments) 03/20/2012    Family History  Problem Relation Age of Onset  . Heart attack Mother   . Heart attack Father   . Hypertension Brother   . Diabetes Brother   . Hypertension Brother     Social History   Socioeconomic History  . Marital status: Married    Spouse name: Not on file  . Number of children: Not on file  . Years of education: Not on file  . Highest  education level: Not on file  Occupational History  . Not on file  Social Needs  . Financial resource strain: Not on file  . Food insecurity:    Worry: Not on file    Inability: Not on file  . Transportation needs:    Medical: Not on file    Non-medical: Not on file  Tobacco Use  . Smoking status: Former Smoker    Packs/day: 2.00    Years: 39.00    Pack years: 78.00    Types: Cigarettes    Last attempt to quit: 07/05/2001    Years since quitting: 16.4  . Smokeless tobacco: Never Used  . Tobacco comment: Quit 15 years ago  Substance and Sexual Activity  . Alcohol use: Yes    Alcohol/week: 9.0 oz    Types: 1 Shots of liquor, 14 Standard drinks or equivalent per week    Comment: burbon   . Drug use: No  . Sexual activity: Not on file  Lifestyle  . Physical activity:    Days per week: Not on file    Minutes per session: Not on file  . Stress: Not on file  Relationships  . Social connections:    Talks on phone: Not on file    Gets together: Not on file    Attends religious service: Not on file    Active member of club or organization: Not on file    Attends meetings of clubs or organizations: Not on file    Relationship status: Not on file  . Intimate partner violence:    Fear of current or ex partner: Not on file    Emotionally abused: Not on file    Physically abused: Not on file    Forced sexual activity: Not on file  Other Topics Concern  . Not on file  Social History Narrative   Does not get regular exercise    Review of Systems: All negative except as stated above in HPI.  Physical Exam: Vital signs: Vitals:   11/27/17 0833 11/27/17 0900  BP:    Pulse:  (!) 115  Resp: (!) 22 (!) 22  Temp:    SpO2: 100% 100%   Last BM Date: 11/26/17 General:  Lethargic, elderly, frail, mild acute distress Head: normocephalic, atraumatic Eyes: anicteric sclera ENT: oropharynx clear Neck: supple, nontender Lungs:  Clear throughout to auscultation.   No wheezes,  crackles, or rhonchi. No acute distress. Heart:  Regular rate and rhythm; no murmurs, clicks, rubs,  or gallops. Abdomen: diffuse tenderness with guarding, soft, mild distention, +BS  Rectal:  Deferred Ext: LLE edema; right BKA  GI:  Lab Results: Recent Labs    11/25/17 0413 11/25/17 1203 11/27/17 0133 11/27/17 0443  WBC 13.9* 13.0*  --  9.4  HGB 9.4* 8.9* 10.3* 9.3*  HCT 27.8* 25.7* 30.9* 28.2*  PLT 190 188  --  280   BMET Recent Labs    11/25/17 0413 11/27/17 0443  NA 137 140  K 4.7 3.3*  CL 109 105  CO2 20* 25  GLUCOSE 78 168*  BUN 38* 31*  CREATININE 2.52* 1.48*  CALCIUM 7.6* 7.9*   LFT No results for input(s): PROT, ALBUMIN, AST, ALT, ALKPHOS, BILITOT, BILIDIR, IBILI in the last 72 hours. PT/INR No results for input(s): LABPROT, INR in the last 72 hours.   Studies/Results: No results found.  Impression/Plan: 72 yo s/p vascular surgery with postop hematoma vascular surgery seen for coffee grounds emesis without a drop in Hgb. Coffee grounds likely due to erosive esophagitis and would manage conservatively. Change to Protonix drip today and if stable transition to Protonix 40 mg IV Q 12 hours tomorrow. NPO due to recurrent vomiting. I do not think she needs an EGD at this time. Recheck H/H today. Advised nurse to contact Dr. Imogene Burn regarding PO meds since her husband states she cannot take her meds with just sips of water because of nausea. [facility] will f/u tomorrow.    LOS: 5 days   Tamme Mozingo C.  11/27/2017, 9:19 AM  Questions please call [phone]

## 2017-11-27 NOTE — Progress Notes (Signed)
10 mL morphine (2 mg per mL) wasted in sink with Higinio Plan, RN during PCA tubing change.

## 2017-11-27 NOTE — Plan of Care (Signed)
  Problem: Education: Goal: Knowledge of General Education information will improve Outcome: Progressing   Problem: Clinical Measurements: Goal: Ability to maintain clinical measurements within normal limits will improve Outcome: Progressing Goal: Postoperative complications will be avoided or minimized Outcome: Progressing   Problem: Skin Integrity: Goal: Demonstration of wound healing without infection will improve Outcome: Progressing   Problem: Health Behavior/Discharge Planning: Goal: Ability to manage health-related needs will improve Outcome: Progressing   Problem: Clinical Measurements: Goal: Will remain free from infection Outcome: Progressing Goal: Diagnostic test results will improve Outcome: Progressing Goal: Respiratory complications will improve Outcome: Progressing Goal: Cardiovascular complication will be avoided Outcome: Progressing   Problem: Activity: Goal: Risk for activity intolerance will decrease Outcome: Progressing   Problem: Nutrition: Goal: Adequate nutrition will be maintained Outcome: Progressing   Problem: Coping: Goal: Level of anxiety will decrease Outcome: Progressing   Problem: Elimination: Goal: Will not experience complications related to bowel motility Outcome: Progressing Goal: Will not experience complications related to urinary retention Outcome: Progressing   Problem: Pain Managment: Goal: General experience of comfort will improve Outcome: Progressing   Problem: Safety: Goal: Ability to remain free from injury will improve Outcome: Progressing

## 2017-11-28 DIAGNOSIS — L8990 Pressure ulcer of unspecified site, unspecified stage: Secondary | ICD-10-CM

## 2017-11-28 LAB — GLUCOSE, CAPILLARY
GLUCOSE-CAPILLARY: 34 mg/dL — AB (ref 65–99)
GLUCOSE-CAPILLARY: 487 mg/dL — AB (ref 65–99)
GLUCOSE-CAPILLARY: 118 mg/dL — AB (ref 65–99)
GLUCOSE-CAPILLARY: 134 mg/dL — AB (ref 65–99)
GLUCOSE-CAPILLARY: 95 mg/dL (ref 65–99)
Glucose-Capillary: 143 mg/dL — ABNORMAL HIGH (ref 65–99)
Glucose-Capillary: 141 mg/dL — ABNORMAL HIGH (ref 65–99)

## 2017-11-28 LAB — BASIC METABOLIC PANEL
Anion gap: 9 (ref 5–15)
BUN: 34 mg/dL — AB (ref 6–20)
CALCIUM: 7.6 mg/dL — AB (ref 8.9–10.3)
CHLORIDE: 110 mmol/L (ref 101–111)
CO2: 24 mmol/L (ref 22–32)
Creatinine, Ser: 1.39 mg/dL — ABNORMAL HIGH (ref 0.44–1.00)
GFR, EST AFRICAN AMERICAN: 43 mL/min — AB (ref >60)
GFR, EST NON AFRICAN AMERICAN: 37 mL/min — AB (ref >60)
Glucose, Bld: 130 mg/dL — ABNORMAL HIGH (ref 65–99)
Potassium: 3.2 mmol/L — ABNORMAL LOW (ref 3.5–5.1)
SODIUM: 143 mmol/L (ref 135–145)

## 2017-11-28 LAB — PREPARE RBC (CROSSMATCH)

## 2017-11-28 LAB — CBC
HCT: 23.6 % — ABNORMAL LOW (ref 36.0–46.0)
Hemoglobin: 7.7 g/dL — ABNORMAL LOW (ref 12.0–15.0)
MCH: 29.6 pg (ref 26.0–34.0)
MCHC: 32.6 g/dL (ref 30.0–36.0)
MCV: 90.8 fL (ref 78.0–100.0)
PLATELETS: 279 10*3/uL (ref 150–400)
RBC: 2.6 MIL/uL — ABNORMAL LOW (ref 3.87–5.11)
RDW: 15.3 % (ref 11.5–15.5)
WBC: 12.3 10*3/uL — AB (ref 4.0–10.5)

## 2017-11-28 MED ORDER — POTASSIUM CHLORIDE 10 MEQ/50ML IV SOLN
10.0000 meq | INTRAVENOUS | Status: AC
  Administered 2017-11-28 (×3): 10 meq via INTRAVENOUS
  Filled 2017-11-28 (×3): qty 50

## 2017-11-28 NOTE — Progress Notes (Signed)
Subjective: Interval History: none..   Objective: Vital signs in last 24 hours: Temp:  [97.7 F (36.5 C)-99.3 F (37.4 C)] 98 F (36.7 C) (05/28 0822) Pulse Rate:  [56-127] 110 (05/28 0900) Resp:  [15-35] 22 (05/28 0900) BP: (68-100)/(36-79) 82/65 (05/28 0600) SpO2:  [96 %-100 %] 97 % (05/28 0900) FiO2 (%):  [28 %] 28 % (05/28 0057)  Intake/Output from previous day: 05/27 0701 - 05/28 0700 In: 1661.3 [I.V.:1561.3; IV Piggyback:100] Out: 550 [Urine:550] Intake/Output this shift: No intake/output data recorded.  Still with tenderness in her right lower quadrant.  Dressing intact on her groin.  Left foot looks much better than my last examination.  Well-perfused with dry gangrenous changes on the tips of her toes  Lab Results: Recent Labs    11/27/17 1129 11/28/17 0413  WBC 11.0* 12.3*  HGB 8.6* 7.7*  HCT 26.4* 23.6*  PLT 287 279   BMET Recent Labs    11/27/17 0443 11/28/17 0413  NA 140 143  K 3.3* 3.2*  CL 105 110  CO2 25 24  GLUCOSE 168* 130*  BUN 31* 34*  CREATININE 1.48* 1.39*  CALCIUM 7.9* 7.6*    Studies/Results: Portable Chest X-ray  Result Date: 11/05/2017 CLINICAL DATA:  Endotracheal tube placement. EXAM: PORTABLE CHEST 1 VIEW COMPARISON:  12/12/2015 FINDINGS: Endotracheal tube terminates 5 cm above the carina. Advancement with approximately 1-2 cm may be considered. Shock pads overlie the lower thorax. Left IJ approach central venous catheter terminates at the expected location of proximal superior vena cava. Cardiomediastinal silhouette is normal. Mediastinal contours appear intact. There is no evidence of focal airspace consolidation, pleural effusion or pneumothorax. Osseous structures are without acute abnormality. Soft tissues are grossly normal. IMPRESSION: Endotracheal tube terminates 5 cm above the carina. Advancement with approximately 1-2 cm may be considered. Electronically Signed   By: Ted Mcalpine M.D.   On: 11/26/2017 15:59   Dg Abd  Portable 1v  Result Date: 11/27/2017 CLINICAL DATA:  Nausea, vomiting. EXAM: PORTABLE ABDOMEN - 1 VIEW COMPARISON:  Radiograph of December 09, 2015. FINDINGS: Large amount of stool seen throughout the colon. Atherosclerosis of abdominal aorta is noted. Mild small bowel dilatation is seen in the right side of the abdomen which may represent ileus or possibly distal small bowel obstruction. IMPRESSION: Large stool burden is noted. Mild small bowel dilatation is noted which may represent ileus or possibly distal small bowel obstruction. Aortic Atherosclerosis (ICD10-I70.0). Electronically Signed   By: Lupita Raider, M.D.   On: 11/27/2017 15:16   Anti-infectives: Anti-infectives (From admission, onward)   Start     Dose/Rate Route Frequency Ordered Stop   11/23/17 1000  sulfamethoxazole-trimethoprim (BACTRIM,SEPTRA) 400-80 MG per tablet 1 tablet  Status:  Discontinued     1 tablet Oral 2 times daily 11/23/17 0800 11/23/17 1110   11/23/17 0100  vancomycin (VANCOCIN) IVPB 1000 mg/200 mL premix     1,000 mg 200 mL/hr over 60 Minutes Intravenous Every 12 hours 11/18/2017 1447 11/23/17 1705      Assessment/Plan: s/p Procedure(s): RIGHT GROIN EXPLORATION FOR BLEEDING, Repair Puncture in Right Femoral Graft. (Right) Severely deconditioned.  Had a long discussion with patient and her husband this morning.  She is rather lethargic but does respond.  Splane concern regarding her initial debilitated state even before this major event.  Explained the risk of progression to multisystem organ failure.  She is having coffee-ground emesis and is being treated appropriately for this.  No evidence of active bleeding so do not know  if there is any benefit from endoscopy at this time.  I did explain the critical importance of getting her at least to the edge of the bed and dangling from a pulmonary standpoint.  She does have continued anemia and will transfer refuse her 1 unit packed cells.  She did have a dark stool last  night with Dulcolax suppository and will repeat this this morning.  I did frankly discuss with the husband that this could be something that she could not survive due to her frail state at her baseline.  Fortunately no cardiac difficulty and she has had improvement in her renal function back to baseline.   LOS: 6 days   Jandiel Magallanes 11/28/2017, 9:28 AM

## 2017-11-28 NOTE — Progress Notes (Signed)
Subjective: Somnolent (PCA pump). No reported melena or coffee ground emesis.  Objective: Vital signs in last 24 hours: Temp:  [97.7 F (36.5 C)-99.3 F (37.4 C)] 97.7 F (36.5 C) (05/28 1200) Pulse Rate:  [56-116] 92 (05/28 1355) Resp:  [15-35] 17 (05/28 1355) BP: (68-100)/(36-79) 82/65 (05/28 0600) SpO2:  [96 %-100 %] 96 % (05/28 1355) FiO2 (%):  [28 %] 28 % (05/28 0057) Weight change:  Last BM Date: 11/26/17  PE: GEN:  Somnolent, resting comfortably  Lab Results: CBC    Component Value Date/Time   WBC 12.3 (H) 11/28/2017 0413   RBC 2.60 (L) 11/28/2017 0413   HGB 7.7 (L) 11/28/2017 0413   HCT 23.6 (L) 11/28/2017 0413   HCT 27.2 (L) 12/14/2015 0445   PLT 279 11/28/2017 0413   MCV 90.8 11/28/2017 0413   MCH 29.6 11/28/2017 0413   MCHC 32.6 11/28/2017 0413   RDW 15.3 11/28/2017 0413   LYMPHSABS 1.1 12/11/2015 0428   MONOABS 1.0 12/11/2015 0428   EOSABS 0.1 12/11/2015 0428   BASOSABS 0.0 12/11/2015 0428   CMP     Component Value Date/Time   NA 143 11/28/2017 0413   K 3.2 (L) 11/28/2017 0413   CL 110 11/28/2017 0413   CO2 24 11/28/2017 0413   GLUCOSE 130 (H) 11/28/2017 0413   BUN 34 (H) 11/28/2017 0413   CREATININE 1.39 (H) 11/28/2017 0413   CALCIUM 7.6 (L) 11/28/2017 0413   PROT 5.1 (L) 12/10/2015 0430   ALBUMIN 2.0 (L) 12/10/2015 0430   AST 25 12/10/2015 0430   ALT 20 12/10/2015 0430   ALKPHOS 76 12/10/2015 0430   BILITOT 0.7 12/10/2015 0430   GFRNONAA 37 (L) 11/28/2017 0413   GFRAA 43 (L) 11/28/2017 0413   Assessment:  1.  Coffee ground emesis.  Suspect esophagitis.  No ongoing GI bleeding. 2.  Recent right femoral graft repair.  Plan:  1.  PPI so long as patient remains on anticoagulants/antiplatelets. 2.  No plans for endoscopic evaluation. 3.  Advance diet per surgical/primary team. 4.  [facility] will sign-off; please call with questions; thank you for the consultation.   Freddy Jaksch 11/28/2017, 2:17 PM   Cell [phone] If no answer  or after 5 PM call [phone] \

## 2017-11-28 NOTE — Evaluation (Signed)
Physical Therapy Evaluation Patient Details Name: [name] MRN: [id number] DOB: May 01, 1946 Today's Date: 11/28/2017   History of Present Illness  [name] is a 72 y.o. female today for follow-up of diffuse peripheral vascular occlusive disease dating back over 20 years.  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  She has rest pain in her left foot with no new open ulceration. Pt s/p R femoral graft repair followed by bleeding  Clinical Impression  Pt admitted with above. Pt very deconditioned with L LE pain and on IV pain pump. Pt requiring maxAX2 for all mobility. Per spouse pt was functioning at supervision level, predominately w/c bound but could complete transfer in/out of w/c without assist. Pt to benefit from ST-snf upon d/c to maximize functional recovery prior to returning to assisted living with spouse. Per spouse they live at [facility] and they have a rehab unit there where he prefers her to go to. Acute PT to con't to follow.    Follow Up Recommendations SNF;Supervision/Assistance - 24 hour    Equipment Recommendations  None recommended by PT    Recommendations for Other Services       Precautions / Restrictions Precautions Precautions: Fall Precaution Comments: L LE with necrosis at toes Restrictions Weight Bearing Restrictions: No      Mobility  Bed Mobility Overal bed mobility: Needs Assistance Bed Mobility: Supine to Sit;Sit to Supine     Supine to sit: Max assist;+2 for physical assistance Sit to supine: Max assist;+2 for physical assistance   General bed mobility comments: pt did intiate LE mvmt toward EOB but not trunk. maxA for elevation of trunk and to scoot to EOB and then return to supine  Transfers                 General transfer comment: unable this date due to lethargy and L LE pain  Ambulation/Gait              General Gait Details: pt non-ambulatory  Stairs            Wheelchair Mobility    Modified Rankin (Stroke Patients Only)       Balance Overall balance assessment: Needs assistance Sitting-balance support: Feet supported;Bilateral upper extremity supported Sitting balance-Leahy Scale: Poor Sitting balance - Comments: pt tolerated sitting x 5 but required maxA to maintain balance/upright posture Postural control: Posterior lean                                   Pertinent Vitals/Pain Pain Assessment: Faces Faces Pain Scale: Hurts even more Pain Location: L LE Pain Descriptors / Indicators: Grimacing Pain Intervention(s): Limited activity within patient's tolerance    Home Living Family/patient expects to be discharged to:: Skilled nursing facility Living Arrangements: Spouse/significant other               Additional Comments: per spouse, they reside at [facility]. Per spouse they have a skilled unit that the patient can transition too and he has already notified them    Prior Function Level of Independence: Needs assistance   Gait / Transfers Assistance Needed: uses w/c 95% of time  ADL's / Homemaking Assistance Needed: pt would assist with transfer in/out of shower but pt could dress and bath self        Hand  Dominance   Dominant Hand: Right    Extremity/Trunk Assessment   Upper Extremity Assessment Upper Extremity Assessment: Generalized weakness    Lower Extremity Assessment Lower Extremity Assessment: Generalized weakness(L LE with swelling and errythema)    Cervical / Trunk Assessment Cervical / Trunk Assessment: Normal  Communication   Communication: (pt very sof spoken)  Cognition Arousal/Alertness: Lethargic;Suspect due to medications(sleepy, on IV pain pump) Behavior During Therapy: Flat affect Overall Cognitive Status: Impaired/Different from baseline                                 General  Comments: pt with lethargy, pt did state name and birthdate but didn't offer any other info      General Comments General comments (skin integrity, edema, etc.): pt L LE with limb swelling and necrosis    Exercises     Assessment/Plan    PT Assessment Patient needs continued PT services  PT Problem List Decreased strength;Decreased activity tolerance;Decreased balance;Decreased mobility;Decreased coordination;Decreased cognition;Decreased knowledge of use of DME;Decreased safety awareness;Cardiopulmonary status limiting activity       PT Treatment Interventions DME instruction;Gait training;Stair training;Functional mobility training;Therapeutic activities;Balance training;Therapeutic exercise;Neuromuscular re-education    PT Goals (Current goals can be found in the Care Plan section)  Acute Rehab PT Goals Patient Stated Goal: didn't state PT Goal Formulation: With family Time For Goal Achievement: 12/12/17 Potential to Achieve Goals: Good    Frequency Min 2X/week   Barriers to discharge Decreased caregiver support [facility]. spouse unable to provide maximal assist    Co-evaluation               AM-PAC PT "6 Clicks" Daily Activity  Outcome Measure Difficulty turning over in bed (including adjusting bedclothes, sheets and blankets)?: Unable Difficulty moving from lying on back to sitting on the side of the bed? : Unable Difficulty sitting down on and standing up from a chair with arms (e.g., wheelchair, bedside commode, etc,.)?: Unable Help needed moving to and from a bed to chair (including a wheelchair)?: Total Help needed walking in hospital room?: Total Help needed climbing 3-5 steps with a railing? : Total 6 Click Score: 6    End of Session Equipment Utilized During Treatment: Oxygen Activity Tolerance: Patient limited by lethargy Patient left: in bed;with call bell/phone within reach;with bed alarm set Nurse  Communication: Mobility status PT Visit Diagnosis: Unsteadiness on feet (R26.81);Muscle weakness (generalized) (M62.81);Difficulty in walking, not elsewhere classified (R26.2)    Time: 0352-4818 PT Time Calculation (min) (ACUTE ONLY): 25 min   Charges:   PT Evaluation $PT Eval Moderate Complexity: 1 Mod PT Treatments $Therapeutic Activity: 8-22 mins   PT G Codes:        Lewis Shock, PT, DPT Pager #: [phone] Office #: [phone]   Estalee Mccandlish M Garvin Ellena 11/28/2017, 2:45 PM

## 2017-11-28 NOTE — Progress Notes (Signed)
Paged MD regarding morning lab work of K+ 3.3 and Hgb 7.7.  MD ordered to replace K with three runs and to address hemoglobin during morning rounds. Vitals remain stable. Will continue to monitor.

## 2017-11-29 ENCOUNTER — Ambulatory Visit: Payer: Medicare Other | Admitting: Internal Medicine

## 2017-11-29 DIAGNOSIS — E870 Hyperosmolality and hypernatremia: Secondary | ICD-10-CM

## 2017-11-29 DIAGNOSIS — R112 Nausea with vomiting, unspecified: Secondary | ICD-10-CM

## 2017-11-29 DIAGNOSIS — J9611 Chronic respiratory failure with hypoxia: Secondary | ICD-10-CM

## 2017-11-29 DIAGNOSIS — R0902 Hypoxemia: Secondary | ICD-10-CM

## 2017-11-29 LAB — GLUCOSE, CAPILLARY
GLUCOSE-CAPILLARY: 107 mg/dL — AB (ref 65–99)
GLUCOSE-CAPILLARY: 138 mg/dL — AB (ref 65–99)
GLUCOSE-CAPILLARY: 165 mg/dL — AB (ref 65–99)
GLUCOSE-CAPILLARY: 93 mg/dL (ref 65–99)
Glucose-Capillary: 166 mg/dL — ABNORMAL HIGH (ref 65–99)
Glucose-Capillary: 143 mg/dL — ABNORMAL HIGH (ref 65–99)

## 2017-11-29 LAB — CBC
HCT: 28.7 % — ABNORMAL LOW (ref 36.0–46.0)
Hemoglobin: 9.4 g/dL — ABNORMAL LOW (ref 12.0–15.0)
MCH: 29.2 pg (ref 26.0–34.0)
MCHC: 32.8 g/dL (ref 30.0–36.0)
MCV: 89.1 fL (ref 78.0–100.0)
PLATELETS: 274 10*3/uL (ref 150–400)
RBC: 3.22 MIL/uL — ABNORMAL LOW (ref 3.87–5.11)
RDW: 16.8 % — AB (ref 11.5–15.5)
WBC: 14.4 10*3/uL — ABNORMAL HIGH (ref 4.0–10.5)

## 2017-11-29 LAB — TYPE AND SCREEN
ABO/RH(D): O POS
Antibody Screen: NEGATIVE
Unit division: 0

## 2017-11-29 LAB — BPAM RBC
BLOOD PRODUCT EXPIRATION DATE: 201906212359
ISSUE DATE / TIME: 201905281128
Unit Type and Rh: 5100

## 2017-11-29 LAB — BASIC METABOLIC PANEL
Anion gap: 10 (ref 5–15)
BUN: 25 mg/dL — AB (ref 6–20)
CO2: 23 mmol/L (ref 22–32)
CREATININE: 1.18 mg/dL — AB (ref 0.44–1.00)
Calcium: 7.5 mg/dL — ABNORMAL LOW (ref 8.9–10.3)
Chloride: 114 mmol/L — ABNORMAL HIGH (ref 101–111)
GFR calc Af Amer: 52 mL/min — ABNORMAL LOW (ref >60)
GFR, EST NON AFRICAN AMERICAN: 45 mL/min — AB (ref >60)
Glucose, Bld: 202 mg/dL — ABNORMAL HIGH (ref 65–99)
Potassium: 3.1 mmol/L — ABNORMAL LOW (ref 3.5–5.1)
SODIUM: 147 mmol/L — AB (ref 135–145)

## 2017-11-29 MED ORDER — POTASSIUM CHLORIDE 10 MEQ/50ML IV SOLN
10.0000 meq | INTRAVENOUS | Status: AC
  Administered 2017-11-29 (×3): 10 meq via INTRAVENOUS
  Filled 2017-11-29 (×3): qty 50

## 2017-11-29 NOTE — Progress Notes (Signed)
  Speech Language Pathology Treatment: Dysphagia  Patient Details Name: [name] MRN: [id number] DOB: 15-Sep-1945 Today's Date: 11/29/2017 Time: 0340-3524 SLP Time Calculation (min) (ACUTE ONLY): 27 min  Assessment / Plan / Recommendation Clinical Impression  Pt presents with decreased risk of aspiration 2/2 improved level of arousal as compared to bedside evaluation completed 11/25/17. Pt tolerated all consistencies trialed with no clinical s/s of aspiration. Pt has edentulous top arch, without dentures present, which she stated made mastication difficult.  Pt was able to fully clear regular solid graham cracker with multiple liquid washes.  Husband states pt does not always wear dentures, but that she will put them in for steaks and other difficult to masticate foods.  Husband will bring dentures to hospital. Recommend mechanical soft diet at present with thin liquid. Upgrade diet clinically to regular solid once dentures are present.   HPI HPI: 72 year old woman who underwent left superficial femoral and profundofemoral arterial angiography, angioplasty and then stent placement 5/21.  Course complicated by pseudoaneurysm, acute hemorrhage and associated hemorrhagic shock.  She developed PEA and required brief resuscitation.  She required volume resuscitation, pressors.  She went back to the operating room for exploration and a small defect and her graft was repaired.  She was able to be extubated 5/22. Low-dose morphine PCA for pain. Swallow evaluation ordered due to RN concerns for safety with POs due to decreased alertness. Pt placed on puree diet 5/25, and made NPO for possible erosive esophagitis.  Pt presently on clear liquid diet.  GI has signed off and pt may advance diet per team.      SLP Plan  Continue with current plan of care       Recommendations  Diet recommendations: Dysphagia 3 (mechanical soft);Thin liquid Liquids provided via: Cup;Straw Medication Administration: (No  specific precautions; Meds deliverered by IV at present) Supervision: Staff to assist with self feeding Postural Changes and/or Swallow Maneuvers: Seated upright 90 degrees;Upright 30-60 min after meal(Reflux precautions)            Oral Care Recommendations: Oral care BID SLP Visit Diagnosis: Dysphagia, unspecified (R13.10) Plan: Continue with current plan of care                      Kaylee Macconnell E Dannica Bickham, MA, CCC-SLP.  Pager [phone] 11/29/2017, 12:14 PM

## 2017-11-29 NOTE — Progress Notes (Signed)
Subjective: Interval History: none.. More comfortable today.  Pain under much better control.  Still some discomfort in her right lower quadrant  Objective: Vital signs in last 24 hours: Temp:  [97.3 F (36.3 C)-99 F (37.2 C)] 98 F (36.7 C) (05/29 1614) Pulse Rate:  [89-108] 105 (05/29 1600) Resp:  [14-23] 17 (05/29 1614) BP: (91-100)/(72-81) 91/72 (05/29 0400) SpO2:  [97 %-100 %] 98 % (05/29 1614) Weight:  [169 lb 12.1 oz (77 kg)] 169 lb 12.1 oz (77 kg) (05/29 0400)  Intake/Output from previous day: 05/28 0701 - 05/29 0700 In: 2216.3 [I.V.:1891.3; Blood:325] Out: 1450 [Urine:1450] Intake/Output this shift: Total I/O In: 570 [P.O.:120; I.V.:350; IV Piggyback:100] Out: 325 [Urine:325]  Dressing intact in her right groin.  Left foot with dry gangrene on the tips of her toes otherwise well perfused  Lab Results: Recent Labs    11/28/17 0413 11/29/17 0926  WBC 12.3* 14.4*  HGB 7.7* 9.4*  HCT 23.6* 28.7*  PLT 279 274   BMET Recent Labs    11/28/17 0413 11/29/17 0926  NA 143 147*  K 3.2* 3.1*  CL 110 114*  CO2 24 23  GLUCOSE 130* 202*  BUN 34* 25*  CREATININE 1.39* 1.18*  CALCIUM 7.6* 7.5*    Studies/Results: Portable Chest X-ray  Result Date: 2017-11-23 CLINICAL DATA:  Endotracheal tube placement. EXAM: PORTABLE CHEST 1 VIEW COMPARISON:  12/12/2015 FINDINGS: Endotracheal tube terminates 5 cm above the carina. Advancement with approximately 1-2 cm may be considered. Shock pads overlie the lower thorax. Left IJ approach central venous catheter terminates at the expected location of proximal superior vena cava. Cardiomediastinal silhouette is normal. Mediastinal contours appear intact. There is no evidence of focal airspace consolidation, pleural effusion or pneumothorax. Osseous structures are without acute abnormality. Soft tissues are grossly normal. IMPRESSION: Endotracheal tube terminates 5 cm above the carina. Advancement with approximately 1-2 cm may be  considered. Electronically Signed   By: Ted Mcalpine M.D.   On: Nov 23, 2017 15:59   Dg Abd Portable 1v  Result Date: 11/27/2017 CLINICAL DATA:  Nausea, vomiting. EXAM: PORTABLE ABDOMEN - 1 VIEW COMPARISON:  Radiograph of December 09, 2015. FINDINGS: Large amount of stool seen throughout the colon. Atherosclerosis of abdominal aorta is noted. Mild small bowel dilatation is seen in the right side of the abdomen which may represent ileus or possibly distal small bowel obstruction. IMPRESSION: Large stool burden is noted. Mild small bowel dilatation is noted which may represent ileus or possibly distal small bowel obstruction. Aortic Atherosclerosis (ICD10-I70.0). Electronically Signed   By: Lupita Raider, M.D.   On: 11/27/2017 15:16   Anti-infectives: Anti-infectives (From admission, onward)   Start     Dose/Rate Route Frequency Ordered Stop   11/23/17 1000  sulfamethoxazole-trimethoprim (BACTRIM,SEPTRA) 400-80 MG per tablet 1 tablet  Status:  Discontinued     1 tablet Oral 2 times daily 11/23/17 0800 11/23/17 1110   11/23/17 0100  vancomycin (VANCOCIN) IVPB 1000 mg/200 mL premix     1,000 mg 200 mL/hr over 60 Minutes Intravenous Every 12 hours 2017/11/23 1447 11/23/17 1705      Assessment/Plan: s/p Procedure(s): RIGHT GROIN EXPLORATION FOR BLEEDING, Repair Puncture in Right Femoral Graft. (Right) Nausea is resolved.  Will begin clears and advance as tolerated.  H&H stable after 1 unit packed red blood cells.  Replace potassium.  Have asked critical care medicine to consult for assistance and management   LOS: 7 days   [name] 11/29/2017, 4:33 PM

## 2017-11-29 NOTE — Plan of Care (Signed)
Pt advanced to mechanical soft diet.  Goal upgraded

## 2017-11-29 NOTE — Progress Notes (Signed)
Two medium dark stools overnight.

## 2017-11-29 NOTE — NC FL2 (Signed)
[place] MEDICAID FL2 LEVEL OF CARE SCREENING TOOL     IDENTIFICATION  Patient Name: [name] Birthdate: [birth year] Sex: female Admission Date (Current Location): 11/09/2017  [facility] and [name] Number:  Producer, television/film/video and Address:  [facility], [address], [place], [place] [address]      Provider Number: [id number]  Attending Physician Name and Address:  Nada Libman, MD  Relative Name and Phone Number:  Gertude Benito, spouse, [phone]     Current Level of Care: Hospital Recommended Level of Care: Skilled Nursing Facility Prior Approval Number:    Date Approved/Denied:   PASRR Number:    Discharge Plan:      Current Diagnoses: Patient Active Problem List   Diagnosis Date Noted  . Pressure injury of skin 11/28/2017  . Endotracheal tube present   . Pulmonary edema   . Acute respiratory failure with hypoxemia (HCC)   . CHF (congestive heart failure) (HCC) 12/09/2015  . Acute on chronic diastolic (congestive) heart failure (HCC) 12/09/2015  . Sepsis (HCC) 12/09/2015  . HCAP (healthcare-associated pneumonia) 12/09/2015  . Elevated troponin 12/09/2015  . Dry gangrene (HCC) 12/09/2015  . Diabetes mellitus with complication (HCC) 12/09/2015  . GERD without esophagitis 12/09/2015  . Essential hypertension 12/09/2015  . AKI (acute kidney injury) (HCC) 12/09/2015  . Anxiety 12/09/2015  . Chronic pain 12/09/2015  . HLD (hyperlipidemia) 12/09/2015  . Acute congestive heart failure (HCC)   . HTN (hypertension) 09/04/2015  . Pressure ulcer 08/20/2015  . PAD (peripheral artery disease) (HCC) 08/19/2015  . Cellulitis and abscess of foot 06/05/2015  . Encounter for central line placement 08/20/2014  . UTI (lower urinary tract infection) 08/19/2014  . Aftercare following surgery of the circulatory system, NEC 01/22/2013  . PVD (peripheral vascular disease) (HCC) 08/21/2012  . Fall from slipping 07/10/2012  . Non-healing  surgical wound 06/14/2012  . S/P BKA (below knee amputation) (HCC) 04/27/2012  . Acute myocardial infarction, subendocardial infarction, initial episode of care (HCC) 04/01/2012  . Acute combined systolic and diastolic heart failure (HCC) 03/30/2012  . Pulmonary edema, acute (HCC) 03/29/2012  . Respiratory failure, post-operative (HCC) 03/26/2012  . Pain in limb 03/20/2012  . Atherosclerotic PVD with intermittent claudication (HCC) 02/21/2012  . Atherosclerosis of native arteries of the extremities with ulceration (HCC) 11/11/2011  . Chronic ulcer of unspecified site 11/11/2011  . Cellulitis 11/11/2011  . Atherosclerosis of native arteries of extremity with intermittent claudication (HCC) 09/06/2011  . CORONARY ATHEROSCLEROSIS NATIVE CORONARY ARTERY 03/12/2009  . 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 Hyperlipemia 10/11/2008  . HYPERTENSION, UNSPECIFIED 10/11/2008  . UNSPECIFIED PERIPHERAL VASCULAR DISEASE 10/11/2008    Orientation RESPIRATION BLADDER Height & Weight     Self, Place  Normal Incontinent, External catheter Weight: 169 lb 12.1 oz (77 kg) Height:  5\' 4"  (162.6 cm)  BEHAVIORAL SYMPTOMS/MOOD NEUROLOGICAL BOWEL NUTRITION STATUS      Incontinent Diet  AMBULATORY STATUS COMMUNICATION OF NEEDS Skin   Extensive Assist Verbally PU Stage and Appropriate Care, Other (Comment)(PU stage I coccyx, closed incision R groin, closed incision R abdomen, diabetic ulcer all L toes, diabetic ulcer R finger)                       Personal Care Assistance Level of Assistance  Bathing, Feeding, Dressing Bathing Assistance: Maximum assistance Feeding assistance: Limited assistance Dressing Assistance: Maximum assistance     Functional  Limitations Info  Sight, Hearing, Speech Sight Info: Adequate Hearing Info: Adequate Speech Info: Adequate    SPECIAL CARE FACTORS FREQUENCY  PT (By licensed PT)      PT Frequency: 5x/week              Contractures Contractures Info: Not present    Additional Factors Info  Code Status, Allergies Code Status Info: Full Allergies Info:  Acetaminophen, Ceftriaxone, Codeine, Erythromycin, Hydrocodone-acetaminophen, Hydromorphone, Propoxyphene Hcl, Statins, Eggs Or Egg-derived Products, Ibuprofen, Shellfish-derived Products           Current Medications (11/29/2017):  This is the current hospital active medication list Current Facility-Administered Medications  Medication Dose Route Frequency Provider Last Rate Last Dose  . 0.9 %  sodium chloride infusion  250 mL Intravenous PRN Rhyne, Samantha J, PA-C      . 0.9 %  sodium chloride infusion   Intravenous Once Rhyne, Samantha J, PA-C      . acetaminophen (TYLENOL) tablet 325-650 mg  325-650 mg Oral Q4H PRN Rhyne, Samantha J, PA-C       Or  . acetaminophen (TYLENOL) suppository 325-650 mg  325-650 mg Rectal Q4H PRN Rhyne, Samantha J, PA-C      . bisacodyl (DULCOLAX) suppository 10 mg  10 mg Rectal Daily PRN Fransisco Hertz, MD   10 mg at 11/28/17 0039  . Chlorhexidine Gluconate Cloth 2 % PADS 6 each  6 each Topical Daily Early, Kristen Loader, MD   6 each at 11/29/17 [phone]  . dextrose 5 %-0.45 % sodium chloride infusion   Intravenous Continuous Simonne Martinet, NP 50 mL/hr at 11/29/17 1000    . dextrose 50 % solution 50 mL  50 mL Intravenous PRN Nada Libman, MD   50 mL at 11/25/17 0033  . diphenhydrAMINE (BENADRYL) injection 12.5 mg  12.5 mg Intravenous Q6H PRN Clinton Gallant M, PA-C       Or  . diphenhydrAMINE (BENADRYL) 12.5 MG/5ML elixir 12.5 mg  12.5 mg Oral Q6H PRN Clinton Gallant M, PA-C      . feeding supplement (GLUCERNA SHAKE) (GLUCERNA SHAKE) liquid 237 mL  237 mL Oral TID WC Fransisco Hertz, MD   237 mL at 11/26/17 1711  . furosemide (LASIX) injection 20 mg  20 mg Intravenous Q12H Fransisco Hertz, MD   20 mg at 11/29/17 0428  . insulin aspart (novoLOG) injection 0-15 Units  0-15 Units Subcutaneous Q4H  Simonne Martinet, NP   3 Units at 11/29/17 1248  . LORazepam (ATIVAN) injection 0.5 mg  0.5 mg Intravenous Q6H PRN Fransisco Hertz, MD      . MEDLINE mouth rinse  15 mL Mouth Rinse BID Nada Libman, MD   15 mL at 11/29/17 1000  . metoprolol tartrate (LOPRESSOR) injection 5 mg  5 mg Intravenous Q6H Fransisco Hertz, MD   5 mg at 11/29/17 1153  . morphine 2 mg/mL PCA injection   Intravenous Q4H Collins, Emma M, PA-C      . naloxone [facility]) injection 0.4 mg  0.4 mg Intravenous PRN Clinton Gallant M, PA-C       And  . sodium chloride flush (NS) 0.9 % injection 9 mL  9 mL Intravenous PRN Clinton Gallant M, PA-C      . nitroGLYCERIN (NITROSTAT) SL tablet 0.4 mg  0.4 mg Sublingual Q5 min PRN Clinton Gallant M, PA-C      . ondansetron [facility]) injection 4 mg  4 mg Intravenous Q6H PRN Lupita Leash,  MD   4 mg at 11/27/17 1333  . pantoprazole (PROTONIX) 80 mg in sodium chloride 0.9 % 250 mL (0.32 mg/mL) infusion  8 mg/hr Intravenous Continuous Charlott Rakes, MD 25 mL/hr at 11/29/17 1000 8 mg/hr at 11/29/17 1000  . [START ON 11/30/2017] pantoprazole (PROTONIX) injection 40 mg  40 mg Intravenous Q12H Charlott Rakes, MD      . potassium chloride 10 mEq in 50 mL *CENTRAL LINE* IVPB  10 mEq Intravenous Q1 Hr x 3 Early, Kristen Loader, MD   Stopped at 11/29/17 1536  . sodium chloride (OCEAN) 0.65 % nasal spray 2-4 spray  2-4 spray Each Nare Q4H PRN Lars Mage, PA-C      . sodium chloride flush (NS) 0.9 % injection 10-40 mL  10-40 mL Intracatheter Q12H Early, Kristen Loader, MD   10 mL at 11/28/17 2246  . sodium chloride flush (NS) 0.9 % injection 10-40 mL  10-40 mL Intracatheter PRN Early, Kristen Loader, MD      . sodium phosphate (FLEET) 7-19 GM/118ML enema 1 enema  1 enema Rectal Daily PRN Fransisco Hertz, MD         Discharge Medications: Please see discharge summary for a list of discharge medications.  Relevant Imaging Results:  Relevant Lab Results:   Additional Information SSN: [id number]  Abigail Butts,  LCSW

## 2017-11-29 NOTE — Clinical Social Work Note (Signed)
Clinical Social Work Assessment  Patient Details  Name: [name] MRN: [id number] Date of Birth: 01-15-1946  Date of referral:  11/29/17               Reason for consult:  Facility Placement, Discharge Planning(from [facility])                Permission sought to share information with:  Facility Medical sales representative Permission granted to share information::  No(patient not fully oriented, spoke to spouse via phone)  Name::     Trena Blamer  Agency::  [facility]  Relationship::  spouse  Contact Information:  [phone]  Housing/Transportation Living arrangements for the past 2 months:  Independent Living Facility Source of Information:  Spouse Patient Interpreter Needed:  None Criminal Activity/Legal Involvement Pertinent to Current Situation/Hospitalization:  No - Comment as needed Significant Relationships:  Spouse Lives with:  Spouse, Facility Resident Do you feel safe going back to the place where you live?  Yes Need for family participation in patient care:  No (Coment)  Care giving concerns: Patient and spouse from [facility]. PT recommending SNF.   Social Worker assessment / plan: CSW spoke to patient's spouse, Hessie Diener, on the phone. Patient is not fully oriented. CSW introduced self and role. Hessie Diener reported that he and patient live at [facility] and he is agreeable for patient to go to rehab when medically stable and discharged from hospital.  CSW spoke to admissions at [facility]. They are aware patient is admitted and agreeable to take her in their SNF when stable. CSW to follow and support with disposition planning.  Employment status:  Retired Health and safety inspector:  [employer] PT Recommendations:  Skilled Nursing Facility Information / Referral to community resources:  Skilled Nursing Facility  Patient/Family's Response to care: Spouse appreciative of care.  Patient/Family's Understanding of and Emotional Response to Diagnosis, Current Treatment, and  Prognosis: Spouse with understanding of patient's condition and agreeable to rehab at [facility] SNF when medically ready.  Emotional Assessment Appearance:  Appears stated age Attitude/Demeanor/Rapport:  Unable to Assess Affect (typically observed):  Unable to Assess Orientation:  Oriented to Self, Oriented to Place Alcohol / Substance use:  Not Applicable Psych involvement (Current and /or in the community):  No (Comment)  Discharge Needs  Concerns to be addressed:  Discharge Planning Concerns, Care Coordination Readmission within the last 30 days:  No Current discharge risk:  Physical Impairment, Dependent with Mobility, Cognitively Impaired Barriers to Discharge:  Continued Medical Work up   Abigail Butts, LCSW 11/29/2017, 3:13 PM

## 2017-11-29 NOTE — Progress Notes (Addendum)
PULMONARY / CRITICAL CARE MEDICINE   Name: [name] MRN: [id number] DOB: March 13, 1946    ADMISSION DATE:  11/03/2017 CONSULTATION DATE:  5/22  REFERRING MD:  Early   CHIEF COMPLAINT:  S/p cardiac arrest: ventilatory and critical care management   HISTORY OF PRESENT ILLNESS:   72 year old woman who underwent left superficial femoral and profundofemoral arterial angiography, angioplasty and then stent placement 5/21.  Course complicated by pseudoaneurysm, acute hemorrhage and associated hemorrhagic shock.  She developed PEA and required brief resuscitation.  She required volume resuscitation, pressors.  She went back to the operating room for exploration and a small defect and her graft was repaired.  She was able to be extubated 5/22.  She has required intermittently pressors, last off 5/25 as accurate blood pressure measurements have been difficult to obtain.  Her renal function continues to improve with adequate urine output.  She developed coffee ground emesis on 5/27 with ongoing dark stools.  GI consulted with recommendations for medical management only.  Her hemoglobin has continued to drift, requiring transfusion on 5/28.   PCCM asked to see again on 5/29 for any further recommendations in care.   SUBJECTIVE:  No active complaints from patient ( has PCA Pump) When she has pain, she complains of lower abd pain  RN reports am labs pending BP remains to be difficult to obtain accurate reading given her PVD disease and therefore is guided by mental status, femoral pulses, and UOP.  VITAL SIGNS: BP 91/72   Pulse (!) 103   Temp (!) 97.3 F (36.3 C) (Oral)   Resp 19   Ht 5\' 4"  (1.626 m)   Wt 169 lb 12.1 oz (77 kg)   SpO2 100%   BMI 29.14 kg/m   HEMODYNAMICS:    VENTILATOR SETTINGS:    INTAKE / OUTPUT:  Intake/Output Summary (Last 24 hours) at 11/29/2017 0856 Last data filed at 11/29/2017 0800 Gross per 24 hour  Intake 2291.25 ml  Output 1450 ml  Net 841.25 ml      PHYSICAL EXAMINATION: General:  Chronically ill appearing elderly female lying in bed in NAD HEENT: MM pink/moist, L pupil 3/reactive/ R pupils 5 (not new change) Neuro: Alert, oriented to person, place, month, MAE, generalized weakness CV:  rrr, no m/r/g, distal pulses weak, barely palpable, mostly requires doppler  PULM: even/non-labored, lungs bilaterally clear anteriorly, slight bibasilar rales GI: soft, BS +, mild tenderness in lower abd, no guarding, dressing dry to R groin, diffuse ecchymosis to lower abd/ groin/ perineum area  Extremities: warm/dry, RKA, left toes with discoloration, trace LLL edema Skin: sacral dressing intact  LABS:  BMET Recent Labs  Lab 11/25/17 0413 11/27/17 0443 11/28/17 0413  NA 137 140 143  K 4.7 3.3* 3.2*  CL 109 105 110  CO2 20* 25 24  BUN 38* 31* 34*  CREATININE 2.52* 1.48* 1.39*  GLUCOSE 78 168* 130*    Electrolytes Recent Labs  Lab 11/23/17 0340  11/25/17 0413 11/27/17 0443 11/28/17 0413  CALCIUM 7.1*   < > 7.6* 7.9* 7.6*  MG 1.3*  --  2.3  --   --   PHOS 4.9*  --   --   --   --    < > = values in this interval not displayed.    CBC Recent Labs  Lab 11/27/17 0443 11/27/17 1129 11/28/17 0413  WBC 9.4 11.0* 12.3*  HGB 9.3* 8.6* 7.7*  HCT 28.2* 26.4* 23.6*  PLT 280 287 279    Coag's No  results for input(s): APTT, INR in the last 168 hours.  Sepsis Markers No results for input(s): LATICACIDVEN, PROCALCITON, O2SATVEN in the last 168 hours.  ABG No results for input(s): PHART, PCO2ART, PO2ART in the last 168 hours.  Liver Enzymes No results for input(s): AST, ALT, ALKPHOS, BILITOT, ALBUMIN in the last 168 hours.  Cardiac Enzymes No results for input(s): TROPONINI, PROBNP in the last 168 hours.  Glucose Recent Labs  Lab 11/28/17 1140 11/28/17 1614 11/28/17 1959 11/29/17 0005 11/29/17 0350 11/29/17 0746  GLUCAP 143* 95 141* 138* 93 107*    Imaging No results found.   STUDIES:     CULTURES:   ANTIBIOTICS:   SIGNIFICANT EVENTS:   LINES/TUBES: Left IJ triple-lumen catheter placed 5/22  DISCUSSION: 72 year old female with a history of peripheral vascular disease, and diabetes.  Presented for occluded left superficial femoral artery, underwent stenting however prior to discharge developed acute groin hematoma, profound hypotensive, and near cardiac arrest requiring emergent intubation and exploratory surgery where she underwent evacuation of hematoma and was found to have a unsure on the femoral sheath which was repaired.  Intraoperatively she received 4 units of blood.  She was extubated, off pressors, awake/ oriented as of 5/22.  5/22 with AKI most likely from hypoperfusion state.  She has had ongoing flank discomfort.   5/24 Transiently placed back on levophed, Hgb remained stable.  Remains on PCA pain pump. 5/25   SLP assessed, dysphagia 1 5/26    Difficulty obtaining accurate BP; UOP stable, sCr improving 5/27   developed large amount coffee ground emesis and started on PPI q 12.  Hgb stable.  NPO. GI consulted with medical management only.  5/28    transfused 1 unit for Hgb 7.7  ASSESSMENT / PLAN: Resolved issues: Acute encephalopathy Hemorrhagic shock Acute respiratory failure  Current issues Acute blood loss anemia secondary to puncture of right femoral graft with resultant right groin hematoma in patient with severe peripheral vascular disease Now status post repair by vascular surgery Plan Per vascular Holding anticoagulation Continue pulse checks  Coffee ground emesis/ dark stools - from suspected esophagitis - GI consulted, signed off 5/28 - advised medical management only, no plans for EGD P:  Monitor Advance diet as tolerated Finishing protonix gtt x 72 hours 5/29 then Protonix 40 mg q 12 Monitor Hgb, transfuse for Hgb < 8 given CAD  History of coronary artery disease -Last EF estimated 55 to 60% back in 2017 Plan Continue  telemetry Lopressor 5 mg q 6 hrs, caution as BP readings are inaccurate  Holding ASA given suspected esophagitis   AKA - improving with  Plan Continue D5/0.45 NS at 50 ml/hr Lasix 20 meq BID  Hypomagnesemia - resolved Plan Trend   Diabetes with hyperglycemia Plan Sliding scale insulin  Mild aspiration risk Plan Dysphagia 1 puree; thin liquid per SLP Aspiration risk   FAMILY  - Updates:  No family at bedside.  Patient updated.   - Inter-disciplinary family meet or Palliative Care meeting due by: ongoing  Global: Patient has continued to have slow progression.  She remains on room air with adequate mental status.  Her BP remains difficult to assess given her severe PVD.  Per prognosis remains poor given her complicated hospitalization in addition to her prior health conditions and debilitated state.  At this time, PCCM as nothing more to add to current plan of care.  Continue goals of care discussions as able, consider PMT consult.    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  BP measurement is very inaccurate and mental status and UOP have essentially been the way BP is measured.  Patient is making very little progress and PCCM was consulted to insure that nothing further is missed.  On exam, distant BS diffusely.  I reviewed CXR myself, no acute disease noted.  Discussed with PCCM-NP.  Respiratory failure: resolved, monitor for airway protection  Hypoxemia: Titrate O2 for sat of 88-92%.  N/V: ?intestinal angina, but unlikely  - Zofran PRN  - Advance diet as tolerated  - GI following  Hypernatremia:   - D/C D5 1/2 NS  - D/C lasix  Disposition: patient to be transferred out of the ICU and to SDU.  Start looking for placement.  Ask TRH to do medical  management once out of the ICU.  Ultimately need palliative care involvement and resolution of conflict with the husband.  Was not in the room upon our evaluation.  PCCM will sign off, please call back if needed.  Patient seen and examined, agree with above note.  I dictated the care and orders written for this patient under my direction.  Alyson Reedy, MD [phone]

## 2017-11-30 LAB — CBC
HCT: 27.2 % — ABNORMAL LOW (ref 36.0–46.0)
Hemoglobin: 8.6 g/dL — ABNORMAL LOW (ref 12.0–15.0)
MCH: 28.5 pg (ref 26.0–34.0)
MCHC: 31.6 g/dL (ref 30.0–36.0)
MCV: 90.1 fL (ref 78.0–100.0)
PLATELETS: 303 10*3/uL (ref 150–400)
RBC: 3.02 MIL/uL — AB (ref 3.87–5.11)
RDW: 17 % — AB (ref 11.5–15.5)
WBC: 12.8 10*3/uL — AB (ref 4.0–10.5)

## 2017-11-30 LAB — BASIC METABOLIC PANEL
ANION GAP: 7 (ref 5–15)
BUN: 19 mg/dL (ref 6–20)
CALCIUM: 7.3 mg/dL — AB (ref 8.9–10.3)
CO2: 23 mmol/L (ref 22–32)
CREATININE: 1 mg/dL (ref 0.44–1.00)
Chloride: 113 mmol/L — ABNORMAL HIGH (ref 101–111)
GFR, EST NON AFRICAN AMERICAN: 55 mL/min — AB (ref >60)
Glucose, Bld: 155 mg/dL — ABNORMAL HIGH (ref 65–99)
Potassium: 3 mmol/L — ABNORMAL LOW (ref 3.5–5.1)
SODIUM: 143 mmol/L (ref 135–145)

## 2017-11-30 LAB — GLUCOSE, CAPILLARY
GLUCOSE-CAPILLARY: 131 mg/dL — AB (ref 65–99)
GLUCOSE-CAPILLARY: 152 mg/dL — AB (ref 65–99)
GLUCOSE-CAPILLARY: 153 mg/dL — AB (ref 65–99)
GLUCOSE-CAPILLARY: 161 mg/dL — AB (ref 65–99)
GLUCOSE-CAPILLARY: 174 mg/dL — AB (ref 65–99)
Glucose-Capillary: 109 mg/dL — ABNORMAL HIGH (ref 65–99)
Glucose-Capillary: 126 mg/dL — ABNORMAL HIGH (ref 65–99)

## 2017-11-30 LAB — POTASSIUM: POTASSIUM: 3.1 mmol/L — AB (ref 3.5–5.1)

## 2017-11-30 MED ORDER — POTASSIUM CHLORIDE 10 MEQ/50ML IV SOLN
10.0000 meq | INTRAVENOUS | Status: AC
  Administered 2017-11-30 (×3): 10 meq via INTRAVENOUS
  Filled 2017-11-30: qty 50

## 2017-11-30 MED ORDER — POTASSIUM CHLORIDE 10 MEQ/50ML IV SOLN
10.0000 meq | INTRAVENOUS | Status: AC
Start: 2017-11-30 — End: 2017-12-01
  Administered 2017-11-30 (×3): 10 meq via INTRAVENOUS
  Filled 2017-11-30 (×5): qty 50

## 2017-11-30 NOTE — Progress Notes (Signed)
Subjective: Interval History: none.. More comfortable this morning.  Reports that she slept well.  Objective: Vital signs in last 24 hours: Temp:  [97.3 F (36.3 C)-98.2 F (36.8 C)] 97.3 F (36.3 C) (05/30 0338) Pulse Rate:  [88-108] 95 (05/30 0600) Resp:  [16-21] 21 (05/30 0600) SpO2:  [96 %-100 %] 96 % (05/30 0600) Weight:  [175 lb 4.3 oz (79.5 kg)] 175 lb 4.3 oz (79.5 kg) (05/30 0600)  Intake/Output from previous day: 05/29 0701 - 05/30 0700 In: 1995 [P.O.:120; I.V.:1775; IV Piggyback:100] Out: 1725 [Urine:1725] Intake/Output this shift: No intake/output data recorded.  Groin incision healing.  Less tenderness.  Left foot looks very good with gangrenous changes on the tips of her toes  Lab Results: Recent Labs    11/28/17 0413 11/29/17 0926  WBC 12.3* 14.4*  HGB 7.7* 9.4*  HCT 23.6* 28.7*  PLT 279 274   BMET Recent Labs    11/28/17 0413 11/29/17 0926  NA 143 147*  K 3.2* 3.1*  CL 110 114*  CO2 24 23  GLUCOSE 130* 202*  BUN 34* 25*  CREATININE 1.39* 1.18*  CALCIUM 7.6* 7.5*    Studies/Results: Portable Chest X-ray  Result Date: 12/01/2017 CLINICAL DATA:  Endotracheal tube placement. EXAM: PORTABLE CHEST 1 VIEW COMPARISON:  12/12/2015 FINDINGS: Endotracheal tube terminates 5 cm above the carina. Advancement with approximately 1-2 cm may be considered. Shock pads overlie the lower thorax. Left IJ approach central venous catheter terminates at the expected location of proximal superior vena cava. Cardiomediastinal silhouette is normal. Mediastinal contours appear intact. There is no evidence of focal airspace consolidation, pleural effusion or pneumothorax. Osseous structures are without acute abnormality. Soft tissues are grossly normal. IMPRESSION: Endotracheal tube terminates 5 cm above the carina. Advancement with approximately 1-2 cm may be considered. Electronically Signed   By: Ted Mcalpine M.D.   On: 11/05/2017 15:59   Dg Abd Portable 1v  Result  Date: 11/27/2017 CLINICAL DATA:  Nausea, vomiting. EXAM: PORTABLE ABDOMEN - 1 VIEW COMPARISON:  Radiograph of December 09, 2015. FINDINGS: Large amount of stool seen throughout the colon. Atherosclerosis of abdominal aorta is noted. Mild small bowel dilatation is seen in the right side of the abdomen which may represent ileus or possibly distal small bowel obstruction. IMPRESSION: Large stool burden is noted. Mild small bowel dilatation is noted which may represent ileus or possibly distal small bowel obstruction. Aortic Atherosclerosis (ICD10-I70.0). Electronically Signed   By: Lupita Raider, M.D.   On: 11/27/2017 15:16   Anti-infectives: Anti-infectives (From admission, onward)   Start     Dose/Rate Route Frequency Ordered Stop   11/23/17 1000  sulfamethoxazole-trimethoprim (BACTRIM,SEPTRA) 400-80 MG per tablet 1 tablet  Status:  Discontinued     1 tablet Oral 2 times daily 11/23/17 0800 11/23/17 1110   11/23/17 0100  vancomycin (VANCOCIN) IVPB 1000 mg/200 mL premix     1,000 mg 200 mL/hr over 60 Minutes Intravenous Every 12 hours 11/14/2017 1447 11/23/17 1705      Assessment/Plan: s/p Procedure(s): RIGHT GROIN EXPLORATION FOR BLEEDING, Repair Puncture in Right Femoral Graft. (Right) Stable overall.  Will transfer to 4 E. stepdown today.  Needs to mobilize with physical therapy.   LOS: 8 days   [name] 11/30/2017, 7:23 AM

## 2017-11-30 NOTE — Progress Notes (Signed)
Morphine PCA syringe replaced. 18mL morphine from old syringe wasted in sink. Witnessed by Dois Davenport, RN.   Ardeen Jourdain BSN, RN

## 2017-11-30 NOTE — Progress Notes (Signed)
  Speech Language Pathology Treatment: Dysphagia  Patient Details Name: [name] MRN: [id number] DOB: [birth year] Today's Date: 11/30/2017 Time: 1202-1210 SLP Time Calculation (min) (ACUTE ONLY): 8 min  Assessment / Plan / Recommendation Clinical Impression  Upper denture plate has not been brought to hospital and pt again stated she rarely wears it (suspect she will not donn even if husband bring them). Oral cavity dry, moistened with oral care and sips water prior to graham cracker. Prolonged mastication and bolus formation using water to facilitate. No s/s aspiration. She required encouragement to allow repositioning and raise head of bed and suspect she may be at suboptimal position for meals although she states she sits up to eat. Recommend continue Dys 3, thin. Will continue to reiterate strategies for safe po (uncertain if/when she can upgrade to regular but will continue intervention).   HPI HPI: 72 year old woman who underwent left superficial femoral and profundofemoral arterial angiography, angioplasty and then stent placement 5/21.  Course complicated by pseudoaneurysm, acute hemorrhage and associated hemorrhagic shock.  She developed PEA and required brief resuscitation.  She required volume resuscitation, pressors.  She went back to the operating room for exploration and a small defect and her graft was repaired.  She was able to be extubated 5/22. Low-dose morphine PCA for pain. Swallow evaluation ordered due to RN concerns for safety with POs due to decreased alertness.      SLP Plan  Continue with current plan of care       Recommendations  Diet recommendations: Dysphagia 3 (mechanical soft);Thin liquid Liquids provided via: Cup;Straw Medication Administration: Whole meds with liquid Supervision: Patient able to self feed;Intermittent supervision to cue for compensatory strategies Compensations: Slow rate;Small sips/bites;Minimize environmental distractions Postural  Changes and/or Swallow Maneuvers: Seated upright 90 degrees;Upright 30-60 min after meal                Oral Care Recommendations: Oral care BID SLP Visit Diagnosis: Dysphagia, unspecified (R13.10) Plan: Continue with current plan of care       GO                [name] 11/30/2017, 12:13 PM  [name].Ed [employer] [phone]

## 2017-11-30 NOTE — Progress Notes (Signed)
Pt alert and verbally responsive. Continue to use PCA Morphine. O2 @ 98-100 RA  She is getting K replacement. No c/o's voice we will continue to monitor.

## 2017-11-30 NOTE — Care Management Note (Signed)
Case Management Note  Patient Details  Name: [name] MRN: [id number] Date of Birth: [birth year]  Subjective/Objective:  Repair of puncture in right femoral graft                 Action/Plan: NCM spoke to pt and husband at bedside. Pt lives at [place] and plan is to go to rehab at [facility]. CSW following for SNF placement.   Expected Discharge Date:  11/30/2017               Expected Discharge Plan:  Skilled Nursing Facility  In-House Referral:  Clinical Social Work  Discharge planning Services  CM Consult  Post Acute Care Choice:  NA Choice offered to:  NA  DME Arranged:  N/A DME Agency:  NA  HH Arranged:  NA HH Agency:  NA  Status of Service:  Completed, signed off  If discussed at [employer] of Stay Meetings, dates discussed:    Additional Comments:  Elliot Cousin, RN 11/30/2017, 5:08 PM

## 2017-11-30 NOTE — Progress Notes (Signed)
Received patient from 2H in stable condition. Oriented to room, call light and unit.

## 2017-11-30 NOTE — Consult Note (Signed)
WOC Nurse wound consult note Reason for Consult: sacrum Wound type: Deep tissue injury;sacrum  Discovered on 11/27/17 Pressure Injury POA: Yes Measurement: 10cm x 9cm x 0.1cm  Wound bed:70% dark purple non blanchable tissue with superficial skin peeling on the left side of the wound bed, serous filled bulla in the apex of the gluteal clef and some darkening of the tissue that extends onto the right buttock.  All consistent with DTI.   Patient has required vasopressors with known hypoperfusion and spent significant time on the OR table for vascular repair. Skin failure with pressure injury is high risk in this patient population. It is also noted that repositioning and turning has been refused or patient has been non compliant with this prevention measure since the 11/25/17 as documented in the chart Drainage (amount, consistency, odor) none Periwound:intact Dressing procedure/placement/frequency: Silicone foam to protect site She is scheduled for transfer to 4E, I have requested nursing tech/secretary to order patient low air loss mattress prior to her arrival or once she has been admitted to this unit.   WOC Nurse team will follow along with you for weekly wound assessments.  Please notify me of any acute changes in the wounds or any new areas of concerns Everhett Bozard [facility] MSN, RN,CWOCN, CNS, CWON-AP [phone]

## 2017-11-30 NOTE — Progress Notes (Signed)
Physical Therapy Treatment Patient Details Name: [name] MRN: [id number] DOB: 05-Dec-1945 Today's Date: 11/30/2017    History of Present Illness [name] is a 72 y.o. female today for follow-up of diffuse peripheral vascular occlusive disease dating back over 20 years.  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  She has rest pain in her left foot with no new open ulceration. Pt s/p R femoral graft repair followed by bleeding    PT Comments    Upon entry, pt and family requesting pain meds and displeased her PCA was empty, initially refusing therapy but agreeable after short discussion of benefits. Pt mod A x2 for bed mobility, tolerating sitting EOB to work on therex for 10 minutes. Pt refusing to attempt tranfers at this time. Will cont to follow. SNF recs appropriate.     Follow Up Recommendations  SNF;Supervision/Assistance - 24 hour     Equipment Recommendations  None recommended by PT    Recommendations for Other Services       Precautions / Restrictions Precautions Precautions: Fall Precaution Comments: L LE with necrosis at toes Restrictions Weight Bearing Restrictions: No    Mobility  Bed Mobility Overal bed mobility: Needs Assistance Bed Mobility: Supine to Sit;Sit to Supine     Supine to sit: Mod assist;+2 for physical assistance Sit to supine: Mod assist;+2 for physical assistance   General bed mobility comments: Mod A x2 to support trunk and bring legs over EOB. patient requires max A for repostioning in bed. pt citing high pain levels  Transfers                 General transfer comment: defered 2/2 to pain and patients family request   Ambulation/Gait                 Stairs             Wheelchair Mobility    Modified Rankin (Stroke Patients Only)       Balance Overall balance assessment: Needs  assistance   Sitting balance-Leahy Scale: Poor Sitting balance - Comments: pt tolerated sitting x 5 but required maxA to maintain balance/upright posture                                    Cognition Arousal/Alertness: Lethargic;Awake/alert Behavior During Therapy: Flat affect                                          Exercises General Exercises - Lower Extremity Ankle Circles/Pumps: 10 reps Long Arc Quad: 10 reps Hip ABduction/ADduction: 10 reps    General Comments        Pertinent Vitals/Pain Pain Assessment: Faces Faces Pain Scale: Hurts even more Pain Intervention(s): Limited activity within patient's tolerance;Monitored during session;Premedicated before session;Repositioned    Home Living                      Prior Function            PT Goals (current goals can now be found in the care plan section) Acute Rehab PT Goals Patient Stated Goal: didn't state PT Goal Formulation: With family Time For Goal Achievement: 12/12/17 Potential to Achieve  Goals: Good Progress towards PT goals: Progressing toward goals    Frequency    Min 2X/week      PT Plan Current plan remains appropriate    Co-evaluation              AM-PAC PT "6 Clicks" Daily Activity  Outcome Measure  Difficulty turning over in bed (including adjusting bedclothes, sheets and blankets)?: Unable Difficulty moving from lying on back to sitting on the side of the bed? : Unable Difficulty sitting down on and standing up from a chair with arms (e.g., wheelchair, bedside commode, etc,.)?: Unable Help needed moving to and from a bed to chair (including a wheelchair)?: Total Help needed walking in hospital room?: Total Help needed climbing 3-5 steps with a railing? : Total 6 Click Score: 6    End of Session   Activity Tolerance: Patient limited by pain Patient left: in bed;with call bell/phone within reach;with bed alarm set Nurse Communication:  Mobility status PT Visit Diagnosis: Unsteadiness on feet (R26.81);Muscle weakness (generalized) (M62.81);Difficulty in walking, not elsewhere classified (R26.2)     Time: 4967-5916 PT Time Calculation (min) (ACUTE ONLY): 25 min  Charges:  $Therapeutic Activity: 8-22 mins                    G Codes:       Etta Grandchild, PT, DPT Acute Rehab Services Pager: [phone]     Etta Grandchild 11/30/2017, 4:33 PM

## 2017-11-30 NOTE — Care Management Important Message (Signed)
Important Message  Patient Details  Name: [name] MRN: [id number] Date of Birth: June 22, 1946   Medicare Important Message Given:  Yes    Elliot Cousin, RN 11/30/2017, 5:02 PM

## 2017-12-01 LAB — POTASSIUM: Potassium: 3.8 mmol/L (ref 3.5–5.1)

## 2017-12-01 LAB — GLUCOSE, CAPILLARY
GLUCOSE-CAPILLARY: 170 mg/dL — AB (ref 65–99)
GLUCOSE-CAPILLARY: 158 mg/dL — AB (ref 65–99)
Glucose-Capillary: 148 mg/dL — ABNORMAL HIGH (ref 65–99)
Glucose-Capillary: 161 mg/dL — ABNORMAL HIGH (ref 65–99)
Glucose-Capillary: 93 mg/dL (ref 65–99)
Glucose-Capillary: 139 mg/dL — ABNORMAL HIGH (ref 65–99)

## 2017-12-01 LAB — CBC
HEMATOCRIT: 30.4 % — AB (ref 36.0–46.0)
Hemoglobin: 9.7 g/dL — ABNORMAL LOW (ref 12.0–15.0)
MCH: 28.7 pg (ref 26.0–34.0)
MCHC: 31.9 g/dL (ref 30.0–36.0)
MCV: 89.9 fL (ref 78.0–100.0)
Platelets: 332 10*3/uL (ref 150–400)
RBC: 3.38 MIL/uL — AB (ref 3.87–5.11)
RDW: 16.9 % — ABNORMAL HIGH (ref 11.5–15.5)
WBC: 11.9 10*3/uL — AB (ref 4.0–10.5)

## 2017-12-01 MED ORDER — PRO-STAT SUGAR FREE PO LIQD
30.0000 mL | Freq: Two times a day (BID) | ORAL | Status: DC
  Administered 2017-12-01 – 2017-12-04 (×2): 30 mL via ORAL
  Filled 2017-12-01 (×3): qty 30

## 2017-12-01 NOTE — Progress Notes (Addendum)
[facility]   Subjective  - Alert and doing better over all.  No appetite.   Objective 91/72 78 (!) 96.3 F (35.7 C) (Axillary) 18 100%  Intake/Output Summary (Last 24 hours) at 12/01/2017 0722 Last data filed at 12/01/2017 0704 Gross per 24 hour  Intake 750 ml  Output 1450 ml  Net -700 ml    Doppler signal left peroneal.  Tip of toes with dry gangrene.  Minimal ankle active range of motion. Right groin softer, ecchymosis Sacral wound- Measurement: 10cm x 9cm x 0.1cm followed by wound care team. Left hand with cool dusky fingers, active range of motion intact    Assessment/Planning: 11/03/2017 Evacuation and exploration of right groin with closure of puncture of right femoral graft.  PCA for pain No appetite will continue to encourage PO intake. S/P cardiac arrest with angioplasty and stent placement on 11/13/2017.  S/P intubation and required pressors.  Toes and fingers affected, now in recovery.   Sacral wound with management assistants by wound care team.     [name] 12/01/2017 7:22 AM --  Laboratory Lab Results: Recent Labs    11/30/17 0830 12/01/17 0349  WBC 12.8* 11.9*  HGB 8.6* 9.7*  HCT 27.2* 30.4*  PLT 303 332   BMET Recent Labs    11/29/17 0926 11/30/17 0835 11/30/17 1733 12/01/17 0349  NA 147* 143  --   --   K 3.1* 3.0* 3.1* 3.8  CL 114* 113*  --   --   CO2 23 23  --   --   GLUCOSE 202* 155*  --   --   BUN 25* 19  --   --   CREATININE 1.18* 1.00  --   --   CALCIUM 7.5* 7.3*  --   --     COAG Lab Results  Component Value Date   INR 1.60 (H) 12/11/2015   INR 1.07 08/19/2015   INR 1.01 06/05/2015   No results found for: PTT  I have examined the patient, reviewed and agree with above.  Continued slow improvement.  Had a long discussion with the patient and her daughter present.  Multiple concerns.  Her right lower quadrant pain has markedly improved.  She did have a massive hematoma  between the level of the fascia and the skin.  She does have skin breakdown as expected in the right groin incision.  This appears to be limited to the skin currently with no evidence of drainage.  We will keep this dry.  I did discuss the concern of aortobifemoral bypass limb at the base of the wound and the devastating consequences of graft infection.  She continues to have ischemia of both hands which is chronic.  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  We will continue to observe.  She denies any pain associated with this.  Left leg doing well.  Well-perfused and does have dry gangrenous changes on the tips of her toes.  Sacral decubitus per wound care team.  Very poor nutritional intake.  Has resolved the nausea and vomiting and GI bleed from earlier this week.  No further nausea and is eating very little.  Encouraged her to continue to increase his intake and explained that we would place a feeding tube if her nutritional intake is not improved over the next several days.  Dr. Edilia Bo will see the patient over the weekend.  Dr. Myra Gianotti will assume her care next  week in my absence and I explained this to the patient and her daughter as well  Gretta Began, MD 12/01/2017 9:38 AM

## 2017-12-01 NOTE — Progress Notes (Signed)
Initial Nutrition Assessment  DOCUMENTATION CODES:   Obesity unspecified  INTERVENTION:    Prostat liquid protein po 30 ml BID with meals, each supplement provides 100 kcal, 15 grams protein  NUTRITION DIAGNOSIS:   Increased nutrient needs related to chronic illness, wound healing as evidenced by estimated needs  GOAL:   Patient will meet greater than or equal to 90% of their needs  MONITOR:   PO intake, Supplement acceptance, Labs, Weight trends, Skin, I & O's  REASON FOR ASSESSMENT:   Low Braden  ASSESSMENT:   72 yo Female who underwent left superficial femoral and profundofemoral arterial angiography, angioplasty and then stent placeme.nt 5/21.  Course complicated by pseudoaneurysm, acute hemorrhage and associated hemorrhagic shock.  RD spoke with pt at bedside. She is sleepy. Eyes opening and closing. Reports her appetite isn't good. Doesn't remember what she had for breakfast. Pt typically consumes 1 meal per day at home. It's usually something sweet like "coffee cake".  Pt s/p bedside swallow evaluation 5/25. She is at low to moderate aspiration risk. CWOCN note reviewed. Pt with DTI to sacrum. On a low air matrress. Low braden score places pt at risk for further skin breakdown.  Pt is not interested in Ensure and/or Boost supplements. Labs and medications reviewed. K 3.1 (L). CBG's 148-<10-170.  NUTRITION - FOCUSED PHYSICAL EXAM:  Deferred at this time. Pt in and out of sleep.  Diet Order:   Diet Order           DIET DYS 3 Room service appropriate? Yes; Fluid consistency: Thin  Diet effective now          EDUCATION NEEDS:   No education needs have been identified at this time  Skin:  Skin Assessment: Skin Integrity Issues: Skin Integrity Issues:: DTI DTI: sacrum  Last BM:  5/31  Height:   Ht Readings from Last 1 Encounters:  11/27/2017 5\' 4"  (1.626 m)    Weight:   Wt Readings from Last 1 Encounters:  11/30/17 175 lb 4.3 oz (79.5 kg)     Ideal Body Weight:  54.5 kg  BMI:  Body mass index is 30.08 kg/m.  Estimated Nutritional Needs:   Kcal:  1800-2000  Protein:  100-115 gm  Fluid:  1.8-2.0 L  Maureen Chatters, RD, LDN Pager #: [phone] After-Hours Pager #: [phone]

## 2017-12-02 ENCOUNTER — Inpatient Hospital Stay (HOSPITAL_COMMUNITY): Payer: Medicare Other

## 2017-12-02 ENCOUNTER — Inpatient Hospital Stay: Payer: Self-pay

## 2017-12-02 LAB — GLUCOSE, CAPILLARY
GLUCOSE-CAPILLARY: 153 mg/dL — AB (ref 65–99)
GLUCOSE-CAPILLARY: 171 mg/dL — AB (ref 65–99)
GLUCOSE-CAPILLARY: 135 mg/dL — AB (ref 65–99)
Glucose-Capillary: 129 mg/dL — ABNORMAL HIGH (ref 65–99)
Glucose-Capillary: 167 mg/dL — ABNORMAL HIGH (ref 65–99)

## 2017-12-02 MED ORDER — TRAVASOL 10 % IV SOLN: INTRAVENOUS | Status: DC

## 2017-12-02 MED ORDER — DEXTROSE-NACL 5-0.45 % IV SOLN: INTRAVENOUS | Status: DC

## 2017-12-02 MED ORDER — TRAVASOL 10 % IV SOLN
INTRAVENOUS | Status: AC
  Administered 2017-12-02: 18:00:00 via INTRAVENOUS
  Filled 2017-12-02: qty 528

## 2017-12-02 NOTE — Progress Notes (Addendum)
PHARMACY - ADULT TOTAL PARENTERAL NUTRITION CONSULT NOTE    Pharmacy Consult for TPN Indication: Persistent Ileus and inability to tolerate PO  Patient Measurements: Height: 5\' 4"  (162.6 cm) Weight: 175 lb 4.3 oz (79.5 kg) IBW/kg (Calculated) : 54.7 TPN AdjBW (KG): 59.2 Body mass index is 30.08 kg/m.  Assessment:  [name] presents for vascular surgery on 5/21 for PVD. Doing well postop and ready for discharge but then had pseudoaneurysm in R groin and became diaphoretic and hypotensive. She was intubated and taken to OR emergently for evac and exploration of R groin. Has been NPO or on clear liquid for over a week and just now trying to advance diet however not tolerating well and abdominal Xray shows dilated small bowel loops indicating possible ileus. Vascular reluctant to trial TFs with a persistent ileus and risk of aspiration. Pharmacy consulted to start TPN.   GI: Not taking much in with dysphagia diet 3. Last BM was 6/1 although ileus on abdominal xray. Not drinking Glucerna shakes or pro-stats. Endo: Hx of DM. CBGs ok (130-170s) over last 24 hrs. Insulin requirements in the past 24 hours: 14 units of SSI Lytes: wnl on 5/30 and K up to 3.8 on 5/31 after replacement. No new labs today. Will check tomorrow Renal: D5 at 26ml/hr Pulm: RA Cards:  Hepatobil: Follow up labs tomorrow Neuro: ID: Afebrile, WBC 11.9.  TPN Access: CVC Double Lumen TPN start date: 5/31 >> Nutritional Goals (per RD recommendation on 5/31): KCal: 1800-2000 Protein: 100-115 g Fluid: 1.8-2.0 L  Goal TPN rate is 83 ml/hr (provides 110 g of protein and 1,853 kcal)  Current Nutrition:  Dysphagia Diet 3 + Prostat (15 g of protein and 100 kcal each)   Plan:  Start TPN at 40 mL/hr. This TPN provides 53 g of protein, 144 g of dextrose, and 21 g of lipids which provides 893 kCals per day, meeting ~50% of patient needs. Will advance TPN to goal as tolerated Electrolytes in TPN: standard concentration; Cl:Ac  1:1 Add MVI, trace elements to TPN Continue moderate SSI  Q4h and adjust as needed Decrease D5 1/2NS to [facility] tonight when TPN starts Monitor TPN labs F/U improvement in ileus, ability to tolerate PO diet  Yvonne Petite J 12/02/2017,12:28 PM

## 2017-12-02 NOTE — Progress Notes (Signed)
Pt son upset that pt could not order pancakes because it had eggs in it.  Son states that patient is not allergic to eggs but does not like eggs. He wanted allergy to eggs removed from profile. Removed from profile and called dietary to make aware while son waited. Thomas Hoff, RN

## 2017-12-02 NOTE — Progress Notes (Addendum)
[facility] [place]  Subjective  - Alert this am now new complaints.  States she ate more last night.  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 Sacral wound- managed by wound care nurse Groin soft, healing  Assessment/Planning: 11/17/2017 Evacuation and exploration of right groin with closure of puncture of right femoral graft.  Continue to encourage PO intake, if no improvement may need nutritional support via feeding tube. Ischemia to fingers and toes resolving since off pressors.   Continue dressing changes to right groin. Will order CBC, BMET in am  [name] 12/02/2017 7:41 AM   I have interviewed the patient and examined the patient. I agree with the findings by the PA. Currently the right groin wound is intact.  She has significant ecchymosis here. Slow progress. Will need tube feeds if her p.o. intake does not improve.  Cari Caraway, MD [phone]

## 2017-12-02 NOTE — Progress Notes (Signed)
Patient refused scheduled mouth wash, glucerna and prostat, patient educated she verbalized understanding, patient's daughter at bedside aware that the patient refused her meds, will continue to monitor.

## 2017-12-02 NOTE — Progress Notes (Signed)
VASCULAR SURGERY ADDENDUM:  I updated the patient, her husband, and her daughter.  She had 2 episodes of nausea this morning.  Abdomen is quiet with hypoactive bowel sounds.  I checked an abdominal x-ray which shows persistent ileus.   I think the patient clearly needs nutrition.  I am reluctant to start tube feeds given her persistent ileus with some nausea and risk for aspiration.  This reason I will start TNA and have a PICC line inserted.  I have discussed this with the patient and they are agreeable with this plan.  Waverly Ferrari, MD, FACS Beeper [phone] Office: [phone]

## 2017-12-02 NOTE — Progress Notes (Signed)
Pt monitor alarming and this RN went to change battery. [name] was in room with son questioning her about PICC placement. I spoke with son concerning PICC placement.  I explained that interventional radiology would place line during regular hours as this was not STAT. Pt has left IJ which can be used for TPN. I spoke with RN [name] who has explained this to patient and son already. Son upset that we do not have a rounding PICC team? Son questioning patient being repositioned and wanted me to check documentation. I explained that I would let RN know his concerns but patient repositioned herself from back to left side while I was in room. Son did not want to accept explanation and wanted my name because he knows "Benjie" on the board of directors. He states he has been in room 9 hours and staff have not been in room every 2 hours turning her. I spoke with patient concerning positioning, pain and if she knew if she was wet (on purewick). Patient states she can tell. She was not in pain, did not need to be repositioned and did not need to void. Pt pushed button on PCA instead of nurse button on call bell. I explained to son that he could let us know if he was in the room otherwise we would check on her more frequently. Son wanted my "card" and my name. I explained that I could get someone in leadership to come and speak to him. He again states that he will call "Benjie" on board of directors, Dr. Edilia Bo and Dr. Arbie Cookey and have them take care of it. Thomas Hoff, RN

## 2017-12-02 NOTE — Progress Notes (Signed)
Received a call form IV team that they can not place a PICC for the patient, they recommended that the patient get her PICC line at IR, Edilia Bo MD notified, verbal order received from [facility] MD to get PICC line placed at IR, patient's son updated, will continue to monitor.

## 2017-12-02 DEATH — deceased

## 2017-12-03 LAB — CBC
HEMATOCRIT: 26.9 % — AB (ref 36.0–46.0)
HEMOGLOBIN: 8.5 g/dL — AB (ref 12.0–15.0)
MCH: 29.1 pg (ref 26.0–34.0)
MCHC: 31.6 g/dL (ref 30.0–36.0)
MCV: 92.1 fL (ref 78.0–100.0)
Platelets: 284 10*3/uL (ref 150–400)
RBC: 2.92 MIL/uL — AB (ref 3.87–5.11)
RDW: 16.3 % — AB (ref 11.5–15.5)
WBC: 9.9 10*3/uL (ref 4.0–10.5)

## 2017-12-03 LAB — GLUCOSE, CAPILLARY
GLUCOSE-CAPILLARY: 152 mg/dL — AB (ref 65–99)
GLUCOSE-CAPILLARY: 183 mg/dL — AB (ref 65–99)
GLUCOSE-CAPILLARY: 183 mg/dL — AB (ref 65–99)
Glucose-Capillary: 202 mg/dL — ABNORMAL HIGH (ref 65–99)
Glucose-Capillary: 195 mg/dL — ABNORMAL HIGH (ref 65–99)
Glucose-Capillary: 171 mg/dL — ABNORMAL HIGH (ref 65–99)

## 2017-12-03 LAB — COMPREHENSIVE METABOLIC PANEL
ALBUMIN: 1.2 g/dL — AB (ref 3.5–5.0)
ALT: 25 U/L (ref 14–54)
AST: 19 U/L (ref 15–41)
Alkaline Phosphatase: 70 U/L (ref 38–126)
Anion gap: 8 (ref 5–15)
BILIRUBIN TOTAL: 0.7 mg/dL (ref 0.3–1.2)
BUN: 19 mg/dL (ref 6–20)
CO2: 24 mmol/L (ref 22–32)
CREATININE: 0.81 mg/dL (ref 0.44–1.00)
Calcium: 7.2 mg/dL — ABNORMAL LOW (ref 8.9–10.3)
Chloride: 109 mmol/L (ref 101–111)
GFR calc Af Amer: >60 mL/min (ref >60)
GFR calc non Af Amer: >60 mL/min (ref >60)
GLUCOSE: 193 mg/dL — AB (ref 65–99)
Potassium: 3 mmol/L — ABNORMAL LOW (ref 3.5–5.1)
Sodium: 141 mmol/L (ref 135–145)
TOTAL PROTEIN: 4.2 g/dL — AB (ref 6.5–8.1)

## 2017-12-03 LAB — MAGNESIUM: MAGNESIUM: 1.1 mg/dL — AB (ref 1.7–2.4)

## 2017-12-03 LAB — PHOSPHORUS: PHOSPHORUS: 3 mg/dL (ref 2.5–4.6)

## 2017-12-03 LAB — PREALBUMIN: Prealbumin: <5 mg/dL — ABNORMAL LOW (ref 18–38)

## 2017-12-03 MED ORDER — TRAVASOL 10 % IV SOLN
INTRAVENOUS | Status: AC
  Administered 2017-12-03: 18:00:00 via INTRAVENOUS
  Filled 2017-12-03: qty 792

## 2017-12-03 MED ORDER — POTASSIUM CHLORIDE 10 MEQ/50ML IV SOLN
10.0000 meq | INTRAVENOUS | Status: AC
  Administered 2017-12-03 (×6): 10 meq via INTRAVENOUS
  Filled 2017-12-03 (×6): qty 50

## 2017-12-03 MED ORDER — POTASSIUM CHLORIDE 20 MEQ/15ML (10%) PO SOLN
40.0000 meq | Freq: Once | ORAL | Status: DC
  Filled 2017-12-03: qty 30

## 2017-12-03 MED ORDER — MAGNESIUM SULFATE 4 GM/100ML IV SOLN
4.0000 g | Freq: Once | INTRAVENOUS | Status: AC
Start: 2017-12-03 — End: 2017-12-03
  Administered 2017-12-03: 4 g via INTRAVENOUS
  Filled 2017-12-03 (×4): qty 100

## 2017-12-03 MED ORDER — POTASSIUM CHLORIDE 10 MEQ/50ML IV SOLN
10.0000 meq | INTRAVENOUS | Status: DC
  Filled 2017-12-03 (×2): qty 50

## 2017-12-03 NOTE — Progress Notes (Addendum)
[facility] [place]  Subjective  - Attemping to increase PO intake.  Objective 91/72 78 98.1 F (36.7 C) (Oral) 18 100%  Intake/Output Summary (Last 24 hours) at 12/03/2017 0746 Last data filed at 12/03/2017 0700 Gross per 24 hour  Intake 1156 ml  Output 1600 ml  Net -444 ml   Alert and doing better this am. Fingers cool to touch with active range of motion intact dusky at baseline. 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  Left foot warm with doppler signals PT/peroneal. Right groin with residual ecchymosis, soft resolving hematoma.  Dry dressing in place.  Daily dressing changes to right groin to protect from incisional breakdown.   TPN nutrition IV in place.  Abdomin with hypo bowel sounds.  Assessment/Planning: 5/22/2019Evacuation and exploration of right groin with closure of puncture of right femoral graft.  Tolerating TPN with out N/V since yesterday.  States she ate a little yesterday as well.   Hypokalemia 3.0 will hopefully correct with TPN.  Will observe.   Continue current treatment. Future plan discharge to SNF once stable.  [name] 12/03/2017 7:46 AM  Laboratory Lab Results: Recent Labs    12/01/17 0349 12/03/17 0455  WBC 11.9* 9.9  HGB 9.7* 8.5*  HCT 30.4* 26.9*  PLT 332 284   I have interviewed the patient and examined the patient. I agree with the findings by the PA. NUTRITION: She still has some ileus based on her x-ray yesterday and physical exam.  She also had nausea yesterday but this has improved.  This reason I started her on TNA as I was worried about the risk of aspiration if we started tube feeds.  Hopefully, her p.o. intake will improve and ultimately she can be weaned off of TNA and not require a feeding tube. The right groin wound is still of concern.  She will need continued meticulous wound care.  Cari Caraway, MD [phone]

## 2017-12-03 NOTE — Plan of Care (Signed)
Patient is making progress towards goals but it is very slow going. Patient given TPN through central line d/t poor nutritional intake. Peripheral pulses still weak in upper and lower extremities. Abdomen firm and tender on right side with extensive bruising. Patient using PCA pump. Reports no pain during rounding.

## 2017-12-03 NOTE — Progress Notes (Addendum)
PHARMACY - ADULT TOTAL PARENTERAL NUTRITION CONSULT NOTE    Pharmacy Consult for TPN Indication: Persistent Ileus and inability to tolerate PO  Patient Measurements: Height: 5\' 4"  (162.6 cm) Weight: 175 lb 4.3 oz (79.5 kg) IBW/kg (Calculated) : 54.7 TPN AdjBW (KG): 59.2 Body mass index is 30.08 kg/m.  Assessment:  [name] presents for vascular surgery on 5/21 for PVD. Was doing well postop and ready for discharge but then had pseudoaneurysm / hematoma in R groin and became diaphoretic and hypotensive. She was intubated and taken to OR emergently for evac and exploration of R groin. Has been NPO or on clear liquid for over a week and just now trying to advance diet however not tolerating well and abdominal Xray shows dilated small bowel loops indicating possible ileus. Vascular reluctant to trial TFs with a persistent ileus and risk of aspiration. Pharmacy consulted to start TPN.   GI: Not taking much in with dysphagia diet 3. Last BM was 6/1 although ileus on abdominal xray. Refusing Glucerna shakes or pro-stats. Vascular encouraging PO intake but still not eating much. Ileus does seem to be resolving. Albumin low at 1.2, prealbumin < 5. PPI Endo: Hx of DM. CBGs ok (120-190s) over last 24 hrs. Insulin requirements in the past 24 hours: 16 units of SSI Lytes: K down to 3.0 this am, Mg low at 1.1. May be exhibiting some refeeding signs. Other lytes wnl. Renal: SCr stable. Pulm: RA Cards:  Hepatobil: LFTs wnl. Tbili ok. Trig on 6/3 Neuro: Morphine PCA. Pain score 0-5. GCS 15 ID: Afebrile, WBC 9.9  TPN Access: CVC Double Lumen TPN start date: 5/31 >> Nutritional Goals (per RD recommendation on 5/31): KCal: 1800-2000 Protein: 100-115 g Fluid: 1.8-2.0 L  Goal TPN rate is 83 ml/hr (provides 110 g of protein and 1,853 kcal)  Current Nutrition:  Dysphagia Diet 3 + Prostat (15 g of protein and 100 kcal each)  TPN  Plan:  Increase TPN to 60 mL/hr This TPN provides 79 g of protein, 216  g of dextrose, and 29 g of lipids which provides 1,339 kCals per day, meeting ~70% of patient needs. Will advance TPN to goal as tolerated and if poor PO intake remains. Electrolytes in TPN: standard concentration exc increase Mg to 10mEq/L; Cl:Ac 1:1 Add MVI, trace elements, and 20 units of regular insulin to TPN Continue moderate SSI Q4h and adjust as needed Stop D51/2NS Monitor TPN labs F/U improvement in ileus, ability to tolerate PO diet  Give Mg 4g IV x 1 Give potassium solution PO x 1 and 3 runs of KCl IV  ADDENDUM: Patient refused PO potassium. Will give 6 runs of KCl IV instead  Enzo Bi, PharmD, BCPS Clinical Pharmacist Phone number [phone] 12/03/2017 7:45 AM

## 2017-12-04 ENCOUNTER — Other Ambulatory Visit: Payer: Self-pay

## 2017-12-04 DIAGNOSIS — Z48812 Encounter for surgical aftercare following surgery on the circulatory system: Secondary | ICD-10-CM

## 2017-12-04 DIAGNOSIS — I779 Disorder of arteries and arterioles, unspecified: Secondary | ICD-10-CM

## 2017-12-04 LAB — GLUCOSE, CAPILLARY
GLUCOSE-CAPILLARY: 123 mg/dL — AB (ref 65–99)
GLUCOSE-CAPILLARY: 150 mg/dL — AB (ref 65–99)
GLUCOSE-CAPILLARY: 156 mg/dL — AB (ref 65–99)
GLUCOSE-CAPILLARY: 166 mg/dL — AB (ref 65–99)
GLUCOSE-CAPILLARY: 167 mg/dL — AB (ref 65–99)

## 2017-12-04 LAB — CBC
HEMATOCRIT: 26.6 % — AB (ref 36.0–46.0)
HEMOGLOBIN: 8.4 g/dL — AB (ref 12.0–15.0)
MCH: 28.8 pg (ref 26.0–34.0)
MCHC: 31.6 g/dL (ref 30.0–36.0)
MCV: 91.1 fL (ref 78.0–100.0)
Platelets: 293 10*3/uL (ref 150–400)
RBC: 2.92 MIL/uL — AB (ref 3.87–5.11)
RDW: 16.4 % — ABNORMAL HIGH (ref 11.5–15.5)
WBC: 8.5 10*3/uL (ref 4.0–10.5)

## 2017-12-04 LAB — DIFFERENTIAL
BASOS ABS: 0 10*3/uL (ref 0.0–0.1)
Basophils Relative: 0 %
EOS PCT: 1 %
Eosinophils Absolute: 0.1 10*3/uL (ref 0.0–0.7)
LYMPHS PCT: 7 %
Lymphs Abs: 0.6 10*3/uL — ABNORMAL LOW (ref 0.7–4.0)
MONOS PCT: 9 %
Monocytes Absolute: 0.8 10*3/uL (ref 0.1–1.0)
Neutro Abs: 7 10*3/uL (ref 1.7–7.7)
Neutrophils Relative %: 83 %

## 2017-12-04 LAB — PREALBUMIN

## 2017-12-04 LAB — TRIGLYCERIDES: Triglycerides: 141 mg/dL (ref <150)

## 2017-12-04 LAB — PHOSPHORUS: PHOSPHORUS: 2.7 mg/dL (ref 2.5–4.6)

## 2017-12-04 LAB — MAGNESIUM: Magnesium: 1.8 mg/dL (ref 1.7–2.4)

## 2017-12-04 LAB — COMPREHENSIVE METABOLIC PANEL
ALT: 21 U/L (ref 14–54)
AST: 19 U/L (ref 15–41)
Albumin: 1.2 g/dL — ABNORMAL LOW (ref 3.5–5.0)
Alkaline Phosphatase: 66 U/L (ref 38–126)
Anion gap: 4 — ABNORMAL LOW (ref 5–15)
BUN: 22 mg/dL — AB (ref 6–20)
CHLORIDE: 110 mmol/L (ref 101–111)
CO2: 24 mmol/L (ref 22–32)
CREATININE: 0.75 mg/dL (ref 0.44–1.00)
Calcium: 7.3 mg/dL — ABNORMAL LOW (ref 8.9–10.3)
GFR calc Af Amer: >60 mL/min (ref >60)
GFR calc non Af Amer: >60 mL/min (ref >60)
Glucose, Bld: 164 mg/dL — ABNORMAL HIGH (ref 65–99)
Potassium: 3.8 mmol/L (ref 3.5–5.1)
SODIUM: 138 mmol/L (ref 135–145)
Total Bilirubin: 0.5 mg/dL (ref 0.3–1.2)
Total Protein: 4.4 g/dL — ABNORMAL LOW (ref 6.5–8.1)

## 2017-12-04 LAB — GLUCOSE, RANDOM: GLUCOSE: 243 mg/dL — AB (ref 65–99)

## 2017-12-04 MED ORDER — TRAVASOL 10 % IV SOLN
INTRAVENOUS | Status: AC
  Administered 2017-12-04: 17:00:00 via INTRAVENOUS
  Filled 2017-12-04: qty 924

## 2017-12-04 NOTE — Progress Notes (Signed)
PHARMACY - ADULT TOTAL PARENTERAL NUTRITION CONSULT NOTE    Pharmacy Consult for TPN Indication: Persistent ileus and inability to tolerate PO  Patient Measurements: Height: 5\' 4"  (162.6 cm) Weight: 175 lb 4.3 oz (79.5 kg) IBW/kg (Calculated) : 54.7 TPN AdjBW (KG): 59.2 Body mass index is 30.08 kg/m.  Assessment:  [name] presents for vascular surgery on 5/21 for PVD. Was doing well postop and ready for discharge but then had pseudoaneurysm / hematoma in R groin and became diaphoretic and hypotensive. She was intubated and taken to OR emergently for evac and exploration of R groin. Has been NPO or on clear liquid for over a week and just now trying to advance diet however not tolerating well and abdominal Xray shows dilated small bowel loops indicating possible ileus. Vascular reluctant to trial TFs with a persistent ileus and risk of aspiration. Pharmacy consulted to start TPN.   GI: Not taking much in with dysphagia diet 3. Last BM was 6/1 although ileus on abdominal xray. Ileus does seem to be resolving.  Pt will try [place]. Albumin low at 1.2, prealbumin < 5. PPI IV, PRN Zofran Endo: DM - CBGs controlled Insulin requirements in the past 24 hours: 20 units SSI + 20 units in TPN Lytes: K 3.8 (goal >/= 4 for ileus), Mag 1.8 (goal >/= 2 for ileus), others WNL Renal: SCr stable, BUN 22 - UOP 0.9 ml/kg/hr Pulm: RA >> 3L Britt Cards: VSS - Lasix 20mg  IV Q12H, Lopressor Hepatobil: LFT / tbili / TG WNL Neuro: Morphine PCA - pain score 0 ID: Afebrile, WBC 9.9 - not on abx TPN Access: CVC double lumen >> replace with PICC TPN start date: 12/01/17  Nutritional Goals (per RD recommendation on 5/31): KCal: 1800-2000 Protein: 100-115 g Fluid: 1.8-2.0 L  Current Nutrition:  Dysphagia 3 diet = minimal intake Prostat BID (none administered yesterday) Glucerna shake TID (none administered yesterday) TPN   Plan:  Increase TPN to 70 mL/hr (goal rate 83 ml/hr).  Hold off on advancing to  goal to allow for PO intake. This TPN provides 92g AA, 252g CHO, and 34g ILE which provides 1562 kCals per day, meeting 90% of minimal protein and 87% of kCal needs. Electrolytes in TPN: incr K (96 mEq/d), incr Mag (20 mEq/d), Cl:Ac 1:1 Add MVI, trace elements, and 20 units of regular insulin to TPN Continue moderate SSI Q4h and adjust as needed Monitor TPN labs F/U improvement in ileus, ability to tolerate PO diet   [name] D. Laney Potash, PharmD, BCPS, BCCCP Pager:  [phone] 12/04/2017, 11:06 AM

## 2017-12-04 NOTE — Plan of Care (Signed)
  Problem: Nutrition: Goal: Adequate nutrition will be maintained Outcome: Progressing  Patient is making slow progress. Receiving TPN because she has a poor appetite. Will drink water or juice when mouth feels dry, but not much food.

## 2017-12-04 NOTE — Progress Notes (Signed)
    Subjective  - s/p left leg percutaneous revascularization with DES and PTA.  Patient developed significant bleed the morning following her angiogram, requiring operative explloration  She states she feels a little better today.  She is not having any further nausea.   Physical Exam:  She has a brisk left peroneal Doppler signal as well as a biphasic dorsalis pedis signal on the left.  The toes on the left are demarcated without evidence of infection.  They look significantly improved from the last time I saw them approximately 1 week ago.  Her hands have also made significant improvement.  There is a duskiness to the right index and middle finger otherwise her hands are now pink.  Some skin breakdown of the right groin.  No obvious infection.       Assessment/Plan:    Nutrition: TPN was started over the weekend.  This is currently going through a central line which has been in place for 2 weeks.  I spoke with radiology this morning, and they plan on placing a dual lumen PICC line.  Once the TPN has been converted to the PICC line, the central line will be removed.  I discussed the importance of enteral nutrition.  The patient is going to try to eat a biscuit from [employer] today.  GI bleed: No active evidence of bleed.  Continue Protonix.  Antiplatelet medication is on hold.  SCDs for DVT prophylaxis    [name] 12/04/2017 9:13 AM --  Vitals:   12/04/17 0405 12/04/17 0800  BP:    Pulse:    Resp: 20 18  Temp:    SpO2: 100% 100%    Intake/Output Summary (Last 24 hours) at 12/04/2017 0913 Last data filed at 12/04/2017 0600 Gross per 24 hour  Intake 2058.67 ml  Output 1600 ml  Net 458.67 ml     Laboratory CBC    Component Value Date/Time   WBC 8.5 12/04/2017 0416   HGB 8.4 (L) 12/04/2017 0416   HCT 26.6 (L) 12/04/2017 0416   HCT 27.2 (L) 12/14/2015 0445   PLT 293 12/04/2017 0416    BMET    Component Value Date/Time   NA 138 12/04/2017 0416   K 3.8  12/04/2017 0416   CL 110 12/04/2017 0416   CO2 24 12/04/2017 0416   GLUCOSE 164 (H) 12/04/2017 0416   BUN 22 (H) 12/04/2017 0416   CREATININE 0.75 12/04/2017 0416   CALCIUM 7.3 (L) 12/04/2017 0416   GFRNONAA >60 12/04/2017 0416   GFRAA >60 12/04/2017 0416    COAG Lab Results  Component Value Date   INR 1.60 (H) 12/11/2015   INR 1.07 08/19/2015   INR 1.01 06/05/2015   No results found for: PTT  Antibiotics Anti-infectives (From admission, onward)   Start     Dose/Rate Route Frequency Ordered Stop   11/23/17 1000  sulfamethoxazole-trimethoprim (BACTRIM,SEPTRA) 400-80 MG per tablet 1 tablet  Status:  Discontinued     1 tablet Oral 2 times daily 11/23/17 0800 11/23/17 1110   11/23/17 0100  vancomycin (VANCOCIN) IVPB 1000 mg/200 mL premix     1,000 mg 200 mL/hr over 60 Minutes Intravenous Every 12 hours 2017-12-14 1447 11/23/17 1705       V. Charlena Cross, M.D. [facility] [place] Office: [phone] Pager:  [phone]

## 2017-12-04 NOTE — Progress Notes (Addendum)
   Patient Status: [facility] - In-pt  Assessment and Plan: Patient in need of venous access.   Peripherally Inserted central catheter placement Nutrition; access for medications  ______________________________________________________________________   History of Present Illness: [place] Haddox is a 72 y.o. female   11/13/2017 Evacuation and exploration of right groin with closure of puncture of right femoral graft. Poor appetite Slow to heal   Allergies and medications reviewed.   Review of Systems: A 12 point ROS discussed and pertinent positives are indicated in the HPI above.  All other systems are negative.    Vital Signs: BP 91/72   Pulse (!) 106   Temp 97.7 F (36.5 C) (Oral)   Resp 20   Ht 5\' 4"  (1.626 m)   Wt 175 lb 4.3 oz (79.5 kg)   SpO2 100%   BMI 30.08 kg/m   Physical Exam  Constitutional: She is oriented to person, place, and time.  Pulmonary/Chest: Effort normal and breath sounds normal.  Neurological: She is alert and oriented to person, place, and time.  Skin: Skin is warm.  Psychiatric: She has a normal mood and affect. Her behavior is normal.  Pt did not have eye glasses in room Husband signed consent-- bedside  Nursing note and vitals reviewed.    Imaging reviewed.   Labs:  COAGS: No results for input(s): INR, APTT in the last 8760 hours.  BMP: Recent Labs    11/29/17 0926 11/30/17 0835 11/30/17 1733 12/01/17 0349 12/03/17 0455 12/04/17 0416  NA 147* 143  --   --  141 138  K 3.1* 3.0* 3.1* 3.8 3.0* 3.8  CL 114* 113*  --   --  109 110  CO2 23 23  --   --  24 24  GLUCOSE 202* 155*  --   --  193* 164*  BUN 25* 19  --   --  19 22*  CALCIUM 7.5* 7.3*  --   --  7.2* 7.3*  CREATININE 1.18* 1.00  --   --  0.81 0.75  GFRNONAA 45* 55*  --   --  >60 >60  GFRAA 52* >60  --   --  >60 >60       Electronically Signed: Alexx Mcburney A, PA-C 12/04/2017, 7:02 AM   I spent a total of 15 minutes in face to face in clinical consultation,  greater than 50% of which was counseling/coordinating care for venous access.Patient ID: [name], female   DOB: 11-08-1945, 72 y.o.   MRN: [id number]

## 2017-12-04 NOTE — Progress Notes (Signed)
  Speech Language Pathology Treatment: Dysphagia  Patient Details Name: [name] MRN: [id number] DOB: 09-01-1945 Today's Date: 12/04/2017 Time: 1638-4665 SLP Time Calculation (min) (ACUTE ONLY): 14 min  Assessment / Plan / Recommendation Clinical Impression  Patient seen while breakfast tray present. Pt had limited choices available. She was able to tolerate biscuit, apple sauce and liquids with no s/s of aspiration. Pt had slow but adequate mastication and bolus manipulation. She used liquids wash to swallow biscuit. Patient expressed not wanting a diet upgrade. She feels "she will not be able to chew a regular diet.  Continue Dysphagia 3, thin liquid diet. Monitor of diet upgrade.   HPI HPI: 72 year old woman who underwent left superficial femoral and profundofemoral arterial angiography, angioplasty and then stent placement 5/21.  Course complicated by pseudoaneurysm, acute hemorrhage and associated hemorrhagic shock.  She developed PEA and required brief resuscitation.  She required volume resuscitation, pressors.  She went back to the operating room for exploration and a small defect and her graft was repaired.  She was able to be extubated 5/22. Low-dose morphine PCA for pain. Swallow evaluation ordered due to RN concerns for safety with POs due to decreased alertness.      SLP Plan  Continue with current plan of care       Recommendations  Diet recommendations: Dysphagia 3 (mechanical soft) Liquids provided via: Cup;Straw Medication Administration: Whole meds with liquid Supervision: Patient able to self feed;Intermittent supervision to cue for compensatory strategies Compensations: Slow rate;Small sips/bites;Minimize environmental distractions Postural Changes and/or Swallow Maneuvers: Seated upright 90 degrees;Upright 30-60 min after meal                Plan: Continue with current plan of care       GO                Lindalou Hose Hagar Sadiq, MA, CCC-SLP 12/04/2017  9:54 AM

## 2017-12-04 NOTE — Progress Notes (Addendum)
Nutrition Consult/Follow Up  DOCUMENTATION CODES:   Obesity unspecified  INTERVENTION:    TPN per pharmacy  Consider transition to post pyloric feeding via 10 F Cortrak tube  NUTRITION DIAGNOSIS:   Increased nutrient needs related to chronic illness, wound healing as evidenced by estimated needs, ongoing  GOAL:   Patient will meet greater than or equal to 90% of their needs, progressing  MONITOR:   PO intake, Supplement acceptance, Labs, Weight trends, Skin, I & O's  ASSESSMENT:   72 yo Female who underwent left superficial femoral and profundofemoral arterial angiography, angioplasty and then stent placeme.nt 5/21.  Course complicated by pseudoaneurysm, acute hemorrhage and associated hemorrhagic shock.  Pt continues on Dys 3-thin liquid diet. PO intake very poor at 10% per flowsheet records. TPN was started 6/2 for persistent ileus and inability to tolerate PO.  PharmD to increase TPN to 70 ml/hr which provides 1562 kcals, 92 gm protein. Meets 90% of pt's minimal protein needs and 87% of minimal kcal needs. Labs and medications reviewed. PAB <5 (L). CBG's [phone].  Diet Order:   Diet Order           DIET DYS 3 Room service appropriate? Yes; Fluid consistency: Thin  Diet effective now         EDUCATION NEEDS:   No education needs have been identified at this time  Skin:  Skin Assessment: Skin Integrity Issues: Skin Integrity Issues:: DTI DTI: sacrum  Last BM:  6/2   Intake/Output Summary (Last 24 hours) at 12/04/2017 1309 Last data filed at 12/04/2017 0600 Gross per 24 hour  Intake 1818.67 ml  Output 1600 ml  Net 218.67 ml   Height:   Ht Readings from Last 1 Encounters:  11/11/2017 5\' 4"  (1.626 m)   Weight:   Wt Readings from Last 1 Encounters:  11/30/17 175 lb 4.3 oz (79.5 kg)   Ideal Body Weight:  54.5 kg  BMI:  Body mass index is 30.08 kg/m.  Estimated Nutritional Needs:   Kcal:  1800-2000  Protein:  100-115 gm  Fluid:  1.8-2.0  L  Maureen Chatters, RD, LDN Pager #: [phone] After-Hours Pager #: [phone]

## 2017-12-05 ENCOUNTER — Inpatient Hospital Stay (HOSPITAL_COMMUNITY): Payer: Medicare Other

## 2017-12-05 LAB — BASIC METABOLIC PANEL
Anion gap: 6 (ref 5–15)
BUN: 29 mg/dL — AB (ref 6–20)
CALCIUM: 7.8 mg/dL — AB (ref 8.9–10.3)
CO2: 25 mmol/L (ref 22–32)
Chloride: 107 mmol/L (ref 101–111)
Creatinine, Ser: 0.78 mg/dL (ref 0.44–1.00)
GFR calc Af Amer: >60 mL/min (ref >60)
Glucose, Bld: 163 mg/dL — ABNORMAL HIGH (ref 65–99)
POTASSIUM: 4.1 mmol/L (ref 3.5–5.1)
SODIUM: 138 mmol/L (ref 135–145)

## 2017-12-05 LAB — GLUCOSE, RANDOM
GLUCOSE: 190 mg/dL — AB (ref 65–99)
GLUCOSE: 148 mg/dL — AB (ref 65–99)
Glucose, Bld: 204 mg/dL — ABNORMAL HIGH (ref 65–99)
Glucose, Bld: 244 mg/dL — ABNORMAL HIGH (ref 65–99)
Glucose, Bld: 237 mg/dL — ABNORMAL HIGH (ref 65–99)

## 2017-12-05 LAB — PHOSPHORUS: Phosphorus: 3.1 mg/dL (ref 2.5–4.6)

## 2017-12-05 LAB — MAGNESIUM: MAGNESIUM: 1.8 mg/dL (ref 1.7–2.4)

## 2017-12-05 MED ORDER — ALTEPLASE 2 MG IJ SOLR
2.0000 mg | Freq: Once | INTRAMUSCULAR | Status: AC
  Administered 2017-12-05: 2 mg

## 2017-12-05 MED ORDER — TRAVASOL 10 % IV SOLN
INTRAVENOUS | Status: AC
  Administered 2017-12-05: 17:00:00 via INTRAVENOUS
  Filled 2017-12-05 (×4): qty 924

## 2017-12-05 MED ORDER — DRONABINOL 2.5 MG PO CAPS
2.5000 mg | ORAL_CAPSULE | Freq: Two times a day (BID) | ORAL | Status: DC
  Administered 2017-12-05 – 2017-12-10 (×11): 2.5 mg via ORAL
  Filled 2017-12-05 (×12): qty 1

## 2017-12-05 MED ORDER — LIDOCAINE HCL (PF) 1 % IJ SOLN
INTRAMUSCULAR | Status: DC | PRN
  Administered 2017-12-05: 8 mL

## 2017-12-05 MED ORDER — LIDOCAINE HCL 1 % IJ SOLN
INTRAMUSCULAR | Status: AC
  Filled 2017-12-05: qty 20

## 2017-12-05 NOTE — Clinical Social Work Note (Addendum)
CSW left voicemail for [facility] admissions coordinator to see if they can take patient on TPN.  Charlynn Court, CSW [phone]  2:23 pm Admissions coordinator spoke with SNF RN who stated that they have had patients with TPN in the past. Admissions coordinator will confirm with DON before giving CSW an official yes or no.  Charlynn Court, CSW [phone]

## 2017-12-05 NOTE — Plan of Care (Signed)
  Problem: Education: Goal: Knowledge of General Education information will improve Outcome: Progressing   Problem: Clinical Measurements: Goal: Will remain free from infection Outcome: Progressing Goal: Respiratory complications will improve Outcome: Progressing   Problem: Health Behavior/Discharge Planning: Goal: Ability to manage health-related needs will improve Outcome: Not Progressing   Problem: Activity: Goal: Risk for activity intolerance will decrease Outcome: Not Progressing   Problem: Nutrition: Goal: Adequate nutrition will be maintained Outcome: Not Progressing

## 2017-12-05 NOTE — Plan of Care (Signed)
  Problem: Clinical Measurements: Goal: Respiratory complications will improve Outcome: Progressing   Problem: Education: Goal: Knowledge of General Education information will improve Outcome: Not Progressing   Problem: Health Behavior/Discharge Planning: Goal: Ability to manage health-related needs will improve Outcome: Not Progressing   Problem: Clinical Measurements: Goal: Will remain free from infection Outcome: Not Progressing   Problem: Coping: Goal: Level of anxiety will decrease Outcome: Not Progressing   Problem: Elimination: Goal: Will not experience complications related to bowel motility Outcome: Not Progressing Goal: Will not experience complications related to urinary retention Outcome: Not Progressing

## 2017-12-05 NOTE — Progress Notes (Signed)
Physical Therapy Treatment Patient Details Name: [name] MRN: [id number] DOB: May 18, 1946 Today's Date: 12/05/2017    History of Present Illness [name] is a 72 y.o. female today for follow-up of diffuse peripheral vascular occlusive disease dating back over 20 years.  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  She has rest pain in her left foot with no new open ulceration. Pt s/p R femoral graft repair followed by bleeding    PT Comments    Pt admitted with above diagnosis. Pt currently with functional limitations due to strength deficits as well as medical issues limiting pt. Pt was able to assist with exercises in bed but limited by pain and poor motivation.  Will progress pt as able.   Pt will benefit from skilled PT to increase their independence and safety with mobility to allow discharge to the venue listed below.     Follow Up Recommendations  SNF;Supervision/Assistance - 24 hour     Equipment Recommendations  None recommended by PT    Recommendations for Other Services       Precautions / Restrictions Precautions Precautions: Fall Precaution Comments: L LE with necrosis at toes, right BKA, wound VAC Restrictions Weight Bearing Restrictions: No    Mobility  Bed Mobility                  Transfers                 General transfer comment: Pt had a bad night and has not been eating, wanted to exercise only  Ambulation/Gait             General Gait Details: pt non-ambulatory   Stairs             Wheelchair Mobility    Modified Rankin (Stroke Patients Only)       Balance                                            Cognition Arousal/Alertness: Lethargic;Awake/alert Behavior During Therapy: Flat affect Overall Cognitive Status: Impaired/Different from baseline                                  General Comments: pt with lethargy      Exercises General Exercises - Upper Extremity Shoulder Flexion: AROM;Both;10 reps;Supine Shoulder ABduction: AROM;Both;10 reps;Supine Elbow Flexion: AROM;Both;10 reps;Supine General Exercises - Lower Extremity Ankle Circles/Pumps: 10 reps;AROM;Left;Supine Quad Sets: AROM;Both;10 reps;Supine Hip ABduction/ADduction: 10 reps;AROM;Both;Supine Straight Leg Raises: AROM;Both;10 reps;Supine Hip Flexion/Marching: AROM;Both;10 reps;Supine    General Comments        Pertinent Vitals/Pain Pain Assessment: Faces Faces Pain Scale: Hurts worst Pain Location: L LE, right groin Pain Descriptors / Indicators: Grimacing Pain Intervention(s): Limited activity within patient's tolerance;Monitored during session;Repositioned;Premedicated before session;PCA encouraged(hit PCA x 2 )    Home Living                      Prior Function            PT Goals (current goals can now be found in the care plan section) Acute Rehab PT Goals Patient Stated Goal: didn't state Progress towards PT goals: Progressing toward goals(slowly due  to medical complications)    Frequency    Min 2X/week      PT Plan Current plan remains appropriate    Co-evaluation              AM-PAC PT "6 Clicks" Daily Activity  Outcome Measure  Difficulty turning over in bed (including adjusting bedclothes, sheets and blankets)?: Unable Difficulty moving from lying on back to sitting on the side of the bed? : Unable Difficulty sitting down on and standing up from a chair with arms (e.g., wheelchair, bedside commode, etc,.)?: Unable Help needed moving to and from a bed to chair (including a wheelchair)?: Total Help needed walking in hospital room?: Total Help needed climbing 3-5 steps with a railing? : Total 6 Click Score: 6    End of Session Equipment Utilized During Treatment: Oxygen Activity Tolerance: Patient limited by pain;Patient  limited by fatigue Patient left: in bed;with call bell/phone within reach;with bed alarm set;with family/visitor present Nurse Communication: Mobility status;Need for lift equipment PT Visit Diagnosis: Unsteadiness on feet (R26.81);Muscle weakness (generalized) (M62.81);Difficulty in walking, not elsewhere classified (R26.2)     Time: 2035-5974 PT Time Calculation (min) (ACUTE ONLY): 29 min  Charges:  $Therapeutic Exercise: 23-37 mins                    G Codes:       Faheem Ziemann,PT Acute Rehabilitation [phone] (pager)    Berline Lopes 12/05/2017, 11:48 AM

## 2017-12-05 NOTE — Progress Notes (Signed)
Spoke to RN regarding order to discontinue CVC. Pt currently getting TNA through this line. Discussed plan to discontinue CVC when new bag of TNA is due to prevent lapse in treatment. Next dose of TNA to be connected using PICC line. RN in agreement with this plan.

## 2017-12-05 NOTE — Progress Notes (Signed)
Wasted 3 ml morphine PCA with Otilio Connors, RN. remainder was flushed down the sink and syringe placed in sharps.

## 2017-12-05 NOTE — Procedures (Signed)
S/p RUE PICC  NO COMP STABLE EBL 0 TIP SVCRA READY

## 2017-12-05 NOTE — Progress Notes (Signed)
    Subjective  -   PICC placed this am Did not eat yesterday   Physical Exam:  No clincal change.  Color still good in extremities Right groin about the same       Assessment/Plan:    D/c central line once TPN converted to PICC Marinol started tod ay to help with appetite Continue TPN SW to assist with dispo to [employer] 12/05/2017 10:58 AM --  Vitals:   12/05/17 0946 12/05/17 1030  BP:    Pulse:    Resp:  17  Temp: 98 F (36.7 C)   SpO2:  100%    Intake/Output Summary (Last 24 hours) at 12/05/2017 1058 Last data filed at 12/05/2017 0713 Gross per 24 hour  Intake 702.33 ml  Output 3101 ml  Net -2398.67 ml     Laboratory CBC    Component Value Date/Time   WBC 8.5 12/04/2017 0416   HGB 8.4 (L) 12/04/2017 0416   HCT 26.6 (L) 12/04/2017 0416   HCT 27.2 (L) 12/14/2015 0445   PLT 293 12/04/2017 0416    BMET    Component Value Date/Time   NA 138 12/05/2017 0405   K 4.1 12/05/2017 0405   CL 107 12/05/2017 0405   CO2 25 12/05/2017 0405   GLUCOSE 163 (H) 12/05/2017 0405   BUN 29 (H) 12/05/2017 0405   CREATININE 0.78 12/05/2017 0405   CALCIUM 7.8 (L) 12/05/2017 0405   GFRNONAA >60 12/05/2017 0405   GFRAA >60 12/05/2017 0405    COAG Lab Results  Component Value Date   INR 1.60 (H) 12/11/2015   INR 1.07 08/19/2015   INR 1.01 06/05/2015   No results found for: PTT  Antibiotics Anti-infectives (From admission, onward)   Start     Dose/Rate Route Frequency Ordered Stop   11/23/17 1000  sulfamethoxazole-trimethoprim (BACTRIM,SEPTRA) 400-80 MG per tablet 1 tablet  Status:  Discontinued     1 tablet Oral 2 times daily 11/23/17 0800 11/23/17 1110   11/23/17 0100  vancomycin (VANCOCIN) IVPB 1000 mg/200 mL premix     1,000 mg 200 mL/hr over 60 Minutes Intravenous Every 12 hours 11/27/2017 1447 11/23/17 1705       V. Charlena Cross, M.D. [facility] Office: [phone] Pager:  [phone]

## 2017-12-05 NOTE — Progress Notes (Signed)
PHARMACY - ADULT TOTAL PARENTERAL NUTRITION CONSULT NOTE    Pharmacy Consult for TPN Indication: Persistent ileus and inability to tolerate PO  Patient Measurements: Height: 5\' 4"  (162.6 cm) Weight: 243 lb (110.2 kg) IBW/kg (Calculated) : 54.7 TPN AdjBW (KG): 59.2 Body mass index is 41.71 kg/m.  Assessment:  [name] presents for vascular surgery on 5/21 for PVD. Was doing well postop and ready for discharge but then had pseudoaneurysm / hematoma in R groin and became diaphoretic and hypotensive. She was intubated and taken to OR emergently for evac and exploration of R groin. Has been NPO or on clear liquid for over a week and just now trying to advance diet however not tolerating well and abdominal Xray shows dilated small bowel loops indicating possible ileus. Vascular reluctant to trial TFs with a persistent ileus and risk of aspiration. Pharmacy consulted to manage TPN.   GI: Ileus resolving, +BM.  Prealbumin < 5. PPI IV, PRN Zofran, starting dronabinol. Endo: DM - CBGs elevated with increased TPN rate Insulin requirements in the past 24 hours: 18 units SSI + 20 units in TPN Lytes: Mag 1.8 (goal >/= 2 for ileus), others WNL Renal: SCr stable, BUN 22 - UOP 0.8 ml/kg/hr Pulm: 4L [place] >> RA Cards: tachy - Lasix 20mg  IV Q12H, Lopressor Hepatobil: LFT / tbili / TG WNL Neuro: Morphine PCA - pain score 0 ID: Afebrile, WBC 9.9 - not on abx TPN Access: CVC double lumen >> replace with PICC TPN start date: 12/01/17  Nutritional Goals (per RD recommendation on 5/31): 1800-2000 kCal, 100-115 gm protein and 1.8-2 L fluid per day  Current Nutrition:  Dysphagia 3 diet = minimal intake TPN   Plan:  Continue TPN at 70 mL/hr (goal rate 83 ml/hr).  Hold off on advancing to goal to allow for PO intake. This TPN provides 92g AA, 252g CHO, and 34g ILE which provides 1562 kCals per day, meeting 90% of minimal protein and 87% of kCal needs. Electrolytes in TPN: K incr 6/3 (96 mEq/d), incr Mag (25  mEq/d), Cl:Ac 1:1 Add MVI and trace elements to TPN Continue moderate SSI Q4H + increase regular insulin in TPN to 30 units F/U PO intake and ability to wean off of TPN.  Consider a trial of enteral nutrition vs advancing and cycling TPN. Standard TPN labs on Thurs   [name], PharmD, BCPS, BCCCP Pager:  [phone] 12/05/2017, 11:33 AM

## 2017-12-06 LAB — COMPREHENSIVE METABOLIC PANEL
ALBUMIN: 1.1 g/dL — AB (ref 3.5–5.0)
ALK PHOS: 90 U/L (ref 38–126)
ALK PHOS: 93 U/L (ref 38–126)
ALT: 21 U/L (ref 14–54)
ALT: 23 U/L (ref 14–54)
ANION GAP: 8 (ref 5–15)
AST: 26 U/L (ref 15–41)
AST: 27 U/L (ref 15–41)
Albumin: 1.1 g/dL — ABNORMAL LOW (ref 3.5–5.0)
Anion gap: 6 (ref 5–15)
BILIRUBIN TOTAL: 0.4 mg/dL (ref 0.3–1.2)
BILIRUBIN TOTAL: 0.5 mg/dL (ref 0.3–1.2)
BUN: 33 mg/dL — ABNORMAL HIGH (ref 6–20)
BUN: 33 mg/dL — ABNORMAL HIGH (ref 6–20)
CALCIUM: 8 mg/dL — AB (ref 8.9–10.3)
CALCIUM: 8 mg/dL — AB (ref 8.9–10.3)
CO2: 27 mmol/L (ref 22–32)
CO2: 25 mmol/L (ref 22–32)
CREATININE: 0.76 mg/dL (ref 0.44–1.00)
Chloride: 107 mmol/L (ref 101–111)
Chloride: 104 mmol/L (ref 101–111)
Creatinine, Ser: 0.72 mg/dL (ref 0.44–1.00)
GLUCOSE: 177 mg/dL — AB (ref 65–99)
Glucose, Bld: 165 mg/dL — ABNORMAL HIGH (ref 65–99)
Potassium: 4.2 mmol/L (ref 3.5–5.1)
Potassium: 4.3 mmol/L (ref 3.5–5.1)
Sodium: 140 mmol/L (ref 135–145)
Sodium: 137 mmol/L (ref 135–145)
TOTAL PROTEIN: 4.7 g/dL — AB (ref 6.5–8.1)
Total Protein: 4.6 g/dL — ABNORMAL LOW (ref 6.5–8.1)

## 2017-12-06 LAB — BASIC METABOLIC PANEL
Anion gap: 4 — ABNORMAL LOW (ref 5–15)
Anion gap: 6 (ref 5–15)
BUN: 31 mg/dL — ABNORMAL HIGH (ref 6–20)
BUN: 30 mg/dL (ref 6–20)
CHLORIDE: 106 mmol/L (ref 101–111)
CO2: 24 mmol/L (ref 22–32)
CO2: 24 mmol/L (ref 22–32)
CREATININE: 0.88 mg/dL (ref 0.44–1.00)
CREATININE: 0.8 mg/dL (ref 0.44–1.00)
Calcium: 7.8 mg/dL — ABNORMAL LOW (ref 8.9–10.3)
Calcium: 7.9 mg/dL (ref 8.9–10.3)
Chloride: 106 mmol/L (ref 101–111)
GFR calc Af Amer: >60 mL/min (ref >60)
GFR calc Af Amer: >60 mL/min (ref >60)
GFR calc non Af Amer: >60 mL/min (ref >60)
GFR calc non Af Amer: >60 mL/min (ref >60)
GLUCOSE: 693 mg/dL — AB (ref 65–99)
Glucose, Bld: 607 mg/dL (ref 65–99)
Potassium: 6.8 mmol/L (ref 3.5–5.1)
Potassium: 6.6 mmol/L (ref 3.5–5.1)
SODIUM: 136 mmol/L (ref 135–145)
Sodium: 134 mmol/L — ABNORMAL LOW (ref 135–145)

## 2017-12-06 LAB — GLUCOSE, CAPILLARY
Glucose-Capillary: 153 mg/dL — ABNORMAL HIGH (ref 65–99)
Glucose-Capillary: 188 mg/dL — ABNORMAL HIGH (ref 65–99)
Glucose-Capillary: 147 mg/dL — ABNORMAL HIGH (ref 65–99)
Glucose-Capillary: 166 mg/dL — ABNORMAL HIGH (ref 65–99)

## 2017-12-06 LAB — CBC
HEMATOCRIT: 24 % — AB (ref 36.0–46.0)
Hemoglobin: 7.5 g/dL — ABNORMAL LOW (ref 12.0–15.0)
MCH: 30.1 pg (ref 26.0–34.0)
MCHC: 31.3 g/dL (ref 30.0–36.0)
MCV: 96.4 fL (ref 78.0–100.0)
Platelets: 340 10*3/uL (ref 150–400)
RBC: 2.49 MIL/uL — ABNORMAL LOW (ref 3.87–5.11)
RDW: 16.5 % — AB (ref 11.5–15.5)
WBC: 10.2 10*3/uL (ref 4.0–10.5)

## 2017-12-06 LAB — GLUCOSE, RANDOM
GLUCOSE: 147 mg/dL — AB (ref 65–99)
Glucose, Bld: 156 mg/dL — ABNORMAL HIGH (ref 65–99)

## 2017-12-06 MED ORDER — ZINC TRACE METAL 1 MG/ML IV SOLN
INTRAVENOUS | Status: AC
  Administered 2017-12-06: 17:00:00 via INTRAVENOUS
  Filled 2017-12-06: qty 924

## 2017-12-06 MED ORDER — MEPERIDINE HCL 50 MG PO TABS
100.0000 mg | ORAL_TABLET | Freq: Four times a day (QID) | ORAL | Status: DC | PRN
  Administered 2017-12-07 – 2017-12-10 (×7): 100 mg via ORAL
  Filled 2017-12-06 (×7): qty 2

## 2017-12-06 MED ORDER — SENNA 8.6 MG PO TABS: 1.0000 | ORAL_TABLET | Freq: Two times a day (BID) | ORAL | Status: DC | PRN

## 2017-12-06 MED ORDER — ENSURE ENLIVE PO LIQD: 237.0000 mL | Freq: Two times a day (BID) | ORAL | Status: DC

## 2017-12-06 MED ORDER — MEPERIDINE HCL 25 MG/ML IJ SOLN: 12.5000 mg | INTRAMUSCULAR | Status: DC | PRN

## 2017-12-06 MED ORDER — DOCUSATE SODIUM 100 MG PO CAPS
100.0000 mg | ORAL_CAPSULE | Freq: Two times a day (BID) | ORAL | Status: DC
  Administered 2017-12-06 – 2017-12-10 (×9): 100 mg via ORAL
  Filled 2017-12-06 (×9): qty 1

## 2017-12-06 NOTE — Progress Notes (Signed)
Changed patient's right groin dressing. Dressing was saturated. Patient's pubis area, hard and bruised. Right side continues to be firm as well. Will continue to monitor

## 2017-12-06 NOTE — Progress Notes (Signed)
Morphine PCA - wasted 21cc in sink witnessed by Royetta Crochet.  Viviana Simpler, RN

## 2017-12-06 NOTE — Progress Notes (Signed)
SLP Cancellation Note  Patient Details Name: Saryia Gens MRN: [id number] DOB: 1946/03/03   Cancelled treatment:       Reason Eval/Treat Not Completed: Patient unavailable - with PT 1100.     Blenda Mounts Laurice 12/06/2017, 4:47 PM

## 2017-12-06 NOTE — Progress Notes (Signed)
    Subjective  -   Ate 1/2 of a blueberry muffin yesterday No nausea. Needs to have a bowel movement   Physical Exam:  Right groin unchanged Bilateral upper extremities and left foot unchanged with stable chronic ischemic changes  Assessment/Plan:    Nutrition:  Severe malnutrition, Currently getting TPN via PICC.  If she is able to remain without nausea and can have a bowel movement, I will consider placing a NG tube for tube feed, possibly tomorrow or Friday.  This should help with placement.  Marinol started yesterday to help with appetite  Pain:  Morphine PCA will be discontinued as it causes her to be drowsy.  SHe will start with PO Demerol with IV as needed.  Continue with meticulous care of right groin.  SHe is at risk for future complications.  Colace and senna added to assist with bowel function.  She does not want Mirilax.      [name] 12/06/2017 1:55 PM --  Vitals:   12/06/17 1224 12/06/17 1225  BP: (!) 53/20 (!) 85/47  Pulse:    Resp: 20 18  Temp: (!) 97.5 F (36.4 C)   SpO2:      Intake/Output Summary (Last 24 hours) at 12/06/2017 1355 Last data filed at 12/06/2017 1002 Gross per 24 hour  Intake 901.17 ml  Output 1925 ml  Net -1023.83 ml     Laboratory CBC    Component Value Date/Time   WBC 10.2 12/06/2017 0422   HGB 7.5 (L) 12/06/2017 0422   HCT 24.0 (L) 12/06/2017 0422   HCT 27.2 (L) 12/14/2015 0445   PLT 340 12/06/2017 0422    BMET    Component Value Date/Time   NA 140 12/06/2017 0653   K 4.2 12/06/2017 0653   CL 107 12/06/2017 0653   CO2 27 12/06/2017 0653   GLUCOSE 165 (H) 12/06/2017 0653   BUN 33 (H) 12/06/2017 0653   CREATININE 0.76 12/06/2017 0653   CALCIUM 8.0 (L) 12/06/2017 0653   GFRNONAA >60 12/06/2017 0653   GFRAA >60 12/06/2017 0653    COAG Lab Results  Component Value Date   INR 1.60 (H) 12/11/2015   INR 1.07 08/19/2015   INR 1.01 06/05/2015   No results found for: PTT  Antibiotics Anti-infectives (From  admission, onward)   Start     Dose/Rate Route Frequency Ordered Stop   11/23/17 1000  sulfamethoxazole-trimethoprim (BACTRIM,SEPTRA) 400-80 MG per tablet 1 tablet  Status:  Discontinued     1 tablet Oral 2 times daily 11/23/17 0800 11/23/17 1110   11/23/17 0100  vancomycin (VANCOCIN) IVPB 1000 mg/200 mL premix     1,000 mg 200 mL/hr over 60 Minutes Intravenous Every 12 hours 11/07/2017 1447 11/23/17 1705       V. Charlena Cross, M.D. [facility] [place] Office: [phone] Pager:  [phone]

## 2017-12-06 NOTE — Consult Note (Signed)
WOC Nurse wound consult note Reason for Consult: Multiple sacral/buttock wounds  Wound type:MASD, friction, shear, pressure Pressure Injury POA: No Measurement: The patient's left buttock has a area covered with black eschar that measures approximately 8 cm x 6 cm.  For this area, I recommend applying betadine twice daily and allowing it to air dry and leave it open to air.  The eschar at the time of my assessment in 4E20 is dry, stable, and without drainage when palpated.  Applying betadine and allowing it to remain open to air should help maintain a stable eschar--the body's own "bandage".  Additionally, there are two linear "lines" above the eschar consistent with pressure from linen creases, that are maroon in color, therefore consistent with DTIs.  These areas do not need a topical treatment; monitoring for further deterioration of these areas is appropriate.  The gluteal cleft and coccyx area has highly saturated tissue that is brown/yellow slough and maroon in color, all of which is consistent with progression of previously noted DTI areas.  The slough, and peeling skin, extends to the right buttock just lateral to the gluteal cleft.  There area linear marks that are maroon in color below the main area on the right buttock, and these are consistent with DTI from linen creases.  Application of multiple [employer] hydrocolloid dressings will help protect the areas and start the autolytic debridement process.    The patient currently is on an air mattress and has an external urinary device (Purewick) for urinary incontinence.  Unfortunately, her disposable underpads where heavily saturated with urine.  Orders to use only Dermatherapy underpads will also be initiated. Monitor the wound area(s) for worsening of condition such as: Signs/symptoms of infection,  Increase in size,  Development of or worsening of odor, Development of pain, or increased pain at the affected locations.  Notify the medical  team if any of these develop.  Thank you for the consult.  Discussed plan of care with the patient and bedside nurse.  WOC nurse will not follow at this time.  Please re-consult the WOC team if needed.  Helmut Muster, RN, MSN, CWOCN, CNS-BC, pager [phone]

## 2017-12-06 NOTE — Progress Notes (Signed)
Received critical labs K+ 6.8 and serum glucose 693. Redrew BMP; awaiting results. Will continue to monitor

## 2017-12-06 NOTE — Progress Notes (Addendum)
Patient has tennis size wound, unstageable on right side of buttocks. Patient also has other breakdown areas on buttocks. Placed barrier cream and pink foam. Placed wound consult

## 2017-12-06 NOTE — Progress Notes (Signed)
PHARMACY - ADULT TOTAL PARENTERAL NUTRITION CONSULT NOTE    Pharmacy Consult for TPN Indication: Persistent ileus and inability to tolerate PO  Patient Measurements: Height: 5\' 4"  (162.6 cm) Weight: 243 lb (110.2 kg) IBW/kg (Calculated) : 54.7 TPN AdjBW (KG): 59.2 Body mass index is 41.71 kg/m.  Assessment:  [name] presents for vascular surgery on 5/21 for PVD. Was doing well postop and ready for discharge but then had pseudoaneurysm / hematoma in R groin and became diaphoretic and hypotensive. She was intubated and taken to OR emergently for evac and exploration of R groin. Has been NPO or on clear liquid for over a week and just now trying to advance diet however not tolerating well and abdominal Xray shows dilated small bowel loops indicating possible ileus. Vascular reluctant to trial TFs with a persistent ileus and risk of aspiration. Pharmacy consulted to manage TPN.   GI: Ileus resolving, +BM.  Prealbumin < 5. PPI IV, PRN Zofran, starting dronabinol. Endo: DM - CBGs elevated with increased TPN rate and PO intake Insulin requirements in the past 24 hours: 20 units SSI + 30 units in TPN Lytes: Mag (goal >/= 2 for ileus), CorCa 10.5 Renal: SCr stable, BUN 31- UOP 1.1 ml/kg/hr Pulm: 4L [place] >> RA Cards: tachy - Lasix 20mg  IV Q12H, Lopressor Hepatobil: LFT / tbili / TG WNL Neuro: Morphine PCA - pain score 0 ID: Afebrile, WBC 10.2 - not on abx TPN Access: CVC double lumen >> replace with PICC TPN start date: 12/01/17  Nutritional Goals (per RD recommendation on 5/31): 1800-2000 kCal, 100-115 gm protein and 1.8-2 L fluid per day  Current Nutrition:  Dysphagia 3 diet = increasing intake (25% meals yesterday) TPN  Plan:  Continue TPN at 70 mL/hr (goal rate 83 ml/hr).  Hold off on advancing to goal to allow for PO intake. This TPN provides 92g AA, 252g CHO, and 34g ILE which provides 1562 kCals per day, meeting 90% of minimal protein and 87% of kCal needs. Electrolytes in TPN: K  incr 6/3 (96 mEq/d), Cl:Ac 1:1 Add MVI and trace elements to TPN Add vitamin C + zinc for wound healing Continue moderate SSI Q4H + regular insulin 30 units in TPN F/U PO intake and TPN needs if inadequate  Standard TPN labs on Thurs  Daylene Posey, PharmD Pharmacy Resident [phone] 12/06/2017 10:27 AM

## 2017-12-06 NOTE — Progress Notes (Signed)
Clinical Social Worker following patient for support and discharge needs. Patient and husband are coming from [facility]. Plan for discharge was for patient to go into the SNF side of [facility]. CSW contacted admission coordinator and they stated they will be unable to take patient with TPN because they do not have the capacity to administer it. CSW relayed information to patient and spouse at bedside. CSW stated that if patient discharge with TPN CSW would have to fax her out to the surrounding SNF in [place]. Patients spouse [name] was not very happy with [facility] denying patient due to the TPN. CSW explained that once CSW receives other offers from surrounding SNF [name] can look at them prior to discharge. CSW to find SNF that are able to take TPN and follow up with family.  12:00pm:   CSW received a phone call from patient RN stating that patients husband would like to speak to CSW again. RN stated that per husband he had spoken to MD and they may look at other forms of feeding for patient. RN stated per husband he thinks patient will get a feeding tube through her noise. CSW stated if patient receives cortrak she will be unable to transition to any SNF level of care. CSW to continue to follow patient for support and discharge needs.   [name], MSW,  [employer] [phone]

## 2017-12-06 NOTE — Progress Notes (Signed)
Physical Therapy Treatment Patient Details Name: [name] MRN: [id number] DOB: 09-02-1945 Today's Date: 12/06/2017    History of Present Illness [name] is a 72 y.o. female today for follow-up of diffuse peripheral vascular occlusive disease dating back over 20 years.  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  She has rest pain in her left foot with no new open ulceration. Pt s/p R femoral graft repair followed by bleeding    PT Comments    Pt admitted with above diagnosis. Pt currently with functional limitations due to balance and endurance deficits.  Pt was able to sit EOB for 8 min with mod to max assist for balance.  Noted that pads were wet at end of session and changed all pads and noted purewick had not been suctioning.  Fixed the suction canister and the purewick was placed properly.  Pt did much better in session today.  Pt will benefit from skilled PT to increase their independence and safety with mobility to allow discharge to the venue listed below.     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 patient requires max A for repostioning in bed.  Transfers                    Ambulation/Gait             General Gait Details: pt  non-ambulatory   Stairs             Wheelchair Mobility    Modified Rankin (Stroke Patients Only)       Balance Overall balance assessment: Needs assistance Sitting-balance support: Feet supported;Bilateral upper extremity supported Sitting balance-Leahy Scale: Poor Sitting balance - Comments: pt tolerated sitting x 8 min but required mod to maxA to maintain balance/upright posture. Pt anxious and needed encouragement.  At times posteriorly leaning and needing cues to sit tall.  Also leans to left therefore leaned pt on right elbow to attempt to incr midline posture.   Postural control: Posterior lean                                  Cognition Arousal/Alertness: Awake/alert Behavior During Therapy: Flat affect Overall Cognitive Status: Impaired/Different from baseline                                        Exercises General Exercises - Lower Extremity Ankle Circles/Pumps: 10 reps;AROM;Left;Supine Long Arc Quad: AROM;5 reps;Seated;Left    General Comments        Pertinent Vitals/Pain Pain Assessment: Faces Faces Pain Scale: Hurts whole lot Pain Location: L LE, right groin Pain Descriptors /  Indicators: Grimacing Pain Intervention(s): Limited activity within patient's tolerance;Monitored during session;Premedicated before session;Repositioned;PCA encouraged    Home Living                      Prior Function            PT Goals (current goals can now be found in the care plan section) Acute Rehab PT Goals Patient Stated Goal: didn't state Progress towards PT goals: Progressing toward goals    Frequency    Min 2X/week      PT Plan Current plan remains appropriate    Co-evaluation              AM-PAC PT "6 Clicks" Daily Activity  Outcome Measure  Difficulty turning over in bed (including adjusting bedclothes, sheets and blankets)?: Unable Difficulty moving from lying on back to sitting on the side of  the bed? : A Lot Difficulty sitting down on and standing up from a chair with arms (e.g., wheelchair, bedside commode, etc,.)?: A Lot Help needed moving to and from a bed to chair (including a wheelchair)?: Total Help needed walking in hospital room?: Total Help needed climbing 3-5 steps with a railing? : Total 6 Click Score: 8    End of Session Equipment Utilized During Treatment: Oxygen;Gait belt Activity Tolerance: Patient limited by pain;Patient limited by fatigue Patient left: in bed;with call bell/phone within reach Nurse Communication: Mobility status;Need for lift equipment PT Visit Diagnosis: Unsteadiness on feet (R26.81);Muscle weakness (generalized) (M62.81);Difficulty in walking, not elsewhere classified (R26.2)     Time: 7262-0355 PT Time Calculation (min) (ACUTE ONLY): 26 min  Charges:  $Therapeutic Exercise: 8-22 mins $Therapeutic Activity: 8-22 mins                    G Codes:       Kendrick Remigio,PT Acute Rehabilitation [phone] (pager)    Berline Lopes 12/06/2017, 1:09 PM

## 2017-12-07 LAB — GLUCOSE, CAPILLARY
GLUCOSE-CAPILLARY: 104 mg/dL — AB (ref 65–99)
GLUCOSE-CAPILLARY: 124 mg/dL — AB (ref 65–99)
Glucose-Capillary: 121 mg/dL — ABNORMAL HIGH (ref 65–99)
Glucose-Capillary: 131 mg/dL — ABNORMAL HIGH (ref 65–99)
Glucose-Capillary: 133 mg/dL — ABNORMAL HIGH (ref 65–99)
Glucose-Capillary: 148 mg/dL — ABNORMAL HIGH (ref 65–99)
Glucose-Capillary: 52 mg/dL — ABNORMAL LOW (ref 65–99)

## 2017-12-07 LAB — COMPREHENSIVE METABOLIC PANEL WITH GFR
ALT: 24 U/L (ref 14–54)
AST: 28 U/L (ref 15–41)
Albumin: 1.1 g/dL — ABNORMAL LOW (ref 3.5–5.0)
Alkaline Phosphatase: 95 U/L (ref 38–126)
Anion gap: 4 — ABNORMAL LOW (ref 5–15)
BUN: 35 mg/dL — ABNORMAL HIGH (ref 6–20)
CO2: 27 mmol/L (ref 22–32)
Calcium: 8 mg/dL — ABNORMAL LOW (ref 8.9–10.3)
Chloride: 109 mmol/L (ref 101–111)
Creatinine, Ser: 0.75 mg/dL (ref 0.44–1.00)
GFR calc Af Amer: >60 mL/min
GFR calc non Af Amer: >60 mL/min
Glucose, Bld: 117 mg/dL — ABNORMAL HIGH (ref 65–99)
Potassium: 4.2 mmol/L (ref 3.5–5.1)
Sodium: 140 mmol/L (ref 135–145)
Total Bilirubin: 0.5 mg/dL (ref 0.3–1.2)
Total Protein: 4.5 g/dL — ABNORMAL LOW (ref 6.5–8.1)

## 2017-12-07 LAB — HEMOGLOBIN AND HEMATOCRIT, BLOOD
HEMATOCRIT: 22.4 % — AB (ref 36.0–46.0)
HEMOGLOBIN: 6.9 g/dL — AB (ref 12.0–15.0)

## 2017-12-07 LAB — PHOSPHORUS: Phosphorus: 4.4 mg/dL (ref 2.5–4.6)

## 2017-12-07 LAB — PREPARE RBC (CROSSMATCH)

## 2017-12-07 LAB — GLUCOSE, RANDOM: Glucose, Bld: 118 mg/dL — ABNORMAL HIGH (ref 65–99)

## 2017-12-07 LAB — MAGNESIUM: MAGNESIUM: 1.9 mg/dL (ref 1.7–2.4)

## 2017-12-07 MED ORDER — INSULIN ASPART 100 UNIT/ML ~~LOC~~ SOLN
0.0000 [IU] | Freq: Three times a day (TID) | SUBCUTANEOUS | Status: DC
  Administered 2017-12-07 – 2017-12-08 (×5): 2 [IU] via SUBCUTANEOUS
  Administered 2017-12-09: 3 [IU] via SUBCUTANEOUS
  Administered 2017-12-09: 2 [IU] via SUBCUTANEOUS
  Administered 2017-12-10: 8 [IU] via SUBCUTANEOUS
  Administered 2017-12-10: 11 [IU] via SUBCUTANEOUS
  Administered 2017-12-10: 3 [IU] via SUBCUTANEOUS
  Administered 2017-12-11: 5 [IU] via SUBCUTANEOUS
  Administered 2017-12-11: 8 [IU] via SUBCUTANEOUS
  Administered 2017-12-11: 5 [IU] via SUBCUTANEOUS

## 2017-12-07 MED ORDER — SODIUM CHLORIDE 0.9 % IV SOLN: Freq: Once | INTRAVENOUS | Status: DC

## 2017-12-07 MED ORDER — ZINC TRACE METAL 1 MG/ML IV SOLN
INTRAVENOUS | Status: AC
  Administered 2017-12-07: 17:00:00 via INTRAVENOUS
  Filled 2017-12-07: qty 924

## 2017-12-07 NOTE — Progress Notes (Signed)
    Subjective  -   Feels a little better today.  Ate another 1/2 muffin yesterday Had a BM yesterday     Assessment/Plan:    Acute blood loss anemia:  Hb down today.  WIll transfuse 2 units prbc today.  Guiac all stools  Will place feeding tube in am.    Continue TPN  Working on placement  [employer] 12/07/2017 11:48 AM --  Vitals:   12/07/17 0619 12/07/17 1132  BP: (!) 81/57 (!) 69/33  Pulse:  100  Resp: 15   Temp:  97.6 F (36.4 C)  SpO2:  92%    Intake/Output Summary (Last 24 hours) at 12/07/2017 1148 Last data filed at 12/07/2017 0842 Gross per 24 hour  Intake 1924 ml  Output 2600 ml  Net -676 ml     Laboratory CBC    Component Value Date/Time   WBC 10.2 12/06/2017 0422   HGB 6.9 (LL) 12/07/2017 0820   HCT 22.4 (L) 12/07/2017 0820   HCT 27.2 (L) 12/14/2015 0445   PLT 340 12/06/2017 0422    BMET    Component Value Date/Time   NA 140 12/07/2017 0500   K 4.2 12/07/2017 0500   CL 109 12/07/2017 0500   CO2 27 12/07/2017 0500   GLUCOSE 117 (H) 12/07/2017 0500   BUN 35 (H) 12/07/2017 0500   CREATININE 0.75 12/07/2017 0500   CALCIUM 8.0 (L) 12/07/2017 0500   GFRNONAA >60 12/07/2017 0500   GFRAA >60 12/07/2017 0500    COAG Lab Results  Component Value Date   INR 1.60 (H) 12/11/2015   INR 1.07 08/19/2015   INR 1.01 06/05/2015   No results found for: PTT  Antibiotics Anti-infectives (From admission, onward)   Start     Dose/Rate Route Frequency Ordered Stop   11/23/17 1000  sulfamethoxazole-trimethoprim (BACTRIM,SEPTRA) 400-80 MG per tablet 1 tablet  Status:  Discontinued     1 tablet Oral 2 times daily 11/23/17 0800 11/23/17 1110   11/23/17 0100  vancomycin (VANCOCIN) IVPB 1000 mg/200 mL premix     1,000 mg 200 mL/hr over 60 Minutes Intravenous Every 12 hours 11/25/2017 1447 11/23/17 1705       V. Charlena Cross, M.D. [facility] [place] Office: [phone] Pager:  [phone]

## 2017-12-07 NOTE — Progress Notes (Signed)
Clinical Social Worker following patient for support and discharge needs. Patient and spouse Hessie Diener) are both from [place] and are wanting to go back to facility. CSW spoke with Hessie Diener to go over discharge plan. Hessie Diener is aware that [facility] is unable to take patient to their SNF level with TPN. CSW followed up with Hessie Diener and stated that there are two facilities in the [place] area that will take patient with TPN.Those facilities are [facility] and [facility]. Hessie Diener was not receptive to the idea of sending patient to any other facility aside from [facility]. Hessie Diener stated "I pay 7 thousand dollar a month and rehab is already paid for so I want to use it". Hessie Diener asked if patient stops using TPN and receives an NG Tube instead can patient discharge back to [facility]. CSW explained to patient that no facility in the state can take a patient with an NG Tube. Hessie Diener asked what patient's barriers are for her to return back to [facility]. CSW explained that due to the TPN [facility] does not have the capacity to take care of patient. CSW contacted patients MD (Dr.Brabham) to make him aware of conversation CSW had with family.   CSW and RNCM spoke with MD via phone. MD requested that CSW follow up with [facility] to verify that they will be unable to take an NG Tube. MD also requested CSW asked if facility is unable to take NG Tube can they take patient with a G Tube and if not that can patient return back to her independent living facility with support from a private RN. CSW contacted facility and spoke with Oley Balm stated she will have to follow up with her higher up's and nursing staff to see if that could be accommodated. CSW awaiting phone call back from [facility], MSW,  [place] [phone]

## 2017-12-07 NOTE — Progress Notes (Signed)
[facility] [place]  Subjective  - Doing well this am.  She had a BM last night and is not having too much pain.   Objective (!) 81/57 94 97.8 F (36.6 C) (Axillary) 15 100%  Intake/Output Summary (Last 24 hours) at 12/07/2017 0735 Last data filed at 12/07/2017 0300 Gross per 24 hour  Intake 2054 ml  Output 1900 ml  Net 154 ml    Left foot warm to touch toe tips have not changes Hands and fingers stable cool touch with active range of motion intact Abdomin soft, NTTP, right LQ groin wound superficial skin tear.  Dry protective dressing applied. Gen NAD  Assessment/Planning: 11/26/2017 Evacuation and exploration of right groin with closure of puncture of right femoral graft. Balloon angioplasty, left peroneal and tibioperoneal trunk Stent, left superficial femoral artery  Positive BM last night PCA d/c yesterday.  Minimal PO demerol used yesterday without complaint of increase pain.  Nurses state the patient was more alert now that she was off constant pain medication. TPN for nutritional support PICC line in place SSI changed to AC/HS Speech eval canceled yesterday will try again today  [facility] denying patient due to the TPN.  CSW explained that once CSW receives other offers from surrounding SNF Hessie Diener can look at them prior to discharge. CSW to find SNF that are able to take TPN and follow up with family. Pending SNF offers. HGB 7.5 yesterday will redraw H/H to recheck. Cr stable, WBC 10.2 12/06/2017     [name] 12/07/2017 7:35 AM --  Laboratory Lab Results: Recent Labs    12/06/17 0422  WBC 10.2  HGB 7.5*  HCT 24.0*  PLT 340   BMET Recent Labs    12/06/17 1425  12/07/17 0100 12/07/17 0500  NA 137  --   --  140  K 4.3  --   --  4.2  CL 104  --   --  109  CO2 25  --   --  27  GLUCOSE 177*   < > 118* 117*  BUN 33*  --   --  35*  CREATININE 0.72  --   --  0.75  CALCIUM 8.0*  --   --  8.0*   < > = values in this  interval not displayed.    COAG Lab Results  Component Value Date   INR 1.60 (H) 12/11/2015   INR 1.07 08/19/2015   INR 1.01 06/05/2015   No results found for: PTT

## 2017-12-07 NOTE — Progress Notes (Signed)
PHARMACY - ADULT TOTAL PARENTERAL NUTRITION CONSULT NOTE    Pharmacy Consult for TPN Indication: Persistent ileus and inability to tolerate PO  Patient Measurements: Height: 5\' 4"  (162.6 cm) Weight: 228 lb (103.4 kg) IBW/kg (Calculated) : 54.7 TPN AdjBW (KG): 66.9 Body mass index is 39.14 kg/m.  Assessment:  [name] presents for vascular surgery on 5/21 for PVD. Was doing well postop and ready for discharge but then had pseudoaneurysm / hematoma in R groin and became diaphoretic and hypotensive. She was intubated and taken to OR emergently for evac and exploration of R groin. Has been NPO or on clear liquid for over a week and just now trying to advance diet however not tolerating well and abdominal Xray shows dilated small bowel loops indicating possible ileus. Vascular reluctant to trial TFs with a persistent ileus and risk of aspiration. Pharmacy consulted to manage TPN.   GI: Ileus resolving, +BM.  Prealbumin < 5. PPI IV, PRN Zofran, dronabinol.  NG tube may be placed 6/7. Endo: DM - CBGs controlled with 30 units added to TPN and SSI (changed to AC/HS) Insulin requirements in the past 24 hours: 8 units SSI + 30 units in TPN Lytes: Mag 1.9 (goal >/= 2 for ileus), CorCa 10.4 Renal: SCr stable, BUN 35- UOP 0.8 ml/kg/hr Pulm: 3L [place] Cards: tachy - Lasix 20mg  IV Q12H, Lopressor Hepatobil: LFT / tbili / TG WNL Neuro: Morphine PCA>demerol - pain score 0-6 ID: Afebrile, WBC 10.2 - not on abx TPN Access: CVC double lumen >> replaced with PICC TPN start date: 12/01/17  Nutritional Goals (per RD recommendation on 5/31): 1800-2000 kCal, 100-115 gm protein and 1.8-2 L fluid per day  Current Nutrition:  Dysphagia 3 diet = Fluctuating PO intake, down to 10% yesterday, alert and hungry today now off PCA Ensure Enlive (refusing) TPN  Plan:  Continue TPN at 70 mL/hr (goal rate 83 ml/hr).  Not advancing d/t increased alertness and appetite and tube feed plans. This TPN provides 92g AA, 252g  CHO, and 34g ILE which provides 1562 kCals per day, meeting 90% of minimal protein and 87% of kCal needs. Electrolytes in TPN: K incr 6/3 (96 mEq/d), Mg increase, phos dec, Ca dec,  Cl:Ac 1:1 Add MVI and trace elements to TPN Add vitamin C + zinc for wound healing Moderate SSI AC/HS + regular insulin decrease to 25 units in TPN F/U PO intake and TPN needs if inadequate  Standard TPN labs Mon/Thurs  Daylene Posey, PharmD Pharmacy Resident [phone] 12/07/2017 7:18 AM

## 2017-12-07 NOTE — Care Management Important Message (Signed)
Important Message  Patient Details  Name: [name] MRN: [id number] Date of Birth: October 04, 1945   Medicare Important Message Given:  Yes    Oralia Rud Lillie Portner 12/07/2017, 3:15 PM

## 2017-12-07 NOTE — Progress Notes (Addendum)
Hemoglobin 6.9 per Marchelle Folks from lab. MD notified. Per Dr. Myra Gianotti, give two units PRBC's.

## 2017-12-08 ENCOUNTER — Inpatient Hospital Stay (HOSPITAL_COMMUNITY): Payer: Medicare Other

## 2017-12-08 ENCOUNTER — Encounter (HOSPITAL_BASED_OUTPATIENT_CLINIC_OR_DEPARTMENT_OTHER): Payer: Medicare Other

## 2017-12-08 LAB — CBC WITH DIFFERENTIAL/PLATELET
BASOS ABS: 0.1 10*3/uL (ref 0.0–0.1)
BASOS PCT: 1 %
EOS ABS: 0.1 10*3/uL (ref 0.0–0.7)
Eosinophils Relative: 1 %
HCT: 33.3 % — ABNORMAL LOW (ref 36.0–46.0)
Hemoglobin: 10.6 g/dL — ABNORMAL LOW (ref 12.0–15.0)
LYMPHS PCT: 8 %
Lymphs Abs: 0.7 10*3/uL (ref 0.7–4.0)
MCH: 27.7 pg (ref 26.0–34.0)
MCHC: 31.8 g/dL (ref 30.0–36.0)
MCV: 86.9 fL (ref 78.0–100.0)
MONO ABS: 0.9 10*3/uL (ref 0.1–1.0)
Monocytes Relative: 10 %
NEUTROS PCT: 80 %
Neutro Abs: 6.7 10*3/uL (ref 1.7–7.7)
Platelets: 398 10*3/uL (ref 150–400)
RBC: 3.83 MIL/uL — ABNORMAL LOW (ref 3.87–5.11)
RDW: 18.8 % — AB (ref 11.5–15.5)
WBC Morphology: INCREASED
WBC: 8.5 10*3/uL (ref 4.0–10.5)

## 2017-12-08 LAB — GLUCOSE, CAPILLARY
GLUCOSE-CAPILLARY: 143 mg/dL — AB (ref 65–99)
GLUCOSE-CAPILLARY: 155 mg/dL — AB (ref 65–99)
GLUCOSE-CAPILLARY: 143 mg/dL — AB (ref 65–99)
GLUCOSE-CAPILLARY: 153 mg/dL — AB (ref 65–99)
Glucose-Capillary: 136 mg/dL — ABNORMAL HIGH (ref 65–99)
Glucose-Capillary: 151 mg/dL — ABNORMAL HIGH (ref 65–99)

## 2017-12-08 MED ORDER — ENSURE ENLIVE PO LIQD
237.0000 mL | Freq: Three times a day (TID) | ORAL | Status: DC
  Administered 2017-12-08: 237 mL via ORAL

## 2017-12-08 MED ORDER — JEVITY 1.2 CAL PO LIQD: 1000.0000 mL | ORAL | Status: DC

## 2017-12-08 MED ORDER — GLUCERNA 1.2 CAL PO LIQD
1000.0000 mL | ORAL | Status: DC
  Administered 2017-12-08: 1000 mL
  Filled 2017-12-08 (×5): qty 1000

## 2017-12-08 MED ORDER — GLUCERNA 1.2 CAL PO LIQD
1000.0000 mL | ORAL | Status: DC
  Filled 2017-12-08 (×2): qty 1000

## 2017-12-08 MED ORDER — ENSURE ENLIVE PO LIQD
237.0000 mL | Freq: Three times a day (TID) | ORAL | Status: DC
  Filled 2017-12-08 (×4): qty 237

## 2017-12-08 NOTE — Progress Notes (Addendum)
Nutrition Follow Up  DOCUMENTATION CODES:   Obesity unspecified  INTERVENTION:    Initiate Glucerna 1.2 at 25 ml/hr and increase by 10 ml every 6 hours to goal rate of 65 ml/hr  Provides 1872 kcals, 93 gm protein, 1256 ml of free water daily    Ensure Enlive po TID, each supplement provides 350 kcal and 20 grams of protein  NUTRITION DIAGNOSIS:   Increased nutrient needs related to chronic illness, wound healing as evidenced by estimated needs, ongoing  GOAL:   Patient will meet greater than or equal to 90% of their needs, progressing  MONITOR:   PO intake, Supplement acceptance, Labs, Weight trends, Skin, I & O's  ASSESSMENT:   72 yo Female who underwent left superficial femoral and profundofemoral arterial angiography, angioplasty and then stent placeme.nt 5/21.  Course complicated by pseudoaneurysm, acute hemorrhage and associated hemorrhagic shock.  Pt continues on PO diet, however, eating very little. Cortrak 10 F feeding tube placed today, tip is in mid-stomach. Plan is to start 24 hr continuous TF to establish tolerance.   Hopefully pt's PO intake will improve and we can transition to nocturnal TF. Labs and medications reviewed. Megace started 6/4. CBG's [id number].  RD spoke with: Dr. Myra Gianotti to confirm nutrition care plan. Christiane Ha, PharmD. Plan to taper TPN. Hessie Diener, husband.  Diet Order:   Diet Order           Diet Carb Modified Fluid consistency: Thin; Room service appropriate? Yes  Diet effective now         EDUCATION NEEDS:   No education needs have been identified at this time  Skin:  Skin Assessment: Skin Integrity Issues: Skin Integrity Issues:: DTI DTI: sacrum  Last BM:  6/5   Intake/Output Summary (Last 24 hours) at 12/08/2017 1325 Last data filed at 12/08/2017 0900 Gross per 24 hour  Intake 2483.33 ml  Output 1900 ml  Net 583.33 ml   Height:   Ht Readings from Last 1 Encounters:  12/06/17 5\' 4"  (1.626 m)   Weight:   Wt Readings  from Last 1 Encounters:  12/06/17 228 lb (103.4 kg)   Ideal Body Weight:  54.5 kg  BMI:  Body mass index is 39.14 kg/m.  Estimated Nutritional Needs:   Kcal:  1800-2000  Protein:  90-105 gm  Fluid:  1.8-2.0 L  Maureen Chatters, RD, LDN Pager #: [phone] After-Hours Pager #: [phone]

## 2017-12-08 NOTE — Progress Notes (Signed)
  Speech Language Pathology Treatment: Dysphagia  Patient Details Name: Nekeia Sheler MRN: [id number] DOB: Oct 01, 1945 Today's Date: 12/08/2017 Time: 2694-8546 SLP Time Calculation (min) (ACUTE ONLY): 32 min  Assessment / Plan / Recommendation Clinical Impression  Husband presents and reports that she is "eating zero, may eat a few bites here and there but little to none." Currently on TPN, husband orders applesauce and Ensure for her. Agree with regular diet order which will allow food/texture of pt's choice. Plan is for Cortrak (RD in room to place now) and pt promised MD she would drink 3 Ensures a day. SLP facilitated oral care to remove debris adhered to dentition. Pt initiated tooth brushing and SLP assisted but unable to completely remove; oral cavity frequently dry. Explained relationship between oral cavity and pulmonary stability and advised her/spouse to initiate brushing teeth minimum of 2 times a day in hospital. Pt declined solids and took one sip Ensure without difficulty, would not take more at present and stated she "would work on it." Initially pt expressed difficulty masticating and did not want to move up to regular texture. ST will continue to briefly follow for efficiency with diet texture/oral manipulation. Unable to facilitate her decreased intake and failure to thrive beyond what interventions and education has been done.    HPI HPI: 72 year old woman who underwent left superficial femoral and profundofemoral arterial angiography, angioplasty and then stent placement 5/21.  Course complicated by pseudoaneurysm, acute hemorrhage and associated hemorrhagic shock.  She developed PEA and required brief resuscitation.  She required volume resuscitation, pressors.  She went back to the operating room for exploration and a small defect and her graft was repaired.  She was able to be extubated 5/22. Low-dose morphine PCA for pain. Swallow evaluation ordered due to RN concerns for safety  with POs due to decreased alertness.      SLP Plan  Continue with current plan of care       Recommendations  Diet recommendations: Regular;Thin liquid Liquids provided via: Cup;Straw Medication Administration: Whole meds with liquid Supervision: Patient able to self feed;Intermittent supervision to cue for compensatory strategies Compensations: Slow rate;Small sips/bites;Minimize environmental distractions Postural Changes and/or Swallow Maneuvers: Seated upright 90 degrees;Upright 30-60 min after meal                Oral Care Recommendations: Oral care BID Follow up Recommendations: Skilled Nursing facility SLP Visit Diagnosis: Dysphagia, unspecified (R13.10) Plan: Continue with current plan of care                      [name] 12/08/2017, 11:45 AM   [name].Ed [employer] [phone]

## 2017-12-08 NOTE — Progress Notes (Addendum)
[facility] [place]  Subjective  - Doing ok today no new complaints.   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    Firmness to palpation right LQ s/p hematoma.  Assessment/Planning:  5/22/2019Evacuation and exploration of right groin with closure of puncture of right femoral graft. Balloon angioplasty, left peroneal and tibioperoneal trunk Stent, left superficial femoral artery   Pending discussions NG tube is not accepted for any facility in [place], [facility] and [place] accept will accept TPN and her husband will not accept theses facilities.   S/P transfusion 2 units 12/07/2017 will obtain HGB 10.6 this am.      [name] 12/08/2017 7:13 AM --  Laboratory Lab Results: Recent Labs    12/06/17 0422 12/07/17 0820  WBC 10.2  --   HGB 7.5* 6.9*  HCT 24.0* 22.4*  PLT 340  --    BMET Recent Labs    12/06/17 1425  12/07/17 0100 12/07/17 0500  NA 137  --   --  140  K 4.3  --   --  4.2  CL 104  --   --  109  CO2 25  --   --  27  GLUCOSE 177*   < > 118* 117*  BUN 33*  --   --  35*  CREATININE 0.72  --   --  0.75  CALCIUM 8.0*  --   --  8.0*   < > = values in this interval not displayed.    COAG Lab Results  Component Value Date   INR 1.60 (H) 12/11/2015   INR 1.07 08/19/2015   INR 1.01 06/05/2015   No results found for: PTT

## 2017-12-08 NOTE — Plan of Care (Signed)
  Problem: Education: Goal: Knowledge of General Education information will improve Outcome: Progressing   Problem: Clinical Measurements: Goal: Will remain free from infection Outcome: Progressing Goal: Cardiovascular complication will be avoided Outcome: Progressing   Problem: Pain Managment: Goal: General experience of comfort will improve Outcome: Progressing   Problem: Safety: Goal: Ability to remain free from injury will improve Outcome: Progressing   Problem: Skin Integrity: Goal: Risk for impaired skin integrity will decrease Outcome: Progressing   

## 2017-12-08 NOTE — Progress Notes (Signed)
    Subjective    Black stool this am 2 units pRBC yestarday   Physical Exam:  Doppler peroneal, left Drainage from right groin, no skin openings   Assessment/Plan:   Nutrition:  For NG today and initiation of enteral feeds.  If she tolerates this, I will stop TPN tomorrow.  Leave PICC in for now.  Added Ensure shakes, which she will try.  Continue marinol. GI bleed:  2 units yesterday, Hb pending today.  May need repeat GI eval. Wound care for sacral decub Keep right groin dry with prn dressing changes.  At high risk for complications given nutritional status.   Wells Brabham 12/08/2017 1:34 PM --  Vitals:   12/08/17 0635 12/08/17 0821  BP: (!) 77/58 101/77  Pulse:  (!) 103  Resp: 20 (!) 22  Temp: 98.8 F (37.1 C) 98.4 F (36.9 C)  SpO2: 100% 100%    Intake/Output Summary (Last 24 hours) at 12/08/2017 1334 Last data filed at 12/08/2017 0900 Gross per 24 hour  Intake 2483.33 ml  Output 1900 ml  Net 583.33 ml     Laboratory CBC    Component Value Date/Time   WBC 8.5 12/08/2017 0747   HGB 10.6 (L) 12/08/2017 0747   HCT 33.3 (L) 12/08/2017 0747   HCT 27.2 (L) 12/14/2015 0445   PLT 398 12/08/2017 0747    BMET    Component Value Date/Time   NA 140 12/07/2017 0500   K 4.2 12/07/2017 0500   CL 109 12/07/2017 0500   CO2 27 12/07/2017 0500   GLUCOSE 117 (H) 12/07/2017 0500   BUN 35 (H) 12/07/2017 0500   CREATININE 0.75 12/07/2017 0500   CALCIUM 8.0 (L) 12/07/2017 0500   GFRNONAA >60 12/07/2017 0500   GFRAA >60 12/07/2017 0500    COAG Lab Results  Component Value Date   INR 1.60 (H) 12/11/2015   INR 1.07 08/19/2015   INR 1.01 06/05/2015   No results found for: PTT  Antibiotics Anti-infectives (From admission, onward)   Start     Dose/Rate Route Frequency Ordered Stop   11/23/17 1000  sulfamethoxazole-trimethoprim (BACTRIM,SEPTRA) 400-80 MG per tablet 1 tablet  Status:  Discontinued     1 tablet Oral 2 times daily 11/23/17 0800 11/23/17 1110   11/23/17 0100  vancomycin (VANCOCIN) IVPB 1000 mg/200 mL premix     1,000 mg 200 mL/hr over 60 Minutes Intravenous Every 12 hours 11/18/2017 1447 11/23/17 1705       V. Charlena Cross, M.D. [facility] [place] Office: [phone] Pager:  [phone]

## 2017-12-08 NOTE — Progress Notes (Addendum)
Physical Therapy Treatment Patient Details Name: [name] MRN: [id number] DOB: 1945-08-08 Today's Date: 12/08/2017    History of Present Illness [name] is a 72 y.o. female today for follow-up of diffuse peripheral vascular occlusive disease dating back over 20 years.  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  She has rest pain in her left foot with no new open ulceration. Pt s/p R femoral graft repair followed by bleeding    PT Comments    Pt admitted with above diagnosis. Pt currently with functional limitations due to balance and endurance deficits. Pt was able to sit EOB again with slightly less physical assist than last treatment.  Pt c/o incr pain today and was more difficult to distract today.  Progressing slowly.   Pt will benefit from skilled PT to increase their independence and safety with mobility to allow discharge to the venue listed below.     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 patient requires max A for repostioning in bed.  Transfers                    Ambulation/Gait             General Gait Details: pt non-ambulatory   Stairs             Wheelchair Mobility    Modified Rankin (Stroke Patients Only)       Balance Overall balance assessment: Needs assistance Sitting-balance support: Feet supported;Bilateral upper extremity supported Sitting balance-Leahy Scale: Poor Sitting balance - Comments: pt tolerated sitting x 8 min but required min to maxA to maintain balance/upright posture. Pt anxious and needed encouragement.  At times posteriorly leaning and needing cues to sit tall.  Also leans to left therefore leaned pt on right elbow to attempt to incr midline posture.   Postural control: Posterior lean                                  Cognition Arousal/Alertness: Awake/alert Behavior During Therapy: Flat affect Overall Cognitive Status: Impaired/Different from baseline                                        Exercises General Exercises - Upper Extremity Shoulder Flexion: AROM;Both;10 reps;Supine Shoulder ABduction: AROM;Both;10 reps;Supine Elbow Flexion: AROM;Both;10 reps;Supine General Exercises - Lower Extremity Ankle Circles/Pumps: 10 reps;AROM;Left;Supine Quad Sets: AROM;Both;10 reps;Supine Long Arc Quad: AROM;5 reps;Seated;Left    General Comments General comments (skin integrity, edema, etc.): On arrival, noted pt lying in BM and purewick leaking a little.  rolled and cleaned pt and changed pads.  Nursing came in at end  of treatment to replace new purewick.  Also nurse aware of large black sacral wound and was going to follow up with WOC.       Pertinent Vitals/Pain Pain Assessment: Faces Faces Pain Scale: Hurts little more Pain Location: L LE, right groin Pain Descriptors / Indicators: Grimacing Pain Intervention(s): Limited activity within patient's tolerance;Monitored during session;Premedicated before session;Repositioned  VSS  Home Living                      Prior Function            PT Goals (current goals can now be found in the care plan section) Acute Rehab PT Goals Patient Stated Goal: didn't state Progress towards PT  goals: Progressing toward goals    Frequency    Min 2X/week      PT Plan Current plan remains appropriate    Co-evaluation              AM-PAC PT "6 Clicks" Daily Activity  Outcome Measure  Difficulty turning over in bed (including adjusting bedclothes, sheets and blankets)?: Unable Difficulty moving from lying on back to sitting on the side of the bed? : A Lot Difficulty sitting down on and standing up from a chair with arms (e.g., wheelchair, bedside commode, etc,.)?: A Lot Help needed moving to and from a bed to chair (including a wheelchair)?: Total Help needed walking in hospital room?: Total Help needed climbing 3-5 steps with a railing? : Total 6 Click Score: 8    End of Session   Activity Tolerance: Patient limited by pain;Patient limited by fatigue Patient left: in bed;with call bell/phone within reach;with family/visitor present(husband arrived as PT finishing treatment) Nurse Communication: Mobility status;Need for lift equipment PT Visit Diagnosis: Unsteadiness on feet (R26.81);Muscle weakness (generalized) (M62.81);Difficulty in walking, not elsewhere classified (R26.2)     Time: 1610-9604 PT Time Calculation (min) (ACUTE ONLY): 21 min  Charges:  $Therapeutic Activity: 8-22 mins                    G Codes:       Bodie Abernethy,PT Acute Rehabilitation [phone] (pager)    Berline Lopes 12/08/2017, 1:00 PM

## 2017-12-08 NOTE — Progress Notes (Signed)
Cortrak Tube Team Note:  Consult received to place a Cortrak feeding tube.   A 10 F Cortrak tube was placed in the LEFT nare and secured with a nasal bridle at 59 cm. Per the Cortrak monitor reading the tube tip is stomach.   X-ray obtained due to difficult placement, abdominal x-ray has been ordered by the Cortrak team. Please confirm tube placement before using the Cortrak tube.   If the tube becomes dislodged please keep the tube and contact the Cortrak team at [email or web address] (password [email or web address]) for replacement.  If after hours and replacement cannot be delayed, place a NG tube and confirm placement with an abdominal x-ray.    Romelle Starcher MS, RD, LDN, CNSC [phone] Pager  [phone] Weekend/On-Call Pager

## 2017-12-08 NOTE — Plan of Care (Signed)
  Problem: Clinical Measurements: Goal: Will remain free from infection Outcome: Progressing   Problem: Pain Managment: Goal: General experience of comfort will improve Outcome: Progressing   Problem: Safety: Goal: Ability to remain free from injury will improve Outcome: Progressing   Problem: Skin Integrity: Goal: Risk for impaired skin integrity will decrease Outcome: Progressing   

## 2017-12-09 LAB — BPAM RBC
Blood Product Expiration Date: 201907052359
Blood Product Expiration Date: 201907052359
ISSUE DATE / TIME: 201906061415
ISSUE DATE / TIME: 201906070307
Unit Type and Rh: 5100
Unit Type and Rh: 5100

## 2017-12-09 LAB — TYPE AND SCREEN
ABO/RH(D): O POS
ANTIBODY SCREEN: NEGATIVE
UNIT DIVISION: 0
UNIT DIVISION: 0

## 2017-12-09 LAB — COMPREHENSIVE METABOLIC PANEL
ALBUMIN: 1.3 g/dL — AB (ref 3.5–5.0)
ALT: 33 U/L (ref 14–54)
ANION GAP: 9 (ref 5–15)
AST: 39 U/L (ref 15–41)
Alkaline Phosphatase: 155 U/L — ABNORMAL HIGH (ref 38–126)
BUN: 31 mg/dL — AB (ref 6–20)
CHLORIDE: 104 mmol/L (ref 101–111)
CO2: 26 mmol/L (ref 22–32)
Calcium: 8 mg/dL — ABNORMAL LOW (ref 8.9–10.3)
Creatinine, Ser: 0.84 mg/dL (ref 0.44–1.00)
GFR calc Af Amer: >60 mL/min (ref >60)
Glucose, Bld: 213 mg/dL — ABNORMAL HIGH (ref 65–99)
POTASSIUM: 3.8 mmol/L (ref 3.5–5.1)
Sodium: 139 mmol/L (ref 135–145)
TOTAL PROTEIN: 5.1 g/dL — AB (ref 6.5–8.1)
Total Bilirubin: 0.8 mg/dL (ref 0.3–1.2)

## 2017-12-09 LAB — GLUCOSE, CAPILLARY
GLUCOSE-CAPILLARY: 146 mg/dL — AB (ref 65–99)
GLUCOSE-CAPILLARY: 177 mg/dL — AB (ref 65–99)
GLUCOSE-CAPILLARY: 183 mg/dL — AB (ref 65–99)
GLUCOSE-CAPILLARY: 196 mg/dL — AB (ref 65–99)
GLUCOSE-CAPILLARY: 205 mg/dL — AB (ref 65–99)
Glucose-Capillary: 200 mg/dL — ABNORMAL HIGH (ref 65–99)

## 2017-12-09 LAB — CBC
HEMATOCRIT: 30.5 % — AB (ref 36.0–46.0)
Hemoglobin: 9.7 g/dL — ABNORMAL LOW (ref 12.0–15.0)
MCH: 27.6 pg (ref 26.0–34.0)
MCHC: 31.8 g/dL (ref 30.0–36.0)
MCV: 86.6 fL (ref 78.0–100.0)
PLATELETS: 362 10*3/uL (ref 150–400)
RBC: 3.52 MIL/uL — ABNORMAL LOW (ref 3.87–5.11)
RDW: 18.9 % — AB (ref 11.5–15.5)
WBC: 7.9 10*3/uL (ref 4.0–10.5)

## 2017-12-09 LAB — OCCULT BLOOD X 1 CARD TO LAB, STOOL: FECAL OCCULT BLD: POSITIVE — AB

## 2017-12-09 NOTE — Progress Notes (Signed)
[facility] [place]  Subjective  - feels ok tolerating feeding tube in nose   Objective 101/77 (!) 113 97.6 F (36.4 C) (Oral) (!) 26 94%  Intake/Output Summary (Last 24 hours) at 12/09/2017 0950 Last data filed at 12/09/2017 0933 Gross per 24 hour  Intake 324.58 ml  Output 2250 ml  Net -1925.42 ml   Abdomen soft non tender Right groin copious sero sanguinous fluid no hematoma Left foot dry gangrene of toes  Assessment/Planning: S/p repair right CFA now with increasing drainage.  May need debridement next week.  Very concerning that aortobifem may be at risk.  Will consider antibiotics if febrile or WBC increased but would be very high risk for c diff otherwise GI bleed hemoglobin pending this morning  Husband updated at bedside regarding GI issues possible upper source and treatment with protonix Also discussed possibility that right groin may need further debridement  [name] 12/09/2017 9:50 AM --  Laboratory Lab Results: Recent Labs    12/07/17 0820 12/08/17 0747  WBC  --  8.5  HGB 6.9* 10.6*  HCT 22.4* 33.3*  PLT  --  398   BMET Recent Labs    12/06/17 1425  12/07/17 0100 12/07/17 0500  NA 137  --   --  140  K 4.3  --   --  4.2  CL 104  --   --  109  CO2 25  --   --  27  GLUCOSE 177*   < > 118* 117*  BUN 33*  --   --  35*  CREATININE 0.72  --   --  0.75  CALCIUM 8.0*  --   --  8.0*   < > = values in this interval not displayed.    COAG Lab Results  Component Value Date   INR 1.60 (H) 12/11/2015   INR 1.07 08/19/2015   INR 1.01 06/05/2015   No results found for: PTT

## 2017-12-10 ENCOUNTER — Inpatient Hospital Stay (HOSPITAL_COMMUNITY): Payer: Medicare Other

## 2017-12-10 LAB — BASIC METABOLIC PANEL
Anion gap: 14 (ref 5–15)
BUN: 50 mg/dL — AB (ref 6–20)
CHLORIDE: 101 mmol/L (ref 101–111)
CO2: 24 mmol/L (ref 22–32)
Calcium: 7.8 mg/dL — ABNORMAL LOW (ref 8.9–10.3)
Creatinine, Ser: 1.6 mg/dL — ABNORMAL HIGH (ref 0.44–1.00)
GFR calc Af Amer: 36 mL/min — ABNORMAL LOW (ref >60)
GFR, EST NON AFRICAN AMERICAN: 31 mL/min — AB (ref >60)
Glucose, Bld: 284 mg/dL — ABNORMAL HIGH (ref 65–99)
Potassium: 3.8 mmol/L (ref 3.5–5.1)
SODIUM: 139 mmol/L (ref 135–145)

## 2017-12-10 LAB — CBC
HCT: 34 % — ABNORMAL LOW (ref 36.0–46.0)
HCT: 33.1 % — ABNORMAL LOW (ref 36.0–46.0)
HEMOGLOBIN: 10.8 g/dL — AB (ref 12.0–15.0)
Hemoglobin: 10.6 g/dL — ABNORMAL LOW (ref 12.0–15.0)
MCH: 28.2 pg (ref 26.0–34.0)
MCH: 27.9 pg (ref 26.0–34.0)
MCHC: 31.8 g/dL (ref 30.0–36.0)
MCHC: 32 g/dL (ref 30.0–36.0)
MCV: 88.8 fL (ref 78.0–100.0)
MCV: 87.1 fL (ref 78.0–100.0)
PLATELETS: 461 10*3/uL — AB (ref 150–400)
Platelets: 416 10*3/uL — ABNORMAL HIGH (ref 150–400)
RBC: 3.83 MIL/uL — AB (ref 3.87–5.11)
RBC: 3.8 MIL/uL — ABNORMAL LOW (ref 3.87–5.11)
RDW: 18.6 % — ABNORMAL HIGH (ref 11.5–15.5)
RDW: 18.6 % — AB (ref 11.5–15.5)
WBC: 15.9 10*3/uL — ABNORMAL HIGH (ref 4.0–10.5)
WBC: 27.6 10*3/uL — AB (ref 4.0–10.5)

## 2017-12-10 LAB — GLUCOSE, CAPILLARY
GLUCOSE-CAPILLARY: 235 mg/dL — AB (ref 65–99)
GLUCOSE-CAPILLARY: 281 mg/dL — AB (ref 65–99)
Glucose-Capillary: 274 mg/dL — ABNORMAL HIGH (ref 65–99)
Glucose-Capillary: 307 mg/dL — ABNORMAL HIGH (ref 65–99)
Glucose-Capillary: 186 mg/dL — ABNORMAL HIGH (ref 65–99)
Glucose-Capillary: 178 mg/dL — ABNORMAL HIGH (ref 65–99)
Glucose-Capillary: 267 mg/dL — ABNORMAL HIGH (ref 65–99)

## 2017-12-10 LAB — LACTIC ACID, PLASMA: Lactic Acid, Venous: 1.8 mmol/L (ref 0.5–1.9)

## 2017-12-10 MED ORDER — SODIUM CHLORIDE 0.9 % IV SOLN
Freq: Once | INTRAVENOUS | Status: AC
  Administered 2017-12-11: 01:00:00 via INTRAVENOUS

## 2017-12-10 MED ORDER — VANCOMYCIN HCL IN DEXTROSE 750-5 MG/150ML-% IV SOLN
750.0000 mg | Freq: Two times a day (BID) | INTRAVENOUS | Status: DC
  Administered 2017-12-10 – 2017-12-12 (×6): 750 mg via INTRAVENOUS
  Filled 2017-12-10 (×7): qty 150

## 2017-12-10 MED ORDER — PIPERACILLIN-TAZOBACTAM 3.375 G IVPB
3.3750 g | Freq: Three times a day (TID) | INTRAVENOUS | Status: DC
Start: 2017-12-10 — End: 2017-12-13
  Administered 2017-12-10 – 2017-12-13 (×9): 3.375 g via INTRAVENOUS
  Filled 2017-12-10 (×11): qty 50

## 2017-12-10 MED ORDER — ONDANSETRON HCL 4 MG/2ML IJ SOLN
4.0000 mg | INTRAMUSCULAR | Status: DC | PRN
  Administered 2017-12-10 (×3): 4 mg via INTRAVENOUS
  Filled 2017-12-10 (×3): qty 2

## 2017-12-10 MED ORDER — GLUCERNA 1.2 CAL PO LIQD
1000.0000 mL | ORAL | Status: DC
  Filled 2017-12-10 (×4): qty 1000

## 2017-12-10 MED ORDER — SODIUM CHLORIDE 0.9 % IV SOLN
INTRAVENOUS | Status: DC
  Administered 2017-12-11 (×2): via INTRAVENOUS

## 2017-12-10 MED ORDER — MEPERIDINE HCL 50 MG PO TABS
100.0000 mg | ORAL_TABLET | ORAL | Status: DC | PRN
  Administered 2017-12-10 (×3): 100 mg via ORAL
  Filled 2017-12-10 (×3): qty 2

## 2017-12-10 NOTE — Progress Notes (Signed)
Called to see pt for lethargy and tachycardia.  Pt noted to have abdominal distention.  Tube feeds were stopped.  She is able to answer simple questions.  She vomited a large amount of dark colored fluid during obtaining a chest xray but did not obviously aspirate.  She vomited last evening as well.  PE:  Vitals:   12/10/17 1500 12/10/17 2002 12/10/17 2226 12/10/17 2300  BP:      Pulse: (!) 121 (!) 126  (!) 59  Resp: (!) 23 (!) 38  (!) 40  Temp: 100.2 F (37.9 C) 97.6 F (36.4 C) 100.1 F (37.8 C)   TempSrc: Oral Oral Rectal   SpO2: (!) 87% 93%  99%  Weight:      Height:       HEENT: dark fluid in mouth Abdomen: markedly distended, NG tube placed with immediate removal of about 500 cc dark fluid Rectal: dark stool no impaction Extremities: right groin wound with early skin infarction otherwise similar to earlier today  CBC    Component Value Date/Time   WBC 27.6 (H) 12/10/2017 2255   RBC 3.80 (L) 12/10/2017 2255   HGB 10.6 (L) 12/10/2017 2255   HCT 33.1 (L) 12/10/2017 2255   HCT 27.2 (L) 12/14/2015 0445   PLT 461 (H) 12/10/2017 2255   MCV 87.1 12/10/2017 2255   MCH 27.9 12/10/2017 2255   MCHC 32.0 12/10/2017 2255   RDW 18.6 (H) 12/10/2017 2255   LYMPHSABS 0.7 12/08/2017 0747   MONOABS 0.9 12/08/2017 0747   EOSABS 0.1 12/08/2017 0747   BASOSABS 0.1 12/08/2017 0747    BMET    Component Value Date/Time   NA 139 12/10/2017 2255   K 3.8 12/10/2017 2255   CL 101 12/10/2017 2255   CO2 24 12/10/2017 2255   GLUCOSE 284 (H) 12/10/2017 2255   BUN 50 (H) 12/10/2017 2255   CREATININE 1.60 (H) 12/10/2017 2255   CALCIUM 7.8 (L) 12/10/2017 2255   GFRNONAA 31 (L) 12/10/2017 2255   GFRAA 36 (L) 12/10/2017 2255    Chest xray right lower lobe infiltrate Abdomen: multiple dilated loops of small and large bowel  A: 1. Ileus unknown etiology.  Will stop tube feeds and continue NG for now  2.  Tachycardia fever most likely aspiration pneumonia from vomiting event yesterday or  early sepsis related to her groin.  Continue broad spectrum antibiotics.  Currently oxygenating ok.  3. Sepsis will give fluid bolus now and resume IV fluids for hydration as BUN/Cr also show hypovolemia  4. Transfer to ICU  Husband updated by phone  [name], MD [facility] Office: [phone] Pager: [phone]

## 2017-12-10 NOTE — Significant Event (Signed)
Rapid Response Event Note  Overview: Tachycardiac, Tachypnea, Febrile, Increased WOB  Initial Focused Assessment: Called by charge RN. Per RNs, patient is now febrile, has been tachycardiac, and is tachypneic. Upon arrival, patient is clammy and diaphoretic, neuro exam A/O x 2, skin is cool to touch, RN able to doppler pulses in LLE. RR in the 40-45, increased work of breathing, sats are > 93% on 4L (patient is requiring more oxygen that she previously did). Per RN, orders for not to check BP Patient appears lethargic, but per RN this is how the patient is at night. Blood sugar is normal. I am concerned that the patient will tire out from a respiratory perspective. Abdomen is very tight and distended, tender to touch. RN called MD prior to my arrival, MD ordered transfer to ICU and MD came to assess patient.   Interventions: - STAT LABS (BMP, CBC, LACTIC) - STAT CXR - ABG ? Will defer to MD for now.  - MD arrived and ordered KUB  - Patient vomited during manipulation while getting xrays, HOB raised, suctioned, NGT placed by MD. - Oxygen increased to [facility] for oxygenation saturations 86%, improved to 92%.  Plan of Care: - Transfer to [facility] CVICU per Dr. Darrick Penna.  Event Summary:  Call Time 2223 Arrival Time 2235 End Time 0000   Natayla Cadenhead R

## 2017-12-10 NOTE — Progress Notes (Addendum)
  Progress Note    12/10/2017 8:36 AM 18 Days Post-Op  Subjective:  Patient have nausea overnight   Vitals:   12/10/17 0414 12/10/17 0745  BP:  97/81  Pulse: (!) 124 (!) 119  Resp: 20 (!) 31  Temp: 97.6 F (36.4 C) (!) 97.5 F (36.4 C)  SpO2: 93% 94%   Physical Exam: Lungs:  Non labored Incisions:  R groin incision with serosanguinous collection on dressing, soft Extremities:  L foot with dry gangrenous toes; brisk L peroneal signal by doppler Abdomen:  Soft; non tender Neurologic: A&O  CBC    Component Value Date/Time   WBC 15.9 (H) 12/10/2017 0343   RBC 3.83 (L) 12/10/2017 0343   HGB 10.8 (L) 12/10/2017 0343   HCT 34.0 (L) 12/10/2017 0343   HCT 27.2 (L) 12/14/2015 0445   PLT 416 (H) 12/10/2017 0343   MCV 88.8 12/10/2017 0343   MCH 28.2 12/10/2017 0343   MCHC 31.8 12/10/2017 0343   RDW 18.6 (H) 12/10/2017 0343   LYMPHSABS 0.7 12/08/2017 0747   MONOABS 0.9 12/08/2017 0747   EOSABS 0.1 12/08/2017 0747   BASOSABS 0.1 12/08/2017 0747    BMET    Component Value Date/Time   NA 139 12/09/2017 1033   K 3.8 12/09/2017 1033   CL 104 12/09/2017 1033   CO2 26 12/09/2017 1033   GLUCOSE 213 (H) 12/09/2017 1033   BUN 31 (H) 12/09/2017 1033   CREATININE 0.84 12/09/2017 1033   CALCIUM 8.0 (L) 12/09/2017 1033   GFRNONAA >60 12/09/2017 1033   GFRAA >60 12/09/2017 1033    INR    Component Value Date/Time   INR 1.60 (H) 12/11/2015 0930     Intake/Output Summary (Last 24 hours) at 12/10/2017 0836 Last data filed at 12/10/2017 6389 Gross per 24 hour  Intake 1641.33 ml  Output 2700 ml  Net -1058.67 ml     Assessment/Plan:  72 y.o. female is s/p repair R CFA with increasing drainage 18 Days Post-Op   WBC increased today; pharmacy consulted to dose vanc/zosyn; high risk for ABF bypass infection; check CXR GI: Hgb stable; continue to monitor Perfusing L foot; toes are well demarcated; continue doppler checks L peroneal Will adjust NG feeding rate given N/V  overnight Keep NPO past midnight in case patient will need groin debridement in OR  Emilie Rutter, PA-C [facility] [phone] 12/10/2017 8:36 AM  Agree with above.  Early skin necrosis of right groin with continued serosanguinous drainage.  Will discuss with Dr Arbie Cookey I and D in OR tomorrow.  NPO p midnight. In light of leukocytosis and aortobifem bypass I think she needs broad spectrum antibiotics.  I updated pt husband that she is at high risk for graft infection and that if her graft becomes infected she would probably not survive.  Abdominal distention and nausea will half tube feed rate today  [name], MD [facility] [place] Office: [phone] Pager: [phone]

## 2017-12-10 NOTE — Progress Notes (Signed)
2200 this RN assessed the pt and found her diaphoretic,  to have a respiratory rate in the 40's, heart rate in the 120's-130's and to be sating in the 80's. RN obtained oral temp (97.5). RN applied O2 at 4L and obtained a rectal temp. (100.1). Pt oriented x2 but more lethargic. Pt abdomen is distended and taunt. RN had charge RN call Rapid response while this RN called the MD. Orders to transfer pt to [facility] ICU per MD. MD on the way to bedside.                    :

## 2017-12-10 NOTE — Progress Notes (Signed)
Pharmacy Antibiotic Note  [name] is a 72 y.o. female admitted on 11/09/2017 s/p repair of CFA with increasing drainage. Pharmacy has been consulted for vancomycin and zosyn dosing. WBC increased today to 15.9, patient is afebrile at this time. Noted patient may need debridement of R groin this week.  Follow for further infectious workup.   Plan: Vancomycin 750 IV every 12 hours.  Goal trough 10-15 mcg/mL. Zosyn 3.375g IV q8h Follow for cultures, LOT, clinical resolution   Height: 5\' 4"  (162.6 cm) Weight: 236 lb (107 kg) IBW/kg (Calculated) : 54.7  Temp (24hrs), Avg:97.7 F (36.5 C), Min:97.5 F (36.4 C), Max:98.1 F (36.7 C)  Recent Labs  Lab 12/04/17 0416  12/06/17 0422 12/06/17 0549 12/06/17 0653 12/06/17 1425 12/07/17 0500 12/08/17 0747 12/09/17 1033 12/10/17 0343  WBC 8.5  --  10.2  --   --   --   --  8.5 7.9 15.9*  CREATININE 0.75   < > 0.88 0.80 0.76 0.72 0.75  --  0.84  --    < > = values in this interval not displayed.    Estimated Creatinine Clearance: 72.3 mL/min (by C-G formula based on SCr of 0.84 mg/dL).    Allergies  Allergen Reactions  . Acetaminophen Nausea Only and Other (See Comments)    Does not tolerate well, nausea  . Ceftriaxone Itching    Pt has tolerated amoxicillin and cephalexin in the past.  . Codeine Nausea And Vomiting  . Erythromycin Nausea And Vomiting  . Hydrocodone-Acetaminophen Nausea And Vomiting  . Hydromorphone Nausea And Vomiting  . Propoxyphene Hcl Nausea And Vomiting  . Statins Other (See Comments)    Leg myalgias  . Ibuprofen Other (See Comments)    Unspecified  Per Pt "Does not tolerate well"  . Shellfish-Derived Products Other (See Comments)    "pt does not eat" Unspecified reaction    Antimicrobials this admission: Vanc 6/9>> Zosyn 6/9>>  Also received bactrim this admission for UTI    Microbiology results: No cultures at this time   Blake Divine, Pharm.D. PGY1 Pharmacy Resident 12/10/2017 9:27  AM Phone: [phone]

## 2017-12-11 ENCOUNTER — Inpatient Hospital Stay (HOSPITAL_COMMUNITY): Payer: Medicare Other

## 2017-12-11 ENCOUNTER — Inpatient Hospital Stay (HOSPITAL_COMMUNITY): Payer: Medicare Other | Admitting: Anesthesiology

## 2017-12-11 ENCOUNTER — Encounter (HOSPITAL_COMMUNITY): Admission: RE | Disposition: E | Payer: Self-pay | Source: Home / Self Care | Attending: Surgery

## 2017-12-11 DIAGNOSIS — J690 Pneumonitis due to inhalation of food and vomit: Secondary | ICD-10-CM

## 2017-12-11 DIAGNOSIS — N179 Acute kidney failure, unspecified: Secondary | ICD-10-CM

## 2017-12-11 DIAGNOSIS — I9789 Other postprocedural complications and disorders of the circulatory system, not elsewhere classified: Secondary | ICD-10-CM

## 2017-12-11 DIAGNOSIS — A419 Sepsis, unspecified organism: Secondary | ICD-10-CM

## 2017-12-11 DIAGNOSIS — R6521 Severe sepsis with septic shock: Secondary | ICD-10-CM

## 2017-12-11 DIAGNOSIS — K567 Ileus, unspecified: Secondary | ICD-10-CM

## 2017-12-11 HISTORY — PX: GROIN DEBRIDEMENT: SHX5159

## 2017-12-11 LAB — GLUCOSE, CAPILLARY
GLUCOSE-CAPILLARY: 149 mg/dL — AB (ref 65–99)
GLUCOSE-CAPILLARY: 288 mg/dL — AB (ref 65–99)
GLUCOSE-CAPILLARY: 87 mg/dL (ref 65–99)
Glucose-Capillary: >600 mg/dL (ref 65–99)
Glucose-Capillary: 246 mg/dL — ABNORMAL HIGH (ref 65–99)
Glucose-Capillary: 247 mg/dL — ABNORMAL HIGH (ref 65–99)
Glucose-Capillary: 204 mg/dL — ABNORMAL HIGH (ref 65–99)
Glucose-Capillary: 260 mg/dL — ABNORMAL HIGH (ref 65–99)

## 2017-12-11 LAB — POCT I-STAT 3, ART BLOOD GAS (G3+)
Acid-base deficit: 2 mmol/L (ref 0.0–2.0)
Bicarbonate: 23.4 mmol/L (ref 20.0–28.0)
O2 Saturation: 100 %
PCO2 ART: 43.8 mmHg (ref 32.0–48.0)
PH ART: 7.335 — AB (ref 7.350–7.450)
Patient temperature: 98.6
TCO2: 25 mmol/L (ref 22–32)
pO2, Arterial: 188 mmHg — ABNORMAL HIGH (ref 83.0–108.0)

## 2017-12-11 LAB — CORTISOL: CORTISOL PLASMA: 41.5 ug/dL

## 2017-12-11 LAB — CBC
HCT: 30.1 % — ABNORMAL LOW (ref 36.0–46.0)
HCT: 31.3 % — ABNORMAL LOW (ref 36.0–46.0)
Hemoglobin: 9.5 g/dL — ABNORMAL LOW (ref 12.0–15.0)
Hemoglobin: 9.8 g/dL — ABNORMAL LOW (ref 12.0–15.0)
MCH: 27.9 pg (ref 26.0–34.0)
MCH: 27.8 pg (ref 26.0–34.0)
MCHC: 31.6 g/dL (ref 30.0–36.0)
MCHC: 31.3 g/dL (ref 30.0–36.0)
MCV: 88.5 fL (ref 78.0–100.0)
MCV: 88.7 fL (ref 78.0–100.0)
PLATELETS: 381 10*3/uL (ref 150–400)
Platelets: 409 10*3/uL — ABNORMAL HIGH (ref 150–400)
RBC: 3.4 MIL/uL — ABNORMAL LOW (ref 3.87–5.11)
RBC: 3.53 MIL/uL — AB (ref 3.87–5.11)
RDW: 18.8 % — ABNORMAL HIGH (ref 11.5–15.5)
RDW: 19 % — ABNORMAL HIGH (ref 11.5–15.5)
WBC: 27.1 10*3/uL — ABNORMAL HIGH (ref 4.0–10.5)
WBC: 28.5 10*3/uL — ABNORMAL HIGH (ref 4.0–10.5)

## 2017-12-11 LAB — URINALYSIS, ROUTINE W REFLEX MICROSCOPIC
Bilirubin Urine: NEGATIVE
GLUCOSE, UA: NEGATIVE mg/dL
KETONES UR: NEGATIVE mg/dL
NITRITE: NEGATIVE
PROTEIN: 30 mg/dL — AB
Specific Gravity, Urine: 1.019 (ref 1.005–1.030)
pH: 5 (ref 5.0–8.0)

## 2017-12-11 LAB — SODIUM, URINE, RANDOM

## 2017-12-11 LAB — LACTIC ACID, PLASMA: LACTIC ACID, VENOUS: 3.1 mmol/L — AB (ref 0.5–1.9)

## 2017-12-11 LAB — CREATININE, URINE, RANDOM: CREATININE, URINE: 50.21 mg/dL

## 2017-12-11 LAB — HEMOGLOBIN AND HEMATOCRIT, BLOOD
HCT: 26.1 % — ABNORMAL LOW (ref 36.0–46.0)
HEMOGLOBIN: 8.1 g/dL — AB (ref 12.0–15.0)

## 2017-12-11 LAB — PROCALCITONIN: PROCALCITONIN: 7.88 ng/mL

## 2017-12-11 SURGERY — DEBRIDEMENT, INGUINAL REGION
Anesthesia: General | Laterality: Right

## 2017-12-11 SURGERY — Surgical Case
Anesthesia: *Unknown

## 2017-12-11 MED ORDER — PHENYLEPHRINE HCL 10 MG/ML IJ SOLN
INTRAMUSCULAR | Status: DC | PRN
  Administered 2017-12-11: 120 ug via INTRAVENOUS
  Administered 2017-12-11: 160 ug via INTRAVENOUS

## 2017-12-11 MED ORDER — FENTANYL CITRATE (PF) 250 MCG/5ML IJ SOLN
INTRAMUSCULAR | Status: DC | PRN
  Administered 2017-12-11: 25 ug via INTRAVENOUS

## 2017-12-11 MED ORDER — LABETALOL HCL 5 MG/ML IV SOLN
INTRAVENOUS | Status: DC | PRN
  Administered 2017-12-11: 5 mg via INTRAVENOUS

## 2017-12-11 MED ORDER — PHENYLEPHRINE HCL 10 MG/ML IJ SOLN
INTRAVENOUS | Status: DC | PRN
  Administered 2017-12-11: 40 ug/min via INTRAVENOUS

## 2017-12-11 MED ORDER — PHENYLEPHRINE 40 MCG/ML (10ML) SYRINGE FOR IV PUSH (FOR BLOOD PRESSURE SUPPORT)
PREFILLED_SYRINGE | INTRAVENOUS | Status: AC
  Filled 2017-12-11: qty 10

## 2017-12-11 MED ORDER — FENTANYL CITRATE (PF) 100 MCG/2ML IJ SOLN
25.0000 ug | INTRAMUSCULAR | Status: DC | PRN
  Administered 2017-12-11 (×2): 50 ug via INTRAVENOUS
  Filled 2017-12-11 (×2): qty 2

## 2017-12-11 MED ORDER — EPINEPHRINE PF 1 MG/ML IJ SOLN
INTRAMUSCULAR | Status: DC | PRN
  Administered 2017-12-11: 1 mg via INTRAVENOUS

## 2017-12-11 MED ORDER — INSULIN ASPART 100 UNIT/ML ~~LOC~~ SOLN
0.0000 [IU] | SUBCUTANEOUS | Status: DC
  Administered 2017-12-11: 2 [IU] via SUBCUTANEOUS
  Administered 2017-12-12 (×3): 3 [IU] via SUBCUTANEOUS
  Administered 2017-12-13: 11 [IU] via SUBCUTANEOUS
  Administered 2017-12-13: 3 [IU] via SUBCUTANEOUS

## 2017-12-11 MED ORDER — ORAL CARE MOUTH RINSE
15.0000 mL | OROMUCOSAL | Status: DC
  Administered 2017-12-11 – 2017-12-13 (×15): 15 mL via OROMUCOSAL

## 2017-12-11 MED ORDER — MIDAZOLAM HCL 2 MG/2ML IJ SOLN
INTRAMUSCULAR | Status: AC
  Filled 2017-12-11: qty 2

## 2017-12-11 MED ORDER — LIDOCAINE 2% (20 MG/ML) 5 ML SYRINGE
INTRAMUSCULAR | Status: DC | PRN
  Administered 2017-12-11: 60 mg via INTRAVENOUS

## 2017-12-11 MED ORDER — LACTATED RINGERS IV SOLN
INTRAVENOUS | Status: DC
  Administered 2017-12-11: 15:00:00 via INTRAVENOUS

## 2017-12-11 MED ORDER — NOREPINEPHRINE 4 MG/250ML-% IV SOLN
0.0000 ug/min | INTRAVENOUS | Status: DC
  Administered 2017-12-11: 4 ug/min via INTRAVENOUS
  Administered 2017-12-12 – 2017-12-13 (×3): 8 ug/min via INTRAVENOUS
  Filled 2017-12-11 (×4): qty 250

## 2017-12-11 MED ORDER — VASOPRESSIN 20 UNIT/ML IV SOLN
INTRAVENOUS | Status: AC
  Filled 2017-12-11: qty 1

## 2017-12-11 MED ORDER — FENTANYL CITRATE (PF) 250 MCG/5ML IJ SOLN
INTRAMUSCULAR | Status: AC
  Filled 2017-12-11: qty 5

## 2017-12-11 MED ORDER — FENTANYL CITRATE (PF) 100 MCG/2ML IJ SOLN
50.0000 ug | INTRAMUSCULAR | Status: DC | PRN
  Administered 2017-12-12 (×2): 50 ug via INTRAVENOUS
  Filled 2017-12-11 (×2): qty 2

## 2017-12-11 MED ORDER — NOREPINEPHRINE BITARTRATE 1 MG/ML IV SOLN
INTRAVENOUS | Status: DC | PRN
  Administered 2017-12-11: 8 ug/min via INTRAVENOUS

## 2017-12-11 MED ORDER — ONDANSETRON HCL 4 MG/2ML IJ SOLN
INTRAMUSCULAR | Status: DC | PRN
  Administered 2017-12-11: 4 mg via INTRAVENOUS

## 2017-12-11 MED ORDER — VASOPRESSIN 20 UNIT/ML IV SOLN
INTRAVENOUS | Status: DC | PRN
  Administered 2017-12-11: 3 [IU] via INTRAVENOUS

## 2017-12-11 MED ORDER — CHLORHEXIDINE GLUCONATE 0.12% ORAL RINSE (MEDLINE KIT)
15.0000 mL | Freq: Two times a day (BID) | OROMUCOSAL | Status: DC
  Administered 2017-12-11 – 2017-12-13 (×4): 15 mL via OROMUCOSAL

## 2017-12-11 MED ORDER — 0.9 % SODIUM CHLORIDE (POUR BTL) OPTIME
TOPICAL | Status: DC | PRN
  Administered 2017-12-11: 1000 mL

## 2017-12-11 MED ORDER — LIDOCAINE 2% (20 MG/ML) 5 ML SYRINGE
INTRAMUSCULAR | Status: AC
  Filled 2017-12-11: qty 5

## 2017-12-11 MED ORDER — ETOMIDATE 2 MG/ML IV SOLN
INTRAVENOUS | Status: DC | PRN
  Administered 2017-12-11: 12 mg via INTRAVENOUS

## 2017-12-11 MED ORDER — FENTANYL CITRATE (PF) 100 MCG/2ML IJ SOLN
50.0000 ug | INTRAMUSCULAR | Status: DC | PRN
  Administered 2017-12-12: 50 ug via INTRAVENOUS
  Filled 2017-12-11: qty 2

## 2017-12-11 MED ORDER — SODIUM CHLORIDE 0.9 % IV BOLUS
1000.0000 mL | Freq: Once | INTRAVENOUS | Status: AC
  Administered 2017-12-11: 1000 mL via INTRAVENOUS

## 2017-12-11 MED ORDER — ROCURONIUM BROMIDE 10 MG/ML (PF) SYRINGE
PREFILLED_SYRINGE | INTRAVENOUS | Status: AC
  Filled 2017-12-11: qty 5

## 2017-12-11 MED ORDER — BISACODYL 10 MG RE SUPP: 10.0000 mg | Freq: Every day | RECTAL | Status: DC | PRN

## 2017-12-11 MED ORDER — DEXMEDETOMIDINE HCL IN NACL 400 MCG/100ML IV SOLN
0.4000 ug/kg/h | INTRAVENOUS | Status: DC
  Administered 2017-12-11: 0.7 ug/kg/h via INTRAVENOUS
  Administered 2017-12-12 (×3): 1 ug/kg/h via INTRAVENOUS
  Administered 2017-12-12: 0.7 ug/kg/h via INTRAVENOUS
  Administered 2017-12-12 – 2017-12-13 (×4): 1 ug/kg/h via INTRAVENOUS
  Filled 2017-12-11 (×9): qty 100

## 2017-12-11 MED ORDER — SUCCINYLCHOLINE CHLORIDE 200 MG/10ML IV SOSY
PREFILLED_SYRINGE | INTRAVENOUS | Status: DC | PRN
  Administered 2017-12-11: 80 mg via INTRAVENOUS

## 2017-12-11 MED ORDER — PROPOFOL 10 MG/ML IV BOLUS
INTRAVENOUS | Status: AC
  Filled 2017-12-11: qty 20

## 2017-12-11 MED ORDER — DEXMEDETOMIDINE HCL IN NACL 200 MCG/50ML IV SOLN
INTRAVENOUS | Status: AC
  Filled 2017-12-11: qty 50

## 2017-12-11 MED ORDER — MIDAZOLAM HCL 2 MG/2ML IJ SOLN
INTRAMUSCULAR | Status: DC | PRN
  Administered 2017-12-11: 0.5 mg via INTRAVENOUS

## 2017-12-11 MED ORDER — LABETALOL HCL 5 MG/ML IV SOLN
INTRAVENOUS | Status: AC
  Filled 2017-12-11: qty 4

## 2017-12-11 MED ORDER — MINERAL OIL RE ENEM
1.0000 | ENEMA | Freq: Once | RECTAL | Status: AC
  Administered 2017-12-11: 1 via RECTAL
  Filled 2017-12-11: qty 1

## 2017-12-11 SURGICAL SUPPLY — 30 items
BANDAGE ACE 4X5 VEL STRL LF (GAUZE/BANDAGES/DRESSINGS) IMPLANT
BANDAGE ACE 6X5 VEL STRL LF (GAUZE/BANDAGES/DRESSINGS) IMPLANT
BNDG GAUZE ELAST 4 BULKY (GAUZE/BANDAGES/DRESSINGS) IMPLANT
CANISTER SUCT 3000ML PPV (MISCELLANEOUS) ×3 IMPLANT
CANISTER WOUND CARE 500ML ATS (WOUND CARE) IMPLANT
CLIP LIGATING EXTRA MED SLVR (CLIP) ×3 IMPLANT
CLIP LIGATING EXTRA SM BLUE (MISCELLANEOUS) ×3 IMPLANT
DRAPE INCISE IOBAN 66X45 STRL (DRAPES) ×2 IMPLANT
DRSG VAC ATS MED SENSATRAC (GAUZE/BANDAGES/DRESSINGS) IMPLANT
DRSG VAC ATS SM SENSATRAC (GAUZE/BANDAGES/DRESSINGS) ×2 IMPLANT
ELECT REM PT RETURN 9FT ADLT (ELECTROSURGICAL) ×3
ELECTRODE REM PT RTRN 9FT ADLT (ELECTROSURGICAL) ×1 IMPLANT
GAUZE SPONGE 4X4 12PLY STRL (GAUZE/BANDAGES/DRESSINGS) ×3 IMPLANT
GLOVE SS BIOGEL STRL SZ 7.5 (GLOVE) ×1 IMPLANT
GLOVE SUPERSENSE BIOGEL SZ 7.5 (GLOVE) ×2
GOWN STRL REUS W/ TWL LRG LVL3 (GOWN DISPOSABLE) ×3 IMPLANT
GOWN STRL REUS W/TWL LRG LVL3 (GOWN DISPOSABLE) ×9
KIT BASIN OR (CUSTOM PROCEDURE TRAY) ×3 IMPLANT
KIT DRAINAGE VACCUM ASSIST (KITS) ×2 IMPLANT
NS IRRIG 1000ML POUR BTL (IV SOLUTION) ×3 IMPLANT
PACK GENERAL/GYN (CUSTOM PROCEDURE TRAY) ×3 IMPLANT
PACK UNIVERSAL I (CUSTOM PROCEDURE TRAY) ×3 IMPLANT
PAD ARMBOARD 7.5X6 YLW CONV (MISCELLANEOUS) ×6 IMPLANT
STAPLER VISISTAT 35W (STAPLE) IMPLANT
SUT ETHILON 3 0 PS 1 (SUTURE) IMPLANT
SUT VIC AB 2-0 CTX 36 (SUTURE) IMPLANT
SUT VIC AB 3-0 SH 27 (SUTURE)
SUT VIC AB 3-0 SH 27X BRD (SUTURE) IMPLANT
TOWEL GREEN STERILE (TOWEL DISPOSABLE) ×3 IMPLANT
WATER STERILE IRR 1000ML POUR (IV SOLUTION) ×3 IMPLANT

## 2017-12-11 NOTE — Anesthesia Preprocedure Evaluation (Addendum)
Anesthesia Evaluation  Patient identified by MRN, date of birth, ID band Patient confused    Reviewed: Patient's Chart, lab work & pertinent test results  Airway Mallampati: III  TM Distance: <3 FB Neck ROM: Full    Dental  (+) Poor Dentition   Pulmonary pneumonia, former smoker,   tacypneic  + rhonchi  + decreased breath sounds      Cardiovascular hypertension, + CAD, + Past MI, + Peripheral Vascular Disease and +CHF   Rhythm:Regular Rate:Tachycardia  BP has been unobtainable   Neuro/Psych    GI/Hepatic GERD  ,Likely evolving ileus and or SBO   Endo/Other  diabetes  Renal/GU Renal disease     Musculoskeletal   Abdominal (+) + obese,   Peds  Hematology  (+) anemia ,   Anesthesia Other Findings   Reproductive/Obstetrics                            Anesthesia Physical Anesthesia Plan  ASA: IV  Anesthesia Plan: General   Post-op Pain Management:    Induction: Intravenous  PONV Risk Score and Plan: 3 and Treatment may vary due to age or medical condition and Ondansetron  Airway Management Planned: Oral ETT  Additional Equipment:   Intra-op Plan:   Post-operative Plan: Extubation in OR and Possible Post-op intubation/ventilation  Informed Consent: I have reviewed the patients History and Physical, chart, labs and discussed the procedure including the risks, benefits and alternatives for the proposed anesthesia with the patient or authorized representative who has indicated his/her understanding and acceptance.   Dental advisory given  Plan Discussed with:   Anesthesia Plan Comments: (Extremely poor surgical candidate, possible postop ventilation discussed c husband)       Anesthesia Quick Evaluation

## 2017-12-11 NOTE — Progress Notes (Addendum)
  Progress Note    2018/01/09 9:10 AM 19 Days Post-Op  Subjective:  Less alert this morning; endorses abd pain   Vitals:   2018-01-09 0500 01-09-2018 0600  BP:    Pulse: (!) 115 (!) 102  Resp: (!) 28 (!) 37  Temp:    SpO2: 99% 99%   Physical Exam: Cardiac:  Tachycardia Incisions:  R groin incision with necrotic tissue around incision with serous and cloudy discharge Extremities:  Distal L foot cool to touch; venous like peroneal signal  Abdomen:  Mildly tense to palpation; tender with deep palpation Neurologic: less alert but still able to communicate verbally  CBC    Component Value Date/Time   WBC 28.5 (H) 09-Jan-2018 0430   RBC 3.53 (L) 01/09/18 0430   HGB 9.8 (L) 01/09/2018 0430   HCT 31.3 (L) 01-09-18 0430   HCT 27.2 (L) 12/14/2015 0445   PLT 381 01-09-18 0430   MCV 88.7 01-09-18 0430   MCH 27.8 01-09-2018 0430   MCHC 31.3 01-09-18 0430   RDW 19.0 (H) 01-09-18 0430   LYMPHSABS 0.7 12/08/2017 0747   MONOABS 0.9 12/08/2017 0747   EOSABS 0.1 12/08/2017 0747   BASOSABS 0.1 12/08/2017 0747    BMET    Component Value Date/Time   NA 139 12/10/2017 2255   K 3.8 12/10/2017 2255   CL 101 12/10/2017 2255   CO2 24 12/10/2017 2255   GLUCOSE 284 (H) 12/10/2017 2255   BUN 50 (H) 12/10/2017 2255   CREATININE 1.60 (H) 12/10/2017 2255   CALCIUM 7.8 (L) 12/10/2017 2255   GFRNONAA 31 (L) 12/10/2017 2255   GFRAA 36 (L) 12/10/2017 2255    INR    Component Value Date/Time   INR 1.60 (H) 12/11/2015 0930     Intake/Output Summary (Last 24 hours) at 01-09-18 0910 Last data filed at 2018/01/09 0600 Gross per 24 hour  Intake 1241.33 ml  Output -  Net 1241.33 ml     Assessment/Plan:  72 y.o. female is s/p repair R CFA with increasing drainage  19 Days Post-Op   - 200cc dark fluid out of NG since transfer to ICU; abd still somewhat tight, continue NG suction and NPO - WBC increased/tachycardic - continue vanc/zosyn dosed by pharmacy; unsure of etiology,  differential includes aspiration pneumonia and ABF bypass infection - Dr. Arbie Cookey planning on incision and drainage R groin today in OR - Continue doppler checks LLE; Dry gangrene toes L foot stable   Emilie Rutter, PA-C [facility] [phone] 2018-01-09 9:10 AM   I have examined the patient, reviewed and agree with above.  Very difficult situation with progressive overall clinical deterioration.  Cannot tolerate tube feeds.  KUB yesterday showed ileus versus small bowel obstruction.  We will keep NG tube to suction.  Has fat necrosis drainage from right groin with 3 cm area of obvious skin necrosis.  White blood cell count increased.  Had a long discussion with the patient's husband this morning.  Explained that her overall weakened status makes it very difficult for recovery.  Also explained that if she does have involvement of her aortofemoral bypass grafting that the definitive treatment would be removal of the graft which she obviously could not survive.  Will take to the operating room this afternoon for debridement and probable VAC placement.  Understands her critical condition and also discussed with the husband we may need to begin discussion of comfort care.  Gretta Began, MD 2018-01-09 10:09 AM

## 2017-12-11 NOTE — Progress Notes (Signed)
Patient transferred to short stay with no distress noted. Family at bedside.

## 2017-12-11 NOTE — Anesthesia Postprocedure Evaluation (Signed)
Anesthesia Post Note  Patient: [name]  Procedure(s) Performed: GROIN DEBRIDEMENT (Right )     Patient location during evaluation: SICU Anesthesia Type: General Level of consciousness: sedated and patient remains intubated per anesthesia plan Pain management: pain level controlled Vital Signs Assessment: post-procedure vital signs reviewed and stable Respiratory status: patient remains intubated per anesthesia plan Cardiovascular status: unstable Postop Assessment: no apparent nausea or vomiting Anesthetic complications: no Comments: Patient had very shallow breathing at end of surgery. Decision made to keep patient on full vent support post-op. When patient was  Moved to bed for transport to SICU, she had loss of palpable pulse with narrow complex tachycardia. She was given 1 mg epinephrine, chest compressions initiated with return of  L. femoral pulse after 2-3 minutes of chest compressions.    Last Vitals:  Vitals:   12/16/2017 1800 12/27/2017 1945  BP: (!) 104/55   Pulse:    Resp: (!) 30   Temp:  36.9 C  SpO2:      Last Pain:  Vitals:   12/30/2017 1945  TempSrc: Axillary  PainSc:                  Atley Neubert COKER

## 2017-12-11 NOTE — Op Note (Signed)
    OPERATIVE REPORT  DATE OF SURGERY: 12/04/2017  PATIENT: [name], 72 y.o. female MRN: [id number]  DOB: June 08, 1946  PRE-OPERATIVE DIAGNOSIS: Fat necrosis and infection right groin  POST-OPERATIVE DIAGNOSIS:  Same  PROCEDURE: Debridement of skin and subcutaneous tissue and right groin and VAC placement  SURGEON:  Gretta Began, M.D.  PHYSICIAN ASSISTANT: Nurse  ANESTHESIA: General  EBL: Minimal ml  Total I/O In: 511.7 [I.V.:361.7; IV Piggyback:150] Out: 1075 [Urine:600; Emesis/NG output:400; Other:75]  BLOOD ADMINISTERED: None  DRAINS: VAC  SPECIMEN: Aerobic and anaerobic cultures of drainage right groin  COUNTS CORRECT:  YES  PLAN OF CARE: PACU  PATIENT DISPOSITION:  PACU - hemodynamically stable  PROCEDURE DETAILS: Patient was taken to the operative placed in supine position where the area of the right groin was prepped and draped in usual sterile fashion.  The patient had approximately 3 cm area of necrotic skin and fat necrosis draining from this.  All nonviable skin was sharply debrided and the subcutaneous fat was also debrided.  Aerobic and anaerobic cultures of this area were sent for pathology.  The patient did have some tracking under the skin where the patient had a very large hematoma but there did not appear to be any necrotic tissue up under this undermined area.  The fat above the level of the fascia was all debrided.  There was no exposure of Dacron graft in the base of this although I was not particularly aggressive over the area where the Dacron was present.  A wound was irrigated with saline.  A VAC sponge was cut to the appropriate dimension and VAC dressing was placed in the wound.  Patient tolerated procedure without difficulty and was transferred to the recovery room in stable condition   Larina Earthly, M.D., [facility] 12/25/2017 4:09 PM

## 2017-12-11 NOTE — Progress Notes (Signed)
Physical Therapy Treatment Patient Details Name: [name] MRN: [id number] DOB: 1946-01-09 Today's Date: 12/30/2017    History of Present Illness [name] is a 72 y.o. female s/p R femoral graft repair followed by bleeding. PMHx: PVD, Rt index finger amp, Rt BKA, CHF, CAD, HLD, HTN, DM    PT Comments    Pt lethargic and disoriented to time and situation. Frequent cueing required for pt to keep eyes open. Pt had difficulty verbalizing answers to questions today and c/o pain but unable to state location. PT required max-total A for all bed mobility. Session focused on sitting balance and posture at EOB with pt demonstrating lack of ability to maintain sitting balance without assistance. Postural deficits detailed below.   Follow Up Recommendations  SNF;Supervision/Assistance - 24 hour     Equipment Recommendations  None recommended by PT    Recommendations for Other Services       Precautions / Restrictions Precautions Precautions: Fall Precaution Comments: L LE with necrosis at toes, right BKA, Pt lacks postural control in sitting and requires knee block Restrictions Weight Bearing Restrictions: No    Mobility  Bed Mobility Overal bed mobility: Needs Assistance Bed Mobility: Supine to Sit;Sit to Supine     Supine to sit: +2 for physical assistance;Max assist;HOB elevated Sit to supine: +2 for safety/equipment;Total assist   General bed mobility comments: for supine>sit, pt required max A x 2 to sit EOB with HOB elevated. When sitting EOB, pt required knee block to keep pt from sliding forward. For sit>supine, total A required for BLE and trunk.  Transfers                    Ambulation/Gait             General Gait Details: pt non-ambulatory   Stairs             Wheelchair Mobility    Modified Rankin (Stroke Patients Only)       Balance Overall balance assessment: Needs assistance Sitting-balance support: Feet  unsupported;Bilateral upper extremity supported Sitting balance-Leahy Scale: Poor Sitting balance - Comments: Pt sat EOB 10 min with max A to maintain sitting balance with knee block. Pt demonstrates L cervical lateral flexion, posterior pelvic tilt, forward head posture, and flexed trunk. When PT positioned pt, pt unable to maintain position for any length of time. Postural control: Posterior lean                                  Cognition Arousal/Alertness: Lethargic Behavior During Therapy: Flat affect Overall Cognitive Status: Impaired/Different from baseline Area of Impairment: Orientation;Attention;Following commands                 Orientation Level: Disoriented to;Time;Situation     Following Commands: Follows one step commands inconsistently;Follows one step commands with increased time       General Comments: Pt required frequent cueing to open eyes and has difficulty verbalizing answers to questions and following commands      Exercises      General Comments        Pertinent Vitals/Pain Pain Assessment: Faces Faces Pain Scale: Hurts little more Pain Location: Pt reports pain but unable to state location Pain Descriptors / Indicators: Grimacing Pain Intervention(s): Limited activity within patient's tolerance;Monitored during session;Repositioned    Home Living  Prior Function            PT Goals (current goals can now be found in the care plan section) Progress towards PT goals: Progressing toward goals    Frequency    Min 2X/week      PT Plan Current plan remains appropriate    Co-evaluation              AM-PAC PT "6 Clicks" Daily Activity  Outcome Measure  Difficulty turning over in bed (including adjusting bedclothes, sheets and blankets)?: Unable Difficulty moving from lying on back to sitting on the side of the bed? : Unable Difficulty sitting down on and standing up from a chair with  arms (e.g., wheelchair, bedside commode, etc,.)?: Unable Help needed moving to and from a bed to chair (including a wheelchair)?: Total Help needed walking in hospital room?: Total Help needed climbing 3-5 steps with a railing? : Total 6 Click Score: 6    End of Session Equipment Utilized During Treatment: Oxygen Activity Tolerance: Patient limited by lethargy;Patient limited by fatigue Patient left: in bed;with call bell/phone within reach Nurse Communication: Mobility status PT Visit Diagnosis: Unsteadiness on feet (R26.81);Muscle weakness (generalized) (M62.81);Difficulty in walking, not elsewhere classified (R26.2)     Time: 5176-1607 PT Time Calculation (min) (ACUTE ONLY): 26 min  Charges:  $Therapeutic Activity: 23-37 mins                    G Codes:       [name], SPT   [employer] 26-Dec-2017, 9:53 AM

## 2017-12-11 NOTE — Progress Notes (Addendum)
Inpatient Diabetes Program Recommendations  AACE/ADA: New Consensus Statement on Inpatient Glycemic Control (2015)  Target Ranges:  Prepandial:   less than 140 mg/dL      Peak postprandial:   less than 180 mg/dL (1-2 hours)      Critically ill patients:  140 - 180 mg/dL   Lab Results  Component Value Date   GLUCAP 247 (H) 12/21/2017   HGBA1C 6.1 (H) 12/14/2015    Review of Glycemic Control Results for [name], [name] (MRN [id number]) as of 12/28/2017 11:20  Ref. Range 12/10/2017 22:44 12/09/2017 00:07 12/26/2017 03:54 12/29/2017 08:08  Glucose-Capillary Latest Ref Range: 65 - 99 mg/dL 801 (H) 655 (H) 374 (H) 247 (H)    Diabetes history: Type 2 DM Outpatient Diabetes medications: Tresiba 16 units QAM, Metformin 500 mg BID Current orders for Inpatient glycemic control: Novolog 0-15 units TID  Inpatient Diabetes Program Recommendations:   Basal: Recommending adding back a portion of basal to establish improved glycemic control in preparation for I&D today, consider Lantus 8 units QD.   Thanks, Lujean Rave, MSN, RNC-OB Diabetes Coordinator [phone] (8a-5p)

## 2017-12-11 NOTE — Progress Notes (Signed)
Patient ID: [name], female   DOB: March 10, 1946, 72 y.o.   MRN: [id number] Patient had an attempt at extubation in the operating room.  Was making very poor respiratory effort.  Then had severe hypotension and had increase on need of Synephrine and levo fed and actually some epinephrine as well.  Had brief less than 1 minute CPR to circulate agents.  Maintain saturations throughout this.  Was transferred to the intensive care unit on a vent.  Currently hemodynamically stable.  Have consulted CCM for management of this critically ill patient.  I had a long discussion with the patient's husband, son and daughter and brother.  Explained that she does appear to have progression to multisystem failure with pulmonary and GI issues.  They agreed to medication for blood pressure support but no more CPR if she has sudden deterioration tonight.  They all fully understand that we may be approaching the decision for comfort care only.  Will reassess depending on her response to extubation attempts

## 2017-12-11 NOTE — Progress Notes (Signed)
PULMONARY / CRITICAL CARE MEDICINE   Name: [name] MRN: [id number] DOB: January 16, 1946    ADMISSION DATE:  11/13/2017 CONSULTATION DATE:  12/04/2017  REFERRING MD:  Dr. Myra Gianotti   CHIEF COMPLAINT:  Near cardiac arrest  HISTORY OF PRESENT ILLNESS:   72 year old female with a history of peripheral vascular disease, and diabetes.  Presented for occluded left superficial femoral artery, underwent stenting however prior to discharge developed acute groin hematoma, profound hypotensive, and near cardiac arrest requiring emergent intubation and exploratory surgery where she underwent evacuation of hematoma and was found to have a unsure on the femoral sheath which was repaired.  Intraoperatively she received 4 units of blood. She returned to ICU on vent. She was extubated the same day. She did require a somewhat longer duration of pressors. Course complicated by erosive esophagitis, but overall she had been improving, albeit, severely deconditioned. Then further complicated by sepsis and ileus of unknown origin. High output feculent OGT drainage. She was taken to the OR 6/10 for R femoral wound debridement and wound vac change. Postoperatively she was tachypneic on vent and lost pulses. She underwent very short ACLS course with one administration of epinephrine. She was transferred back up to ICU on pressors and vent. PCCM asked to revisit her care.   PAST MEDICAL HISTORY :  She  has a past medical history of Anemia, Anxiety, Carotid artery occlusion, CHF (congestive heart failure) (HCC), Constipation, Coronary artery disease, Diabetic neuropathy with neurologic complication (HCC), Fall from slipping on wet surface (Jan. 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  PAST SURGICAL HISTORY: She  has a past surgical history that includes Vesicovaginal fistula closure w/ TAH; Colonoscopy (11/2010); Upper gastrointestinal endoscopy (11/2010); Femoral artery - popliteal artery bypass graft; Abdominal hysterectomy; Femoral-popliteal Bypass Graft (08/22/2011); Intraoperative arteriogram (08/22/2011); Multiple tooth extractions (08-29-2011); pci (01/17/12); Breast lumpectomy; Tonsillectomy and adenoidectomy; Coronary angioplasty with stent; Carotid endarterectomy (~ 2005); Aorta - bilateral femoral artery bypass graft (03/26/2012); Umbilical hernia repair (03/26/2012); Femoral artery debridement (03/26/2012); Amputation (04/23/2012); Eye surgery (Right, Feb. 9, 2015); Eye surgery (Left, Feb. 16, 2015); abdominal aortagram (N/A, 05/17/2011); lower extremity angiogram (05/17/2011); percutaneous stent intervention (Right, 05/17/2011); lower extremity angiogram (N/A, 09/21/2011); abdominal angiogram (09/21/2011); abdominal aortagram (N/A, 01/17/2012); left heart catheterization with coronary angiogram (N/A, 04/05/2012); Cardiac catheterization (N/A, 06/08/2015); Hernia repair; Femoral-tibial Bypass Graft (Left, 08/19/2015); PERIPHERAL VASCULAR INTERVENTION (11/28/2017); Lower Extremity Angiography (N/A, 11/30/2017); PERIPHERAL VASCULAR BALLOON ANGIOPLASTY (Left, 11/23/2017); PERIPHERAL VASCULAR INTERVENTION (Left, 11/14/2017); and Femoral artery debridement (Right, Dec 22, 2017).  Allergies  Allergen Reactions  . Statins Other (See Comments)    Leg myalgias  . Acetaminophen Nausea Only and Other (See Comments)    Does not tolerate well, nausea  . Ceftriaxone Itching    Pt has tolerated  amoxicillin and cephalexin in the past.  . Codeine Nausea And Vomiting  . Erythromycin Nausea And Vomiting  . Hydrocodone-Acetaminophen Nausea And Vomiting  . Hydromorphone Nausea And Vomiting  . Ibuprofen Other (See Comments)    Unspecified  Per Pt "Does not tolerate well"  . Propoxyphene Hcl Nausea And Vomiting  . Shellfish-Derived Products Other (See Comments)    "  pt does not eat" Unspecified reaction    No current facility-administered medications on file prior to encounter.    Current Outpatient Medications on File Prior to Encounter  Medication Sig  . ALPRAZolam (XANAX) 0.25 MG tablet Take 0.25 mg by mouth daily as needed for anxiety.   Marland Kitchen aspirin EC 81 MG tablet Take 1 tablet (81 mg total) by mouth daily. (Patient taking differently: Take 162 mg by mouth daily. Takes two daily)  . calcium carbonate (TUMS - DOSED IN MG ELEMENTAL CALCIUM) 500 MG chewable tablet Chew 1,000 mg by mouth daily as needed for indigestion or heartburn.  . dexlansoprazole (DEXILANT) 60 MG capsule Take 60 mg by mouth 2 (two) times daily.  . ergocalciferol (VITAMIN D2) 50000 units capsule Take 50,000 Units by mouth 3 (three) times a week.  . furosemide (LASIX) 40 MG tablet Take 40 mg by mouth 2 (two) times daily.  . iron polysaccharides (NIFEREX) 150 MG capsule Take 600 mg by mouth daily.   . isosorbide mononitrate (IMDUR) 30 MG 24 hr tablet Take 30 mg by mouth daily.  . Lactobacillus (ACIDOPHILUS PO) Take 1 capsule by mouth daily.  . meperidine (DEMEROL) 50 MG tablet Take 50 mg by mouth every 4 (four) hours as needed for severe pain.  . metFORMIN (GLUCOPHAGE) 500 MG tablet Take 500 mg by mouth 2 (two) times daily with a meal.   . metoprolol succinate (TOPROL-XL) 50 MG 24 hr tablet Take 1 tablet (50 mg total) by mouth daily.  Marland Kitchen omega-3 acid ethyl esters (LOVAZA) 1 G capsule Take 2 g by mouth 2 (two) times daily.   . prochlorperazine (COMPAZINE) 10 MG tablet Take 10 mg by mouth every 6 (six) hours as needed  for nausea or vomiting.  . ramipril (ALTACE) 1.25 MG capsule Take 1.25 mg by mouth daily.  . rosuvastatin (CRESTOR) 10 MG tablet Take 10 mg by mouth every Monday.   . sodium chloride (OCEAN) 0.65 % SOLN nasal spray Place 2-4 sprays into both nostrils every 4 (four) hours as needed (for nose bleeds).  Marland Kitchen sulfamethoxazole-trimethoprim (BACTRIM DS,SEPTRA DS) 800-160 MG tablet Take 1 tablet by mouth 2 (two) times daily.  . TRESIBA FLEXTOUCH 200 UNIT/ML SOPN Inject 16 Units into the muscle every morning.   . nitroGLYCERIN (NITROSTAT) 0.4 MG SL tablet Place 1 tablet (0.4 mg total) under the tongue every 5 (five) minutes as needed. For chest pain    FAMILY HISTORY:  Her indicated that her mother is deceased. She indicated that her father is deceased. She indicated that both of her brothers are alive. She indicated that her maternal grandmother is deceased. She indicated that her maternal grandfather is deceased. She indicated that her paternal grandmother is deceased. She indicated that her paternal grandfather is deceased. She indicated that her daughter is alive. She indicated that her son is alive.   SOCIAL HISTORY: She  reports that she quit smoking about 16 years ago. Her smoking use included cigarettes. She has a 78.00 pack-year smoking history. She has never used smokeless tobacco. She reports that she drinks about 9.0 oz of alcohol per week. She reports that she does not use drugs.  REVIEW OF SYSTEMS:   unable  SUBJECTIVE:  On levophed at 4 mcg/min Precedex at 0.3 mcg/kg/min  VITAL SIGNS: BP (!) 93/55   Pulse (!) 106   Temp 97.7 F (36.5 C) (Oral)   Resp 20   Ht 5\' 4"  (1.626 m)   Wt 102.5 kg (226 lb)   SpO2  100%   BMI 38.79 kg/m   HEMODYNAMICS:    VENTILATOR SETTINGS: Vent Mode: PRVC FiO2 (%):  [100 %] 100 % Set Rate:  [18 bmp] 18 bmp Vt Set:  [440 mL] 440 mL PEEP:  [5 cmH20] 5 cmH20 Plateau Pressure:  [22 cmH20] 22 cmH20  INTAKE / OUTPUT: I/O last 3 completed  shifts: In: 2015.2 [P.O.:220; I.V.:548.3; NG/GT:946.8; IV Piggyback:300] Out: 500 [Urine:500]  PHYSICAL EXAMINATION: General:  Critically ill elderly female on MV in NAD HEENT: pupils 3/reactive, ETT- at 19 at lip/ OGT - with dark output, no JVD Neuro: sedated, moves upper extremities > lower, not f/c CV: SR, no murmur, faint radial pulses, +left PT by doppler PULM: even/non-labored on MV, breathing over MV/tachypneic, lungs bilaterally clear, slightly diminished in bases GI: distended, firm, no BS apprecitated, purewick foley Extremities: cool/ dry, R BKA, R groin with wound vac, minimal discoloration of fingertips, Left toes dry gangrene Skin: no rashes   LABS:  BMET Recent Labs  Lab 12/07/17 0500 12/09/17 1033 12/10/17 2255  NA 140 139 139  K 4.2 3.8 3.8  CL 109 104 101  CO2 27 26 24   BUN 35* 31* 50*  CREATININE 0.75 0.84 1.60*  GLUCOSE 117* 213* 284*    Electrolytes Recent Labs  Lab 12/05/17 0405  12/07/17 0500 12/09/17 1033 12/10/17 2255  CALCIUM 7.8*   < > 8.0* 8.0* 7.8*  MG 1.8  --  1.9  --   --   PHOS 3.1  --  4.4  --   --    < > = values in this interval not displayed.    CBC Recent Labs  Lab 12/10/17 2255 12/30/2017 0108 12/08/2017 0430  WBC 27.6* 27.1* 28.5*  HGB 10.6* 9.5* 9.8*  HCT 33.1* 30.1* 31.3*  PLT 461* 409* 381    Coag's No results for input(s): APTT, INR in the last 168 hours.  Sepsis Markers Recent Labs  Lab 12/10/17 2254  LATICACIDVEN 1.8    ABG Recent Labs  Lab 12/12/2017 1808  PHART 7.335*  PCO2ART 43.8  PO2ART 188.0*    Liver Enzymes Recent Labs  Lab 12/06/17 1425 12/07/17 0500 12/09/17 1033  AST 27 28 39  ALT 23 24 33  ALKPHOS 93 95 155*  BILITOT 0.5 0.5 0.8  ALBUMIN 1.1* 1.1* 1.3*    Cardiac Enzymes No results for input(s): TROPONINI, PROBNP in the last 168 hours.  Glucose Recent Labs  Lab 12/10/17 2244 12/21/2017 0007 12/22/2017 0354 12/25/2017 0808 12/31/2017 1155 12/28/2017 1740  GLUCAP 267* 288* 246*  247* 204* 260*    Imaging Dg Chest Port 1 View  Result Date: 12/10/2017 CLINICAL DATA:  Tachypnea EXAM: PORTABLE CHEST 1 VIEW COMPARISON:  1048 hours on the same day FINDINGS: Small left effusion with bibasilar atelectasis is redemonstrated. Borderline cardiomegaly with moderate aortic atherosclerosis. Gastric tube is again visualized with tip excluded on this exam. Right-sided PICC line catheter is noted with tip at the cavoatrial junction. IMPRESSION: Redemonstration of bibasilar atelectasis and small left effusion. Borderline cardiomegaly with moderate aortic atherosclerosis. No significant change in the appearance of the chest. Electronically Signed   By: Tollie Eth M.D.   On: 12/10/2017 23:50   Dg Abd Portable 1v  Result Date: 12/10/2017 CLINICAL DATA:  Abdominal distention EXAM: PORTABLE ABDOMEN - 1 VIEW COMPARISON:  Prior exams dating back through 11/27/2017 FINDINGS: Enteric feeding tube is seen coiled in the expected location the stomach. Progression of small bowel dilatation since recent comparison compatible with evolving small-bowel obstruction  or ileus. Increased fecal residue is seen within overlying large bowel. Vascular stents are seen along the common iliac arteries bilaterally and right external iliac. IMPRESSION: Interval worsening of small bowel dilatation compatible with small bowel obstruction with ileus among differential possibilities, favor partial or early small bowel obstruction. Electronically Signed   By: Tollie Eth M.D.   On: 12/10/2017 23:55   STUDIES:  6/9 AXR << Interval worsening of small bowel dilatation compatible with small bowel obstruction with ileus among differential possibilities, favor partial or early small bowel obstruction  CULTURES: 6/10 R fem/wound cx >> 6/10 bc >> 6/10 trach asp >> 6/10 UC >>  ANTIBIOTICS: 6/9 zosyn >>  6/9 vanc >>  SIGNIFICANT EVENTS: 5/21 Admitted   LINES/TUBES: 5/22 L IJ CVC >>6/4 RUE DL PICC 6/4  >>  DISCUSSION: 72 yo female vasculopath with prolonged hospitalization   ASSESSMENT / PLAN:  PULMONARY A: Acute respiratory insufficiency R/o aspiration PNA  P:   Full MV support, PRVC 8 cc/kg CXR now- ETT advance to 21 cm at lip CXR in am/ trend ABG SBT in am VAP protocol   CARDIOVASCULAR A:  Shock: septic +/- hemorrhagic, brief cardiac arrest in OR 6/10 S/p L femoral artery stent, R CFA PAD P:  Telemetry monitoring Wean levophed for goal MAP > 65 Consider albumin if needed for volume Check H/H, rule out ABLA See ID  Hold lasix  Per Dr. Arbie Cookey, no CPR or defib, escalation of care ok for now, plans to re-address GOC 6/11  RENAL A:   AKI P:   Insert foley now Trend BMP /mag/ phos/ daily wt/ urinary output Replace electrolytes as indicated  GASTROINTESTINAL A:   Ileus r/o SBO NPO P:   OGT to wall suction PPI q 12 AXR now, may need surgical consult, however will depend on GOC Goal K > 4, Mg > 2  HEMATOLOGIC A:   Acute blood loss anemia P:  H/H now Trend CBC Anticoagulation per vascular  INFECTIOUS A:   R groin infection Possible aspiration of gastric contents in OR 6/10 P:   panculture now Continue vanc and zosyn Trend WBC and fever curve   ENDOCRINE A:   Hyperglycemia with no history of DM  P:   CBG monitoring and SSI  NEUROLOGIC A:   Acute encephalopathy - ddx likely r/t to sedation, MAE, toxic/metabolic P:   RASS goal: -1/-2 Continue precedex and prn fentanyl, minimize as able Ativan prn  WUA in am   FAMILY  - Updates: no family present at bedside.   - Inter-disciplinary family meet or Palliative Care meeting due by:  Ongoing, plans to re-assess GOC 6/11  CCT 45 mins  Posey Boyer, AGACNP-BC [facility] Pgr: [phone] or if no answer [phone] 2018-01-04, 8:10 PM

## 2017-12-11 NOTE — Progress Notes (Signed)
I received a phone call just now from Dr Early who says he had a conversation earlier today with patient's husband regarding her goals of care. Dr Arbie Cookey says the patient's husband does not want CPR or Defibrillation but is agreeable to other escalation of care as needed. They plan to re-address goals of care tomorrow and consider withdrawing care if she is not making improvements at that time. Dr Arbie Cookey is not currently in the hospital and has asked me to place a partial code order on his behalf.

## 2017-12-11 NOTE — Progress Notes (Signed)
eLink Physician-Brief Progress Note Patient Name: Angelese Pietila DOB: March 23, 1946 MRN: [id number]   Date of Service  12/08/2017  HPI/Events of Note  Multiple issues: 1. Lactic Acid = 3.1 and 2. Oliguria. CVP not done yet. Last LVEF = 55% to 60%.  eICU Interventions  Will order: 1. Bolus with 0.9 NaCl 1 liter IV over 1 hour now.      Intervention Category Major Interventions: Acid-Base disturbance - evaluation and management Intermediate Interventions: Oliguria - evaluation and management  Rondia Higginbotham [place] 12/25/2017, 10:47 PM

## 2017-12-11 NOTE — H&P (View-Only) (Signed)
  Progress Note    12/22/2017 9:10 AM 19 Days Post-Op  Subjective:  Less alert this morning; endorses abd pain   Vitals:   12/08/2017 0500 12/31/2017 0600  BP:    Pulse: (!) 115 (!) 102  Resp: (!) 28 (!) 37  Temp:    SpO2: 99% 99%   Physical Exam: Cardiac:  Tachycardia Incisions:  R groin incision with necrotic tissue around incision with serous and cloudy discharge Extremities:  Distal L foot cool to touch; venous like peroneal signal  Abdomen:  Mildly tense to palpation; tender with deep palpation Neurologic: less alert but still able to communicate verbally  CBC    Component Value Date/Time   WBC 28.5 (H) 12/15/2017 0430   RBC 3.53 (L) 12/12/2017 0430   HGB 9.8 (L) 12/25/2017 0430   HCT 31.3 (L) 12/06/2017 0430   HCT 27.2 (L) 12/14/2015 0445   PLT 381 12/29/2017 0430   MCV 88.7 12/24/2017 0430   MCH 27.8 12/27/2017 0430   MCHC 31.3 12/27/2017 0430   RDW 19.0 (H) 12/20/2017 0430   LYMPHSABS 0.7 12/08/2017 0747   MONOABS 0.9 12/08/2017 0747   EOSABS 0.1 12/08/2017 0747   BASOSABS 0.1 12/08/2017 0747    BMET    Component Value Date/Time   NA 139 12/10/2017 2255   K 3.8 12/10/2017 2255   CL 101 12/10/2017 2255   CO2 24 12/10/2017 2255   GLUCOSE 284 (H) 12/10/2017 2255   BUN 50 (H) 12/10/2017 2255   CREATININE 1.60 (H) 12/10/2017 2255   CALCIUM 7.8 (L) 12/10/2017 2255   GFRNONAA 31 (L) 12/10/2017 2255   GFRAA 36 (L) 12/10/2017 2255    INR    Component Value Date/Time   INR 1.60 (H) 12/11/2015 0930     Intake/Output Summary (Last 24 hours) at 12/02/2017 0910 Last data filed at 12/20/2017 0600 Gross per 24 hour  Intake 1241.33 ml  Output -  Net 1241.33 ml     Assessment/Plan:  71 y.o. female is s/p repair R CFA with increasing drainage  19 Days Post-Op   - 200cc dark fluid out of NG since transfer to ICU; abd still somewhat tight, continue NG suction and NPO - WBC increased/tachycardic - continue vanc/zosyn dosed by pharmacy; unsure of etiology,  differential includes aspiration pneumonia and ABF bypass infection - Dr. Dawnette Mione planning on incision and drainage R groin today in OR - Continue doppler checks LLE; Dry gangrene toes L foot stable   Matthew Eveland, PA-C [facility] [phone] 12/26/2017 9:10 AM   I have examined the patient, reviewed and agree with above.  Very difficult situation with progressive overall clinical deterioration.  Cannot tolerate tube feeds.  KUB yesterday showed ileus versus small bowel obstruction.  We will keep NG tube to suction.  Has fat necrosis drainage from right groin with 3 cm area of obvious skin necrosis.  White blood cell count increased.  Had a long discussion with the patient's husband this morning.  Explained that her overall weakened status makes it very difficult for recovery.  Also explained that if she does have involvement of her aortofemoral bypass grafting that the definitive treatment would be removal of the graft which she obviously could not survive.  Will take to the operating room this afternoon for debridement and probable VAC placement.  Understands her critical condition and also discussed with the husband we may need to begin discussion of comfort care.  Estanislao Harmon, MD 12/23/2017 10:09 AM  

## 2017-12-11 NOTE — Progress Notes (Addendum)
Nutrition Follow-up  DOCUMENTATION CODES:   Obesity unspecified  INTERVENTION:  RD to monitor pt status post-op  NUTRITION DIAGNOSIS:   Increased nutrient needs related to chronic illness, wound healing as evidenced by estimated needs.  Ongoing  GOAL:   Patient will meet greater than or equal to 90% of their needs  Ongoing - not met; not tolerating TF  MONITOR:   PO intake, Supplement acceptance, Labs, Weight trends, Skin, I & O's  REASON FOR ASSESSMENT:   Consult New TPN/TNA  ASSESSMENT:   72 yo Female who underwent left superficial femoral and profundofemoral arterial angiography, angioplasty and then stent placeme.nt 5/21.  Course complicated by pseudoaneurysm, acute hemorrhage and associated hemorrhagic shock.  Pt not tolerating TF N/V, dark oral output, and abdominal distention.  RN reports that pt has dark output from mouth and NG tube. RN reports that even if a tube is place post-pyloric, they won't be starting feeds today due to pt status and probable OR visit soon. Per RN pt may also have ileus of unknown location. Per MD note pt with groin infection presenting for groin debridement 6/10.  Pt awake in bed, lethargic and minimally responsive to questions.   Medications reviewed: colace, marinol, EE TID, lasix, novolog 0-15 units, lopressor, protonix, 0.9% NaCl 100 ml/hr, glucerna 1.2 @ 30 ml/hr, lactated ringers infusion, zosyn, vancocin.  Labs reviewed: BG 284 (H), BUN 50 (H), creatinine 1.6 (H), GFR 31 (L), WBC 27.6 (H), RBC 3.8 (L), hemoglobin 10.6 (L), HCT 33.1 (L), platelets 409 (H).   Diet Order:   Diet Order           Diet NPO time specified Except for: Other (See Comments)  Diet effective midnight          EDUCATION NEEDS:   No education needs have been identified at this time  Skin:  Skin Assessment: Skin Integrity Issues: Skin Integrity Issues:: DTI DTI: sacrum  Last BM:  12/10/17  Height:   Ht Readings from Last 1 Encounters:  12/06/17  5' 4"  (1.626 m)    Weight:   Wt Readings from Last 1 Encounters:  12/10/2017 226 lb (102.5 kg)    Ideal Body Weight:  54.5 kg  BMI:  Body mass index is 38.79 kg/m.  Estimated Nutritional Needs:   Kcal:  1800-2000  Protein:  90-105 gm  Fluid:  1.8-2.0 L    Hope Budds, Dietetic Intern

## 2017-12-11 NOTE — Transfer of Care (Signed)
Immediate Anesthesia Transfer of Care Note  Patient: [name]  Procedure(s) Performed: Drucie Ip DEBRIDEMENT (Right )  Patient Location: SICU  Anesthesia Type:General  Level of Consciousness: Patient remains intubated per anesthesia plan  Airway & Oxygen Therapy: Patient remains intubated per anesthesia plan and Patient placed on Ventilator (see vital sign flow sheet for setting)  Post-op Assessment: Report given to RN and Post -op Vital signs reviewed and stable  Post vital signs: Reviewed and stable  Last Vitals:  Vitals Value Taken Time  BP 162/53 12/05/2017  5:04 PM  Temp    Pulse 100 12/31/2017  5:11 PM  Resp 18 12/31/2017  5:11 PM  SpO2 100 % 12/04/2017  5:11 PM  Vitals shown include unvalidated device data.  Last Pain:  Vitals:   12/02/2017 1000  TempSrc:   PainSc: 0-No pain      Patients Stated Pain Goal: 0 (11/29/17 0800)  Complications: No apparent anesthesia complications.

## 2017-12-11 NOTE — Anesthesia Procedure Notes (Signed)
Procedure Name: Intubation Date/Time: 12/26/2017 3:00 PM Performed by: Leonor Liv, CRNA Pre-anesthesia Checklist: Patient identified, Emergency Drugs available, Suction available and Patient being monitored Patient Re-evaluated:Patient Re-evaluated prior to induction Oxygen Delivery Method: Circle System Utilized Preoxygenation: Pre-oxygenation with 100% oxygen Induction Type: IV induction, Rapid sequence and Cricoid Pressure applied Laryngoscope Size: Mac and 3 Grade View: Grade I Tube type: Subglottic suction tube Tube size: 7.5 mm Number of attempts: 1 Airway Equipment and Method: Stylet Placement Confirmation: ETT inserted through vocal cords under direct vision,  positive ETCO2 and breath sounds checked- equal and bilateral Secured at: 21 cm Tube secured with: Tape Dental Injury: Teeth and Oropharynx as per pre-operative assessment

## 2017-12-11 NOTE — Interval H&P Note (Signed)
History and Physical Interval Note:  12/25/2017 2:20 PM  [name]  has presented today for surgery, with the diagnosis of groin infection  The various methods of treatment have been discussed with the patient and family. After consideration of risks, benefits and other options for treatment, the patient has consented to  Procedure(s): GROIN DEBRIDEMENT (Right) as a surgical intervention .  The patient's history has been reviewed, patient examined, no change in status, stable for surgery.  I have reviewed the patient's chart and labs.  Questions were answered to the patient's satisfaction.     Gretta Began

## 2017-12-12 ENCOUNTER — Inpatient Hospital Stay (HOSPITAL_COMMUNITY): Payer: Medicare Other

## 2017-12-12 LAB — CBC
HEMATOCRIT: 22.3 % — AB (ref 36.0–46.0)
HEMATOCRIT: 22.8 % — AB (ref 36.0–46.0)
HEMOGLOBIN: 7 g/dL — AB (ref 12.0–15.0)
Hemoglobin: 7 g/dL — ABNORMAL LOW (ref 12.0–15.0)
MCH: 28.1 pg (ref 26.0–34.0)
MCH: 27.7 pg (ref 26.0–34.0)
MCHC: 31.4 g/dL (ref 30.0–36.0)
MCHC: 30.7 g/dL (ref 30.0–36.0)
MCV: 89.6 fL (ref 78.0–100.0)
MCV: 90.1 fL (ref 78.0–100.0)
PLATELETS: 246 10*3/uL (ref 150–400)
Platelets: 252 10*3/uL (ref 150–400)
RBC: 2.49 MIL/uL — ABNORMAL LOW (ref 3.87–5.11)
RBC: 2.53 MIL/uL — ABNORMAL LOW (ref 3.87–5.11)
RDW: 19.4 % — AB (ref 11.5–15.5)
RDW: 19.5 % — ABNORMAL HIGH (ref 11.5–15.5)
WBC: 27.2 10*3/uL — AB (ref 4.0–10.5)
WBC: 28 10*3/uL — AB (ref 4.0–10.5)

## 2017-12-12 LAB — BASIC METABOLIC PANEL
ANION GAP: 9 (ref 5–15)
ANION GAP: 10 (ref 5–15)
BUN: 56 mg/dL — ABNORMAL HIGH (ref 6–20)
BUN: 70 mg/dL — ABNORMAL HIGH (ref 6–20)
CALCIUM: 5.3 mg/dL — AB (ref 8.9–10.3)
CHLORIDE: 107 mmol/L (ref 101–111)
CO2: 18 mmol/L — AB (ref 22–32)
CO2: 19 mmol/L — ABNORMAL LOW (ref 22–32)
CREATININE: 1.73 mg/dL — AB (ref 0.44–1.00)
Calcium: 6.4 mg/dL — CL (ref 8.9–10.3)
Chloride: 112 mmol/L — ABNORMAL HIGH (ref 101–111)
Creatinine, Ser: 2.33 mg/dL — ABNORMAL HIGH (ref 0.44–1.00)
GFR calc non Af Amer: 20 mL/min — ABNORMAL LOW (ref >60)
GFR, EST AFRICAN AMERICAN: 33 mL/min — AB (ref >60)
GFR, EST AFRICAN AMERICAN: 23 mL/min — AB (ref >60)
GFR, EST NON AFRICAN AMERICAN: 28 mL/min — AB (ref >60)
Glucose, Bld: 274 mg/dL — ABNORMAL HIGH (ref 65–99)
Glucose, Bld: 262 mg/dL — ABNORMAL HIGH (ref 65–99)
POTASSIUM: 4.8 mmol/L (ref 3.5–5.1)
Potassium: 3.4 mmol/L — ABNORMAL LOW (ref 3.5–5.1)
SODIUM: 139 mmol/L (ref 135–145)
SODIUM: 136 mmol/L (ref 135–145)

## 2017-12-12 LAB — MRSA PCR SCREENING: MRSA BY PCR: POSITIVE — AB

## 2017-12-12 LAB — GLUCOSE, CAPILLARY
GLUCOSE-CAPILLARY: 158 mg/dL — AB (ref 65–99)
Glucose-Capillary: 83 mg/dL (ref 65–99)
Glucose-Capillary: 144 mg/dL — ABNORMAL HIGH (ref 65–99)
Glucose-Capillary: 185 mg/dL — ABNORMAL HIGH (ref 65–99)
Glucose-Capillary: 169 mg/dL — ABNORMAL HIGH (ref 65–99)

## 2017-12-12 LAB — PHOSPHORUS: PHOSPHORUS: 5.3 mg/dL — AB (ref 2.5–4.6)

## 2017-12-12 LAB — LACTIC ACID, PLASMA
Lactic Acid, Venous: 0.9 mmol/L (ref 0.5–1.9)
Lactic Acid, Venous: 1.1 mmol/L (ref 0.5–1.9)

## 2017-12-12 LAB — MAGNESIUM
MAGNESIUM: 2.4 mg/dL (ref 1.7–2.4)
Magnesium: 1.4 mg/dL — ABNORMAL LOW (ref 1.7–2.4)

## 2017-12-12 LAB — PROCALCITONIN: PROCALCITONIN: 9.38 ng/mL

## 2017-12-12 MED ORDER — MIDAZOLAM HCL 2 MG/2ML IJ SOLN: 2.0000 mg | INTRAMUSCULAR | Status: DC | PRN

## 2017-12-12 MED ORDER — FENTANYL CITRATE (PF) 100 MCG/2ML IJ SOLN: 50.0000 ug | Freq: Once | INTRAMUSCULAR | Status: AC

## 2017-12-12 MED ORDER — FENTANYL 2500MCG IN NS 250ML (10MCG/ML) PREMIX INFUSION
25.0000 ug/h | INTRAVENOUS | Status: DC
  Administered 2017-12-12 (×2): 150 ug/h via INTRAVENOUS
  Administered 2017-12-13: 400 ug/h via INTRAVENOUS
  Filled 2017-12-12 (×3): qty 250

## 2017-12-12 MED ORDER — FENTANYL BOLUS VIA INFUSION
50.0000 ug | INTRAVENOUS | Status: DC | PRN
  Administered 2017-12-13: 50 ug via INTRAVENOUS
  Filled 2017-12-12: qty 50

## 2017-12-12 MED ORDER — POTASSIUM CHLORIDE 10 MEQ/50ML IV SOLN
10.0000 meq | INTRAVENOUS | Status: AC
  Administered 2017-12-12 (×3): 10 meq via INTRAVENOUS
  Filled 2017-12-12 (×2): qty 50

## 2017-12-12 MED ORDER — MAGNESIUM SULFATE 2 GM/50ML IV SOLN
2.0000 g | Freq: Once | INTRAVENOUS | Status: AC
  Administered 2017-12-12: 2 g via INTRAVENOUS
  Filled 2017-12-12: qty 50

## 2017-12-12 MED ORDER — SODIUM CHLORIDE 0.9 % IV SOLN
1.0000 g | Freq: Once | INTRAVENOUS | Status: AC
  Administered 2017-12-12: 1 g via INTRAVENOUS
  Filled 2017-12-12: qty 10

## 2017-12-12 MED ORDER — MUPIROCIN 2 % EX OINT
1.0000 "application " | TOPICAL_OINTMENT | Freq: Two times a day (BID) | CUTANEOUS | Status: DC
  Administered 2017-12-12 – 2017-12-13 (×3): 1 via NASAL
  Filled 2017-12-12: qty 22

## 2017-12-12 MED ORDER — SODIUM CHLORIDE 0.9 % IV SOLN
Freq: Once | INTRAVENOUS | Status: AC
  Administered 2017-12-13: 05:00:00 via INTRAVENOUS

## 2017-12-12 MED ORDER — SODIUM CHLORIDE 0.9 % IV SOLN
2.0000 g | Freq: Once | INTRAVENOUS | Status: AC
  Administered 2017-12-12: 2 g via INTRAVENOUS
  Filled 2017-12-12: qty 20

## 2017-12-12 MED ORDER — SODIUM CHLORIDE 0.9 % IV BOLUS
500.0000 mL | Freq: Once | INTRAVENOUS | Status: AC
  Administered 2017-12-12: 500 mL via INTRAVENOUS

## 2017-12-12 NOTE — Progress Notes (Signed)
CRITICAL VALUE ALERT  Critical Value: Lactic acid 3.1  Date & Time Notied:  12/14/2017 2245  Provider Notified: Dr.Sommer  Orders Received/Actions taken: 1 liter bolus 0.9% NS

## 2017-12-12 NOTE — Progress Notes (Signed)
PULMONARY / CRITICAL CARE MEDICINE   Name: [name] MRN: [id number] DOB: Jan 30, 1946    ADMISSION DATE:  11/10/2017 CONSULTATION DATE:  12/14/2017  REFERRING MD:  Dr. Myra Gianotti   CHIEF COMPLAINT:  Near cardiac arrest  HISTORY OF PRESENT ILLNESS:   72 year old female with a history of peripheral vascular disease, and diabetes.  Presented for occluded left superficial femoral artery, underwent stenting however prior to discharge developed acute groin hematoma, profound hypotensive, and near cardiac arrest requiring emergent intubation and exploratory surgery where she underwent evacuation of hematoma and was found to have a unsure on the femoral sheath which was repaired.  Intraoperatively she received 4 units of blood. She returned to ICU on vent. She was extubated the same day. She did require a somewhat longer duration of pressors. Course complicated by erosive esophagitis, but overall she had been improving, albeit, severely deconditioned. Then further complicated by sepsis and ileus of unknown origin. High output feculent OGT drainage. She was taken to the OR 6/10 for R femoral wound debridement and wound vac change. Postoperatively she was tachypneic on vent and lost pulses. She underwent very short ACLS course with one administration of epinephrine. She was transferred back up to ICU on pressors and vent. PCCM asked to revisit her care.     SUBJECTIVE:  No escalation of care per family and Dr. Arbie Cookey.  If no improvement within 24 hours most likely will go to terminal extubation.  VITAL SIGNS: BP (!) 129/50   Pulse (!) 103   Temp 98.4 F (36.9 C) (Oral)   Resp (!) 41   Ht 5\' 4"  (1.626 m)   Wt 107 kg (235 lb 14.3 oz)   SpO2 97%   BMI 40.49 kg/m   HEMODYNAMICS: CVP:  [9 mmHg-14 mmHg] 9 mmHg  VENTILATOR SETTINGS: Vent Mode: PRVC FiO2 (%):  [40 %-100 %] 40 % Set Rate:  [18 bmp-36 bmp] 36 bmp Vt Set:  [440 mL] 440 mL PEEP:  [5 cmH20] 5 cmH20 Plateau Pressure:  [18 cmH20-22  cmH20] 20 cmH20  INTAKE / OUTPUT: I/O last 3 completed shifts: In: 4419.3 [I.V.:3308.3; NG/GT:211; IV Piggyback:900] Out: 2160 [Urine:1060; Emesis/NG output:1000; Drains:25; Other:75]  PHYSICAL EXAMINATION: General: Frail elderly female sedated on vent HEENT: Endotracheal tube to vent Neuro: Sedated CV: Heart sounds distant PULM: even/non-labored, lungs bilaterally creased IO:NGEX, non-tender, bsx4 active  Extremities: Right AKA stump left leg edematous warm Skin: no rashes or lesions   LABS:  BMET Recent Labs  Lab 12/09/17 1033 12/10/17 2255 12/12/17 0500  NA 139 139 139  K 3.8 3.8 3.4*  CL 104 101 112*  CO2 26 24 18*  BUN 31* 50* 56*  CREATININE 0.84 1.60* 1.73*  GLUCOSE 213* 284* 274*    Electrolytes Recent Labs  Lab 12/07/17 0500 12/09/17 1033 12/10/17 2255 12/12/17 0500  CALCIUM 8.0* 8.0* 7.8* 5.3*  MG 1.9  --   --  1.4*  PHOS 4.4  --   --  5.3*    CBC Recent Labs  Lab 12/30/2017 0108 12/28/2017 0430 12/12/2017 2052 12/12/17 0500  WBC 27.1* 28.5*  --  27.2*  HGB 9.5* 9.8* 8.1* 7.0*  HCT 30.1* 31.3* 26.1* 22.3*  PLT 409* 381  --  246    Coag's No results for input(s): APTT, INR in the last 168 hours.  Sepsis Markers Recent Labs  Lab 12/02/2017 2052 12/12/17 0012 12/12/17 0244 12/12/17 0500  LATICACIDVEN 3.1* 1.1 0.9  --   PROCALCITON 7.88  --   --  9.38  ABG Recent Labs  Lab 12/02/2017 1808  PHART 7.335*  PCO2ART 43.8  PO2ART 188.0*    Liver Enzymes Recent Labs  Lab 12/06/17 1425 12/07/17 0500 12/09/17 1033  AST 27 28 39  ALT 23 24 33  ALKPHOS 93 95 155*  BILITOT 0.5 0.5 0.8  ALBUMIN 1.1* 1.1* 1.3*    Cardiac Enzymes No results for input(s): TROPONINI, PROBNP in the last 168 hours.  Glucose Recent Labs  Lab 12/31/2017 1155 12/23/2017 1740 12/17/2017 1944 12/14/2017 2337 12/12/17 0352 12/12/17 0839  GLUCAP 204* 260* 149* 87 83 144*    Imaging Dg Abd 1 View  Result Date: 12/26/2017 CLINICAL DATA:  Ileus EXAM: ABDOMEN  - 1 VIEW COMPARISON:  None. FINDINGS: Increased colonic stool burden redemonstrated. Gastric tube is seen coiled in the expected location of the stomach. Interval slight decompression of dilated small bowel loops. No radio-opaque calculi. Redemonstration of by common iliac arterial stents. IMPRESSION: Slight interval decompression of dilated small bowel loops. Significant stool burden persists. Electronically Signed   By: Tollie Eth M.D.   On: 12/02/2017 20:26   Dg Chest Port 1 View  Result Date: 12/12/2017 CLINICAL DATA:  Airway aspiration.  Ileus. EXAM: PORTABLE CHEST 1 VIEW COMPARISON:  12/15/2017 FINDINGS: Endotracheal tube in good position. Right arm PICC tip in the lower SVC unchanged. Feeding tube and NG tube enters the stomach. Left lower lobe airspace disease and small left effusion unchanged. Minimal right lower lobe atelectasis unchanged. Negative for heart failure or edema. IMPRESSION: Endotracheal tube and support lines remain in good position Left lower lobe consolidation unchanged. Electronically Signed   By: Marlan Palau M.D.   On: 12/12/2017 09:11   Dg Chest Port 1 View  Result Date: 12/21/2017 CLINICAL DATA:  Endotracheal tube position EXAM: PORTABLE CHEST 1 VIEW COMPARISON:  Chest radiograph 12/10/2017 FINDINGS: Endotracheal tube tip is just below the level of the clavicles, well above the inferior margin of the carina. There is a right-sided PICC line with tip in the lower SVC. Enteric tube courses beneath the diaphragm. There is a left pleural effusion with associated left basilar atelectasis. IMPRESSION: Endotracheal tube tip just below the level of the clavicles, well above the carina. Electronically Signed   By: Deatra Robinson M.D.   On: 12/31/2017 19:58   Dg Abd Portable 1v  Result Date: 12/12/2017 CLINICAL DATA:  Ileus. EXAM: PORTABLE ABDOMEN - 1 VIEW COMPARISON:  12/10/2017 FINDINGS: Distended colon with large amount of fecal material is unchanged from the prior study. Small  bowel nondilated Atherosclerotic aorta with bilateral iliac artery stents Feeding tube in the stomach.  NG tube in the stomach unchanged. IMPRESSION: Colonic ileus with large amount of fecal material unchanged from the prior study. Electronically Signed   By: Marlan Palau M.D.   On: 12/12/2017 09:09   STUDIES:  6/9 AXR << Interval worsening of small bowel dilatation compatible with small bowel obstruction with ileus among differential possibilities, favor partial or early small bowel obstruction  CULTURES: 6/10 R fem/wound cx >> 6/10 bc >> 6/10 trach asp >> 6/10 UC >>  ANTIBIOTICS: 6/9 zosyn >>  6/9 vanc >>  SIGNIFICANT EVENTS: 5/21 Admitted  12/12/2017 continue current level care without escalation.  Increase sedation for comfort.  Like within the next 24 hours to be full comfort care and DNR  LINES/TUBES: 5/22 L IJ CVC >>6/4 RUE DL PICC 6/4 >>  DISCUSSION: 72 year old female vasculopath with prolonged hospitalization critical care is being transitioned to comfort over the next 24 hours.  ASSESSMENT / PLAN:  PULMONARY A: Acute respiratory insufficiency R/o aspiration PNA  P:   Continue full vent support for now   CARDIOVASCULAR A:  Shock: septic +/- hemorrhagic, brief cardiac arrest in OR 6/10 S/p L femoral artery stent, R CFA PAD P:  Telemetry monitoring Currently on vasopressor support Care is being transitioned to comfort Continue pressure support for now Increase in sedation  RENAL Lab Results  Component Value Date   CREATININE 1.73 (H) 12/12/2017   CREATININE 1.60 (H) 12/10/2017   CREATININE 0.84 12/09/2017   Recent Labs  Lab 12/09/17 1033 12/10/17 2255 12/12/17 0500  K 3.8 3.8 3.4*    A:   Worsening AKI P:   Trend creatinine Avoid nephrotoxic  GASTROINTESTINAL A:   Ileus r/o SBO NPO P:   Gastric tube to wall suction PPI  HEMATOLOGIC Recent Labs    2018/01/02 2052 12/12/17 0500  HGB 8.1* 7.0*   A:   Acute blood loss  anemia P:  Follow H&H Any anticoagulation per vascular service  INFECTIOUS A:   R groin infection Possible aspiration of gastric contents in OR 6/10 P:   Follow culture data Continue antibiotics for now  ENDOCRINE CBG (last 3)  Recent Labs    2018/01/02 2337 12/12/17 0352 12/12/17 0839  GLUCAP 87 83 144*     A:   Hyperglycemia with no history of DM  P:   Sliding scale insulin protocol  NEUROLOGIC A:   Acute encephalopathy - ddx likely r/t to sedation, MAE, toxic/metabolic P:   RA SS score is -1 We will add Versed since she has history of benzodiazepine use to make sure she is comfortable If needed we will transition her from Precedex to propofol. She may be on a morphine drip for the next 24 hours.  FAMILY  - Updates: 12/12/2017 has been updated at the bedside.  He is requesting comfort care she is been transition to a partial no code.  Per husband and Dr. Arbie Cookey we will continue current level of care for 24 hours if no improvement most likely will graduate to terminal wean.  - Inter-disciplinary family meet or Palliative Care meeting due by: Currently partial code most likely will transition to a DO NOT RESUSCITATE within 24 hours.   App cct 40 min  Brett Canales Minor ACNP [facility] Pollack PCCM Pager [phone] till 1 pm If no answer page [phone] 12/12/2017, 9:27 AM

## 2017-12-12 NOTE — Progress Notes (Signed)
CRITICAL VALUE ALERT  Critical Value:  Calcium 6.4 Date & Time Notied:  12/12/17 2148 Provider Notified: eLink RN for Dr Arsenio Loader Orders Received/Actions taken: awaiting orders

## 2017-12-12 NOTE — Progress Notes (Signed)
Patient ID: [name], female   DOB: 31-Jul-1945, 72 y.o.   MRN: [id number] Remains intubated.  On low-dose levo fed. Does become agitated at time but otherwise minimal response. Abdomen continued moderate distention. No change in hand hands and left foot ischemia. VAC dressing in place and functioning.  Appreciate critical care medicine's assistance regarding potential vent weaning I had a long discussion with the patient's husband and son present this morning.  They understand the critical illness with Ms. [name].  I have suggested continued level of care for today and see how she does with potentially weaning from the vent.  If she deteriorates we will not increase her level of care.  We will also consider terminal wean tomorrow if she is showing no evidence of improvement.

## 2017-12-12 NOTE — Progress Notes (Signed)
SBT attempted. Patients RR increased to 50. SBT terminated at that time.

## 2017-12-12 NOTE — Progress Notes (Signed)
eLink Physician-Brief Progress Note Patient Name: [name] DOB: Apr 19, 1946 MRN: [id number]   Date of Service  12/12/2017  HPI/Events of Note  Hypocalcemia - Ca++ = 6.4.  eICU Interventions  Will replace Ca++.      Intervention Category Major Interventions: Electrolyte abnormality - evaluation and management  Delphina Schum Eugene 12/12/2017, 10:13 PM

## 2017-12-12 NOTE — Progress Notes (Signed)
CRITICAL VALUE ALERT  Critical Value:  Calcium 5.3  Date & Time Notied:  0555 12/12/17 Provider Notified: RN eLink for Dr Arsenio Loader  Orders Received/Actions taken: eLing called awaiting orders.

## 2017-12-12 NOTE — Progress Notes (Signed)
eLink Physician-Brief Progress Note Patient Name: [name] DOB: 1946-02-28 MRN: [id number]   Date of Service  12/12/2017  HPI/Events of Note  Oliguria - CVP = 13.  eICU Interventions  Will bolus with 0.9 NaCl 500 mL IV over 30 minutes now.     Intervention Category Intermediate Interventions: Oliguria - evaluation and management  Sommer,Steven Eugene 12/12/2017, 1:23 AM

## 2017-12-12 NOTE — Plan of Care (Signed)
VS have remained stable on Levophed.

## 2017-12-12 NOTE — Progress Notes (Signed)
SLP Cancellation Note  Patient Details Name: [name] MRN: [id number] DOB: 09/14/1945   Cancelled treatment:       Reason Eval/Treat Not Completed: Medical issues which prohibited therapy (on vent)   Maxcine Ham 12/12/2017, 8:11 AM  Maxcine Ham, M.A. CCC-SLP [phone]

## 2017-12-12 NOTE — Progress Notes (Signed)
eLink Physician-Brief Progress Note Patient Name: [name] DOB: February 10, 1946 MRN: [id number]   Date of Service  12/12/2017  HPI/Events of Note  K+ = 3.4, Ca++ = 5.3, Mg++ = 1.4 and Creatinine = 1.73.  eICU Interventions  Will replace K+, Mg++ and Ca++.     Intervention Category Major Interventions: Electrolyte abnormality - evaluation and management  Sommer,Steven Eugene 12/12/2017, 6:33 AM

## 2017-12-13 LAB — GLUCOSE, CAPILLARY
GLUCOSE-CAPILLARY: 127 mg/dL — AB (ref 65–99)
Glucose-Capillary: 123 mg/dL — ABNORMAL HIGH (ref 65–99)
Glucose-Capillary: 105 mg/dL — ABNORMAL HIGH (ref 65–99)

## 2017-12-13 LAB — BASIC METABOLIC PANEL
ANION GAP: 16 — AB (ref 5–15)
BUN: 70 mg/dL — ABNORMAL HIGH (ref 6–20)
CHLORIDE: 105 mmol/L (ref 101–111)
CO2: 16 mmol/L — ABNORMAL LOW (ref 22–32)
Calcium: 6.4 mg/dL — CL (ref 8.9–10.3)
Creatinine, Ser: 2.35 mg/dL — ABNORMAL HIGH (ref 0.44–1.00)
GFR calc non Af Amer: 20 mL/min — ABNORMAL LOW (ref >60)
GFR, EST AFRICAN AMERICAN: 23 mL/min — AB (ref >60)
Glucose, Bld: 327 mg/dL — ABNORMAL HIGH (ref 65–99)
Potassium: 4.5 mmol/L (ref 3.5–5.1)
Sodium: 137 mmol/L (ref 135–145)

## 2017-12-13 LAB — MAGNESIUM: MAGNESIUM: 2.2 mg/dL (ref 1.7–2.4)

## 2017-12-13 LAB — TROPONIN I
Troponin I: 0.28 ng/mL (ref <0.03)
Troponin I: 0.27 ng/mL (ref <0.03)

## 2017-12-13 LAB — PREPARE RBC (CROSSMATCH)

## 2017-12-13 LAB — GLUCOSE, RANDOM: Glucose, Bld: 317 mg/dL — ABNORMAL HIGH (ref 65–99)

## 2017-12-13 LAB — PROCALCITONIN: Procalcitonin: 13.65 ng/mL

## 2017-12-13 MED ORDER — AMIODARONE LOAD VIA INFUSION
150.0000 mg | Freq: Once | INTRAVENOUS | Status: AC
  Administered 2017-12-13: 150 mg via INTRAVENOUS
  Filled 2017-12-13: qty 83.34

## 2017-12-13 MED ORDER — SODIUM CHLORIDE 0.9 % IV SOLN
10.0000 mg/h | INTRAVENOUS | Status: DC
  Administered 2017-12-13: 10 mg/h via INTRAVENOUS
  Filled 2017-12-13 (×2): qty 10

## 2017-12-13 MED ORDER — AMIODARONE IV BOLUS ONLY 150 MG/100ML: 150.0000 mg | Freq: Once | INTRAVENOUS | Status: DC

## 2017-12-13 MED ORDER — AMIODARONE HCL IN DEXTROSE 360-4.14 MG/200ML-% IV SOLN: 30.0000 mg/h | INTRAVENOUS | Status: DC

## 2017-12-13 MED ORDER — PHENYLEPHRINE HCL-NACL 10-0.9 MG/250ML-% IV SOLN
0.0000 ug/min | INTRAVENOUS | Status: DC
  Administered 2017-12-13: 80 ug/min via INTRAVENOUS
  Administered 2017-12-13: 20 ug/min via INTRAVENOUS
  Administered 2017-12-13: 100 ug/min via INTRAVENOUS
  Filled 2017-12-13 (×4): qty 250

## 2017-12-13 MED ORDER — AMIODARONE HCL IN DEXTROSE 360-4.14 MG/200ML-% IV SOLN
60.0000 mg/h | INTRAVENOUS | Status: DC
  Administered 2017-12-13: 60 mg/h via INTRAVENOUS
  Filled 2017-12-13: qty 200

## 2017-12-13 MED ORDER — SODIUM CHLORIDE 0.9 % IV SOLN
1.0000 g | Freq: Once | INTRAVENOUS | Status: AC
  Administered 2017-12-13: 1 g via INTRAVENOUS
  Filled 2017-12-13: qty 10

## 2017-12-13 MED ORDER — MIDAZOLAM BOLUS VIA INFUSION (WITHDRAWAL LIFE SUSTAINING TX)
5.0000 mg | INTRAVENOUS | Status: DC | PRN
  Administered 2017-12-13: 10 mg via INTRAVENOUS
  Filled 2017-12-13: qty 20

## 2017-12-13 MED ORDER — AMIODARONE IV BOLUS ONLY 150 MG/100ML
150.0000 mg | Freq: Once | INTRAVENOUS | Status: AC
  Administered 2017-12-13: 150 mg via INTRAVENOUS
  Filled 2017-12-13: qty 100

## 2017-12-14 ENCOUNTER — Encounter (HOSPITAL_COMMUNITY): Payer: Self-pay | Admitting: Vascular Surgery

## 2017-12-14 LAB — TYPE AND SCREEN
ABO/RH(D): O POS
Antibody Screen: POSITIVE
DAT, IgG: NEGATIVE
UNIT DIVISION: 0
Unit division: 0

## 2017-12-14 LAB — BPAM RBC
BLOOD PRODUCT EXPIRATION DATE: 201907112359
BLOOD PRODUCT EXPIRATION DATE: 201907112359
ISSUE DATE / TIME: 201906120455
UNIT TYPE AND RH: 5100
Unit Type and Rh: 5100

## 2017-12-15 LAB — CULTURE, RESPIRATORY

## 2017-12-15 LAB — CULTURE, RESPIRATORY W GRAM STAIN

## 2017-12-16 LAB — AEROBIC/ANAEROBIC CULTURE W GRAM STAIN (SURGICAL/DEEP WOUND)

## 2017-12-16 LAB — AEROBIC/ANAEROBIC CULTURE (SURGICAL/DEEP WOUND)

## 2017-12-17 LAB — CULTURE, BLOOD (ROUTINE X 2)
Culture: NO GROWTH
Culture: NO GROWTH

## 2017-12-19 ENCOUNTER — Encounter (HOSPITAL_COMMUNITY): Payer: Self-pay | Admitting: Vascular Surgery

## 2017-12-19 NOTE — Addendum Note (Signed)
Addendum  created 12/19/17 1603 by Kipp Brood, MD   Intraprocedure Event edited, Intraprocedure Staff edited

## 2018-01-01 NOTE — Discharge Summary (Signed)
[facility] Discharge Summary   Patient ID:  [name] MRN: [id number] DOB/AGE: 03-12-1946 72 y.o.  Admit date: 19-Dec-2017 Discharge date: 12/27/2017 Date of Surgery: 12/22/2017 Surgeon: Surgeon(s): Early, Kristen Loader, MD  Admission Diagnosis: PVD (peripheral vascular disease) [facility]) [I73.9]  Discharge Diagnoses:  PVD (peripheral vascular disease) (HCC) [I73.9]  Secondary Diagnoses: Past Medical History:  Diagnosis Date  . Anemia    hx  . Anxiety   . Carotid artery occlusion   . CHF (congestive heart failure) (HCC)   . Constipation   . Coronary artery disease    a. s/p NSTEMI 02/2009 - PCI LCX with Xience DES. Otherwise branch vessel and Dist. RCA dzs. NL EF.  b. 2013: cath showing patency of LCx stent and mild nonobstructive dz in LAD; normal LV function  . Diabetic neuropathy with neurologic complication (HCC)   . Fall from slipping on wet surface Jan. 7, 2014   Right stump bleeding from fall  . GERD (gastroesophageal reflux disease)   . Goiter   . Hernia, umbilical   . History of blood transfusion   . History of right below knee amputation (HCC)   . Hyperlipidemia    Mixed  . Hypertension    Unspecified  . Kidney stones   . Loose, teeth    has loose bridge and two loose teeth holding it  . Myocardial infarction (HCC)   . Peripheral vascular disease (HCC)    unspecified, a. s/p L CEA b. s/p B fem-pop bypass c. s/p R BKA  . PONV (postoperative nausea and vomiting)   . Proliferative diabetic retinopathy of right eye (HCC)   . Raynaud disease   . Stones in the urinary tract   . Subclavian steal syndrome   . Tobacco abuse    Remote  . Type II diabetes mellitus (HCC)   . UTI (lower urinary tract infection)   . UTI (urinary tract infection)   . Vitamin D deficiency     Procedure(s): GROIN DEBRIDEMENT  Discharged Condition: Deceased  HPI: 72 y/o female known to our office secondary to diffuse peripheral arterial occlusive disease reported to  our office for follow up.  Since her last visit she has had amputation of her right distal index finger for gangrenous changes.  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  Her chief complaint is rest pain in her left foot with no new open ulceration.  ABI 10/31/2017: DATA:  Monophasic flow throughout her above-knee below-knee popliteal bypass on the left which was done with reversed small saphenous vein.  It is possible that this is even occluded with collateral flow being visualized.  Ankle arm index is 0.4 to   She has progressive critical limb ischemia with rest pain.  Angiography was recommended with possible intervention.       Hospital Course:  [name] is a 72 y.o. female is S/P Left Procedure(s): 12-19-17 Abdominal aortogram, Stent, left superficial femoral artery, Balloon angioplasty, left peroneal and tibioperoneal trunk   Post procedure Dr. Edilia Bo examined the patient.  She developed a hematoma in the right groin after femoral sheath was pulled. Pressure was held for 45 min.  She also had some transient hypotenison. IVF bolus was given and patient was placed in trendelenburg.  Groin is soft now and BP improved but I think that the safest thing is to admit her overnight for observation.   Patient seen 8:29 am with stable right groin, soft without hematoma expansion.  Non labored breathing and stable BP  93/62 tolerating breakfast.  At 12:08 pm we were called to the patients room secondary to recurrent  pseudoaneurysm in the right groin area.  Per nursing staff she became diaphoretic and hypotensive.  Pressure is being held manually over the right groin.  Hematoma has expanded into the abdominal area and surrounding groin.  Dr. Arbie Cookey came to bedside and it was decided we will go to the OR for exploration of the right groin.   Type and screen and cross ordered 1L bolus saline given and fluids at 75cc/hr once bolus completed for hypotension Pressures came  up. Hyperglycemia sliding scale ordered Zofran given.   Code blue response team called 12:51 pm Technical Description:  - CPR performance duration:  Not performed   - Was defibrillation or cardioversion used? No   - Was external pacer placed? No  - Was patient intubated? Yes    Patient was emergently transported to the OR.     11/25/2017  Procedure:  Evacuation and exploration of right groin with closure of puncture of right femoral graft by Dr. Arbie Cookey.  Acute blood loss anemia-pt received 4U PRBC's in OR.  She was supported with phenylephrine to maintain BP.  She had monophasic DP doppler signal post surgery.  She was transferred to the ICU intubated and sedated.  CCM consulted to manage the ventilator.   Dr. Arbie Cookey discussed the difficulty of the patients actual blood pressure secondary to  severe upper extremity arterial occlusive disease in addition to lower extremity issues.   We recommended checking her femoral pulse as a guide line.  She has a bounding left femoral pulse on exam 11/15/2017.    POD#1  Doppler signals found left popliteal and peroneal. Right groin significant ecchymosis, softer, painful to touch. Toes and finger dusky appearance with coolness to touch.  JP drain maintained right groin Started PCA for pain. Dsky fingers and left foot, now off Levophed.    POD# Levophed was restarted secondary to hypotension Plan to wean off Levophed over the next 2 hours.  BP adjustments not driven per central line, check left femoral pulse and monitor UO.   JP maintained with 160 cc output right groin Right groin with significant ecchymosis, soft without recurrent hematoma. Left peroneal doppler signal, popliteal signal.  Left foot pink and warm.  POD#3  Pt reports feeling better  RN raise concerns with swallow problems.  Speech consult ordered.  Will adjust diet depending on findings.  Discussed with RN her concerns with transferring her to stepdown given no good way to  monitor BP  Stable H/H  Somewhat better Cr.  UOP remains adequate, 3.1 L  Reportedly recurrent hypoglycemia hence D5 1/2 NS MIVF.  Will also hold Lantus for now until diet resumed.  Keep in ICU for now   POD#4    Neuro: mental status intact, some garbled speech per family ? Discussed possible CVA work-up: husband prefers to defer for now given transfer from bed to CT table CT likely needed ? keep PCA per pt's preference  Pulm:  ? IS x 10 q1 hour,  ? Wean [place] oxygen as tolerated  CV:  Unable to measure accurate BP, using clinical surrogates (Mental status and urine output)  GI: ? Pt needs to eat.  Ok to bring food into hospital ? May need Megace.  Family will try to encourage eating ? Once PO better can stop D5 1/2 NS ? Husband requesting Dexilant: Protonix ordered (therapeutic substitution)  FEN: ? NS @10  cc/hr for PCA ?  D5 1/2 NS @ 50 cc/hr to maintain BG ? Ensure  RENAL: ? UOP >3 L over last two days ? D/C foley: husband and pt ok with such  HEME: ? Stable H/H 8.9/25.7 (9.4/27.8), suspect this is hemodilution  VASC: ? R groin/RLQ fullness unchanged so probably residual clot/seroma ? JP still 270 cc/day ? Would prefer to pull soon as possible due to proximity to ABF limb but volume still too high.  Hopefully tomorrow. ? L Peroneal still dopplerable  POD# 5 D/c JP drain R groin Unable to doppler L peroneal on exam this morning, patient however denies changes to LLE including sensation and pain; continue to monitor GI consulted due to coffee ground emesis Continue d5 1/2 NS at 50cc/hr Continue to monitor UO Hgb9.3 this morning; more recommendations pending GI evaluation  GI consult 11/27/2017 consult due to a large episode of coffee grounds and dark brown emesis early this morning and recurrent smaller episodes of brown vomiting with small coffee grounds in them per nursing.  Impression/plan:  Coffee grounds likely due to erosive esophagitis and would  manage conservatively. Change to Protonix drip today and if stable transition to Protonix 40 mg IV Q 12 hours tomorrow. NPO due to recurrent vomiting. I do not think she needs an EGD at this time.  POD#6 Dr. Arbie Cookey discussed: Severely deconditioned.  Had a long discussion with patient and her husband this morning.  She is rather lethargic but does respond.  Splane concern regarding her initial debilitated state even before this major event.  Explained the risk of progression to multisystem organ failure.  She is having coffee-ground emesis and is being treated appropriately for this.  No evidence of active bleeding so do not know if there is any benefit from endoscopy at this time.  I did explain the critical importance of getting her at least to the edge of the bed and dangling from a pulmonary standpoint.  She does have continued anemia and will transfer refuse her 1 unit packed cells.  She did have a dark stool last night with Dulcolax suppository and will repeat this this morning.  I did frankly discuss with the husband that this could be something that she could not survive due to her frail state at her baseline.  Fortunately no cardiac difficulty and she has had improvement in her renal function back to baseline.  POD#7 Nausea is resolved.  Will begin clears and advance as tolerated.  H&H stable after 1 unit packed red blood cells.  Replace potassium.  Have asked critical care medicine to consult for assistance and management  POD#8 Stable overall.  Will transfer to 4 E. stepdown today.  Needs to mobilize with physical therapy. Groin incision healing.  Less tenderness.  Left foot looks very good with gangrenous changes on the tips of her toes WOC Nurse wound consult note Reason for Consult: sacrum Wound type: Deep tissue injury;sacrum  Discovered on 11/27/17 Pressure Injury POA: Yes Measurement: 10cm x 9cm x 0.1cm  Wound bed:70% dark purple non blanchable tissue with superficial skin peeling on  the left side of the wound bed, serous filled bulla in the apex of the gluteal clef and some darkening of the tissue that extends onto the right buttock.  All consistent with DTI.   POD#9  Doppler signal left peroneal.  Tip of toes with dry gangrene.  Minimal ankle active range of motion. Right groin softer, ecchymosis Continued PCA for pain  POD#10  Continue to encourage PO intake, if no  improvement may need nutritional support via feeding tube. Ischemia to fingers and toes resolving since off pressors.   Continue dressing changes to right groin.  Superficial skin tear. Doppler signals primary peroneal, DP/PT  POD#11  Dr. Edilia Bo saw patient I updated the patient, her husband, and her daughter.  She had 2 episodes of nausea this morning.  Abdomen is quiet with hypoactive bowel sounds.  I checked an abdominal x-ray which shows persistent ileus.   I think the patient clearly needs nutrition.  I am reluctant to start tube feeds given her persistent ileus with some nausea and risk for aspiration.  This reason I will start TNA and have a PICC line inserted.  I have discussed this with the patient and they are agreeable with this plan.  POD#12 Tolerating TPN with out N/V since yesterday.  States she ate a little yesterday as well.   Hypokalemia 3.0 will hopefully correct with TPN.  Will observe.   Continue current treatment. Future plan discharge to SNF once stable.  Dr. Adele Dan input I have interviewed the patient and examined the patient. I agree with the findings by the PA. NUTRITION: She still has some ileus based on her x-ray yesterday and physical exam.  She also had nausea yesterday but this has improved.  This reason I started her on TNA as I was worried about the risk of aspiration if we started tube feeds.  Hopefully, her p.o. intake will improve and ultimately she can be weaned off of TNA and not require a feeding tube. The right groin wound is still of concern.  She will  need continued meticulous wound care.  POD#13 Nutrition: TPN was started over the weekend.  This is currently going through a central line which has been in place for 2 weeks.  I spoke with radiology this morning, and they plan on placing a dual lumen PICC line.  Once the TPN has been converted to the PICC line, the central line will be removed.  I discussed the importance of enteral nutrition.  The patient is going to try to eat a biscuit from [place] today.  GI bleed: No active evidence of bleed.  Continue Protonix.  Antiplatelet medication is on hold.  SCDs for DVT prophylaxis  POD#14 D/c central line once TPN converted to PICC Marinol started tod ay to help with appetite Continue TPN SW to assist with dispo to [facility]   POD#15 Nutrition:  Severe malnutrition, Currently getting TPN via PICC.  If she is able to remain without nausea and can have a bowel movement, I will consider placing a NG tube for tube feed, possibly tomorrow or Friday.  This should help with placement.  Marinol started yesterday to help with appetite  Pain:  Morphine PCA will be discontinued as it causes her to be drowsy.  SHe will start with PO Demerol with IV as needed.  Continue with meticulous care of right groin.  SHe is at risk for future complications.  Colace and senna added to assist with bowel function.  She does not want Mirilax.  POD#16 Positive BM last night PCA d/c yesterday.  Minimal PO demerol used yesterday without complaint of increase pain.  Nurses state the patient was more alert now that she was off constant pain medication. TPN for nutritional support PICC line in place SSI changed to AC/HS Speech eval canceled yesterday will try again today  [facility] denying patient due to the TPN.  CSW explained that once CSW receives other offers from surrounding  SNF [facility]iener can look at them prior to discharge. CSW to find SNF that are able to take TPN and follow up with family. Pending  SNF offers. HGB 7.5 yesterday will redraw H/H to recheck. Cr stable, WBC 10.2 12/06/2017  After Dr. Myra Gianotti reviewed patient Acute blood loss anemia:  Hb down today.  WIll transfuse 2 units prbc today.  Guiac all stools Will place feeding tube in am.   Continue TPN Working on placement  POD#17  Pending discussions NG tube is not accepted for any facility in [place], [facility] and [facility] accept will accept TPN and her husband will not accept theses facilities.   S/P transfusion 2 units 12/07/2017 will obtain HGB 10.6 this am.  POD#18  Nutrition:  For NG today and initiation of enteral feeds.  If she tolerates this, I will stop TPN tomorrow.  Leave PICC in for now.  Added Ensure shakes, which she will try.  Continue marinol. GI bleed:  2 units yesterday, Hb pending today.  May need repeat GI eval. Wound care for sacral decub Keep right groin dry with prn dressing changes.  At high risk for complications given nutritional status.  POD# 19 Right groin now with drainage.  Discussed the likely need for return to OR secondary to concerns  that aortobifem may be at risk.  POD# 20 s/p repair R CFA with increasing drainage 18 Days Post-Op   WBC increased today; pharmacy consulted to dose vanc/zosyn; high risk for ABF bypass infection; check CXR GI: Hgb stable; continue to monitor Perfusing L foot; toes are well demarcated; continue doppler checks L peroneal Will adjust NG feeding rate given N/V overnight Keep NPO past midnight in case patient will need groin debridement in OR  Rapid response called  Tachycardiac, Tachypnea, Febrile, Increased WOB 10:59 pm  Recorded event By RN Herbert Deaner Initial Focused Assessment: Called by charge RN. Per RNs, patient is now febrile, has been tachycardiac, and is tachypneic. Upon arrival, patient is clammy and diaphoretic, neuro exam A/O x 2, skin is cool to touch, RN able to doppler pulses in LLE. RR in the 40-45, increased work of breathing, sats are >  93% on 4L (patient is requiring more oxygen that she previously did). Per RN, orders for not to check BP Patient appears lethargic, but per RN this is how the patient is at night. Blood sugar is normal. I am concerned that the patient will tire out from a respiratory perspective. Abdomen is very tight and distended, tender to touch. RN called MD prior to my arrival, MD ordered transfer to ICU and MD came to assess patient.   Interventions: - STAT LABS (BMP, CBC, LACTIC) - STAT CXR - ABG ? Will defer to MD for now.  - MD arrived and ordered KUB  - Patient vomited during manipulation while getting xrays, HOB raised, suctioned, NGT placed by MD. - Oxygen increased to [facility] for oxygenation saturations 86%, improved to 92%.  Plan of Care: - [facility] per Dr. Darrick Penna.  Dr. Darrick Penna called to bedside: Assessment and plan:  Chest xray right lower lobe infiltrate Abdomen: multiple dilated loops of small and large bowel  A: 1. Ileus unknown etiology.  Will stop tube feeds and continue NG for now  2.  Tachycardia fever most likely aspiration pneumonia from vomiting event yesterday or early sepsis related to her groin.  Continue broad spectrum antibiotics.  Currently oxygenating ok.  3. Sepsis will give fluid bolus now and resume IV fluids for  hydration as BUN/Cr also show hypovolemia  4. Transfer to ICU  Husband updated by phone    POD# 21 Dr. Arbie Cookey assessment and plan: I have examined the patient, reviewed and agree with above.  Very difficult situation with progressive overall clinical deterioration.  Cannot tolerate tube feeds.  KUB yesterday showed ileus versus small bowel obstruction.  We will keep NG tube to suction.  Has fat necrosis drainage from right groin with 3 cm area of obvious skin necrosis.  White blood cell count increased.  Had a long discussion with the patient's husband this morning.  Explained that her overall weakened status makes it very difficult for  recovery.  Also explained that if she does have involvement of her aortofemoral bypass grafting that the definitive treatment would be removal of the graft which she obviously could not survive.  Will take to the operating room this afternoon for debridement and probable VAC placement.  Understands her critical condition and also discussed with the husband we may need to begin discussion of comfort care.  12/25/2017 409 pm OR PROCEDURE: Debridement of skin and subcutaneous tissue and right groin and VAC placement  Post procedure discussion Dr. Arbie Cookey and family: Patient had an attempt at extubation in the operating room.  Was making very poor respiratory effort.  Then had severe hypotension and had increase on need of Synephrine and levo fed and actually some epinephrine as well.  Had brief less than 1 minute CPR to circulate agents.  Maintain saturations throughout this.  Was transferred to the intensive care unit on a vent.  Currently hemodynamically stable.  Have consulted CCM for management of this critically ill patient.  I had a long discussion with the patient's husband, son and daughter and brother.  Explained that she does appear to have progression to multisystem failure with pulmonary and GI issues.  They agreed to medication for blood pressure support but no more CPR if she has sudden deterioration tonight.  They all fully understand that we may be approaching the decision for comfort care only.  Will reassess depending on her response to extubation attempts  POD#22  Dr. Bosie Helper assessment and plan:  Remains intubated.  On low-dose levo fed. Does become agitated at time but otherwise minimal response. Abdomen continued moderate distention. No change in hand hands and left foot ischemia. VAC dressing in place and functioning.  Appreciate critical care medicine's assistance regarding potential vent weaning I had a long discussion with the patient's husband and son present this  morning.  They understand the critical illness with Ms. [name].  I have suggested continued level of care for today and see how she does with potentially weaning from the vent.  If she deteriorates we will not increase her level of care.  We will also consider terminal wean tomorrow if she is showing no evidence of improvement.   POD#23 Dr. Bosie Helper assessment and plan: Events of last night noted.  Atrial fibrillation with rapid ventricular response in the 1 40-1 60 range.  Converted with amiodarone.  Unresponsive on vent this morning.  Right hand markedly ischemic and mottled.  Left hand less so.  Toes of left well.  Abdomen tense and distended.  Discussion this morning with the patient's husband, brother, son and daughter present.  Feel that she has no chance for meaningful recovery and nearly no chance for survival with multisystem failure.  They wish to withdraw support with comfort measures only.  I discussed with critical care medicine who will coordinate terminal  wean.  1:25 pm Pt extubated per withdrawal of life sustaining orders protocol. Family at bedside and understanding and ready.     Significant Diagnostic Studies: CBC Lab Results  Component Value Date   WBC 28.0 (H) 12/12/2017   HGB 7.0 (L) 12/12/2017   HCT 22.8 (L) 12/12/2017   MCV 90.1 12/12/2017   PLT 252 12/12/2017    BMET    Component Value Date/Time   NA 137 12/23/2017 0206   K 4.5 12/27/2017 0206   CL 105 12/03/2017 0206   CO2 16 (L) 12/22/2017 0206   GLUCOSE 317 (H) 12/17/2017 0400   BUN 70 (H) 12/04/2017 0206   CREATININE 2.35 (H) 12/16/2017 0206   CALCIUM 6.4 (LL) 12/18/2017 0206   GFRNONAA 20 (L) 12/09/2017 0206   GFRAA 23 (L) 12/05/2017 0206   COAG Lab Results  Component Value Date   INR 1.60 (H) 12/11/2015   INR 1.07 08/19/2015   INR 1.01 06/05/2015     Disposition:  Discharge to :Deceased Discharge Instructions    Call MD for:  redness, tenderness, or signs of infection (pain,  swelling, bleeding, redness, odor or green/yellow discharge around incision site)   Complete by:  As directed    Call MD for:  severe or increased pain, loss or decreased feeling  in affected limb(s)   Complete by:  As directed    Call MD for:  temperature >100.5   Complete by:  As directed    Resume previous diet   Complete by:  As directed      Allergies as of 12/27/2017      Reactions   Statins Other (See Comments)   Leg myalgias   Acetaminophen Nausea Only, Other (See Comments)   Does not tolerate well, nausea   Ceftriaxone Itching   Pt has tolerated amoxicillin and cephalexin in the past.   Codeine Nausea And Vomiting   Erythromycin Nausea And Vomiting   Hydrocodone-acetaminophen Nausea And Vomiting   Hydromorphone Nausea And Vomiting   Ibuprofen Other (See Comments)   Unspecified  Per Pt "Does not tolerate well"   Propoxyphene Hcl Nausea And Vomiting   Shellfish-derived Products Other (See Comments)   "pt does not eat" Unspecified reaction      Medication List    TAKE these medications   ACIDOPHILUS PO Take 1 capsule by mouth daily.   ALPRAZolam 0.25 MG tablet Commonly known as:  XANAX Take 0.25 mg by mouth daily as needed for anxiety.   ALTACE 1.25 MG capsule Generic drug:  ramipril Take 1.25 mg by mouth daily.   aspirin EC 81 MG tablet Take 1 tablet (81 mg total) by mouth daily. What changed:    how much to take  additional instructions   calcium carbonate 500 MG chewable tablet Commonly known as:  TUMS - dosed in mg elemental calcium Chew 1,000 mg by mouth daily as needed for indigestion or heartburn.   clopidogrel 75 MG tablet Commonly known as:  PLAVIX Take 1 tablet (75 mg total) by mouth daily.   clopidogrel 75 MG tablet Commonly known as:  PLAVIX Take 1 tablet (75 mg total) by mouth daily.   DEXILANT 60 MG capsule Generic drug:  dexlansoprazole Take 60 mg by mouth 2 (two) times daily.   ergocalciferol 50000 units capsule Commonly known  as:  VITAMIN D2 Take 50,000 Units by mouth 3 (three) times a week.   furosemide 40 MG tablet Commonly known as:  LASIX Take 40 mg by mouth 2 (two) times daily.  iron polysaccharides 150 MG capsule Commonly known as:  NIFEREX Take 600 mg by mouth daily.   isosorbide mononitrate 30 MG 24 hr tablet Commonly known as:  IMDUR Take 30 mg by mouth daily.   meperidine 50 MG tablet Commonly known as:  DEMEROL Take 50 mg by mouth every 4 (four) hours as needed for severe pain.   metFORMIN 500 MG tablet Commonly known as:  GLUCOPHAGE Take 500 mg by mouth 2 (two) times daily with a meal. Notes to patient:  HOLD FOR 48 HRS AFTER PROCEDURE   metoprolol succinate 50 MG 24 hr tablet Commonly known as:  TOPROL-XL Take 1 tablet (50 mg total) by mouth daily.   nitroGLYCERIN 0.4 MG SL tablet Commonly known as:  NITROSTAT Place 1 tablet (0.4 mg total) under the tongue every 5 (five) minutes as needed. For chest pain   omega-3 acid ethyl esters 1 g capsule Commonly known as:  LOVAZA Take 2 g by mouth 2 (two) times daily.   prochlorperazine 10 MG tablet Commonly known as:  COMPAZINE Take 10 mg by mouth every 6 (six) hours as needed for nausea or vomiting.   rosuvastatin 10 MG tablet Commonly known as:  CRESTOR Take 10 mg by mouth every Monday.   sodium chloride 0.65 % Soln nasal spray Commonly known as:  OCEAN Place 2-4 sprays into both nostrils every 4 (four) hours as needed (for nose bleeds).   sulfamethoxazole-trimethoprim 800-160 MG tablet Commonly known as:  BACTRIM DS,SEPTRA DS Take 1 tablet by mouth 2 (two) times daily.   TRESIBA FLEXTOUCH 200 UNIT/ML Sopn Generic drug:  Insulin Degludec Inject 16 Units into the muscle every morning.       Signed: [name] 12/19/2017, 12:11 PM

## 2018-01-01 NOTE — Progress Notes (Signed)
Per notes patient now comfort care- CSW signing off for placement needs at this time  Please reconsult if further needs arise  Burna Sis, LCSW Clinical Social Worker [phone]

## 2018-01-01 NOTE — Procedures (Signed)
Extubation Procedure Note  Patient Details:   Name: [name] DOB: Apr 19, 1946 MRN: [id number]   Airway Documentation:    Vent end date: 12/18/2017 Vent end time: 1321   Evaluation  O2 sats: currently acceptable Complications: No apparent complications Patient did tolerate procedure well. Bilateral Breath Sounds: Clear, Diminished   Pt extubated per withdrawal of life sustaining orders protocol. Family at bedside and understanding and ready.      Loyal Jacobson [facility] 12/18/2017, 1:25 PM

## 2018-01-01 NOTE — Progress Notes (Signed)
eLink Physician-Brief Progress Note Patient Name: [name] DOB: 1946/06/05 MRN: [id number]   Date of Service  12/28/17  HPI/Events of Note  Multiple issues: 1. AFIB with RVR, Ca++ = 6.4 and Troponin = 0.28. Patient is on Norepinephrine IV infusion, therefore, can't B-Block. Patient is allergic to ibuprofen, therefore, can't use ASA.  eICU Interventions  Will order: 1. Bolus with Amiodarone 150 mg IV over 10 minutes now.  2. Replace Ca++. 3. Phenylephrine IV infusion. Titrate to MAP > 65. 4. Wean Norepinephrine IV infusion off as tolerated.          Edona Schreffler Eugene 28-Dec-2017, 3:17 AM

## 2018-01-01 NOTE — Progress Notes (Signed)
eLink Physician-Brief Progress Note Patient Name: [name] DOB: 1946-01-11 MRN: [id number]   Date of Service  12/25/2017  HPI/Events of Note  AFIB with RVR - Patient converted to NSR prior to 3rd Amiodarone bolus.   eICU Interventions  Will D/C third Amiodarone bolus.      Intervention Category Major Interventions: Arrhythmia - evaluation and management  Sommer,Steven Eugene 12/14/2017, 4:50 AM

## 2018-01-01 NOTE — Progress Notes (Signed)
3 different results on CBG testing capillary and off PICC line. Random glucose sent to lab.

## 2018-01-01 NOTE — Progress Notes (Signed)
SLP Cancellation Note  Patient Details Name: [name] MRN: [id number] DOB: 28-Nov-1945   Cancelled treatment:       Reason Eval/Treat Not Completed: Medical issues which prohibited therapy, pt remains on vent. Will f/u as able.   Maxcine Ham December 19, 2017, 8:03 AM  Maxcine Ham, M.A. CCC-SLP [phone]

## 2018-01-01 NOTE — Progress Notes (Signed)
Visited with this patient and her family via her nurse.  Met husband, daughter and son and brother of the patient.  All are expressing their satisfaction at the life she has lived and at the care they are receiving.  Husband states that they have been married for 50 years and they have went everywhere and done many things together.  He lets me know that they have minister friends and are pretty well taken care of. He shared a story with me about how it took his wife about three years before she would go out with him.  They thanked me for coming in but didn't have any needs they stated.  Would be happy to return as needed.  Thank you to the medical team that have been caring for this patient and her family.    January 01, 2018 1320  Clinical Encounter Type  Visited With Patient and family together  Visit Type Initial;Spiritual support;Psychological support;Critical Care

## 2018-01-01 NOTE — Progress Notes (Signed)
CRITICAL VALUE ALERT  Critical Value: Troponin 0.28                         Calcium 6.4 Date & Time Notied:  12/22/2017 0320  Provider Notified: Dr Arsenio Loader  Orders Received/Actions taken: Orders received and enacted

## 2018-01-01 NOTE — Progress Notes (Signed)
Nutrition Follow-up  Nutrition Brief Note  Per MD and CSW pt is now on comfort measures only.  No nutrition interventions warranted at this time; if pt status or goals of care change, please consult RD.  Sherrine Maples, Dietetic Intern

## 2018-01-01 NOTE — Progress Notes (Signed)
eLink Physician-Brief Progress Note Patient Name: [name] DOB: 05-Jul-1945 MRN: [id number]   Date of Service  12/12/2017  HPI/Events of Note  AFIB with RVR - Ventricular rate remains = 140-160.  eICU Interventions  Will order: 1. Bolus with Amiodarone 150 mg IV over 10 minutes now. (Third bolus)     Intervention Category Major Interventions: Arrhythmia - evaluation and management  Sommer,Steven Eugene 12/29/2017, 4:36 AM

## 2018-01-01 NOTE — Progress Notes (Signed)
eLink Physician-Brief Progress Note Patient Name: [name] DOB: 22-Jul-1945 MRN: [id number]   Date of Service  12-22-2017  HPI/Events of Note  AFIB with RVR - Ventricular rate = 152. Patient is currently on Norepinephrine IV infusion.   eICU Interventions  Will order: 1. Amiodarone IV load and  Infusion. 2. Cycle Troponin  3. 12 Lead EKG STAT. 4. Send AM labs early.      Intervention Category Major Interventions: Arrhythmia - evaluation and management  Sommer,Steven Eugene December 22, 2017, 1:34 AM

## 2018-01-01 NOTE — Progress Notes (Signed)
Patient into AFIB with RVR ventricular rates 150-180. Dr Arsenio Loader updated. Orders received and implemented. Amiodarone bolus and infusion given and infusing. Stat EKG complete. Troponin sent to lab. Also received call from the lab that PRBC will take longer than expected r/t antigen.

## 2018-01-01 NOTE — Progress Notes (Signed)
Random glucose from lab 317. Insulin coverage given according to lab result.

## 2018-01-01 NOTE — Progress Notes (Signed)
Patient ID: [name], female   DOB: [birth year], 72 y.o.   MRN: [id number] Events of last night noted.  Atrial fibrillation with rapid ventricular response in the 1 40-1 60 range.  Converted with amiodarone.  Unresponsive on vent this morning.  Right hand markedly ischemic and mottled.  Left hand less so.  Toes of left well.  Abdomen tense and distended.  Discussion this morning with the patient's husband, brother, son and daughter present.  Feel that she has no chance for meaningful recovery and nearly no chance for survival with multisystem failure.  They wish to withdraw support with comfort measures only.  I discussed with critical care medicine who will coordinate terminal wean.

## 2018-01-01 NOTE — Consult Note (Signed)
WOC Nurse wound consult note WOC Nurse wound follow up  Visit made, level of care has just been changed over to Palliative, withdraw care. RN, and patient's family present and do not wish for pt to be seen. Will update WOC team and sign off. Barnett Hatter, RN-C, WTA-C, OCA Wound Treatment Associate Ostomy Care Associate

## 2018-01-01 DEATH — deceased

## 2018-02-20 ENCOUNTER — Encounter (HOSPITAL_COMMUNITY): Payer: Medicare Other

## 2018-02-20 ENCOUNTER — Ambulatory Visit: Payer: Medicare Other | Admitting: Vascular Surgery

## 2018-04-16 IMAGING — CR DG ABD PORTABLE 1V
1 series · 1 of 1 positions shown · non-contrast
Comparison: None.

CLINICAL DATA: Status post nasogastric catheter placement

EXAM:
PORTABLE ABDOMEN - 1 VIEW

[AP]
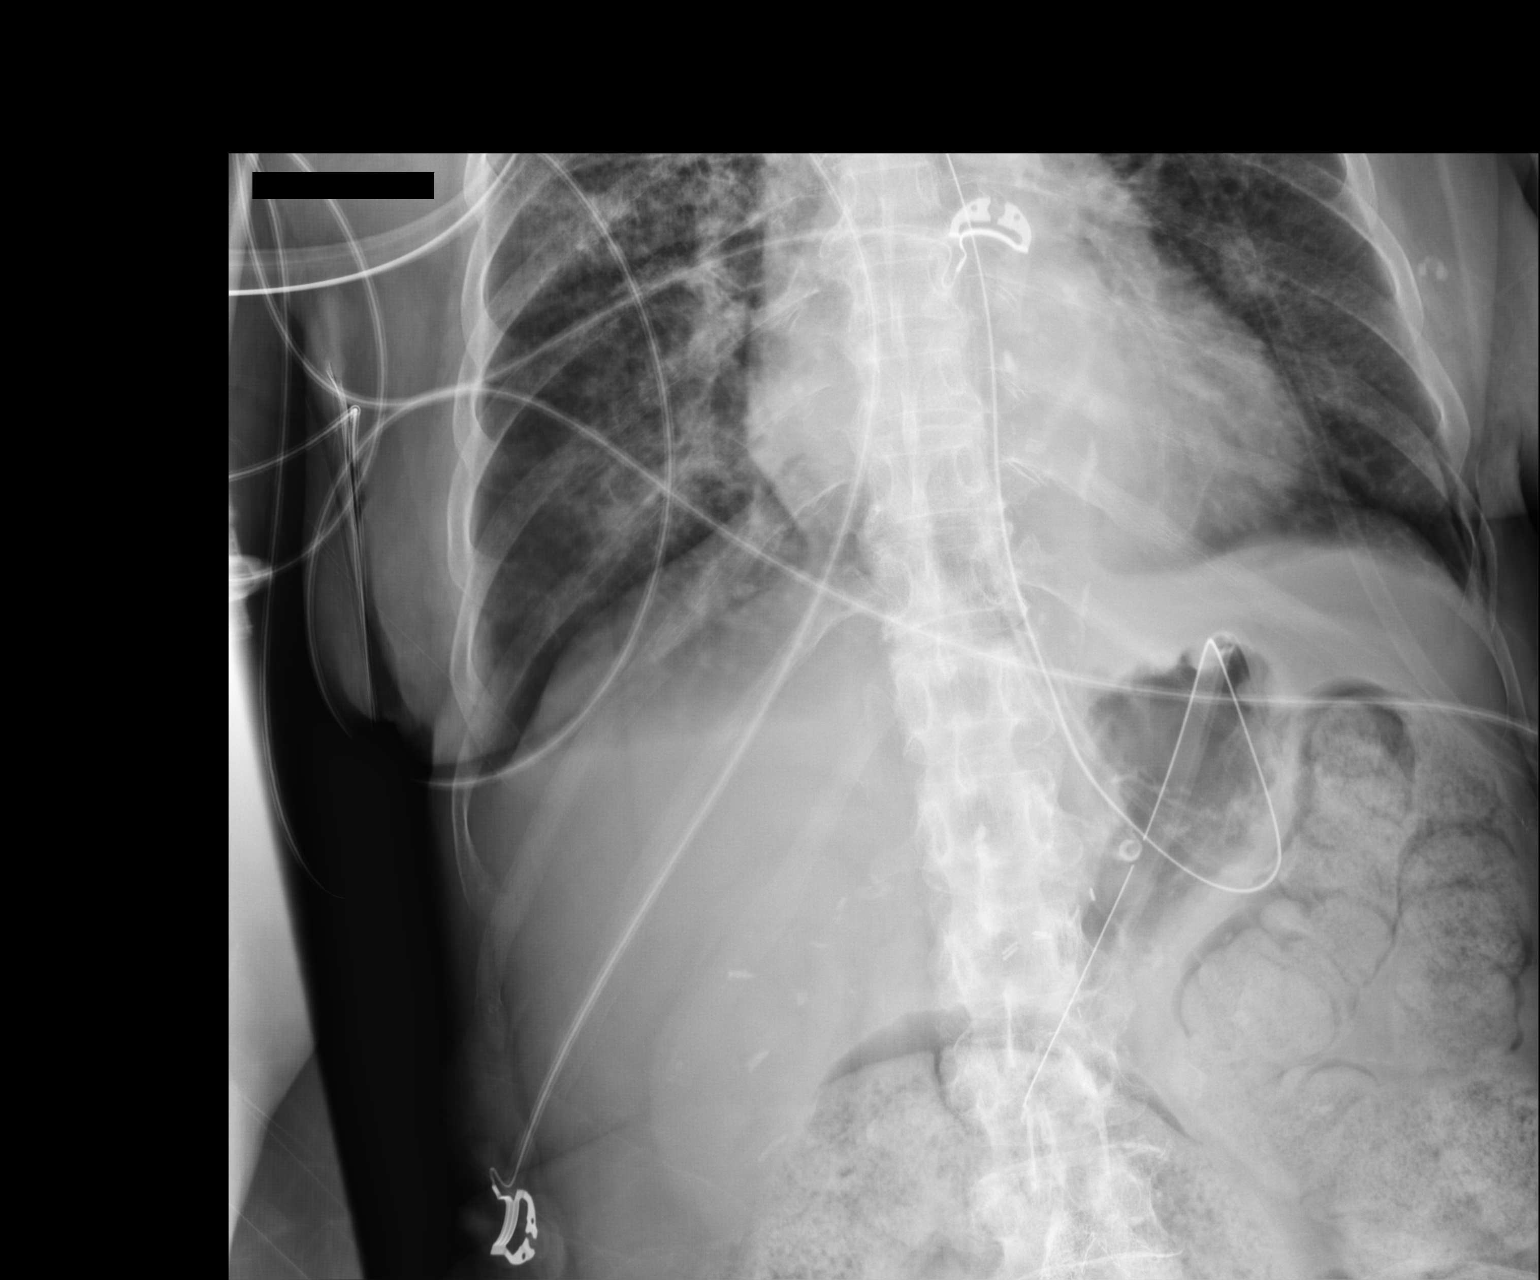

[1 of 1 positions shown; findings below may reference images not displayed]

FINDINGS: Nasogastric catheter is noted coiled within the stomach. Fecal
material is noted throughout the colon. No free air is seen.
IMPRESSION: Nasogastric catheter within the stomach.
